# Patient Record
Sex: Female | Born: 1955
Health system: Southern US, Community
[De-identification: ages and names within clinical notes are randomized; demographics above are authoritative.]

## PROBLEM LIST (undated history)

## (undated) DIAGNOSIS — C50519 Malignant neoplasm of lower-outer quadrant of unspecified female breast: Secondary | ICD-10-CM

## (undated) DIAGNOSIS — Z87442 Personal history of urinary calculi: Secondary | ICD-10-CM

## (undated) DIAGNOSIS — C50919 Malignant neoplasm of unspecified site of unspecified female breast: Secondary | ICD-10-CM

## (undated) DIAGNOSIS — E119 Type 2 diabetes mellitus without complications: Secondary | ICD-10-CM

## (undated) DIAGNOSIS — R42 Dizziness and giddiness: Secondary | ICD-10-CM

## (undated) DIAGNOSIS — Z9882 Breast implant status: Secondary | ICD-10-CM

## (undated) DIAGNOSIS — C7800 Secondary malignant neoplasm of unspecified lung: Secondary | ICD-10-CM

## (undated) DIAGNOSIS — E7800 Pure hypercholesterolemia, unspecified: Secondary | ICD-10-CM

## (undated) DIAGNOSIS — R112 Nausea with vomiting, unspecified: Secondary | ICD-10-CM

## (undated) DIAGNOSIS — I10 Essential (primary) hypertension: Secondary | ICD-10-CM

## (undated) DIAGNOSIS — Z9889 Other specified postprocedural states: Secondary | ICD-10-CM

## (undated) DIAGNOSIS — M1990 Unspecified osteoarthritis, unspecified site: Secondary | ICD-10-CM

## (undated) HISTORY — DX: Essential (primary) hypertension: I10

## (undated) HISTORY — PX: WISDOM TOOTH EXTRACTION: SHX21

## (undated) HISTORY — PX: BREAST BIOPSY: SHX20

## (undated) HISTORY — DX: Malignant neoplasm of lower-outer quadrant of unspecified female breast: C50.519

## (undated) HISTORY — DX: Type 2 diabetes mellitus without complications: E11.9

## (undated) SURGERY — ENDOBRONCHIAL ULTRASOUND (EBUS)
Anesthesia: General

---
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
## 1997-11-21 ENCOUNTER — Ambulatory Visit (HOSPITAL_COMMUNITY): Admission: RE | Admit: 1997-11-21 | Discharge: 1997-11-21 | Payer: Self-pay | Admitting: Obstetrics and Gynecology

## 1998-10-03 ENCOUNTER — Other Ambulatory Visit: Admission: RE | Admit: 1998-10-03 | Discharge: 1998-10-03 | Payer: Self-pay | Admitting: Obstetrics and Gynecology

## 1999-11-05 ENCOUNTER — Other Ambulatory Visit: Admission: RE | Admit: 1999-11-05 | Discharge: 1999-11-05 | Payer: Self-pay | Admitting: Obstetrics and Gynecology

## 2000-01-13 ENCOUNTER — Encounter: Payer: Self-pay | Admitting: Emergency Medicine

## 2000-01-13 ENCOUNTER — Emergency Department (HOSPITAL_COMMUNITY): Admission: EM | Admit: 2000-01-13 | Discharge: 2000-01-13 | Payer: Self-pay | Admitting: Emergency Medicine

## 2000-11-18 ENCOUNTER — Other Ambulatory Visit: Admission: RE | Admit: 2000-11-18 | Discharge: 2000-11-18 | Payer: Self-pay | Admitting: Obstetrics and Gynecology

## 2002-01-18 ENCOUNTER — Other Ambulatory Visit: Admission: RE | Admit: 2002-01-18 | Discharge: 2002-01-18 | Payer: Self-pay | Admitting: Obstetrics and Gynecology

## 2003-02-26 ENCOUNTER — Other Ambulatory Visit: Admission: RE | Admit: 2003-02-26 | Discharge: 2003-02-26 | Payer: Self-pay | Admitting: Obstetrics and Gynecology

## 2004-09-14 ENCOUNTER — Other Ambulatory Visit: Admission: RE | Admit: 2004-09-14 | Discharge: 2004-09-14 | Payer: Self-pay | Admitting: Obstetrics and Gynecology

## 2005-11-17 ENCOUNTER — Ambulatory Visit: Payer: Self-pay | Admitting: Family Medicine

## 2007-09-05 ENCOUNTER — Ambulatory Visit: Payer: Self-pay | Admitting: Family Medicine

## 2008-01-05 ENCOUNTER — Ambulatory Visit: Payer: Self-pay | Admitting: Unknown Physician Specialty

## 2009-01-09 ENCOUNTER — Ambulatory Visit: Payer: Self-pay | Admitting: Family Medicine

## 2011-07-15 ENCOUNTER — Other Ambulatory Visit: Payer: Self-pay | Admitting: Obstetrics and Gynecology

## 2011-07-15 DIAGNOSIS — N63 Unspecified lump in unspecified breast: Secondary | ICD-10-CM

## 2011-07-15 DIAGNOSIS — Z803 Family history of malignant neoplasm of breast: Secondary | ICD-10-CM

## 2011-07-28 ENCOUNTER — Other Ambulatory Visit: Payer: Self-pay | Admitting: Obstetrics and Gynecology

## 2011-07-28 ENCOUNTER — Ambulatory Visit
Admission: RE | Admit: 2011-07-28 | Discharge: 2011-07-28 | Disposition: A | Discharge status: Home / Self Care | Payer: BC Managed Care – PPO | Source: Ambulatory Visit | Attending: Obstetrics and Gynecology | Admitting: Obstetrics and Gynecology

## 2011-07-28 DIAGNOSIS — N63 Unspecified lump in unspecified breast: Secondary | ICD-10-CM

## 2011-07-28 DIAGNOSIS — Z803 Family history of malignant neoplasm of breast: Secondary | ICD-10-CM

## 2011-07-29 ENCOUNTER — Other Ambulatory Visit: Payer: Self-pay | Admitting: Obstetrics and Gynecology

## 2011-07-29 ENCOUNTER — Ambulatory Visit
Admission: RE | Admit: 2011-07-29 | Discharge: 2011-07-29 | Disposition: A | Discharge status: Home / Self Care | Payer: BC Managed Care – PPO | Source: Ambulatory Visit | Attending: Obstetrics and Gynecology | Admitting: Obstetrics and Gynecology

## 2011-07-29 DIAGNOSIS — C50911 Malignant neoplasm of unspecified site of right female breast: Secondary | ICD-10-CM

## 2011-07-29 DIAGNOSIS — Z803 Family history of malignant neoplasm of breast: Secondary | ICD-10-CM

## 2011-07-29 DIAGNOSIS — N63 Unspecified lump in unspecified breast: Secondary | ICD-10-CM

## 2011-07-31 HISTORY — PX: BREAST BIOPSY: SHX20

## 2011-07-31 HISTORY — PX: PORTACATH PLACEMENT: SHX2246

## 2011-08-02 ENCOUNTER — Other Ambulatory Visit: Payer: Self-pay | Admitting: Obstetrics and Gynecology

## 2011-08-02 ENCOUNTER — Ambulatory Visit
Admission: RE | Admit: 2011-08-02 | Discharge: 2011-08-02 | Disposition: A | Discharge status: Home / Self Care | Payer: BC Managed Care – PPO | Source: Ambulatory Visit | Attending: Obstetrics and Gynecology | Admitting: Obstetrics and Gynecology

## 2011-08-02 DIAGNOSIS — R921 Mammographic calcification found on diagnostic imaging of breast: Secondary | ICD-10-CM

## 2011-08-02 DIAGNOSIS — C50911 Malignant neoplasm of unspecified site of right female breast: Secondary | ICD-10-CM

## 2011-08-02 DIAGNOSIS — R928 Other abnormal and inconclusive findings on diagnostic imaging of breast: Secondary | ICD-10-CM

## 2011-08-02 MED ORDER — GADOBENATE DIMEGLUMINE 529 MG/ML IV SOLN
16.0000 mL | Freq: Once | INTRAVENOUS | Status: AC | PRN
  Administered 2011-08-02: 16 mL via INTRAVENOUS

## 2011-08-06 ENCOUNTER — Ambulatory Visit (INDEPENDENT_AMBULATORY_CARE_PROVIDER_SITE_OTHER): Payer: BC Managed Care – PPO | Admitting: Surgery

## 2011-08-06 ENCOUNTER — Encounter (INDEPENDENT_AMBULATORY_CARE_PROVIDER_SITE_OTHER): Payer: Self-pay | Admitting: Surgery

## 2011-08-06 VITALS — BP 156/108 | HR 64 | Temp 97.9°F | Resp 18 | Ht 62.5 in | Wt 185.5 lb

## 2011-08-06 DIAGNOSIS — C50511 Malignant neoplasm of lower-outer quadrant of right female breast: Secondary | ICD-10-CM | POA: Insufficient documentation

## 2011-08-06 DIAGNOSIS — C50519 Malignant neoplasm of lower-outer quadrant of unspecified female breast: Secondary | ICD-10-CM

## 2011-08-06 DIAGNOSIS — C50919 Malignant neoplasm of unspecified site of unspecified female breast: Secondary | ICD-10-CM

## 2011-08-06 DIAGNOSIS — C50911 Malignant neoplasm of unspecified site of right female breast: Secondary | ICD-10-CM

## 2011-08-06 HISTORY — DX: Malignant neoplasm of lower-outer quadrant of unspecified female breast: C50.519

## 2011-08-06 NOTE — Progress Notes (Signed)
Patient ID: Kimberly [name], female   DOB: May 17, 1956, 55 y.o.   MRN: [id number]  Chief Complaint  Patient presents with  . Breast Cancer    new eval for right breast cancer    Referred by Dr Manson Passey and Dr Henderson Cloud  HPI Kimberly [name] is a 55 y.o. female.  She recently found a lump in the right arm pit area. She is quite worried about this. He then noted an area in the right breast near the lower midline slightly to the outer quadrant that was hard. She also felt there was some thickening of her breast. Subsequent to this she had a mammogram which shows an abnormality that looks malignant. The lower outer quadrant and an axillary mass. These have both been biopsied and are invasive ductal carcinoma, receptor positive, HER-2/neu negative. Other than that she's had no other prior breast problems. Her mother was treated for breast cancer has recently died apparently of some hematological complications of chemotherapy. She comes in today to evaluate wait and discuss possible treatment options. If she needs to have chemotherapy or radiation she wishes to do that near her home and [facility] [place] HPI  Past Medical History  Diagnosis Date  . Diabetes mellitus   . Hypertension   . Cancer of lower-outer quadrant of female breast 08/06/2011    Past Surgical History  Procedure Date  . Cesarean section [id number]  . Wisdom tooth extraction     History reviewed. No pertinent family history.  Social History History  Substance Use Topics  . Smoking status: Never Smoker   . Smokeless tobacco: Never Used  . Alcohol Use: No    Allergies  Allergen Reactions  . Sulfa Antibiotics Rash    Current Outpatient Prescriptions  Medication Sig Dispense Refill  . ALPRAZolam (XANAX) 0.25 MG tablet         Review of Systems Review of Systems  Constitutional: Negative for fever, chills and unexpected weight change.  HENT: Negative for hearing loss, congestion, sore throat, trouble swallowing and voice  change.   Eyes: Negative for visual disturbance.  Respiratory: Positive for cough. Negative for wheezing.   Cardiovascular: Negative for chest pain, palpitations and leg swelling.  Gastrointestinal: Negative for nausea, vomiting, abdominal pain, diarrhea, constipation, blood in stool, abdominal distention and anal bleeding.  Genitourinary: Negative for hematuria, vaginal bleeding and difficulty urinating.  Musculoskeletal: Negative for arthralgias.  Skin: Negative for rash and wound.  Neurological: Negative for seizures, syncope and headaches.  Hematological: Positive for adenopathy. Does not bruise/bleed easily.  Psychiatric/Behavioral: Negative for confusion.    Blood pressure 156/108, pulse 64, temperature 97.9 F (36.6 C), temperature source Temporal, resp. rate 18, height 5' 2.5" (1.588 m), weight 185 lb 8 oz (84.142 kg).  Physical Exam Physical Exam  Constitutional: She is oriented to person, place, and time. She appears well-developed and well-nourished. No distress.  HENT:  Head: Normocephalic and atraumatic.  Mouth/Throat: Oropharynx is clear and moist.  Eyes: EOM are normal. Pupils are equal, round, and reactive to light. Right eye exhibits no discharge. No scleral icterus.  Neck: Normal range of motion. Neck supple. No JVD present. No tracheal deviation present. No thyromegaly present.  Cardiovascular: Normal rate, regular rhythm, normal heart sounds and intact distal pulses.  Exam reveals no gallop and no friction rub.   No murmur heard. Pulmonary/Chest: Effort normal and breath sounds normal. No respiratory distress. She has no wheezes. She has no rales.  Abdominal: Soft. Bowel sounds are normal. She exhibits no distension and no  mass. There is no tenderness. There is no rebound and no guarding.  Musculoskeletal: She exhibits no edema and no tenderness.  Neurological: She is alert and oriented to person, place, and time.  Skin: Skin is dry. She is not diaphoretic. No  erythema. No pallor.  Psychiatric: She has a normal mood and affect. Her behavior is normal.  Breast: There is mild edema of the right breast but no erythema. There is a 4cm hard mass about the 7:30 position of the right. Otherwise the right is normal as is the left: Lymphatics: There is a hard mass in the right anterior axilla. There is no left axillary or bilateral supraclavicular adenopathy  Data Reviewed I reviewed the mammogram films and reports with the radiologist as well as the MRI films and reports. I have reviewed the pathology slides with the pathologist.  Assessment   Clinical Stage II Right breast cancer, receptor Positive Family history of breast cancer     Plan    I had a long talk with the patient and her family. I think that she has at least a stage II possibly stage III right breast cancer. It was receptor positive. I've explained to her the diagnosis and implications. We have talked about the possible it for genetic counseling since her mother had breast cancer. We also talked at length about lumpectomy mastectomy axillary dissections plastic surgical reconstruction  and preoperative chemotherapy.  I think it would be appropriate to obtain both preoperative chemotherapy and radiation oncology appointments. We will also try to arrange for surgical consultation for possible immediate reconstruction. I will also check to see about getting genetic counseling done.       Jose Corvin J 08/06/2011, 10:11 AM

## 2011-08-06 NOTE — Patient Instructions (Signed)
We will try to arrange for both a medical and the radiation therapy oncology visit as soon as we can. We will also arrange for her plastic surgeon evaluation. We will check to see if a genetic counselor is available locally or whether you will need to come here for one.  Please call my office if you have any questions or concerns about scheduling these tests and consultations

## 2011-08-12 ENCOUNTER — Ambulatory Visit: Payer: Self-pay | Admitting: Internal Medicine

## 2011-08-12 ENCOUNTER — Ambulatory Visit: Payer: Self-pay | Admitting: Oncology

## 2011-08-13 ENCOUNTER — Telehealth (INDEPENDENT_AMBULATORY_CARE_PROVIDER_SITE_OTHER): Payer: Self-pay | Admitting: Surgery

## 2011-08-13 ENCOUNTER — Other Ambulatory Visit (INDEPENDENT_AMBULATORY_CARE_PROVIDER_SITE_OTHER): Payer: Self-pay | Admitting: Surgery

## 2011-08-13 DIAGNOSIS — C50911 Malignant neoplasm of unspecified site of right female breast: Secondary | ICD-10-CM

## 2011-08-13 NOTE — Telephone Encounter (Signed)
Spoke with the radiation oncologist and plans are for neo-adjuvant chemo, so will hold on surgery unless patient decides differently

## 2011-08-17 ENCOUNTER — Ambulatory Visit: Payer: Self-pay | Admitting: Oncology

## 2011-08-20 ENCOUNTER — Ambulatory Visit: Payer: Self-pay | Admitting: Surgery

## 2011-08-23 ENCOUNTER — Ambulatory Visit: Payer: Self-pay | Admitting: Surgery

## 2011-08-31 ENCOUNTER — Ambulatory Visit: Payer: Self-pay | Admitting: Internal Medicine

## 2011-08-31 ENCOUNTER — Ambulatory Visit: Payer: Self-pay | Admitting: Oncology

## 2011-09-03 LAB — CBC CANCER CENTER
Basophil #: 0.1 x10 3/mm (ref 0.0–0.1)
Basophil %: 2.4 %
Eosinophil #: 0.2 x10 3/mm (ref 0.0–0.7)
Lymphocyte #: 1.1 x10 3/mm (ref 1.0–3.6)
MCHC: 33.1 g/dL (ref 32.0–36.0)
MCV: 85.9 fL (ref 80–100)
Monocyte #: 0.3 x10 3/mm (ref 0.0–0.7)
Neutrophil %: 32.8 %
Platelet: 313 x10 3/mm (ref 150–440)
RBC: 4.21 10*6/uL (ref 3.80–5.20)
RDW: 12.4 % (ref 11.5–14.5)
WBC: 2.6 x10 3/mm — ABNORMAL LOW (ref 3.6–11.0)

## 2011-09-10 LAB — CBC CANCER CENTER
Basophil #: 0.1 x10 3/mm (ref 0.0–0.1)
Eosinophil #: 0 x10 3/mm (ref 0.0–0.7)
Eosinophil %: 0.3 %
Lymphocyte #: 1.8 x10 3/mm (ref 1.0–3.6)
Lymphocyte %: 14 %
MCV: 86 fL (ref 80–100)
Monocyte #: 0.6 x10 3/mm (ref 0.0–0.7)
Monocyte %: 4.8 %
Neutrophil #: 10.2 x10 3/mm — ABNORMAL HIGH (ref 1.4–6.5)
Neutrophil %: 80.4 %
Platelet: 378 x10 3/mm (ref 150–440)
RBC: 4.19 10*6/uL (ref 3.80–5.20)
RDW: 13.1 % (ref 11.5–14.5)
WBC: 12.7 x10 3/mm — ABNORMAL HIGH (ref 3.6–11.0)

## 2011-09-10 LAB — BASIC METABOLIC PANEL
Anion Gap: 8 (ref 7–16)
BUN: 11 mg/dL (ref 7–18)
Calcium, Total: 8.6 mg/dL (ref 8.5–10.1)
Chloride: 98 mmol/L (ref 98–107)
Creatinine: 0.85 mg/dL (ref 0.60–1.30)
EGFR (African American): >60
EGFR (Non-African Amer.): >60
Glucose: 285 mg/dL — ABNORMAL HIGH (ref 65–99)
Osmolality: 278 (ref 275–301)

## 2011-09-17 LAB — CBC CANCER CENTER
Basophil #: 0.1 x10 3/mm (ref 0.0–0.1)
Basophil %: 4.7 %
Eosinophil #: 0 x10 3/mm (ref 0.0–0.7)
HCT: 35.8 % (ref 35.0–47.0)
Lymphocyte #: 0.7 x10 3/mm — ABNORMAL LOW (ref 1.0–3.6)
Lymphocyte %: 51.2 %
MCHC: 33.5 g/dL (ref 32.0–36.0)
MCV: 85.7 fL (ref 80–100)
Neutrophil #: 0.5 x10 3/mm — ABNORMAL LOW (ref 1.4–6.5)
Platelet: 369 x10 3/mm (ref 150–440)
RDW: 12.6 % (ref 11.5–14.5)
WBC: 1.5 x10 3/mm — CL (ref 3.6–11.0)

## 2011-09-24 LAB — BASIC METABOLIC PANEL
Anion Gap: 11 (ref 7–16)
Calcium, Total: 8.9 mg/dL (ref 8.5–10.1)
Co2: 26 mmol/L (ref 21–32)
EGFR (Non-African Amer.): >60
Glucose: 248 mg/dL — ABNORMAL HIGH (ref 65–99)
Osmolality: 281 (ref 275–301)
Sodium: 136 mmol/L (ref 136–145)

## 2011-09-24 LAB — CBC CANCER CENTER
Basophil #: 0 x10 3/mm (ref 0.0–0.1)
Basophil %: 0.2 %
Eosinophil #: 0 x10 3/mm (ref 0.0–0.7)
HGB: 11.8 g/dL — ABNORMAL LOW (ref 12.0–16.0)
Lymphocyte #: 1.6 x10 3/mm (ref 1.0–3.6)
MCH: 29 pg (ref 26.0–34.0)
MCHC: 33.7 g/dL (ref 32.0–36.0)
MCV: 86.1 fL (ref 80–100)
Monocyte #: 1 x10 3/mm — ABNORMAL HIGH (ref 0.0–0.7)
Neutrophil %: 83.7 %
Platelet: 321 x10 3/mm (ref 150–440)
RBC: 4.05 10*6/uL (ref 3.80–5.20)
RDW: 13.3 % (ref 11.5–14.5)
WBC: 16 x10 3/mm — ABNORMAL HIGH (ref 3.6–11.0)

## 2011-09-29 LAB — URINALYSIS, COMPLETE
Bilirubin,UR: NEGATIVE
Glucose,UR: 1000 mg/dL (ref 0–75)
Nitrite: POSITIVE
Protein: NEGATIVE

## 2011-09-30 LAB — CBC CANCER CENTER
Basophil #: 0 x10 3/mm (ref 0.0–0.1)
Eosinophil #: 0 x10 3/mm (ref 0.0–0.7)
Lymphocyte %: 4.2 %
Neutrophil %: 93.2 %
Platelet: 362 x10 3/mm (ref 150–440)
RBC: 4.02 10*6/uL (ref 3.80–5.20)
RDW: 14.4 % (ref 11.5–14.5)
WBC: 5.8 x10 3/mm (ref 3.6–11.0)

## 2011-09-30 LAB — BASIC METABOLIC PANEL
Anion Gap: 13 (ref 7–16)
BUN: 14 mg/dL (ref 7–18)
Calcium, Total: 8.3 mg/dL — ABNORMAL LOW (ref 8.5–10.1)
Chloride: 96 mmol/L — ABNORMAL LOW (ref 98–107)
Co2: 25 mmol/L (ref 21–32)
Creatinine: 0.95 mg/dL (ref 0.60–1.30)
EGFR (African American): >60
Potassium: 4.2 mmol/L (ref 3.5–5.1)
Sodium: 134 mmol/L — ABNORMAL LOW (ref 136–145)

## 2011-10-01 ENCOUNTER — Ambulatory Visit: Payer: Self-pay | Admitting: Internal Medicine

## 2011-10-01 ENCOUNTER — Ambulatory Visit: Payer: Self-pay | Admitting: Oncology

## 2011-10-01 LAB — COMPREHENSIVE METABOLIC PANEL
Albumin: 3.3 g/dL — ABNORMAL LOW (ref 3.4–5.0)
Alkaline Phosphatase: 144 U/L — ABNORMAL HIGH (ref 50–136)
Calcium, Total: 8.7 mg/dL (ref 8.5–10.1)
Co2: 26 mmol/L (ref 21–32)
EGFR (Non-African Amer.): >60
Glucose: 281 mg/dL — ABNORMAL HIGH (ref 65–99)
Osmolality: 282 (ref 275–301)
SGOT(AST): 11 U/L — ABNORMAL LOW (ref 15–37)
SGPT (ALT): 17 U/L
Sodium: 136 mmol/L (ref 136–145)
Total Protein: 6.5 g/dL (ref 6.4–8.2)

## 2011-10-01 LAB — CBC CANCER CENTER
Basophil #: 0 x10 3/mm (ref 0.0–0.1)
Basophil %: 0.6 %
Eosinophil #: 0 x10 3/mm (ref 0.0–0.7)
Eosinophil %: 0.2 %
HGB: 10 g/dL — ABNORMAL LOW (ref 12.0–16.0)
Lymphocyte #: 0.1 x10 3/mm — ABNORMAL LOW (ref 1.0–3.6)
Lymphocyte %: 3.1 %
MCH: 29.6 pg (ref 26.0–34.0)
MCHC: 34.7 g/dL (ref 32.0–36.0)
MCV: 85.2 fL (ref 80–100)
Monocyte #: 0.2 x10 3/mm (ref 0.0–0.7)
Monocyte %: 4.5 %
Platelet: 281 x10 3/mm (ref 150–440)
RBC: 3.39 10*6/uL — ABNORMAL LOW (ref 3.80–5.20)

## 2011-10-02 LAB — URINE CULTURE

## 2011-10-08 LAB — BASIC METABOLIC PANEL
Anion Gap: 12 (ref 7–16)
BUN: 9 mg/dL (ref 7–18)
Calcium, Total: 8.7 mg/dL (ref 8.5–10.1)
Chloride: 102 mmol/L (ref 98–107)
Co2: 24 mmol/L (ref 21–32)
Potassium: 3.8 mmol/L (ref 3.5–5.1)
Sodium: 138 mmol/L (ref 136–145)

## 2011-10-08 LAB — CBC CANCER CENTER
Basophil #: 0 x10 3/mm (ref 0.0–0.1)
Basophil %: 0.1 %
Eosinophil #: 0 x10 3/mm (ref 0.0–0.7)
Eosinophil %: 0.1 %
HCT: 32.5 % — ABNORMAL LOW (ref 35.0–47.0)
Lymphocyte %: 10.2 %
MCH: 29.3 pg (ref 26.0–34.0)
MCHC: 33.7 g/dL (ref 32.0–36.0)
MCV: 87 fL (ref 80–100)
Monocyte %: 5.3 %
Neutrophil #: 13.5 x10 3/mm — ABNORMAL HIGH (ref 1.4–6.5)
Neutrophil %: 84.3 %
Platelet: 392 x10 3/mm (ref 150–440)
RBC: 3.74 10*6/uL — ABNORMAL LOW (ref 3.80–5.20)
WBC: 16 x10 3/mm — ABNORMAL HIGH (ref 3.6–11.0)

## 2011-10-15 LAB — CBC CANCER CENTER
Basophil #: 0 x10 3/mm (ref 0.0–0.1)
Eosinophil #: 0 x10 3/mm (ref 0.0–0.7)
Eosinophil %: 0.9 %
HCT: 31.6 % — ABNORMAL LOW (ref 35.0–47.0)
Lymphocyte %: 26.4 %
MCHC: 33.5 g/dL (ref 32.0–36.0)
Monocyte %: 9 %
Neutrophil #: 1 x10 3/mm — ABNORMAL LOW (ref 1.4–6.5)
Platelet: 268 x10 3/mm (ref 150–440)
RBC: 3.63 10*6/uL — ABNORMAL LOW (ref 3.80–5.20)
RDW: 15.6 % — ABNORMAL HIGH (ref 11.5–14.5)
WBC: 1.5 x10 3/mm — CL (ref 3.6–11.0)

## 2011-10-18 LAB — URINALYSIS, COMPLETE
Bilirubin,UR: NEGATIVE
Glucose,UR: NEGATIVE mg/dL
Ketone: NEGATIVE
Leukocyte Esterase: NEGATIVE
Nitrite: NEGATIVE
Ph: 6
Specific Gravity: >=1.03

## 2011-10-20 LAB — URINE CULTURE

## 2011-10-22 LAB — CBC CANCER CENTER
Basophil %: 0.3 %
Eosinophil %: 0.1 %
HCT: 31.8 % — ABNORMAL LOW (ref 35.0–47.0)
Lymphocyte #: 0.9 x10 3/mm — ABNORMAL LOW (ref 1.0–3.6)
MCH: 29.5 pg (ref 26.0–34.0)
MCHC: 33.7 g/dL (ref 32.0–36.0)
MCV: 87.6 fL (ref 80–100)
Monocyte #: 1.6 x10 3/mm — ABNORMAL HIGH (ref 0.0–0.7)
Monocyte %: 7.5 %
Neutrophil #: 18.1 x10 3/mm — ABNORMAL HIGH (ref 1.4–6.5)
Platelet: 315 x10 3/mm (ref 150–440)
RDW: 16.9 % — ABNORMAL HIGH (ref 11.5–14.5)
WBC: 20.7 x10 3/mm — ABNORMAL HIGH (ref 3.6–11.0)

## 2011-10-29 ENCOUNTER — Ambulatory Visit: Payer: Self-pay | Admitting: Internal Medicine

## 2011-10-29 ENCOUNTER — Ambulatory Visit: Payer: Self-pay | Admitting: Oncology

## 2011-10-29 LAB — CBC CANCER CENTER
Basophil #: 0.1 x10 3/mm (ref 0.0–0.1)
Eosinophil #: 0 x10 3/mm (ref 0.0–0.7)
Eosinophil %: 0.4 %
Lymphocyte %: 14.5 %
Monocyte %: 8 %
Neutrophil #: 4 x10 3/mm (ref 1.4–6.5)
Neutrophil %: 75.1 %
Platelet: 578 x10 3/mm — ABNORMAL HIGH (ref 150–440)
RBC: 3.45 10*6/uL — ABNORMAL LOW (ref 3.80–5.20)
RDW: 17.4 % — ABNORMAL HIGH (ref 11.5–14.5)
WBC: 5.2 x10 3/mm (ref 3.6–11.0)

## 2011-11-05 LAB — CBC CANCER CENTER
Basophil #: 0.1 x10 3/mm (ref 0.0–0.1)
Basophil %: 2.1 %
Eosinophil #: 0 x10 3/mm (ref 0.0–0.7)
Eosinophil %: 0.7 %
HCT: 31.6 % — ABNORMAL LOW (ref 35.0–47.0)
Lymphocyte #: 0.7 x10 3/mm — ABNORMAL LOW (ref 1.0–3.6)
MCH: 30.3 pg (ref 26.0–34.0)
MCV: 89.4 fL (ref 80–100)
Monocyte #: 0.3 x10 3/mm (ref 0.0–0.7)
Platelet: 452 x10 3/mm — ABNORMAL HIGH (ref 150–440)
RDW: 18.4 % — ABNORMAL HIGH (ref 11.5–14.5)

## 2011-11-12 LAB — CBC CANCER CENTER
Basophil #: 0.1 x10 3/mm (ref 0.0–0.1)
Eosinophil #: 0.1 x10 3/mm (ref 0.0–0.7)
Eosinophil %: 3.6 %
HCT: 29.9 % — ABNORMAL LOW (ref 35.0–47.0)
Lymphocyte %: 26.6 %
MCHC: 33.8 g/dL (ref 32.0–36.0)
Neutrophil #: 1.7 x10 3/mm (ref 1.4–6.5)
Neutrophil %: 57.4 %
RBC: 3.3 10*6/uL — ABNORMAL LOW (ref 3.80–5.20)
RDW: 18.3 % — ABNORMAL HIGH (ref 11.5–14.5)

## 2011-11-19 LAB — CBC CANCER CENTER
Basophil %: 2.3 %
Eosinophil #: 0.1 x10 3/mm (ref 0.0–0.7)
Eosinophil %: 2.9 %
HCT: 28.7 % — ABNORMAL LOW (ref 35.0–47.0)
HGB: 9.7 g/dL — ABNORMAL LOW (ref 12.0–16.0)
Lymphocyte #: 0.6 x10 3/mm — ABNORMAL LOW (ref 1.0–3.6)
Lymphocyte %: 31.9 %
Monocyte %: 16 %
Neutrophil #: 1 x10 3/mm — ABNORMAL LOW (ref 1.4–6.5)
RBC: 3.15 10*6/uL — ABNORMAL LOW (ref 3.80–5.20)
RDW: 17.5 % — ABNORMAL HIGH (ref 11.5–14.5)
WBC: 2 x10 3/mm — CL (ref 3.6–11.0)

## 2011-11-29 ENCOUNTER — Ambulatory Visit: Payer: Self-pay | Admitting: Oncology

## 2011-11-29 ENCOUNTER — Ambulatory Visit: Payer: Self-pay | Admitting: Internal Medicine

## 2011-12-03 LAB — CBC CANCER CENTER
Basophil #: 0 x10 3/mm (ref 0.0–0.1)
Basophil %: 0.3 %
HCT: 33.2 % — ABNORMAL LOW (ref 35.0–47.0)
Lymphocyte %: 17.9 %
MCH: 30.1 pg (ref 26.0–34.0)
MCHC: 33.1 g/dL (ref 32.0–36.0)
Neutrophil #: 8.4 x10 3/mm — ABNORMAL HIGH (ref 1.4–6.5)
Neutrophil %: 75.1 %
Platelet: 589 x10 3/mm — ABNORMAL HIGH (ref 150–440)
RDW: 16.3 % — ABNORMAL HIGH (ref 11.5–14.5)
WBC: 11.1 x10 3/mm — ABNORMAL HIGH (ref 3.6–11.0)

## 2011-12-03 LAB — COMPREHENSIVE METABOLIC PANEL
Calcium, Total: 9.3 mg/dL (ref 8.5–10.1)
Chloride: 102 mmol/L (ref 98–107)
Co2: 25 mmol/L (ref 21–32)
Creatinine: 0.94 mg/dL (ref 0.60–1.30)
EGFR (African American): >60
Glucose: 196 mg/dL — ABNORMAL HIGH (ref 65–99)
Potassium: 4.2 mmol/L (ref 3.5–5.1)
SGPT (ALT): 26 U/L
Sodium: 137 mmol/L (ref 136–145)

## 2011-12-10 LAB — CBC CANCER CENTER
Basophil #: 0.1 x10 3/mm (ref 0.0–0.1)
Eosinophil %: 2.7 %
HCT: 31.7 % — ABNORMAL LOW (ref 35.0–47.0)
HGB: 10.8 g/dL — ABNORMAL LOW (ref 12.0–16.0)
Lymphocyte %: 22.2 %
MCHC: 34.1 g/dL (ref 32.0–36.0)
MCV: 91 fL (ref 80–100)
Monocyte #: 0.3 x10 3/mm (ref 0.2–0.9)
Monocyte %: 5 %
Neutrophil %: 68.2 %

## 2011-12-10 LAB — COMPREHENSIVE METABOLIC PANEL
Anion Gap: 7 (ref 7–16)
BUN: 10 mg/dL (ref 7–18)
Bilirubin,Total: 0.2 mg/dL (ref 0.2–1.0)
Creatinine: 0.7 mg/dL (ref 0.60–1.30)
EGFR (African American): >60
EGFR (Non-African Amer.): >60
Glucose: 160 mg/dL — ABNORMAL HIGH (ref 65–99)
Osmolality: 267 (ref 275–301)
Potassium: 4.3 mmol/L (ref 3.5–5.1)
Total Protein: 6.8 g/dL (ref 6.4–8.2)

## 2011-12-17 LAB — CBC CANCER CENTER
Basophil #: 0.1 x10 3/mm (ref 0.0–0.1)
Basophil %: 1.3 %
Eosinophil #: 0.1 x10 3/mm (ref 0.0–0.7)
Eosinophil %: 2.4 %
HCT: 32.3 % — ABNORMAL LOW (ref 35.0–47.0)
MCHC: 33.3 g/dL (ref 32.0–36.0)
MCV: 91 fL (ref 80–100)
Monocyte #: 0.3 x10 3/mm (ref 0.2–0.9)
Platelet: 418 x10 3/mm (ref 150–440)
RBC: 3.54 10*6/uL — ABNORMAL LOW (ref 3.80–5.20)

## 2011-12-17 LAB — COMPREHENSIVE METABOLIC PANEL
Alkaline Phosphatase: 85 U/L (ref 50–136)
BUN: 14 mg/dL (ref 7–18)
Bilirubin,Total: 0.2 mg/dL (ref 0.2–1.0)
Calcium, Total: 8.6 mg/dL (ref 8.5–10.1)
Chloride: 97 mmol/L — ABNORMAL LOW (ref 98–107)
Co2: 23 mmol/L (ref 21–32)
Creatinine: 0.79 mg/dL (ref 0.60–1.30)
EGFR (African American): >60
EGFR (Non-African Amer.): >60
Glucose: 168 mg/dL — ABNORMAL HIGH (ref 65–99)
Potassium: 4 mmol/L (ref 3.5–5.1)
SGOT(AST): 21 U/L (ref 15–37)
Sodium: 131 mmol/L — ABNORMAL LOW (ref 136–145)
Total Protein: 6.9 g/dL (ref 6.4–8.2)

## 2011-12-24 LAB — CBC CANCER CENTER
Basophil #: 0.1 x10 3/mm (ref 0.0–0.1)
Eosinophil #: 0.1 x10 3/mm (ref 0.0–0.7)
HCT: 32.5 % — ABNORMAL LOW (ref 35.0–47.0)
MCH: 31 pg (ref 26.0–34.0)
MCHC: 33.8 g/dL (ref 32.0–36.0)
Monocyte #: 0.2 x10 3/mm (ref 0.2–0.9)
Monocyte %: 5 %
Neutrophil #: 3.2 x10 3/mm (ref 1.4–6.5)
Neutrophil %: 68.2 %
Platelet: 420 x10 3/mm (ref 150–440)
RBC: 3.54 10*6/uL — ABNORMAL LOW (ref 3.80–5.20)
RDW: 16.3 % — ABNORMAL HIGH (ref 11.5–14.5)

## 2011-12-24 LAB — COMPREHENSIVE METABOLIC PANEL
Albumin: 3.6 g/dL (ref 3.4–5.0)
BUN: 13 mg/dL (ref 7–18)
Bilirubin,Total: 0.2 mg/dL (ref 0.2–1.0)
Calcium, Total: 9 mg/dL (ref 8.5–10.1)
Chloride: 100 mmol/L (ref 98–107)
Co2: 25 mmol/L (ref 21–32)
Creatinine: 0.85 mg/dL (ref 0.60–1.30)
Osmolality: 278 (ref 275–301)
Potassium: 4.1 mmol/L (ref 3.5–5.1)
Total Protein: 6.8 g/dL (ref 6.4–8.2)

## 2011-12-29 ENCOUNTER — Ambulatory Visit: Payer: Self-pay | Admitting: Internal Medicine

## 2011-12-29 ENCOUNTER — Ambulatory Visit: Payer: Self-pay | Admitting: Oncology

## 2011-12-31 LAB — CBC CANCER CENTER
Basophil %: 2.1 %
HCT: 33.9 % — ABNORMAL LOW (ref 35.0–47.0)
Lymphocyte #: 1.3 x10 3/mm (ref 1.0–3.6)
MCH: 30.6 pg (ref 26.0–34.0)
MCV: 91 fL (ref 80–100)
Monocyte #: 0.3 x10 3/mm (ref 0.2–0.9)
Monocyte %: 5.9 %
Neutrophil #: 2.7 x10 3/mm (ref 1.4–6.5)
RDW: 16.3 % — ABNORMAL HIGH (ref 11.5–14.5)
WBC: 4.4 x10 3/mm (ref 3.6–11.0)

## 2011-12-31 LAB — COMPREHENSIVE METABOLIC PANEL
Anion Gap: 11 (ref 7–16)
Co2: 25 mmol/L (ref 21–32)
Creatinine: 0.73 mg/dL (ref 0.60–1.30)
EGFR (African American): >60
EGFR (Non-African Amer.): >60
Glucose: 181 mg/dL — ABNORMAL HIGH (ref 65–99)
Osmolality: 274 (ref 275–301)
Potassium: 4.3 mmol/L (ref 3.5–5.1)
SGOT(AST): 15 U/L (ref 15–37)
Sodium: 135 mmol/L — ABNORMAL LOW (ref 136–145)
Total Protein: 6.8 g/dL (ref 6.4–8.2)

## 2012-01-07 LAB — COMPREHENSIVE METABOLIC PANEL
BUN: 10 mg/dL (ref 7–18)
Calcium, Total: 8.8 mg/dL (ref 8.5–10.1)
Chloride: 101 mmol/L (ref 98–107)
Creatinine: 0.7 mg/dL (ref 0.60–1.30)
EGFR (African American): >60
EGFR (Non-African Amer.): >60
Osmolality: 274 (ref 275–301)
SGOT(AST): 18 U/L (ref 15–37)
SGPT (ALT): 29 U/L
Sodium: 136 mmol/L (ref 136–145)
Total Protein: 6.4 g/dL (ref 6.4–8.2)

## 2012-01-07 LAB — CBC CANCER CENTER
Basophil %: 1.9 %
Eosinophil #: 0.1 x10 3/mm (ref 0.0–0.7)
Eosinophil %: 1.4 %
Lymphocyte #: 1.2 x10 3/mm (ref 1.0–3.6)
Lymphocyte %: 27.6 %
MCH: 30.7 pg (ref 26.0–34.0)
MCHC: 33.8 g/dL (ref 32.0–36.0)
MCV: 91 fL (ref 80–100)
Neutrophil #: 2.9 x10 3/mm (ref 1.4–6.5)
Neutrophil %: 63.5 %
RDW: 15.9 % — ABNORMAL HIGH (ref 11.5–14.5)

## 2012-01-09 LAB — CANCER ANTIGEN 27.29: CA 27.29: 21.5 U/mL (ref 0.0–38.6)

## 2012-01-14 LAB — COMPREHENSIVE METABOLIC PANEL
Albumin: 3.4 g/dL (ref 3.4–5.0)
Alkaline Phosphatase: 85 U/L (ref 50–136)
Anion Gap: 11 (ref 7–16)
BUN: 9 mg/dL (ref 7–18)
Calcium, Total: 8.5 mg/dL (ref 8.5–10.1)
Co2: 25 mmol/L (ref 21–32)
Creatinine: 0.79 mg/dL (ref 0.60–1.30)
EGFR (African American): >60
Glucose: 168 mg/dL — ABNORMAL HIGH (ref 65–99)
Osmolality: 275 (ref 275–301)
Potassium: 4.2 mmol/L (ref 3.5–5.1)
Total Protein: 6.6 g/dL (ref 6.4–8.2)

## 2012-01-14 LAB — CBC CANCER CENTER
Basophil #: 0.1 x10 3/mm (ref 0.0–0.1)
Eosinophil #: 0 x10 3/mm (ref 0.0–0.7)
Eosinophil %: 1.1 %
HCT: 32.3 % — ABNORMAL LOW (ref 35.0–47.0)
HGB: 10.9 g/dL — ABNORMAL LOW (ref 12.0–16.0)
MCHC: 33.8 g/dL (ref 32.0–36.0)
Monocyte #: 0.3 x10 3/mm (ref 0.2–0.9)
Neutrophil #: 2.7 x10 3/mm (ref 1.4–6.5)
Neutrophil %: 62.8 %
RDW: 16.4 % — ABNORMAL HIGH (ref 11.5–14.5)
WBC: 4.3 x10 3/mm (ref 3.6–11.0)

## 2012-01-20 LAB — URINALYSIS, COMPLETE
Bilirubin,UR: NEGATIVE
Ketone: NEGATIVE
Protein: NEGATIVE

## 2012-01-20 LAB — COMPREHENSIVE METABOLIC PANEL
Anion Gap: 8 (ref 7–16)
EGFR (Non-African Amer.): >60
Osmolality: 277 (ref 275–301)
Potassium: 4.7 mmol/L (ref 3.5–5.1)
SGOT(AST): 19 U/L (ref 15–37)
Sodium: 136 mmol/L (ref 136–145)

## 2012-01-20 LAB — CBC CANCER CENTER
Basophil #: 0.1 x10 3/mm (ref 0.0–0.1)
Basophil %: 1.6 %
Eosinophil #: 0 x10 3/mm (ref 0.0–0.7)
HCT: 36.1 % (ref 35.0–47.0)
HGB: 12.2 g/dL (ref 12.0–16.0)
Lymphocyte #: 1.1 x10 3/mm (ref 1.0–3.6)
Lymphocyte %: 33.8 %
MCHC: 33.8 g/dL (ref 32.0–36.0)
Monocyte #: 0.2 x10 3/mm (ref 0.2–0.9)
Monocyte %: 5.7 %
Neutrophil #: 2 x10 3/mm (ref 1.4–6.5)
RBC: 3.97 10*6/uL (ref 3.80–5.20)
RDW: 16.6 % — ABNORMAL HIGH (ref 11.5–14.5)
WBC: 3.4 x10 3/mm — ABNORMAL LOW (ref 3.6–11.0)

## 2012-01-22 LAB — URINE CULTURE

## 2012-01-27 ENCOUNTER — Ambulatory Visit (INDEPENDENT_AMBULATORY_CARE_PROVIDER_SITE_OTHER): Payer: BC Managed Care – PPO | Admitting: Surgery

## 2012-01-27 ENCOUNTER — Encounter (INDEPENDENT_AMBULATORY_CARE_PROVIDER_SITE_OTHER): Payer: Self-pay | Admitting: Surgery

## 2012-01-27 VITALS — BP 136/83 | HR 132 | Temp 97.6°F | Ht 62.0 in | Wt 195.2 lb

## 2012-01-27 DIAGNOSIS — C50519 Malignant neoplasm of lower-outer quadrant of unspecified female breast: Secondary | ICD-10-CM

## 2012-01-27 NOTE — Progress Notes (Signed)
Chief complaint: Followup after neoadjuvant chemotherapy  History of present illness: This patient presented in December with a stage II invasive ductal carcinoma of the right breast receptor positive. A port was placed and she is undergoing chemotherapy which she has now completed. She has a strong family history of breast cancer and because of the large size of her primary, and positive nodes, wishes to have a right aspect and with x-ray node dissection for her treatment for right breast cancer. In addition she very much desires a prophylactic left mastectomy again because of her family history and concerned about the development of a new cancer.  Past history family history review of systems are basically unchanged from her original visit. Of note is that she tolerated the chemotherapy well. She did have a urinary tract infection but that completely resolved.  Exam Vital signs:BP 136/83  Pulse 132  Temp(Src) 97.6 F (36.4 C) (Temporal)  Ht 5\' 2"  (1.575 m)  Wt 195 lb 3.2 oz (88.542 kg)  BMI 35.70 kg/m2  SpO2 97%  General: Patient alert awake oriented healthy-appearing GENERAL:  The patient is alert, oriented, and generally healthy-appearing, NAD. Mood and affect are normal.  HEENT:  The head is normocephalic, the eyes nonicteric, the pupils were round regular and equal. EOMs are normal. Pharynx normal. Dentition good.  NECK:  The neck is supple and there are no masses or thyromegaly.  LUNGS: Normal respirations and clear to auscultation.  HEART: Regular rhythm, with no murmurs rubs or gallops. Pulses are intact carotid dorsalis pedis and posterior tibial. No significant varicosities are noted.  BREASTS: No mass and no abnormality noted today  ABDOMEN: Soft, flat, and nontender. No masses or organomegaly is noted. No hernias are noted. Bowel sounds are normal.  EXTREMITIES:  Good range of motion, no edema.  Data Reviewed: Oncology notes  Imp: Good response to her  chemo  Plan: She wishes a right mastectomy so we discussed that including risks and complicatons and need for drains - and that Lymph nodes will need to be removed. She knows she will need to defer reconstruction. She also wants prophylactic mastectomy and this could have immediate reconstruction. I spoke with Dr Odis Luster and that had been the initial plan, defer Right reconstrauction but place tissue expander at this surgery for the left.  We will try to set this up for two or three weeks from now.

## 2012-01-29 ENCOUNTER — Ambulatory Visit: Payer: Self-pay | Admitting: Oncology

## 2012-01-29 ENCOUNTER — Ambulatory Visit: Payer: Self-pay | Admitting: Internal Medicine

## 2012-02-24 ENCOUNTER — Encounter (HOSPITAL_COMMUNITY): Payer: Self-pay | Admitting: Pharmacy Technician

## 2012-02-29 ENCOUNTER — Encounter (HOSPITAL_COMMUNITY)
Admission: RE | Admit: 2012-02-29 | Discharge: 2012-02-29 | Disposition: A | Discharge status: Home / Self Care | Payer: BC Managed Care – PPO | Source: Ambulatory Visit | Attending: Surgery | Admitting: Surgery

## 2012-02-29 ENCOUNTER — Encounter (HOSPITAL_COMMUNITY): Payer: Self-pay

## 2012-02-29 LAB — CBC
HCT: 35.2 % — ABNORMAL LOW (ref 36.0–46.0)
Hemoglobin: 11.7 g/dL — ABNORMAL LOW (ref 12.0–15.0)
MCHC: 33.2 g/dL (ref 30.0–36.0)
RBC: 4.06 MIL/uL (ref 3.87–5.11)

## 2012-02-29 LAB — BASIC METABOLIC PANEL
BUN: 10 mg/dL (ref 6–23)
Chloride: 100 mEq/L (ref 96–112)
GFR calc Af Amer: >90 mL/min (ref >90)
Potassium: 4.7 mEq/L (ref 3.5–5.1)
Sodium: 135 mEq/L (ref 135–145)

## 2012-02-29 LAB — SURGICAL PCR SCREEN: MRSA, PCR: NEGATIVE

## 2012-02-29 NOTE — Pre-Procedure Instructions (Signed)
19 Kimberly [name]  02/29/2012   Your procedure is scheduled on:  Tuesday July 9  Report to [facility] at 7:30 AM.  Call this number if you have problems the morning of surgery: [phone]   Remember:   Do not eat food:After Midnight.  May have clear liquids:until Midnight .  Clear liquids include soda, tea, black coffee, apple or grape juice, broth.  Take these medicines the morning of surgery with A SIP OF WATER: Xanax if needed   Do not wear jewelry, make-up or nail polish.  Do not wear lotions, powders, or perfumes. You may wear deodorant.  Do not shave 48 hours prior to surgery. Men may shave face and neck.  Do not bring valuables to the hospital.  Contacts, dentures or bridgework may not be worn into surgery.  Leave suitcase in the car. After surgery it may be brought to your room.  For patients admitted to the hospital, checkout time is 11:00 AM the day of discharge.   Patients discharged the day of surgery will not be allowed to drive home.  Name and phone number of your driver: NA  Special Instructions: CHG Shower Use Special Wash: 1/2 bottle night before surgery and 1/2 bottle morning of surgery.   Please read over the following fact sheets that you were given: Pain Booklet, Coughing and Deep Breathing and Surgical Site Infection Prevention

## 2012-03-01 ENCOUNTER — Ambulatory Visit: Payer: Self-pay | Admitting: Oncology

## 2012-03-01 ENCOUNTER — Telehealth (INDEPENDENT_AMBULATORY_CARE_PROVIDER_SITE_OTHER): Payer: Self-pay | Admitting: General Surgery

## 2012-03-01 NOTE — Telephone Encounter (Signed)
KATHY FROM SHORT STAY CALLED TO REQUEST ORDERS BE ENTERED INTO EPIC FOR BREAST SURGERY ON PT Kimberly [name]/MR [id number].  SURGERY DATE IS 03-07-12/ COORDINATE WITH DR. Odis Luster GY/ [phone]//SHORT STAY

## 2012-03-01 NOTE — Progress Notes (Signed)
Spoke with Kendell Bane at Dr Tenna Child office requesting orders for surgery.  She stated she will put a message in for him.

## 2012-03-06 ENCOUNTER — Telehealth (INDEPENDENT_AMBULATORY_CARE_PROVIDER_SITE_OTHER): Payer: Self-pay

## 2012-03-06 ENCOUNTER — Other Ambulatory Visit (INDEPENDENT_AMBULATORY_CARE_PROVIDER_SITE_OTHER): Payer: Self-pay | Admitting: Surgery

## 2012-03-06 MED ORDER — CHLORHEXIDINE GLUCONATE 4 % EX LIQD: 1.0000 "application " | Freq: Once | CUTANEOUS | Status: DC

## 2012-03-06 MED ORDER — CEFAZOLIN SODIUM-DEXTROSE 2-3 GM-% IV SOLR
2.0000 g | INTRAVENOUS | Status: DC
  Filled 2012-03-06: qty 50

## 2012-03-06 NOTE — Progress Notes (Signed)
Spoke with Sarah at [facility] about needing consent order entered in EPIC. She stated she would let him know.

## 2012-03-06 NOTE — Telephone Encounter (Signed)
Kimberly [name] needs an order put in for obtaining consent for tomorrow's surgery.  I paged Dr Jamey Ripa to let him know.

## 2012-03-07 ENCOUNTER — Encounter (HOSPITAL_COMMUNITY): Payer: Self-pay | Admitting: Certified Registered"

## 2012-03-07 ENCOUNTER — Encounter (HOSPITAL_COMMUNITY)
Admission: RE | Disposition: A | Discharge status: Home / Self Care | Payer: Self-pay | Source: Ambulatory Visit | Attending: Surgery

## 2012-03-07 ENCOUNTER — Encounter (HOSPITAL_COMMUNITY): Payer: Self-pay | Admitting: *Deleted

## 2012-03-07 ENCOUNTER — Ambulatory Visit (HOSPITAL_COMMUNITY)
Admission: RE | Admit: 2012-03-07 | Discharge: 2012-03-09 | Disposition: A | Discharge status: Home / Self Care | Payer: BC Managed Care – PPO | Source: Ambulatory Visit | Attending: Surgery | Admitting: Surgery

## 2012-03-07 ENCOUNTER — Ambulatory Visit (HOSPITAL_COMMUNITY): Payer: BC Managed Care – PPO | Admitting: Certified Registered"

## 2012-03-07 DIAGNOSIS — D0590 Unspecified type of carcinoma in situ of unspecified breast: Secondary | ICD-10-CM

## 2012-03-07 DIAGNOSIS — Z01812 Encounter for preprocedural laboratory examination: Secondary | ICD-10-CM | POA: Insufficient documentation

## 2012-03-07 DIAGNOSIS — C773 Secondary and unspecified malignant neoplasm of axilla and upper limb lymph nodes: Secondary | ICD-10-CM | POA: Insufficient documentation

## 2012-03-07 DIAGNOSIS — C50919 Malignant neoplasm of unspecified site of unspecified female breast: Secondary | ICD-10-CM | POA: Insufficient documentation

## 2012-03-07 DIAGNOSIS — Z4001 Encounter for prophylactic removal of breast: Secondary | ICD-10-CM

## 2012-03-07 DIAGNOSIS — E119 Type 2 diabetes mellitus without complications: Secondary | ICD-10-CM | POA: Insufficient documentation

## 2012-03-07 DIAGNOSIS — I10 Essential (primary) hypertension: Secondary | ICD-10-CM | POA: Insufficient documentation

## 2012-03-07 DIAGNOSIS — Z01810 Encounter for preprocedural cardiovascular examination: Secondary | ICD-10-CM | POA: Insufficient documentation

## 2012-03-07 DIAGNOSIS — Z79899 Other long term (current) drug therapy: Secondary | ICD-10-CM | POA: Insufficient documentation

## 2012-03-07 DIAGNOSIS — N6019 Diffuse cystic mastopathy of unspecified breast: Secondary | ICD-10-CM

## 2012-03-07 DIAGNOSIS — Z794 Long term (current) use of insulin: Secondary | ICD-10-CM | POA: Insufficient documentation

## 2012-03-07 HISTORY — PX: MODIFIED MASTECTOMY: SHX5268

## 2012-03-07 HISTORY — PX: MASTECTOMY: SHX3

## 2012-03-07 HISTORY — PX: BREAST RECONSTRUCTION: SHX9

## 2012-03-07 HISTORY — DX: Pure hypercholesterolemia, unspecified: E78.00

## 2012-03-07 HISTORY — DX: Type 2 diabetes mellitus without complications: E11.9

## 2012-03-07 LAB — GLUCOSE, CAPILLARY: Glucose-Capillary: 179 mg/dL — ABNORMAL HIGH (ref 70–99)

## 2012-03-07 SURGERY — MODIFIED MASTECTOMY
Anesthesia: General | Site: Breast | Laterality: Right | Wound class: Clean

## 2012-03-07 MED ORDER — LACTATED RINGERS IV SOLN
INTRAVENOUS | Status: DC
  Administered 2012-03-07: 09:00:00 via INTRAVENOUS

## 2012-03-07 MED ORDER — LIDOCAINE HCL (CARDIAC) 20 MG/ML IV SOLN
INTRAVENOUS | Status: DC | PRN
  Administered 2012-03-07: 50 mg via INTRAVENOUS

## 2012-03-07 MED ORDER — LIDOCAINE HCL 4 % MT SOLN
OROMUCOSAL | Status: DC | PRN
  Administered 2012-03-07: 4 mL via TOPICAL

## 2012-03-07 MED ORDER — ACETAMINOPHEN 10 MG/ML IV SOLN
INTRAVENOUS | Status: AC
  Filled 2012-03-07: qty 100

## 2012-03-07 MED ORDER — BUPIVACAINE HCL (PF) 0.25 % IJ SOLN
INTRAMUSCULAR | Status: AC
  Filled 2012-03-07: qty 30

## 2012-03-07 MED ORDER — METHOCARBAMOL 100 MG/ML IJ SOLN
500.0000 mg | Freq: Four times a day (QID) | INTRAVENOUS | Status: DC | PRN
  Filled 2012-03-07: qty 5

## 2012-03-07 MED ORDER — CEFAZOLIN SODIUM 1-5 GM-% IV SOLN
1.0000 g | Freq: Three times a day (TID) | INTRAVENOUS | Status: DC
  Administered 2012-03-07 – 2012-03-09 (×6): 1 g via INTRAVENOUS
  Filled 2012-03-07 (×8): qty 50

## 2012-03-07 MED ORDER — INSULIN ASPART 100 UNIT/ML ~~LOC~~ SOLN: 0.0000 [IU] | SUBCUTANEOUS | Status: DC

## 2012-03-07 MED ORDER — CEFAZOLIN SODIUM 1-5 GM-% IV SOLN
INTRAVENOUS | Status: DC | PRN
  Administered 2012-03-07: 2 g via INTRAVENOUS

## 2012-03-07 MED ORDER — ONDANSETRON HCL 4 MG/2ML IJ SOLN
INTRAMUSCULAR | Status: DC | PRN
  Administered 2012-03-07 (×2): 4 mg via INTRAVENOUS

## 2012-03-07 MED ORDER — GLYCOPYRROLATE 0.2 MG/ML IJ SOLN
INTRAMUSCULAR | Status: DC | PRN
  Administered 2012-03-07: .8 mg via INTRAVENOUS

## 2012-03-07 MED ORDER — HYDROMORPHONE HCL PF 1 MG/ML IJ SOLN
0.2500 mg | INTRAMUSCULAR | Status: DC | PRN
  Administered 2012-03-07: 0.25 mg via INTRAVENOUS
  Administered 2012-03-07: 0.5 mg via INTRAVENOUS
  Administered 2012-03-07: 0.25 mg via INTRAVENOUS

## 2012-03-07 MED ORDER — HYDROMORPHONE HCL PF 1 MG/ML IJ SOLN
INTRAMUSCULAR | Status: AC
  Filled 2012-03-07: qty 1

## 2012-03-07 MED ORDER — PROMETHAZINE HCL 25 MG/ML IJ SOLN
INTRAMUSCULAR | Status: AC
  Filled 2012-03-07: qty 1

## 2012-03-07 MED ORDER — SODIUM CHLORIDE 0.45 % IV SOLN
INTRAVENOUS | Status: DC
  Administered 2012-03-07 – 2012-03-08 (×2): via INTRAVENOUS
  Administered 2012-03-08: 100 mL/h via INTRAVENOUS
  Administered 2012-03-09: 04:00:00 via INTRAVENOUS

## 2012-03-07 MED ORDER — METHOCARBAMOL 500 MG PO TABS
500.0000 mg | ORAL_TABLET | Freq: Four times a day (QID) | ORAL | Status: DC
  Administered 2012-03-07 – 2012-03-09 (×6): 500 mg via ORAL
  Filled 2012-03-07 (×11): qty 1

## 2012-03-07 MED ORDER — PROPOFOL 10 MG/ML IV EMUL
INTRAVENOUS | Status: DC | PRN
  Administered 2012-03-07: 130 mg via INTRAVENOUS

## 2012-03-07 MED ORDER — ACETAMINOPHEN 10 MG/ML IV SOLN
INTRAVENOUS | Status: DC | PRN
  Administered 2012-03-07: 1000 mg via INTRAVENOUS

## 2012-03-07 MED ORDER — ROCURONIUM BROMIDE 100 MG/10ML IV SOLN
INTRAVENOUS | Status: DC | PRN
  Administered 2012-03-07 (×2): 50 mg via INTRAVENOUS

## 2012-03-07 MED ORDER — PROMETHAZINE HCL 25 MG/ML IJ SOLN
6.2500 mg | INTRAMUSCULAR | Status: DC | PRN
  Administered 2012-03-07 (×2): 6.25 mg via INTRAVENOUS
  Filled 2012-03-07: qty 1

## 2012-03-07 MED ORDER — 0.9 % SODIUM CHLORIDE (POUR BTL) OPTIME
TOPICAL | Status: DC | PRN
  Administered 2012-03-07 (×3): 1000 mL

## 2012-03-07 MED ORDER — SCOPOLAMINE 1 MG/3DAYS TD PT72
MEDICATED_PATCH | TRANSDERMAL | Status: DC | PRN
  Administered 2012-03-07: 1 via TRANSDERMAL

## 2012-03-07 MED ORDER — INSULIN ASPART 100 UNIT/ML ~~LOC~~ SOLN
0.0000 [IU] | Freq: Three times a day (TID) | SUBCUTANEOUS | Status: DC
  Administered 2012-03-07 – 2012-03-09 (×4): 3 [IU] via SUBCUTANEOUS

## 2012-03-07 MED ORDER — NEOSTIGMINE METHYLSULFATE 1 MG/ML IJ SOLN
INTRAMUSCULAR | Status: DC | PRN
  Administered 2012-03-07: 5 mg via INTRAVENOUS

## 2012-03-07 MED ORDER — INSULIN ASPART 100 UNIT/ML ~~LOC~~ SOLN: 0.0000 [IU] | Freq: Every day | SUBCUTANEOUS | Status: DC

## 2012-03-07 MED ORDER — DOCUSATE SODIUM 100 MG PO CAPS
100.0000 mg | ORAL_CAPSULE | Freq: Every day | ORAL | Status: DC
  Administered 2012-03-07 – 2012-03-09 (×3): 100 mg via ORAL
  Filled 2012-03-07 (×3): qty 1

## 2012-03-07 MED ORDER — LISINOPRIL 5 MG PO TABS
5.0000 mg | ORAL_TABLET | Freq: Every day | ORAL | Status: DC
  Administered 2012-03-07 – 2012-03-09 (×3): 5 mg via ORAL
  Filled 2012-03-07 (×3): qty 1

## 2012-03-07 MED ORDER — BACITRACIN ZINC 500 UNIT/GM EX OINT
TOPICAL_OINTMENT | CUTANEOUS | Status: AC
  Filled 2012-03-07: qty 15

## 2012-03-07 MED ORDER — HYDROMORPHONE HCL 2 MG PO TABS
2.0000 mg | ORAL_TABLET | ORAL | Status: DC | PRN
  Administered 2012-03-07 – 2012-03-09 (×6): 4 mg via ORAL
  Filled 2012-03-07 (×5): qty 2
  Filled 2012-03-07 (×2): qty 1

## 2012-03-07 MED ORDER — SUFENTANIL CITRATE 50 MCG/ML IV SOLN
INTRAVENOUS | Status: DC | PRN
  Administered 2012-03-07 (×3): 10 ug via INTRAVENOUS
  Administered 2012-03-07: 30 ug via INTRAVENOUS

## 2012-03-07 MED ORDER — SODIUM CHLORIDE 0.9 % IR SOLN
Status: DC | PRN
  Administered 2012-03-07: 10:00:00

## 2012-03-07 MED ORDER — PNEUMOCOCCAL VAC POLYVALENT 25 MCG/0.5ML IJ INJ
0.5000 mL | INJECTION | INTRAMUSCULAR | Status: AC
  Filled 2012-03-07: qty 0.5

## 2012-03-07 MED ORDER — PROMETHAZINE HCL 25 MG/ML IJ SOLN: 6.2500 mg | INTRAMUSCULAR | Status: DC | PRN

## 2012-03-07 MED ORDER — LACTATED RINGERS IV SOLN
INTRAVENOUS | Status: DC | PRN
  Administered 2012-03-07 (×3): via INTRAVENOUS

## 2012-03-07 MED ORDER — MIDAZOLAM HCL 5 MG/5ML IJ SOLN
INTRAMUSCULAR | Status: DC | PRN
  Administered 2012-03-07: 2 mg via INTRAVENOUS

## 2012-03-07 SURGICAL SUPPLY — 81 items
ADH SKN CLS APL DERMABOND .7 (GAUZE/BANDAGES/DRESSINGS) ×2
APPLIER CLIP 9.375 MED OPEN (MISCELLANEOUS) ×3
APPLIER CLIP 9.375 SM OPEN (CLIP)
APR CLP MED 9.3 20 MLT OPN (MISCELLANEOUS) ×2
APR CLP SM 9.3 20 MLT OPN (CLIP)
ATCH SMKEVC FLXB CAUT HNDSWH (FILTER) ×2 IMPLANT
BAG DECANTER FOR FLEXI CONT (MISCELLANEOUS) ×3 IMPLANT
BINDER BREAST XLRG (GAUZE/BANDAGES/DRESSINGS) ×1 IMPLANT
BIOPATCH RED 1 DISK 7.0 (GAUZE/BANDAGES/DRESSINGS) ×8 IMPLANT
BLADE SURG 10 STRL SS (BLADE) ×1 IMPLANT
BLADE SURG 15 STRL LF DISP TIS (BLADE) IMPLANT
BLADE SURG 15 STRL SS (BLADE)
CANISTER SUCTION 2500CC (MISCELLANEOUS) ×5 IMPLANT
CHLORAPREP W/TINT 26ML (MISCELLANEOUS) ×6 IMPLANT
CLIP APPLIE 9.375 MED OPEN (MISCELLANEOUS) IMPLANT
CLIP APPLIE 9.375 SM OPEN (CLIP) IMPLANT
CLOTH BEACON ORANGE TIMEOUT ST (SAFETY) ×6 IMPLANT
COVER SURGICAL LIGHT HANDLE (MISCELLANEOUS) ×6 IMPLANT
DERMABOND ADVANCED (GAUZE/BANDAGES/DRESSINGS) ×1
DERMABOND ADVANCED .7 DNX12 (GAUZE/BANDAGES/DRESSINGS) ×2 IMPLANT
DRAIN CHANNEL 19F RND (DRAIN) ×10 IMPLANT
DRAPE LAPAROSCOPIC ABDOMINAL (DRAPES) ×2 IMPLANT
DRAPE ORTHO SPLIT 77X108 STRL (DRAPES) ×6
DRAPE PROXIMA HALF (DRAPES) ×10 IMPLANT
DRAPE SURG 17X11 SM STRL (DRAPES) ×6 IMPLANT
DRAPE SURG 17X23 STRL (DRAPES) ×6 IMPLANT
DRAPE SURG ORHT 6 SPLT 77X108 (DRAPES) ×4 IMPLANT
DRAPE UTILITY 15X26 W/TAPE STR (DRAPE) ×6 IMPLANT
DRAPE WARM FLUID 44X44 (DRAPE) ×3 IMPLANT
DRESSING TELFA 8X3 (GAUZE/BANDAGES/DRESSINGS) IMPLANT
DRSG PAD ABDOMINAL 8X10 ST (GAUZE/BANDAGES/DRESSINGS) ×6 IMPLANT
DRSG TEGADERM 4X4.75 (GAUZE/BANDAGES/DRESSINGS) ×4 IMPLANT
ELECT BLADE 4.0 EZ CLEAN MEGAD (MISCELLANEOUS) ×3
ELECT BLADE 6.5 EXT (BLADE) ×3 IMPLANT
ELECT CAUTERY BLADE 6.4 (BLADE) ×6 IMPLANT
ELECT REM PT RETURN 9FT ADLT (ELECTROSURGICAL) ×3
ELECTRODE BLDE 4.0 EZ CLN MEGD (MISCELLANEOUS) ×2 IMPLANT
ELECTRODE REM PT RTRN 9FT ADLT (ELECTROSURGICAL) ×4 IMPLANT
EVACUATOR SILICONE 100CC (DRAIN) ×12 IMPLANT
EVACUATOR SMOKE ACCUVAC VALLEY (FILTER) ×1
EXPANDER TISSUE 800CC (Breast) ×1 IMPLANT
GAUZE XEROFORM 5X9 LF (GAUZE/BANDAGES/DRESSINGS) ×1 IMPLANT
GLOVE BIO SURGEON STRL SZ7 (GLOVE) ×1 IMPLANT
GLOVE BIO SURGEON STRL SZ7.5 (GLOVE) ×4 IMPLANT
GLOVE BIO SURGEON STRL SZ8 (GLOVE) ×4 IMPLANT
GLOVE BIOGEL PI IND STRL 7.5 (GLOVE) IMPLANT
GLOVE BIOGEL PI IND STRL 8 (GLOVE) ×2 IMPLANT
GLOVE BIOGEL PI INDICATOR 7.5 (GLOVE) ×2
GLOVE BIOGEL PI INDICATOR 8 (GLOVE) ×3
GLOVE EUDERMIC 7 POWDERFREE (GLOVE) ×4 IMPLANT
GLOVE SURG SS PI 7.0 STRL IVOR (GLOVE) ×1 IMPLANT
GOWN PREVENTION PLUS XLARGE (GOWN DISPOSABLE) ×7 IMPLANT
GOWN STRL NON-REIN LRG LVL3 (GOWN DISPOSABLE) ×11 IMPLANT
KIT BASIN OR (CUSTOM PROCEDURE TRAY) ×6 IMPLANT
KIT ROOM TURNOVER OR (KITS) ×5 IMPLANT
MARKER SKIN DUAL TIP RULER LAB (MISCELLANEOUS) ×1 IMPLANT
NS IRRIG 1000ML POUR BTL (IV SOLUTION) ×7 IMPLANT
PACK GENERAL/GYN (CUSTOM PROCEDURE TRAY) ×6 IMPLANT
PAD ARMBOARD 7.5X6 YLW CONV (MISCELLANEOUS) ×6 IMPLANT
PEN SKIN MARKING BROAD (MISCELLANEOUS) ×3 IMPLANT
PREFILTER EVAC NS 1 1/3-3/8IN (MISCELLANEOUS) ×3 IMPLANT
SET ASEPTIC TRANSFER (MISCELLANEOUS) ×1 IMPLANT
SPECIMEN JAR X LARGE (MISCELLANEOUS) ×3 IMPLANT
SPONGE GAUZE 4X4 12PLY (GAUZE/BANDAGES/DRESSINGS) ×4 IMPLANT
SPONGE INTESTINAL PEANUT (DISPOSABLE) ×3 IMPLANT
SPONGE LAP 18X18 X RAY DECT (DISPOSABLE) ×2 IMPLANT
SPONGE LAP 4X18 X RAY DECT (DISPOSABLE) ×3 IMPLANT
STAPLER VISISTAT 35W (STAPLE) ×4 IMPLANT
STRIP CLOSURE SKIN 1/2X4 (GAUZE/BANDAGES/DRESSINGS) ×3 IMPLANT
SUT ETHILON 2 0 FS 18 (SUTURE) ×6 IMPLANT
SUT MNCRL AB 3-0 PS2 18 (SUTURE) ×4 IMPLANT
SUT MNCRL AB 4-0 PS2 18 (SUTURE) ×3 IMPLANT
SUT PDS AB 3-0 SH 27 (SUTURE) IMPLANT
SUT PROLENE 3 0 PS 2 (SUTURE) ×6 IMPLANT
SUT VIC AB 3-0 SH 18 (SUTURE) ×6 IMPLANT
SYR BULB IRRIGATION 50ML (SYRINGE) ×6 IMPLANT
TOWEL OR 17X24 6PK STRL BLUE (TOWEL DISPOSABLE) ×6 IMPLANT
TOWEL OR 17X26 10 PK STRL BLUE (TOWEL DISPOSABLE) ×6 IMPLANT
TRAY FOLEY CATH 14FRSI W/METER (CATHETERS) ×1 IMPLANT
TUBE CONNECTING 12X1/4 (SUCTIONS) ×5 IMPLANT
YANKAUER SUCT BULB TIP NO VENT (SUCTIONS) ×1 IMPLANT

## 2012-03-07 NOTE — H&P (Signed)
   Chief complaint: Surgery for r breast cancer after neoadjuvant chems History of present illness: This patient presented in December with a stage II invasive ductal carcinoma of the right breast receptor positive. A port was placed and she is undergoing chemotherapy which she has now completed. She has a strong family history of breast cancer and because of the large size of her primary, and positive nodes, wishes to have a right mastectomy and axillary node dissection for her treatment for right breast cancer. In addition she very much desires a prophylactic left mastectomy again because of her family history and concern about the development of a new cancer.  Past history family history review of systems are basically unchanged from her original visit. Of note is that she tolerated the chemotherapy well. She did have a urinary tract infection but that completely resolved.  Exam  Vital signs:BP 154/92  Pulse 103  Temp 98.7 F (37.1 C) (Oral)  Resp 18  SpO2 96%   General: Patient alert awake oriented healthy-appearing  GENERAL:  The patient is alert, oriented, and generally healthy-appearing, NAD. Mood and affect are normal.  HEENT:  The head is normocephalic, the eyes nonicteric, the pupils were round regular and equal. EOMs are normal. Pharynx normal. Dentition good.  NECK:  The neck is supple and there are no masses or thyromegaly.  LUNGS:  Normal respirations and clear to auscultation.  HEART:  Regular rhythm, with no murmurs rubs or gallops. Pulses are intact carotid dorsalis pedis and posterior tibial. No significant varicosities are noted.  BREASTS:  No mass and no abnormality noted today  ABDOMEN:  Soft, flat, and nontender. No masses or organomegaly is noted. No hernias are noted. Bowel sounds are normal.  EXTREMITIES:  Good range of motion, no edema.  Data Reviewed: Oncology notes  Imp: Good response to her chemo  Plan:Right modified and left total mastectomy with left  tissue expander

## 2012-03-07 NOTE — Anesthesia Preprocedure Evaluation (Addendum)
Anesthesia Evaluation  Patient identified by MRN, date of birth, ID band Patient awake and Patient confused    Reviewed: Allergy & Precautions, H&P , NPO status , Patient's Chart, lab work & pertinent test results  Airway Mallampati: I TM Distance: >3 FB Neck ROM: Full    Dental  (+) Teeth Intact and Dental Advisory Given   Pulmonary  breath sounds clear to auscultation        Cardiovascular hypertension, Pt. on medications Rhythm:Regular Rate:Normal     Neuro/Psych    GI/Hepatic   Endo/Other  Well Controlled, Type 2, Oral Hypoglycemic Agents and Insulin Dependent  Renal/GU      Musculoskeletal   Abdominal   Peds  Hematology   Anesthesia Other Findings   Reproductive/Obstetrics                         Anesthesia Physical Anesthesia Plan  ASA: III  Anesthesia Plan: General   Post-op Pain Management:    Induction: Intravenous  Airway Management Planned: Oral ETT  Additional Equipment:   Intra-op Plan:   Post-operative Plan: Extubation in OR  Informed Consent: I have reviewed the patients History and Physical, chart, labs and discussed the procedure including the risks, benefits and alternatives for the proposed anesthesia with the patient or authorized representative who has indicated his/her understanding and acceptance.   Dental advisory given  Plan Discussed with: CRNA, Anesthesiologist and Surgeon  Anesthesia Plan Comments:         Anesthesia Quick Evaluation

## 2012-03-07 NOTE — Anesthesia Procedure Notes (Signed)
Procedure Name: Intubation Date/Time: 03/07/2012 10:13 AM Performed by: Glendora Score A Pre-anesthesia Checklist: Patient identified, Emergency Drugs available, Suction available and Patient being monitored Patient Re-evaluated:Patient Re-evaluated prior to inductionOxygen Delivery Method: Circle system utilized Preoxygenation: Pre-oxygenation with 100% oxygen Intubation Type: IV induction Ventilation: Mask ventilation without difficulty and Oral airway inserted - appropriate to patient size Laryngoscope Size: Hyacinth Meeker and 2 Grade View: Grade I Tube type: Oral Tube size: 7.5 mm Number of attempts: 1 Airway Equipment and Method: Stylet and LTA kit utilized Placement Confirmation: ETT inserted through vocal cords under direct vision,  positive ETCO2 and breath sounds checked- equal and bilateral Secured at: 22 cm Tube secured with: Tape Dental Injury: Teeth and Oropharynx as per pre-operative assessment

## 2012-03-07 NOTE — Op Note (Signed)
Kimberly [name] November 04, 1955 [id number] 01/28/2012  Preoperative diagnosis: Right breast cancer, status post neoadjuvant chemotherapy; desires prophylactic left mastectomy  Postoperative diagnosis: Same  Procedure: Right modified radical mastectomy, left total mastectomy  Surgeon: Currie Paris, MD, FACS  Assistant: Dr. Delane Ginger  Anesthesia: General   Clinical History and Indications: This patient presented several months ago with a fairly large right breast cancer with axillary metastases. She's undergone neoadjuvant chemotherapy. She is likely to have a right total mastectomy with axillary dissection and with plans for postoperative radiation therapy. She's decided also had a prophylactic left mastectomy. She is scheduled for a tissue expander reconstruction on the left but no extraction on the right.    Description of Procedure: The patient is seen in the preoperative area and the plans for surgery reviewed. I marked the right side is the side For the axillary dissection. The patient was taken to the operating room after satisfactory general endotracheal anesthesia had been obtained both breasts were prepped and draped as a sterile field. A timeout was done.  On the right side I outlined a long fairly wide elliptical incision. I raised skin flaps in the usual fashion with the clavicle superiorly, sternum medially, the rectus sheath inferiorly, and latissimus dorsi laterally. The breast was then removed from the underlying muscle using cautery and taking the fascia. I got to the edge of the pectoralis fashion and then opened down into the axillary space. I then completed an extra dissection by sitting x-ray contents from medial to lateral and superior to inferior. I divided the second intercostal brachial nerve. The long thoracic and thoracodorsal nerves were identified and preserved. I went up to the level of the axillary vein but not above that for the dissection. The final  lateral attachments of the breast is latissimus and axilla were divided.  Careful check was made for hemostasis and the wound was irrigated. 2 19 Blake drains were placed and secured with 2-0 nylon.The wound was irrigated again and appeared dry so as closed with staples.  A left total mastectomy was then done. A transverse incision was made taking only the skin of the nipple areolar complex. Skin flaps were raises on the right side. However the axilla was not entered. Again the breast was removed from medial to lateral but this time I left the fascia, muscle. I stayed away from the area of the Port-A-Cath. There was one node that came out with the breast as it was really in the axillary peel Spence but no formal entry of the axilla was done.  Irrigated made sure was dry the left moist laparotomy pack. Dr. Odis Luster came in to do the reconstruction on the left.  Estimated blood loss for this portion of the procedure was 200 cc. There were no complications. Currie Paris, MD, FACS 03/07/2012 12:53 PM

## 2012-03-07 NOTE — Op Note (Signed)
NAME:  Kimberly [name], Kimberly [name] NO.:  [id number]  MEDICAL RECORD NO.:  [id number]  LOCATION:  [facility]                         FACILITY:  [facility]  PHYSICIAN:  Etter Sjogren, M.D.     DATE OF BIRTH:  1956/07/13  DATE OF PROCEDURE:  03/07/2012 DATE OF DISCHARGE:                              OPERATIVE REPORT   PREOPERATIVE DIAGNOSIS:  Breast cancer.  POSTOPERATIVE DIAGNOSIS:  Breast cancer.  PROCEDURE PERFORMED:  Left breast reconstruction with tissue expander.  SURGEON:  Etter Sjogren, M.D.  ANESTHESIA:  General.  ESTIMATED BLOOD LOSS:  15 mL.  DRAINS:  Two 19-French.  CLINICAL NOTE:  This 56 year old woman has breast cancer, is having bilateral mastectomy today, and we reconstructed on the left side only. It is suspected that radiation therapy will be given on the right side where she has some positive lymph nodes and it was therefore recommended that the reconstruction will be withheld for the time being.  This was discussed with her radiation oncologist, Dr. Carmina Miller in [place], and he did recommend holding off on any type of reconstruction on the right.  The nature of this procedure and risks plus complications were discussed with her in detail.  Risks include, but not limited to, bleeding, infection, anesthesia complications, healing problems, scarring, loss of sensation, fluid accumulations, pneumothorax, pulmonary embolism, failure of device, capsular contracture, displacement of device, wrinkles, ripples, loss of skin, loss of tissue, asymmetry, disappointment, and she understood all of this and wished to proceed.  DESCRIPTION OF PROCEDURE:  The patient was taken to the operating room and General Surgery completed the right mastectomy and closed the wound and then the left mastectomy.  The skin flaps were inspected and they were found to be in excellent condition, excellent color, bright-red bleeding on the periphery consistent with viability.   Thorough irrigation of the wound with saline.  The dissection was then carried deep to the pectoralis muscle and raised submuscular flaps in all direction using pectoralis as well as serratus anterolaterally.  Great care was taken to avoid damage to underlying chest cavity.  Meticulous hemostasis with electrocautery.  The space was developed to the dimensions of the expander and was planned using 800 mL Mentor tissue expander.  This was measured to confirm, through irrigation with saline, irrigation with antibiotic solution, and meticulous hemostasis with electrocautery.  The expander was soaked in antibiotic solution for greater than 5 minutes.  This was a Mentor 800 mL moderate height expander and 220 mL of sterile saline placed using a closed-filling system.  The expander was then positioned after again checking for hemostasis, which was noted be excellent.  The muscle closure with 3-0 Vicryl interrupted figure-of-eight sutures.  The mastectomy space again irrigated with saline and again meticulous hemostasis with electrocautery.  Two 19-French drains were positioned and brought through separate stab wounds inferolaterally and secured with 3-0 Prolene sutures.  One of these was placed beneath the inferior mastectomy flap and the other beneath the superior mastectomy flap.  The skin closure with 3-0 Monocryl interrupted inverted deep dermal sutures and Dermabond.  The Biopatch and Tegaderm used to dress the drain.  Dry sterile dressings and the Chest/Vest placed and she was transferred to  recovery room in stable and having tolerated procedure well.     Etter Sjogren, M.D.     DB/MEDQ  D:  03/07/2012  T:  03/07/2012  Job:  [id number]  cc:   Etter Sjogren, M.D.

## 2012-03-07 NOTE — Transfer of Care (Signed)
Immediate Anesthesia Transfer of Care Note  Patient: Kimberly [name]  Procedure(s) Performed: Procedure(s) (LRB): MODIFIED MASTECTOMY (Right) BREAST RECONSTRUCTION (Left) TOTAL MASTECTOMY (Left)  Patient Location: PACU  Anesthesia Type: General  Level of Consciousness: awake, alert , oriented and patient cooperative  Airway & Oxygen Therapy: Patient Spontanous Breathing and Patient connected to face mask oxygen  Post-op Assessment: Report given to PACU RN  Post vital signs: Reviewed and stable  Complications: No apparent anesthesia complications

## 2012-03-07 NOTE — Preoperative (Signed)
Beta Blockers   Reason not to administer Beta Blockers:Not Applicable 

## 2012-03-07 NOTE — Anesthesia Postprocedure Evaluation (Signed)
  Anesthesia Post-op Note  Patient: Kimberly [name]  Procedure(s) Performed: Procedure(s) (LRB): MODIFIED MASTECTOMY (Right) BREAST RECONSTRUCTION (Left) TOTAL MASTECTOMY (Left)  Patient Location: PACU  Anesthesia Type: General  Level of Consciousness: awake, oriented, sedated and patient cooperative  Airway and Oxygen Therapy: Patient Spontanous Breathing and Patient connected to nasal cannula oxygen  Post-op Pain: moderate  Post-op Assessment: Post-op Vital signs reviewed, Patient's Cardiovascular Status Stable, Respiratory Function Stable, Patent Airway, No signs of Nausea or vomiting and Pain level controlled  Post-op Vital Signs: stable  Complications: No apparent anesthesia complications

## 2012-03-07 NOTE — Brief Op Note (Signed)
03/07/2012  2:19 PM  PATIENT:  Kimberly [name]  56 y.o. female  PRE-OPERATIVE DIAGNOSIS:  Right breast cancer  POST-OPERATIVE DIAGNOSIS:  Right breast cancer  PROCEDURE:  Procedure(s) (LRB): MODIFIED MASTECTOMY (Right) BREAST RECONSTRUCTION (Left) with tissue expander TOTAL MASTECTOMY (Left)  SURGEON:  Surgeon(s) and Role: Panel 1:    * Christian Leta Jungling, MD - Primary    * Ernestene Mention, MD  Panel 2:    * Etter Sjogren, MD - Primary  PHYSICIAN ASSISTANT:   ASSISTANTS: none   ANESTHESIA:   General  EBL:  Total I/O In: 2000 [I.V.:2000] Out: 700 [Urine:500; Blood:200]  BLOOD ADMINISTERED:none  DRAINS: (2) Jackson-Pratt drain(s) with closed bulb suction in the left mastectomy site   LOCAL MEDICATIONS USED:  NONE  SPECIMEN:  No Specimen  DISPOSITION OF SPECIMEN:  N/A  COUNTS:  YES  TOURNIQUET:  * No tourniquets in log *  DICTATION: .Other Dictation: Dictation Number [id number]  PLAN OF CARE: Admit for overnight observation  PATIENT DISPOSITION:  PACU - hemodynamically stable.   Delay start of Pharmacological VTE agent (>24hrs) due to surgical blood loss or risk of bleeding: yes

## 2012-03-08 ENCOUNTER — Encounter (HOSPITAL_COMMUNITY): Payer: Self-pay | Admitting: Surgery

## 2012-03-08 LAB — GLUCOSE, CAPILLARY
Glucose-Capillary: 169 mg/dL — ABNORMAL HIGH (ref 70–99)
Glucose-Capillary: 176 mg/dL — ABNORMAL HIGH (ref 70–99)
Glucose-Capillary: 192 mg/dL — ABNORMAL HIGH (ref 70–99)

## 2012-03-08 NOTE — Progress Notes (Signed)
1 Day Post-Op  Subjective: Feels OK, pain controlled, nausea better, not ambulated yet  Objective: Vital signs in last 24 hours: Temp:  [97.5 F (36.4 C)-99.1 F (37.3 C)] 99.1 F (37.3 C) (07/10 0536) Pulse Rate:  [78-105] 100  (07/10 0536) Resp:  [11-18] 18  (07/10 0536) BP: (93-186)/(62-94) 93/62 mmHg (07/10 0536) SpO2:  [95 %-100 %] 95 % (07/10 0536) FiO2 (%):  [3 %-6 %] 3 % (07/09 1515)   Intake/Output from previous day: 07/09 0701 - 07/10 0700 In: 3705 [I.V.:3605; IV Piggyback:100] Out: 800 [Urine:500; Drains:100; Blood:200] Intake/Output this shift:     General appearance: alert, cooperative and no distress  Incision: Incision OK, JP on right are thin, red  Lab Results:  No results found for this basename: WBC:2,HGB:2,HCT:2,PLT:2 in the last 72 hours BMET No results found for this basename: NA:2,K:2,CL:2,CO2:2,GLUCOSE:2,BUN:2,CREATININE:2,CALCIUM:2 in the last 72 hours PT/INR No results found for this basename: LABPROT:2,INR:2 in the last 72 hours ABG No results found for this basename: PHART:2,PCO2:2,PO2:2,HCO3:2 in the last 72 hours  MEDS, Scheduled    .  ceFAZolin (ANCEF) IV  1 g Intravenous Q8H  . docusate sodium  100 mg Oral Daily  . HYDROmorphone      . insulin aspart  0-15 Units Subcutaneous TID WC  . insulin aspart  0-5 Units Subcutaneous QHS  . lisinopril  5 mg Oral Daily  . methocarbamol  500 mg Oral QID  . pneumococcal 23 valent vaccine  0.5 mL Intramuscular Tomorrow-1000  . promethazine      . DISCONTD:  ceFAZolin (ANCEF) IV  2 g Intravenous 60 min Pre-Op  . DISCONTD: chlorhexidine  1 application Topical Once  . DISCONTD: chlorhexidine  1 application Topical Once  . DISCONTD: insulin aspart  0-24 Units Subcutaneous Q2H  . DISCONTD: insulin aspart  0-24 Units Subcutaneous Q4H    Studies/Results: No results found.  Assessment: s/p Procedure(s): MODIFIED MASTECTOMY BREAST RECONSTRUCTION TOTAL MASTECTOMY Stable, not ready to  discharge  Plan: Abulate more today, likely dc in am   LOS: 1 day     Currie Paris, MD, [facility], [place] [phone]   03/08/2012 7:51 AM

## 2012-03-08 NOTE — Progress Notes (Signed)
Subjective: Sore especially on left side as expected. Good pain relief with PO Dilaudid.   Objective: Vital signs in last 24 hours: Temp:  [97.5 F (36.4 C)-99.1 F (37.3 C)] 99.1 F (37.3 C) (07/10 0536) Pulse Rate:  [78-105] 100  (07/10 0536) Resp:  [11-18] 18  (07/10 0536) BP: (93-186)/(62-94) 93/62 mmHg (07/10 0536) SpO2:  [95 %-100 %] 95 % (07/10 0536) FiO2 (%):  [3 %-6 %] 3 % (07/09 1515)  Intake/Output from previous day: 07/09 0701 - 07/10 0700 In: 3705 [I.V.:3605; IV Piggyback:100] Out: 800 [Urine:500; Drains:100; Blood:200] Intake/Output this shift:    Operative sites: Mastectomy flaps viable. Good color throughout. Tissue expander in good position. Drains functioning. Drainage thin. No evidence of hematoma or bleeding either side.  No results found for this basename: WBC:2,HGB:2,HCT:2,PLATELETS:2,NA:2,K:2,CL:2,CO2:2,BUN:2,CREATININE:2,GLU:2 in the last 72 hours  Studies/Results: No results found.  Assessment/Plan: Advance diet. Encourage spirometry. Ambulate.  LOS: 1 day    Kimberly [name] M 03/08/2012 7:45 AM

## 2012-03-09 LAB — GLUCOSE, CAPILLARY: Glucose-Capillary: 171 mg/dL — ABNORMAL HIGH (ref 70–99)

## 2012-03-09 MED ORDER — HYDROMORPHONE HCL 2 MG PO TABS: 2.0000 mg | ORAL_TABLET | ORAL | Status: AC | PRN

## 2012-03-09 MED ORDER — CEPHALEXIN 500 MG PO CAPS: 500.0000 mg | ORAL_CAPSULE | Freq: Two times a day (BID) | ORAL | Status: DC

## 2012-03-09 MED ORDER — DSS 100 MG PO CAPS: 100.0000 mg | ORAL_CAPSULE | Freq: Every day | ORAL | Status: AC

## 2012-03-09 MED ORDER — METHOCARBAMOL 500 MG PO TABS: 500.0000 mg | ORAL_TABLET | Freq: Four times a day (QID) | ORAL | Status: AC

## 2012-03-09 MED ORDER — CEPHALEXIN 500 MG PO CAPS
500.0000 mg | ORAL_CAPSULE | Freq: Two times a day (BID) | ORAL | Status: DC
  Filled 2012-03-09 (×2): qty 1

## 2012-03-09 NOTE — Progress Notes (Signed)
2 Days Post-Op  Subjective: Ready to go home  Objective: Vital signs in last 24 hours: Temp:  [98.1 F (36.7 C)-100.5 F (38.1 C)] 98.6 F (37 C) (07/11 0542) Pulse Rate:  [101-117] 101  (07/11 0542) Resp:  [18] 18  (07/11 0542) BP: (108-139)/(54-69) 108/54 mmHg (07/11 0542) SpO2:  [91 %-93 %] 92 % (07/11 0542)   Intake/Output from previous day: 07/10 0701 - 07/11 0700 In: 4365.3 [P.O.:480; I.V.:3735.3; IV Piggyback:150] Out: 362 [Drains:362] Intake/Output this shift: Total I/O In: -  Out: 13 [Drains:13]   General appearance: alert, cooperative and no distress  Incision: JPs thin  Lab Results:  No results found for this basename: WBC:2,HGB:2,HCT:2,PLT:2 in the last 72 hours BMET No results found for this basename: NA:2,K:2,CL:2,CO2:2,GLUCOSE:2,BUN:2,CREATININE:2,CALCIUM:2 in the last 72 hours PT/INR No results found for this basename: LABPROT:2,INR:2 in the last 72 hours ABG No results found for this basename: PHART:2,PCO2:2,PO2:2,HCO3:2 in the last 72 hours  MEDS, Scheduled    . cephALEXin  500 mg Oral Q12H  . docusate sodium  100 mg Oral Daily  . insulin aspart  0-15 Units Subcutaneous TID WC  . insulin aspart  0-5 Units Subcutaneous QHS  . lisinopril  5 mg Oral Daily  . methocarbamol  500 mg Oral QID  . pneumococcal 23 valent vaccine  0.5 mL Intramuscular Tomorrow-1000  . DISCONTD:  ceFAZolin (ANCEF) IV  1 g Intravenous Q8H    Studies/Results: No results found.  Assessment: s/p Procedure(s): MODIFIED MASTECTOMY BREAST RECONSTRUCTION TOTAL MASTECTOMY Home today  Plan: Discharge   LOS: 2 days     Currie Paris, MD, [facility], [place] [phone]   03/09/2012 9:04 AM

## 2012-03-09 NOTE — Progress Notes (Signed)
Discharge patient.

## 2012-03-09 NOTE — Discharge Summary (Signed)
Physician Discharge Summary  Patient ID: DELANEY PERONA MRN: [id number] DOB/AGE: 03-21-1956 56 y.o.  Admit date: 03/07/2012 Discharge date: 03/09/2012  Admission Diagnoses:Breast Cancer  Discharge Diagnoses: Same Active Problems:  * No active hospital problems. *    Discharged Condition: good  Hospital Course: On the day of admission the patient was taken to surgery and had bilateral mastectomy and left reconstruction with tissue expander. The patient tolerated the procedures well. Postoperatively, the flap maintained excellent color and capillary refill. The patient was ambulatory and tolerating diet on the first postoperative day. Low grade fever responded to ambulation and incentive spirometry. Tolerating diet. CBGs under control.  Treatments: antibiotics: Ancef, anticoagulation: none and surgery: bilateral mastectomy, right sentinel lymph node, left breast reconstruction with tissue expander.  Discharge Exam: Blood pressure 108/54, pulse 101, temperature 98.6 F (37 C), temperature source Oral, resp. rate 18, SpO2 92.00%.  Operative sites: Mastectomy flaps viable. Good color throughout. Tissue expander left side in good position. Drains functioning. Drainage thin.  Disposition: Final discharge disposition not confirmed   Medication List  As of 03/09/2012  8:08 AM   ASK your doctor about these medications         ALPRAZolam 0.25 MG tablet   Commonly known as: XANAX   Take 0.25 mg by mouth 3 (three) times daily as needed. For anxiety.      insulin glargine 100 UNIT/ML injection   Commonly known as: LANTUS   Inject 28 Units into the skin at bedtime.      lisinopril 5 MG tablet   Commonly known as: PRINIVIL,ZESTRIL   Take 5 mg by mouth daily.      metFORMIN 500 MG tablet   Commonly known as: GLUCOPHAGE   Take 1,000 mg by mouth 2 (two) times daily with a meal.      saxagliptin HCl 2.5 MG Tabs tablet   Commonly known as: ONGLYZA   Take 5 mg by mouth daily.             SignedOdis Luster, Jackqulyn Mendel M 03/09/2012, 8:08 AM

## 2012-03-10 ENCOUNTER — Encounter (INDEPENDENT_AMBULATORY_CARE_PROVIDER_SITE_OTHER): Payer: Self-pay | Admitting: Surgery

## 2012-03-10 ENCOUNTER — Telehealth (INDEPENDENT_AMBULATORY_CARE_PROVIDER_SITE_OTHER): Payer: Self-pay | Admitting: Surgery

## 2012-03-10 NOTE — Telephone Encounter (Signed)
I called her to review the pathology report today (03/10/12).

## 2012-03-15 ENCOUNTER — Encounter (INDEPENDENT_AMBULATORY_CARE_PROVIDER_SITE_OTHER): Payer: Self-pay | Admitting: Surgery

## 2012-03-15 ENCOUNTER — Ambulatory Visit (INDEPENDENT_AMBULATORY_CARE_PROVIDER_SITE_OTHER): Payer: BC Managed Care – PPO | Admitting: Surgery

## 2012-03-15 VITALS — BP 148/96 | HR 72 | Temp 99.2°F | Resp 16 | Ht 62.0 in | Wt 195.0 lb

## 2012-03-15 DIAGNOSIS — Z09 Encounter for follow-up examination after completed treatment for conditions other than malignant neoplasm: Secondary | ICD-10-CM

## 2012-03-15 NOTE — Patient Instructions (Signed)
I can see you back next week to get her staples out if your plastic surgeon doesn't do it when he sees you next week.

## 2012-03-15 NOTE — Progress Notes (Signed)
Kimberly [name]    [id number] 03/15/2012    1955/12/28   CC: Post op Mastectomies  HPI: The patient returns for post op follow-up. She underwent a Right modified radical and left total mastectomy on 03/08/12. Over all she feels that she is doing well. She mainly feels weak. She notes that 2 of her drains have been removed by the plastic surgeon but 2 remain.  PE: VITAL SIGNS: BP 148/96  Pulse 72  Temp 99.2 F (37.3 C) (Temporal)  Resp 16  Ht 5\' 2"  (1.575 m)  Wt 195 lb (88.451 kg)  BMI 35.67 kg/m2  The incision is healing nicely and there is no evidence of infection or hematoma.  The drains are Still a little too much to remove. She is put at 25 cc from #4 in the last 12 hours and 30 cc from #2 in the last 12 hours.Marland Kitchen  DATA REVIEWED: Pathology report showed 1.8 cm residual cancer with 5 positive lymph nodes status post neoadjuvant chemotherapy. I gave the patient a copy of this and reviewed with her.  IMPRESSION: Patient doing well. Drains not yet ready to remove  PLAN: Her next visit will be in 2 weeks, sooner if when he gets more staples out. We did remove alternate staples today.Marland Kitchen

## 2012-03-16 ENCOUNTER — Encounter (INDEPENDENT_AMBULATORY_CARE_PROVIDER_SITE_OTHER): Payer: Self-pay

## 2012-03-21 ENCOUNTER — Encounter (INDEPENDENT_AMBULATORY_CARE_PROVIDER_SITE_OTHER): Payer: Self-pay | Admitting: Surgery

## 2012-03-21 ENCOUNTER — Ambulatory Visit (INDEPENDENT_AMBULATORY_CARE_PROVIDER_SITE_OTHER): Payer: BC Managed Care – PPO | Admitting: Surgery

## 2012-03-21 VITALS — BP 146/92 | HR 76 | Temp 98.1°F | Resp 16 | Ht 62.0 in | Wt 175.2 lb

## 2012-03-21 DIAGNOSIS — Z09 Encounter for follow-up examination after completed treatment for conditions other than malignant neoplasm: Secondary | ICD-10-CM

## 2012-03-21 MED ORDER — SODIUM CHLORIDE 0.9 % IR SOLN: 3.0000 mL | Freq: Once | Status: DC

## 2012-03-21 MED ORDER — SODIUM CHLORIDE 0.9 % IR SOLN: 3.0000 mL | Freq: Once | Status: AC

## 2012-03-21 NOTE — Progress Notes (Signed)
Chief complaint: Wound is opened up  History of present illness: This patient had her right mastectomy drain removed yesterday as apparently was not functioning properly. She also had her staples removed. Last night she developed drainage from the lateral part of her wound in the lateral part of the wound dehisced. She came to the office today for evaluation.  Exam: Vital signs:BP 146/92  Pulse 76  Temp 98.1 F (36.7 C) (Temporal)  Resp 16  Ht 5\' 2"  (1.575 m)  Wt 175 lb 4 oz (79.493 kg)  BMI 32.05 kg/m2  General: Patient alert and healthy appearing, no acute distress  Breasts: The left side is healing nicely with the implant. The lateral approximately 7 cm of the right mastectomy incision has opened up and there is a cavity present from her seroma and lymph node dissection. It is fairly clean and there is no evidence of infection.  Impression: Superficial wound dehisced and slightly from reaccumulation of fluid and then distraction of the incision.  Plan: This will need to heal by secondary intention. We will start her repacking this twice a day with sterile gauze and see her again on Friday. I believe this will close but will probably take her weeks to completely close and depending on how much lymphatic drainage she has.

## 2012-03-21 NOTE — Patient Instructions (Signed)
Repack the incision with sterile gauze twice a day. It is okay to shower but do not get soap in the incision.

## 2012-03-24 ENCOUNTER — Encounter (INDEPENDENT_AMBULATORY_CARE_PROVIDER_SITE_OTHER): Payer: Self-pay | Admitting: Surgery

## 2012-03-24 ENCOUNTER — Ambulatory Visit (INDEPENDENT_AMBULATORY_CARE_PROVIDER_SITE_OTHER): Payer: BC Managed Care – PPO | Admitting: Surgery

## 2012-03-24 VITALS — BP 130/92 | HR 68 | Temp 97.4°F | Resp 20 | Ht 62.0 in | Wt 179.0 lb

## 2012-03-24 DIAGNOSIS — E119 Type 2 diabetes mellitus without complications: Secondary | ICD-10-CM | POA: Insufficient documentation

## 2012-03-24 DIAGNOSIS — Z09 Encounter for follow-up examination after completed treatment for conditions other than malignant neoplasm: Secondary | ICD-10-CM

## 2012-03-24 NOTE — Progress Notes (Signed)
Chief complaint: Postop visit with dehiscence of right mastectomy incision  History of present illness: This patient is postop right modified radical mastectomy. The day after her drain and staples were removed the lateral portion of the wound opened up leaving a cavity. This was 3 days ago. It's been packed with sterile gauze intermittently by the patient's husband and she comes back for followup today.  Exam: Vital signs:BP 130/92  Pulse 68  Temp 97.4 F (36.3 C) (Temporal)  Resp 20  Ht 5\' 2"  (1.575 m)  Wt 179 lb (81.194 kg)  BMI 32.74 kg/m2 Breasts: The medial two thirds of the incision is healing okay. There is an opening laterally with some extra skin in the axilla. It is clean. The inferior flap appears stuck down. There does not appear to be a lot of lymphatic drainage.  Impression: Postop wound dehiscence of mastectomy incision  Plan: I discussed the situation with her plastic surgeon. He'll evaluate her on Monday. Tentatively, we are planning to take her back to the operating room to do a flap closure later in the week. I discussed that with the patient had her husband.

## 2012-03-24 NOTE — Patient Instructions (Signed)
We will schedule surgery, coordinated with her plastic surgeon, to close the open part of your right mastectomy incision

## 2012-03-28 ENCOUNTER — Encounter (HOSPITAL_BASED_OUTPATIENT_CLINIC_OR_DEPARTMENT_OTHER): Payer: Self-pay | Admitting: *Deleted

## 2012-03-28 ENCOUNTER — Encounter (INDEPENDENT_AMBULATORY_CARE_PROVIDER_SITE_OTHER): Payer: BC Managed Care – PPO | Admitting: Surgery

## 2012-03-28 NOTE — Progress Notes (Signed)
Will need istat am surg Had bilat mast 7/13

## 2012-03-30 ENCOUNTER — Ambulatory Visit (HOSPITAL_BASED_OUTPATIENT_CLINIC_OR_DEPARTMENT_OTHER)
Admission: RE | Admit: 2012-03-30 | Discharge: 2012-03-30 | Disposition: A | Discharge status: Home / Self Care | Payer: BC Managed Care – PPO | Source: Ambulatory Visit | Attending: Surgery | Admitting: Surgery

## 2012-03-30 ENCOUNTER — Ambulatory Visit: Payer: Self-pay | Admitting: Oncology

## 2012-03-30 ENCOUNTER — Ambulatory Visit (HOSPITAL_BASED_OUTPATIENT_CLINIC_OR_DEPARTMENT_OTHER): Payer: BC Managed Care – PPO | Admitting: Anesthesiology

## 2012-03-30 ENCOUNTER — Encounter (HOSPITAL_BASED_OUTPATIENT_CLINIC_OR_DEPARTMENT_OTHER)
Admission: RE | Disposition: A | Discharge status: Home / Self Care | Payer: Self-pay | Source: Ambulatory Visit | Attending: Surgery

## 2012-03-30 ENCOUNTER — Encounter (HOSPITAL_BASED_OUTPATIENT_CLINIC_OR_DEPARTMENT_OTHER): Payer: Self-pay | Admitting: Anesthesiology

## 2012-03-30 ENCOUNTER — Encounter (HOSPITAL_BASED_OUTPATIENT_CLINIC_OR_DEPARTMENT_OTHER): Payer: Self-pay | Admitting: *Deleted

## 2012-03-30 DIAGNOSIS — Z9010 Acquired absence of unspecified breast and nipple: Secondary | ICD-10-CM | POA: Insufficient documentation

## 2012-03-30 DIAGNOSIS — I10 Essential (primary) hypertension: Secondary | ICD-10-CM | POA: Insufficient documentation

## 2012-03-30 DIAGNOSIS — E119 Type 2 diabetes mellitus without complications: Secondary | ICD-10-CM | POA: Insufficient documentation

## 2012-03-30 DIAGNOSIS — T81329A Deep disruption or dehiscence of operation wound, unspecified, initial encounter: Secondary | ICD-10-CM | POA: Insufficient documentation

## 2012-03-30 DIAGNOSIS — Z853 Personal history of malignant neoplasm of breast: Secondary | ICD-10-CM | POA: Insufficient documentation

## 2012-03-30 DIAGNOSIS — T8132XA Disruption of internal operation (surgical) wound, not elsewhere classified, initial encounter: Secondary | ICD-10-CM | POA: Insufficient documentation

## 2012-03-30 DIAGNOSIS — Y836 Removal of other organ (partial) (total) as the cause of abnormal reaction of the patient, or of later complication, without mention of misadventure at the time of the procedure: Secondary | ICD-10-CM | POA: Insufficient documentation

## 2012-03-30 HISTORY — PX: SCAR REVISION: SHX5285

## 2012-03-30 HISTORY — DX: Other specified postprocedural states: Z98.890

## 2012-03-30 HISTORY — DX: Nausea with vomiting, unspecified: R11.2

## 2012-03-30 LAB — POCT I-STAT, CHEM 8
HCT: 33 % — ABNORMAL LOW (ref 36.0–46.0)
Hemoglobin: 11.2 g/dL — ABNORMAL LOW (ref 12.0–15.0)
Sodium: 137 mEq/L (ref 135–145)
TCO2: 21 mmol/L (ref 0–100)

## 2012-03-30 SURGERY — REVISION, SCAR
Anesthesia: General | Site: Breast | Laterality: Right | Wound class: Clean Contaminated

## 2012-03-30 MED ORDER — CEPHALEXIN 500 MG PO CAPS: 500.0000 mg | ORAL_CAPSULE | Freq: Four times a day (QID) | ORAL | Status: AC

## 2012-03-30 MED ORDER — SODIUM CHLORIDE 0.9 % IR SOLN
Status: DC | PRN
  Administered 2012-03-30: 15:00:00

## 2012-03-30 MED ORDER — HYDROMORPHONE HCL PF 1 MG/ML IJ SOLN: 0.2500 mg | INTRAMUSCULAR | Status: DC | PRN

## 2012-03-30 MED ORDER — ONDANSETRON HCL 4 MG/2ML IJ SOLN
4.0000 mg | Freq: Four times a day (QID) | INTRAMUSCULAR | Status: AC | PRN
  Administered 2012-03-30: 4 mg via INTRAVENOUS

## 2012-03-30 MED ORDER — ONDANSETRON HCL 4 MG/2ML IJ SOLN
INTRAMUSCULAR | Status: DC | PRN
  Administered 2012-03-30: 4 mg via INTRAVENOUS

## 2012-03-30 MED ORDER — HYDROCODONE-ACETAMINOPHEN 10-325 MG PO TABS: 1.0000 | ORAL_TABLET | ORAL | Status: DC | PRN

## 2012-03-30 MED ORDER — LACTATED RINGERS IV SOLN
INTRAVENOUS | Status: DC
  Administered 2012-03-30 (×2): via INTRAVENOUS

## 2012-03-30 MED ORDER — PROPOFOL 10 MG/ML IV EMUL
INTRAVENOUS | Status: DC | PRN
  Administered 2012-03-30: 150 mg via INTRAVENOUS

## 2012-03-30 MED ORDER — CEFAZOLIN SODIUM 1-5 GM-% IV SOLN
INTRAVENOUS | Status: DC | PRN
  Administered 2012-03-30: 2 g via INTRAVENOUS

## 2012-03-30 MED ORDER — MIDAZOLAM HCL 5 MG/5ML IJ SOLN
INTRAMUSCULAR | Status: DC | PRN
  Administered 2012-03-30: 1 mg via INTRAVENOUS

## 2012-03-30 MED ORDER — DEXAMETHASONE SODIUM PHOSPHATE 4 MG/ML IJ SOLN
INTRAMUSCULAR | Status: DC | PRN
  Administered 2012-03-30: 4 mg via INTRAVENOUS

## 2012-03-30 MED ORDER — SODIUM CHLORIDE 0.9 % IR SOLN
Status: DC | PRN
  Administered 2012-03-30: 3000 mL

## 2012-03-30 MED ORDER — EPHEDRINE SULFATE 50 MG/ML IJ SOLN
INTRAMUSCULAR | Status: DC | PRN
  Administered 2012-03-30 (×2): 10 mg via INTRAVENOUS

## 2012-03-30 MED ORDER — LIDOCAINE HCL (CARDIAC) 20 MG/ML IV SOLN
INTRAVENOUS | Status: DC | PRN
  Administered 2012-03-30: 50 mg via INTRAVENOUS

## 2012-03-30 MED ORDER — HYDROCODONE-ACETAMINOPHEN 10-325 MG PO TABS: 1.0000 | ORAL_TABLET | ORAL | Status: AC | PRN

## 2012-03-30 MED ORDER — FENTANYL CITRATE 0.05 MG/ML IJ SOLN
INTRAMUSCULAR | Status: DC | PRN
  Administered 2012-03-30 (×3): 25 ug via INTRAVENOUS
  Administered 2012-03-30: 50 ug via INTRAVENOUS
  Administered 2012-03-30 (×3): 25 ug via INTRAVENOUS
  Administered 2012-03-30: 50 ug via INTRAVENOUS

## 2012-03-30 MED ORDER — CEPHALEXIN 500 MG PO CAPS: 500.0000 mg | ORAL_CAPSULE | Freq: Four times a day (QID) | ORAL | Status: DC

## 2012-03-30 SURGICAL SUPPLY — 97 items
ADH SKN CLS APL DERMABOND .7 (GAUZE/BANDAGES/DRESSINGS)
APPLICATOR COTTON TIP 6IN STRL (MISCELLANEOUS) IMPLANT
BAG DECANTER FOR FLEXI CONT (MISCELLANEOUS) ×1 IMPLANT
BALL CTTN LRG ABS STRL LF (GAUZE/BANDAGES/DRESSINGS)
BANDAGE ELASTIC 4 VELCRO ST LF (GAUZE/BANDAGES/DRESSINGS) IMPLANT
BANDAGE GAUZE ELAST BULKY 4 IN (GAUZE/BANDAGES/DRESSINGS) IMPLANT
BINDER BREAST XLRG (GAUZE/BANDAGES/DRESSINGS) ×1 IMPLANT
BIOPATCH RED 1 DISK 7.0 (GAUZE/BANDAGES/DRESSINGS) ×1 IMPLANT
BLADE HEX COATED 2.75 (ELECTRODE) ×1 IMPLANT
BLADE SURG 10 STRL SS (BLADE) ×2 IMPLANT
BLADE SURG 15 STRL LF DISP TIS (BLADE) ×2 IMPLANT
BLADE SURG 15 STRL SS (BLADE) ×4
BLADE SURG ROTATE 9660 (MISCELLANEOUS) IMPLANT
CANISTER SUCTION 1200CC (MISCELLANEOUS) ×1 IMPLANT
CANISTER SUCTION 2500CC (MISCELLANEOUS) ×2 IMPLANT
CHLORAPREP W/TINT 26ML (MISCELLANEOUS) ×1 IMPLANT
CLOTH BEACON ORANGE TIMEOUT ST (SAFETY) ×2 IMPLANT
COTTONBALL LRG STERILE PKG (GAUZE/BANDAGES/DRESSINGS) IMPLANT
COVER MAYO STAND STRL (DRAPES) ×4 IMPLANT
COVER TABLE BACK 60X90 (DRAPES) ×4 IMPLANT
DECANTER SPIKE VIAL GLASS SM (MISCELLANEOUS) IMPLANT
DERMABOND ADVANCED (GAUZE/BANDAGES/DRESSINGS)
DERMABOND ADVANCED .7 DNX12 (GAUZE/BANDAGES/DRESSINGS) IMPLANT
DRAIN CHANNEL 19F RND (DRAIN) ×1 IMPLANT
DRAPE EXTREMITY T 121X128X90 (DRAPE) IMPLANT
DRAPE LAPAROSCOPIC ABDOMINAL (DRAPES) ×1 IMPLANT
DRAPE PED LAPAROTOMY (DRAPES) IMPLANT
DRAPE U-SHAPE 76X120 STRL (DRAPES) IMPLANT
DRAPE UTILITY XL STRL (DRAPES) ×2 IMPLANT
DRSG PAD ABDOMINAL 8X10 ST (GAUZE/BANDAGES/DRESSINGS) ×3 IMPLANT
DRSG TEGADERM 4X4.75 (GAUZE/BANDAGES/DRESSINGS) ×1 IMPLANT
ELECT NDL TIP 2.8 STRL (NEEDLE) IMPLANT
ELECT NEEDLE TIP 2.8 STRL (NEEDLE) IMPLANT
ELECT REM PT RETURN 9FT ADLT (ELECTROSURGICAL) ×4
ELECTRODE REM PT RTRN 9FT ADLT (ELECTROSURGICAL) ×2 IMPLANT
EVACUATOR SILICONE 100CC (DRAIN) ×1 IMPLANT
GAUZE SPONGE 4X4 12PLY STRL LF (GAUZE/BANDAGES/DRESSINGS) IMPLANT
GAUZE SPONGE 4X4 16PLY XRAY LF (GAUZE/BANDAGES/DRESSINGS) IMPLANT
GAUZE XEROFORM 1X8 LF (GAUZE/BANDAGES/DRESSINGS) ×2 IMPLANT
GAUZE XEROFORM 5X9 LF (GAUZE/BANDAGES/DRESSINGS) ×1 IMPLANT
GLOVE BIO SURGEON STRL SZ 6.5 (GLOVE) ×1 IMPLANT
GLOVE BIO SURGEON STRL SZ7.5 (GLOVE) ×3 IMPLANT
GLOVE BIOGEL M STRL SZ7.5 (GLOVE) ×1 IMPLANT
GLOVE BIOGEL PI IND STRL 8 (GLOVE) ×1 IMPLANT
GLOVE BIOGEL PI INDICATOR 8 (GLOVE) ×4
GLOVE EUDERMIC 7 POWDERFREE (GLOVE) ×2 IMPLANT
GOWN PREVENTION PLUS XLARGE (GOWN DISPOSABLE) ×6 IMPLANT
GOWN PREVENTION PLUS XXLARGE (GOWN DISPOSABLE) ×2 IMPLANT
GOWN STRL REIN 2XL LVL4 (GOWN DISPOSABLE) ×2 IMPLANT
HANDPIECE INTERPULSE COAX TIP (DISPOSABLE) ×2
NDL HYPO 25X1 1.5 SAFETY (NEEDLE) IMPLANT
NDL HYPO 30X.5 LL (NEEDLE) ×1 IMPLANT
NEEDLE 27GAX1X1/2 (NEEDLE) IMPLANT
NEEDLE HYPO 25X1 1.5 SAFETY (NEEDLE) IMPLANT
NEEDLE HYPO 30X.5 LL (NEEDLE) ×2 IMPLANT
NS IRRIG 1000ML POUR BTL (IV SOLUTION) ×4 IMPLANT
PACK BASIN DAY SURGERY FS (CUSTOM PROCEDURE TRAY) ×4 IMPLANT
PEN SKIN MARKING BROAD TIP (MISCELLANEOUS) ×2 IMPLANT
PENCIL BUTTON HOLSTER BLD 10FT (ELECTRODE) ×3 IMPLANT
PIN SAFETY STERILE (MISCELLANEOUS) ×1 IMPLANT
SET HNDPC FAN SPRY TIP SCT (DISPOSABLE) IMPLANT
SHEET MEDIUM DRAPE 40X70 STRL (DRAPES) IMPLANT
SLEEVE SCD COMPRESS KNEE MED (MISCELLANEOUS) IMPLANT
SPONGE GAUZE 4X4 12PLY (GAUZE/BANDAGES/DRESSINGS) ×2 IMPLANT
SPONGE LAP 18X18 X RAY DECT (DISPOSABLE) IMPLANT
SPONGE LAP 4X18 X RAY DECT (DISPOSABLE) ×2 IMPLANT
STAPLER VISISTAT 35W (STAPLE) IMPLANT
STRIP SUTURE WOUND CLOSURE 1/2 (SUTURE) IMPLANT
SUCTION FRAZIER TIP 10 FR DISP (SUCTIONS) IMPLANT
SUT ETHILON 3 0 PS 1 (SUTURE) IMPLANT
SUT ETHILON 4 0 P 3 18 (SUTURE) IMPLANT
SUT ETHILON 4 0 PS 2 18 (SUTURE) IMPLANT
SUT ETHILON 5 0 P 3 18 (SUTURE)
SUT MNCRL AB 3-0 PS2 18 (SUTURE) IMPLANT
SUT MNCRL AB 4-0 PS2 18 (SUTURE) IMPLANT
SUT MON AB 4-0 PC3 18 (SUTURE) IMPLANT
SUT NYLON ETHILON 5-0 P-3 1X18 (SUTURE) IMPLANT
SUT PROLENE 2 0 SH DA (SUTURE) ×3 IMPLANT
SUT PROLENE 3 0 PS 1 (SUTURE) ×2 IMPLANT
SUT PROLENE 3 0 PS 2 (SUTURE) IMPLANT
SUT PROLENE 5 0 P 3 (SUTURE) IMPLANT
SUT PROLENE 6 0 P 1 18 (SUTURE) IMPLANT
SUT SILK 4 0 PS 2 (SUTURE) IMPLANT
SUT STRIPS 1/4 X 4 INCH (SUTURE) IMPLANT
SUT VIC AB 4-0 P-3 18XBRD (SUTURE) IMPLANT
SUT VIC AB 4-0 P3 18 (SUTURE)
SUT VIC AB 5-0 P-3 18X BRD (SUTURE) IMPLANT
SUT VIC AB 5-0 P3 18 (SUTURE)
SUT VICRYL 3-0 CR8 SH (SUTURE) IMPLANT
SYR BULB 3OZ (MISCELLANEOUS) ×2 IMPLANT
SYR CONTROL 10ML LL (SYRINGE) ×2 IMPLANT
TOWEL OR 17X24 6PK STRL BLUE (TOWEL DISPOSABLE) ×6 IMPLANT
TOWEL OR NON WOVEN STRL DISP B (DISPOSABLE) ×2 IMPLANT
TUBE CONNECTING 20X1/4 (TUBING) ×2 IMPLANT
VAC PENCILS W/TUBING CLEAR (MISCELLANEOUS) ×1 IMPLANT
WATER STERILE IRR 1000ML POUR (IV SOLUTION) IMPLANT
YANKAUER SUCT BULB TIP NO VENT (SUCTIONS) ×1 IMPLANT

## 2012-03-30 NOTE — H&P (View-Only) (Signed)
Chief complaint: Postop visit with dehiscence of right mastectomy incision  History of present illness: This patient is postop right modified radical mastectomy. The day after her drain and staples were removed the lateral portion of the wound opened up leaving a cavity. This was 3 days ago. It's been packed with sterile gauze intermittently by the patient's husband and she comes back for followup today.  Exam: Vital signs:BP 130/92  Pulse 68  Temp 97.4 F (36.3 C) (Temporal)  Resp 20  Ht 5' 2" (1.575 m)  Wt 179 lb (81.194 kg)  BMI 32.74 kg/m2 Breasts: The medial two thirds of the incision is healing okay. There is an opening laterally with some extra skin in the axilla. It is clean. The inferior flap appears stuck down. There does not appear to be a lot of lymphatic drainage.  Impression: Postop wound dehiscence of mastectomy incision  Plan: I discussed the situation with her plastic surgeon. He'll evaluate her on Monday. Tentatively, we are planning to take her back to the operating room to do a flap closure later in the week. I discussed that with the patient had her husband. 

## 2012-03-30 NOTE — Anesthesia Postprocedure Evaluation (Signed)
Anesthesia Post Note  Patient: Kimberly [name]  Procedure(s) Performed: Procedure(s) (LRB): SCAR REVISION (Right) DEBRIDEMENT AND CLOSURE WOUND (Right)  Anesthesia type: General  Patient location: PACU  Post pain: Pain level controlled and Adequate analgesia  Post assessment: Post-op Vital signs reviewed, Patient's Cardiovascular Status Stable, Respiratory Function Stable, Patent Airway and Pain level controlled  Last Vitals:  Filed Vitals:   03/30/12 1600  BP: 130/66  Pulse: 114  Temp:   Resp: 21    Post vital signs: Reviewed and stable  Level of consciousness: awake, alert  and oriented  Complications: No apparent anesthesia complications

## 2012-03-30 NOTE — Anesthesia Preprocedure Evaluation (Signed)
Anesthesia Evaluation  Patient identified by MRN, date of birth, ID band Patient awake    Reviewed: Allergy & Precautions, H&P , NPO status , Patient's Chart, lab work & pertinent test results  History of Anesthesia Complications (+) PONV  Airway Mallampati: II  Neck ROM: full    Dental   Pulmonary          Cardiovascular hypertension,     Neuro/Psych    GI/Hepatic   Endo/Other  Type 2  Renal/GU      Musculoskeletal   Abdominal   Peds  Hematology   Anesthesia Other Findings   Reproductive/Obstetrics                           Anesthesia Physical Anesthesia Plan  ASA: II  Anesthesia Plan: General   Post-op Pain Management:    Induction: Intravenous  Airway Management Planned: Oral ETT  Additional Equipment:   Intra-op Plan:   Post-operative Plan:   Informed Consent: I have reviewed the patients History and Physical, chart, labs and discussed the procedure including the risks, benefits and alternatives for the proposed anesthesia with the patient or authorized representative who has indicated his/her understanding and acceptance.     Plan Discussed with: CRNA and Surgeon  Anesthesia Plan Comments:         Anesthesia Quick Evaluation

## 2012-03-30 NOTE — Brief Op Note (Signed)
03/30/2012  4:03 PM  PATIENT:  Jethro Bolus  56 y.o. female  PRE-OPERATIVE DIAGNOSIS:  WOUND DEHISCENCE RIGHT BREAST; Breast cancer  POST-OPERATIVE DIAGNOSIS:  WOUND DEHISCENCE RIGHT BREAST;Breast cancer  PROCEDURE:  Procedure(s) (LRB): Wound debridement right chest with superior advancement flap and inferior advancement flap for wound closure right chest  SURGEON:  Surgeon(s) and Role: Panel 1:    * Rossie Muskrat     Primary  PHYSICIAN ASSISTANT:   ASSISTANTS: Dr. Jamey Ripa   ANESTHESIA:   general  EBL:  Total I/O In: 1700 [I.V.:1700] Out: -   BLOOD ADMINISTERED:none  DRAINS: (1) Jackson-Pratt drain(s) with closed bulb suction in the right chest wound   LOCAL MEDICATIONS USED:  NONE  SPECIMEN:  Source of Specimen:  Superior and inferior mastectomy flaps  DISPOSITION OF SPECIMEN:  PATHOLOGY  COUNTS:  YES  TOURNIQUET:  * No tourniquets in log *  DICTATION: .Other Dictation: Dictation Number [id number]  PLAN OF CARE: Discharge to home after PACU  PATIENT DISPOSITION:  PACU - hemodynamically stable.   Delay start of Pharmacological VTE agent (>24hrs) due to surgical blood loss or risk of bleeding: yes

## 2012-03-30 NOTE — Interval H&P Note (Signed)
History and Physical Interval Note:  03/30/2012 1:45 PM  Kimberly [name]  has presented today for surgery, with the diagnosis of WOUND DEHISCENSE RIGHT BREAST  The various methods of treatment have been discussed with the patient and family. After consideration of risks, benefits and other options for treatment, the patient has consented to  Procedure(s) (LRB): SCAR REVISION (Right) DEBRIDEMENT AND CLOSURE WOUND (Right) as a surgical intervention .  The patient's history has been reviewed, patient examined, no change in status, stable for surgery.  I have reviewed the patient's chart and labs.  Questions were answered to the patient's satisfaction.  Told her that most likely this will be all done by Dr Odis Luster as an advancement flap, but I will be present in case needed   Kimberly [name] J

## 2012-03-30 NOTE — Transfer of Care (Signed)
Immediate Anesthesia Transfer of Care Note  Patient: Kimberly [name]  Procedure(s) Performed: Procedure(s) (LRB): SCAR REVISION (Right) DEBRIDEMENT AND CLOSURE WOUND (Right)  Patient Location: PACU  Anesthesia Type: General  Level of Consciousness: awake, alert  and oriented  Airway & Oxygen Therapy: Patient Spontanous Breathing and Patient connected to face mask oxygen  Post-op Assessment: Report given to PACU RN and Post -op Vital signs reviewed and stable  Post vital signs: Reviewed and stable  Complications: No apparent anesthesia complications

## 2012-03-30 NOTE — H&P (Signed)
I have re-examined and re-evaluated the patient and there are no changes. 

## 2012-03-30 NOTE — Anesthesia Procedure Notes (Signed)
Procedure Name: LMA Insertion Date/Time: 03/30/2012 2:26 PM Performed by: Lashaya Kienitz D Pre-anesthesia Checklist: Patient identified, Emergency Drugs available, Suction available and Patient being monitored Patient Re-evaluated:Patient Re-evaluated prior to inductionOxygen Delivery Method: Circle System Utilized Preoxygenation: Pre-oxygenation with 100% oxygen Intubation Type: IV induction Ventilation: Mask ventilation without difficulty LMA: LMA inserted LMA Size: 4.0 Number of attempts: 1 Airway Equipment and Method: bite block Placement Confirmation: positive ETCO2 Tube secured with: Tape Dental Injury: Teeth and Oropharynx as per pre-operative assessment

## 2012-03-31 ENCOUNTER — Encounter (HOSPITAL_BASED_OUTPATIENT_CLINIC_OR_DEPARTMENT_OTHER): Payer: Self-pay | Admitting: Surgery

## 2012-03-31 ENCOUNTER — Encounter (INDEPENDENT_AMBULATORY_CARE_PROVIDER_SITE_OTHER): Payer: BC Managed Care – PPO | Admitting: Surgery

## 2012-03-31 NOTE — Op Note (Deleted)
NAME:  Kimberly [name], Kimberly [name]            ACCOUNT NO.:  [id number]  MEDICAL RECORD NO.:  [id number]  LOCATION:  [facility]                         FACILITY:  [facility]  PHYSICIAN:  Shalondra Wunschel, M.D.     DATE OF BIRTH:  09/06/1955  DATE OF PROCEDURE:  03/30/2012 DATE OF DISCHARGE:                              OPERATIVE REPORT   PREOPERATIVE DIAGNOSES: 1. Breast cancer. 2. Right mastectomy, wound dehiscence.  PROCEDURE PERFORMED: 1. Wound debridement, right chest. 2. Superior chest advancement flap greater than 30 cm2. 3. Inferior chest advancement flap, greater than 30 cm2.  SURGEON:  Macarthur Lorusso, MD.  ASSISTANT:  Christian J. Streck, MD  ANESTHESIA:  General.  ESTIMATED BLOOD LOSS:  50 mL.  DRAINS:  One 19-French.  CLINICAL NOTE:  A 56-year-old woman, who has had bilateral mastectomy and had a right breast cancer.  She had reconstruction on the left side with a tissue expander, but not on the right side where it was expected that she would need to undergo radiation treatment.  She developed a wound dehiscence on the right side, and has had an open wound in the right chest.  She needs to move on towards radiation treatment and this wound needs to be closed in an attempt to gain a healed wound, and it was felt that her best option was to debride the wound and re-advance the superior mastectomy flap and the inferior mastectomy flap, and then close the wound and hope that we could achieve a healed wound in preparation for radiation treatment.  The nature of this procedure and the risks and possible complications were discussed with her including, not limited to, bleeding, infection, anesthesia complications, healing problems, scarring, loss of sensation, fluid accumulations, loss of tissue, loss of skin, damage to deeper structures, pulmonary embolism, and scarring issues and she understood all of these and wished to proceed.  She clearly understood that she is at increased risk for  wound dehiscence because she has not had a very good appetite and she is a diabetic.  DESCRIPTION:  The patient was taken to the operating room, placed supine.  After successful induction of general anesthesia, she was prepped with Betadine and draped with sterile drapes including impervious drapes.  The wound was debrided, 1st the base of the wound and this was a very significant deep wound that involved the axilla as well and this was debrided with a curette.  The wound edges were excised sharply removing the necrotic tissue, and then there was bright red bleeding noted from these flaps both superiorly and inferiorly.  She was then irrigated with saline using the pulse lavage system, a total of 3 L of saline using pulse lavage.  Antibiotic solution was placed and was allowed to remain in the wound for the closure portion.  A 19-French drain was positioned, brought through separate stab wound inferolaterally and secured with a 3-0 Prolene suture.  The wound bed was inspected and it was noted to have very good hemostasis.  The flaps were advanced undermining both superiorly and inferiorly in order to advance the flaps to close this wound.  This was a significant undermining to advance the flaps for the closure.  At the conclusion of   this dissection, this wound edges continued to have excellent color and bright blood red bleeding along the periphery consistent with viability. The wound closures with 2-0 and 3-0 Prolene simple interrupted with an occasional vertical mattress suture as needed for wound edge eversion. Xeroform gauze and dry sterile dressing applied, and a Biopatch with a Tegaderm applied for the drain and dry sterile dressings then applied to the wounds including Xeroform gauze, and the chest vest.  She was transferred to the recovery in stable having tolerated procedure well.  DISPOSITION:  She will be discharged home if her blood sugar is stable.     Kaitlyn Franko,  M.D.     DB/MEDQ  D:  03/30/2012  T:  03/31/2012  Job:  [id number] 

## 2012-03-31 NOTE — Op Note (Signed)
NAME:  Kimberly [name], Kimberly [name] NO.:  [id number]  MEDICAL RECORD NO.:  [id number]  LOCATION:  [facility]                         FACILITY:  [facility]  PHYSICIAN:  Etter Sjogren, M.D.     DATE OF BIRTH:  20-Feb-1956  DATE OF PROCEDURE:  03/30/2012 DATE OF DISCHARGE:                              OPERATIVE REPORT   PREOPERATIVE DIAGNOSES: 1. Breast cancer. 2. Right mastectomy, wound dehiscence.  PROCEDURE PERFORMED: 1. Wound debridement, right chest. 2. Superior chest advancement flap greater than 30 cm2. 3. Inferior chest advancement flap, greater than 30 cm2.  SURGEON:  Etter Sjogren, MD.  ASSISTANT:  Currie Paris, MD  ANESTHESIA:  General.  ESTIMATED BLOOD LOSS:  50 mL.  DRAINS:  One 19-French.  CLINICAL NOTE:  A 56 year old woman, who has had bilateral mastectomy and had a right breast cancer.  She had reconstruction on the left side with a tissue expander, but not on the right side where it was expected that she would need to undergo radiation treatment.  She developed a wound dehiscence on the right side, and has had an open wound in the right chest.  She needs to move on towards radiation treatment and this wound needs to be closed in an attempt to gain a healed wound, and it was felt that her best option was to debride the wound and re-advance the superior mastectomy flap and the inferior mastectomy flap, and then close the wound and hope that we could achieve a healed wound in preparation for radiation treatment.  The nature of this procedure and the risks and possible complications were discussed with her including, not limited to, bleeding, infection, anesthesia complications, healing problems, scarring, loss of sensation, fluid accumulations, loss of tissue, loss of skin, damage to deeper structures, pulmonary embolism, and scarring issues and she understood all of these and wished to proceed.  She clearly understood that she is at increased risk for  wound dehiscence because she has not had a very good appetite and she is a diabetic.  DESCRIPTION:  The patient was taken to the operating room, placed supine.  After successful induction of general anesthesia, she was prepped with Betadine and draped with sterile drapes including impervious drapes.  The wound was debrided, 1st the base of the wound and this was a very significant deep wound that involved the axilla as well and this was debrided with a curette.  The wound edges were excised sharply removing the necrotic tissue, and then there was bright red bleeding noted from these flaps both superiorly and inferiorly.  She was then irrigated with saline using the pulse lavage system, a total of 3 L of saline using pulse lavage.  Antibiotic solution was placed and was allowed to remain in the wound for the closure portion.  A 19-French drain was positioned, brought through separate stab wound inferolaterally and secured with a 3-0 Prolene suture.  The wound bed was inspected and it was noted to have very good hemostasis.  The flaps were advanced undermining both superiorly and inferiorly in order to advance the flaps to close this wound.  This was a significant undermining to advance the flaps for the closure.  At the conclusion of  this dissection, this wound edges continued to have excellent color and bright blood red bleeding along the periphery consistent with viability. The wound closures with 2-0 and 3-0 Prolene simple interrupted with an occasional vertical mattress suture as needed for wound edge eversion. Xeroform gauze and dry sterile dressing applied, and a Biopatch with a Tegaderm applied for the drain and dry sterile dressings then applied to the wounds including Xeroform gauze, and the chest vest.  She was transferred to the recovery in stable having tolerated procedure well.  DISPOSITION:  She will be discharged home if her blood sugar is stable.     Etter Sjogren,  M.D.     DB/MEDQ  D:  03/30/2012  T:  03/31/2012  Job:  [id number]

## 2012-04-05 ENCOUNTER — Encounter (INDEPENDENT_AMBULATORY_CARE_PROVIDER_SITE_OTHER): Payer: BC Managed Care – PPO | Admitting: Surgery

## 2012-04-05 ENCOUNTER — Encounter (HOSPITAL_BASED_OUTPATIENT_CLINIC_OR_DEPARTMENT_OTHER): Payer: Self-pay

## 2012-04-30 ENCOUNTER — Ambulatory Visit: Payer: Self-pay | Admitting: Oncology

## 2012-05-05 LAB — CBC CANCER CENTER
Basophil %: 1.2 %
Eosinophil %: 4.7 %
HGB: 10.7 g/dL — ABNORMAL LOW (ref 12.0–16.0)
Lymphocyte %: 32.2 %
MCH: 26.7 pg (ref 26.0–34.0)
Monocyte %: 7.6 %
Neutrophil %: 54.3 %
Platelet: 397 x10 3/mm (ref 150–440)
RBC: 4.01 10*6/uL (ref 3.80–5.20)
WBC: 5.7 x10 3/mm (ref 3.6–11.0)

## 2012-05-05 LAB — COMPREHENSIVE METABOLIC PANEL
Albumin: 3.4 g/dL (ref 3.4–5.0)
Anion Gap: 7 (ref 7–16)
BUN: 10 mg/dL (ref 7–18)
Glucose: 198 mg/dL — ABNORMAL HIGH (ref 65–99)
Potassium: 4.2 mmol/L (ref 3.5–5.1)
SGOT(AST): 23 U/L (ref 15–37)
Sodium: 138 mmol/L (ref 136–145)
Total Protein: 6.8 g/dL (ref 6.4–8.2)

## 2012-05-26 LAB — CBC CANCER CENTER
Eosinophil #: 0.1 x10 3/mm (ref 0.0–0.7)
Lymphocyte #: 1.4 x10 3/mm (ref 1.0–3.6)
MCV: 81 fL (ref 80–100)
Monocyte #: 0.3 x10 3/mm (ref 0.2–0.9)
Neutrophil %: 57.7 %
Platelet: 371 x10 3/mm (ref 150–440)
RBC: 4.31 10*6/uL (ref 3.80–5.20)
WBC: 4.5 x10 3/mm (ref 3.6–11.0)

## 2012-05-30 ENCOUNTER — Ambulatory Visit: Payer: Self-pay | Admitting: Oncology

## 2012-06-02 LAB — CBC CANCER CENTER
Basophil #: 0.1 x10 3/mm (ref 0.0–0.1)
Eosinophil #: 0.2 x10 3/mm (ref 0.0–0.7)
Lymphocyte #: 1.3 x10 3/mm (ref 1.0–3.6)
Lymphocyte %: 27.9 %
Monocyte #: 0.4 x10 3/mm (ref 0.2–0.9)
Monocyte %: 7.6 %
Neutrophil %: 59.8 %
Platelet: 369 x10 3/mm (ref 150–440)
RDW: 17.3 % — ABNORMAL HIGH (ref 11.5–14.5)
WBC: 4.8 x10 3/mm (ref 3.6–11.0)

## 2012-06-09 LAB — CBC CANCER CENTER
Basophil %: 1.2 %
Eosinophil #: 0.1 x10 3/mm (ref 0.0–0.7)
HCT: 36.9 % (ref 35.0–47.0)
HGB: 11.8 g/dL — ABNORMAL LOW (ref 12.0–16.0)
Lymphocyte %: 22.4 %
Monocyte %: 8.7 %
Neutrophil #: 2.7 x10 3/mm (ref 1.4–6.5)
RBC: 4.53 10*6/uL (ref 3.80–5.20)
RDW: 17.3 % — ABNORMAL HIGH (ref 11.5–14.5)
WBC: 4.1 x10 3/mm (ref 3.6–11.0)

## 2012-06-16 LAB — COMPREHENSIVE METABOLIC PANEL
Anion Gap: 5 — ABNORMAL LOW (ref 7–16)
BUN: 11 mg/dL (ref 7–18)
Bilirubin,Total: 0.3 mg/dL (ref 0.2–1.0)
Chloride: 101 mmol/L (ref 98–107)
Co2: 29 mmol/L (ref 21–32)
Creatinine: 0.78 mg/dL (ref 0.60–1.30)
EGFR (African American): >60
EGFR (Non-African Amer.): >60
Potassium: 4.1 mmol/L (ref 3.5–5.1)
Total Protein: 7.3 g/dL (ref 6.4–8.2)

## 2012-06-16 LAB — CBC CANCER CENTER
Basophil #: 0.1 x10 3/mm (ref 0.0–0.1)
Eosinophil #: 0.1 x10 3/mm (ref 0.0–0.7)
HCT: 36.1 % (ref 35.0–47.0)
HGB: 11.9 g/dL — ABNORMAL LOW (ref 12.0–16.0)
Lymphocyte #: 0.7 x10 3/mm — ABNORMAL LOW (ref 1.0–3.6)
Monocyte #: 0.3 x10 3/mm (ref 0.2–0.9)
Monocyte %: 8.6 %
Neutrophil #: 2.7 x10 3/mm (ref 1.4–6.5)
RDW: 17.5 % — ABNORMAL HIGH (ref 11.5–14.5)
WBC: 3.9 x10 3/mm (ref 3.6–11.0)

## 2012-06-23 LAB — CBC CANCER CENTER
Eosinophil #: 0.1 x10 3/mm (ref 0.0–0.7)
HCT: 35.3 % (ref 35.0–47.0)
MCHC: 32.4 g/dL (ref 32.0–36.0)
MCV: 82 fL (ref 80–100)
Neutrophil #: 2.5 x10 3/mm (ref 1.4–6.5)
RDW: 17.3 % — ABNORMAL HIGH (ref 11.5–14.5)

## 2012-06-30 ENCOUNTER — Ambulatory Visit: Payer: Self-pay | Admitting: Oncology

## 2012-06-30 LAB — CBC CANCER CENTER
Basophil %: 1.2 %
Eosinophil #: 0.1 x10 3/mm (ref 0.0–0.7)
Eosinophil %: 2.6 %
HGB: 11.7 g/dL — ABNORMAL LOW (ref 12.0–16.0)
Lymphocyte #: 0.8 x10 3/mm — ABNORMAL LOW (ref 1.0–3.6)
MCH: 26.8 pg (ref 26.0–34.0)
MCHC: 32.3 g/dL (ref 32.0–36.0)
MCV: 83 fL (ref 80–100)
Monocyte #: 0.4 x10 3/mm (ref 0.2–0.9)
Neutrophil %: 69.9 %
Platelet: 335 x10 3/mm (ref 150–440)

## 2012-07-07 LAB — CBC CANCER CENTER
Eosinophil %: 2.4 %
Lymphocyte %: 16.8 %
Monocyte %: 9.4 %
Neutrophil %: 70.3 %
Platelet: 307 x10 3/mm (ref 150–440)
RBC: 4.09 10*6/uL (ref 3.80–5.20)
WBC: 4 x10 3/mm (ref 3.6–11.0)

## 2012-07-07 LAB — COMPREHENSIVE METABOLIC PANEL
Alkaline Phosphatase: 87 U/L (ref 50–136)
Calcium, Total: 8.9 mg/dL (ref 8.5–10.1)
Chloride: 101 mmol/L (ref 98–107)
Co2: 28 mmol/L (ref 21–32)
Creatinine: 0.79 mg/dL (ref 0.60–1.30)
EGFR (African American): >60
EGFR (Non-African Amer.): >60
Osmolality: 280 (ref 275–301)
Potassium: 4 mmol/L (ref 3.5–5.1)
SGPT (ALT): 31 U/L (ref 12–78)
Sodium: 137 mmol/L (ref 136–145)

## 2012-07-30 ENCOUNTER — Ambulatory Visit: Payer: Self-pay | Admitting: Oncology

## 2012-08-04 LAB — BASIC METABOLIC PANEL
BUN: 15 mg/dL (ref 7–18)
Calcium, Total: 8.8 mg/dL (ref 8.5–10.1)
Chloride: 103 mmol/L (ref 98–107)
Osmolality: 284 (ref 275–301)
Potassium: 4 mmol/L (ref 3.5–5.1)

## 2012-08-04 LAB — CBC CANCER CENTER
Basophil #: 0 x10 3/mm (ref 0.0–0.1)
Eosinophil %: 2.3 %
HGB: 10.9 g/dL — ABNORMAL LOW (ref 12.0–16.0)
Lymphocyte #: 0.9 x10 3/mm — ABNORMAL LOW (ref 1.0–3.6)
MCV: 82 fL (ref 80–100)
Monocyte #: 0.3 x10 3/mm (ref 0.2–0.9)
Monocyte %: 6.1 %
Neutrophil %: 73 %
Platelet: 336 x10 3/mm (ref 150–440)
RDW: 16.2 % — ABNORMAL HIGH (ref 11.5–14.5)
WBC: 5.3 x10 3/mm (ref 3.6–11.0)

## 2012-08-14 LAB — URINALYSIS, COMPLETE
Blood: NEGATIVE
Glucose,UR: NEGATIVE mg/dL (ref 0–75)
Leukocyte Esterase: NEGATIVE
Specific Gravity: >=1.03 (ref 1.003–1.030)

## 2012-08-14 LAB — COMPREHENSIVE METABOLIC PANEL
Albumin: 3.6 g/dL (ref 3.4–5.0)
Anion Gap: 10 (ref 7–16)
BUN: 14 mg/dL (ref 7–18)
Calcium, Total: 9.6 mg/dL (ref 8.5–10.1)
Chloride: 101 mmol/L (ref 98–107)
Co2: 27 mmol/L (ref 21–32)
Creatinine: 0.84 mg/dL (ref 0.60–1.30)
EGFR (African American): >60
Glucose: 169 mg/dL — ABNORMAL HIGH (ref 65–99)
Osmolality: 280 (ref 275–301)
Potassium: 4.7 mmol/L (ref 3.5–5.1)
SGOT(AST): 22 U/L (ref 15–37)
Sodium: 138 mmol/L (ref 136–145)

## 2012-08-14 LAB — CBC CANCER CENTER
Basophil #: 0.1 x10 3/mm (ref 0.0–0.1)
Basophil %: 0.8 %
Eosinophil #: 0 x10 3/mm (ref 0.0–0.7)
HCT: 31.7 % — ABNORMAL LOW (ref 35.0–47.0)
HGB: 10.6 g/dL — ABNORMAL LOW (ref 12.0–16.0)
Lymphocyte #: 1.1 x10 3/mm (ref 1.0–3.6)
Lymphocyte %: 15.4 %
MCHC: 33.3 g/dL (ref 32.0–36.0)
Monocyte #: 0.4 x10 3/mm (ref 0.2–0.9)
Neutrophil %: 77.7 %
Platelet: 547 x10 3/mm — ABNORMAL HIGH (ref 150–440)
RBC: 3.91 10*6/uL (ref 3.80–5.20)
RDW: 15.7 % — ABNORMAL HIGH (ref 11.5–14.5)

## 2012-08-14 LAB — TSH: Thyroid Stimulating Horm: 2.33 u[IU]/mL

## 2012-08-17 ENCOUNTER — Ambulatory Visit: Payer: Self-pay | Admitting: Oncology

## 2012-08-17 LAB — CANCER ANTIGEN 27.29: CA 27.29: 22 U/mL (ref 0.0–38.6)

## 2012-08-30 ENCOUNTER — Ambulatory Visit: Payer: Self-pay | Admitting: Oncology

## 2012-09-01 LAB — COMPREHENSIVE METABOLIC PANEL
Albumin: 3 g/dL — ABNORMAL LOW (ref 3.4–5.0)
Alkaline Phosphatase: 107 U/L (ref 50–136)
Anion Gap: 10 (ref 7–16)
BUN: 12 mg/dL (ref 7–18)
Bilirubin,Total: 0.3 mg/dL (ref 0.2–1.0)
Calcium, Total: 9.2 mg/dL (ref 8.5–10.1)
Chloride: 99 mmol/L (ref 98–107)
Co2: 28 mmol/L (ref 21–32)
Creatinine: 0.81 mg/dL (ref 0.60–1.30)
EGFR (African American): >60
EGFR (Non-African Amer.): >60
Glucose: 225 mg/dL — ABNORMAL HIGH (ref 65–99)
Osmolality: 281 (ref 275–301)
Potassium: 3.2 mmol/L — ABNORMAL LOW (ref 3.5–5.1)
SGOT(AST): 13 U/L — ABNORMAL LOW (ref 15–37)
SGPT (ALT): 20 U/L (ref 12–78)
Sodium: 137 mmol/L (ref 136–145)
Total Protein: 7.7 g/dL (ref 6.4–8.2)

## 2012-09-01 LAB — CBC CANCER CENTER
Basophil #: 0.1 x10 3/mm (ref 0.0–0.1)
Basophil %: 0.9 %
Eosinophil #: 0.1 x10 3/mm (ref 0.0–0.7)
Eosinophil %: 1.3 %
HCT: 30 % — ABNORMAL LOW (ref 35.0–47.0)
HGB: 10.1 g/dL — ABNORMAL LOW (ref 12.0–16.0)
Lymphocyte #: 1 x10 3/mm (ref 1.0–3.6)
MCH: 27 pg (ref 26.0–34.0)
MCHC: 33.7 g/dL (ref 32.0–36.0)
MCV: 80 fL (ref 80–100)
Monocyte #: 0.6 x10 3/mm (ref 0.2–0.9)
Monocyte %: 9.8 %
Neutrophil #: 4.1 x10 3/mm (ref 1.4–6.5)
Neutrophil %: 71.1 %
WBC: 5.8 x10 3/mm (ref 3.6–11.0)

## 2012-09-22 LAB — CBC CANCER CENTER
Basophil #: 0.1 x10 3/mm (ref 0.0–0.1)
Basophil %: 1.1 %
Eosinophil %: 3.3 %
HCT: 32.4 % — ABNORMAL LOW (ref 35.0–47.0)
Lymphocyte #: 1.2 x10 3/mm (ref 1.0–3.6)
Lymphocyte %: 26.1 %
MCH: 27 pg (ref 26.0–34.0)
MCV: 82 fL (ref 80–100)
Monocyte #: 0.4 x10 3/mm (ref 0.2–0.9)
Monocyte %: 7.6 %
Neutrophil #: 3 x10 3/mm (ref 1.4–6.5)
Neutrophil %: 61.9 %
RDW: 16.3 % — ABNORMAL HIGH (ref 11.5–14.5)
WBC: 4.8 x10 3/mm (ref 3.6–11.0)

## 2012-09-22 LAB — COMPREHENSIVE METABOLIC PANEL
Alkaline Phosphatase: 91 U/L (ref 50–136)
Chloride: 102 mmol/L (ref 98–107)
Co2: 24 mmol/L (ref 21–32)
EGFR (African American): >60
EGFR (Non-African Amer.): >60
Osmolality: 274 (ref 275–301)
Potassium: 4.4 mmol/L (ref 3.5–5.1)
SGOT(AST): 20 U/L (ref 15–37)
Total Protein: 6.9 g/dL (ref 6.4–8.2)

## 2012-09-22 LAB — IRON AND TIBC
Iron Bind.Cap.(Total): 341 ug/dL (ref 250–450)
Iron Saturation: 17 %

## 2012-09-22 LAB — FERRITIN: Ferritin (ARMC): 133 ng/mL (ref 8–388)

## 2012-09-30 ENCOUNTER — Ambulatory Visit: Payer: Self-pay | Admitting: Oncology

## 2012-10-13 LAB — CBC CANCER CENTER
Basophil #: 0.1 x10 3/mm (ref 0.0–0.1)
Eosinophil #: 0.1 x10 3/mm (ref 0.0–0.7)
Eosinophil %: 1.9 %
HGB: 11.8 g/dL — ABNORMAL LOW (ref 12.0–16.0)
Lymphocyte #: 1.4 x10 3/mm (ref 1.0–3.6)
Lymphocyte %: 25.1 %
MCHC: 33.2 g/dL (ref 32.0–36.0)
MCV: 82 fL (ref 80–100)
Monocyte #: 0.5 x10 3/mm (ref 0.2–0.9)
Monocyte %: 8.5 %
Neutrophil #: 3.5 x10 3/mm (ref 1.4–6.5)
Platelet: 405 x10 3/mm (ref 150–440)
RBC: 4.36 10*6/uL (ref 3.80–5.20)
RDW: 16.6 % — ABNORMAL HIGH (ref 11.5–14.5)
WBC: 5.5 x10 3/mm (ref 3.6–11.0)

## 2012-10-13 LAB — COMPREHENSIVE METABOLIC PANEL
Albumin: 3.9 g/dL (ref 3.4–5.0)
Calcium, Total: 10.1 mg/dL (ref 8.5–10.1)
Chloride: 97 mmol/L — ABNORMAL LOW (ref 98–107)
Co2: 27 mmol/L (ref 21–32)
EGFR (African American): 45 — ABNORMAL LOW
EGFR (Non-African Amer.): 39 — ABNORMAL LOW
Osmolality: 286 (ref 275–301)
Potassium: 4.1 mmol/L (ref 3.5–5.1)
Sodium: 138 mmol/L (ref 136–145)

## 2012-10-28 ENCOUNTER — Ambulatory Visit: Payer: Self-pay | Admitting: Oncology

## 2012-11-07 LAB — COMPREHENSIVE METABOLIC PANEL
Alkaline Phosphatase: 81 U/L (ref 50–136)
Anion Gap: 11 (ref 7–16)
BUN: 20 mg/dL — ABNORMAL HIGH (ref 7–18)
Bilirubin,Total: 0.3 mg/dL (ref 0.2–1.0)
Calcium, Total: 8.9 mg/dL (ref 8.5–10.1)
Chloride: 101 mmol/L (ref 98–107)
Co2: 25 mmol/L (ref 21–32)
Glucose: 169 mg/dL — ABNORMAL HIGH (ref 65–99)
Osmolality: 280 (ref 275–301)
SGOT(AST): 19 U/L (ref 15–37)
SGPT (ALT): 31 U/L (ref 12–78)
Sodium: 137 mmol/L (ref 136–145)
Total Protein: 6.7 g/dL (ref 6.4–8.2)

## 2012-11-07 LAB — CBC CANCER CENTER
Basophil #: 0.1 x10 3/mm (ref 0.0–0.1)
Basophil %: 1 %
Eosinophil %: 1.9 %
HCT: 31.5 % — ABNORMAL LOW (ref 35.0–47.0)
Lymphocyte #: 1.2 x10 3/mm (ref 1.0–3.6)
MCH: 27.7 pg (ref 26.0–34.0)
MCHC: 34 g/dL (ref 32.0–36.0)
MCV: 81 fL (ref 80–100)
Monocyte #: 0.3 x10 3/mm (ref 0.2–0.9)
Monocyte %: 6.6 %
Neutrophil %: 64.7 %
Platelet: 373 x10 3/mm (ref 150–440)
RBC: 3.87 10*6/uL (ref 3.80–5.20)
WBC: 4.8 x10 3/mm (ref 3.6–11.0)

## 2012-11-28 ENCOUNTER — Ambulatory Visit: Payer: Self-pay | Admitting: Oncology

## 2012-12-01 LAB — CBC CANCER CENTER
Basophil #: 0.1 x10 3/mm (ref 0.0–0.1)
HCT: 34.3 % — ABNORMAL LOW (ref 35.0–47.0)
MCHC: 32.6 g/dL (ref 32.0–36.0)
Monocyte #: 0.4 x10 3/mm (ref 0.2–0.9)
Monocyte %: 6.3 %
Neutrophil #: 4.3 x10 3/mm (ref 1.4–6.5)
Platelet: 403 x10 3/mm (ref 150–440)
RBC: 4.19 10*6/uL (ref 3.80–5.20)
RDW: 16.2 % — ABNORMAL HIGH (ref 11.5–14.5)
WBC: 6.2 x10 3/mm (ref 3.6–11.0)

## 2012-12-01 LAB — COMPREHENSIVE METABOLIC PANEL
Albumin: 3.6 g/dL (ref 3.4–5.0)
Alkaline Phosphatase: 91 U/L (ref 50–136)
BUN: 20 mg/dL — ABNORMAL HIGH (ref 7–18)
Bilirubin,Total: 0.3 mg/dL (ref 0.2–1.0)
Co2: 28 mmol/L (ref 21–32)
EGFR (African American): >60
EGFR (Non-African Amer.): >60
Glucose: 159 mg/dL — ABNORMAL HIGH (ref 65–99)
SGOT(AST): 21 U/L (ref 15–37)

## 2012-12-22 LAB — CBC CANCER CENTER
Basophil #: 0.1 x10 3/mm (ref 0.0–0.1)
Eosinophil %: 1.9 %
HGB: 11.1 g/dL — ABNORMAL LOW (ref 12.0–16.0)
Lymphocyte %: 27.2 %
MCHC: 33.5 g/dL (ref 32.0–36.0)
Monocyte %: 6.9 %
Neutrophil #: 3.2 x10 3/mm (ref 1.4–6.5)
Platelet: 360 x10 3/mm (ref 150–440)
RDW: 15.9 % — ABNORMAL HIGH (ref 11.5–14.5)
WBC: 5.1 x10 3/mm (ref 3.6–11.0)

## 2012-12-22 LAB — COMPREHENSIVE METABOLIC PANEL
Albumin: 3.4 g/dL (ref 3.4–5.0)
Alkaline Phosphatase: 82 U/L (ref 50–136)
Anion Gap: 8 (ref 7–16)
BUN: 13 mg/dL (ref 7–18)
Bilirubin,Total: 0.3 mg/dL (ref 0.2–1.0)
Chloride: 103 mmol/L (ref 98–107)
Co2: 28 mmol/L (ref 21–32)
Creatinine: 0.9 mg/dL (ref 0.60–1.30)
EGFR (African American): >60
Glucose: 161 mg/dL — ABNORMAL HIGH (ref 65–99)
Osmolality: 281 (ref 275–301)
SGOT(AST): 21 U/L (ref 15–37)

## 2012-12-28 ENCOUNTER — Ambulatory Visit: Payer: Self-pay | Admitting: Oncology

## 2013-01-12 LAB — COMPREHENSIVE METABOLIC PANEL
Albumin: 3.4 g/dL (ref 3.4–5.0)
Alkaline Phosphatase: 75 U/L (ref 50–136)
BUN: 14 mg/dL (ref 7–18)
Calcium, Total: 8.8 mg/dL (ref 8.5–10.1)
Chloride: 105 mmol/L (ref 98–107)
Co2: 26 mmol/L (ref 21–32)
Creatinine: 0.93 mg/dL (ref 0.60–1.30)
EGFR (African American): >60
EGFR (Non-African Amer.): >60
Osmolality: 285 (ref 275–301)
SGOT(AST): 21 U/L (ref 15–37)
SGPT (ALT): 36 U/L (ref 12–78)

## 2013-01-12 LAB — CBC CANCER CENTER
Eosinophil #: 0.1 x10 3/mm (ref 0.0–0.7)
HCT: 32.4 % — ABNORMAL LOW (ref 35.0–47.0)
HGB: 11 g/dL — ABNORMAL LOW (ref 12.0–16.0)
Lymphocyte #: 1.2 x10 3/mm (ref 1.0–3.6)
MCH: 28.3 pg (ref 26.0–34.0)
MCV: 83 fL (ref 80–100)
Monocyte #: 0.4 x10 3/mm (ref 0.2–0.9)
Monocyte %: 7.6 %
Neutrophil #: 3.1 x10 3/mm (ref 1.4–6.5)
Neutrophil %: 64.9 %
Platelet: 335 x10 3/mm (ref 150–440)
RDW: 15.1 % — ABNORMAL HIGH (ref 11.5–14.5)
WBC: 4.8 x10 3/mm (ref 3.6–11.0)

## 2013-01-25 ENCOUNTER — Encounter (HOSPITAL_COMMUNITY): Payer: Self-pay | Admitting: Pharmacy Technician

## 2013-01-28 ENCOUNTER — Ambulatory Visit: Payer: Self-pay | Admitting: Oncology

## 2013-01-29 ENCOUNTER — Encounter (HOSPITAL_COMMUNITY): Payer: Self-pay

## 2013-01-29 ENCOUNTER — Encounter (HOSPITAL_COMMUNITY)
Admission: RE | Admit: 2013-01-29 | Discharge: 2013-01-29 | Disposition: A | Discharge status: Home / Self Care | Payer: BC Managed Care – PPO | Source: Ambulatory Visit | Attending: Plastic Surgery | Admitting: Plastic Surgery

## 2013-01-29 ENCOUNTER — Other Ambulatory Visit: Payer: Self-pay | Admitting: Plastic Surgery

## 2013-01-29 ENCOUNTER — Ambulatory Visit (HOSPITAL_COMMUNITY)
Admission: RE | Admit: 2013-01-29 | Discharge: 2013-01-29 | Disposition: A | Discharge status: Home / Self Care | Payer: BC Managed Care – PPO | Source: Ambulatory Visit | Attending: Plastic Surgery | Admitting: Plastic Surgery

## 2013-01-29 DIAGNOSIS — E119 Type 2 diabetes mellitus without complications: Secondary | ICD-10-CM | POA: Insufficient documentation

## 2013-01-29 DIAGNOSIS — I10 Essential (primary) hypertension: Secondary | ICD-10-CM | POA: Insufficient documentation

## 2013-01-29 DIAGNOSIS — C50919 Malignant neoplasm of unspecified site of unspecified female breast: Secondary | ICD-10-CM | POA: Insufficient documentation

## 2013-01-29 DIAGNOSIS — Z79899 Other long term (current) drug therapy: Secondary | ICD-10-CM | POA: Insufficient documentation

## 2013-01-29 DIAGNOSIS — Z01818 Encounter for other preprocedural examination: Secondary | ICD-10-CM | POA: Insufficient documentation

## 2013-01-29 DIAGNOSIS — Z01812 Encounter for preprocedural laboratory examination: Secondary | ICD-10-CM | POA: Insufficient documentation

## 2013-01-29 LAB — CBC
HCT: 34.8 % — ABNORMAL LOW (ref 36.0–46.0)
MCH: 28 pg (ref 26.0–34.0)
MCV: 84.9 fL (ref 78.0–100.0)
Platelets: 392 10*3/uL (ref 150–400)
RBC: 4.1 MIL/uL (ref 3.87–5.11)
RDW: 14.7 % (ref 11.5–15.5)

## 2013-01-29 LAB — BASIC METABOLIC PANEL
BUN: 18 mg/dL (ref 6–23)
CO2: 27 mEq/L (ref 19–32)
Calcium: 9.7 mg/dL (ref 8.4–10.5)
Creatinine, Ser: 0.82 mg/dL (ref 0.50–1.10)

## 2013-01-29 LAB — SURGICAL PCR SCREEN: MRSA, PCR: NEGATIVE

## 2013-01-29 NOTE — Progress Notes (Signed)
Primary Physician - Dr. Dorothey Baseman Does not have a cardiologist ekg in epic no other cardiac testing

## 2013-01-29 NOTE — Pre-Procedure Instructions (Signed)
Ailee Pates Flynt  01/29/2013   Your procedure is scheduled on:  Tuesday, June 10th  Report to [facility] at 0530 AM, Come to main entrance "A" go to [employer] up to 3rd floor. Check in at short stay desk.  Call this number if you have problems the morning of surgery: [phone]   Remember:   Do not eat food or drink liquids after midnight.   Take these medicines the morning of surgery with A SIP OF WATER: None   Do not wear jewelry, make-up or nail polish.  Do not wear lotions, powders, or perfumes. You may wear deodorant.  Do not shave 48 hours prior to surgery. Men may shave face and neck.  Do not bring valuables to the hospital.  [facility] is not responsible  for any belongings or valuables.  Contacts, dentures or bridgework may not be worn into surgery.  Leave suitcase in the car. After surgery it may be brought to your room.  For patients admitted to the hospital, checkout time is 11:00 AM the day of discharge.   Patients discharged the day of surgery will not be allowed to drive home.   Special Instructions: Shower using CHG 2 nights before surgery and the night before surgery.  If you shower the day of surgery use CHG.  Use special wash - you have one bottle of CHG for all showers.  You should use approximately 1/3 of the bottle for each shower.   Please read over the following fact sheets that you were given: Pain Booklet, Coughing and Deep Breathing, MRSA Information and Surgical Site Infection Prevention

## 2013-02-02 LAB — COMPREHENSIVE METABOLIC PANEL
Alkaline Phosphatase: 73 U/L (ref 50–136)
Anion Gap: 10 (ref 7–16)
BUN: 19 mg/dL — ABNORMAL HIGH (ref 7–18)
Bilirubin,Total: 0.3 mg/dL (ref 0.2–1.0)
Calcium, Total: 8.7 mg/dL (ref 8.5–10.1)
Chloride: 102 mmol/L (ref 98–107)
Co2: 27 mmol/L (ref 21–32)
Glucose: 137 mg/dL — ABNORMAL HIGH (ref 65–99)
Potassium: 4.1 mmol/L (ref 3.5–5.1)
SGOT(AST): 22 U/L (ref 15–37)
SGPT (ALT): 33 U/L (ref 12–78)
Sodium: 139 mmol/L (ref 136–145)
Total Protein: 6.9 g/dL (ref 6.4–8.2)

## 2013-02-02 LAB — CBC CANCER CENTER
Basophil #: 0.1 x10 3/mm (ref 0.0–0.1)
Basophil %: 1.4 %
Eosinophil #: 0.1 x10 3/mm (ref 0.0–0.7)
HGB: 10.8 g/dL — ABNORMAL LOW (ref 12.0–16.0)
Lymphocyte #: 1.6 x10 3/mm (ref 1.0–3.6)
Lymphocyte %: 32.7 %
MCH: 27.8 pg (ref 26.0–34.0)
MCHC: 33.3 g/dL (ref 32.0–36.0)
Monocyte #: 0.4 x10 3/mm (ref 0.2–0.9)
Neutrophil #: 2.7 x10 3/mm (ref 1.4–6.5)
Platelet: 349 x10 3/mm (ref 150–440)
RBC: 3.88 10*6/uL (ref 3.80–5.20)
RDW: 14.9 % — ABNORMAL HIGH (ref 11.5–14.5)
WBC: 4.8 x10 3/mm (ref 3.6–11.0)

## 2013-02-05 MED ORDER — CEFAZOLIN SODIUM-DEXTROSE 2-3 GM-% IV SOLR
2.0000 g | INTRAVENOUS | Status: AC
  Administered 2013-02-06: 2 g via INTRAVENOUS
  Filled 2013-02-05 (×2): qty 50

## 2013-02-06 ENCOUNTER — Inpatient Hospital Stay (HOSPITAL_COMMUNITY)
Admission: RE | Admit: 2013-02-06 | Discharge: 2013-02-08 | DRG: 260 | Disposition: A | Discharge status: Home / Self Care | Payer: BC Managed Care – PPO | Source: Ambulatory Visit | Attending: Plastic Surgery | Admitting: Plastic Surgery

## 2013-02-06 ENCOUNTER — Encounter (HOSPITAL_COMMUNITY): Payer: Self-pay | Admitting: *Deleted

## 2013-02-06 ENCOUNTER — Inpatient Hospital Stay (HOSPITAL_COMMUNITY): Payer: BC Managed Care – PPO | Admitting: Anesthesiology

## 2013-02-06 ENCOUNTER — Encounter (HOSPITAL_COMMUNITY)
Admission: RE | Disposition: A | Discharge status: Home / Self Care | Payer: Self-pay | Source: Ambulatory Visit | Attending: Plastic Surgery

## 2013-02-06 ENCOUNTER — Encounter (HOSPITAL_COMMUNITY): Payer: Self-pay | Admitting: Anesthesiology

## 2013-02-06 DIAGNOSIS — C50919 Malignant neoplasm of unspecified site of unspecified female breast: Principal | ICD-10-CM | POA: Diagnosis present

## 2013-02-06 HISTORY — PX: BREAST RECONSTRUCTION: SHX9

## 2013-02-06 HISTORY — PX: LATISSIMUS FLAP TO BREAST: SHX5357

## 2013-02-06 LAB — GLUCOSE, CAPILLARY
Glucose-Capillary: 189 mg/dL — ABNORMAL HIGH (ref 70–99)
Glucose-Capillary: 196 mg/dL — ABNORMAL HIGH (ref 70–99)
Glucose-Capillary: 132 mg/dL — ABNORMAL HIGH (ref 70–99)

## 2013-02-06 SURGERY — RECONSTRUCTION, BREAST, USING LATISSIMUS DORSI MYOCUTANEOUS FLAP
Anesthesia: General | Site: Breast | Laterality: Right | Wound class: Clean

## 2013-02-06 MED ORDER — DIPHENHYDRAMINE HCL 50 MG/ML IJ SOLN: 12.5000 mg | Freq: Four times a day (QID) | INTRAMUSCULAR | Status: DC | PRN

## 2013-02-06 MED ORDER — GLYCOPYRROLATE 0.2 MG/ML IJ SOLN
INTRAMUSCULAR | Status: DC | PRN
  Administered 2013-02-06: 0.6 mg via INTRAVENOUS

## 2013-02-06 MED ORDER — PROPOFOL 10 MG/ML IV BOLUS
INTRAVENOUS | Status: DC | PRN
  Administered 2013-02-06: 200 mg via INTRAVENOUS

## 2013-02-06 MED ORDER — PHENYLEPHRINE HCL 10 MG/ML IJ SOLN
INTRAMUSCULAR | Status: DC | PRN
  Administered 2013-02-06 (×2): 80 ug via INTRAVENOUS

## 2013-02-06 MED ORDER — ONDANSETRON HCL 4 MG/2ML IJ SOLN
INTRAMUSCULAR | Status: DC | PRN
  Administered 2013-02-06 (×2): 4 mg via INTRAVENOUS

## 2013-02-06 MED ORDER — INSULIN ASPART 100 UNIT/ML ~~LOC~~ SOLN: 0.0000 [IU] | Freq: Every day | SUBCUTANEOUS | Status: DC

## 2013-02-06 MED ORDER — HYDROMORPHONE 0.3 MG/ML IV SOLN
INTRAVENOUS | Status: DC
  Administered 2013-02-06: 15:00:00 via INTRAVENOUS
  Administered 2013-02-06: 0.9 mg via INTRAVENOUS
  Administered 2013-02-07: 1.2 mg via INTRAVENOUS
  Administered 2013-02-07: 0.9 mg via INTRAVENOUS

## 2013-02-06 MED ORDER — METHOCARBAMOL 500 MG PO TABS: 500.0000 mg | ORAL_TABLET | Freq: Four times a day (QID) | ORAL | Status: DC | PRN

## 2013-02-06 MED ORDER — ROCURONIUM BROMIDE 100 MG/10ML IV SOLN
INTRAVENOUS | Status: DC | PRN
  Administered 2013-02-06: 20 mg via INTRAVENOUS
  Administered 2013-02-06: 10 mg via INTRAVENOUS
  Administered 2013-02-06: 50 mg via INTRAVENOUS
  Administered 2013-02-06: 20 mg via INTRAVENOUS
  Administered 2013-02-06: 30 mg via INTRAVENOUS

## 2013-02-06 MED ORDER — HYDROMORPHONE 0.3 MG/ML IV SOLN
INTRAVENOUS | Status: AC
  Filled 2013-02-06: qty 25

## 2013-02-06 MED ORDER — INSULIN ASPART 100 UNIT/ML ~~LOC~~ SOLN
0.0000 [IU] | Freq: Three times a day (TID) | SUBCUTANEOUS | Status: DC
  Administered 2013-02-07: 3 [IU] via SUBCUTANEOUS

## 2013-02-06 MED ORDER — NEOSTIGMINE METHYLSULFATE 1 MG/ML IJ SOLN
INTRAMUSCULAR | Status: DC | PRN
  Administered 2013-02-06: 3 mg via INTRAVENOUS

## 2013-02-06 MED ORDER — NALOXONE HCL 0.4 MG/ML IJ SOLN: 0.4000 mg | INTRAMUSCULAR | Status: DC | PRN

## 2013-02-06 MED ORDER — SCOPOLAMINE 1 MG/3DAYS TD PT72
MEDICATED_PATCH | TRANSDERMAL | Status: AC
  Filled 2013-02-06: qty 1

## 2013-02-06 MED ORDER — LACTATED RINGERS IV SOLN
INTRAVENOUS | Status: DC | PRN
  Administered 2013-02-06 (×3): via INTRAVENOUS

## 2013-02-06 MED ORDER — SODIUM CHLORIDE 0.9 % IR SOLN
Status: DC | PRN
  Administered 2013-02-06 (×2)

## 2013-02-06 MED ORDER — OXYCODONE HCL 5 MG/5ML PO SOLN: 5.0000 mg | Freq: Once | ORAL | Status: DC | PRN

## 2013-02-06 MED ORDER — METOCLOPRAMIDE HCL 5 MG/ML IJ SOLN
INTRAMUSCULAR | Status: DC | PRN
  Administered 2013-02-06: 10 mg via INTRAVENOUS

## 2013-02-06 MED ORDER — LETROZOLE 2.5 MG PO TABS
2.5000 mg | ORAL_TABLET | Freq: Every day | ORAL | Status: DC
  Administered 2013-02-07 – 2013-02-08 (×2): 2.5 mg via ORAL
  Filled 2013-02-06 (×3): qty 1

## 2013-02-06 MED ORDER — HEPARIN SODIUM (PORCINE) 5000 UNIT/ML IJ SOLN
5000.0000 [IU] | Freq: Once | INTRAMUSCULAR | Status: AC
  Administered 2013-02-06: 5000 [IU] via SUBCUTANEOUS
  Filled 2013-02-06: qty 1

## 2013-02-06 MED ORDER — ONDANSETRON HCL 4 MG/2ML IJ SOLN
INTRAMUSCULAR | Status: AC
  Filled 2013-02-06: qty 2

## 2013-02-06 MED ORDER — HYDROMORPHONE HCL PF 1 MG/ML IJ SOLN
0.2500 mg | INTRAMUSCULAR | Status: DC | PRN
  Administered 2013-02-06: 0.5 mg via INTRAVENOUS

## 2013-02-06 MED ORDER — OXYCODONE HCL 5 MG PO TABS: 5.0000 mg | ORAL_TABLET | Freq: Once | ORAL | Status: DC | PRN

## 2013-02-06 MED ORDER — CEFAZOLIN SODIUM 1-5 GM-% IV SOLN
1.0000 g | INTRAVENOUS | Status: AC
  Administered 2013-02-06: 1 g via INTRAVENOUS
  Filled 2013-02-06: qty 50

## 2013-02-06 MED ORDER — METOCLOPRAMIDE HCL 5 MG/ML IJ SOLN: 10.0000 mg | Freq: Once | INTRAMUSCULAR | Status: DC | PRN

## 2013-02-06 MED ORDER — ARTIFICIAL TEARS OP OINT
TOPICAL_OINTMENT | OPHTHALMIC | Status: DC | PRN
  Administered 2013-02-06: 1 via OPHTHALMIC

## 2013-02-06 MED ORDER — SODIUM CHLORIDE 0.45 % IV SOLN
INTRAVENOUS | Status: DC
  Administered 2013-02-06 – 2013-02-08 (×4): via INTRAVENOUS

## 2013-02-06 MED ORDER — DIPHENHYDRAMINE HCL 12.5 MG/5ML PO ELIX: 12.5000 mg | ORAL_SOLUTION | Freq: Four times a day (QID) | ORAL | Status: DC | PRN

## 2013-02-06 MED ORDER — LIDOCAINE HCL (CARDIAC) 20 MG/ML IV SOLN
INTRAVENOUS | Status: DC | PRN
  Administered 2013-02-06: 75 mg via INTRAVENOUS

## 2013-02-06 MED ORDER — LISINOPRIL 10 MG PO TABS
10.0000 mg | ORAL_TABLET | Freq: Every day | ORAL | Status: DC
  Administered 2013-02-07 – 2013-02-08 (×2): 10 mg via ORAL
  Filled 2013-02-06 (×3): qty 1

## 2013-02-06 MED ORDER — SODIUM CHLORIDE 0.9 % IJ SOLN: 9.0000 mL | INTRAMUSCULAR | Status: DC | PRN

## 2013-02-06 MED ORDER — LINAGLIPTIN 5 MG PO TABS
5.0000 mg | ORAL_TABLET | Freq: Every day | ORAL | Status: DC
  Administered 2013-02-07 – 2013-02-08 (×2): 5 mg via ORAL
  Filled 2013-02-06 (×3): qty 1

## 2013-02-06 MED ORDER — DOCUSATE SODIUM 100 MG PO CAPS
100.0000 mg | ORAL_CAPSULE | Freq: Every day | ORAL | Status: DC
  Administered 2013-02-07 – 2013-02-08 (×2): 100 mg via ORAL
  Filled 2013-02-06 (×3): qty 1

## 2013-02-06 MED ORDER — DEXAMETHASONE SODIUM PHOSPHATE 10 MG/ML IJ SOLN
INTRAMUSCULAR | Status: DC | PRN
  Administered 2013-02-06: 8 mg via INTRAVENOUS

## 2013-02-06 MED ORDER — PROMETHAZINE HCL 25 MG/ML IJ SOLN
6.2500 mg | INTRAMUSCULAR | Status: DC | PRN
  Administered 2013-02-06 – 2013-02-08 (×2): 6.25 mg via INTRAVENOUS
  Filled 2013-02-06 (×2): qty 1

## 2013-02-06 MED ORDER — CEFAZOLIN SODIUM 1-5 GM-% IV SOLN
1.0000 g | Freq: Three times a day (TID) | INTRAVENOUS | Status: DC
  Administered 2013-02-06 – 2013-02-08 (×5): 1 g via INTRAVENOUS
  Filled 2013-02-06 (×7): qty 50

## 2013-02-06 MED ORDER — ONDANSETRON HCL 4 MG/2ML IJ SOLN
4.0000 mg | Freq: Four times a day (QID) | INTRAMUSCULAR | Status: DC | PRN
  Administered 2013-02-06 – 2013-02-07 (×3): 4 mg via INTRAVENOUS
  Filled 2013-02-06 (×2): qty 2

## 2013-02-06 MED ORDER — HYDROMORPHONE HCL PF 1 MG/ML IJ SOLN
INTRAMUSCULAR | Status: AC
  Filled 2013-02-06: qty 1

## 2013-02-06 MED ORDER — CHLORHEXIDINE GLUCONATE 4 % EX LIQD: 1.0000 "application " | Freq: Once | CUTANEOUS | Status: DC

## 2013-02-06 MED ORDER — FENTANYL CITRATE 0.05 MG/ML IJ SOLN
INTRAMUSCULAR | Status: DC | PRN
  Administered 2013-02-06: 100 ug via INTRAVENOUS
  Administered 2013-02-06 (×2): 150 ug via INTRAVENOUS
  Administered 2013-02-06: 100 ug via INTRAVENOUS
  Administered 2013-02-06: 50 ug via INTRAVENOUS

## 2013-02-06 MED ORDER — MIDAZOLAM HCL 5 MG/5ML IJ SOLN
INTRAMUSCULAR | Status: DC | PRN
  Administered 2013-02-06: 2 mg via INTRAVENOUS

## 2013-02-06 MED ORDER — METFORMIN HCL 500 MG PO TABS
1000.0000 mg | ORAL_TABLET | Freq: Two times a day (BID) | ORAL | Status: DC
  Administered 2013-02-07 – 2013-02-08 (×3): 1000 mg via ORAL
  Filled 2013-02-06 (×5): qty 2

## 2013-02-06 MED ORDER — SODIUM CHLORIDE 0.9 % IR SOLN
Status: DC | PRN
  Administered 2013-02-06: 2000 mL

## 2013-02-06 SURGICAL SUPPLY — 84 items
ADH SKN CLS APL DERMABOND .7 (GAUZE/BANDAGES/DRESSINGS) ×2
APPLIER CLIP 9.375 MED OPEN (MISCELLANEOUS) ×6
APR CLP MED 9.3 20 MLT OPN (MISCELLANEOUS) ×4
ATCH SMKEVC FLXB CAUT HNDSWH (FILTER) ×4 IMPLANT
BAG DECANTER FOR FLEXI CONT (MISCELLANEOUS) ×3 IMPLANT
BANDAGE ELASTIC 6 VELCRO ST LF (GAUZE/BANDAGES/DRESSINGS) IMPLANT
BINDER BREAST XLRG (GAUZE/BANDAGES/DRESSINGS) ×1 IMPLANT
BIOPATCH RED 1 DISK 7.0 (GAUZE/BANDAGES/DRESSINGS) ×5 IMPLANT
BLADE SURG 15 STRL LF DISP TIS (BLADE) ×2 IMPLANT
BLADE SURG 15 STRL SS (BLADE) ×3
CANISTER SUCTION 2500CC (MISCELLANEOUS) ×3 IMPLANT
CHLORAPREP W/TINT 26ML (MISCELLANEOUS) ×5 IMPLANT
CLIP APPLIE 9.375 MED OPEN (MISCELLANEOUS) ×2 IMPLANT
CLOTH BEACON ORANGE TIMEOUT ST (SAFETY) ×3 IMPLANT
COVER SURGICAL LIGHT HANDLE (MISCELLANEOUS) ×3 IMPLANT
COVER TABLE BACK 60X90 (DRAPES) ×1 IMPLANT
DERMABOND ADVANCED (GAUZE/BANDAGES/DRESSINGS) ×1
DERMABOND ADVANCED .7 DNX12 (GAUZE/BANDAGES/DRESSINGS) IMPLANT
DRAIN CHANNEL 19F RND (DRAIN) ×8 IMPLANT
DRAPE INCISE 23X17 IOBAN STRL (DRAPES) ×1
DRAPE INCISE 23X17 STRL (DRAPES) ×2 IMPLANT
DRAPE INCISE IOBAN 23X17 STRL (DRAPES) ×2 IMPLANT
DRAPE LAPAROSCOPIC ABDOMINAL (DRAPES) ×3 IMPLANT
DRAPE ORTHO SPLIT 77X108 STRL (DRAPES) ×18
DRAPE PROXIMA HALF (DRAPES) ×6 IMPLANT
DRAPE SURG 17X23 STRL (DRAPES) ×13 IMPLANT
DRAPE SURG ORHT 6 SPLT 77X108 (DRAPES) ×8 IMPLANT
DRAPE WARM FLUID 44X44 (DRAPE) ×3 IMPLANT
DRSG PAD ABDOMINAL 8X10 ST (GAUZE/BANDAGES/DRESSINGS) ×5 IMPLANT
DRSG TEGADERM 2-3/8X2-3/4 SM (GAUZE/BANDAGES/DRESSINGS) ×1 IMPLANT
DRSG TEGADERM 4X4.75 (GAUZE/BANDAGES/DRESSINGS) ×1 IMPLANT
ELECT BLADE 6.5 EXT (BLADE) IMPLANT
ELECT CAUTERY BLADE 6.4 (BLADE) ×6 IMPLANT
ELECT REM PT RETURN 9FT ADLT (ELECTROSURGICAL) ×3
ELECTRODE REM PT RTRN 9FT ADLT (ELECTROSURGICAL) ×2 IMPLANT
EVACUATOR SILICONE 100CC (DRAIN) ×8 IMPLANT
EVACUATOR SMOKE ACCUVAC VALLEY (FILTER) ×2
GAUZE XEROFORM 5X9 LF (GAUZE/BANDAGES/DRESSINGS) IMPLANT
GLOVE BIO SURGEON STRL SZ7.5 (GLOVE) ×12 IMPLANT
GLOVE BIOGEL PI IND STRL 7.5 (GLOVE) IMPLANT
GLOVE BIOGEL PI IND STRL 8 (GLOVE) ×4 IMPLANT
GLOVE BIOGEL PI INDICATOR 7.5 (GLOVE) ×9
GLOVE BIOGEL PI INDICATOR 8 (GLOVE) ×3
GLOVE ECLIPSE 7.0 STRL STRAW (GLOVE) ×3 IMPLANT
GLOVE ECLIPSE 7.5 STRL STRAW (GLOVE) ×3 IMPLANT
GLOVE SURG SS PI 7.0 STRL IVOR (GLOVE) ×1 IMPLANT
GOWN STRL NON-REIN LRG LVL3 (GOWN DISPOSABLE) ×9 IMPLANT
GOWN STRL REIN XL XLG (GOWN DISPOSABLE) ×7 IMPLANT
IMPL SILICONE 600CC (Breast) IMPLANT
IMPLANT BREAST SM RND GEL 350C (Breast) ×1 IMPLANT
IMPLANT SILICONE 600CC (Breast) ×3 IMPLANT
KIT BASIN OR (CUSTOM PROCEDURE TRAY) ×3 IMPLANT
KIT ROOM TURNOVER OR (KITS) ×3 IMPLANT
MARKER SKIN DUAL TIP RULER LAB (MISCELLANEOUS) ×3 IMPLANT
NS IRRIG 1000ML POUR BTL (IV SOLUTION) ×6 IMPLANT
PACK GENERAL/GYN (CUSTOM PROCEDURE TRAY) ×3 IMPLANT
PAD ARMBOARD 7.5X6 YLW CONV (MISCELLANEOUS) ×6 IMPLANT
PENCIL BUTTON HOLSTER BLD 10FT (ELECTRODE) ×3 IMPLANT
PREFILTER EVAC NS 1 1/3-3/8IN (MISCELLANEOUS) ×3 IMPLANT
SLEEVE SURGEON STRL (DRAPES) ×2 IMPLANT
SPONGE GAUZE 4X4 12PLY (GAUZE/BANDAGES/DRESSINGS) ×3 IMPLANT
SPONGE LAP 18X18 X RAY DECT (DISPOSABLE) ×3 IMPLANT
STAPLER VISISTAT 35W (STAPLE) ×3 IMPLANT
STRIP CLOSURE SKIN 1/2X4 (GAUZE/BANDAGES/DRESSINGS) ×3 IMPLANT
SUT MNCRL AB 3-0 PS2 18 (SUTURE) ×22 IMPLANT
SUT MON AB 2-0 CT1 36 (SUTURE) ×4 IMPLANT
SUT PDS AB 0 CT 36 (SUTURE) ×6 IMPLANT
SUT PROLENE 2 0 CT 1 (SUTURE) ×1 IMPLANT
SUT PROLENE 3 0 PS 1 (SUTURE) ×10 IMPLANT
SUT PROLENE 3 0 PS 2 (SUTURE) ×6 IMPLANT
SUT VIC AB 3-0 FS2 27 (SUTURE) IMPLANT
SUT VIC AB 3-0 SH 18 (SUTURE) ×5 IMPLANT
SUT VIC AB 3-0 SH 27 (SUTURE) ×3
SUT VIC AB 3-0 SH 27X BRD (SUTURE) IMPLANT
SUT VIC AB 3-0 SH 8-18 (SUTURE) ×7 IMPLANT
SUT VICRYL AB 3 0 TIES (SUTURE) ×1 IMPLANT
SYR 50ML SLIP (SYRINGE) IMPLANT
SYR BULB IRRIGATION 50ML (SYRINGE) ×6 IMPLANT
TAPE CLOTH SURG 4X10 WHT LF (GAUZE/BANDAGES/DRESSINGS) ×1 IMPLANT
TOWEL OR 17X24 6PK STRL BLUE (TOWEL DISPOSABLE) ×3 IMPLANT
TOWEL OR 17X26 10 PK STRL BLUE (TOWEL DISPOSABLE) ×3 IMPLANT
TRAY FOLEY CATH 14FRSI W/METER (CATHETERS) ×3 IMPLANT
TUBE CONNECTING 12X1/4 (SUCTIONS) ×6 IMPLANT
WATER STERILE IRR 1000ML POUR (IV SOLUTION) IMPLANT

## 2013-02-06 NOTE — Anesthesia Preprocedure Evaluation (Signed)
Anesthesia Evaluation  Patient identified by MRN, date of birth, ID band Patient awake    Reviewed: Allergy & Precautions, H&P , NPO status , Patient's Chart, lab work & pertinent test results, reviewed documented beta blocker date and time   History of Anesthesia Complications (+) PONV  Airway Mallampati: I TM Distance: >3 FB Neck ROM: full    Dental   Pulmonary neg pulmonary ROS,  breath sounds clear to auscultation        Cardiovascular hypertension, On Medications Rhythm:regular     Neuro/Psych negative neurological ROS  negative psych ROS   GI/Hepatic negative GI ROS, Neg liver ROS,   Endo/Other  diabetes, Oral Hypoglycemic Agents  Renal/GU negative Renal ROS  negative genitourinary   Musculoskeletal   Abdominal   Peds  Hematology negative hematology ROS (+)   Anesthesia Other Findings See surgeon's H&P   Reproductive/Obstetrics negative OB ROS                           Anesthesia Physical Anesthesia Plan  ASA: III  Anesthesia Plan: General   Post-op Pain Management:    Induction: Intravenous  Airway Management Planned: Oral ETT  Additional Equipment:   Intra-op Plan:   Post-operative Plan: Extubation in OR  Informed Consent: I have reviewed the patients History and Physical, chart, labs and discussed the procedure including the risks, benefits and alternatives for the proposed anesthesia with the patient or authorized representative who has indicated his/her understanding and acceptance.   Dental Advisory Given  Plan Discussed with: CRNA and Surgeon  Anesthesia Plan Comments:         Anesthesia Quick Evaluation

## 2013-02-06 NOTE — Anesthesia Postprocedure Evaluation (Signed)
  Anesthesia Post-op Note  Patient: Kimberly [name]  Procedure(s) Performed: Procedure(s): LATISSIMUS FLAP TO RIGHT BREAST WITH IMPLANT (Right) LEFT BREAST EXPANDER REMOVAL AND DELAYED BREAST RECONSTRUCTION WITH IMPLANT (Left)  Patient Location: PACU  Anesthesia Type:General  Level of Consciousness: awake  Airway and Oxygen Therapy: Patient Spontanous Breathing and Patient connected to nasal cannula oxygen  Post-op Pain: mild  Post-op Assessment: Post-op Vital signs reviewed, Patient's Cardiovascular Status Stable, Respiratory Function Stable, Patent Airway and No signs of Nausea or vomiting  Post-op Vital Signs: Reviewed and stable  Complications: No apparent anesthesia complications

## 2013-02-06 NOTE — Transfer of Care (Signed)
Immediate Anesthesia Transfer of Care Note  Patient: Kimberly [name]  Procedure(s) Performed: Procedure(s): LATISSIMUS FLAP TO RIGHT BREAST WITH IMPLANT (Right) LEFT BREAST EXPANDER REMOVAL AND DELAYED BREAST RECONSTRUCTION WITH IMPLANT (Left)  Patient Location: PACU  Anesthesia Type:General  Level of Consciousness: awake, alert  and oriented  Airway & Oxygen Therapy: Patient Spontanous Breathing and Patient connected to nasal cannula oxygen  Post-op Assessment: Report given to PACU RN and Post -op Vital signs reviewed and stable  Post vital signs: Reviewed and stable  Complications: No apparent anesthesia complications

## 2013-02-06 NOTE — Brief Op Note (Signed)
02/06/2013  2:28 PM  PATIENT:  Kimberly [name]  57 y.o. female  PRE-OPERATIVE DIAGNOSIS:  BREAST CANCER  POST-OPERATIVE DIAGNOSIS:  BREAST CANCER  PROCEDURE:  Procedure(s): LATISSIMUS FLAP TO RIGHT BREAST WITH IMPLANT (Right) LEFT BREAST EXPANDER REMOVAL AND DELAYED BREAST RECONSTRUCTION WITH IMPLANT (Left)  SURGEON:  Surgeon(s) and Role:    * Etter Sjogren, MD - Primary  PHYSICIAN ASSISTANT:   ASSISTANTS: Jasmine December, RNFA   ANESTHESIA:   general  EBL:  Total I/O In: 2000 [I.V.:2000] Out: 440 [Urine:440]  BLOOD ADMINISTERED:none  DRAINS: (3) Jackson-Pratt drain(s) with closed bulb suction in the right back donor site (2) and right chest (1)   LOCAL MEDICATIONS USED:  NONE  SPECIMEN:  Source of Specimen:  right mastectomy scar  DISPOSITION OF SPECIMEN:  PATHOLOGY  COUNTS:  YES  TOURNIQUET:  * No tourniquets in log *  DICTATION: .Other Dictation: Dictation Number [id number]  PLAN OF CARE: Admit to inpatient   PATIENT DISPOSITION:  PACU - hemodynamically stable.   Delay start of Pharmacological VTE agent (>24hrs) due to surgical blood loss or risk of bleeding: no

## 2013-02-06 NOTE — Anesthesia Procedure Notes (Signed)
Procedure Name: Intubation Date/Time: 02/06/2013 8:32 AM Performed by: Gwenyth Allegra Pre-anesthesia Checklist: Timeout performed, Patient identified, Emergency Drugs available, Suction available and Patient being monitored Patient Re-evaluated:Patient Re-evaluated prior to inductionOxygen Delivery Method: Circle system utilized Preoxygenation: Pre-oxygenation with 100% oxygen Intubation Type: IV induction Ventilation: Mask ventilation without difficulty and Oral airway inserted - appropriate to patient size Laryngoscope Size: Mac and 3 Tube size: 7.0 mm Number of attempts: 1 Airway Equipment and Method: Stylet Placement Confirmation: ETT inserted through vocal cords under direct vision,  breath sounds checked- equal and bilateral and positive ETCO2 Secured at: 21 cm Tube secured with: Tape Dental Injury: Teeth and Oropharynx as per pre-operative assessment

## 2013-02-06 NOTE — Preoperative (Signed)
Beta Blockers   Reason not to administer Beta Blockers:Not Applicable 

## 2013-02-06 NOTE — OR Nursing (Signed)
Patient was repositioned a third time to supine bilateral arms on arm boards pillow under bilateral knees foam under bilateral heels and safety strap across bilateral thighs.

## 2013-02-06 NOTE — H&P (Signed)
I have re-evaluated and re-examined the patient and there are no changes. See office notes in paper chart dated 01/29/2013.

## 2013-02-07 LAB — GLUCOSE, CAPILLARY
Glucose-Capillary: 117 mg/dL — ABNORMAL HIGH (ref 70–99)
Glucose-Capillary: 151 mg/dL — ABNORMAL HIGH (ref 70–99)

## 2013-02-07 LAB — HEMOGLOBIN A1C
Hgb A1c MFr Bld: 6.7 % — ABNORMAL HIGH (ref <5.7)
Mean Plasma Glucose: 146 mg/dL — ABNORMAL HIGH (ref <117)

## 2013-02-07 MED ORDER — HEPARIN SODIUM (PORCINE) 5000 UNIT/ML IJ SOLN
5000.0000 [IU] | Freq: Three times a day (TID) | INTRAMUSCULAR | Status: DC
  Administered 2013-02-07 – 2013-02-08 (×4): 5000 [IU] via SUBCUTANEOUS
  Filled 2013-02-07 (×7): qty 1

## 2013-02-07 MED ORDER — METOCLOPRAMIDE HCL 10 MG PO TABS
10.0000 mg | ORAL_TABLET | Freq: Three times a day (TID) | ORAL | Status: DC
  Administered 2013-02-07 – 2013-02-08 (×5): 10 mg via ORAL
  Filled 2013-02-07 (×7): qty 1

## 2013-02-07 MED ORDER — HYDROMORPHONE HCL 2 MG PO TABS
2.0000 mg | ORAL_TABLET | ORAL | Status: DC | PRN
  Administered 2013-02-07 (×2): 4 mg via ORAL
  Administered 2013-02-07: 2 mg via ORAL
  Administered 2013-02-08: 4 mg via ORAL
  Administered 2013-02-08: 2 mg via ORAL
  Filled 2013-02-07 (×2): qty 1
  Filled 2013-02-07 (×3): qty 2

## 2013-02-07 NOTE — Progress Notes (Signed)
Inpatient Diabetes Program Recommendations  AACE/ADA: New Consensus Statement on Inpatient Glycemic Control (2013)  Target Ranges:  Prepandial:   less than 140 mg/dL      Peak postprandial:   less than 180 mg/dL (1-2 hours)      Critically ill patients:  140 - 180 mg/dL   Pt on oral meds while in the hospital not recommended.  Inpatient Diabetes Program Recommendations Insulin - Basal: Please add 20 units lantus at HS or daily.. Pt takes 38-40 units at home.  This is basal insulin onlly. Correction (SSI): Pllease decrease correction to moderate tidwc and add the HS scale. Oral Agents: Pt is 1 day post-op. If pt cannot tolerate full diet, would not use the oral agents, Metformin nor tradjenta.  Typically using oral agents while in the hospital  are not used due to variable to no po intake.  Thank you, Lenor Coffin, RN, CNS, Diabetes Coordinator [phone])

## 2013-02-07 NOTE — Op Note (Signed)
NAMEMarland Kitchen  Kimberly [name] NO.:  [id number]  MEDICAL RECORD NO.:  [id number]  LOCATION:  6N01C                        FACILITY:  [facility]  PHYSICIAN:  Etter Sjogren, M.D.     DATE OF BIRTH:  [birth year]  DATE OF PROCEDURE:  02/06/2013 DATE OF DISCHARGE:                              OPERATIVE REPORT   PREOPERATIVE DIAGNOSIS:  Breast cancer.  POSTOPERATIVE DIAGNOSIS:  Breast cancer.  PROCEDURE PERFORMED: 1. Right latissimus myocutaneous flap. 2. Delayed breast reconstruction, right side with silicone gel     implants 3. Removal of tissue expander and delayed breast reconstruction with     silicone gel implant, left side.  SURGEON:  Etter Sjogren, M.D.  ASSISTANT:  Humphrey Rolls RNFA.  CLINICAL NOTE:  This 57 year old woman has had breast cancer.  She has had bilateral mastectomy.  She had a very advanced cancer on the right side, and subsequently underwent a radiation.  At the time of bilateral mastectomy, a tissue expander was placed on the left.  She was in agreement with proceeding with different types of breast reconstruction on the 2 sides.  She understood that the radiation on the right side, would make it very difficult to use the tissue expander and that she would have a latissimus flap reconstruction after the radiation.  She completed radiation 6 months ago on the right side, and she now presents for her breast reconstruction on the left side, removal of her tissue expander and placement of the implant.  She selected 600 mL implant for the left side and preferred silicone gel.  On the right side, it was planned to use a 300 mL implant with the idea that the latissimus flap will contribute approximately 300 g of tissue.  The nature of the procedure, risks plus complications were discussed with her in detail. These risks included, but not limited to, bleeding, infection, healing problems, scarring, loss of sensation, fluid accumulations, anesthesia related  complications, pneumothorax, pulmonary embolism, deep vein thrombosis, failure of device, capsular contracture, displacement device, wrinkles, ripples, and added risk of healing problems due to her previous radiation on the right side, chronic pain, and overall disappointment and asymmetry as well as loss of range of motion and strength in the right arm and shoulder.  She understood all this and wished to proceed.  DESCRIPTION OF PROCEDURE:  The patient was marked in the holding area, the latissimus flap as well as the breast reconstruction in the right side and left side.  She was in the operating room placed supine.  After successful induction of general anesthesia, she was prepped with ChloraPrep and after waiting full 3 minutes for drying, she was draped with sterile drapes.  The old mastectomy scar on the right side was excised and sent as a specimen.  The superior inferior mastectomy flaps were elevated and the defect was recreated.  Irrigation with saline as well as antibiotic solution and meticulous hemostasis with electrocautery.  An antibiotic soaked lap was then placed in this space, and it was stapled, closed, and covered with a sterile large Steri- Drape. The patient was then turned into a left lateral decubitus position and was positioned using the bean bag, as well as axillary roll.  She was again prepped with ChloraPrep and draped with sterile drapes after waiting full 3 minutes for drying.  The skin paddle was then incised and the dissection carried down through the subcutaneous tissue beveling superior and inferior in order to leave a broader attachment of subcutaneous tissue at the level of the muscle, then was present at the level of skin.  The dissection was then continued on top of the muscle in all directions and the medial inferior lateral and superior borders of the muscle were identified.  The muscle was then freed and was reflected slowly and carefully  cephalad.  A large perforating vessels were triple ligated with Ligaclips and divided or occasionally ligated with 3-0 Vicryl suture ligatures.  The dissection continued cephalad until it was felt that the flap would then rotate nicely to the right chest.  The subcutaneous tunnel was created to the chest.  The lap was then removed through this tunnel that had been placed in the right chest and the flap was then passed through the tunnel and to the right chest. The flap had excellent color and bright red bleeding as periphery consistent viability.  Thorough irrigation with saline and meticulous dissection with electrocautery.  Two 19-French drains were positioned, brought through separate stab wounds infero-anteriorly and secured with 3-0 Prolene sutures.  The closure with 0 PDS interrupted inverted deep sutures, 2-0 Monocryl interrupted inverted deep dermal sutures, and running 3-0 Monocryl subcuticular suture, and a few interrupted 3-0 Prolene simple sutures at the central aspect of the wound for additional support.  Dermabond, dry sterile dressing applied.  The drains were dressed with Bio patches and medium Tegaderm.  She was then placed supine.  The large Steri-Drape was then removed from the right chest once the patient was then positioned.  Great care was taken to avoid any damage to the wound.  She was prepped with ChloraPrep for the left side of the chest and breast, and on the right side prepped with Betadine.  Again, the draping with clear Steri-drapes followed by sterile towels and sterile drapes.  The left side was approached first using lateral aspect of the old mastectomy scar, and the dissection carried into the subcutaneous tissue, and then beveling inferiorly for short distance in order to leave the incision through the muscle inferior to the skin incision.  The muscle was then opened in the underlying expander, was then deflated and gently removed.  The lateral aspect  of the submuscular space was a little bit further lateral than desired.  Rest of space looked very good and healthy.  Portion of the capsule was excised laterally and the lateral capsulorrhaphy was performed using a 2-0 Prolene in a simple running suture and 3-0 Vicryl interrupted horizontal mattress sutures in order to bury the Prolenes.  After thoroughly cleaning gloves, the implant was soaked in antibiotic solution and then was positioned in the chest after placing antibiotic solution in the chest allowing to dwell there.  The closure with 3-0 Vicryl simple interrupted sutures for the muscle and capsule with great care taken to avoid damage to the underlying implant, which was kept under direct vision all times.  The skin was closed with 3-0 Monocryl interrupted inverted deep dermal sutures and a running 3-0 Monocryl subcuticular suture.  Attention was then directed back to the right chest where the staples that had temporary closed the wound were removed and the underlying space had been inspected including the flap.  The flap was noted to be viable with excellent  color and bright red bleeding at its periphery consistent viability.  The space was irrigated with some saline. Hemostasis electrocautery.  The muscle was inset inferiorly and laterally using 3-0 Vicryl sutures, and then after thoroughly cleaning gloves, the 300 mL silicone gel implant was then positioned and the remainder of the muscle insetting with 3-0 Vicryl interrupted sutures. Again, great care taken to avoid damage to the underlying implant, which was kept under direct vision at all times.  The skin paddle was then inset with 3-0 Monocryl inverted deep dermal sutures, and occasional 3-0 Prolene simple interrupted suture at the medial and lateral aspects due to the radiated skin in order to provide some additional support there. Dermabond applied to all the wounds and again dressed with Biopatch and sterile Tegaderm  and she was transferred to the recovery room in stable having tolerated the procedure well.  DISPOSITION:  She will be admitted to the hospital.     Etter Sjogren, M.D.     DB/MEDQ  D:  02/06/2013  T:  02/07/2013  Job:  [id number]

## 2013-02-07 NOTE — Progress Notes (Signed)
Dilaudid PCA pumped discontinued. History reviewed; Total drug: 1.39, total demands 7; total deliveries 7. PCA turned off at 0810. Encouraged patient to call out for oral pain meds if needed. Will continue to monitor. Zenovia Jordan, RN

## 2013-02-07 NOTE — Progress Notes (Signed)
Subjective: Good pain control. Some nausea.  Objective: Vital signs in last 24 hours: Temp:  [97.6 F (36.4 C)-99.4 F (37.4 C)] 98.9 F (37.2 C) (06/11 0450) Pulse Rate:  [85-96] 94 (06/11 0450) Resp:  [13-20] 17 (06/11 0450) BP: (107-142)/(62-81) 110/62 mmHg (06/11 0450) SpO2:  [94 %-98 %] 97 % (06/11 0450) Weight:  [191 lb 9.3 oz (86.9 kg)] 191 lb 9.3 oz (86.9 kg) (06/11 0500)  Intake/Output from previous day: 06/10 0701 - 06/11 0700 In: 2900 [I.V.:2900] Out: 4290 [Urine:4040; Drains:220; Blood:30] Intake/Output this shift:    Operative sites: Mastectomy flaps viable. Flap viable. Good capillary refill (1-2 sec) throughout). Drains functioning. Drainage thin.  No results found for this basename: WBC, HGB, HCT, PLATELETS, NA, K, CL, CO2, BUN, CREATININE, GLU,  in the last 72 hours  Studies/Results: No results found.  Assessment/Plan: D/C foley. PO pain med. DC PCA. Ambulate. Encourage spirometry. Continue IV fluids for now. Add Reglan for anti-nausea regimen.   LOS: 1 day    Etter Sjogren M 02/07/2013 7:37 AM

## 2013-02-08 LAB — GLUCOSE, CAPILLARY

## 2013-02-08 MED ORDER — METOCLOPRAMIDE HCL 10 MG PO TABS: 10.0000 mg | ORAL_TABLET | Freq: Three times a day (TID) | ORAL | Status: DC

## 2013-02-08 MED ORDER — HYDROMORPHONE HCL 2 MG PO TABS: 2.0000 mg | ORAL_TABLET | ORAL | Status: DC | PRN

## 2013-02-08 MED ORDER — CEPHALEXIN 500 MG PO CAPS
500.0000 mg | ORAL_CAPSULE | Freq: Two times a day (BID) | ORAL | Status: DC
  Administered 2013-02-08: 500 mg via ORAL
  Filled 2013-02-08: qty 1

## 2013-02-08 MED ORDER — ENOXAPARIN SODIUM 40 MG/0.4ML ~~LOC~~ SOLN: 40.0000 mg | SUBCUTANEOUS | Status: DC

## 2013-02-08 MED ORDER — PROMETHAZINE HCL 25 MG PO TABS: 25.0000 mg | ORAL_TABLET | Freq: Three times a day (TID) | ORAL | Status: DC | PRN

## 2013-02-08 MED ORDER — DSS 100 MG PO CAPS: 100.0000 mg | ORAL_CAPSULE | Freq: Every day | ORAL | Status: DC

## 2013-02-08 MED ORDER — ENOXAPARIN (LOVENOX) PATIENT EDUCATION KIT
PACK | Freq: Once | Status: DC
  Filled 2013-02-08: qty 1

## 2013-02-08 MED ORDER — ENOXAPARIN SODIUM 40 MG/0.4ML ~~LOC~~ SOLN
40.0000 mg | SUBCUTANEOUS | Status: DC
  Administered 2013-02-08: 40 mg via SUBCUTANEOUS
  Filled 2013-02-08: qty 0.4

## 2013-02-08 MED ORDER — METHOCARBAMOL 500 MG PO TABS: 500.0000 mg | ORAL_TABLET | Freq: Four times a day (QID) | ORAL | Status: DC | PRN

## 2013-02-08 MED ORDER — CEPHALEXIN 500 MG PO CAPS: 500.0000 mg | ORAL_CAPSULE | Freq: Two times a day (BID) | ORAL | Status: DC

## 2013-02-08 NOTE — Discharge Summary (Signed)
Physician Discharge Summary  Patient ID: Kimberly [name] MRN: [id number] DOB/AGE: November 22, 1955 57 y.o.  Admit date: 02/06/2013 Discharge date: 02/08/2013  Admission Diagnoses:Breast cancer  Discharge Diagnoses: Same Active Problems:   * No active hospital problems. *   Discharged Condition: good  Hospital Course: On the day of admission the patient was taken to surgery and had bilateral breast reconstruction with left side placement of implant and right side latissimus flap with implnt. The patient tolerated the procedures well. Postoperatively, the flap maintained excellent color and capillary refill. The patient was ambulatory and tolerating diet on the first postoperative day. Nausea resolved. Tolerating diet. DVT prophylaxis was given pre-op and post-op. She would like to go home.  Treatments: antibiotics: Ancef, anticoagulation: heparin and surgery: right latissimus with implant breast reconstruction and left side reconstruction with implant  Discharge Exam: Blood pressure 100/58, pulse 103, temperature 99.4 F (37.4 C), temperature source Oral, resp. rate 20, height 5\' 2"  (1.575 m), weight 191 lb 9.3 oz (86.9 kg), SpO2 92.00%.  Operative sites: Mastectomy flaps viable. Flap viable with capillary refill 1-2 seconds throughout the skin paddle. Drains functioning. Drainage thin. There is no evidence of bleeding or infection at any operative site.  Disposition: 01-Home or Self Care     Medication List    TAKE these medications       CALCIUM 600 + D PO  Take 1 tablet by mouth 2 (two) times daily.     cephALEXin 500 MG capsule  Commonly known as:  KEFLEX  Take 1 capsule (500 mg total) by mouth every 12 (twelve) hours.     DSS 100 MG Caps  Take 100 mg by mouth daily.     enoxaparin 40 MG/0.4ML injection  Commonly known as:  LOVENOX  Inject 0.4 mLs (40 mg total) into the skin daily.     Ferrous Fumarate 324 MG Tabs  Take 1 tablet by mouth daily.     HYDROmorphone 2 MG  tablet  Commonly known as:  DILAUDID  Take 1-2 tablets (2-4 mg total) by mouth every 4 (four) hours as needed for pain.     insulin glargine 100 UNIT/ML injection  Commonly known as:  LANTUS  Inject 38-40 Units into the skin at bedtime.     letrozole 2.5 MG tablet  Commonly known as:  FEMARA  Take 2.5 mg by mouth daily.     lisinopril 10 MG tablet  Commonly known as:  PRINIVIL,ZESTRIL  Take 10 mg by mouth daily.     metFORMIN 1000 MG tablet  Commonly known as:  GLUCOPHAGE  Take 1,000 mg by mouth 2 (two) times daily with a meal.     methocarbamol 500 MG tablet  Commonly known as:  ROBAXIN  Take 1 tablet (500 mg total) by mouth every 6 (six) hours as needed.     metoCLOPramide 10 MG tablet  Commonly known as:  REGLAN  Take 1 tablet (10 mg total) by mouth 3 (three) times daily before meals.     ONGLYZA 5 MG Tabs tablet  Generic drug:  saxagliptin HCl  Take 5 mg by mouth daily.     promethazine 25 MG tablet  Commonly known as:  PHENERGAN  Take 1 tablet (25 mg total) by mouth every 8 (eight) hours as needed.         SignedOdis Luster, Mauricio Dahlen M 02/08/2013, 11:49 AM

## 2013-02-08 NOTE — Discharge Planning (Signed)
Copy of home instructions and rx to pt who verbalizes understanding.  Lovenox teaching done with pt and husband who administered today's dose without difficulty.  Able to demonstrate JP care.  D'cd per w/c to private car home acomp. By husband

## 2013-02-13 ENCOUNTER — Encounter (HOSPITAL_COMMUNITY): Payer: Self-pay | Admitting: Plastic Surgery

## 2013-02-23 LAB — CBC CANCER CENTER
Basophil #: 0.1 x10 3/mm (ref 0.0–0.1)
Eosinophil %: 5.4 %
HGB: 11.1 g/dL — ABNORMAL LOW (ref 12.0–16.0)
Lymphocyte #: 1.7 x10 3/mm (ref 1.0–3.6)
MCH: 28.7 pg (ref 26.0–34.0)
MCHC: 34.5 g/dL (ref 32.0–36.0)
MCV: 83 fL (ref 80–100)
Monocyte %: 7 %
Neutrophil #: 4.2 x10 3/mm (ref 1.4–6.5)
WBC: 6.9 x10 3/mm (ref 3.6–11.0)

## 2013-02-27 ENCOUNTER — Ambulatory Visit: Payer: Self-pay | Admitting: Oncology

## 2013-03-16 LAB — COMPREHENSIVE METABOLIC PANEL
Albumin: 3.7 g/dL (ref 3.4–5.0)
Alkaline Phosphatase: 79 U/L (ref 50–136)
Anion Gap: 10 (ref 7–16)
Bilirubin,Total: 0.3 mg/dL (ref 0.2–1.0)
Co2: 28 mmol/L (ref 21–32)
Creatinine: 1.11 mg/dL (ref 0.60–1.30)
EGFR (Non-African Amer.): 55 — ABNORMAL LOW
Glucose: 198 mg/dL — ABNORMAL HIGH (ref 65–99)
Osmolality: 284 (ref 275–301)
Potassium: 4.2 mmol/L (ref 3.5–5.1)
Total Protein: 7.3 g/dL (ref 6.4–8.2)

## 2013-03-16 LAB — CBC CANCER CENTER
Basophil #: 0 x10 3/mm (ref 0.0–0.1)
Eosinophil #: 0.2 x10 3/mm (ref 0.0–0.7)
Eosinophil %: 4.7 %
HCT: 32.6 % — ABNORMAL LOW (ref 35.0–47.0)
Lymphocyte #: 1.3 x10 3/mm (ref 1.0–3.6)
MCH: 28.6 pg (ref 26.0–34.0)
MCHC: 33.9 g/dL (ref 32.0–36.0)
MCV: 84 fL (ref 80–100)
Monocyte #: 0.3 x10 3/mm (ref 0.2–0.9)
Monocyte %: 7.2 %
Neutrophil #: 2.7 x10 3/mm (ref 1.4–6.5)
Neutrophil %: 58.5 %
RBC: 3.87 10*6/uL (ref 3.80–5.20)
RDW: 15 % — ABNORMAL HIGH (ref 11.5–14.5)
WBC: 4.6 x10 3/mm (ref 3.6–11.0)

## 2013-03-30 ENCOUNTER — Ambulatory Visit: Payer: Self-pay | Admitting: Oncology

## 2013-04-06 LAB — CBC CANCER CENTER
Basophil %: 1.4 %
Eosinophil %: 2 %
HCT: 31.9 % — ABNORMAL LOW (ref 35.0–47.0)
Lymphocyte #: 1.4 x10 3/mm (ref 1.0–3.6)
Lymphocyte %: 29.7 %
MCH: 28.7 pg (ref 26.0–34.0)
Monocyte #: 0.4 x10 3/mm (ref 0.2–0.9)
Monocyte %: 8.2 %
Neutrophil #: 2.8 x10 3/mm (ref 1.4–6.5)
Neutrophil %: 58.7 %
Platelet: 342 x10 3/mm (ref 150–440)
RBC: 3.79 10*6/uL — ABNORMAL LOW (ref 3.80–5.20)

## 2013-04-27 LAB — CBC CANCER CENTER
Basophil #: 0.1 x10 3/mm (ref 0.0–0.1)
Basophil %: 1.1 %
HCT: 34.3 % — ABNORMAL LOW (ref 35.0–47.0)
HGB: 11.5 g/dL — ABNORMAL LOW (ref 12.0–16.0)
Lymphocyte %: 26.8 %
MCH: 27.9 pg (ref 26.0–34.0)
MCV: 84 fL (ref 80–100)
Monocyte #: 0.5 x10 3/mm (ref 0.2–0.9)
Neutrophil #: 3.5 x10 3/mm (ref 1.4–6.5)
Neutrophil %: 61.3 %
RBC: 4.1 10*6/uL (ref 3.80–5.20)
RDW: 15.2 % — ABNORMAL HIGH (ref 11.5–14.5)
WBC: 5.6 x10 3/mm (ref 3.6–11.0)

## 2013-04-27 LAB — COMPREHENSIVE METABOLIC PANEL
Albumin: 3.8 g/dL (ref 3.4–5.0)
BUN: 19 mg/dL — ABNORMAL HIGH (ref 7–18)
Bilirubin,Total: 0.3 mg/dL (ref 0.2–1.0)
Chloride: 103 mmol/L (ref 98–107)
Creatinine: 1.08 mg/dL (ref 0.60–1.30)
EGFR (African American): >60
EGFR (Non-African Amer.): 57 — ABNORMAL LOW
Glucose: 148 mg/dL — ABNORMAL HIGH (ref 65–99)
SGOT(AST): 23 U/L (ref 15–37)
Sodium: 139 mmol/L (ref 136–145)
Total Protein: 7.4 g/dL (ref 6.4–8.2)

## 2013-04-30 ENCOUNTER — Ambulatory Visit: Payer: Self-pay | Admitting: Oncology

## 2013-06-08 ENCOUNTER — Ambulatory Visit: Payer: Self-pay | Admitting: Oncology

## 2013-06-30 ENCOUNTER — Ambulatory Visit: Payer: Self-pay | Admitting: Oncology

## 2013-07-20 LAB — COMPREHENSIVE METABOLIC PANEL
Albumin: 3.7 g/dL (ref 3.4–5.0)
Alkaline Phosphatase: 68 U/L (ref 50–136)
Anion Gap: 9 (ref 7–16)
BUN: 14 mg/dL (ref 7–18)
Bilirubin,Total: 0.4 mg/dL (ref 0.2–1.0)
Calcium, Total: 9.8 mg/dL (ref 8.5–10.1)
Chloride: 102 mmol/L (ref 98–107)
Co2: 28 mmol/L (ref 21–32)
Creatinine: 0.91 mg/dL (ref 0.60–1.30)
EGFR (Non-African Amer.): >60
Osmolality: 278 (ref 275–301)
SGPT (ALT): 34 U/L (ref 12–78)
Total Protein: 7 g/dL (ref 6.4–8.2)

## 2013-07-20 LAB — CBC CANCER CENTER
Basophil #: 0.1 "x10 3/mm "
Basophil %: 1.3 %
Eosinophil #: 0.1 "x10 3/mm "
Eosinophil %: 2.4 %
HCT: 33.1 % — ABNORMAL LOW
HGB: 11.1 g/dL — ABNORMAL LOW
Lymphocyte %: 30.7 %
Lymphs Abs: 1.5 "x10 3/mm "
MCH: 28.8 pg
MCHC: 33.4 g/dL
MCV: 86 fL
Monocyte #: 0.3 "x10 3/mm "
Monocyte %: 7.2 %
Neutrophil #: 2.8 "x10 3/mm "
Neutrophil %: 58.4 %
Platelet: 342 "x10 3/mm "
RBC: 3.85 "x10 6/mm "
RDW: 14.8 % — ABNORMAL HIGH
WBC: 4.8 "x10 3/mm "

## 2013-07-21 LAB — CANCER ANTIGEN 27.29: CA 27.29: 22.3 U/mL (ref 0.0–38.6)

## 2013-07-30 ENCOUNTER — Ambulatory Visit: Payer: Self-pay | Admitting: Oncology

## 2013-08-31 ENCOUNTER — Ambulatory Visit: Payer: Self-pay | Admitting: Oncology

## 2013-09-30 ENCOUNTER — Ambulatory Visit: Payer: Self-pay | Admitting: Oncology

## 2013-10-28 ENCOUNTER — Ambulatory Visit: Payer: Self-pay | Admitting: Oncology

## 2013-11-23 LAB — CBC CANCER CENTER
Basophil #: 0.1 x10 3/mm (ref 0.0–0.1)
Basophil %: 0.9 %
EOS PCT: 1.9 %
Eosinophil #: 0.1 x10 3/mm (ref 0.0–0.7)
HCT: 34.1 % — AB (ref 35.0–47.0)
HGB: 11.5 g/dL — ABNORMAL LOW (ref 12.0–16.0)
Lymphocyte #: 1.5 x10 3/mm (ref 1.0–3.6)
Lymphocyte %: 21.3 %
MCH: 28.6 pg (ref 26.0–34.0)
MCHC: 33.6 g/dL (ref 32.0–36.0)
MCV: 85 fL (ref 80–100)
MONO ABS: 0.5 x10 3/mm (ref 0.2–0.9)
Monocyte %: 7.5 %
NEUTROS ABS: 4.8 x10 3/mm (ref 1.4–6.5)
NEUTROS PCT: 68.4 %
Platelet: 351 x10 3/mm (ref 150–440)
RBC: 4.01 10*6/uL (ref 3.80–5.20)
RDW: 14.4 % (ref 11.5–14.5)
WBC: 6.9 x10 3/mm (ref 3.6–11.0)

## 2013-11-23 LAB — COMPREHENSIVE METABOLIC PANEL
Albumin: 3.9 g/dL (ref 3.4–5.0)
Alkaline Phosphatase: 93 U/L
Anion Gap: 9 (ref 7–16)
BUN: 24 mg/dL — ABNORMAL HIGH (ref 7–18)
Bilirubin,Total: 0.3 mg/dL (ref 0.2–1.0)
Calcium, Total: 9.5 mg/dL (ref 8.5–10.1)
Chloride: 100 mmol/L (ref 98–107)
Co2: 28 mmol/L (ref 21–32)
Creatinine: 1.41 mg/dL — ABNORMAL HIGH (ref 0.60–1.30)
EGFR (African American): 47 — ABNORMAL LOW
EGFR (Non-African Amer.): 41 — ABNORMAL LOW
GLUCOSE: 199 mg/dL — AB (ref 65–99)
OSMOLALITY: 283 (ref 275–301)
POTASSIUM: 4.6 mmol/L (ref 3.5–5.1)
SGOT(AST): 30 U/L (ref 15–37)
SGPT (ALT): 36 U/L (ref 12–78)
Sodium: 137 mmol/L (ref 136–145)
Total Protein: 7.2 g/dL (ref 6.4–8.2)

## 2013-11-24 LAB — CANCER ANTIGEN 27.29: CA 27.29: 22.5 U/mL (ref 0.0–38.6)

## 2013-11-28 ENCOUNTER — Ambulatory Visit: Payer: Self-pay | Admitting: Oncology

## 2013-12-10 ENCOUNTER — Ambulatory Visit: Payer: Self-pay | Admitting: Anesthesiology

## 2013-12-14 ENCOUNTER — Ambulatory Visit: Payer: Self-pay | Admitting: Surgery

## 2014-06-07 ENCOUNTER — Ambulatory Visit: Payer: Self-pay | Admitting: Oncology

## 2014-06-07 LAB — CBC CANCER CENTER
Basophil #: 0.1 x10 3/mm (ref 0.0–0.1)
Basophil %: 1.1 %
Eosinophil #: 0.2 x10 3/mm (ref 0.0–0.7)
Eosinophil %: 3.2 %
HCT: 36.6 % (ref 35.0–47.0)
HGB: 12.4 g/dL (ref 12.0–16.0)
Lymphocyte #: 1.7 x10 3/mm (ref 1.0–3.6)
Lymphocyte %: 29.3 %
MCH: 29.2 pg (ref 26.0–34.0)
MCHC: 34 g/dL (ref 32.0–36.0)
MCV: 86 fL (ref 80–100)
MONO ABS: 0.4 x10 3/mm (ref 0.2–0.9)
Monocyte %: 6.8 %
Neutrophil #: 3.4 x10 3/mm (ref 1.4–6.5)
Neutrophil %: 59.6 %
PLATELETS: 352 x10 3/mm (ref 150–440)
RBC: 4.25 10*6/uL (ref 3.80–5.20)
RDW: 14.9 % — AB (ref 11.5–14.5)
WBC: 5.6 x10 3/mm (ref 3.6–11.0)

## 2014-06-07 LAB — COMPREHENSIVE METABOLIC PANEL
ALK PHOS: 74 U/L
ALT: 30 U/L
ANION GAP: 8 (ref 7–16)
Albumin: 3.8 g/dL (ref 3.4–5.0)
BILIRUBIN TOTAL: 0.5 mg/dL (ref 0.2–1.0)
BUN: 12 mg/dL (ref 7–18)
CALCIUM: 8.7 mg/dL (ref 8.5–10.1)
CREATININE: 1.04 mg/dL (ref 0.60–1.30)
Chloride: 102 mmol/L (ref 98–107)
Co2: 28 mmol/L (ref 21–32)
EGFR (Non-African Amer.): 58 — ABNORMAL LOW
GLUCOSE: 199 mg/dL — AB (ref 65–99)
Osmolality: 281 (ref 275–301)
Potassium: 4.5 mmol/L (ref 3.5–5.1)
SGOT(AST): 27 U/L (ref 15–37)
SODIUM: 138 mmol/L (ref 136–145)
TOTAL PROTEIN: 7.1 g/dL (ref 6.4–8.2)

## 2014-06-08 LAB — CANCER ANTIGEN 27.29: CA 27.29: 17.4 U/mL (ref 0.0–38.6)

## 2014-06-30 ENCOUNTER — Ambulatory Visit: Payer: Self-pay | Admitting: Oncology

## 2014-07-30 ENCOUNTER — Ambulatory Visit: Payer: Self-pay | Admitting: Oncology

## 2014-11-28 ENCOUNTER — Ambulatory Visit
Admit: 2014-11-28 | Disposition: A | Discharge status: Home / Self Care | Payer: Self-pay | Attending: Oncology | Admitting: Oncology

## 2014-11-28 LAB — CBC CANCER CENTER
Basophil #: 0.1 x10 3/mm (ref 0.0–0.1)
Basophil %: 0.9 %
EOS PCT: 2 %
Eosinophil #: 0.1 x10 3/mm (ref 0.0–0.7)
HCT: 38.8 % (ref 35.0–47.0)
HGB: 12.9 g/dL (ref 12.0–16.0)
LYMPHS ABS: 1.9 x10 3/mm (ref 1.0–3.6)
Lymphocyte %: 28.9 %
MCH: 27.9 pg (ref 26.0–34.0)
MCHC: 33.3 g/dL (ref 32.0–36.0)
MCV: 84 fL (ref 80–100)
MONO ABS: 0.3 x10 3/mm (ref 0.2–0.9)
MONOS PCT: 5.3 %
NEUTROS ABS: 4.1 x10 3/mm (ref 1.4–6.5)
Neutrophil %: 62.9 %
PLATELETS: 369 x10 3/mm (ref 150–440)
RBC: 4.64 10*6/uL (ref 3.80–5.20)
RDW: 14.6 % — AB (ref 11.5–14.5)
WBC: 6.5 x10 3/mm (ref 3.6–11.0)

## 2014-11-28 LAB — COMPREHENSIVE METABOLIC PANEL
ALBUMIN: 4.5 g/dL
AST: 26 U/L
Alkaline Phosphatase: 78 U/L
Anion Gap: 8 (ref 7–16)
BUN: 16 mg/dL
Bilirubin,Total: 0.5 mg/dL
CALCIUM: 9.9 mg/dL
CHLORIDE: 99 mmol/L — AB
Co2: 28 mmol/L
Creatinine: 0.96 mg/dL
GLUCOSE: 215 mg/dL — AB
POTASSIUM: 4.7 mmol/L
SGPT (ALT): 28 U/L
Sodium: 135 mmol/L
Total Protein: 7.6 g/dL

## 2014-11-29 ENCOUNTER — Ambulatory Visit
Admit: 2014-11-29 | Disposition: A | Discharge status: Home / Self Care | Payer: Self-pay | Attending: Oncology | Admitting: Oncology

## 2014-11-29 LAB — CANCER ANTIGEN 27.29: CA 27.29: 23.4 U/mL (ref 0.0–38.6)

## 2014-12-21 NOTE — Op Note (Signed)
PATIENT NAME:  Kimberly [name], Kimberly [name] MR#:  [id number] DATE OF BIRTH:  Jun 21, 1956  DATE OF PROCEDURE:  12/14/2013  PREOPERATIVE DIAGNOSIS: Port-A-Cath.   POSTOPERATIVE DIAGNOSIS: Port-A-Cath.   SURGEON: Consuela Mimes, MD  ANESTHESIA: Local MAC.   OPERATION PERFORMED: Port-A-Cath removal.   PROCEDURE IN DETAIL: The patient was placed supine on the operating room table and prepped and draped in the usual sterile fashion. An incision was made through the insertion incision and carried down to the hub and port device, which was dissected free of its surrounding fibrin sheath. Prior to removing the catheter portion, a figure-of-eight stitch of 2-0 Vicryl was placed around the fibrin sheath and tied down as the catheter was removed. This was also used to diminish the dead space with a deep closing suture, and then the skin was reapproximated with a running subcuticular 5-0 Monocryl and suture strip. The patient tolerated the procedure well. There were no complications.   ____________________________ Consuela Mimes, MD wfm:lb D: 12/14/2013 09:37:21 ET T: 12/14/2013 10:44:34 ET JOB#: [id number]  cc: Consuela Mimes, MD, <Dictator> Consuela Mimes MD ELECTRONICALLY SIGNED 12/14/2013 17:50

## 2015-05-23 ENCOUNTER — Other Ambulatory Visit: Payer: Self-pay | Admitting: *Deleted

## 2015-05-23 DIAGNOSIS — C50519 Malignant neoplasm of lower-outer quadrant of unspecified female breast: Secondary | ICD-10-CM

## 2015-05-29 ENCOUNTER — Inpatient Hospital Stay: Payer: BLUE CROSS/BLUE SHIELD | Admitting: Oncology

## 2015-05-29 ENCOUNTER — Other Ambulatory Visit: Payer: Self-pay

## 2015-05-29 ENCOUNTER — Inpatient Hospital Stay: Payer: BLUE CROSS/BLUE SHIELD

## 2015-05-29 ENCOUNTER — Ambulatory Visit: Payer: Self-pay | Admitting: Oncology

## 2015-06-24 ENCOUNTER — Inpatient Hospital Stay: Payer: BLUE CROSS/BLUE SHIELD | Attending: Oncology | Admitting: Oncology

## 2015-06-24 ENCOUNTER — Inpatient Hospital Stay: Payer: BLUE CROSS/BLUE SHIELD

## 2015-06-24 VITALS — BP 156/86 | HR 88 | Temp 97.3°F | Wt 191.6 lb

## 2015-06-24 DIAGNOSIS — Z794 Long term (current) use of insulin: Secondary | ICD-10-CM | POA: Insufficient documentation

## 2015-06-24 DIAGNOSIS — Z923 Personal history of irradiation: Secondary | ICD-10-CM | POA: Insufficient documentation

## 2015-06-24 DIAGNOSIS — I10 Essential (primary) hypertension: Secondary | ICD-10-CM | POA: Diagnosis not present

## 2015-06-24 DIAGNOSIS — E119 Type 2 diabetes mellitus without complications: Secondary | ICD-10-CM | POA: Insufficient documentation

## 2015-06-24 DIAGNOSIS — Z79899 Other long term (current) drug therapy: Secondary | ICD-10-CM | POA: Insufficient documentation

## 2015-06-24 DIAGNOSIS — Z803 Family history of malignant neoplasm of breast: Secondary | ICD-10-CM | POA: Insufficient documentation

## 2015-06-24 DIAGNOSIS — Z9221 Personal history of antineoplastic chemotherapy: Secondary | ICD-10-CM | POA: Insufficient documentation

## 2015-06-24 DIAGNOSIS — Z79811 Long term (current) use of aromatase inhibitors: Secondary | ICD-10-CM | POA: Insufficient documentation

## 2015-06-24 DIAGNOSIS — Z170 Estrogen receptor positive status [ER+]: Secondary | ICD-10-CM | POA: Insufficient documentation

## 2015-06-24 DIAGNOSIS — E7800 Pure hypercholesterolemia, unspecified: Secondary | ICD-10-CM | POA: Diagnosis not present

## 2015-06-24 DIAGNOSIS — C50519 Malignant neoplasm of lower-outer quadrant of unspecified female breast: Secondary | ICD-10-CM

## 2015-06-24 DIAGNOSIS — Z9013 Acquired absence of bilateral breasts and nipples: Secondary | ICD-10-CM | POA: Diagnosis not present

## 2015-06-24 DIAGNOSIS — C50511 Malignant neoplasm of lower-outer quadrant of right female breast: Secondary | ICD-10-CM | POA: Insufficient documentation

## 2015-06-24 DIAGNOSIS — Z806 Family history of leukemia: Secondary | ICD-10-CM | POA: Diagnosis not present

## 2015-06-24 LAB — CBC WITH DIFFERENTIAL/PLATELET
BASOS PCT: 1 %
Basophils Absolute: 0.1 10*3/uL (ref 0–0.1)
EOS ABS: 0.1 10*3/uL (ref 0–0.7)
EOS PCT: 2 %
HCT: 37.4 % (ref 35.0–47.0)
HEMOGLOBIN: 12.5 g/dL (ref 12.0–16.0)
Lymphocytes Relative: 31 %
Lymphs Abs: 1.8 10*3/uL (ref 1.0–3.6)
MCH: 28.1 pg (ref 26.0–34.0)
MCHC: 33.4 g/dL (ref 32.0–36.0)
MCV: 84.1 fL (ref 80.0–100.0)
Monocytes Absolute: 0.4 10*3/uL (ref 0.2–0.9)
Monocytes Relative: 6 %
NEUTROS PCT: 60 %
Neutro Abs: 3.4 10*3/uL (ref 1.4–6.5)
PLATELETS: 353 10*3/uL (ref 150–440)
RBC: 4.45 MIL/uL (ref 3.80–5.20)
RDW: 14.8 % — ABNORMAL HIGH (ref 11.5–14.5)
WBC: 5.7 10*3/uL (ref 3.6–11.0)

## 2015-06-24 LAB — COMPREHENSIVE METABOLIC PANEL
ALBUMIN: 4.5 g/dL (ref 3.5–5.0)
ALK PHOS: 70 U/L (ref 38–126)
ALT: 25 U/L (ref 14–54)
ANION GAP: 8 (ref 5–15)
AST: 30 U/L (ref 15–41)
BUN: 17 mg/dL (ref 6–20)
CALCIUM: 9.3 mg/dL (ref 8.9–10.3)
CHLORIDE: 100 mmol/L — AB (ref 101–111)
CO2: 25 mmol/L (ref 22–32)
Creatinine, Ser: 0.98 mg/dL (ref 0.44–1.00)
GFR calc non Af Amer: >60 mL/min (ref >60)
GLUCOSE: 242 mg/dL — AB (ref 65–99)
Potassium: 5 mmol/L (ref 3.5–5.1)
Sodium: 133 mmol/L — ABNORMAL LOW (ref 135–145)
Total Bilirubin: 0.7 mg/dL (ref 0.3–1.2)
Total Protein: 7.7 g/dL (ref 6.5–8.1)

## 2015-06-24 MED ORDER — LETROZOLE 2.5 MG PO TABS: 2.5000 mg | ORAL_TABLET | Freq: Every day | ORAL | Status: DC

## 2015-06-24 MED ORDER — FERROCITE 324 MG PO TABS: 1.0000 | ORAL_TABLET | Freq: Every day | ORAL | Status: DC

## 2015-06-24 NOTE — Progress Notes (Signed)
Patient requesting refill for Iron and Letrozole.  Also asking for [facility] sticker.

## 2015-06-25 LAB — CANCER ANTIGEN 27.29: CA 27.29: 30.5 U/mL (ref 0.0–38.6)

## 2015-06-29 ENCOUNTER — Encounter: Payer: Self-pay | Admitting: Oncology

## 2015-06-29 NOTE — Progress Notes (Signed)
[place] @ [facility] [phone]  [phone]     Raina Sole Keator OB: [birth year]  MR#: [id number]  ZJI#:[id number]  Patient Care Team: Shepard General, MD as PCP - General (Unknown Physician Specialty) Neldon Mc, MD (General Surgery) Everlene Farrier, MD (Obstetrics and Gynecology) Noreene Filbert, MD Forest Gleason, MD (Unknown Physician Specialty)  CHIEF COMPLAINT:  Chief Complaint  Patient presents with  . OTHER  Subjective: Chief Complaint/Diagnosis:   76. 59 year old female with ca breast, staging T2 N2 M0 tumor from biopsy.  Estrogen receptor positive, Progesterone receptor positive.  Current receptor negative by FISH 2. Neoadjuvant chemotherapy started in December of 28 with Cytoxan Adriamycin 3. Started on Taxol weekly chemotherapy. 4. Patient finished 12  cycles of Taxol chemotherapy in May of 2013.     5. Status post right modified radical mastectomy June of 2013, ypT1c  yp N2  MO. started also on Lerazole 6. Status post left side prophylactic mastectomy.   7. Radiation therapy to the right breast (September of 2013).  Lymph node was positive for HER-2 receptor gene amplification of 2.22.  Will proceed with Herceptin treatment starting in September of 2013.   8.Patient has finished Herceptin (maintenance therapy) in August of 2014 8. Start patient on letrozole from November, 2013. 9. Patient started on Herceptin in September 2013.   10Patient finished 12 months of Herceptin therapy on August, 2014   No history exists.    Oncology Flowsheet 02/06/2013 02/06/2013 02/07/2013 02/07/2013 02/07/2013 02/08/2013 02/08/2013  dexamethasone (DECADRON) IJ -   - - - - -  enoxaparin (LOVENOX)  - - - - - 40 mg    letrozole (FEMARA) PO - - 2.5 mg     2.5 mg    metoCLOPramide (REGLAN) IJ -   - - - - -  metoCLOPramide (REGLAN) PO - - 10 mg 10 mg 10 mg 10 mg 10 mg  ondansetron (ZOFRAN) IJ - - - - - - -  ondansetron (ZOFRAN) IV 4 mg 4 mg 4 mg     - -    promethazine (PHENERGAN) IV 6.25 mg   - - - 6.25 mg    scopolamine 1 MG/3DAYS (TRANSDERM-SCOP) TD - - - - - - -    INTERVAL HISTORY: 59 year old lady with a history of carcinoma breast status post lumpectomy bilateral mastectomy with reconstructive surgery.  Patient had chemotherapy followed by anti-hormonal treatment Continue letrozole calcium and vitamin D.  No bony pain.  Because patient had bilateral mastectomy and reconstructive surgery no further mammogram is been recommended. REVIEW OF SYSTEMS:   GENERAL:  Feels good.  Active.  No fevers, sweats or weight loss. PERFORMANCE STATUS (ECOG):0 HEENT:  No visual changes, runny nose, sore throat, mouth sores or tenderness. Lungs: No shortness of breath or cough.  No hemoptysis. Cardiac:  No chest pain, palpitations, orthopnea, or PND. GI:  No nausea, vomiting, diarrhea, constipation, melena or hematochezia. GU:  No urgency, frequency, dysuria, or hematuria. Musculoskeletal:  No back pain.  No joint pain.  No muscle tenderness. Extremities:  No pain or swelling. Skin:  No rashes or skin changes. Neuro:  No headache, numbness or weakness, balance or coordination issues. Endocrine:  No diabetes, thyroid issues, hot flashes or night sweats. Psych:  No mood changes, depression or anxiety. Pain:  No focal pain. Review of systems:  All other systems reviewed and found to be negative. As per HPI. Otherwise, a complete review of systems is negatve.  PAST MEDICAL HISTORY: Past Medical History  Diagnosis  Date  . Hypertension   . Cancer of lower-outer quadrant of female breast ([place]) 08/06/2011  . High cholesterol   . PONV (postoperative nausea and vomiting)   . Type II diabetes mellitus ([facility])     fasting 140-150  . Diabetes mellitus ([place]) 03/24/2012    PAST SURGICAL HISTORY: Past Surgical History  Procedure Laterality Date  . Cesarean section  [id number]  . Wisdom tooth extraction    . Portacath placement  07/2011  . Mastectomy  03/07/12     modified right; total left  . Breast biopsy  07/2011    right  . Modified mastectomy  03/07/2012    Procedure: MODIFIED MASTECTOMY;  Surgeon: Haywood Lasso, MD;  Location: [place];  Service: General;  Laterality: Right;  . Breast reconstruction  03/07/2012    Procedure: BREAST RECONSTRUCTION;  Surgeon: Crissie Reese, MD;  Location: [place];  Service: Plastics;  Laterality: Left;  BREAST RECONSTRUCTION WITH PLACEMENT OF TISSUE EXPANDER TO LEFT BREAST  . Scar revision  03/30/2012    Procedure: SCAR REVISION;  Surgeon: Haywood Lasso, MD;  Location: [place];  Service: General;  Laterality: Right;  CLOSURE OF MASTECTOMY INCISION  . Breast reconstruction  02/06/2013  . Latissimus flap to breast Right 02/06/2013    Procedure: LATISSIMUS FLAP TO RIGHT BREAST WITH IMPLANT;  Surgeon: Crissie Reese, MD;  Location: [place];  Service: Plastics;  Laterality: Right;    FAMILY HISTORY Family History  Problem Relation Age of Onset  . Cancer Mother     breast   Preventive Screening:  Has patient had any of the following test? Colonscopy  Mammography (1)   Last Colonoscopy: 2009(1)   Last Mammography: November 2012 - bilateral mastectomy 2014(1)   Smoking History: Smoking History Never Smoked.(1)  PFSH: Comments: Mother had breast cancer with positive lymph node treated with lumpectomy radiation and NSABP protocol with chemotherapy.    She developed acute myelogenous leukemia and myelodysplastic syndrome  Social History: negative alcohol, negative tobacco  Comments: Negative estrogen supplements.  Additional Past Medical and Surgical History: Diabetes controlled with oral medication.  Hypertension controlled with oral medication.   ADVANCED DIRECTIVES:  Patient does have advance healthcare directive, Patient   does not desire to make any changes HEALTH MAINTENANCE: Social History  Substance Use Topics  . Smoking status: Never Smoker   . Smokeless tobacco: Never Used  .  Alcohol Use: No      Allergies  Allergen Reactions  . Ciprofloxacin Nausea And Vomiting  . Sulfa Antibiotics Rash    Current Outpatient Prescriptions  Medication Sig Dispense Refill  . Calcium Carbonate-Vitamin D (CALCIUM 600 + D PO) Take 1 tablet by mouth 2 (two) times daily.    . insulin glargine (LANTUS) 100 UNIT/ML injection Inject 38-40 Units into the skin at bedtime.    Marland Kitchen letrozole (FEMARA) 2.5 MG tablet Take 1 tablet (2.5 mg total) by mouth daily. 30 tablet 6  . lisinopril (PRINIVIL,ZESTRIL) 10 MG tablet Take 10 mg by mouth daily.    Marland Kitchen atorvastatin (LIPITOR) 20 MG tablet Take 20 mg by mouth daily.  4  . docusate sodium 100 MG CAPS Take 100 mg by mouth daily. (Patient not taking: Reported on 06/24/2015) 10 capsule 0  . FERROCITE 324 MG TABS Take 1 tablet by mouth daily. 30 tablet 6  . KOMBIGLYZE XR 2.12-998 MG TB24 Take 1 tablet by mouth 2 (two) times daily.  99   No current facility-administered medications for this visit.  OBJECTIVE:  Filed Vitals:   06/24/15 1209  BP: 156/86  Pulse: 88  Temp: 97.3 F (36.3 C)     Body mass index is 35.03 kg/(m^2).    ECOG FS:0 - Asymptomatic  PHYSICAL EXAM: General  status: Performance status is good.  Patient has not lost significant weight. Since last evaluation there is no significant change in the general status HEENT: No evidence of stomatitis. Sclera and conjunctivae :: No jaundice.   pale looking. Lungs: Air  entry equal on both sides.  No rhonchi.  No rales.  Cardiac: Heart sounds are normal.  No pericardial rub.  No murmur. Lymphatic system: Cervical, axillary, inguinal, lymph nodes not palpable GI: Abdomen is soft.liver and spleen not palpable.  No ascites.  Bowel sounds are normal.  No other palpable masses.  No tenderness . Lower extremity: No edema Neurological system: Higher functions, cranial nerves intact no evidence of peripheral neuropathy. Skin: No rash.  No ecchymosis.. No petechial hemorrhages  Combination  of both breast status post prophylactic mastectomy with reconstructive surgery no palpable masses LAB RESULTS:  CBC Latest Ref Rng 06/24/2015 11/28/2014  WBC 3.6 - 11.0 K/uL 5.7 6.5  Hemoglobin 12.0 - 16.0 g/dL 12.5 12.9  Hematocrit 35.0 - 47.0 % 37.4 38.8  Platelets 150 - 440 K/uL 353 369    Appointment on 06/24/2015  Component Date Value Ref Range Status  . WBC 06/24/2015 5.7  3.6 - 11.0 K/uL Final  . RBC 06/24/2015 4.45  3.80 - 5.20 MIL/uL Final  . Hemoglobin 06/24/2015 12.5  12.0 - 16.0 g/dL Final  . HCT 06/24/2015 37.4  35.0 - 47.0 % Final  . MCV 06/24/2015 84.1  80.0 - 100.0 fL Final  . MCH 06/24/2015 28.1  26.0 - 34.0 pg Final  . MCHC 06/24/2015 33.4  32.0 - 36.0 g/dL Final  . RDW 06/24/2015 14.8* 11.5 - 14.5 % Final  . Platelets 06/24/2015 353  150 - 440 K/uL Final  . Neutrophils Relative % 06/24/2015 60   Final  . Neutro Abs 06/24/2015 3.4  1.4 - 6.5 K/uL Final  . Lymphocytes Relative 06/24/2015 31   Final  . Lymphs Abs 06/24/2015 1.8  1.0 - 3.6 K/uL Final  . Monocytes Relative 06/24/2015 6   Final  . Monocytes Absolute 06/24/2015 0.4  0.2 - 0.9 K/uL Final  . Eosinophils Relative 06/24/2015 2   Final  . Eosinophils Absolute 06/24/2015 0.1  0 - 0.7 K/uL Final  . Basophils Relative 06/24/2015 1   Final  . Basophils Absolute 06/24/2015 0.1  0 - 0.1 K/uL Final  . Sodium 06/24/2015 133* 135 - 145 mmol/L Final  . Potassium 06/24/2015 5.0  3.5 - 5.1 mmol/L Final  . Chloride 06/24/2015 100* 101 - 111 mmol/L Final  . CO2 06/24/2015 25  22 - 32 mmol/L Final  . Glucose, Bld 06/24/2015 242* 65 - 99 mg/dL Final  . BUN 06/24/2015 17  6 - 20 mg/dL Final  . Creatinine, Ser 06/24/2015 0.98  0.44 - 1.00 mg/dL Final  . Calcium 06/24/2015 9.3  8.9 - 10.3 mg/dL Final  . Total Protein 06/24/2015 7.7  6.5 - 8.1 g/dL Final  . Albumin 06/24/2015 4.5  3.5 - 5.0 g/dL Final  . AST 06/24/2015 30  15 - 41 U/L Final  . ALT 06/24/2015 25  14 - 54 U/L Final  . Alkaline Phosphatase 06/24/2015 70  38  - 126 U/L Final  . Total Bilirubin 06/24/2015 0.7  0.3 - 1.2 mg/dL Final  .  GFR calc non Af Amer 06/24/2015 >60  >60 mL/min Final  . GFR calc Af Amer 06/24/2015 >60  >60 mL/min Final   Comment: (NOTE) The eGFR has been calculated using the CKD EPI equation. This calculation has not been validated in all clinical situations. eGFR's persistently <60 mL/min signify possible Chronic Kidney Disease.   . Anion gap 06/24/2015 8  5 - 15 Final  . CA 27.29 06/24/2015 30.5  0.0 - 38.6 U/mL Final   Comment: (NOTE) [employer]/ACS methodology Performed At: [facility] [place], [place] [id number] Lindon Romp MD WU:[id number]        ASSESSMENT: Carcinoma breast status post chemotherapy status post bilateral mastectomy with reconstructive surgery On clinical examination and review of lab data there is no evidence of recurrent disease Continue letrozole calcium and vitamin D.  We will try to get information about her bone density study which has been done at primary care physician's office [place]  guidelines for follow-up in breast cancer patient History and physical 1-4 times per year as clinically indicated for first 5 years Periodic screening for changes in the family history and referable to genetic counseling is necessary. Mammographic every 12 months Educated monitor and refer patient for lymphedema management if needed Routine imaging of reconstructed breasts is not indicated In absence of clinical signs and symptoms suggestive of recurrent disease there is no indication for lab or imaging studies for metastatic screening Recommend on tamoxifen needs annual GYN evaluation if uterus is present.  Patient on aromatase inhibitor needs bone density study for evaluation regarding osteoporosis Evidence suggests that active lifestyle healthy diet limited alcohol intake and achieving i and maintaining ideal body weight may lead to optimal breast cancer outcome  Patient  expressed understanding and was in agreement with this plan. She also understands that She can call clinic at any time with any questions, concerns, or complaints.    Cancer of lower-outer quadrant of female breast [facility])   Staging form: Breast, AJCC 7th Edition     Pathologic: ypT1c,ypN2a, MX - Signed by Haywood Lasso, MD on 03/10/2012   Forest Gleason, MD   06/29/2015 11:12 AM

## 2015-07-02 ENCOUNTER — Encounter: Payer: Self-pay | Admitting: Radiation Oncology

## 2015-07-02 ENCOUNTER — Ambulatory Visit
Admission: RE | Admit: 2015-07-02 | Discharge: 2015-07-02 | Disposition: A | Discharge status: Home / Self Care | Payer: BLUE CROSS/BLUE SHIELD | Source: Ambulatory Visit | Attending: Radiation Oncology | Admitting: Radiation Oncology

## 2015-07-02 VITALS — BP 146/94 | HR 87 | Resp 18 | Wt 192.0 lb

## 2015-07-02 DIAGNOSIS — C50911 Malignant neoplasm of unspecified site of right female breast: Secondary | ICD-10-CM

## 2015-07-02 NOTE — Progress Notes (Signed)
Radiation Oncology Follow up Note  Name: Kimberly [name]   Date:   07/02/2015 MRN:  [id number] DOB: [birth year]    This 59 y.o. female presents to the clinic today for follow-up for breast cancer stage TII N2 M0 ER/PR positive HER-2/neu negative status post new adjuvant chemotherapy right modified radical mastectomy left-sided prophylactic mastectomy and radiation therapy to right breast.  REFERRING PROVIDER: Shepard General, MD  HPI: Now out 66 years 59 year old female now out 3 years having completed radiation therapy to her right chest after a right modified radical mastectomy for a T2 N2 ER/PR positive HER-2/neu negative invasive mammary carcinoma treated with neoadjuvant chemotherapy. Her lymph node was positive for HER-2/neu gene amplification and she was treated with Herceptin. She seen today in routine follow-up and is doing well. She specifically denies chest wall pain cough or bone pain. Currently on letrozole tolerating that well without side effect..  COMPLICATIONS OF TREATMENT: none  FOLLOW UP COMPLIANCE: keeps appointments   PHYSICAL EXAM:  BP 146/94 mmHg  Pulse 87  Resp 18  Wt 192 lb 0.3 oz (87.1 kg) She is status post right modified radical mastectomy with reconstruction also prophylactic mastectomy of the left breast. No evidence of chest wall mass or nodularity no axillary or supraclavicular adenopathy is appreciated. No evidence of lymphedema of her right upper extremity is noted. Well-developed well-nourished patient in NAD. HEENT reveals PERLA, EOMI, discs not visualized.  Oral cavity is clear. No oral mucosal lesions are identified. Neck is clear without evidence of cervical or supraclavicular adenopathy. Lungs are clear to A&P. Cardiac examination is essentially unremarkable with regular rate and rhythm without murmur rub or thrill. Abdomen is benign with no organomegaly or masses noted. Motor sensory and DTR levels are equal and symmetric in the upper and lower  extremities. Cranial nerves II through XII are grossly intact. Proprioception is intact. No peripheral adenopathy or edema is identified. No motor or sensory levels are noted. Crude visual fields are within normal range.  RADIOLOGY RESULTS: No current films for review  PLAN: At the present time she continues to do well with no evidence of disease. I have asked to see her back in 1 year for follow-up. She knows to call sooner with any concerns. She continues close follow-up care with medical oncology.  I would like to take this opportunity for allowing me to participate in the care of your patient.Armstead Peaks., MD

## 2015-12-23 ENCOUNTER — Inpatient Hospital Stay: Payer: BLUE CROSS/BLUE SHIELD | Attending: Oncology

## 2015-12-23 ENCOUNTER — Inpatient Hospital Stay (HOSPITAL_BASED_OUTPATIENT_CLINIC_OR_DEPARTMENT_OTHER): Payer: BLUE CROSS/BLUE SHIELD | Admitting: Oncology

## 2015-12-23 ENCOUNTER — Encounter: Payer: Self-pay | Admitting: Oncology

## 2015-12-23 VITALS — BP 151/88 | HR 81 | Temp 97.0°F | Wt 184.7 lb

## 2015-12-23 DIAGNOSIS — Z170 Estrogen receptor positive status [ER+]: Secondary | ICD-10-CM

## 2015-12-23 DIAGNOSIS — C50511 Malignant neoplasm of lower-outer quadrant of right female breast: Secondary | ICD-10-CM

## 2015-12-23 DIAGNOSIS — Z923 Personal history of irradiation: Secondary | ICD-10-CM

## 2015-12-23 DIAGNOSIS — I10 Essential (primary) hypertension: Secondary | ICD-10-CM | POA: Diagnosis not present

## 2015-12-23 DIAGNOSIS — E785 Hyperlipidemia, unspecified: Secondary | ICD-10-CM

## 2015-12-23 DIAGNOSIS — Z9013 Acquired absence of bilateral breasts and nipples: Secondary | ICD-10-CM | POA: Insufficient documentation

## 2015-12-23 DIAGNOSIS — Z803 Family history of malignant neoplasm of breast: Secondary | ICD-10-CM | POA: Diagnosis not present

## 2015-12-23 DIAGNOSIS — Z79811 Long term (current) use of aromatase inhibitors: Secondary | ICD-10-CM

## 2015-12-23 DIAGNOSIS — Z794 Long term (current) use of insulin: Secondary | ICD-10-CM

## 2015-12-23 DIAGNOSIS — Z79899 Other long term (current) drug therapy: Secondary | ICD-10-CM | POA: Insufficient documentation

## 2015-12-23 DIAGNOSIS — E119 Type 2 diabetes mellitus without complications: Secondary | ICD-10-CM | POA: Insufficient documentation

## 2015-12-23 DIAGNOSIS — Z9221 Personal history of antineoplastic chemotherapy: Secondary | ICD-10-CM | POA: Diagnosis not present

## 2015-12-23 DIAGNOSIS — C50519 Malignant neoplasm of lower-outer quadrant of unspecified female breast: Secondary | ICD-10-CM

## 2015-12-23 LAB — CBC WITH DIFFERENTIAL/PLATELET
BASOS ABS: 0 10*3/uL (ref 0–0.1)
BASOS PCT: 1 %
EOS ABS: 0.3 10*3/uL (ref 0–0.7)
Eosinophils Relative: 6 %
HEMATOCRIT: 35.6 % (ref 35.0–47.0)
HEMOGLOBIN: 12.1 g/dL (ref 12.0–16.0)
Lymphocytes Relative: 33 %
Lymphs Abs: 2 10*3/uL (ref 1.0–3.6)
MCH: 28.4 pg (ref 26.0–34.0)
MCHC: 34.2 g/dL (ref 32.0–36.0)
MCV: 83.2 fL (ref 80.0–100.0)
Monocytes Absolute: 0.4 10*3/uL (ref 0.2–0.9)
Monocytes Relative: 6 %
NEUTROS ABS: 3.4 10*3/uL (ref 1.4–6.5)
Neutrophils Relative %: 54 %
Platelets: 333 10*3/uL (ref 150–440)
RBC: 4.27 MIL/uL (ref 3.80–5.20)
RDW: 15.1 % — AB (ref 11.5–14.5)
WBC: 6.1 10*3/uL (ref 3.6–11.0)

## 2015-12-23 LAB — COMPREHENSIVE METABOLIC PANEL
ALBUMIN: 4.4 g/dL (ref 3.5–5.0)
ALK PHOS: 52 U/L (ref 38–126)
ALT: 20 U/L (ref 14–54)
AST: 25 U/L (ref 15–41)
Anion gap: 4 — ABNORMAL LOW (ref 5–15)
BUN: 20 mg/dL (ref 6–20)
CHLORIDE: 106 mmol/L (ref 101–111)
CO2: 27 mmol/L (ref 22–32)
CREATININE: 0.86 mg/dL (ref 0.44–1.00)
Calcium: 9.4 mg/dL (ref 8.9–10.3)
GFR calc non Af Amer: >60 mL/min (ref >60)
GLUCOSE: 98 mg/dL (ref 65–99)
Potassium: 5 mmol/L (ref 3.5–5.1)
SODIUM: 137 mmol/L (ref 135–145)
Total Bilirubin: 0.6 mg/dL (ref 0.3–1.2)
Total Protein: 7.4 g/dL (ref 6.5–8.1)

## 2015-12-23 NOTE — Progress Notes (Signed)
[place] @ [facility] [phone]  [phone]     Kinzly Pierrelouis Cozza OB: 05-09-1956  MR#: [id number]  WPY#:[id number]  Patient Care Team: Shepard General, MD as PCP - General (Unknown Physician Specialty) Neldon Mc, MD (General Surgery) Everlene Farrier, MD (Obstetrics and Gynecology) Noreene Filbert, MD Forest Gleason, MD (Unknown Physician Specialty)  CHIEF COMPLAINT:  Chief Complaint  Patient presents with  . Breast Cancer  Subjective: Chief Complaint/Diagnosis:   55. 60 year old female with ca breast, staging T2 N2 M0 tumor from biopsy.  Estrogen receptor positive, Progesterone receptor positive.  Current receptor negative by FISH 2. Neoadjuvant chemotherapy started in December of 28 with Cytoxan Adriamycin 3. Started on Taxol weekly chemotherapy. 4. Patient finished 12  cycles of Taxol chemotherapy in May of 2013.     5. Status post right modified radical mastectomy June of 2013, ypT1c  yp N2  MO. started also on Lerazole 6. Status post left side prophylactic mastectomy.   7. Radiation therapy to the right breast (September of 2013).  Lymph node was positive for HER-2 receptor gene amplification of 2.22.  Will proceed with Herceptin treatment starting in September of 2013.   8.Patient has finished Herceptin (maintenance therapy) in August of 2014 8. Start patient on letrozole from November, 2013. 9. Patient started on Herceptin in September 2013.   10Patient finished 12 months of Herceptin therapy on August, 2014   No history exists.      INTERVAL HISTORY: 60 year old lady with a history of carcinoma breast status post lumpectomy bilateral mastectomy with reconstructive surgery.  Patient had chemotherapy followed by anti-hormonal treatment Continue letrozole calcium and vitamin D.  No bony pain.  Because patient had bilateral mastectomy and reconstructive surgery no further mammogram is been recommended.  She is here for further follow-up no bone  pain.  Getting regular bone density study and primary care office Here for further follow-up and treatment consideration REVIEW OF SYSTEMS:   GENERAL:  Feels good.  Active.  No fevers, sweats or weight loss. PERFORMANCE STATUS (ECOG):0 HEENT:  No visual changes, runny nose, sore throat, mouth sores or tenderness. Lungs: No shortness of breath or cough.  No hemoptysis. Cardiac:  No chest pain, palpitations, orthopnea, or PND. GI:  No nausea, vomiting, diarrhea, constipation, melena or hematochezia. GU:  No urgency, frequency, dysuria, or hematuria. Musculoskeletal:  No back pain.  No joint pain.  No muscle tenderness. Extremities:  No pain or swelling. Skin:  No rashes or skin changes. Neuro:  No headache, numbness or weakness, balance or coordination issues. Endocrine:  No diabetes, thyroid issues, hot flashes or night sweats. Psych:  No mood changes, depression or anxiety. Pain:  No focal pain. Review of systems:  All other systems reviewed and found to be negative. As per HPI. Otherwise, a complete review of systems is negatve.  PAST MEDICAL HISTORY: Past Medical History  Diagnosis Date  . Hypertension   . Cancer of lower-outer quadrant of female breast ([place]) 08/06/2011  . High cholesterol   . PONV (postoperative nausea and vomiting)   . Type II diabetes mellitus (HCC)     fasting 140-150  . Diabetes mellitus ([place]) 03/24/2012    PAST SURGICAL HISTORY: Past Surgical History  Procedure Laterality Date  . Cesarean section  [id number]  . Wisdom tooth extraction    . Portacath placement  07/2011  . Mastectomy  03/07/12    modified right; total left  . Breast biopsy  07/2011    right  . Modified mastectomy  03/07/2012    Procedure: MODIFIED MASTECTOMY;  Surgeon: Haywood Lasso, MD;  Location: [place];  Service: General;  Laterality: Right;  . Breast reconstruction  03/07/2012    Procedure: BREAST RECONSTRUCTION;  Surgeon: Crissie Reese, MD;  Location: [place];  Service: Plastics;   Laterality: Left;  BREAST RECONSTRUCTION WITH PLACEMENT OF TISSUE EXPANDER TO LEFT BREAST  . Scar revision  03/30/2012    Procedure: SCAR REVISION;  Surgeon: Haywood Lasso, MD;  Location: [place];  Service: General;  Laterality: Right;  CLOSURE OF MASTECTOMY INCISION  . Breast reconstruction  02/06/2013  . Latissimus flap to breast Right 02/06/2013    Procedure: LATISSIMUS FLAP TO RIGHT BREAST WITH IMPLANT;  Surgeon: Crissie Reese, MD;  Location: [place];  Service: Plastics;  Laterality: Right;    FAMILY HISTORY Family History  Problem Relation Age of Onset  . Cancer Mother     breast   Preventive Screening:  Has patient had any of the following test? Colonscopy  Mammography (1)   Last Colonoscopy: 2009(1)   Last Mammography: November 2012 - bilateral mastectomy 2014(1)   Smoking History: Smoking History Never Smoked.(1)  PFSH: Comments: Mother had breast cancer with positive lymph node treated with lumpectomy radiation and NSABP protocol with chemotherapy.    She developed acute myelogenous leukemia and myelodysplastic syndrome  Social History: negative alcohol, negative tobacco  Comments: Negative estrogen supplements.  Additional Past Medical and Surgical History: Diabetes controlled with oral medication.  Hypertension controlled with oral medication.   ADVANCED DIRECTIVES:  Patient does have advance healthcare directive, Patient   does not desire to make any changes HEALTH MAINTENANCE: Social History  Substance Use Topics  . Smoking status: Never Smoker   . Smokeless tobacco: Never Used  . Alcohol Use: No      Allergies  Allergen Reactions  . Ciprofloxacin Nausea And Vomiting  . Sulfa Antibiotics Rash    Current Outpatient Prescriptions  Medication Sig Dispense Refill  . atorvastatin (LIPITOR) 20 MG tablet Take 20 mg by mouth daily.  4  . Calcium Carbonate-Vitamin D (CALCIUM 600 + D PO) Take 1 tablet by mouth 2 (two) times daily.    . insulin  glargine (LANTUS) 100 UNIT/ML injection Inject 38-40 Units into the skin at bedtime.    Marland Kitchen KOMBIGLYZE XR 2.12-998 MG TB24 Take 1 tablet by mouth 2 (two) times daily.  99  . letrozole (FEMARA) 2.5 MG tablet Take 1 tablet (2.5 mg total) by mouth daily. 30 tablet 6  . lisinopril (PRINIVIL,ZESTRIL) 10 MG tablet Take 10 mg by mouth daily.     No current facility-administered medications for this visit.    OBJECTIVE:  Filed Vitals:   12/23/15 1003  BP: 151/88  Pulse: 81  Temp: 97 F (36.1 C)     Body mass index is 33.78 kg/(m^2).    ECOG FS:0 - Asymptomatic  PHYSICAL EXAM: General  status: Performance status is good.  Patient has not lost significant weight. Since last evaluation there is no significant change in the general status HEENT: No evidence of stomatitis. Sclera and conjunctivae :: No jaundice.   pale looking. Lungs: Air  entry equal on both sides.  No rhonchi.  No rales.  Cardiac: Heart sounds are normal.  No pericardial rub.  No murmur. Lymphatic system: Cervical, axillary, inguinal, lymph nodes not palpable GI: Abdomen is soft.liver and spleen not palpable.  No ascites.  Bowel sounds are normal.  No other palpable masses.  No tenderness . Lower  extremity: No edema Neurological system: Higher functions, cranial nerves intact no evidence of peripheral neuropathy. Skin: No rash.  No ecchymosis.. No petechial hemorrhages  Evaluation of both breasts: Status post mastectomy and reconstructive surgery No evidence of any palpable lymph nodes or masses LAB RESULTS:  CBC Latest Ref Rng 12/23/2015 06/24/2015  WBC 3.6 - 11.0 K/uL 6.1 5.7  Hemoglobin 12.0 - 16.0 g/dL 12.1 12.5  Hematocrit 35.0 - 47.0 % 35.6 37.4  Platelets 150 - 440 K/uL 333 353    Appointment on 12/23/2015  Component Date Value Ref Range Status  . WBC 12/23/2015 6.1  3.6 - 11.0 K/uL Final  . RBC 12/23/2015 4.27  3.80 - 5.20 MIL/uL Final  . Hemoglobin 12/23/2015 12.1  12.0 - 16.0 g/dL Final  . HCT 12/23/2015 35.6   35.0 - 47.0 % Final  . MCV 12/23/2015 83.2  80.0 - 100.0 fL Final  . MCH 12/23/2015 28.4  26.0 - 34.0 pg Final  . MCHC 12/23/2015 34.2  32.0 - 36.0 g/dL Final  . RDW 12/23/2015 15.1* 11.5 - 14.5 % Final  . Platelets 12/23/2015 333  150 - 440 K/uL Final  . Neutrophils Relative % 12/23/2015 54   Final  . Neutro Abs 12/23/2015 3.4  1.4 - 6.5 K/uL Final  . Lymphocytes Relative 12/23/2015 33   Final  . Lymphs Abs 12/23/2015 2.0  1.0 - 3.6 K/uL Final  . Monocytes Relative 12/23/2015 6   Final  . Monocytes Absolute 12/23/2015 0.4  0.2 - 0.9 K/uL Final  . Eosinophils Relative 12/23/2015 6   Final  . Eosinophils Absolute 12/23/2015 0.3  0 - 0.7 K/uL Final  . Basophils Relative 12/23/2015 1   Final  . Basophils Absolute 12/23/2015 0.0  0 - 0.1 K/uL Final  . Sodium 12/23/2015 137  135 - 145 mmol/L Final  . Potassium 12/23/2015 5.0  3.5 - 5.1 mmol/L Final  . Chloride 12/23/2015 106  101 - 111 mmol/L Final  . CO2 12/23/2015 27  22 - 32 mmol/L Final  . Glucose, Bld 12/23/2015 98  65 - 99 mg/dL Final  . BUN 12/23/2015 20  6 - 20 mg/dL Final  . Creatinine, Ser 12/23/2015 0.86  0.44 - 1.00 mg/dL Final  . Calcium 12/23/2015 9.4  8.9 - 10.3 mg/dL Final  . Total Protein 12/23/2015 7.4  6.5 - 8.1 g/dL Final  . Albumin 12/23/2015 4.4  3.5 - 5.0 g/dL Final  . AST 12/23/2015 25  15 - 41 U/L Final  . ALT 12/23/2015 20  14 - 54 U/L Final  . Alkaline Phosphatase 12/23/2015 52  38 - 126 U/L Final  . Total Bilirubin 12/23/2015 0.6  0.3 - 1.2 mg/dL Final  . GFR calc non Af Amer 12/23/2015 >60  >60 mL/min Final  . GFR calc Af Amer 12/23/2015 >60  >60 mL/min Final   Comment: (NOTE) The eGFR has been calculated using the CKD EPI equation. This calculation has not been validated in all clinical situations. eGFR's persistently <60 mL/min signify possible Chronic Kidney Disease.   . Anion gap 12/23/2015 4* 5 - 15 Final      Ref Range 6d ago  39moago  136yrgo     CA 27.29 0.0 - 38.6 U/mL 24.9 30.5CM  23.4R, CM         ASSESSMENT: Carcinoma breast status post chemotherapy status post bilateral mastectomy with reconstructive surgery On clinical examination and review of lab data there is no evidence of recurrent disease Continue letrozole calcium  and vitamin D.  We will try to get information about her bone density study which has been done at primary care physician's office.   On clinical examination there is no evidence of recurrent disease Lab data and tumor markers have been reviewed  Because of my planned retirement patient will be followed by my associate [place]  guidelines for follow-up in breast cancer patient History and physical 1-4 times per year as clinically indicated for first 5 years Periodic screening for changes in the family history and referable to genetic counseling is necessary.  Educated monitor and refer patient for lymphedema management if needed Routine imaging of reconstructed breasts is not indicated In absence of clinical signs and symptoms suggestive of recurrent disease there is no indication for lab or imaging studies for metastatic screening Recommend on tamoxifen needs annual GYN evaluation if uterus is present.  Patient on aromatase inhibitor needs bone density study for evaluation regarding osteoporosis Evidence suggests that active lifestyle healthy diet limited alcohol intake and achieving i and maintaining ideal body weight may lead to optimal breast cancer outcome  Patient expressed understanding and was in agreement with this plan. She also understands that She can call clinic at any time with any questions, concerns, or complaints.    Cancer of lower-outer quadrant of female breast [facility])   Staging form: Breast, AJCC 7th Edition     Pathologic: ypT1c,ypN2a, MX - Signed by Haywood Lasso, MD on 03/10/2012   Forest Gleason, MD   12/23/2015 10:29 AM

## 2015-12-24 LAB — CANCER ANTIGEN 27.29: CA 27.29: 24.9 U/mL (ref 0.0–38.6)

## 2015-12-29 ENCOUNTER — Encounter: Payer: Self-pay | Admitting: Oncology

## 2016-01-31 ENCOUNTER — Other Ambulatory Visit: Payer: Self-pay | Admitting: Oncology

## 2016-06-15 ENCOUNTER — Other Ambulatory Visit: Payer: Self-pay

## 2016-06-15 DIAGNOSIS — C50519 Malignant neoplasm of lower-outer quadrant of unspecified female breast: Secondary | ICD-10-CM

## 2016-06-22 ENCOUNTER — Ambulatory Visit: Payer: BLUE CROSS/BLUE SHIELD | Admitting: Internal Medicine

## 2016-06-22 ENCOUNTER — Inpatient Hospital Stay: Payer: BLUE CROSS/BLUE SHIELD

## 2016-07-01 ENCOUNTER — Ambulatory Visit
Admission: RE | Admit: 2016-07-01 | Discharge: 2016-07-01 | Disposition: A | Discharge status: Home / Self Care | Payer: BLUE CROSS/BLUE SHIELD | Source: Ambulatory Visit | Attending: Radiation Oncology | Admitting: Radiation Oncology

## 2016-07-01 ENCOUNTER — Encounter (INDEPENDENT_AMBULATORY_CARE_PROVIDER_SITE_OTHER): Payer: Self-pay

## 2016-07-01 ENCOUNTER — Encounter: Payer: Self-pay | Admitting: Radiation Oncology

## 2016-07-01 VITALS — BP 138/88 | HR 84 | Temp 97.5°F | Resp 20 | Wt 182.7 lb

## 2016-07-01 DIAGNOSIS — Z923 Personal history of irradiation: Secondary | ICD-10-CM | POA: Diagnosis not present

## 2016-07-01 DIAGNOSIS — Z170 Estrogen receptor positive status [ER+]: Secondary | ICD-10-CM | POA: Insufficient documentation

## 2016-07-01 DIAGNOSIS — Z9013 Acquired absence of bilateral breasts and nipples: Secondary | ICD-10-CM | POA: Diagnosis not present

## 2016-07-01 DIAGNOSIS — C50911 Malignant neoplasm of unspecified site of right female breast: Secondary | ICD-10-CM | POA: Diagnosis present

## 2016-07-01 DIAGNOSIS — C50912 Malignant neoplasm of unspecified site of left female breast: Secondary | ICD-10-CM

## 2016-07-01 DIAGNOSIS — Z79811 Long term (current) use of aromatase inhibitors: Secondary | ICD-10-CM | POA: Insufficient documentation

## 2016-07-01 NOTE — Progress Notes (Signed)
Radiation Oncology Follow up Note  Name: Kimberly [name]   Date:   07/01/2016 MRN:  [id number] DOB: 22-Jul-1956    This 60 y.o. female presents to the clinic today for close to 5 year follow-up for radiation therapy to right breast status post modified radical mastectomy for a T2 N2 ER/PR positive HER-2/neu negative invasive mammary carcinoma and adjuvant radiation therapy.  REFERRING PROVIDER: Shepard General, MD  HPI: Patient is a 60 year old female now close to 5 years out having completed radiation therapy to her right breast after reconstruction for a T2 N2 M0 ER/PR positive HER-2/neu negative invasive mammary carcinoma status post new adjuvant chemotherapy than right modified radical mastectomy to the right any prophylactic mastectomy to the left. She had adjuvant radiation therapy to the right reconstructed breast. She is currently on letrozole following that well. She specifically denies breast tenderness cough or bone pain..  COMPLICATIONS OF TREATMENT: none  FOLLOW UP COMPLIANCE: keeps appointments   PHYSICAL EXAM:  BP 138/88   Pulse 84   Temp 97.5 F (36.4 C)   Resp 20   Wt 182 lb 10.4 oz (82.9 kg)   BMI 33.41 kg/m  Lungs are clear to A&P cardiac examination essentially unremarkable with regular rate and rhythm. No dominant mass or nodularity is noted in either breast in 2 positions examined. Incision is well-healed. No axillary or supraclavicular adenopathy is appreciated. Cosmetic result is excellent. Patient's had bilateral reconstruction with excellent cosmetic result. Well-developed well-nourished patient in NAD. HEENT reveals PERLA, EOMI, discs not visualized.  Oral cavity is clear. No oral mucosal lesions are identified. Neck is clear without evidence of cervical or supraclavicular adenopathy. Lungs are clear to A&P. Cardiac examination is essentially unremarkable with regular rate and rhythm without murmur rub or thrill. Abdomen is benign with no organomegaly or  masses noted. Motor sensory and DTR levels are equal and symmetric in the upper and lower extremities. Cranial nerves II through XII are grossly intact. Proprioception is intact. No peripheral adenopathy or edema is identified. No motor or sensory levels are noted. Crude visual fields are within normal range.  RADIOLOGY RESULTS: Based on breast reconstruction to she does not have follow-up mammograms  PLAN: Present time now close to 5 years out I'm going to discontinue follow-up care. I am please were overall progress. She continues to do well with no evidence of disease. Patient knows to call at anytime with any concerns.  I would like to take this opportunity to thank you for allowing me to participate in the care of your patient.Armstead Peaks., MD

## 2016-07-04 ENCOUNTER — Other Ambulatory Visit: Payer: Self-pay | Admitting: Physician Assistant

## 2016-07-04 DIAGNOSIS — C50519 Malignant neoplasm of lower-outer quadrant of unspecified female breast: Secondary | ICD-10-CM

## 2016-07-06 ENCOUNTER — Inpatient Hospital Stay: Payer: BLUE CROSS/BLUE SHIELD | Attending: Internal Medicine

## 2016-07-06 ENCOUNTER — Inpatient Hospital Stay (HOSPITAL_BASED_OUTPATIENT_CLINIC_OR_DEPARTMENT_OTHER): Payer: BLUE CROSS/BLUE SHIELD | Admitting: Internal Medicine

## 2016-07-06 VITALS — BP 165/98 | HR 80 | Temp 97.6°F | Resp 18 | Wt 184.7 lb

## 2016-07-06 DIAGNOSIS — I10 Essential (primary) hypertension: Secondary | ICD-10-CM | POA: Diagnosis not present

## 2016-07-06 DIAGNOSIS — E7800 Pure hypercholesterolemia, unspecified: Secondary | ICD-10-CM

## 2016-07-06 DIAGNOSIS — Z794 Long term (current) use of insulin: Secondary | ICD-10-CM | POA: Insufficient documentation

## 2016-07-06 DIAGNOSIS — E119 Type 2 diabetes mellitus without complications: Secondary | ICD-10-CM

## 2016-07-06 DIAGNOSIS — Z803 Family history of malignant neoplasm of breast: Secondary | ICD-10-CM | POA: Insufficient documentation

## 2016-07-06 DIAGNOSIS — C50511 Malignant neoplasm of lower-outer quadrant of right female breast: Secondary | ICD-10-CM | POA: Diagnosis present

## 2016-07-06 DIAGNOSIS — Z79811 Long term (current) use of aromatase inhibitors: Secondary | ICD-10-CM | POA: Diagnosis not present

## 2016-07-06 DIAGNOSIS — Z9013 Acquired absence of bilateral breasts and nipples: Secondary | ICD-10-CM | POA: Diagnosis not present

## 2016-07-06 DIAGNOSIS — Z923 Personal history of irradiation: Secondary | ICD-10-CM | POA: Insufficient documentation

## 2016-07-06 DIAGNOSIS — C50519 Malignant neoplasm of lower-outer quadrant of unspecified female breast: Secondary | ICD-10-CM

## 2016-07-06 DIAGNOSIS — Z79899 Other long term (current) drug therapy: Secondary | ICD-10-CM | POA: Insufficient documentation

## 2016-07-06 DIAGNOSIS — Z9221 Personal history of antineoplastic chemotherapy: Secondary | ICD-10-CM | POA: Insufficient documentation

## 2016-07-06 DIAGNOSIS — Z170 Estrogen receptor positive status [ER+]: Secondary | ICD-10-CM | POA: Diagnosis not present

## 2016-07-06 LAB — CBC WITH DIFFERENTIAL/PLATELET
BASOS ABS: 0.1 10*3/uL (ref 0–0.1)
BASOS PCT: 1 %
EOS PCT: 5 %
Eosinophils Absolute: 0.3 10*3/uL (ref 0–0.7)
HEMATOCRIT: 36.6 % (ref 35.0–47.0)
Hemoglobin: 12.1 g/dL (ref 12.0–16.0)
Lymphocytes Relative: 33 %
Lymphs Abs: 2.1 10*3/uL (ref 1.0–3.6)
MCH: 27.4 pg (ref 26.0–34.0)
MCHC: 33 g/dL (ref 32.0–36.0)
MCV: 83 fL (ref 80.0–100.0)
MONO ABS: 0.3 10*3/uL (ref 0.2–0.9)
Monocytes Relative: 5 %
NEUTROS ABS: 3.5 10*3/uL (ref 1.4–6.5)
Neutrophils Relative %: 56 %
PLATELETS: 353 10*3/uL (ref 150–440)
RBC: 4.41 MIL/uL (ref 3.80–5.20)
RDW: 14.7 % — AB (ref 11.5–14.5)
WBC: 6.3 10*3/uL (ref 3.6–11.0)

## 2016-07-06 LAB — COMPREHENSIVE METABOLIC PANEL
ALBUMIN: 4 g/dL (ref 3.5–5.0)
ALK PHOS: 68 U/L (ref 38–126)
ALT: 19 U/L (ref 14–54)
AST: 19 U/L (ref 15–41)
Anion gap: 8 (ref 5–15)
BILIRUBIN TOTAL: 0.4 mg/dL (ref 0.3–1.2)
BUN: 13 mg/dL (ref 6–20)
CALCIUM: 9 mg/dL (ref 8.9–10.3)
CO2: 24 mmol/L (ref 22–32)
CREATININE: 0.8 mg/dL (ref 0.44–1.00)
Chloride: 103 mmol/L (ref 101–111)
GFR calc Af Amer: >60 mL/min (ref >60)
GLUCOSE: 212 mg/dL — AB (ref 65–99)
Potassium: 4.6 mmol/L (ref 3.5–5.1)
Sodium: 135 mmol/L (ref 135–145)
TOTAL PROTEIN: 7.2 g/dL (ref 6.5–8.1)

## 2016-07-06 MED ORDER — LETROZOLE 2.5 MG PO TABS: ORAL_TABLET | ORAL | 4 refills | Status: DC

## 2016-07-06 NOTE — Progress Notes (Signed)
[place] OFFICE PROGRESS NOTE  Patient Care Team: Shepard General, MD as PCP - General (Unknown Physician Specialty) Neldon Mc, MD (General Surgery) Everlene Farrier, MD (Obstetrics and Gynecology) Noreene Filbert, MD Forest Gleason, MD (Unknown Physician Specialty)  Carcinoma of lower-outer quadrant of right breast in female, estrogen receptor positive [facility])   Staging form: Breast, AJCC 7th Edition   - Pathologic: ypT1c,ypN2a, MX - Signed by Haywood Lasso, MD on 03/10/2012   Oncology History   # DEC 2012- RIGHT BREAST CA T2 N2 M0 tumor from biopsy.  Estrogen receptor positive, Progesterone receptor positive.  Current receptor negative by FISH 2. Neoadjuvant chemotherapy started in December of 28 with Cytoxan Adriamycin 3. Started on Taxol weekly chemotherapy. 4. Patient finished 12  cycles of Taxol chemotherapy in May of 2013.     5. Status post right modified radical mastectomy [Dr.Bowers; [place]] June of 2013, ypT1c  yp N2  MO. started also on Lerazole    7. Radiation therapy to the right breast (September of 2013).  Lymph node was positive for HER-2 receptor gene amplification of 2.22.  Will proceed with Herceptin treatment starting in September of 2013.   8.Patient has finished Herceptin (maintenance therapy) in August of 2014 8. Start patient on letrozole from November, 2013. 9. Patient started on Herceptin in September 2013.   10Patient finished 12 months of Herceptin therapy on August, 2014  # 6. Status post left side prophylactic mastectomy.     Carcinoma of lower-outer quadrant of right breast in female, estrogen receptor positive ([place])   08/06/2011 Initial Diagnosis    Cancer of lower-outer quadrant of female breast [facility])       This is my first interaction with the patient as patient's primary oncologist has been Dr.Choksi. I reviewed the patient's prior charts/pertinent labs/imaging in detail; findings are summarized above.     INTERVAL  HISTORY:  Kimberly [name] 60 y.o.  female pleasant patient above history of Breast cancer is here for follow-up. She denies any new lumps or bumps. Appetite is good. No bone pain. No shortness of breath or cough.  REVIEW OF SYSTEMS:  A complete 10 point review of system is done which is negative except mentioned above/history of present illness.   PAST MEDICAL HISTORY :  Past Medical History:  Diagnosis Date  . Cancer of lower-outer quadrant of female breast ([place]) 08/06/2011  . Diabetes mellitus ([place]) 03/24/2012  . High cholesterol   . Hypertension   . PONV (postoperative nausea and vomiting)   . Type II diabetes mellitus (HCC)    fasting 140-150    PAST SURGICAL HISTORY :   Past Surgical History:  Procedure Laterality Date  . BREAST BIOPSY  07/2011   right  . BREAST RECONSTRUCTION  03/07/2012   Procedure: BREAST RECONSTRUCTION;  Surgeon: Crissie Reese, MD;  Location: [place];  Service: Plastics;  Laterality: Left;  BREAST RECONSTRUCTION WITH PLACEMENT OF TISSUE EXPANDER TO LEFT BREAST  . BREAST RECONSTRUCTION  02/06/2013  . CESAREAN SECTION  [id number]  . LATISSIMUS FLAP TO BREAST Right 02/06/2013   Procedure: LATISSIMUS FLAP TO RIGHT BREAST WITH IMPLANT;  Surgeon: Crissie Reese, MD;  Location: [place];  Service: Plastics;  Laterality: Right;  . MASTECTOMY  03/07/12   modified right; total left  . MODIFIED MASTECTOMY  03/07/2012   Procedure: MODIFIED MASTECTOMY;  Surgeon: Haywood Lasso, MD;  Location: [place];  Service: General;  Laterality: Right;  . PORTACATH PLACEMENT  07/2011  . SCAR REVISION  03/30/2012   Procedure: SCAR REVISION;  Surgeon: Haywood Lasso, MD;  Location: [place];  Service: General;  Laterality: Right;  CLOSURE OF MASTECTOMY INCISION  . WISDOM TOOTH EXTRACTION      FAMILY HISTORY :   Family History  Problem Relation Age of Onset  . Cancer Mother     breast    SOCIAL HISTORY:   Social History  Substance Use Topics  . Smoking status:  Never Smoker  . Smokeless tobacco: Never Used  . Alcohol use No    ALLERGIES:  is allergic to ciprofloxacin and sulfa antibiotics.  MEDICATIONS:  Current Outpatient Prescriptions  Medication Sig Dispense Refill  . atorvastatin (LIPITOR) 20 MG tablet Take 20 mg by mouth daily.  4  . Calcium Carbonate-Vitamin D (CALCIUM 600 + D PO) Take 1 tablet by mouth 2 (two) times daily.    . insulin glargine (LANTUS) 100 UNIT/ML injection Inject 38-40 Units into the skin at bedtime.    Marland Kitchen KOMBIGLYZE XR 2.12-998 MG TB24 Take 1 tablet by mouth 2 (two) times daily.  99  . letrozole (FEMARA) 2.5 MG tablet TAKE 1 TABLET (2.5 MG TOTAL) BY MOUTH DAILY. 90 tablet 4  . lisinopril (PRINIVIL,ZESTRIL) 10 MG tablet Take 10 mg by mouth daily.     No current facility-administered medications for this visit.     PHYSICAL EXAMINATION: ECOG PERFORMANCE STATUS: 0 - Asymptomatic  BP (!) 165/98 (BP Location: Right Arm, Patient Position: Sitting)   Pulse 80   Temp 97.6 F (36.4 C) (Tympanic)   Resp 18   Wt 184 lb 11.9 oz (83.8 kg)   BMI 33.79 kg/m   Filed Weights   07/06/16 1108  Weight: 184 lb 11.9 oz (83.8 kg)    GENERAL: Well-nourished well-developed; Alert, no distress and comfortable.   Alone.  EYES: no pallor or icterus OROPHARYNX: no thrush or ulceration; good dentition  NECK: supple, no masses felt LYMPH:  no palpable lymphadenopathy in the cervical, axillary or inguinal regions LUNGS: clear to auscultation and  No wheeze or crackles HEART/CVS: regular rate & rhythm and no murmurs; No lower extremity edema ABDOMEN:abdomen soft, non-tender and normal bowel sounds Musculoskeletal:no cyanosis of digits and no clubbing  PSYCH: alert & oriented x 3 with fluent speech NEURO: no focal motor/sensory deficits SKIN:  no rashes or significant lesions  LABORATORY DATA:  I have reviewed the data as listed    Component Value Date/Time   NA 135 07/06/2016 1021   NA 135 11/28/2014 1401   K 4.6 07/06/2016  1021   K 4.7 11/28/2014 1401   CL 103 07/06/2016 1021   CL 99 (L) 11/28/2014 1401   CO2 24 07/06/2016 1021   CO2 28 11/28/2014 1401   GLUCOSE 212 (H) 07/06/2016 1021   GLUCOSE 215 (H) 11/28/2014 1401   BUN 13 07/06/2016 1021   BUN 16 11/28/2014 1401   CREATININE 0.80 07/06/2016 1021   CREATININE 0.96 11/28/2014 1401   CALCIUM 9.0 07/06/2016 1021   CALCIUM 9.9 11/28/2014 1401   PROT 7.2 07/06/2016 1021   PROT 7.6 11/28/2014 1401   ALBUMIN 4.0 07/06/2016 1021   ALBUMIN 4.5 11/28/2014 1401   AST 19 07/06/2016 1021   AST 26 11/28/2014 1401   ALT 19 07/06/2016 1021   ALT 28 11/28/2014 1401   ALKPHOS 68 07/06/2016 1021   ALKPHOS 78 11/28/2014 1401   BILITOT 0.4 07/06/2016 1021   BILITOT 0.5 11/28/2014 1401   GFRNONAA >60 07/06/2016 1021   GFRNONAA >  60 11/28/2014 1401   GFRAA >60 07/06/2016 1021   GFRAA >60 11/28/2014 1401    No results found for: SPEP, UPEP  Lab Results  Component Value Date   WBC 6.3 07/06/2016   NEUTROABS 3.5 07/06/2016   HGB 12.1 07/06/2016   HCT 36.6 07/06/2016   MCV 83.0 07/06/2016   PLT 353 07/06/2016      Chemistry      Component Value Date/Time   NA 135 07/06/2016 1021   NA 135 11/28/2014 1401   K 4.6 07/06/2016 1021   K 4.7 11/28/2014 1401   CL 103 07/06/2016 1021   CL 99 (L) 11/28/2014 1401   CO2 24 07/06/2016 1021   CO2 28 11/28/2014 1401   BUN 13 07/06/2016 1021   BUN 16 11/28/2014 1401   CREATININE 0.80 07/06/2016 1021   CREATININE 0.96 11/28/2014 1401      Component Value Date/Time   CALCIUM 9.0 07/06/2016 1021   CALCIUM 9.9 11/28/2014 1401   ALKPHOS 68 07/06/2016 1021   ALKPHOS 78 11/28/2014 1401   AST 19 07/06/2016 1021   AST 26 11/28/2014 1401   ALT 19 07/06/2016 1021   ALT 28 11/28/2014 1401   BILITOT 0.4 07/06/2016 1021   BILITOT 0.5 11/28/2014 1401       RADIOGRAPHIC STUDIES: I have personally reviewed the radiological images as listed and agreed with the findings in the report. No results found.    ASSESSMENT & PLAN:  Carcinoma of lower-outer quadrant of right breast in female, estrogen receptor positive ([place]) # STAGE II BREAST RIGHT CA- on adjuvant letrozole. Clinically no evidence of recurrence. Tolerating letrozole very well. Given the high risk breast cancer I would recommend- extended adjuvant therapy for 10 years.   # BMD- 2016- normal as per patient.; Awaiting a repeat bone density in January 2018 through her gynecologist.  # plan follow up 6 months/labs.    Orders Placed This Encounter  Procedures  . CBC with Differential    Standing Status:   Future    Standing Expiration Date:   07/06/2017  . Comprehensive metabolic panel    Standing Status:   Future    Standing Expiration Date:   07/06/2017  . Cancer antigen 27.29    Standing Status:   Future    Standing Expiration Date:   07/06/2017   All questions were answered. The patient knows to call the clinic with any problems, questions or concerns.      [name], MD 07/06/2016 5:47 PM

## 2016-07-06 NOTE — Assessment & Plan Note (Addendum)
#   STAGE II BREAST RIGHT CA- on adjuvant letrozole. Clinically no evidence of recurrence. Tolerating letrozole very well. Given the high risk breast cancer I would recommend- extended adjuvant therapy for 10 years.   # BMD- 2016- normal as per patient.; Awaiting a repeat bone density in January 2018 through her gynecologist.  # plan follow up 6 months/labs.

## 2016-07-06 NOTE — Progress Notes (Signed)
90 days Refill for letrozole approved by Dr. Rogue Bussing. rx sent to patient' pharmacy

## 2016-07-06 NOTE — Progress Notes (Signed)
Patient is here for follow up, she needs a refill on her Letrazole. She would like a 90 day supply

## 2016-07-07 LAB — CANCER ANTIGEN 27.29: CA 27.29: 30 U/mL (ref 0.0–38.6)

## 2016-07-09 ENCOUNTER — Other Ambulatory Visit: Payer: Self-pay | Admitting: Physician Assistant

## 2016-07-09 DIAGNOSIS — C50519 Malignant neoplasm of lower-outer quadrant of unspecified female breast: Secondary | ICD-10-CM

## 2017-01-04 ENCOUNTER — Inpatient Hospital Stay (HOSPITAL_BASED_OUTPATIENT_CLINIC_OR_DEPARTMENT_OTHER): Payer: BLUE CROSS/BLUE SHIELD | Admitting: Internal Medicine

## 2017-01-04 ENCOUNTER — Inpatient Hospital Stay: Payer: BLUE CROSS/BLUE SHIELD | Attending: Internal Medicine

## 2017-01-04 VITALS — BP 149/86 | HR 83 | Temp 97.5°F | Resp 16 | Wt 185.6 lb

## 2017-01-04 DIAGNOSIS — E119 Type 2 diabetes mellitus without complications: Secondary | ICD-10-CM | POA: Insufficient documentation

## 2017-01-04 DIAGNOSIS — Z9011 Acquired absence of right breast and nipple: Secondary | ICD-10-CM | POA: Insufficient documentation

## 2017-01-04 DIAGNOSIS — E7800 Pure hypercholesterolemia, unspecified: Secondary | ICD-10-CM | POA: Insufficient documentation

## 2017-01-04 DIAGNOSIS — I10 Essential (primary) hypertension: Secondary | ICD-10-CM | POA: Diagnosis not present

## 2017-01-04 DIAGNOSIS — Z170 Estrogen receptor positive status [ER+]: Secondary | ICD-10-CM | POA: Insufficient documentation

## 2017-01-04 DIAGNOSIS — Z803 Family history of malignant neoplasm of breast: Secondary | ICD-10-CM | POA: Diagnosis not present

## 2017-01-04 DIAGNOSIS — Z79811 Long term (current) use of aromatase inhibitors: Secondary | ICD-10-CM | POA: Diagnosis not present

## 2017-01-04 DIAGNOSIS — Z923 Personal history of irradiation: Secondary | ICD-10-CM | POA: Diagnosis not present

## 2017-01-04 DIAGNOSIS — Z79899 Other long term (current) drug therapy: Secondary | ICD-10-CM

## 2017-01-04 DIAGNOSIS — C50511 Malignant neoplasm of lower-outer quadrant of right female breast: Secondary | ICD-10-CM

## 2017-01-04 DIAGNOSIS — Z9221 Personal history of antineoplastic chemotherapy: Secondary | ICD-10-CM | POA: Insufficient documentation

## 2017-01-04 LAB — CBC WITH DIFFERENTIAL/PLATELET
BASOS PCT: 1 %
Basophils Absolute: 0.1 10*3/uL (ref 0–0.1)
EOS ABS: 0.1 10*3/uL (ref 0–0.7)
EOS PCT: 3 %
HCT: 36.4 % (ref 35.0–47.0)
HEMOGLOBIN: 12.1 g/dL (ref 12.0–16.0)
Lymphocytes Relative: 43 %
Lymphs Abs: 2 10*3/uL (ref 1.0–3.6)
MCH: 28.2 pg (ref 26.0–34.0)
MCHC: 33.3 g/dL (ref 32.0–36.0)
MCV: 84.7 fL (ref 80.0–100.0)
MONO ABS: 0.4 10*3/uL (ref 0.2–0.9)
MONOS PCT: 8 %
NEUTROS ABS: 2.1 10*3/uL (ref 1.4–6.5)
NEUTROS PCT: 45 %
PLATELETS: 326 10*3/uL (ref 150–440)
RBC: 4.29 MIL/uL (ref 3.80–5.20)
RDW: 14.7 % — AB (ref 11.5–14.5)
WBC: 4.7 10*3/uL (ref 3.6–11.0)

## 2017-01-04 LAB — COMPREHENSIVE METABOLIC PANEL
ALBUMIN: 3.8 g/dL (ref 3.5–5.0)
ALT: 23 U/L (ref 14–54)
AST: 26 U/L (ref 15–41)
Alkaline Phosphatase: 70 U/L (ref 38–126)
Anion gap: 8 (ref 5–15)
BILIRUBIN TOTAL: 0.4 mg/dL (ref 0.3–1.2)
BUN: 14 mg/dL (ref 6–20)
CHLORIDE: 100 mmol/L — AB (ref 101–111)
CO2: 24 mmol/L (ref 22–32)
CREATININE: 0.85 mg/dL (ref 0.44–1.00)
Calcium: 9.5 mg/dL (ref 8.9–10.3)
GFR calc Af Amer: >60 mL/min (ref >60)
GFR calc non Af Amer: >60 mL/min (ref >60)
GLUCOSE: 207 mg/dL — AB (ref 65–99)
Potassium: 4.6 mmol/L (ref 3.5–5.1)
Sodium: 132 mmol/L — ABNORMAL LOW (ref 135–145)
Total Protein: 7.2 g/dL (ref 6.5–8.1)

## 2017-01-04 NOTE — Progress Notes (Signed)
Patient here today for follow up.   

## 2017-01-04 NOTE — Assessment & Plan Note (Addendum)
#   STAGE II BREAST RIGHT CA- on adjuvant letrozole. Clinically no evidence of recurrence. Tolerating letrozole very well. Given the high risk breast cancer I would recommend- extended adjuvant therapy for 10 years. Patient tolerating well.  # BMD- Jan 2018- [osteopenia- T= score -2.1]. Continue calcium and vitamin D; discussed regarding weightbearing exercise. Also discussed regarding Fosamax/Boniva and Prolia if continues getting worse.   # plan follow up 6 months/labs.   CC: Dr.Rabinowitz

## 2017-01-04 NOTE — Progress Notes (Signed)
[place] OFFICE PROGRESS NOTE  Patient Care Team: Shepard General, MD as PCP - General (Unknown Physician Specialty) Neldon Mc, MD (General Surgery) Everlene Farrier, MD (Obstetrics and Gynecology) Noreene Filbert, MD Forest Gleason, MD (Unknown Physician Specialty)  Cancer Staging Carcinoma of lower-outer quadrant of right breast in female, estrogen receptor positive [facility]) Staging form: Breast, AJCC 7th Edition - Pathologic: ypT1c,ypN2a, MX - Signed by Haywood Lasso, MD on 03/10/2012    Oncology History   # DEC 2012- RIGHT BREAST CA T2 N2 M0 tumor from biopsy.  Estrogen receptor positive, Progesterone receptor positive.  Current receptor negative by FISH 2. Neoadjuvant chemotherapy started in December of 28 with Cytoxan Adriamycin 3. Started on Taxol weekly chemotherapy. 4. Patient finished 12  cycles of Taxol chemotherapy in May of 2013.     5. Status post right modified radical mastectomy [Dr.Bowers; [place]] June of 2013, ypT1c  yp N2  MO. started also on Lerazole    7. Radiation therapy to the right breast (September of 2013).  Lymph node was positive for HER-2 receptor gene amplification of 2.22.  Will proceed with Herceptin treatment starting in September of 2013.   8.Patient has finished Herceptin (maintenance therapy) in August of 2014 8. Start patient on letrozole from November, 2013. 9. Patient started on Herceptin in September 2013.   10Patient finished 12 months of Herceptin therapy on August, 2014  # 6. Status post left side prophylactic mastectomy.     Carcinoma of lower-outer quadrant of right breast in female, estrogen receptor positive ([place])    INTERVAL HISTORY:  Kimberly [name] 61 y.o.  female pleasant patient above history of Breast cancer is here for follow-up. She denies any new lumps or bumps. Appetite is good. No bone pain. No shortness of breath or cough. No headaches. No vision changes.  REVIEW OF SYSTEMS:  A complete  10 point review of system is done which is negative except mentioned above/history of present illness.   PAST MEDICAL HISTORY :  Past Medical History:  Diagnosis Date  . Cancer of lower-outer quadrant of female breast ([place]) 08/06/2011  . Diabetes mellitus ([place]) 03/24/2012  . High cholesterol   . Hypertension   . PONV (postoperative nausea and vomiting)   . Type II diabetes mellitus ([facility])    fasting 140-150    PAST SURGICAL HISTORY :   Past Surgical History:  Procedure Laterality Date  . BREAST BIOPSY  07/2011   right  . BREAST RECONSTRUCTION  03/07/2012   Procedure: BREAST RECONSTRUCTION;  Surgeon: Crissie Reese, MD;  Location: [place];  Service: Plastics;  Laterality: Left;  BREAST RECONSTRUCTION WITH PLACEMENT OF TISSUE EXPANDER TO LEFT BREAST  . BREAST RECONSTRUCTION  02/06/2013  . CESAREAN SECTION  [id number]  . LATISSIMUS FLAP TO BREAST Right 02/06/2013   Procedure: LATISSIMUS FLAP TO RIGHT BREAST WITH IMPLANT;  Surgeon: Crissie Reese, MD;  Location: [place];  Service: Plastics;  Laterality: Right;  . MASTECTOMY  03/07/12   modified right; total left  . MODIFIED MASTECTOMY  03/07/2012   Procedure: MODIFIED MASTECTOMY;  Surgeon: Haywood Lasso, MD;  Location: [place];  Service: General;  Laterality: Right;  . PORTACATH PLACEMENT  07/2011  . SCAR REVISION  03/30/2012   Procedure: SCAR REVISION;  Surgeon: Haywood Lasso, MD;  Location: [facility];  Service: General;  Laterality: Right;  CLOSURE OF MASTECTOMY INCISION  . WISDOM TOOTH EXTRACTION      FAMILY HISTORY :   Family History  Problem Relation Age of Onset  . Cancer Mother     breast    SOCIAL HISTORY:   Social History  Substance Use Topics  . Smoking status: Never Smoker  . Smokeless tobacco: Never Used  . Alcohol use No    ALLERGIES:  is allergic to ciprofloxacin and sulfa antibiotics.  MEDICATIONS:  Current Outpatient Prescriptions  Medication Sig Dispense Refill  . Calcium Carbonate-Vitamin D  (CALCIUM 600 + D PO) Take 1 tablet by mouth 2 (two) times daily.    . insulin glargine (LANTUS) 100 UNIT/ML injection Inject 38-40 Units into the skin at bedtime.    Marland Kitchen KOMBIGLYZE XR 2.12-998 MG TB24 Take 1 tablet by mouth 2 (two) times daily.  99  . letrozole (FEMARA) 2.5 MG tablet TAKE 1 TABLET (2.5 MG TOTAL) BY MOUTH DAILY. 90 tablet 4  . lisinopril (PRINIVIL,ZESTRIL) 10 MG tablet Take 10 mg by mouth daily.     No current facility-administered medications for this visit.     PHYSICAL EXAMINATION: ECOG PERFORMANCE STATUS: 0 - Asymptomatic  BP (!) 149/86 (BP Location: Right Arm, Patient Position: Sitting)   Pulse 83   Temp 97.5 F (36.4 C) (Tympanic)   Resp 16   Wt 185 lb 10 oz (84.2 kg)   BMI 33.95 kg/m   Filed Weights   01/04/17 0838  Weight: 185 lb 10 oz (84.2 kg)    GENERAL: Well-nourished well-developed; Alert, no distress and comfortable.   Accompanied by her husband. EYES: no pallor or icterus OROPHARYNX: no thrush or ulceration; good dentition  NECK: supple, no masses felt LYMPH:  no palpable lymphadenopathy in the cervical, axillary or inguinal regions LUNGS: clear to auscultation and  No wheeze or crackles HEART/CVS: regular rate & rhythm and no murmurs; No lower extremity edema ABDOMEN:abdomen soft, non-tender and normal bowel sounds Musculoskeletal:no cyanosis of digits and no clubbing  PSYCH: alert & oriented x 3 with fluent speech NEURO: no focal motor/sensory deficits SKIN:  no rashes or significant lesions Bilateral mastectomy incisions noted. Well-healing. No lumps or bumps noted.  LABORATORY DATA:  I have reviewed the data as listed    Component Value Date/Time   NA 132 (L) 01/04/2017 0822   NA 135 11/28/2014 1401   K 4.6 01/04/2017 0822   K 4.7 11/28/2014 1401   CL 100 (L) 01/04/2017 0822   CL 99 (L) 11/28/2014 1401   CO2 24 01/04/2017 0822   CO2 28 11/28/2014 1401   GLUCOSE 207 (H) 01/04/2017 0822   GLUCOSE 215 (H) 11/28/2014 1401   BUN 14  01/04/2017 0822   BUN 16 11/28/2014 1401   CREATININE 0.85 01/04/2017 0822   CREATININE 0.96 11/28/2014 1401   CALCIUM 9.5 01/04/2017 0822   CALCIUM 9.9 11/28/2014 1401   PROT 7.2 01/04/2017 0822   PROT 7.6 11/28/2014 1401   ALBUMIN 3.8 01/04/2017 0822   ALBUMIN 4.5 11/28/2014 1401   AST 26 01/04/2017 0822   AST 26 11/28/2014 1401   ALT 23 01/04/2017 0822   ALT 28 11/28/2014 1401   ALKPHOS 70 01/04/2017 0822   ALKPHOS 78 11/28/2014 1401   BILITOT 0.4 01/04/2017 0822   BILITOT 0.5 11/28/2014 1401   GFRNONAA >60 01/04/2017 0822   GFRNONAA >60 11/28/2014 1401   GFRAA >60 01/04/2017 0822   GFRAA >60 11/28/2014 1401    No results found for: SPEP, UPEP  Lab Results  Component Value Date   WBC 4.7 01/04/2017   NEUTROABS 2.1 01/04/2017   HGB 12.1 01/04/2017  HCT 36.4 01/04/2017   MCV 84.7 01/04/2017   PLT 326 01/04/2017      Chemistry      Component Value Date/Time   NA 132 (L) 01/04/2017 0822   NA 135 11/28/2014 1401   K 4.6 01/04/2017 0822   K 4.7 11/28/2014 1401   CL 100 (L) 01/04/2017 0822   CL 99 (L) 11/28/2014 1401   CO2 24 01/04/2017 0822   CO2 28 11/28/2014 1401   BUN 14 01/04/2017 0822   BUN 16 11/28/2014 1401   CREATININE 0.85 01/04/2017 0822   CREATININE 0.96 11/28/2014 1401      Component Value Date/Time   CALCIUM 9.5 01/04/2017 0822   CALCIUM 9.9 11/28/2014 1401   ALKPHOS 70 01/04/2017 0822   ALKPHOS 78 11/28/2014 1401   AST 26 01/04/2017 0822   AST 26 11/28/2014 1401   ALT 23 01/04/2017 0822   ALT 28 11/28/2014 1401   BILITOT 0.4 01/04/2017 0822   BILITOT 0.5 11/28/2014 1401       RADIOGRAPHIC STUDIES: I have personally reviewed the radiological images as listed and agreed with the findings in the report. No results found.   ASSESSMENT & PLAN:  Carcinoma of lower-outer quadrant of right breast in female, estrogen receptor positive ([place]) # STAGE II BREAST RIGHT CA- on adjuvant letrozole. Clinically no evidence of recurrence. Tolerating  letrozole very well. Given the high risk breast cancer I would recommend- extended adjuvant therapy for 10 years. Patient tolerating well.  # BMD- Jan 2018- [osteopenia- T= score -2.1]. Continue calcium and vitamin D; discussed regarding weightbearing exercise. Also discussed regarding Fosamax/Boniva and Prolia if continues getting worse.   # plan follow up 6 months/labs.   CC: Dr.Rabinowitz   Orders Placed This Encounter  Procedures  . Cancer antigen 27.29    Standing Status:   Future    Standing Expiration Date:   01/04/2018  . CBC with Differential/Platelet    Standing Status:   Future    Standing Expiration Date:   01/04/2018  . Comprehensive metabolic panel    Standing Status:   Future    Standing Expiration Date:   01/04/2018   All questions were answered. The patient knows to call the clinic with any problems, questions or concerns.      [name], MD 01/05/2017 5:14 PM

## 2017-01-05 ENCOUNTER — Other Ambulatory Visit: Payer: Self-pay | Admitting: *Deleted

## 2017-01-05 DIAGNOSIS — Z170 Estrogen receptor positive status [ER+]: Principal | ICD-10-CM

## 2017-01-05 DIAGNOSIS — C50511 Malignant neoplasm of lower-outer quadrant of right female breast: Secondary | ICD-10-CM

## 2017-01-05 LAB — CANCER ANTIGEN 27.29: CA 27.29: 46.2 U/mL — AB (ref 0.0–38.6)

## 2017-01-06 ENCOUNTER — Encounter: Payer: Self-pay | Admitting: Obstetrics and Gynecology

## 2017-01-19 ENCOUNTER — Inpatient Hospital Stay: Payer: BLUE CROSS/BLUE SHIELD

## 2017-01-19 ENCOUNTER — Other Ambulatory Visit: Payer: BLUE CROSS/BLUE SHIELD

## 2017-01-19 DIAGNOSIS — Z170 Estrogen receptor positive status [ER+]: Principal | ICD-10-CM

## 2017-01-19 DIAGNOSIS — C50511 Malignant neoplasm of lower-outer quadrant of right female breast: Secondary | ICD-10-CM

## 2017-01-20 ENCOUNTER — Other Ambulatory Visit: Payer: Self-pay | Admitting: Internal Medicine

## 2017-01-20 DIAGNOSIS — Z170 Estrogen receptor positive status [ER+]: Principal | ICD-10-CM

## 2017-01-20 DIAGNOSIS — C50511 Malignant neoplasm of lower-outer quadrant of right female breast: Secondary | ICD-10-CM

## 2017-01-20 LAB — CANCER ANTIGEN 27.29: CA 27.29: 52.1 U/mL — ABNORMAL HIGH (ref 0.0–38.6)

## 2017-01-20 NOTE — Progress Notes (Signed)
Robin, please schedule pt CT chest abdom pelvis

## 2017-01-20 NOTE — Progress Notes (Signed)
Needs to be scheduled as soon as possible

## 2017-01-21 ENCOUNTER — Telehealth: Payer: Self-pay | Admitting: *Deleted

## 2017-01-21 ENCOUNTER — Telehealth: Payer: Self-pay | Admitting: Internal Medicine

## 2017-01-21 NOTE — Telephone Encounter (Signed)
Left message for pt to call back.  I cannot prescribe any anxiety meds- unless I get to talk to pt. Dr.B

## 2017-01-21 NOTE — Telephone Encounter (Signed)
Asking for a return call, she has questions. I see her CA 27-29 is elevated and she is scheduled for CT

## 2017-01-21 NOTE — Telephone Encounter (Signed)
Patient is having anxiety about her elevated tumor marker and is requesting some medication to help with the anxiety

## 2017-01-25 ENCOUNTER — Other Ambulatory Visit: Payer: Self-pay | Admitting: Internal Medicine

## 2017-01-25 ENCOUNTER — Telehealth: Payer: Self-pay | Admitting: Internal Medicine

## 2017-01-25 ENCOUNTER — Ambulatory Visit
Admission: RE | Admit: 2017-01-25 | Discharge: 2017-01-25 | Disposition: A | Discharge status: Home / Self Care | Payer: BLUE CROSS/BLUE SHIELD | Source: Ambulatory Visit | Attending: Internal Medicine | Admitting: Internal Medicine

## 2017-01-25 DIAGNOSIS — K5730 Diverticulosis of large intestine without perforation or abscess without bleeding: Secondary | ICD-10-CM | POA: Insufficient documentation

## 2017-01-25 DIAGNOSIS — R590 Localized enlarged lymph nodes: Secondary | ICD-10-CM

## 2017-01-25 DIAGNOSIS — C50511 Malignant neoplasm of lower-outer quadrant of right female breast: Secondary | ICD-10-CM

## 2017-01-25 DIAGNOSIS — K760 Fatty (change of) liver, not elsewhere classified: Secondary | ICD-10-CM | POA: Insufficient documentation

## 2017-01-25 DIAGNOSIS — N281 Cyst of kidney, acquired: Secondary | ICD-10-CM | POA: Insufficient documentation

## 2017-01-25 DIAGNOSIS — Z170 Estrogen receptor positive status [ER+]: Principal | ICD-10-CM

## 2017-01-25 MED ORDER — IOPAMIDOL (ISOVUE-300) INJECTION 61%
100.0000 mL | Freq: Once | INTRAVENOUS | Status: AC | PRN
  Administered 2017-01-25: 100 mL via INTRAVENOUS

## 2017-01-25 NOTE — Telephone Encounter (Signed)
Spoke to pt re: results of the CT scan- mediastinal LN. Needs PET ASAP. Also will be discussed at tumor conference on 5/31 re: Bx planning.   Please schedule PET asap; also will need follow up covering MD to discuss the Biopsy results.   Sheena/Anne- please call pt to re-assure; and review with her re: the plan of care.

## 2017-01-25 NOTE — Telephone Encounter (Signed)
Colette, please schedule pet scan and f/u with covering provider

## 2017-01-26 ENCOUNTER — Telehealth: Payer: Self-pay | Admitting: Internal Medicine

## 2017-01-26 NOTE — Telephone Encounter (Signed)
PET authorization code- [id number]-  ; until June 28th 2018

## 2017-01-27 ENCOUNTER — Telehealth: Payer: Self-pay | Admitting: Internal Medicine

## 2017-01-27 NOTE — Telephone Encounter (Signed)
Kimberly [name]/shawn- needs to be presented at the tumor conference on June 7th. This patient has a history of breast cancer/increasing tumor markers noted to have abnormal mediastinal lymph node/ needs biopsy planning. She is having her PET scan done on June 4th. Please make sure Biopsy is appropriately triaged/ and timely follow up with covering provider in my absence. Thanks.

## 2017-01-28 DIAGNOSIS — R42 Dizziness and giddiness: Secondary | ICD-10-CM

## 2017-01-28 HISTORY — DX: Dizziness and giddiness: R42

## 2017-01-28 NOTE — Addendum Note (Signed)
Addended by: Telford Nab on: 01/28/2017 10:06 AM   Modules accepted: Orders

## 2017-01-28 NOTE — Telephone Encounter (Signed)
Order entered to add pt to case conference on 6/7. Pt is scheduled to see Dr. Janese Banks on 6/7 at 1:45pm for follow up.

## 2017-01-31 ENCOUNTER — Ambulatory Visit
Admission: RE | Admit: 2017-01-31 | Discharge: 2017-01-31 | Disposition: A | Discharge status: Home / Self Care | Payer: BLUE CROSS/BLUE SHIELD | Source: Ambulatory Visit | Attending: Internal Medicine | Admitting: Internal Medicine

## 2017-01-31 DIAGNOSIS — K760 Fatty (change of) liver, not elsewhere classified: Secondary | ICD-10-CM | POA: Insufficient documentation

## 2017-01-31 DIAGNOSIS — R590 Localized enlarged lymph nodes: Secondary | ICD-10-CM | POA: Diagnosis present

## 2017-01-31 DIAGNOSIS — Z853 Personal history of malignant neoplasm of breast: Secondary | ICD-10-CM | POA: Insufficient documentation

## 2017-01-31 DIAGNOSIS — C7951 Secondary malignant neoplasm of bone: Secondary | ICD-10-CM | POA: Diagnosis not present

## 2017-01-31 LAB — GLUCOSE, CAPILLARY: GLUCOSE-CAPILLARY: 85 mg/dL (ref 65–99)

## 2017-01-31 MED ORDER — FLUDEOXYGLUCOSE F - 18 (FDG) INJECTION
13.2000 | Freq: Once | INTRAVENOUS | Status: AC | PRN
  Administered 2017-01-31: 13.2 via INTRAVENOUS

## 2017-02-03 ENCOUNTER — Encounter: Payer: Self-pay | Admitting: Oncology

## 2017-02-03 ENCOUNTER — Inpatient Hospital Stay: Payer: BLUE CROSS/BLUE SHIELD | Attending: Oncology | Admitting: Oncology

## 2017-02-03 VITALS — BP 145/85 | HR 102 | Temp 98.3°F | Resp 18 | Wt 182.4 lb

## 2017-02-03 DIAGNOSIS — Z79811 Long term (current) use of aromatase inhibitors: Secondary | ICD-10-CM | POA: Diagnosis not present

## 2017-02-03 DIAGNOSIS — Z9013 Acquired absence of bilateral breasts and nipples: Secondary | ICD-10-CM | POA: Diagnosis not present

## 2017-02-03 DIAGNOSIS — E7800 Pure hypercholesterolemia, unspecified: Secondary | ICD-10-CM | POA: Diagnosis not present

## 2017-02-03 DIAGNOSIS — R590 Localized enlarged lymph nodes: Secondary | ICD-10-CM | POA: Insufficient documentation

## 2017-02-03 DIAGNOSIS — Z79899 Other long term (current) drug therapy: Secondary | ICD-10-CM | POA: Insufficient documentation

## 2017-02-03 DIAGNOSIS — N281 Cyst of kidney, acquired: Secondary | ICD-10-CM | POA: Insufficient documentation

## 2017-02-03 DIAGNOSIS — K5730 Diverticulosis of large intestine without perforation or abscess without bleeding: Secondary | ICD-10-CM | POA: Diagnosis not present

## 2017-02-03 DIAGNOSIS — I10 Essential (primary) hypertension: Secondary | ICD-10-CM | POA: Insufficient documentation

## 2017-02-03 DIAGNOSIS — E119 Type 2 diabetes mellitus without complications: Secondary | ICD-10-CM | POA: Diagnosis not present

## 2017-02-03 DIAGNOSIS — K760 Fatty (change of) liver, not elsewhere classified: Secondary | ICD-10-CM | POA: Diagnosis not present

## 2017-02-03 DIAGNOSIS — C50511 Malignant neoplasm of lower-outer quadrant of right female breast: Secondary | ICD-10-CM | POA: Diagnosis not present

## 2017-02-03 DIAGNOSIS — Z794 Long term (current) use of insulin: Secondary | ICD-10-CM

## 2017-02-03 DIAGNOSIS — Z170 Estrogen receptor positive status [ER+]: Secondary | ICD-10-CM | POA: Diagnosis not present

## 2017-02-03 DIAGNOSIS — I700 Atherosclerosis of aorta: Secondary | ICD-10-CM | POA: Diagnosis not present

## 2017-02-03 DIAGNOSIS — Z923 Personal history of irradiation: Secondary | ICD-10-CM | POA: Diagnosis not present

## 2017-02-03 NOTE — Progress Notes (Signed)
Patient here today for PET results.  Patient of Dr. Jacinto Reap.

## 2017-02-03 NOTE — Telephone Encounter (Signed)
Pt discussed and it was determined she will need EBUS. Pt will follow up with med-onc after biopsy to review results. Referral entered for Kasa to see patient and schedule for EBUS.

## 2017-02-03 NOTE — Progress Notes (Signed)
Hematology/Oncology Consult note [facility]  Telephone:([phone] [phone]  Patient Care Team: Karen Kitchens, MD as PCP - General (Family Medicine) Neldon Mc, MD (General Surgery) Everlene Farrier, MD (Obstetrics and Gynecology) Noreene Filbert, MD Forest Gleason, MD (Unknown Physician Specialty)   Name of the patient: Kimberly [name]  [id number]  1955-10-30   Date of visit: 02/03/17  Diagnosis- h/o right breast cancer on letrozole  Chief complaint/ Reason for visit- discuss pet ct results  Heme/Onc history:  Oncology History   # DEC 2012- RIGHT BREAST CA T2 N2 M0 tumor from biopsy.  Estrogen receptor positive, Progesterone receptor positive.  Current receptor negative by FISH 2. Neoadjuvant chemotherapy started in December of 28 with Cytoxan Adriamycin 3. Started on Taxol weekly chemotherapy. 4. Patient finished 12  cycles of Taxol chemotherapy in May of 2013.     5. Status post right modified radical mastectomy [Dr.Bowers; [place]] June of 2013, ypT1c  yp N2  MO. started also on Lerazole    7. Radiation therapy to the right breast (September of 2013).  Lymph node was positive for HER-2 receptor gene amplification of 2.22.  Will proceed with Herceptin treatment starting in September of 2013.   8.Patient has finished Herceptin (maintenance therapy) in August of 2014 8. Start patient on letrozole from November, 2013. 9. Patient started on Herceptin in September 2013.   10Patient finished 12 months of Herceptin therapy on August, 2014  # 6. Status post left side prophylactic mastectomy.     Carcinoma of lower-outer quadrant of right breast in female, estrogen receptor positive ([place])    Patient currently on letrozole but noted to have rising tumor markers. She therefore underwent CT chest which showed mediastinal adenopathy upto 2 cm that was new. This was  Followed by PET/CT scan which has not been officially reported yet. A  PET CT scan was reviewed at the tumor Board today and she was found to have hypermetabolic mediastinal adenopathy but no evidence of hypermetabolism elsewhere  Interval history- Patient is anxious and tearful today. Otherwise reports doing well.  ECOG PS- 0 Pain scale- 0 Opioid associated constipation- no  Review of systems- Review of Systems  Constitutional: Negative for chills, fever, malaise/fatigue and weight loss.  HENT: Negative for congestion, ear discharge and nosebleeds.   Eyes: Negative for blurred vision.  Respiratory: Negative for cough, hemoptysis, sputum production, shortness of breath and wheezing.   Cardiovascular: Negative for chest pain, palpitations, orthopnea and claudication.  Gastrointestinal: Negative for abdominal pain, blood in stool, constipation, diarrhea, heartburn, melena, nausea and vomiting.  Genitourinary: Negative for dysuria, flank pain, frequency, hematuria and urgency.  Musculoskeletal: Negative for back pain, joint pain and myalgias.  Skin: Negative for rash.  Neurological: Negative for dizziness, tingling, focal weakness, seizures, weakness and headaches.  Endo/Heme/Allergies: Does not bruise/bleed easily.  Psychiatric/Behavioral: Negative for depression and suicidal ideas. The patient does not have insomnia.      Current treatment- letrozole  Allergies  Allergen Reactions  . Ciprofloxacin Nausea And Vomiting  . Sulfa Antibiotics Rash     Past Medical History:  Diagnosis Date  . Cancer of lower-outer quadrant of female breast ([place]) 08/06/2011  . Diabetes mellitus ([place]) 03/24/2012  . High cholesterol   . Hypertension   . PONV (postoperative nausea and vomiting)   . Type II diabetes mellitus ([facility])    fasting 140-150     Past Surgical History:  Procedure Laterality Date  . BREAST BIOPSY  07/2011  right  . BREAST RECONSTRUCTION  03/07/2012   Procedure: BREAST RECONSTRUCTION;  Surgeon: Crissie Reese, MD;  Location: [place];  Service:  Plastics;  Laterality: Left;  BREAST RECONSTRUCTION WITH PLACEMENT OF TISSUE EXPANDER TO LEFT BREAST  . BREAST RECONSTRUCTION  02/06/2013  . CESAREAN SECTION  [id number]  . LATISSIMUS FLAP TO BREAST Right 02/06/2013   Procedure: LATISSIMUS FLAP TO RIGHT BREAST WITH IMPLANT;  Surgeon: Crissie Reese, MD;  Location: [place];  Service: Plastics;  Laterality: Right;  . MASTECTOMY  03/07/12   modified right; total left  . MODIFIED MASTECTOMY  03/07/2012   Procedure: MODIFIED MASTECTOMY;  Surgeon: Haywood Lasso, MD;  Location: [place];  Service: General;  Laterality: Right;  . PORTACATH PLACEMENT  07/2011  . SCAR REVISION  03/30/2012   Procedure: SCAR REVISION;  Surgeon: Haywood Lasso, MD;  Location: [place];  Service: General;  Laterality: Right;  CLOSURE OF MASTECTOMY INCISION  . WISDOM TOOTH EXTRACTION      Social History   Social History  . Marital status: Married    Spouse name: N/A  . Number of children: N/A  . Years of education: N/A   Occupational History  . Not on file.   Social History Main Topics  . Smoking status: Never Smoker  . Smokeless tobacco: Never Used  . Alcohol use No  . Drug use: No  . Sexual activity: Yes   Other Topics Concern  . Not on file   Social History Narrative  . No narrative on file    Family History  Problem Relation Age of Onset  . Cancer Mother        breast     Current Outpatient Prescriptions:  .  Calcium Carbonate-Vitamin D (CALCIUM 600 + D PO), Take 1 tablet by mouth 2 (two) times daily., Disp: , Rfl:  .  insulin glargine (LANTUS) 100 UNIT/ML injection, Inject 38-40 Units into the skin at bedtime., Disp: , Rfl:  .  KOMBIGLYZE XR 2.12-998 MG TB24, Take 1 tablet by mouth 2 (two) times daily., Disp: , Rfl: 99 .  letrozole (FEMARA) 2.5 MG tablet, TAKE 1 TABLET (2.5 MG TOTAL) BY MOUTH DAILY., Disp: 90 tablet, Rfl: 4 .  lisinopril (PRINIVIL,ZESTRIL) 10 MG tablet, Take 10 mg by mouth daily., Disp: , Rfl:   Physical exam:    Vitals:   02/03/17 1345  BP: (!) 145/85  Pulse: (!) 102  Resp: 18  Temp: 98.3 F (36.8 C)  TempSrc: Tympanic  Weight: 182 lb 7 oz (82.8 kg)   Physical Exam  Constitutional: She is oriented to person, place, and time and well-developed, well-nourished, and in no distress.  HENT:  Head: Normocephalic and atraumatic.  Eyes: EOM are normal. Pupils are equal, round, and reactive to light.  Neck: Normal range of motion.  Cardiovascular: Normal rate, regular rhythm and normal heart sounds.   Pulmonary/Chest: Effort normal and breath sounds normal.  Abdominal: Soft. Bowel sounds are normal.  Neurological: She is alert and oriented to person, place, and time.  Skin: Skin is warm and dry.   patient is status post bilateral mastectomy without reconstruction. No evidence of chest wall recurrence  CMP Latest Ref Rng & Units 01/04/2017  Glucose 65 - 99 mg/dL 207(H)  BUN 6 - 20 mg/dL 14  Creatinine 0.44 - 1.00 mg/dL 0.85  Sodium 135 - 145 mmol/L 132(L)  Potassium 3.5 - 5.1 mmol/L 4.6  Chloride 101 - 111 mmol/L 100(L)  CO2 22 - 32 mmol/L 24  Calcium 8.9 - 10.3 mg/dL 9.5  Total Protein 6.5 - 8.1 g/dL 7.2  Total Bilirubin 0.3 - 1.2 mg/dL 0.4  Alkaline Phos 38 - 126 U/L 70  AST 15 - 41 U/L 26  ALT 14 - 54 U/L 23   CBC Latest Ref Rng & Units 01/04/2017  WBC 3.6 - 11.0 K/uL 4.7  Hemoglobin 12.0 - 16.0 g/dL 12.1  Hematocrit 35.0 - 47.0 % 36.4  Platelets 150 - 440 K/uL 326    No images are attached to the encounter.  Ct Chest W Contrast  Result Date: 01/25/2017 CLINICAL DATA:  Right lower outer quadrant breast carcinoma. Previous bilateral mastectomies, chemotherapy, and radiation therapy. Elevated tumor markers. EXAM: CT CHEST, ABDOMEN, AND PELVIS WITH CONTRAST TECHNIQUE: Multidetector CT imaging of the chest, abdomen and pelvis was performed following the standard protocol during bolus administration of intravenous contrast. CONTRAST:  133m ISOVUE-300 IOPAMIDOL (ISOVUE-300) INJECTION 61%  COMPARISON:  PET-CT on 08/17/2011 FINDINGS: CT CHEST FINDINGS Cardiovascular: No acute findings. Mediastinum/Lymph Nodes: Previous bilateral mastectomies and right axillary lymph node dissection. No axillary lymphadenopathy identified. New mediastinal lymphadenopathy is seen right paratracheal region measuring 2.0 cm on image 19/2. No other sites of lymphadenopathy identified. Lungs/Pleura: No pulmonary infiltrate or mass identified. Right upper lobe radiation changes seen. No effusion present. Musculoskeletal:  No suspicious bone lesions identified. CT ABDOMEN AND PELVIS FINDINGS Hepatobiliary: No masses identified. Moderate diffuse hepatic steatosis. Gallbladder is unremarkable. Pancreas:  No mass or inflammatory changes. Spleen: Within normal limits in size. Tiny sub-cm low attenuation lesion is too small to characterize, but statistically most likely represents a tiny benign lesion such as a cyst or hemangioma. Adrenals/Urinary tract: A complex cyst with a thin internal septation possible contrast enhancement is seen in the posterior interpolar region of the left kidney. This measures 4.4 x 4.3 cm on image 70/2, compared to 3.9 cm on previous noncontrast exam. Tiny sub-cm low attenuation lesions in the right kidney are too small to characterize, but statistically most likely represent tiny cysts. No evidence of hydronephrosis. Unremarkable unopacified urinary bladder. Stomach/Bowel: No evidence of obstruction, inflammatory process, or abnormal fluid collections. Normal appendix visualized. Mild diverticulosis involving descending and sigmoid colon. No evidence of diverticulitis. Vascular/Lymphatic: No pathologically enlarged lymph nodes identified. No abdominal aortic aneurysm. Aortic atherosclerosis. Reproductive:  No mass or other significant abnormality identified. Other:  None. Musculoskeletal:  No suspicious bone lesions identified. IMPRESSION: New mild right paratracheal mediastinal lymphadenopathy,  consistent with metastatic disease. No other sites of metastatic disease identified. Mildly complex right renal cyst. Recommend continued attention on follow-up CT. Diffuse hepatic steatosis. Colonic diverticulosis. No radiographic evidence of diverticulitis. Electronically Signed   By: JEarle GellM.D.   On: 01/25/2017 14:07   Ct Abdomen Pelvis W Contrast  Result Date: 01/25/2017 CLINICAL DATA:  Right lower outer quadrant breast carcinoma. Previous bilateral mastectomies, chemotherapy, and radiation therapy. Elevated tumor markers. EXAM: CT CHEST, ABDOMEN, AND PELVIS WITH CONTRAST TECHNIQUE: Multidetector CT imaging of the chest, abdomen and pelvis was performed following the standard protocol during bolus administration of intravenous contrast. CONTRAST:  1067mISOVUE-300 IOPAMIDOL (ISOVUE-300) INJECTION 61% COMPARISON:  PET-CT on 08/17/2011 FINDINGS: CT CHEST FINDINGS Cardiovascular: No acute findings. Mediastinum/Lymph Nodes: Previous bilateral mastectomies and right axillary lymph node dissection. No axillary lymphadenopathy identified. New mediastinal lymphadenopathy is seen right paratracheal region measuring 2.0 cm on image 19/2. No other sites of lymphadenopathy identified. Lungs/Pleura: No pulmonary infiltrate or mass identified. Right upper lobe radiation changes seen. No effusion present. Musculoskeletal:  No suspicious  bone lesions identified. CT ABDOMEN AND PELVIS FINDINGS Hepatobiliary: No masses identified. Moderate diffuse hepatic steatosis. Gallbladder is unremarkable. Pancreas:  No mass or inflammatory changes. Spleen: Within normal limits in size. Tiny sub-cm low attenuation lesion is too small to characterize, but statistically most likely represents a tiny benign lesion such as a cyst or hemangioma. Adrenals/Urinary tract: A complex cyst with a thin internal septation possible contrast enhancement is seen in the posterior interpolar region of the left kidney. This measures 4.4 x 4.3 cm on  image 70/2, compared to 3.9 cm on previous noncontrast exam. Tiny sub-cm low attenuation lesions in the right kidney are too small to characterize, but statistically most likely represent tiny cysts. No evidence of hydronephrosis. Unremarkable unopacified urinary bladder. Stomach/Bowel: No evidence of obstruction, inflammatory process, or abnormal fluid collections. Normal appendix visualized. Mild diverticulosis involving descending and sigmoid colon. No evidence of diverticulitis. Vascular/Lymphatic: No pathologically enlarged lymph nodes identified. No abdominal aortic aneurysm. Aortic atherosclerosis. Reproductive:  No mass or other significant abnormality identified. Other:  None. Musculoskeletal:  No suspicious bone lesions identified. IMPRESSION: New mild right paratracheal mediastinal lymphadenopathy, consistent with metastatic disease. No other sites of metastatic disease identified. Mildly complex right renal cyst. Recommend continued attention on follow-up CT. Diffuse hepatic steatosis. Colonic diverticulosis. No radiographic evidence of diverticulitis. Electronically Signed   By: Earle Gell M.D.   On: 01/25/2017 14:07     Assessment and plan- Patient is a 61 y.o. female with h/o T2N2M0 er pr positive breast cancer. She received neoadjuvant chemotherapy followed by surgery and radiation. Found to have her 2 positive disease on surgery in her LN and received adjuvant herceptin for 1 year. She has been on letrozole since 2012  I discussed the findings of the PET/CT scan which were reviewed at the tumor Board today. She does have hypermetabolic mediastinal lymph nodes but there was no evidence of hypermetabolic noted elsewhere. We will get the offical report soon.   I explained to her that this could mean metastatic breast cancer versus lung cancer although she has never been a smoker in the past versus possible lymphoma. She will need EBUS guided biopsy of her LN and I will refer her to Dr. Mortimer Fries  for the same. We are likely dealing with breast cancer and treatment will depend on ER PR and her 2 status. She will see Dr. Rogue Bussing in about 2 weeks back when he returns from his vacation. If her biopsy is back prior to that I will discuss biopsy results with her and potential treatment options but defer treatment decision to Dr. Rogue Bussing who is her primary oncologist   Visit Diagnosis 1. Carcinoma of lower-outer quadrant of right breast in female, estrogen receptor positive ([place])   2. Mediastinal adenopathy      Dr. Randa Evens, MD, MPH [facility] at [facility] Pager- [phone] 02/03/2017 2:42 PM

## 2017-02-04 ENCOUNTER — Telehealth: Payer: Self-pay | Admitting: *Deleted

## 2017-02-04 NOTE — Telephone Encounter (Signed)
Noted. Patient aware. She is scheduled for pulmonary referral

## 2017-02-04 NOTE — Telephone Encounter (Signed)
Radiology called to let us know that the PET scan done Monday is finally read   IMPRESSION: 1. Hypermetabolic mediastinal and RIGHT hilar lymph nodes consistent breast cancer recurrence. 2. Single hypermetabolic skeletal metastasis within the sternum 3. Hepatic steatosis.

## 2017-02-04 NOTE — Telephone Encounter (Signed)
Pt scheduled for consultation with Dr. Mortimer Fries on 6/12 at 11:45am. Pt is aware and instructed to arrive at 11:30am.

## 2017-02-08 ENCOUNTER — Ambulatory Visit (INDEPENDENT_AMBULATORY_CARE_PROVIDER_SITE_OTHER): Payer: BLUE CROSS/BLUE SHIELD | Admitting: Internal Medicine

## 2017-02-08 ENCOUNTER — Encounter: Payer: Self-pay | Admitting: Internal Medicine

## 2017-02-08 VITALS — BP 148/88 | HR 96 | Wt 185.0 lb

## 2017-02-08 DIAGNOSIS — R590 Localized enlarged lymph nodes: Secondary | ICD-10-CM | POA: Diagnosis not present

## 2017-02-08 NOTE — Progress Notes (Signed)
Name: Kimberly [name] MRN: [id number] DOB: [birth year]     CONSULTATION DATE: (Not on file)  REFERRING MD : Melanie Crazier, Dr. Janese Banks  CHIEF COMPLAINT: Abnormal CT chest and PET scan  HISTORY OF PRESENT ILLNESS:  61 year old white female seen today for abnormal CT scans and PET scan findings Patient has a history of right wrist cancer diagnosed in 2012 Patient underwent bilateral mastectomy and [place] Underwent chemotherapy and radiation therapy completed in 2014 Over the past several years patient has had surveillance CT scans and tumor markers In the last several months her tumor markers were increasing and a follow-up PET scan and CT scan was ordered by her oncologist  After further review of CT scans and PET scans and reviewing the scans with the patient patient has significant mediastinal adenopathy in the right peritracheal and subcarinal areas  I explained the risks and benefits of endobronchial ultrasound which she will need for further diagnostic testing  Patient denies any shortness of breath chest pain palpitations No signs of infection at this time No signs of congestive heart failure at this time Patient has a family history of breast cancer -mother  Patient is nonsmoker No secondhand smoke exposure No alcohol abuse Works as Glass blower/designer   Other review of systems includes no weight loss no fevers no chills  has occasional night sweats  PAST MEDICAL HISTORY :   has a past medical history of Cancer of lower-outer quadrant of female breast ([place]) (08/06/2011); Diabetes mellitus ([place]) (03/24/2012); High cholesterol; Hypertension; PONV (postoperative nausea and vomiting); and Type II diabetes mellitus ([place]).  has a past surgical history that includes Cesarean section (1443,1540); Wisdom tooth extraction; Portacath placement (07/2011); Mastectomy (03/07/12); Breast biopsy (07/2011); Modified mastectomy (03/07/2012); Breast reconstruction (03/07/2012); Scar revision  (03/30/2012); Breast reconstruction (02/06/2013); and Latissimus flap to breast (Right, 02/06/2013). Prior to Admission medications   Medication Sig Start Date End Date Taking? Authorizing Provider  Calcium Carbonate-Vitamin D (CALCIUM 600 + D PO) Take 1 tablet by mouth 2 (two) times daily.    [provider]  insulin glargine (LANTUS) 100 UNIT/ML injection Inject 38-40 Units into the skin at bedtime.    [provider]  KOMBIGLYZE XR 2.12-998 MG TB24 Take 1 tablet by mouth 2 (two) times daily. 06/12/15   [provider]  letrozole (FEMARA) 2.5 MG tablet TAKE 1 TABLET (2.5 MG TOTAL) BY MOUTH DAILY. 07/06/16   [name], MD  lisinopril (PRINIVIL,ZESTRIL) 10 MG tablet Take 10 mg by mouth daily.    [provider]   Allergies  Allergen Reactions  . Ciprofloxacin Nausea And Vomiting  . Sulfa Antibiotics Rash    FAMILY HISTORY:  family history includes Cancer in her mother. SOCIAL HISTORY:  reports that she has never smoked. She has never used smokeless tobacco. She reports that she does not drink alcohol or use drugs.  REVIEW OF SYSTEMS:   Constitutional: Negative for fever, chills, weight loss, malaise/fatigue and diaphoresis.  HENT: Negative for hearing loss, ear pain, nosebleeds, congestion, sore throat, neck pain, tinnitus and ear discharge.   Eyes: Negative for blurred vision, double vision, photophobia, pain, discharge and redness.  Respiratory: Negative for cough, hemoptysis, sputum production, shortness of breath, wheezing and stridor.   Cardiovascular: Negative for chest pain, palpitations, orthopnea, claudication, leg swelling and PND.  Gastrointestinal: Negative for heartburn, nausea, vomiting, abdominal pain, diarrhea, constipation, blood in stool and melena.  Genitourinary: Negative for dysuria, urgency, frequency, hematuria and flank pain.  Musculoskeletal: Negative for myalgias,  back pain, joint pain and falls.  Skin: Negative for  itching and rash.  Neurological: Negative for dizziness, tingling, tremors, sensory change, speech change, focal weakness, seizures, loss of consciousness, weakness and headaches.  Endo/Heme/Allergies: Negative for environmental allergies and polydipsia. Does not bruise/bleed easily.  ALL OTHER ROS ARE NEGATIVE   BP (!) 148/88 (BP Location: Left Arm, Cuff Size: Normal)   Pulse 96   Wt 185 lb (83.9 kg)   SpO2 97%   BMI 33.84 kg/m    Physical Examination:   GENERAL:NAD, no fevers, chills, no weakness no fatigue HEAD: Normocephalic, atraumatic.  EYES: Pupils equal, round, reactive to light. Extraocular muscles intact. No scleral icterus.  MOUTH: Moist mucosal membrane.   EAR, NOSE, THROAT: Clear without exudates. No external lesions.  NECK: Supple. No thyromegaly. No nodules. No JVD.  PULMONARY:CTA B/L no wheezes, no crackles, no rhonchi CARDIOVASCULAR: S1 and S2. Regular rate and rhythm. No murmurs, rubs, or gallops. No edema.  GASTROINTESTINAL: Soft, nontender, nondistended. No masses. Positive bowel sounds.  MUSCULOSKELETAL: No swelling, clubbing, or edema. Range of motion full in all extremities.  NEUROLOGIC: Cranial nerves II through XII are intact. No gross focal neurological deficits.  SKIN: No ulceration, lesions, rashes, or cyanosis. Skin warm and dry. Turgor intact.  PSYCHIATRIC: Mood, affect within normal limits. The patient is awake, alert and oriented x 3. Insight, judgment intact.      CT chest and PET scans reviewed indendently I have Independently reviewed images   on 02/08/2017 CT chest 01/25/2017 Right paratracheal adenopathy  PET scan on January 31, 2017 Increased SUV uptake of the subcarinal and right paratracheal mediastinal adenopathy   ASSESSMENT / PLAN: 61 year old pleasant white female with a history of breast cancer with increasing tumor markers in the setting of abnormal PET scan and CT scan findings related to right paratracheal adenopathy and subcarinal  adenopathy  After further review of findings this is most likely metastatic breast cancer however will need to rule out primary lung malignancy and rule out sarcoid   The Risks and Benefits of the Bronchoscopy with EBUS were explained to patient/family and I have discussed the risk for acute bleeding, increased chance of infection, increased chance of respiratory failure and cardiac arrest and death. I have also explained to avoid all types of NSAIDs to decrease chance of bleeding, and to avoid food and drinks the midnight prior to procedure.  The procedure consists of a video camera with a light source to be placed and inserted  into the lungs to  look for abnormal tissue and to obtain tissue samples by using needle and biopsy tools.  The patient/family understand the risks and benefits and have agreed to proceed with procedure.  Patient/Family are satisfied with Plan of action and management. All questions answered  Corrin Parker, M.D.  [facility]  Medical Director [place] Director [facility] Cardio-Pulmonary Department

## 2017-02-08 NOTE — Patient Instructions (Signed)
Need to set up for EBUS for adenopathy

## 2017-02-09 ENCOUNTER — Telehealth: Payer: Self-pay | Admitting: Internal Medicine

## 2017-02-09 ENCOUNTER — Telehealth: Payer: Self-pay | Admitting: *Deleted

## 2017-02-09 NOTE — Telephone Encounter (Signed)
Spoke with patient regarding upcoming bronchoscopy. Pt to have EBUS on 6/28. Pt will be out of town the week of July 4th and requests that biopsy results be called to her. Informed pt that that would not be a problem and we will give her a call as soon as results are available then coordinate future appts depending on biopsy results. Pt will be back in town July 10th for any future appointments that need to be scheduled.

## 2017-02-09 NOTE — Telephone Encounter (Signed)
Hayley- Can you please inform DR. B of this since he will be back in town and she is his patient?  Thanks, Astrid Divine

## 2017-02-09 NOTE — Telephone Encounter (Signed)
I have spoke with pt and informed her of the procedure and PAT appt dates and times. Nothing further needed.

## 2017-02-09 NOTE — Telephone Encounter (Signed)
Pt is calling about procedure date.She would like it on the 20th.  Please call and advise.

## 2017-02-10 NOTE — Telephone Encounter (Signed)
No problem. I forwarded the message to him and also informed Nira Conn so Dr. B can be updated as well.

## 2017-02-17 ENCOUNTER — Encounter
Admission: RE | Admit: 2017-02-17 | Discharge: 2017-02-17 | Disposition: A | Discharge status: Home / Self Care | Payer: BLUE CROSS/BLUE SHIELD | Source: Ambulatory Visit | Attending: Internal Medicine | Admitting: Internal Medicine

## 2017-02-17 HISTORY — DX: Personal history of urinary calculi: Z87.442

## 2017-02-17 HISTORY — DX: Unspecified osteoarthritis, unspecified site: M19.90

## 2017-02-17 NOTE — Patient Instructions (Addendum)
  Your procedure is scheduled on: 02-24-17 THURSDAY Report to Same Day Surgery 2nd floor medical mall [facility] Entrance-take elevator on left to 2nd floor.  Check in with surgery information desk.) To find out your arrival time please call [phone] between 1PM - 3PM on 02-23-17 [facility]  Remember: Instructions that are not followed completely may result in serious medical risk, up to and including death, or upon the discretion of your surgeon and anesthesiologist your surgery may need to be rescheduled.    _x___ 1. Do not eat food or drink liquids after midnight. No gum chewing or hard candies.     __x__ 2. No Alcohol for 24 hours before or after surgery.   __x__3. No Smoking for 24 prior to surgery.   ____  4. Bring all medications with you on the day of surgery if instructed.    __x__ 5. Notify your doctor if there is any change in your medical condition     (cold, fever, infections).     Do not wear jewelry, make-up, hairpins, clips or nail polish.  Do not wear lotions, powders, or perfumes. You may wear deodorant.  Do not shave 48 hours prior to surgery. Men may shave face and neck.  Do not bring valuables to the hospital.    [facility] is not responsible for any belongings or valuables.               Contacts, dentures or bridgework may not be worn into surgery.  Leave your suitcase in the car. After surgery it may be brought to your room.  For patients admitted to the hospital, discharge time is determined by your treatment team.   Patients discharged the day of surgery will not be allowed to drive home.  You will need someone to drive you home and stay with you the night of your procedure.    Please read over the following fact sheets that you were given:    _x___ [place] WITH A SMALL SIP OF WATER. These include:  1. LISINOPRIL  2. ATORVASTATIN  3.  4.  5.  6.  ____Fleets enema or Magnesium Citrate as  directed.   ____ Use CHG Soap or sage wipes as directed on instruction sheet   ____ Use inhalers on the day of surgery and bring to hospital day of surgery  _X___ Stop Metformin and Janumet 2 days prior to surgery-LAST [place] ON Monday, June 25TH   ____ Take 1/2 of usual insulin dose the night before surgery and none on the morning  surgery.   ____ Follow recommendations from Cardiologist, Pulmonologist or PCP regarding stopping Aspirin, Coumadin, Pllavix ,Eliquis, Effient, or Pradaxa, and Pletal.  X____Stop Anti-inflammatories such as Advil, Aleve, Ibuprofen, Motrin, Naproxen, Naprosyn, Goodies powders or aspirin products NOW-OK to take Tylenol    ____ Stop supplements until after surgery.     ____ Bring C-Pap to the hospital.

## 2017-02-21 ENCOUNTER — Encounter
Admission: RE | Admit: 2017-02-21 | Discharge: 2017-02-21 | Disposition: A | Discharge status: Home / Self Care | Payer: BLUE CROSS/BLUE SHIELD | Source: Ambulatory Visit | Attending: Internal Medicine | Admitting: Internal Medicine

## 2017-02-21 DIAGNOSIS — Z79899 Other long term (current) drug therapy: Secondary | ICD-10-CM | POA: Diagnosis not present

## 2017-02-21 DIAGNOSIS — I10 Essential (primary) hypertension: Secondary | ICD-10-CM | POA: Diagnosis not present

## 2017-02-21 DIAGNOSIS — E7800 Pure hypercholesterolemia, unspecified: Secondary | ICD-10-CM | POA: Diagnosis not present

## 2017-02-21 DIAGNOSIS — Z803 Family history of malignant neoplasm of breast: Secondary | ICD-10-CM | POA: Diagnosis not present

## 2017-02-21 DIAGNOSIS — Z794 Long term (current) use of insulin: Secondary | ICD-10-CM | POA: Diagnosis not present

## 2017-02-21 DIAGNOSIS — Z853 Personal history of malignant neoplasm of breast: Secondary | ICD-10-CM | POA: Diagnosis not present

## 2017-02-21 DIAGNOSIS — Z8589 Personal history of malignant neoplasm of other organs and systems: Secondary | ICD-10-CM | POA: Diagnosis not present

## 2017-02-21 DIAGNOSIS — C771 Secondary and unspecified malignant neoplasm of intrathoracic lymph nodes: Secondary | ICD-10-CM | POA: Diagnosis not present

## 2017-02-21 DIAGNOSIS — M1990 Unspecified osteoarthritis, unspecified site: Secondary | ICD-10-CM | POA: Diagnosis not present

## 2017-02-21 DIAGNOSIS — E119 Type 2 diabetes mellitus without complications: Secondary | ICD-10-CM | POA: Diagnosis not present

## 2017-02-21 DIAGNOSIS — R599 Enlarged lymph nodes, unspecified: Secondary | ICD-10-CM | POA: Diagnosis present

## 2017-02-21 HISTORY — DX: Dizziness and giddiness: R42

## 2017-02-21 HISTORY — DX: Breast implant status: Z98.82

## 2017-02-23 ENCOUNTER — Encounter: Payer: Self-pay | Admitting: *Deleted

## 2017-02-24 ENCOUNTER — Encounter
Admission: RE | Disposition: A | Discharge status: Home / Self Care | Payer: Self-pay | Source: Ambulatory Visit | Attending: Internal Medicine

## 2017-02-24 ENCOUNTER — Ambulatory Visit: Payer: BLUE CROSS/BLUE SHIELD | Admitting: Anesthesiology

## 2017-02-24 ENCOUNTER — Ambulatory Visit
Admission: RE | Admit: 2017-02-24 | Discharge: 2017-02-24 | Disposition: A | Discharge status: Home / Self Care | Payer: BLUE CROSS/BLUE SHIELD | Source: Ambulatory Visit | Attending: Internal Medicine | Admitting: Internal Medicine

## 2017-02-24 ENCOUNTER — Encounter: Payer: Self-pay | Admitting: *Deleted

## 2017-02-24 DIAGNOSIS — E119 Type 2 diabetes mellitus without complications: Secondary | ICD-10-CM | POA: Insufficient documentation

## 2017-02-24 DIAGNOSIS — E7800 Pure hypercholesterolemia, unspecified: Secondary | ICD-10-CM | POA: Insufficient documentation

## 2017-02-24 DIAGNOSIS — Z803 Family history of malignant neoplasm of breast: Secondary | ICD-10-CM | POA: Insufficient documentation

## 2017-02-24 DIAGNOSIS — R591 Generalized enlarged lymph nodes: Secondary | ICD-10-CM | POA: Diagnosis not present

## 2017-02-24 DIAGNOSIS — I10 Essential (primary) hypertension: Secondary | ICD-10-CM | POA: Insufficient documentation

## 2017-02-24 DIAGNOSIS — Z794 Long term (current) use of insulin: Secondary | ICD-10-CM | POA: Insufficient documentation

## 2017-02-24 DIAGNOSIS — C771 Secondary and unspecified malignant neoplasm of intrathoracic lymph nodes: Secondary | ICD-10-CM | POA: Insufficient documentation

## 2017-02-24 DIAGNOSIS — Z853 Personal history of malignant neoplasm of breast: Secondary | ICD-10-CM | POA: Insufficient documentation

## 2017-02-24 DIAGNOSIS — Z8589 Personal history of malignant neoplasm of other organs and systems: Secondary | ICD-10-CM | POA: Insufficient documentation

## 2017-02-24 DIAGNOSIS — Z79899 Other long term (current) drug therapy: Secondary | ICD-10-CM | POA: Insufficient documentation

## 2017-02-24 DIAGNOSIS — R599 Enlarged lymph nodes, unspecified: Secondary | ICD-10-CM

## 2017-02-24 DIAGNOSIS — M1990 Unspecified osteoarthritis, unspecified site: Secondary | ICD-10-CM | POA: Insufficient documentation

## 2017-02-24 HISTORY — PX: ENDOBRONCHIAL ULTRASOUND: SHX5096

## 2017-02-24 LAB — GLUCOSE, CAPILLARY
GLUCOSE-CAPILLARY: 192 mg/dL — AB (ref 65–99)
Glucose-Capillary: 221 mg/dL — ABNORMAL HIGH (ref 65–99)

## 2017-02-24 SURGERY — ENDOBRONCHIAL ULTRASOUND (EBUS)
Anesthesia: General

## 2017-02-24 MED ORDER — SUGAMMADEX SODIUM 200 MG/2ML IV SOLN
INTRAVENOUS | Status: DC | PRN
  Administered 2017-02-24: 170 mg via INTRAVENOUS

## 2017-02-24 MED ORDER — LIDOCAINE HCL (CARDIAC) 20 MG/ML IV SOLN
INTRAVENOUS | Status: DC | PRN
  Administered 2017-02-24: 100 mg via INTRAVENOUS

## 2017-02-24 MED ORDER — PHENYLEPHRINE HCL 10 MG/ML IJ SOLN
INTRAMUSCULAR | Status: DC | PRN
  Administered 2017-02-24: 200 ug via INTRAVENOUS

## 2017-02-24 MED ORDER — SCOPOLAMINE 1 MG/3DAYS TD PT72
1.0000 | MEDICATED_PATCH | TRANSDERMAL | Status: DC
  Administered 2017-02-24: 1.5 mg via TRANSDERMAL

## 2017-02-24 MED ORDER — FENTANYL CITRATE (PF) 100 MCG/2ML IJ SOLN
INTRAMUSCULAR | Status: AC
  Filled 2017-02-24: qty 2

## 2017-02-24 MED ORDER — OXYCODONE HCL 5 MG/5ML PO SOLN: 5.0000 mg | Freq: Once | ORAL | Status: DC | PRN

## 2017-02-24 MED ORDER — PROPOFOL 10 MG/ML IV BOLUS
INTRAVENOUS | Status: AC
  Filled 2017-02-24: qty 20

## 2017-02-24 MED ORDER — FAMOTIDINE 20 MG PO TABS
ORAL_TABLET | ORAL | Status: AC
  Filled 2017-02-24: qty 1

## 2017-02-24 MED ORDER — FENTANYL CITRATE (PF) 100 MCG/2ML IJ SOLN: 25.0000 ug | INTRAMUSCULAR | Status: DC | PRN

## 2017-02-24 MED ORDER — ROCURONIUM BROMIDE 50 MG/5ML IV SOLN
INTRAVENOUS | Status: AC
  Filled 2017-02-24: qty 1

## 2017-02-24 MED ORDER — PHENYLEPHRINE HCL 10 MG/ML IJ SOLN
INTRAMUSCULAR | Status: AC
  Filled 2017-02-24: qty 1

## 2017-02-24 MED ORDER — MIDAZOLAM HCL 2 MG/2ML IJ SOLN
INTRAMUSCULAR | Status: AC
  Filled 2017-02-24: qty 2

## 2017-02-24 MED ORDER — FAMOTIDINE 20 MG PO TABS
20.0000 mg | ORAL_TABLET | Freq: Once | ORAL | Status: AC
  Administered 2017-02-24: 20 mg via ORAL

## 2017-02-24 MED ORDER — FENTANYL CITRATE (PF) 100 MCG/2ML IJ SOLN
INTRAMUSCULAR | Status: DC | PRN
  Administered 2017-02-24: 50 ug via INTRAVENOUS

## 2017-02-24 MED ORDER — ONDANSETRON HCL 4 MG/2ML IJ SOLN
INTRAMUSCULAR | Status: AC
  Filled 2017-02-24: qty 2

## 2017-02-24 MED ORDER — DEXAMETHASONE SODIUM PHOSPHATE 10 MG/ML IJ SOLN
INTRAMUSCULAR | Status: DC | PRN
  Administered 2017-02-24: 10 mg via INTRAVENOUS

## 2017-02-24 MED ORDER — SUGAMMADEX SODIUM 200 MG/2ML IV SOLN
INTRAVENOUS | Status: AC
  Filled 2017-02-24: qty 2

## 2017-02-24 MED ORDER — MIDAZOLAM HCL 5 MG/5ML IJ SOLN
INTRAMUSCULAR | Status: DC | PRN
  Administered 2017-02-24: 2 mg via INTRAVENOUS

## 2017-02-24 MED ORDER — ONDANSETRON HCL 4 MG/2ML IJ SOLN
INTRAMUSCULAR | Status: DC | PRN
  Administered 2017-02-24: 4 mg via INTRAVENOUS

## 2017-02-24 MED ORDER — PROPOFOL 10 MG/ML IV BOLUS
INTRAVENOUS | Status: DC | PRN
  Administered 2017-02-24: 160 mg via INTRAVENOUS

## 2017-02-24 MED ORDER — DEXAMETHASONE SODIUM PHOSPHATE 10 MG/ML IJ SOLN
INTRAMUSCULAR | Status: AC
  Filled 2017-02-24: qty 1

## 2017-02-24 MED ORDER — OXYCODONE HCL 5 MG PO TABS: 5.0000 mg | ORAL_TABLET | Freq: Once | ORAL | Status: DC | PRN

## 2017-02-24 MED ORDER — ROCURONIUM BROMIDE 100 MG/10ML IV SOLN
INTRAVENOUS | Status: DC | PRN
  Administered 2017-02-24: 30 mg via INTRAVENOUS
  Administered 2017-02-24: 20 mg via INTRAVENOUS

## 2017-02-24 MED ORDER — SUCCINYLCHOLINE CHLORIDE 20 MG/ML IJ SOLN
INTRAMUSCULAR | Status: AC
  Filled 2017-02-24: qty 1

## 2017-02-24 MED ORDER — SCOPOLAMINE 1 MG/3DAYS TD PT72
MEDICATED_PATCH | TRANSDERMAL | Status: AC
  Filled 2017-02-24: qty 1

## 2017-02-24 MED ORDER — LIDOCAINE HCL (PF) 2 % IJ SOLN
INTRAMUSCULAR | Status: AC
  Filled 2017-02-24: qty 2

## 2017-02-24 MED ORDER — SODIUM CHLORIDE 0.9 % IV SOLN
INTRAVENOUS | Status: DC
  Administered 2017-02-24: 13:00:00 via INTRAVENOUS

## 2017-02-24 NOTE — Anesthesia Post-op Follow-up Note (Cosign Needed)
Anesthesia QCDR form completed.        

## 2017-02-24 NOTE — H&P (View-Only) (Signed)
Name: Kimberly [name] MRN: [id number] DOB: 29-Jan-1956     CONSULTATION DATE: (Not on file)  REFERRING MD : Melanie Crazier, Dr. Janese Banks  CHIEF COMPLAINT: Abnormal CT chest and PET scan  HISTORY OF PRESENT ILLNESS:  61 year old white female seen today for abnormal CT scans and PET scan findings Patient has a history of right wrist cancer diagnosed in 2012 Patient underwent bilateral mastectomy and [place] Underwent chemotherapy and radiation therapy completed in 2014 Over the past several years patient has had surveillance CT scans and tumor markers In the last several months her tumor markers were increasing and a follow-up PET scan and CT scan was ordered by her oncologist  After further review of CT scans and PET scans and reviewing the scans with the patient patient has significant mediastinal adenopathy in the right peritracheal and subcarinal areas  I explained the risks and benefits of endobronchial ultrasound which she will need for further diagnostic testing  Patient denies any shortness of breath chest pain palpitations No signs of infection at this time No signs of congestive heart failure at this time Patient has a family history of breast cancer -mother  Patient is nonsmoker No secondhand smoke exposure No alcohol abuse Works as Glass blower/designer   Other review of systems includes no weight loss no fevers no chills  has occasional night sweats  PAST MEDICAL HISTORY :   has a past medical history of Cancer of lower-outer quadrant of female breast ([place]) (08/06/2011); Diabetes mellitus ([place]) (03/24/2012); High cholesterol; Hypertension; PONV (postoperative nausea and vomiting); and Type II diabetes mellitus ([place]).  has a past surgical history that includes Cesarean section (2353,6144); Wisdom tooth extraction; Portacath placement (07/2011); Mastectomy (03/07/12); Breast biopsy (07/2011); Modified mastectomy (03/07/2012); Breast reconstruction (03/07/2012); Scar revision  (03/30/2012); Breast reconstruction (02/06/2013); and Latissimus flap to breast (Right, 02/06/2013). Prior to Admission medications   Medication Sig Start Date End Date Taking? Authorizing Provider  Calcium Carbonate-Vitamin D (CALCIUM 600 + D PO) Take 1 tablet by mouth 2 (two) times daily.    [provider]  insulin glargine (LANTUS) 100 UNIT/ML injection Inject 38-40 Units into the skin at bedtime.    [provider]  KOMBIGLYZE XR 2.12-998 MG TB24 Take 1 tablet by mouth 2 (two) times daily. 06/12/15   [provider]  letrozole (FEMARA) 2.5 MG tablet TAKE 1 TABLET (2.5 MG TOTAL) BY MOUTH DAILY. 07/06/16   [name], MD  lisinopril (PRINIVIL,ZESTRIL) 10 MG tablet Take 10 mg by mouth daily.    [provider]   Allergies  Allergen Reactions  . Ciprofloxacin Nausea And Vomiting  . Sulfa Antibiotics Rash    FAMILY HISTORY:  family history includes Cancer in her mother. SOCIAL HISTORY:  reports that she has never smoked. She has never used smokeless tobacco. She reports that she does not drink alcohol or use drugs.  REVIEW OF SYSTEMS:   Constitutional: Negative for fever, chills, weight loss, malaise/fatigue and diaphoresis.  HENT: Negative for hearing loss, ear pain, nosebleeds, congestion, sore throat, neck pain, tinnitus and ear discharge.   Eyes: Negative for blurred vision, double vision, photophobia, pain, discharge and redness.  Respiratory: Negative for cough, hemoptysis, sputum production, shortness of breath, wheezing and stridor.   Cardiovascular: Negative for chest pain, palpitations, orthopnea, claudication, leg swelling and PND.  Gastrointestinal: Negative for heartburn, nausea, vomiting, abdominal pain, diarrhea, constipation, blood in stool and melena.  Genitourinary: Negative for dysuria, urgency, frequency, hematuria and flank pain.  Musculoskeletal: Negative for myalgias,  back pain, joint pain and falls.  Skin: Negative for  itching and rash.  Neurological: Negative for dizziness, tingling, tremors, sensory change, speech change, focal weakness, seizures, loss of consciousness, weakness and headaches.  Endo/Heme/Allergies: Negative for environmental allergies and polydipsia. Does not bruise/bleed easily.  ALL OTHER ROS ARE NEGATIVE   BP (!) 148/88 (BP Location: Left Arm, Cuff Size: Normal)   Pulse 96   Wt 185 lb (83.9 kg)   SpO2 97%   BMI 33.84 kg/m    Physical Examination:   GENERAL:NAD, no fevers, chills, no weakness no fatigue HEAD: Normocephalic, atraumatic.  EYES: Pupils equal, round, reactive to light. Extraocular muscles intact. No scleral icterus.  MOUTH: Moist mucosal membrane.   EAR, NOSE, THROAT: Clear without exudates. No external lesions.  NECK: Supple. No thyromegaly. No nodules. No JVD.  PULMONARY:CTA B/L no wheezes, no crackles, no rhonchi CARDIOVASCULAR: S1 and S2. Regular rate and rhythm. No murmurs, rubs, or gallops. No edema.  GASTROINTESTINAL: Soft, nontender, nondistended. No masses. Positive bowel sounds.  MUSCULOSKELETAL: No swelling, clubbing, or edema. Range of motion full in all extremities.  NEUROLOGIC: Cranial nerves II through XII are intact. No gross focal neurological deficits.  SKIN: No ulceration, lesions, rashes, or cyanosis. Skin warm and dry. Turgor intact.  PSYCHIATRIC: Mood, affect within normal limits. The patient is awake, alert and oriented x 3. Insight, judgment intact.      CT chest and PET scans reviewed indendently I have Independently reviewed images   on 02/08/2017 CT chest 01/25/2017 Right paratracheal adenopathy  PET scan on January 31, 2017 Increased SUV uptake of the subcarinal and right paratracheal mediastinal adenopathy   ASSESSMENT / PLAN: 61 year old [place] white female with a history of breast cancer with increasing tumor markers in the setting of abnormal PET scan and CT scan findings related to right paratracheal adenopathy and subcarinal  adenopathy  After further review of findings this is most likely metastatic breast cancer however will need to rule out primary lung malignancy and rule out sarcoid   The Risks and Benefits of the Bronchoscopy with EBUS were explained to patient/family and I have discussed the risk for acute bleeding, increased chance of infection, increased chance of respiratory failure and cardiac arrest and death. I have also explained to avoid all types of NSAIDs to decrease chance of bleeding, and to avoid food and drinks the midnight prior to procedure.  The procedure consists of a video camera with a light source to be placed and inserted  into the lungs to  look for abnormal tissue and to obtain tissue samples by using needle and biopsy tools.  The patient/family understand the risks and benefits and have agreed to proceed with procedure.  Patient/Family are satisfied with Plan of action and management. All questions answered  Corrin Parker, M.D.  [facility]  Medical Director [place] Director [facility] Cardio-Pulmonary Department

## 2017-02-24 NOTE — Op Note (Signed)
PROCEDURE: ENDOBRONCHIAL ULTRASOUND   PROCEDURE DATE: 02/24/2017  TIME:  NAME:  Kimberly [name]  DOB:12-26-1955  MRN: [id number] LOC:  ARPO/None    HOSP DAY: @LENGTHOFSTAYDAYS @ CODE STATUS:   Code Status History    This patient does not have a recorded code status. Please follow your organizational policy for patients in this situation.          Indications/Preliminary Diagnosis:   Consent: (Place X beside choice/s below)  The benefits, risks and possible complications of the procedure were        explained to:  _x__ patient  ___ patient's family  ___ other:___________  who verbalized understanding and gave:  ___ verbal  ___ written  _x__ verbal and written  ___ telephone  ___ other:________ consent.      Unable to obtain consent; procedure performed on emergent basis.     Other:       PRESEDATION ASSESSMENT: History and Physical has been performed. Patient meds and allergies have been reviewed. Presedation airway examination has been performed and documented. Baseline vital signs, sedation score, oxygenation status, and cardiac rhythm were reviewed. Patient was deemed to be in satisfactory condition to undergo the procedure.    PREMEDICATIONS: SEE ANESTHESIOLOGY RECORDS   Airway Prep (Place X beside choice below)   1% Transtracheal Lidocaine Anesthetization 7 cc   Patient prepped per Bronchoscopy Lab Policy       Insertion Route (Place X beside choice below)   Nasal   Oral  x Endotracheal Tube   Tracheostomy   INTRAPROCEDURE MEDICATIONS: SEE ANESTHESIOLOGY RECORDS   PROCEDURE DETAILS: Timeout performed and correct patient, name, & ID confirmed. Following prep per Pulmonary policy, appropriate sedation was administered.  Airway exam proceeded with findings, technical procedures, and specimen collection as noted below. At the end of exam the scope was withdrawn without incident. Impression and Plan as noted below.   I was able to vicualize bulky adenopathy in the  lower paratracheal area approx 3x4 cm I was able to visualize the needle in real time to obtain tissue samples    TECHNICAL PROCEDURES: (Place X beside choice below)   Procedures  Description    None     Electrocautery     Cryotherapy     Balloon Dilatation     Bronchography     Stent Placement     Therapeutic Aspiration     Laser/Argon Plasma    Brachytherapy Catheter Placement    Foreign Body Removal     SPECIMENS (Sites): (Place X beside choice below)  Specimens Description   No Specimens Obtained     [place]    Lavage    Biopsies   x TRANSBRONCHIAL  Fine Needle Aspirates 6 samples   Brushings    Sputum    FINDINGS: large bulky mediastinal lower paratracheal adenopathy ESTIMATED BLOOD LOSS: none COMPLICATIONS/RESOLUTION: none       IMPRESSION:POST-PROCEDURE DX:  Abnormal findings   RECOMMENDATION/PLAN:  Follow up pathology reports    Corrin Parker, M.D.  [facility]  Medical Director [place] Director [place] Department

## 2017-02-24 NOTE — Anesthesia Postprocedure Evaluation (Signed)
Anesthesia Post Note  Patient: Nevayah Faust Ewan  Procedure(s) Performed: Procedure(s) (LRB): ENDOBRONCHIAL ULTRASOUND (N/A)  Patient location during evaluation: PACU Anesthesia Type: General Level of consciousness: awake and alert Pain management: pain level controlled Vital Signs Assessment: post-procedure vital signs reviewed and stable Respiratory status: spontaneous breathing, nonlabored ventilation, respiratory function stable and patient connected to nasal cannula oxygen Cardiovascular status: blood pressure returned to baseline and stable Postop Assessment: no signs of nausea or vomiting Anesthetic complications: no     Last Vitals:  Vitals:   02/24/17 1456 02/24/17 1524  BP: (!) 142/96 (!) 150/86  Pulse: 86 83  Resp: 13 16  Temp:      Last Pain:  Vitals:   02/24/17 1456  TempSrc:   PainSc: 0-No pain                 Precious Haws Piscitello

## 2017-02-24 NOTE — Discharge Instructions (Addendum)
Flexible Bronchoscopy, Care After This sheet gives you information about how to care for yourself after your procedure. Your health care provider may also give you more specific instructions. If you have problems or questions, contact your health care provider. What can I expect after the procedure? After the procedure, it is common to have the following symptoms for 24-48 hours:  A cough that is worse than it was before the procedure.  A low-grade fever.  A sore throat or hoarse voice.  Small streaks of blood in the mucus from your lungs (sputum), if tissue samples were removed (biopsy).  Follow these instructions at home: Eating and drinking  Do not eat or drink anything (including water) for 2 hours after your procedure, or until your numbing medicine (local anesthetic) has worn off. Having a numb throat increases your risk of burning yourself or choking.  After your numbness is gone and your cough and gag reflexes have returned, you may start eating only soft foods and slowly drinking liquids.  The day after the procedure, return to your normal diet. Driving  Do not drive for 24 hours if you were given a medicine to help you relax (sedative).  Do not drive or use heavy machinery while taking prescription pain medicine. General instructions  Take over-the-counter and prescription medicines only as told by your health care provider.  Return to your normal activities as told by your health care provider. Ask your health care provider what activities are safe for you.  Do not use any products that contain nicotine or tobacco, such as cigarettes and e-cigarettes. If you need help quitting, ask your health care provider.  Keep all follow-up visits as told by your health care provider. This is important, especially if you had a biopsy taken. Get help right away if:  You have shortness of breath that gets worse.  You become light-headed or feel like you might faint.  You have  chest pain.  You cough up more than a small amount of blood.  The amount of blood you cough up increases. Summary  Common symptoms in the 24-48 hours following a flexible bronchoscopy include cough, low-grade fever, sore throat or hoarse voice, and blood-streaked mucus from the lungs (if you had a biopsy).  Do not eat or drink anything (including water) for 2 hours after your procedure, or until your local anesthetic has worn off. You can return to your normal diet the day after the procedure.  Get help right away if you develop worsening shortness of breath, have chest pain, become light-headed, or cough up more than a small amount of blood. This information is not intended to replace advice given to you by your health care provider. Make sure you discuss any questions you have with your health care provider. Document Released: 03/05/2005 Document Revised: 09/03/2016 Document Reviewed: 09/03/2016 Elsevier Interactive Patient Education  2017 [place]   1) The drugs that you were given will stay in your system until tomorrow so for the next 24 hours you should not:  A) Drive an automobile B) Make any legal decisions C) Drink any alcoholic beverage   2) You may resume regular meals tomorrow.  Today it is better to start with liquids and gradually work up to solid foods.  You may eat anything you prefer, but it is better to start with liquids, then soup and crackers, and gradually work up to solid foods.   3) Please notify your doctor immediately if  you have any unusual bleeding, trouble breathing, redness and pain at the surgery site, drainage, fever, or pain not relieved by medication.    4) Additional Instructions:  Please contact your physician with any problems or Same Day Surgery at [phone], Monday through Friday 6 am to 4 pm, or [place] at [facility] number at [phone].

## 2017-02-24 NOTE — Anesthesia Preprocedure Evaluation (Signed)
Anesthesia Evaluation  Patient identified by MRN, date of birth, ID band Patient awake    Reviewed: Allergy & Precautions, H&P , NPO status , Patient's Chart, lab work & pertinent test results  History of Anesthesia Complications (+) PONV and history of anesthetic complications  Airway Mallampati: III  TM Distance: >3 FB Neck ROM: full    Dental  (+) Poor Dentition, Chipped   Pulmonary neg shortness of breath,           Cardiovascular Exercise Tolerance: Good hypertension, (-) angina(-) Past MI      Neuro/Psych negative neurological ROS  negative psych ROS   GI/Hepatic negative GI ROS, Neg liver ROS, neg GERD  ,  Endo/Other  diabetes, Type 2  Renal/GU      Musculoskeletal  (+) Arthritis ,   Abdominal   Peds  Hematology negative hematology ROS (+)   Anesthesia Other Findings Past Medical History: No date: Arthritis 08/06/2011: Cancer of lower-outer quadrant of female breas*     Comment: RIGHT  03/24/2012: Diabetes mellitus ([place]) No date: High cholesterol No date: History of kidney stones No date: Hx of bilateral breast implants No date: Hypertension No date: PONV (postoperative nausea and vomiting) No date: Type II diabetes mellitus ([place])     Comment: fasting 140-150 No date: Vertigo     Comment: LAST WEEK 01/2017: Vertigo  Past Surgical History: 07/2011: BREAST BIOPSY     Comment: right 03/07/2012: BREAST RECONSTRUCTION     Comment: Procedure: BREAST RECONSTRUCTION;  Surgeon:               Crissie Reese, MD;  Location: [place];  Service:               Plastics;  Laterality: Left;  BREAST               RECONSTRUCTION WITH PLACEMENT OF TISSUE               EXPANDER TO LEFT BREAST 02/06/2013: BREAST RECONSTRUCTION 6734,[id number]: CESAREAN SECTION 02/06/2013: LATISSIMUS FLAP TO BREAST Right     Comment: Procedure: LATISSIMUS FLAP TO RIGHT BREAST               WITH IMPLANT;  Surgeon: Crissie Reese, MD;       Location: [place];  Service: Plastics;                Laterality: Right; 03/07/12: MASTECTOMY     Comment: modified right; total left 03/07/2012: MODIFIED MASTECTOMY     Comment: Procedure: MODIFIED MASTECTOMY;  Surgeon:               Haywood Lasso, MD;  Location: [place];                Service: General;  Laterality: Right; 07/2011: PORTACATH PLACEMENT 03/30/2012: SCAR REVISION     Comment: Procedure: SCAR REVISION;  Surgeon: Haywood Lasso, MD;  Location: [place];  Service: General;  Laterality: Right;               CLOSURE OF MASTECTOMY INCISION No date: WISDOM TOOTH EXTRACTION     Reproductive/Obstetrics negative OB ROS                             Anesthesia Physical Anesthesia Plan  ASA: III  Anesthesia Plan: General ETT   Post-op Pain Management:    Induction: Intravenous  PONV Risk Score and Plan: 4 or greater and Ondansetron, Dexamethasone, Propofol, Midazolam and Scopolamine patch - Pre-op  Airway Management Planned: Oral ETT  Additional Equipment:   Intra-op Plan:   Post-operative Plan: Extubation in OR  Informed Consent: I have reviewed the patients History and Physical, chart, labs and discussed the procedure including the risks, benefits and alternatives for the proposed anesthesia with the patient or authorized representative who has indicated his/her understanding and acceptance.   Dental Advisory Given  Plan Discussed with: Anesthesiologist, CRNA and Surgeon  Anesthesia Plan Comments: (Patient consented for risks of anesthesia including but not limited to:  - adverse reactions to medications - damage to teeth, lips or other oral mucosa - sore throat or hoarseness - Damage to heart, brain, lungs or loss of life  Patient voiced understanding.)        Anesthesia Quick Evaluation

## 2017-02-24 NOTE — Anesthesia Procedure Notes (Signed)
Procedure Name: Intubation Date/Time: 02/24/2017 1:46 PM Performed by: Silvana Newness Pre-anesthesia Checklist: Patient identified, Emergency Drugs available, Suction available, Patient being monitored and Timeout performed Patient Re-evaluated:Patient Re-evaluated prior to inductionOxygen Delivery Method: Circle system utilized Preoxygenation: Pre-oxygenation with 100% oxygen Intubation Type: IV induction Ventilation: Mask ventilation without difficulty Laryngoscope Size: Mac and 3 Grade View: Grade I Tube type: Oral Tube size: 8.0 mm Number of attempts: 1 Airway Equipment and Method: Stylet Placement Confirmation: ETT inserted through vocal cords under direct vision,  positive ETCO2 and breath sounds checked- equal and bilateral Secured at: 21 cm Tube secured with: Tape Dental Injury: Teeth and Oropharynx as per pre-operative assessment

## 2017-02-24 NOTE — Interval H&P Note (Signed)
History and Physical Interval Note:  02/24/2017 1:13 PM  Kimberly [name]  has presented today for surgery, with the diagnosis of mediastinal adenopathy  The various methods of treatment have been discussed with the patient and family. After consideration of risks, benefits and other options for treatment, the patient has consented to  Procedure(s): ENDOBRONCHIAL ULTRASOUND (N/A) as a surgical intervention .  The patient's history has been reviewed, patient examined, no change in status, stable for surgery.  I have reviewed the patient's chart and labs.  Questions were answered to the patient's satisfaction.     [name]

## 2017-02-24 NOTE — Transfer of Care (Signed)
Immediate Anesthesia Transfer of Care Note  Patient: Kimberly [name]  Procedure(s) Performed: Procedure(s): ENDOBRONCHIAL ULTRASOUND (N/A)  Patient Location: PACU  Anesthesia Type:General  Level of Consciousness: awake, oriented, drowsy and patient cooperative  Airway & Oxygen Therapy: Patient Spontanous Breathing and Patient connected to face mask oxygen  Post-op Assessment: Report given to RN, Post -op Vital signs reviewed and stable and Patient moving all extremities X 4  Post vital signs: Reviewed and stable  Last Vitals:  Vitals:   02/24/17 1256  BP: 130/84  Pulse: 96  Resp: 18  Temp: 36.8 C    Last Pain:  Vitals:   02/24/17 1256  TempSrc: Oral         Complications: No apparent anesthesia complications

## 2017-02-25 ENCOUNTER — Encounter: Payer: Self-pay | Admitting: Internal Medicine

## 2017-03-01 ENCOUNTER — Telehealth: Payer: Self-pay | Admitting: *Deleted

## 2017-03-01 ENCOUNTER — Ambulatory Visit: Payer: BLUE CROSS/BLUE SHIELD | Admitting: Internal Medicine

## 2017-03-01 ENCOUNTER — Other Ambulatory Visit: Payer: Self-pay | Admitting: Pathology

## 2017-03-01 NOTE — Telephone Encounter (Signed)
-----   Message from Secundino Ginger sent at 03/01/2017  4:05 PM EDT ----- Regarding: bx results Contact: [phone] Pt said Dr B was supposed to call her today with BX results. She wondered if he could still call her. Number above.

## 2017-03-01 NOTE — Telephone Encounter (Signed)
Ph call returned to patient. RN and md spoke with patient. She is currently out of town and will not be available for f/u until Tuesday July 10 at 9:30am to further discuss treatment plan.

## 2017-03-03 ENCOUNTER — Other Ambulatory Visit: Payer: Self-pay

## 2017-03-08 ENCOUNTER — Telehealth: Payer: Self-pay | Admitting: *Deleted

## 2017-03-08 ENCOUNTER — Inpatient Hospital Stay: Payer: BLUE CROSS/BLUE SHIELD | Attending: Internal Medicine | Admitting: Internal Medicine

## 2017-03-08 ENCOUNTER — Inpatient Hospital Stay: Payer: BLUE CROSS/BLUE SHIELD

## 2017-03-08 VITALS — BP 131/84 | HR 98 | Temp 97.8°F | Wt 175.5 lb

## 2017-03-08 DIAGNOSIS — R509 Fever, unspecified: Secondary | ICD-10-CM | POA: Diagnosis not present

## 2017-03-08 DIAGNOSIS — I10 Essential (primary) hypertension: Secondary | ICD-10-CM | POA: Diagnosis not present

## 2017-03-08 DIAGNOSIS — Z87442 Personal history of urinary calculi: Secondary | ICD-10-CM

## 2017-03-08 DIAGNOSIS — Z7984 Long term (current) use of oral hypoglycemic drugs: Secondary | ICD-10-CM

## 2017-03-08 DIAGNOSIS — C50511 Malignant neoplasm of lower-outer quadrant of right female breast: Secondary | ICD-10-CM

## 2017-03-08 DIAGNOSIS — R978 Other abnormal tumor markers: Secondary | ICD-10-CM | POA: Diagnosis not present

## 2017-03-08 DIAGNOSIS — Z79818 Long term (current) use of other agents affecting estrogen receptors and estrogen levels: Secondary | ICD-10-CM

## 2017-03-08 DIAGNOSIS — R05 Cough: Secondary | ICD-10-CM | POA: Diagnosis not present

## 2017-03-08 DIAGNOSIS — Z9221 Personal history of antineoplastic chemotherapy: Secondary | ICD-10-CM | POA: Diagnosis not present

## 2017-03-08 DIAGNOSIS — R197 Diarrhea, unspecified: Secondary | ICD-10-CM | POA: Diagnosis not present

## 2017-03-08 DIAGNOSIS — E119 Type 2 diabetes mellitus without complications: Secondary | ICD-10-CM

## 2017-03-08 DIAGNOSIS — R42 Dizziness and giddiness: Secondary | ICD-10-CM | POA: Insufficient documentation

## 2017-03-08 DIAGNOSIS — Z803 Family history of malignant neoplasm of breast: Secondary | ICD-10-CM | POA: Diagnosis not present

## 2017-03-08 DIAGNOSIS — E7800 Pure hypercholesterolemia, unspecified: Secondary | ICD-10-CM

## 2017-03-08 DIAGNOSIS — Z923 Personal history of irradiation: Secondary | ICD-10-CM

## 2017-03-08 DIAGNOSIS — Z170 Estrogen receptor positive status [ER+]: Secondary | ICD-10-CM | POA: Diagnosis not present

## 2017-03-08 DIAGNOSIS — Z79899 Other long term (current) drug therapy: Secondary | ICD-10-CM

## 2017-03-08 LAB — BASIC METABOLIC PANEL
ANION GAP: 11 (ref 5–15)
BUN: 15 mg/dL (ref 6–20)
CHLORIDE: 94 mmol/L — AB (ref 101–111)
CO2: 25 mmol/L (ref 22–32)
Calcium: 10.1 mg/dL (ref 8.9–10.3)
Creatinine, Ser: 1 mg/dL (ref 0.44–1.00)
GFR calc Af Amer: >60 mL/min (ref >60)
GFR calc non Af Amer: 59 mL/min — ABNORMAL LOW (ref >60)
GLUCOSE: 372 mg/dL — AB (ref 65–99)
POTASSIUM: 4.8 mmol/L (ref 3.5–5.1)
SODIUM: 130 mmol/L — AB (ref 135–145)

## 2017-03-08 LAB — CBC WITH DIFFERENTIAL/PLATELET
BASOS ABS: 0.1 10*3/uL (ref 0–0.1)
Basophils Relative: 1 %
Eosinophils Absolute: 0.1 10*3/uL (ref 0–0.7)
Eosinophils Relative: 1 %
HCT: 36.3 % (ref 35.0–47.0)
HEMOGLOBIN: 12.3 g/dL (ref 12.0–16.0)
LYMPHS ABS: 1.5 10*3/uL (ref 1.0–3.6)
LYMPHS PCT: 17 %
MCH: 28.9 pg (ref 26.0–34.0)
MCHC: 33.8 g/dL (ref 32.0–36.0)
MCV: 85.3 fL (ref 80.0–100.0)
Monocytes Absolute: 0.6 10*3/uL (ref 0.2–0.9)
Monocytes Relative: 7 %
NEUTROS PCT: 74 %
Neutro Abs: 6.8 10*3/uL — ABNORMAL HIGH (ref 1.4–6.5)
Platelets: 390 10*3/uL (ref 150–440)
RBC: 4.26 MIL/uL (ref 3.80–5.20)
RDW: 14.3 % (ref 11.5–14.5)
WBC: 9.1 10*3/uL (ref 3.6–11.0)

## 2017-03-08 MED ORDER — ABEMACICLIB 150 MG PO TABS: 150.0000 mg | ORAL_TABLET | Freq: Two times a day (BID) | ORAL | 6 refills | Status: DC

## 2017-03-08 NOTE — Progress Notes (Signed)
[place] OFFICE PROGRESS NOTE  Patient Care Team: Karen Kitchens, MD as PCP - General (Family Medicine) Neldon Mc, MD (General Surgery) Everlene Farrier, MD (Obstetrics and Gynecology) Noreene Filbert, MD Forest Gleason, MD (Unknown Physician Specialty)  Cancer Staging Carcinoma of lower-outer quadrant of right breast in female, estrogen receptor positive [facility]) Staging form: Breast, AJCC 7th Edition - Pathologic: ypT1c,ypN2a, MX - Signed by Haywood Lasso, MD on 03/10/2012    Oncology History   # DEC 2012- RIGHT BREAST CA T2 N2 M0 tumor from biopsy.  Estrogen receptor positive, Progesterone receptor positive.  Current receptor negative by FISH 2. Neoadjuvant chemotherapy started in December of 28 with Cytoxan Adriamycin 3. Started on Taxol weekly chemotherapy. 4. Patient finished 12  cycles of Taxol chemotherapy in May of 2013.     5. Status post right modified radical mastectomy [Dr.Bowers; [place]] June of 2013, ypT1c  yp N2  MO. started also on Lerazole    7. Radiation therapy to the right breast (September of 2013).  Lymph node was positive for HER-2 receptor gene amplification of 2.22.  Will proceed with Herceptin treatment starting in September of 2013.   8.Patient has finished Herceptin (maintenance therapy) in August of 2014 8. Start patient on letrozole from November, 2013. 9. Patient started on Herceptin in September 2013.   10Patient finished 12 months of Herceptin therapy on August, 2014  # 6. Status post left side prophylactic mastectomy.  #Late MAY 2018-RECURRENCE BREAST CA- ER POS; PR-NEG; Her-Pending  [elevated Tumor marker- CT/PET- uptake in Right Mediastinal LN; Sternum [June 2018 EBUS- Dr.Kasa]      Carcinoma of lower-outer quadrant of right breast in female, estrogen receptor positive ([place])    INTERVAL HISTORY:  Kimberly [name] 61 y.o.  female pleasant patient above history of Breast cancer is here for follow-up. In the  interim patient was found to have elevated tumor marker; had a CT scan followed with PET scan that showed- positive mediastinal adenopathy. Patient went on to have a biopsy of the mediastinal lymph node. She is here to review the pathology/treatment plan.  Patient interim had episode of cough fever- fevers and cough are improved. However noted to have episodes of vertigo. Denies any headaches. No falls.   She denies any new lumps or bumps. Appetite is good. No bone pain.   REVIEW OF SYSTEMS:  A complete 10 point review of system is done which is negative except mentioned above/history of present illness.   PAST MEDICAL HISTORY :  Past Medical History:  Diagnosis Date  . Arthritis   . Cancer of lower-outer quadrant of female breast ([place]) 08/06/2011   RIGHT   . Diabetes mellitus ([place]) 03/24/2012  . High cholesterol   . History of kidney stones   . Hx of bilateral breast implants   . Hypertension   . PONV (postoperative nausea and vomiting)   . Type II diabetes mellitus ([facility])    fasting 140-150  . Vertigo    LAST WEEK  . Vertigo 01/2017    PAST SURGICAL HISTORY :   Past Surgical History:  Procedure Laterality Date  . BREAST BIOPSY  07/2011   right  . BREAST RECONSTRUCTION  03/07/2012   Procedure: BREAST RECONSTRUCTION;  Surgeon: Crissie Reese, MD;  Location: [facility];  Service: Plastics;  Laterality: Left;  BREAST RECONSTRUCTION WITH PLACEMENT OF TISSUE EXPANDER TO LEFT BREAST  . BREAST RECONSTRUCTION  02/06/2013  . CESAREAN SECTION  [id number]  . ENDOBRONCHIAL ULTRASOUND N/A 02/24/2017  Procedure: ENDOBRONCHIAL ULTRASOUND;  Surgeon: [name], MD;  Location: [facility];  Service: Cardiopulmonary;  Laterality: N/A;  . LATISSIMUS FLAP TO BREAST Right 02/06/2013   Procedure: LATISSIMUS FLAP TO RIGHT BREAST WITH IMPLANT;  Surgeon: Crissie Reese, MD;  Location: [facility] [facility];  Service: Plastics;  Laterality: Right;  . MASTECTOMY  03/07/12   modified right; total left  . MODIFIED MASTECTOMY  03/07/2012    Procedure: MODIFIED MASTECTOMY;  Surgeon: Haywood Lasso, MD;  Location: [facility];  Service: General;  Laterality: Right;  . PORTACATH PLACEMENT  07/2011  . SCAR REVISION  03/30/2012   Procedure: SCAR REVISION;  Surgeon: Haywood Lasso, MD;  Location: [place] [facility];  Service: General;  Laterality: Right;  CLOSURE OF MASTECTOMY INCISION  . WISDOM TOOTH EXTRACTION      FAMILY HISTORY :   Family History  Problem Relation Age of Onset  . Cancer Mother        breast    SOCIAL HISTORY:   Social History  Substance Use Topics  . Smoking status: Never Smoker  . Smokeless tobacco: Never Used  . Alcohol use No    ALLERGIES:  is allergic to sulfa antibiotics.  MEDICATIONS:  Current Outpatient Prescriptions  Medication Sig Dispense Refill  . atorvastatin (LIPITOR) 20 MG tablet Take 20 mg by mouth every morning.    . Calcium Carbonate-Vitamin D (CALCIUM 600 + D PO) Take 1 tablet by mouth 2 (two) times daily.    Marland Kitchen glipiZIDE (GLUCOTROL) 10 MG tablet Take 10 mg by mouth daily before breakfast.    . KOMBIGLYZE XR 2.12-998 MG TB24 Take 1 tablet by mouth 2 (two) times daily.  99  . letrozole (FEMARA) 2.5 MG tablet TAKE 1 TABLET (2.5 MG TOTAL) BY MOUTH DAILY. (Patient taking differently: Take 2.5 mg by mouth every evening. ) 90 tablet 4  . lisinopril (PRINIVIL,ZESTRIL) 10 MG tablet Take 10 mg by mouth every morning.     . Abemaciclib 150 MG TABS Take 150 mg by mouth every 12 (twelve) hours. DO NOT START UNTIL EVALUATED OKAY by MD. 60 tablet 6   No current facility-administered medications for this visit.     PHYSICAL EXAMINATION: ECOG PERFORMANCE STATUS: 0 - Asymptomatic  BP 131/84 (BP Location: Right Arm, Patient Position: Sitting)   Pulse 98   Temp 97.8 F (36.6 C) (Tympanic)   Wt 175 lb 7.8 oz (79.6 kg)   BMI 32.10 kg/m   Filed Weights   03/08/17 0945  Weight: 175 lb 7.8 oz (79.6 kg)    GENERAL: Well-nourished well-developed; Alert, no distress and comfortable.    Accompanied by her husband. EYES: no pallor or icterus OROPHARYNX: no thrush or ulceration; good dentition  NECK: supple, no masses felt LYMPH:  no palpable lymphadenopathy in the cervical, axillary or inguinal regions LUNGS: clear to auscultation and  No wheeze or crackles HEART/CVS: regular rate & rhythm and no murmurs; No lower extremity edema ABDOMEN:abdomen soft, non-tender and normal bowel sounds Musculoskeletal:no cyanosis of digits and no clubbing  PSYCH: alert & oriented x 3 with fluent speech NEURO: no focal motor/sensory deficits SKIN:  no rashes or significant lesions Bilateral mastectomy incisions noted. Well-healing. No lumps or bumps noted.  LABORATORY DATA:  I have reviewed the data as listed    Component Value Date/Time   NA 130 (L) 03/08/2017 1049   NA 135 11/28/2014 1401   K 4.8 03/08/2017 1049   K 4.7 11/28/2014 1401   CL 94 (L) 03/08/2017 1049  CL 99 (L) 11/28/2014 1401   CO2 25 03/08/2017 1049   CO2 28 11/28/2014 1401   GLUCOSE 372 (H) 03/08/2017 1049   GLUCOSE 215 (H) 11/28/2014 1401   BUN 15 03/08/2017 1049   BUN 16 11/28/2014 1401   CREATININE 1.00 03/08/2017 1049   CREATININE 0.96 11/28/2014 1401   CALCIUM 10.1 03/08/2017 1049   CALCIUM 9.9 11/28/2014 1401   PROT 7.2 01/04/2017 0822   PROT 7.6 11/28/2014 1401   ALBUMIN 3.8 01/04/2017 0822   ALBUMIN 4.5 11/28/2014 1401   AST 26 01/04/2017 0822   AST 26 11/28/2014 1401   ALT 23 01/04/2017 0822   ALT 28 11/28/2014 1401   ALKPHOS 70 01/04/2017 0822   ALKPHOS 78 11/28/2014 1401   BILITOT 0.4 01/04/2017 0822   BILITOT 0.5 11/28/2014 1401   GFRNONAA 59 (L) 03/08/2017 1049   GFRNONAA >60 11/28/2014 1401   GFRAA >60 03/08/2017 1049   GFRAA >60 11/28/2014 1401    No results found for: SPEP, UPEP  Lab Results  Component Value Date   WBC 9.1 03/08/2017   NEUTROABS 6.8 (H) 03/08/2017   HGB 12.3 03/08/2017   HCT 36.3 03/08/2017   MCV 85.3 03/08/2017   PLT 390 03/08/2017      Chemistry       Component Value Date/Time   NA 130 (L) 03/08/2017 1049   NA 135 11/28/2014 1401   K 4.8 03/08/2017 1049   K 4.7 11/28/2014 1401   CL 94 (L) 03/08/2017 1049   CL 99 (L) 11/28/2014 1401   CO2 25 03/08/2017 1049   CO2 28 11/28/2014 1401   BUN 15 03/08/2017 1049   BUN 16 11/28/2014 1401   CREATININE 1.00 03/08/2017 1049   CREATININE 0.96 11/28/2014 1401      Component Value Date/Time   CALCIUM 10.1 03/08/2017 1049   CALCIUM 9.9 11/28/2014 1401   ALKPHOS 70 01/04/2017 0822   ALKPHOS 78 11/28/2014 1401   AST 26 01/04/2017 0822   AST 26 11/28/2014 1401   ALT 23 01/04/2017 0822   ALT 28 11/28/2014 1401   BILITOT 0.4 01/04/2017 0822   BILITOT 0.5 11/28/2014 1401       RADIOGRAPHIC STUDIES: I have personally reviewed the radiological images as listed and agreed with the findings in the report. No results found.   ASSESSMENT & PLAN:  Carcinoma of lower-outer quadrant of right breast in female, estrogen receptor positive ([place]) Recurrent/metastatic ER positive/PR negative HER-2/neu pending breast cancer [biopsy-proven-mediastinal lymph node]. STAGE IV.   # Long discussion with the patient and family regarding the serious concerns of diagnosis of metastatic breast cancer. However patient has oligo-metastatic disease to the mediastinal lymph nodes/small sternal lesion. She understands that her treatments are palliative; not curative; and usually indefinite.   # With regards to systemic therapy- would recommend Faslodex + Abemaciclib. Discussed the potential side effects of Fosamax including but not limited to hot flashes/injection site irritation. Also discussed potential mechanism of action of CDK inhibitor- including but not limited to diarrhea. New prescription was written. Patient was started only after seeing by me at next visit.   # Given the solitary bone lesion- I would recommend given his labs every 3 months. Will discuss at next visit.  # vertigo- ? Viral infection versus  others. Given the history of breast cancer- recommend MRI of the brain ASAP  # fever/ cough- ? Viral- if not improved. Recommend letting us know- if call in anti-biotics.  # Follow-up in approximately 1 week; Faslodex  is ASAP. Labs today.    Orders Placed This Encounter  Procedures  . MR BRAIN W WO CONTRAST    Standing Status:   Future    Standing Expiration Date:   05/08/2018    Order Specific Question:   Reason for Exam (SYMPTOM  OR DIAGNOSIS REQUIRED)    Answer:   vertigo; metastatic breast cancer    Order Specific Question:   Preferred imaging location?    Answer:   [facility] (table limit-350lbs)    Order Specific Question:   Does the patient have a pacemaker or implanted devices?    Answer:   No    Order Specific Question:   What is the patient's sedation requirement?    Answer:   No Sedation  . CBC with Differential/Platelet    Standing Status:   Future    Number of Occurrences:   1    Standing Expiration Date:   03/08/2018  . Basic metabolic panel    Standing Status:   Future    Number of Occurrences:   1    Standing Expiration Date:   03/08/2018  . Cancer antigen 27.29    Standing Status:   Future    Number of Occurrences:   1    Standing Expiration Date:   03/08/2018   All questions were answered. The patient knows to call the clinic with any problems, questions or concerns.      [name], MD 03/08/2017 4:01 PM

## 2017-03-08 NOTE — Progress Notes (Signed)
Patient here today for follow up.  Patient c/o nausea & vomiting, dizziness started 6 days ago

## 2017-03-08 NOTE — Telephone Encounter (Signed)
Contacted pt - Patient informed of her blood sugar levels were elevated today. Pt stated that she "not surprised. I held my diabetes medications for a few days because I wasn't feeling very well. I'm going to let my primary care doctor know."

## 2017-03-08 NOTE — Telephone Encounter (Signed)
-----   Message from [name], MD sent at 03/08/2017  1:25 PM EDT ----- Please inform patient that her blood sugars are elevated at 372- recommend follow up with PCP for better control of blood sugars.

## 2017-03-08 NOTE — Assessment & Plan Note (Addendum)
Recurrent/metastatic ER positive/PR negative HER-2/neu pending breast cancer [biopsy-proven-mediastinal lymph node]. STAGE IV.   # Long discussion with the patient and family regarding the serious concerns of diagnosis of metastatic breast cancer. However patient has oligo-metastatic disease to the mediastinal lymph nodes/small sternal lesion. She understands that her treatments are palliative; not curative; and usually indefinite.   # With regards to systemic therapy- would recommend Faslodex + Abemaciclib. Discussed the potential side effects of Fosamax including but not limited to hot flashes/injection site irritation. Also discussed potential mechanism of action of CDK inhibitor- including but not limited to diarrhea. New prescription was written. Patient was started only after seeing by me at next visit.   # Given the solitary bone lesion- I would recommend given his labs every 3 months. Will discuss at next visit.  # vertigo- ? Viral infection versus others. Given the history of breast cancer- recommend MRI of the brain ASAP  # fever/ cough- ? Viral- if not improved. Recommend letting us know- if call in anti-biotics.  # Follow-up in approximately 1 week; Faslodex is ASAP. Labs today.

## 2017-03-08 NOTE — Progress Notes (Signed)
START ON PATHWAY REGIMEN - Breast     A cycle is every 28 days:     Fulvestrant      Fulvestrant   **Always confirm dose/schedule in your pharmacy ordering system**    Patient Characteristics: Metastatic Hormonal, Postmenopausal, Second Line, Aromatase Inhibitor Alone in First Line Therapeutic Status: Distant Metastases BRCA Mutation Status: Awaiting Test Results ER Status: Positive (+) HER2 Status: Quantity Not Sufficient Would you be surprised if this patient died  in the next year? I would be surprised if this patient died in the next year PR Status: Negative (-) Menopausal Status: Postmenopausal Line of therapy: Second Line Previous Therapy: Aromatase Inhibitor Alone in First Line  Intent of Therapy: Non-Curative / Palliative Intent, Discussed with Patient

## 2017-03-09 LAB — CANCER ANTIGEN 27.29: CA 27.29: 47.3 U/mL — ABNORMAL HIGH (ref 0.0–38.6)

## 2017-03-10 ENCOUNTER — Inpatient Hospital Stay: Payer: BLUE CROSS/BLUE SHIELD

## 2017-03-10 ENCOUNTER — Telehealth: Payer: Self-pay | Admitting: *Deleted

## 2017-03-10 ENCOUNTER — Ambulatory Visit
Admission: RE | Admit: 2017-03-10 | Discharge: 2017-03-10 | Disposition: A | Discharge status: Home / Self Care | Payer: BLUE CROSS/BLUE SHIELD | Source: Ambulatory Visit | Attending: Internal Medicine | Admitting: Internal Medicine

## 2017-03-10 VITALS — BP 130/80 | HR 97 | Temp 97.2°F | Resp 16

## 2017-03-10 DIAGNOSIS — C50511 Malignant neoplasm of lower-outer quadrant of right female breast: Secondary | ICD-10-CM

## 2017-03-10 DIAGNOSIS — R42 Dizziness and giddiness: Secondary | ICD-10-CM | POA: Insufficient documentation

## 2017-03-10 DIAGNOSIS — Z170 Estrogen receptor positive status [ER+]: Secondary | ICD-10-CM | POA: Diagnosis present

## 2017-03-10 MED ORDER — GADOBENATE DIMEGLUMINE 529 MG/ML IV SOLN
15.0000 mL | Freq: Once | INTRAVENOUS | Status: AC | PRN
  Administered 2017-03-10: 15 mL via INTRAVENOUS

## 2017-03-10 MED ORDER — FULVESTRANT 250 MG/5ML IM SOLN
500.0000 mg | Freq: Once | INTRAMUSCULAR | Status: AC
  Administered 2017-03-10: 500 mg via INTRAMUSCULAR

## 2017-03-10 NOTE — Telephone Encounter (Signed)
Pt returned phone call to RN. Discussed plan of care with patient. She gave verbal understanding

## 2017-03-10 NOTE — Telephone Encounter (Signed)
New rx for abemacilib sent to [facility] via fax at 1205 pm. Left msg for patient to contact rn. Will need to discuss with pt: regarding oral chemo/biologics class for pt drug teaching/consents.  msg sent to sch. Team to sch. Oral teaching class.

## 2017-03-10 NOTE — Patient Instructions (Signed)

## 2017-03-11 ENCOUNTER — Telehealth: Payer: Self-pay | Admitting: *Deleted

## 2017-03-11 NOTE — Telephone Encounter (Signed)
-----   Message from [name], MD sent at 03/10/2017  3:26 PM EDT ----- Please inform pt that MRI brain is negative for cancer. Thx

## 2017-03-11 NOTE — Telephone Encounter (Signed)
Patient informed - for mri results. Pt inquired about an update on the abemaciclib. I explained to her that [facility] may take a few days for insurance approval. I will update the patient as soon as more information is available.

## 2017-03-14 ENCOUNTER — Telehealth: Payer: Self-pay | Admitting: *Deleted

## 2017-03-14 NOTE — Telephone Encounter (Signed)
Needs status of Her 2 and HR to proceed with PA. Please return call

## 2017-03-14 NOTE — Telephone Encounter (Signed)
Returned phone call to [place] is presently initiating a prior [place].

## 2017-03-14 NOTE — Telephone Encounter (Signed)
Call returned to [facility]. Pt is ER POS; PR-NEG; Her 2 neg.

## 2017-03-14 NOTE — Telephone Encounter (Signed)
Betti Cruz, RN  Sabino Gasser, RN      Previous Messages    ----- Message -----  From: Kimberly [name]  Sent: 03/14/2017  8:26 AM  To: Betti Cruz, RN  Subject: PA                        [facility] [phone]) called Saturday about Prior Auth for [name] DOB 2055/09/25- thx

## 2017-03-15 ENCOUNTER — Inpatient Hospital Stay (HOSPITAL_BASED_OUTPATIENT_CLINIC_OR_DEPARTMENT_OTHER): Payer: BLUE CROSS/BLUE SHIELD | Admitting: Internal Medicine

## 2017-03-15 VITALS — BP 157/94 | HR 87 | Temp 97.8°F | Resp 20 | Ht 62.0 in | Wt 179.0 lb

## 2017-03-15 DIAGNOSIS — R509 Fever, unspecified: Secondary | ICD-10-CM | POA: Diagnosis not present

## 2017-03-15 DIAGNOSIS — I10 Essential (primary) hypertension: Secondary | ICD-10-CM

## 2017-03-15 DIAGNOSIS — R05 Cough: Secondary | ICD-10-CM | POA: Diagnosis not present

## 2017-03-15 DIAGNOSIS — E7800 Pure hypercholesterolemia, unspecified: Secondary | ICD-10-CM | POA: Diagnosis not present

## 2017-03-15 DIAGNOSIS — Z170 Estrogen receptor positive status [ER+]: Secondary | ICD-10-CM | POA: Diagnosis not present

## 2017-03-15 DIAGNOSIS — R42 Dizziness and giddiness: Secondary | ICD-10-CM | POA: Diagnosis not present

## 2017-03-15 DIAGNOSIS — Z79899 Other long term (current) drug therapy: Secondary | ICD-10-CM

## 2017-03-15 DIAGNOSIS — E119 Type 2 diabetes mellitus without complications: Secondary | ICD-10-CM | POA: Diagnosis not present

## 2017-03-15 DIAGNOSIS — R978 Other abnormal tumor markers: Secondary | ICD-10-CM | POA: Diagnosis not present

## 2017-03-15 DIAGNOSIS — Z79818 Long term (current) use of other agents affecting estrogen receptors and estrogen levels: Secondary | ICD-10-CM

## 2017-03-15 DIAGNOSIS — R197 Diarrhea, unspecified: Secondary | ICD-10-CM | POA: Diagnosis not present

## 2017-03-15 DIAGNOSIS — C50511 Malignant neoplasm of lower-outer quadrant of right female breast: Secondary | ICD-10-CM | POA: Diagnosis not present

## 2017-03-15 DIAGNOSIS — Z923 Personal history of irradiation: Secondary | ICD-10-CM

## 2017-03-15 DIAGNOSIS — Z9221 Personal history of antineoplastic chemotherapy: Secondary | ICD-10-CM | POA: Diagnosis not present

## 2017-03-15 DIAGNOSIS — Z7984 Long term (current) use of oral hypoglycemic drugs: Secondary | ICD-10-CM

## 2017-03-15 DIAGNOSIS — Z87442 Personal history of urinary calculi: Secondary | ICD-10-CM

## 2017-03-15 DIAGNOSIS — Z803 Family history of malignant neoplasm of breast: Secondary | ICD-10-CM

## 2017-03-15 NOTE — Progress Notes (Signed)
Patient has 3+ loose stools per day. Would like to discuss implementing lomotil. She is taking immodium ad as directed.

## 2017-03-15 NOTE — Assessment & Plan Note (Addendum)
Recurrent/metastatic ER positive/PR negative HER-2/neu-? breast cancer [biopsy-proven-mediastinal lymph node]. STAGE IV.   # Patient currently on Faslodex; awaiting approval of Abema.   # I had a long discussion the patient given the concerns of diarrhea from Abema; regarding the use of ibrance. Discussed that the 2 drugs are fairly efficacious; however diarrhea less the concern with ibrance. Discussed the possibility of bone marrow suppression neutropenia that ibrance.    # After lengthy discussion patient wants to wait on ibrance for now. Recommend starting abema at 1 pill a day [given significant diarrhea.]  # Discussed that radiation would be a possibility if patient is symptomatic for local control. Surgery is not an option. Role of radiotherapy could be reevaluated with restaging scans. At progression- would recommend HER-2/neu testing.   # Diarrhea- 3-5 loose stools/ "many years" [? Sec metformin- [place]]. Recommend following up with PCP regarding change of medication. She does.  # Given the solitary bone lesion- I would recommend given his labs every 3 months. Will discuss at next visit.  # vertigo- brain MRI negative for any metastases.   #  Faslodex July 26th; follow-up with me #August 14  14th; faslodex/labs.

## 2017-03-15 NOTE — Progress Notes (Signed)
[place] OFFICE PROGRESS NOTE  Patient Care Team: Karen Kitchens, MD as PCP - General (Family Medicine) Neldon Mc, MD (General Surgery) Everlene Farrier, MD (Obstetrics and Gynecology) Noreene Filbert, MD Forest Gleason, MD (Unknown Physician Specialty)  Cancer Staging Carcinoma of lower-outer quadrant of right breast in female, estrogen receptor positive [facility]) Staging form: Breast, AJCC 7th Edition - Pathologic: ypT1c,ypN2a, MX - Signed by Haywood Lasso, MD on 03/10/2012    Oncology History   # DEC 2012- RIGHT BREAST CA T2 N2 M0 tumor from biopsy.  Estrogen receptor positive, Progesterone receptor positive.  Current receptor negative by FISH 2. Neoadjuvant chemotherapy started in December of 28 with Cytoxan Adriamycin 3. Started on Taxol weekly chemotherapy. 4. Patient finished 12  cycles of Taxol chemotherapy in May of 2013.     5. Status post right modified radical mastectomy [Dr.Bowers; [place]] June of 2013, ypT1c  yp N2  MO. started also on Lerazole    7. Radiation therapy to the right breast (September of 2013).  Lymph node was positive for HER-2 receptor gene amplification of 2.22.  Will proceed with Herceptin treatment starting in September of 2013.   8.Patient has finished Herceptin (maintenance therapy) in August of 2014 8. Start patient on letrozole from November, 2013. 9. Patient started on Herceptin in September 2013.   10Patient finished 12 months of Herceptin therapy on August, 2014  # 6. Status post left side prophylactic mastectomy.  #Late MAY 2018-RECURRENCE BREAST CA- ER POS; PR-NEG; Her-Pending  [elevated Tumor marker- CT/PET- uptake in Right Mediastinal LN; Sternum [June 2018 EBUS- Dr.Kasa]      Carcinoma of lower-outer quadrant of right breast in female, estrogen receptor positive ([place])    INTERVAL HISTORY:  Brittanni Cariker Keeran 61 y.o.  female pleasant patient above history of Recently diagnosed stage IV breast cancer  ER/PR positive ? HER-2/neu- is here for follow-up.  In the interim patient was started on Faslodex last week.  Patient states her diarrhea has gotten worse in the last few weeks. Patient has up to 2-5 loose stools a day for many years. She attributes this to her antidiabetic medication- metformin-sita combination.   She denies any new lumps or bumps. Appetite is good. No bone pain. Episodes of vertigo has improved. Patient still has been not able to access abema.   REVIEW OF SYSTEMS:  A complete 10 point review of system is done which is negative except mentioned above/history of present illness.   PAST MEDICAL HISTORY :  Past Medical History:  Diagnosis Date  . Arthritis   . Cancer of lower-outer quadrant of female breast ([place]) 08/06/2011   RIGHT   . Diabetes mellitus ([place]) 03/24/2012  . High cholesterol   . History of kidney stones   . Hx of bilateral breast implants   . Hypertension   . PONV (postoperative nausea and vomiting)   . Type II diabetes mellitus ([facility])    fasting 140-150  . Vertigo    LAST WEEK  . Vertigo 01/2017    PAST SURGICAL HISTORY :   Past Surgical History:  Procedure Laterality Date  . BREAST BIOPSY  07/2011   right  . BREAST RECONSTRUCTION  03/07/2012   Procedure: BREAST RECONSTRUCTION;  Surgeon: Crissie Reese, MD;  Location: [place];  Service: Plastics;  Laterality: Left;  BREAST RECONSTRUCTION WITH PLACEMENT OF TISSUE EXPANDER TO LEFT BREAST  . BREAST RECONSTRUCTION  02/06/2013  . CESAREAN SECTION  [id number]  . ENDOBRONCHIAL ULTRASOUND N/A 02/24/2017   Procedure:  ENDOBRONCHIAL ULTRASOUND;  Surgeon: [name], MD;  Location: [facility];  Service: Cardiopulmonary;  Laterality: N/A;  . LATISSIMUS FLAP TO BREAST Right 02/06/2013   Procedure: LATISSIMUS FLAP TO RIGHT BREAST WITH IMPLANT;  Surgeon: Crissie Reese, MD;  Location: [place];  Service: Plastics;  Laterality: Right;  . MASTECTOMY  03/07/12   modified right; total left  . MODIFIED MASTECTOMY  03/07/2012    Procedure: MODIFIED MASTECTOMY;  Surgeon: Haywood Lasso, MD;  Location: [place];  Service: General;  Laterality: Right;  . PORTACATH PLACEMENT  07/2011  . SCAR REVISION  03/30/2012   Procedure: SCAR REVISION;  Surgeon: Haywood Lasso, MD;  Location: [place];  Service: General;  Laterality: Right;  CLOSURE OF MASTECTOMY INCISION  . WISDOM TOOTH EXTRACTION      FAMILY HISTORY :   Family History  Problem Relation Age of Onset  . Cancer Mother        breast    SOCIAL HISTORY:   Social History  Substance Use Topics  . Smoking status: Never Smoker  . Smokeless tobacco: Never Used  . Alcohol use No    ALLERGIES:  is allergic to sulfa antibiotics.  MEDICATIONS:  Current Outpatient Prescriptions  Medication Sig Dispense Refill  . atorvastatin (LIPITOR) 20 MG tablet Take 20 mg by mouth every morning.    . Calcium Carbonate-Vitamin D (CALCIUM 600 + D PO) Take 1 tablet by mouth 2 (two) times daily.    Marland Kitchen glipiZIDE (GLUCOTROL) 10 MG tablet Take 10 mg by mouth daily before breakfast.    . KOMBIGLYZE XR 2.12-998 MG TB24 Take 1 tablet by mouth 2 (two) times daily.  99  . letrozole (FEMARA) 2.5 MG tablet TAKE 1 TABLET (2.5 MG TOTAL) BY MOUTH DAILY. (Patient taking differently: Take 2.5 mg by mouth every evening. ) 90 tablet 4  . lisinopril (PRINIVIL,ZESTRIL) 10 MG tablet Take 10 mg by mouth every morning.     . loperamide (IMODIUM A-D) 2 MG tablet Take 2 mg by mouth 4 (four) times daily as needed for diarrhea or loose stools.    . Abemaciclib 150 MG TABS Take 150 mg by mouth every 12 (twelve) hours. DO NOT START UNTIL EVALUATED OKAY by MD. (Patient not taking: Reported on 03/15/2017) 60 tablet 6   No current facility-administered medications for this visit.     PHYSICAL EXAMINATION: ECOG PERFORMANCE STATUS: 0 - Asymptomatic  BP (!) 157/94 (BP Location: Left Arm, Patient Position: Sitting)   Pulse 87   Temp 97.8 F (36.6 C) (Tympanic)   Resp 20   Ht '5\' 2"'  (1.575 m)    Wt 179 lb 0.2 oz (81.2 kg)   BMI 32.74 kg/m   Filed Weights   03/15/17 0829  Weight: 179 lb 0.2 oz (81.2 kg)    GENERAL: Well-nourished well-developed; Alert, no distress and comfortable.   Accompanied by her husband. EYES: no pallor or icterus OROPHARYNX: no thrush or ulceration; good dentition  NECK: supple, no masses felt LYMPH:  no palpable lymphadenopathy in the cervical, axillary or inguinal regions LUNGS: clear to auscultation and  No wheeze or crackles HEART/CVS: regular rate & rhythm and no murmurs; No lower extremity edema ABDOMEN:abdomen soft, non-tender and normal bowel sounds Musculoskeletal:no cyanosis of digits and no clubbing  PSYCH: alert & oriented x 3 with fluent speech NEURO: no focal motor/sensory deficits SKIN:  no rashes or significant lesions  LABORATORY DATA:  I have reviewed the data as listed    Component Value Date/Time  NA 130 (L) 03/08/2017 1049   NA 135 11/28/2014 1401   K 4.8 03/08/2017 1049   K 4.7 11/28/2014 1401   CL 94 (L) 03/08/2017 1049   CL 99 (L) 11/28/2014 1401   CO2 25 03/08/2017 1049   CO2 28 11/28/2014 1401   GLUCOSE 372 (H) 03/08/2017 1049   GLUCOSE 215 (H) 11/28/2014 1401   BUN 15 03/08/2017 1049   BUN 16 11/28/2014 1401   CREATININE 1.00 03/08/2017 1049   CREATININE 0.96 11/28/2014 1401   CALCIUM 10.1 03/08/2017 1049   CALCIUM 9.9 11/28/2014 1401   PROT 7.2 01/04/2017 0822   PROT 7.6 11/28/2014 1401   ALBUMIN 3.8 01/04/2017 0822   ALBUMIN 4.5 11/28/2014 1401   AST 26 01/04/2017 0822   AST 26 11/28/2014 1401   ALT 23 01/04/2017 0822   ALT 28 11/28/2014 1401   ALKPHOS 70 01/04/2017 0822   ALKPHOS 78 11/28/2014 1401   BILITOT 0.4 01/04/2017 0822   BILITOT 0.5 11/28/2014 1401   GFRNONAA 59 (L) 03/08/2017 1049   GFRNONAA >60 11/28/2014 1401   GFRAA >60 03/08/2017 1049   GFRAA >60 11/28/2014 1401    No results found for: SPEP, UPEP  Lab Results  Component Value Date   WBC 9.1 03/08/2017   NEUTROABS 6.8 (H)  03/08/2017   HGB 12.3 03/08/2017   HCT 36.3 03/08/2017   MCV 85.3 03/08/2017   PLT 390 03/08/2017      Chemistry      Component Value Date/Time   NA 130 (L) 03/08/2017 1049   NA 135 11/28/2014 1401   K 4.8 03/08/2017 1049   K 4.7 11/28/2014 1401   CL 94 (L) 03/08/2017 1049   CL 99 (L) 11/28/2014 1401   CO2 25 03/08/2017 1049   CO2 28 11/28/2014 1401   BUN 15 03/08/2017 1049   BUN 16 11/28/2014 1401   CREATININE 1.00 03/08/2017 1049   CREATININE 0.96 11/28/2014 1401      Component Value Date/Time   CALCIUM 10.1 03/08/2017 1049   CALCIUM 9.9 11/28/2014 1401   ALKPHOS 70 01/04/2017 0822   ALKPHOS 78 11/28/2014 1401   AST 26 01/04/2017 0822   AST 26 11/28/2014 1401   ALT 23 01/04/2017 0822   ALT 28 11/28/2014 1401   BILITOT 0.4 01/04/2017 0822   BILITOT 0.5 11/28/2014 1401       RADIOGRAPHIC STUDIES: I have personally reviewed the radiological images as listed and agreed with the findings in the report. No results found.   ASSESSMENT & PLAN:  Carcinoma of lower-outer quadrant of right breast in female, estrogen receptor positive ([place]) Recurrent/metastatic ER positive/PR negative HER-2/neu-? breast cancer [biopsy-proven-mediastinal lymph node]. STAGE IV.   # Patient currently on Faslodex; awaiting approval of Abema.   # I had a long discussion the patient given the concerns of diarrhea from Abema; regarding the use of ibrance. Discussed that the 2 drugs are fairly efficacious; however diarrhea less the concern with ibrance. Discussed the possibility of bone marrow suppression neutropenia that ibrance.    # After lengthy discussion patient wants to wait on ibrance for now. Recommend starting abema at 1 pill a day [given significant diarrhea.]  # Discussed that radiation would be a possibility if patient is symptomatic for local control. Surgery is not an option. Role of radiotherapy could be reevaluated with restaging scans. At progression- would recommend HER-2/neu  testing.   # Diarrhea- 3-5 loose stools/ "many years" [? Sec metformin- [place]]. Recommend following up with PCP regarding  change of medication. She does.  # Given the solitary bone lesion- I would recommend given his labs every 3 months. Will discuss at next visit.  # vertigo- brain MRI negative for any metastases.   #  Faslodex July 26th; follow-up with me #August 14  14th; faslodex/labs.    Orders Placed This Encounter  Procedures  . CBC with Differential/Platelet    Standing Status:   Future    Standing Expiration Date:   03/15/2018  . Basic metabolic panel    Standing Status:   Future    Standing Expiration Date:   03/15/2018   All questions were answered. The patient knows to call the clinic with any problems, questions or concerns.      [name], MD 03/15/2017 6:21 PM

## 2017-03-16 ENCOUNTER — Telehealth: Payer: Self-pay | Admitting: *Deleted

## 2017-03-16 LAB — CYTOLOGY - NON PAP

## 2017-03-16 NOTE — Telephone Encounter (Signed)
RN faxed verzenio prior review cert. To [employer] to 1 [phone]

## 2017-03-16 NOTE — Telephone Encounter (Signed)
1 [phone] - [employer] received documents for prior auth -for lynparza  ref # [id number].  Needs documentation from pathology reports to reflect her 2 status.  These results were refaxed back to [facility] for prior auth purposes.

## 2017-03-17 NOTE — Telephone Encounter (Signed)
Prior Authorization for Kimberly [name] has been approved from 03/15/17 to 03/14/18.  Ref #[id number].  Copy faxed to [employer].

## 2017-03-22 ENCOUNTER — Inpatient Hospital Stay: Payer: BLUE CROSS/BLUE SHIELD

## 2017-03-23 ENCOUNTER — Telehealth: Payer: Self-pay | Admitting: *Deleted

## 2017-03-23 NOTE — Telephone Encounter (Signed)
Pt received Abemaciclib in mail. Pt would like to know when she should start med.

## 2017-03-24 ENCOUNTER — Inpatient Hospital Stay: Payer: BLUE CROSS/BLUE SHIELD

## 2017-03-24 ENCOUNTER — Ambulatory Visit: Payer: BLUE CROSS/BLUE SHIELD

## 2017-03-24 DIAGNOSIS — Z170 Estrogen receptor positive status [ER+]: Principal | ICD-10-CM

## 2017-03-24 DIAGNOSIS — C50511 Malignant neoplasm of lower-outer quadrant of right female breast: Secondary | ICD-10-CM

## 2017-03-24 MED ORDER — FULVESTRANT 250 MG/5ML IM SOLN
500.0000 mg | Freq: Once | INTRAMUSCULAR | Status: AC
  Administered 2017-03-24: 500 mg via INTRAMUSCULAR

## 2017-03-24 NOTE — Telephone Encounter (Signed)
Patient notified of Dr. Agnes Lawrence instructions and verbalized understanding.

## 2017-03-24 NOTE — Patient Instructions (Signed)

## 2017-03-24 NOTE — Telephone Encounter (Signed)
Patient needs to start verenzio - once daily starting today. Please make sure patient has imodium available. Keep sch. Apt in August, and then md will discuss at that apt the new dosing schedule.

## 2017-04-12 ENCOUNTER — Inpatient Hospital Stay: Payer: BLUE CROSS/BLUE SHIELD | Attending: Internal Medicine | Admitting: Internal Medicine

## 2017-04-12 ENCOUNTER — Telehealth: Payer: Self-pay | Admitting: Internal Medicine

## 2017-04-12 ENCOUNTER — Inpatient Hospital Stay: Payer: BLUE CROSS/BLUE SHIELD

## 2017-04-12 ENCOUNTER — Telehealth: Payer: Self-pay | Admitting: *Deleted

## 2017-04-12 VITALS — BP 136/84 | HR 89 | Temp 96.4°F | Resp 16 | Wt 182.2 lb

## 2017-04-12 DIAGNOSIS — Z170 Estrogen receptor positive status [ER+]: Secondary | ICD-10-CM | POA: Insufficient documentation

## 2017-04-12 DIAGNOSIS — E1165 Type 2 diabetes mellitus with hyperglycemia: Secondary | ICD-10-CM | POA: Diagnosis not present

## 2017-04-12 DIAGNOSIS — R300 Dysuria: Secondary | ICD-10-CM | POA: Diagnosis not present

## 2017-04-12 DIAGNOSIS — Z803 Family history of malignant neoplasm of breast: Secondary | ICD-10-CM | POA: Diagnosis not present

## 2017-04-12 DIAGNOSIS — Z87442 Personal history of urinary calculi: Secondary | ICD-10-CM | POA: Insufficient documentation

## 2017-04-12 DIAGNOSIS — C50511 Malignant neoplasm of lower-outer quadrant of right female breast: Secondary | ICD-10-CM

## 2017-04-12 DIAGNOSIS — Z7984 Long term (current) use of oral hypoglycemic drugs: Secondary | ICD-10-CM | POA: Diagnosis not present

## 2017-04-12 DIAGNOSIS — Z79811 Long term (current) use of aromatase inhibitors: Secondary | ICD-10-CM | POA: Diagnosis not present

## 2017-04-12 DIAGNOSIS — E7800 Pure hypercholesterolemia, unspecified: Secondary | ICD-10-CM | POA: Diagnosis not present

## 2017-04-12 DIAGNOSIS — R42 Dizziness and giddiness: Secondary | ICD-10-CM | POA: Insufficient documentation

## 2017-04-12 DIAGNOSIS — I10 Essential (primary) hypertension: Secondary | ICD-10-CM | POA: Insufficient documentation

## 2017-04-12 DIAGNOSIS — R05 Cough: Secondary | ICD-10-CM | POA: Insufficient documentation

## 2017-04-12 DIAGNOSIS — Z9011 Acquired absence of right breast and nipple: Secondary | ICD-10-CM

## 2017-04-12 DIAGNOSIS — Z79899 Other long term (current) drug therapy: Secondary | ICD-10-CM | POA: Insufficient documentation

## 2017-04-12 DIAGNOSIS — R197 Diarrhea, unspecified: Secondary | ICD-10-CM | POA: Diagnosis not present

## 2017-04-12 DIAGNOSIS — M899 Disorder of bone, unspecified: Secondary | ICD-10-CM | POA: Insufficient documentation

## 2017-04-12 LAB — BASIC METABOLIC PANEL
ANION GAP: 9 (ref 5–15)
BUN: 20 mg/dL (ref 6–20)
CALCIUM: 9.5 mg/dL (ref 8.9–10.3)
CO2: 25 mmol/L (ref 22–32)
CREATININE: 1.14 mg/dL — AB (ref 0.44–1.00)
Chloride: 102 mmol/L (ref 101–111)
GFR, EST AFRICAN AMERICAN: 58 mL/min — AB (ref >60)
GFR, EST NON AFRICAN AMERICAN: 50 mL/min — AB (ref >60)
Glucose, Bld: 237 mg/dL — ABNORMAL HIGH (ref 65–99)
Potassium: 4.5 mmol/L (ref 3.5–5.1)
Sodium: 136 mmol/L (ref 135–145)

## 2017-04-12 LAB — CBC WITH DIFFERENTIAL/PLATELET
BASOS ABS: 0.1 10*3/uL (ref 0–0.1)
BASOS PCT: 1 %
EOS PCT: 5 %
Eosinophils Absolute: 0.2 10*3/uL (ref 0–0.7)
HCT: 34.8 % — ABNORMAL LOW (ref 35.0–47.0)
Hemoglobin: 11.6 g/dL — ABNORMAL LOW (ref 12.0–16.0)
Lymphocytes Relative: 38 %
Lymphs Abs: 1.6 10*3/uL (ref 1.0–3.6)
MCH: 29 pg (ref 26.0–34.0)
MCHC: 33.2 g/dL (ref 32.0–36.0)
MCV: 87.5 fL (ref 80.0–100.0)
MONO ABS: 0.2 10*3/uL (ref 0.2–0.9)
Monocytes Relative: 6 %
Neutro Abs: 2.1 10*3/uL (ref 1.4–6.5)
Neutrophils Relative %: 50 %
PLATELETS: 297 10*3/uL (ref 150–440)
RBC: 3.98 MIL/uL (ref 3.80–5.20)
RDW: 15.3 % — AB (ref 11.5–14.5)
WBC: 4.3 10*3/uL (ref 3.6–11.0)

## 2017-04-12 LAB — URINALYSIS, COMPLETE (UACMP) WITH MICROSCOPIC
Bilirubin Urine: NEGATIVE
GLUCOSE, UA: 250 mg/dL — AB
HGB URINE DIPSTICK: NEGATIVE
KETONES UR: NEGATIVE mg/dL
NITRITE: NEGATIVE
Specific Gravity, Urine: >1.03 — ABNORMAL HIGH (ref 1.005–1.030)
pH: 5.5 (ref 5.0–8.0)

## 2017-04-12 MED ORDER — CIPROFLOXACIN HCL 500 MG PO TABS: 500.0000 mg | ORAL_TABLET | Freq: Two times a day (BID) | ORAL | 0 refills | Status: DC

## 2017-04-12 MED ORDER — FULVESTRANT 250 MG/5ML IM SOLN
500.0000 mg | Freq: Once | INTRAMUSCULAR | Status: AC
  Administered 2017-04-12: 500 mg via INTRAMUSCULAR

## 2017-04-12 NOTE — Telephone Encounter (Signed)
-----   Message from Secundino Ginger sent at 04/12/2017 11:56 AM EDT ----- Regarding: medication Kimberly [name] was just here and Dr B talked to her about starting a 2nd chemo pill. She doesn't know when to start it?

## 2017-04-12 NOTE — Progress Notes (Signed)
[place] OFFICE PROGRESS NOTE  Patient Care Team: Karen Kitchens, MD as PCP - General (Family Medicine) Neldon Mc, MD (General Surgery) Everlene Farrier, MD (Obstetrics and Gynecology) Noreene Filbert, MD Forest Gleason, MD (Unknown Physician Specialty)  Cancer Staging Carcinoma of lower-outer quadrant of right breast in female, estrogen receptor positive [facility]) Staging form: Breast, AJCC 7th Edition - Pathologic: ypT1c,ypN2a, MX - Signed by Haywood Lasso, MD on 03/10/2012    Oncology History   # DEC 2012- RIGHT BREAST CA T2 N2 M0 tumor from biopsy.  Estrogen receptor positive, Progesterone receptor positive.  Current receptor negative by FISH 2. Neoadjuvant chemotherapy started in December of 28 with Cytoxan Adriamycin 3. Started on Taxol weekly chemotherapy. 4. Patient finished 12  cycles of Taxol chemotherapy in May of 2013.     5. Status post right modified radical mastectomy [Dr.Bowers; [place]] June of 2013, ypT1c  yp N2  MO. started also on Lerazole    7. Radiation therapy to the right breast (September of 2013).  Lymph node was positive for HER-2 receptor gene amplification of 2.22.  Will proceed with Herceptin treatment starting in September of 2013.   8.Patient has finished Herceptin (maintenance therapy) in August of 2014 8. Start patient on letrozole from November, 2013. 9. Patient started on Herceptin in September 2013.   10Patient finished 12 months of Herceptin therapy on August, 2014  # 6. Status post left side prophylactic mastectomy.  #Late MAY 2018-RECURRENCE BREAST CA- ER POS; PR-NEG; Her-Pending  [elevated Tumor marker- CT/PET- uptake in Right Mediastinal LN; Sternum [June 2018 EBUS- Dr.Kasa]   # March 10 2017- faslodex + Abema     Carcinoma of lower-outer quadrant of right breast in female, estrogen receptor positive ([place])    INTERVAL HISTORY:  Kimberly [name] 61 y.o.  female pleasant patient above history of Recently  diagnosed stage IV breast cancer ER/PR positive ? HER-2/neu- is here for follow-up. Patient is currently on Faslodex plus Abemaciclib.  Patient is currently on Abma- 150 mg a day [given history of chronic diarrhea-question related to diabetes medication]. Patient states she is having 1 loose stool a day; taking Imodium. Denies any nausea vomiting. Denies any fevers or chills.   She denies any new lumps or bumps. Appetite is good. No bone pain. No headaches. Patient complains of discomfort while urination. Denies any back pain. Patient complains of mild cough otherwise no difficulty breathing. Had complained of mild soreness in mouth currently resolved.  REVIEW OF SYSTEMS:  A complete 10 point review of system is done which is negative except mentioned above/history of present illness.   PAST MEDICAL HISTORY :  Past Medical History:  Diagnosis Date  . Arthritis   . Cancer of lower-outer quadrant of female breast ([place]) 08/06/2011   RIGHT   . Diabetes mellitus ([place]) 03/24/2012  . High cholesterol   . History of kidney stones   . Hx of bilateral breast implants   . Hypertension   . PONV (postoperative nausea and vomiting)   . Type II diabetes mellitus ([facility])    fasting 140-150  . Vertigo    LAST WEEK  . Vertigo 01/2017    PAST SURGICAL HISTORY :   Past Surgical History:  Procedure Laterality Date  . BREAST BIOPSY  07/2011   right  . BREAST RECONSTRUCTION  03/07/2012   Procedure: BREAST RECONSTRUCTION;  Surgeon: Crissie Reese, MD;  Location: [place];  Service: Plastics;  Laterality: Left;  BREAST RECONSTRUCTION WITH PLACEMENT OF TISSUE  EXPANDER TO LEFT BREAST  . BREAST RECONSTRUCTION  02/06/2013  . CESAREAN SECTION  [id number]  . ENDOBRONCHIAL ULTRASOUND N/A 02/24/2017   Procedure: ENDOBRONCHIAL ULTRASOUND;  Surgeon: [name], MD;  Location: [facility];  Service: Cardiopulmonary;  Laterality: N/A;  . LATISSIMUS FLAP TO BREAST Right 02/06/2013   Procedure: LATISSIMUS FLAP TO RIGHT BREAST WITH  IMPLANT;  Surgeon: Crissie Reese, MD;  Location: [facility];  Service: Plastics;  Laterality: Right;  . MASTECTOMY  03/07/12   modified right; total left  . MODIFIED MASTECTOMY  03/07/2012   Procedure: MODIFIED MASTECTOMY;  Surgeon: Haywood Lasso, MD;  Location: [place];  Service: General;  Laterality: Right;  . PORTACATH PLACEMENT  07/2011  . SCAR REVISION  03/30/2012   Procedure: SCAR REVISION;  Surgeon: Haywood Lasso, MD;  Location: [place];  Service: General;  Laterality: Right;  CLOSURE OF MASTECTOMY INCISION  . WISDOM TOOTH EXTRACTION      FAMILY HISTORY :   Family History  Problem Relation Age of Onset  . Cancer Mother        breast    SOCIAL HISTORY:   Social History  Substance Use Topics  . Smoking status: Never Smoker  . Smokeless tobacco: Never Used  . Alcohol use No    ALLERGIES:  is allergic to sulfa antibiotics.  MEDICATIONS:  Current Outpatient Prescriptions  Medication Sig Dispense Refill  . Abemaciclib 150 MG TABS Take 150 mg by mouth every 12 (twelve) hours. DO NOT START UNTIL EVALUATED OKAY by MD. (Patient taking differently: Take 150 mg by mouth every 12 (twelve) hours. Taking one tablet daily) 60 tablet 6  . atorvastatin (LIPITOR) 20 MG tablet Take 20 mg by mouth every morning.    . Calcium Carbonate-Vitamin D (CALCIUM 600 + D PO) Take 1 tablet by mouth 2 (two) times daily.    Marland Kitchen glipiZIDE (GLUCOTROL) 10 MG tablet Take 10 mg by mouth daily before breakfast.    . KOMBIGLYZE XR 2.12-998 MG TB24 Take 1 tablet by mouth 2 (two) times daily.  99  . letrozole (FEMARA) 2.5 MG tablet TAKE 1 TABLET (2.5 MG TOTAL) BY MOUTH DAILY. (Patient taking differently: Take 2.5 mg by mouth every evening. ) 90 tablet 4  . lisinopril (PRINIVIL,ZESTRIL) 10 MG tablet Take 10 mg by mouth every morning.     . loperamide (IMODIUM A-D) 2 MG tablet Take 2 mg by mouth 4 (four) times daily as needed for diarrhea or loose stools.    . ciprofloxacin (CIPRO) 500 MG tablet Take 1  tablet (500 mg total) by mouth 2 (two) times daily. 10 tablet 0   No current facility-administered medications for this visit.     PHYSICAL EXAMINATION: ECOG PERFORMANCE STATUS: 0 - Asymptomatic  BP 136/84 (BP Location: Left Arm, Patient Position: Sitting)   Pulse 89   Temp (!) 96.4 F (35.8 C) (Tympanic)   Resp 16   Wt 182 lb 3.4 oz (82.6 kg)   BMI 33.33 kg/m   Filed Weights   04/12/17 0836  Weight: 182 lb 3.4 oz (82.6 kg)    GENERAL: Well-nourished well-developed; Alert, no distress and comfortable.   Accompanied by her husband. EYES: no pallor or icterus OROPHARYNX: no thrush or ulceration; good dentition  NECK: supple, no masses felt LYMPH:  no palpable lymphadenopathy in the cervical, axillary or inguinal regions LUNGS: clear to auscultation and  No wheeze or crackles HEART/CVS: regular rate & rhythm and no murmurs; No lower extremity edema ABDOMEN:abdomen soft, non-tender  and normal bowel sounds Musculoskeletal:no cyanosis of digits and no clubbing  PSYCH: alert & oriented x 3 with fluent speech NEURO: no focal motor/sensory deficits SKIN:  no rashes or significant lesions  LABORATORY DATA:  I have reviewed the data as listed    Component Value Date/Time   NA 136 04/12/2017 0809   NA 135 11/28/2014 1401   K 4.5 04/12/2017 0809   K 4.7 11/28/2014 1401   CL 102 04/12/2017 0809   CL 99 (L) 11/28/2014 1401   CO2 25 04/12/2017 0809   CO2 28 11/28/2014 1401   GLUCOSE 237 (H) 04/12/2017 0809   GLUCOSE 215 (H) 11/28/2014 1401   BUN 20 04/12/2017 0809   BUN 16 11/28/2014 1401   CREATININE 1.14 (H) 04/12/2017 0809   CREATININE 0.96 11/28/2014 1401   CALCIUM 9.5 04/12/2017 0809   CALCIUM 9.9 11/28/2014 1401   PROT 7.2 01/04/2017 0822   PROT 7.6 11/28/2014 1401   ALBUMIN 3.8 01/04/2017 0822   ALBUMIN 4.5 11/28/2014 1401   AST 26 01/04/2017 0822   AST 26 11/28/2014 1401   ALT 23 01/04/2017 0822   ALT 28 11/28/2014 1401   ALKPHOS 70 01/04/2017 0822   ALKPHOS 78  11/28/2014 1401   BILITOT 0.4 01/04/2017 0822   BILITOT 0.5 11/28/2014 1401   GFRNONAA 50 (L) 04/12/2017 0809   GFRNONAA >60 11/28/2014 1401   GFRAA 58 (L) 04/12/2017 0809   GFRAA >60 11/28/2014 1401    No results found for: SPEP, UPEP  Lab Results  Component Value Date   WBC 4.3 04/12/2017   NEUTROABS 2.1 04/12/2017   HGB 11.6 (L) 04/12/2017   HCT 34.8 (L) 04/12/2017   MCV 87.5 04/12/2017   PLT 297 04/12/2017      Chemistry      Component Value Date/Time   NA 136 04/12/2017 0809   NA 135 11/28/2014 1401   K 4.5 04/12/2017 0809   K 4.7 11/28/2014 1401   CL 102 04/12/2017 0809   CL 99 (L) 11/28/2014 1401   CO2 25 04/12/2017 0809   CO2 28 11/28/2014 1401   BUN 20 04/12/2017 0809   BUN 16 11/28/2014 1401   CREATININE 1.14 (H) 04/12/2017 0809   CREATININE 0.96 11/28/2014 1401      Component Value Date/Time   CALCIUM 9.5 04/12/2017 0809   CALCIUM 9.9 11/28/2014 1401   ALKPHOS 70 01/04/2017 0822   ALKPHOS 78 11/28/2014 1401   AST 26 01/04/2017 0822   AST 26 11/28/2014 1401   ALT 23 01/04/2017 0822   ALT 28 11/28/2014 1401   BILITOT 0.4 01/04/2017 0822   BILITOT 0.5 11/28/2014 1401       RADIOGRAPHIC STUDIES: I have personally reviewed the radiological images as listed and agreed with the findings in the report. No results found.   ASSESSMENT & PLAN:  Carcinoma of lower-outer quadrant of right breast in female, estrogen receptor positive ([place]) STAGE IV.-Recurrent/metastatic ER positive/PR negative HER-2/neu-? breast cancer [biopsy-proven-mediastinal lymph node; in suff for her 2 tetsing].    # Patient currently on Faslodex; tolerating Abema 150 mg a day [C discussion regarding diarrhea]. Recommend taking abema twice a day starting today.   # Diarrhea- 1 loose stool a day. Currently on Imodium once a day. Not any worse.  # dysuria-check urinalysis.  # Blood sugars elevated around 250s. Even at home. Recommend talking to her PCP regarding switching  antidiabetic medication [especially the context of possible diarrhea].   # cough- question to URI versus allergies.  No concerns for lower respiratory infection. Recommend over-the-counter cough medication.   # soreness in mouth- improved.   # Given the solitary bone lesion- Monitor for now.  # faslodex today/UA.; follow up 2 weeks/labs-ca-27-29; and again in 4 weeks/faslodex/labs. I left a message for patient's primary care doctor Dr. Corinda Gubler to discuss the above.   Addendum: Urinalysis suggestive of infection; recommend Cipro 500 twice a day for 7 days. Await cultures.   Orders Placed This Encounter  Procedures  . Urine culture    Standing Status:   Future    Number of Occurrences:   1    Standing Expiration Date:   04/12/2018  . Urinalysis, Complete w Microscopic    Standing Status:   Future    Number of Occurrences:   1    Standing Expiration Date:   04/12/2018  . CBC with Differential    Standing Status:   Future    Standing Expiration Date:   04/12/2018  . Comprehensive metabolic panel    Standing Status:   Future    Standing Expiration Date:   04/12/2018  . Cancer antigen 27.29    Standing Status:   Future    Standing Expiration Date:   04/12/2018  . CBC with Differential    Standing Status:   Future    Standing Expiration Date:   04/12/2018  . Comprehensive metabolic panel    Standing Status:   Future    Standing Expiration Date:   04/12/2018  . Cancer antigen 27.29    Standing Status:   Future    Standing Expiration Date:   04/12/2018   All questions were answered. The patient knows to call the clinic with any problems, questions or concerns.      [name], MD 04/12/2017 8:58 PM

## 2017-04-12 NOTE — Telephone Encounter (Signed)
Spoke with patient - patient made aware of results of u/a and that rx for cipro sent to pharmacy.

## 2017-04-12 NOTE — Assessment & Plan Note (Addendum)
STAGE IV.-Recurrent/metastatic ER positive/PR negative HER-2/neu-? breast cancer [biopsy-proven-mediastinal lymph node; in suff for her 2 tetsing].    # Patient currently on Faslodex; tolerating Abema 150 mg a day [C discussion regarding diarrhea]. Recommend taking abema twice a day starting today.   # Diarrhea- 1 loose stool a day. Currently on Imodium once a day. Not any worse.  # dysuria-check urinalysis.  # Blood sugars elevated around 250s. Even at home. Recommend talking to her PCP regarding switching antidiabetic medication [especially the context of possible diarrhea].   # cough- question to URI versus allergies. No concerns for lower respiratory infection. Recommend over-the-counter cough medication.   # soreness in mouth- improved.   # Given the solitary bone lesion- Monitor for now.  # faslodex today/UA.; follow up 2 weeks/labs-ca-27-29; and again in 4 weeks/faslodex/labs. I left a message for patient's primary care doctor Dr. Corinda Gubler to discuss the above.   Addendum: Urinalysis suggestive of infection; recommend Cipro 500 twice a day for 7 days. Await cultures.

## 2017-04-12 NOTE — Telephone Encounter (Signed)
Patient has only been taking the abemaciclib 150 once daily. Per Dr. Rogue Bussing - patient may start on twice daily dosing today.  I spoke with patient, she normally takes her tablet at bedtime at 8pm. She will take her Abemaciclib twice daily starting at 8am tom.  Teach back process performed with patient.

## 2017-04-12 NOTE — Progress Notes (Signed)
Patient states she has had sores in mouth, but she states that she feels like this is getting better. Patient also states that she has developed urinary frequency, and feels like she may be getting a UTI.

## 2017-04-12 NOTE — Telephone Encounter (Signed)
Please inform patient that she likely has UTI. Recommend starting ciprofloxacin 500 mg 1 pill twice a day for 5 days. I have called in the prescription.  We will await for cultures; will inform patient that if anything changes.

## 2017-04-14 LAB — URINE CULTURE: Culture: >=100000 — AB

## 2017-04-25 ENCOUNTER — Inpatient Hospital Stay: Payer: BLUE CROSS/BLUE SHIELD

## 2017-04-25 ENCOUNTER — Telehealth: Payer: Self-pay | Admitting: *Deleted

## 2017-04-25 ENCOUNTER — Other Ambulatory Visit: Payer: Self-pay | Admitting: Internal Medicine

## 2017-04-25 DIAGNOSIS — C50511 Malignant neoplasm of lower-outer quadrant of right female breast: Secondary | ICD-10-CM

## 2017-04-25 DIAGNOSIS — Z170 Estrogen receptor positive status [ER+]: Principal | ICD-10-CM

## 2017-04-25 DIAGNOSIS — R350 Frequency of micturition: Secondary | ICD-10-CM

## 2017-04-25 LAB — URINALYSIS, COMPLETE (UACMP) WITH MICROSCOPIC
BACTERIA UA: NONE SEEN
BILIRUBIN URINE: NEGATIVE
Glucose, UA: NEGATIVE mg/dL
HGB URINE DIPSTICK: NEGATIVE
Ketones, ur: NEGATIVE mg/dL
NITRITE: NEGATIVE
PROTEIN: NEGATIVE mg/dL
Specific Gravity, Urine: 1.021 (ref 1.005–1.030)
pH: 5 (ref 5.0–8.0)

## 2017-04-25 NOTE — Telephone Encounter (Signed)
Patient will come in at 2:30 today

## 2017-04-25 NOTE — Telephone Encounter (Signed)
Patient states she was taking antibiotics for UTI and she thinks she needs a refill of them because she is again having urge to void and only going a little bit. Pleas advise

## 2017-04-25 NOTE — Telephone Encounter (Signed)
Per Dr. Jacinto Reap, please have the patient come in for a lab appointment only today for a urinalysis and urine culture. I have entered the orders.

## 2017-04-26 ENCOUNTER — Telehealth: Payer: Self-pay | Admitting: *Deleted

## 2017-04-26 ENCOUNTER — Other Ambulatory Visit: Payer: Self-pay | Admitting: *Deleted

## 2017-04-26 ENCOUNTER — Ambulatory Visit: Payer: BLUE CROSS/BLUE SHIELD | Admitting: Internal Medicine

## 2017-04-26 ENCOUNTER — Other Ambulatory Visit: Payer: BLUE CROSS/BLUE SHIELD

## 2017-04-26 MED ORDER — PHENAZOPYRIDINE HCL 100 MG PO TABS: 100.0000 mg | ORAL_TABLET | Freq: Three times a day (TID) | ORAL | 0 refills | Status: DC | PRN

## 2017-04-26 NOTE — Telephone Encounter (Signed)
Spoke with patient-pt still reports urgency/frequency with urination along with bladder spasms. She would like md to proceed with calling in [facility] [place]. rx sent to pharmacy as directed by md. Pt educated that the tablet would turn her urine red/orange in color. She states that she is aware of this.

## 2017-04-26 NOTE — Telephone Encounter (Signed)
-----   Message from [name], MD sent at 04/26/2017 11:40 AM EDT ----- Please inform patient that urinalysis- is negative for infection. If she is still having increased urgency/frequency- recommend Pyridium 100 mg every 8 hours x 7 days [give prescription if pt is interested]; please inform patient that this might turn her urine red/orange.

## 2017-04-27 ENCOUNTER — Telehealth: Payer: Self-pay | Admitting: *Deleted

## 2017-04-27 LAB — URINE CULTURE

## 2017-04-27 NOTE — Telephone Encounter (Signed)
Insurance will not cover medicine called in for her UTI, requests that something else be ordered. Please advise

## 2017-04-27 NOTE — Telephone Encounter (Signed)
Per VO Dr Rogue Bussing, AZO and Cranberry juice which is over the counter. Patient advised and repeated back to me

## 2017-05-10 ENCOUNTER — Inpatient Hospital Stay: Payer: BLUE CROSS/BLUE SHIELD

## 2017-05-10 ENCOUNTER — Ambulatory Visit: Payer: BLUE CROSS/BLUE SHIELD

## 2017-05-10 ENCOUNTER — Inpatient Hospital Stay: Payer: BLUE CROSS/BLUE SHIELD | Attending: Internal Medicine | Admitting: Internal Medicine

## 2017-05-10 VITALS — BP 148/88 | HR 90 | Temp 98.6°F | Resp 16 | Wt 182.1 lb

## 2017-05-10 DIAGNOSIS — Z170 Estrogen receptor positive status [ER+]: Principal | ICD-10-CM

## 2017-05-10 DIAGNOSIS — Z87442 Personal history of urinary calculi: Secondary | ICD-10-CM | POA: Insufficient documentation

## 2017-05-10 DIAGNOSIS — K921 Melena: Secondary | ICD-10-CM | POA: Diagnosis not present

## 2017-05-10 DIAGNOSIS — Z9013 Acquired absence of bilateral breasts and nipples: Secondary | ICD-10-CM | POA: Insufficient documentation

## 2017-05-10 DIAGNOSIS — R300 Dysuria: Secondary | ICD-10-CM | POA: Diagnosis not present

## 2017-05-10 DIAGNOSIS — I10 Essential (primary) hypertension: Secondary | ICD-10-CM | POA: Diagnosis not present

## 2017-05-10 DIAGNOSIS — Z79818 Long term (current) use of other agents affecting estrogen receptors and estrogen levels: Secondary | ICD-10-CM | POA: Diagnosis not present

## 2017-05-10 DIAGNOSIS — E7800 Pure hypercholesterolemia, unspecified: Secondary | ICD-10-CM

## 2017-05-10 DIAGNOSIS — R109 Unspecified abdominal pain: Secondary | ICD-10-CM | POA: Diagnosis not present

## 2017-05-10 DIAGNOSIS — Z9221 Personal history of antineoplastic chemotherapy: Secondary | ICD-10-CM | POA: Insufficient documentation

## 2017-05-10 DIAGNOSIS — E119 Type 2 diabetes mellitus without complications: Secondary | ICD-10-CM | POA: Diagnosis not present

## 2017-05-10 DIAGNOSIS — R42 Dizziness and giddiness: Secondary | ICD-10-CM

## 2017-05-10 DIAGNOSIS — K1379 Other lesions of oral mucosa: Secondary | ICD-10-CM | POA: Insufficient documentation

## 2017-05-10 DIAGNOSIS — C50511 Malignant neoplasm of lower-outer quadrant of right female breast: Secondary | ICD-10-CM

## 2017-05-10 DIAGNOSIS — N289 Disorder of kidney and ureter, unspecified: Secondary | ICD-10-CM | POA: Insufficient documentation

## 2017-05-10 DIAGNOSIS — C771 Secondary and unspecified malignant neoplasm of intrathoracic lymph nodes: Secondary | ICD-10-CM | POA: Diagnosis not present

## 2017-05-10 DIAGNOSIS — R112 Nausea with vomiting, unspecified: Secondary | ICD-10-CM | POA: Diagnosis not present

## 2017-05-10 DIAGNOSIS — Z79899 Other long term (current) drug therapy: Secondary | ICD-10-CM | POA: Insufficient documentation

## 2017-05-10 DIAGNOSIS — R05 Cough: Secondary | ICD-10-CM | POA: Diagnosis not present

## 2017-05-10 DIAGNOSIS — C7951 Secondary malignant neoplasm of bone: Secondary | ICD-10-CM

## 2017-05-10 DIAGNOSIS — M129 Arthropathy, unspecified: Secondary | ICD-10-CM

## 2017-05-10 DIAGNOSIS — R197 Diarrhea, unspecified: Secondary | ICD-10-CM | POA: Diagnosis not present

## 2017-05-10 DIAGNOSIS — Z7984 Long term (current) use of oral hypoglycemic drugs: Secondary | ICD-10-CM | POA: Diagnosis not present

## 2017-05-10 DIAGNOSIS — Z923 Personal history of irradiation: Secondary | ICD-10-CM | POA: Insufficient documentation

## 2017-05-10 DIAGNOSIS — Z803 Family history of malignant neoplasm of breast: Secondary | ICD-10-CM | POA: Insufficient documentation

## 2017-05-10 LAB — COMPREHENSIVE METABOLIC PANEL
ALBUMIN: 3.9 g/dL (ref 3.5–5.0)
ALT: 26 U/L (ref 14–54)
ANION GAP: 8 (ref 5–15)
AST: 26 U/L (ref 15–41)
Alkaline Phosphatase: 53 U/L (ref 38–126)
BUN: 19 mg/dL (ref 6–20)
CHLORIDE: 103 mmol/L (ref 101–111)
CO2: 24 mmol/L (ref 22–32)
CREATININE: 1.12 mg/dL — AB (ref 0.44–1.00)
Calcium: 9.5 mg/dL (ref 8.9–10.3)
GFR calc non Af Amer: 52 mL/min — ABNORMAL LOW (ref >60)
GFR, EST AFRICAN AMERICAN: 60 mL/min — AB (ref >60)
Glucose, Bld: 167 mg/dL — ABNORMAL HIGH (ref 65–99)
POTASSIUM: 4.8 mmol/L (ref 3.5–5.1)
SODIUM: 135 mmol/L (ref 135–145)
Total Bilirubin: 0.4 mg/dL (ref 0.3–1.2)
Total Protein: 7.3 g/dL (ref 6.5–8.1)

## 2017-05-10 LAB — CBC WITH DIFFERENTIAL/PLATELET
BASOS ABS: 0.1 10*3/uL (ref 0–0.1)
BASOS PCT: 3 %
EOS PCT: 3 %
Eosinophils Absolute: 0.1 10*3/uL (ref 0–0.7)
HCT: 33.7 % — ABNORMAL LOW (ref 35.0–47.0)
Hemoglobin: 11.4 g/dL — ABNORMAL LOW (ref 12.0–16.0)
Lymphocytes Relative: 55 %
Lymphs Abs: 1.9 10*3/uL (ref 1.0–3.6)
MCH: 30.1 pg (ref 26.0–34.0)
MCHC: 33.9 g/dL (ref 32.0–36.0)
MCV: 88.8 fL (ref 80.0–100.0)
MONO ABS: 0.2 10*3/uL (ref 0.2–0.9)
Monocytes Relative: 5 %
Neutro Abs: 1.2 10*3/uL — ABNORMAL LOW (ref 1.4–6.5)
Neutrophils Relative %: 34 %
PLATELETS: 290 10*3/uL (ref 150–440)
RBC: 3.8 MIL/uL (ref 3.80–5.20)
RDW: 17.4 % — AB (ref 11.5–14.5)
WBC: 3.6 10*3/uL (ref 3.6–11.0)

## 2017-05-10 MED ORDER — FULVESTRANT 250 MG/5ML IM SOLN
500.0000 mg | Freq: Once | INTRAMUSCULAR | Status: AC
  Administered 2017-05-10: 500 mg via INTRAMUSCULAR

## 2017-05-10 NOTE — Progress Notes (Signed)
Patient is here today for a follow up. Patient reports no new concerns today.  

## 2017-05-10 NOTE — Progress Notes (Signed)
[place] OFFICE PROGRESS NOTE  Patient Care Team: Karen Kitchens, MD as PCP - General (Family Medicine) Neldon Mc, MD (General Surgery) Everlene Farrier, MD (Obstetrics and Gynecology) Noreene Filbert, MD Forest Gleason, MD (Unknown Physician Specialty)  Cancer Staging Carcinoma of lower-outer quadrant of right breast in female, estrogen receptor positive [facility]) Staging form: Breast, AJCC 7th Edition - Pathologic: ypT1c,ypN2a, MX - Signed by Haywood Lasso, MD on 03/10/2012    Oncology History   # DEC 2012- RIGHT BREAST CA T2 N2 M0 tumor from biopsy.  Estrogen receptor positive, Progesterone receptor positive.  Current receptor negative by FISH 2. Neoadjuvant chemotherapy started in December of 28 with Cytoxan Adriamycin 3. Started on Taxol weekly chemotherapy. 4. Patient finished 12  cycles of Taxol chemotherapy in May of 2013.     5. Status post right modified radical mastectomy [Dr.Bowers; [place]] June of 2013, ypT1c  yp N2  MO. started also on Lerazole    7. Radiation therapy to the right breast (September of 2013).  Lymph node was positive for HER-2 receptor gene amplification of 2.22.  Will proceed with Herceptin treatment starting in September of 2013.   8.Patient has finished Herceptin (maintenance therapy) in August of 2014 8. Start patient on letrozole from November, 2013. 9. Patient started on Herceptin in September 2013.   10Patient finished 12 months of Herceptin therapy on August, 2014  # 6. Status post left side prophylactic mastectomy.  #Late MAY 2018-RECURRENCE BREAST CA- ER POS; PR-NEG; Her-Pending  [elevated Tumor marker- CT/PET- uptake in Right Mediastinal LN; Sternum [June 2018 [facility]- Dr.Kasa]   # March 10 2017- faslodex + Abema     Carcinoma of lower-outer quadrant of right breast in female, estrogen receptor positive ([place])    INTERVAL HISTORY:  Jonica Bickhart Feasel 61 y.o.  female pleasant patient above history of Recently  diagnosed stage IV breast cancer ER/PR positive ? HER-2/neu- is here for follow-up. Patient is currently on Faslodex plus Abemaciclib.  Patient had episode of dysuria- resolved.   She has one to 2 loose stools a day. Not needing to take Imodium on a regular basis. Otherwise no chest pain or shortness of breath or cough. Had complained of mild soreness in mouth currently resolved. Denies any bone pain.  REVIEW OF SYSTEMS:  A complete 10 point review of system is done which is negative except mentioned above/history of present illness.   PAST MEDICAL HISTORY :  Past Medical History:  Diagnosis Date  . Arthritis   . Cancer of lower-outer quadrant of female breast ([place]) 08/06/2011   RIGHT   . Diabetes mellitus ([place]) 03/24/2012  . High cholesterol   . History of kidney stones   . Hx of bilateral breast implants   . Hypertension   . PONV (postoperative nausea and vomiting)   . Type II diabetes mellitus ([facility])    fasting 140-150  . Vertigo    LAST WEEK  . Vertigo 01/2017    PAST SURGICAL HISTORY :   Past Surgical History:  Procedure Laterality Date  . BREAST BIOPSY  07/2011   right  . BREAST RECONSTRUCTION  03/07/2012   Procedure: BREAST RECONSTRUCTION;  Surgeon: Crissie Reese, MD;  Location: [facility];  Service: Plastics;  Laterality: Left;  BREAST RECONSTRUCTION WITH PLACEMENT OF TISSUE EXPANDER TO LEFT BREAST  . BREAST RECONSTRUCTION  02/06/2013  . CESAREAN SECTION  [id number]  . ENDOBRONCHIAL ULTRASOUND N/A 02/24/2017   Procedure: ENDOBRONCHIAL ULTRASOUND;  Surgeon: [name], MD;  Location: [facility]  ORS;  Service: Cardiopulmonary;  Laterality: N/A;  . LATISSIMUS FLAP TO BREAST Right 02/06/2013   Procedure: LATISSIMUS FLAP TO RIGHT BREAST WITH IMPLANT;  Surgeon: Crissie Reese, MD;  Location: [facility];  Service: Plastics;  Laterality: Right;  . MASTECTOMY  03/07/12   modified right; total left  . MODIFIED MASTECTOMY  03/07/2012   Procedure: MODIFIED MASTECTOMY;  Surgeon: Haywood Lasso, MD;   Location: [place];  Service: General;  Laterality: Right;  . PORTACATH PLACEMENT  07/2011  . SCAR REVISION  03/30/2012   Procedure: SCAR REVISION;  Surgeon: Haywood Lasso, MD;  Location: [facility];  Service: General;  Laterality: Right;  CLOSURE OF MASTECTOMY INCISION  . WISDOM TOOTH EXTRACTION      FAMILY HISTORY :   Family History  Problem Relation Age of Onset  . Cancer Mother        breast    SOCIAL HISTORY:   Social History  Substance Use Topics  . Smoking status: Never Smoker  . Smokeless tobacco: Never Used  . Alcohol use No    ALLERGIES:  is allergic to sulfa antibiotics.  MEDICATIONS:  Current Outpatient Prescriptions  Medication Sig Dispense Refill  . Abemaciclib 150 MG TABS Take 150 mg by mouth every 12 (twelve) hours. DO NOT START UNTIL EVALUATED OKAY by MD. (Patient taking differently: Take 150 mg by mouth every 12 (twelve) hours. ) 60 tablet 6  . atorvastatin (LIPITOR) 20 MG tablet Take 20 mg by mouth every morning.    . Calcium Carbonate-Vitamin D (CALCIUM 600 + D PO) Take 1 tablet by mouth 2 (two) times daily.    . fulvestrant (FASLODEX) 250 MG/5ML injection Inject into the muscle every 30 (thirty) days. One injection each buttock over 1-2 minutes. Warm prior to use.    Marland Kitchen glipiZIDE (GLUCOTROL) 10 MG tablet Take 10 mg by mouth daily before breakfast.    . lisinopril (PRINIVIL,ZESTRIL) 10 MG tablet Take 10 mg by mouth every morning.     . loperamide (IMODIUM A-D) 2 MG tablet Take 2 mg by mouth 4 (four) times daily as needed for diarrhea or loose stools.    . Semaglutide (OZEMPIC) 0.25 or 0.5 MG/DOSE SOPN Inject 0.25 mg into the skin once a week.    . ondansetron (ZOFRAN) 8 MG tablet Take 1 tablet (8 mg total) by mouth every 8 (eight) hours as needed for nausea or vomiting. 20 tablet 3  . pantoprazole (PROTONIX) 40 MG tablet Take 1 tablet (40 mg total) by mouth daily. 30 mins prior to meals; On empty stomach 30 tablet 0  . prochlorperazine  (COMPAZINE) 10 MG tablet Take 1 tablet (10 mg total) by mouth every 6 (six) hours as needed for nausea or vomiting. 30 tablet 0   No current facility-administered medications for this visit.     PHYSICAL EXAMINATION: ECOG PERFORMANCE STATUS: 0 - Asymptomatic  BP (!) 148/88 (BP Location: Left Arm, Patient Position: Sitting)   Pulse 90   Temp 98.6 F (37 C)   Resp 16   Wt 182 lb 1.6 oz (82.6 kg)   BMI 33.31 kg/m   Filed Weights   05/10/17 0843  Weight: 182 lb 1.6 oz (82.6 kg)    GENERAL: Well-nourished well-developed; Alert, no distress and comfortable.   Accompanied by her husband. EYES: no pallor or icterus OROPHARYNX: no thrush or ulceration; good dentition  NECK: supple, no masses felt LYMPH:  no palpable lymphadenopathy in the cervical, axillary or inguinal regions LUNGS: clear to  auscultation and  No wheeze or crackles HEART/CVS: regular rate & rhythm and no murmurs; No lower extremity edema ABDOMEN:abdomen soft, non-tender and normal bowel sounds Musculoskeletal:no cyanosis of digits and no clubbing  PSYCH: alert & oriented x 3 with fluent speech NEURO: no focal motor/sensory deficits SKIN:  no rashes or significant lesions  LABORATORY DATA:  I have reviewed the data as listed    Component Value Date/Time   NA 134 (L) 05/24/2017 0936   NA 135 11/28/2014 1401   K 4.3 05/24/2017 0936   K 4.7 11/28/2014 1401   CL 99 (L) 05/24/2017 0936   CL 99 (L) 11/28/2014 1401   CO2 23 05/24/2017 0936   CO2 28 11/28/2014 1401   GLUCOSE 184 (H) 05/24/2017 0936   GLUCOSE 215 (H) 11/28/2014 1401   BUN 22 (H) 05/24/2017 0936   BUN 16 11/28/2014 1401   CREATININE 1.25 (H) 05/24/2017 0936   CREATININE 0.96 11/28/2014 1401   CALCIUM 10.6 (H) 05/24/2017 0936   CALCIUM 9.9 11/28/2014 1401   PROT 7.6 05/24/2017 0936   PROT 7.6 11/28/2014 1401   ALBUMIN 4.2 05/24/2017 0936   ALBUMIN 4.5 11/28/2014 1401   AST 38 05/24/2017 0936   AST 26 11/28/2014 1401   ALT 24 05/24/2017 0936    ALT 28 11/28/2014 1401   ALKPHOS 46 05/24/2017 0936   ALKPHOS 78 11/28/2014 1401   BILITOT 0.4 05/24/2017 0936   BILITOT 0.5 11/28/2014 1401   GFRNONAA 45 (L) 05/24/2017 0936   GFRNONAA >60 11/28/2014 1401   GFRAA 52 (L) 05/24/2017 0936   GFRAA >60 11/28/2014 1401    No results found for: SPEP, UPEP  Lab Results  Component Value Date   WBC 4.5 05/24/2017   NEUTROABS 2.4 05/24/2017   HGB 11.3 (L) 05/24/2017   HCT 32.4 (L) 05/24/2017   MCV 89.1 05/24/2017   PLT 285 05/24/2017      Chemistry      Component Value Date/Time   NA 134 (L) 05/24/2017 0936   NA 135 11/28/2014 1401   K 4.3 05/24/2017 0936   K 4.7 11/28/2014 1401   CL 99 (L) 05/24/2017 0936   CL 99 (L) 11/28/2014 1401   CO2 23 05/24/2017 0936   CO2 28 11/28/2014 1401   BUN 22 (H) 05/24/2017 0936   BUN 16 11/28/2014 1401   CREATININE 1.25 (H) 05/24/2017 0936   CREATININE 0.96 11/28/2014 1401      Component Value Date/Time   CALCIUM 10.6 (H) 05/24/2017 0936   CALCIUM 9.9 11/28/2014 1401   ALKPHOS 46 05/24/2017 0936   ALKPHOS 78 11/28/2014 1401   AST 38 05/24/2017 0936   AST 26 11/28/2014 1401   ALT 24 05/24/2017 0936   ALT 28 11/28/2014 1401   BILITOT 0.4 05/24/2017 0936   BILITOT 0.5 11/28/2014 1401       RADIOGRAPHIC STUDIES: I have personally reviewed the radiological images as listed and agreed with the findings in the report. No results found.   ASSESSMENT & PLAN:  Carcinoma of lower-outer quadrant of right breast in female, estrogen receptor positive ([place]) STAGE IV.-Recurrent/metastatic ER positive/PR negative HER-2/neu-? breast cancer [biopsy-proven-mediastinal lymph node; in suff for her 2 tetsing].    # Patient currently on Faslodex; tolerating Abema 150 mg BID; continue the same.  # Diarrhea- 1 loose stool a day. Currently on Imodium once a day. Not any worse.  # Dysuria status post fluid intake; UA negative. Currently resolved.  # cough- question to URI versus allergies. No  concerns  for lower respiratory infection. Recommend over-the-counter cough medication.   # soreness in mouth- improved. Continue baking soda and salt water rinses.  # Given the solitary bone lesion- Monitor for now.   # Follow-up October 9 Faslodex labs; CT chest. His prior.   No orders of the defined types were placed in this encounter.  All questions were answered. The patient knows to call the clinic with any problems, questions or concerns.      [name], MD 05/27/2017 9:54 PM

## 2017-05-10 NOTE — Assessment & Plan Note (Addendum)
STAGE IV.-Recurrent/metastatic ER positive/PR negative HER-2/neu-? breast cancer [biopsy-proven-mediastinal lymph node; in suff for her 2 tetsing].    # Patient currently on Faslodex; tolerating Abema 150 mg BID; continue the same.  # Diarrhea- 1 loose stool a day. Currently on Imodium once a day. Not any worse.  # Dysuria status post fluid intake; UA negative. Currently resolved.  # cough- question to URI versus allergies. No concerns for lower respiratory infection. Recommend over-the-counter cough medication.   # soreness in mouth- improved. Continue baking soda and salt water rinses.  # Given the solitary bone lesion- Monitor for now.   # Follow-up October 9 Faslodex labs; CT chest. His prior.

## 2017-05-11 ENCOUNTER — Other Ambulatory Visit: Payer: Self-pay | Admitting: Internal Medicine

## 2017-05-11 DIAGNOSIS — Z170 Estrogen receptor positive status [ER+]: Principal | ICD-10-CM

## 2017-05-11 DIAGNOSIS — C50511 Malignant neoplasm of lower-outer quadrant of right female breast: Secondary | ICD-10-CM

## 2017-05-11 LAB — CANCER ANTIGEN 27.29: CA 27.29: 56.9 U/mL — ABNORMAL HIGH (ref 0.0–38.6)

## 2017-05-12 ENCOUNTER — Telehealth: Payer: Self-pay | Admitting: *Deleted

## 2017-05-12 NOTE — Telephone Encounter (Signed)
Patient has been notified of these lab results and verbalized understanding. CT scan has been ordered and message has been sent to scheduling team.

## 2017-05-12 NOTE — Progress Notes (Signed)
msg has already been sent to sch. Team to sch ct scan per md order

## 2017-05-12 NOTE — Telephone Encounter (Signed)
    Ref Range & Units 2d ago 36mo ago   CA 27.29 0.0 - 38.6 U/mL 56.9   47.3CM         Patient called to request results.  [phone]

## 2017-05-24 ENCOUNTER — Other Ambulatory Visit: Payer: Self-pay

## 2017-05-24 ENCOUNTER — Inpatient Hospital Stay: Payer: BLUE CROSS/BLUE SHIELD

## 2017-05-24 ENCOUNTER — Inpatient Hospital Stay (HOSPITAL_BASED_OUTPATIENT_CLINIC_OR_DEPARTMENT_OTHER): Payer: BLUE CROSS/BLUE SHIELD | Admitting: Internal Medicine

## 2017-05-24 ENCOUNTER — Telehealth: Payer: Self-pay | Admitting: *Deleted

## 2017-05-24 VITALS — BP 166/102 | HR 99 | Temp 97.8°F | Resp 20 | Ht 62.0 in | Wt 180.0 lb

## 2017-05-24 DIAGNOSIS — R300 Dysuria: Secondary | ICD-10-CM

## 2017-05-24 DIAGNOSIS — T451X5A Adverse effect of antineoplastic and immunosuppressive drugs, initial encounter: Principal | ICD-10-CM

## 2017-05-24 DIAGNOSIS — Z170 Estrogen receptor positive status [ER+]: Secondary | ICD-10-CM | POA: Diagnosis not present

## 2017-05-24 DIAGNOSIS — N289 Disorder of kidney and ureter, unspecified: Secondary | ICD-10-CM

## 2017-05-24 DIAGNOSIS — K921 Melena: Secondary | ICD-10-CM | POA: Diagnosis not present

## 2017-05-24 DIAGNOSIS — R05 Cough: Secondary | ICD-10-CM

## 2017-05-24 DIAGNOSIS — Z87442 Personal history of urinary calculi: Secondary | ICD-10-CM

## 2017-05-24 DIAGNOSIS — C50511 Malignant neoplasm of lower-outer quadrant of right female breast: Secondary | ICD-10-CM

## 2017-05-24 DIAGNOSIS — Z9221 Personal history of antineoplastic chemotherapy: Secondary | ICD-10-CM

## 2017-05-24 DIAGNOSIS — K1379 Other lesions of oral mucosa: Secondary | ICD-10-CM

## 2017-05-24 DIAGNOSIS — R197 Diarrhea, unspecified: Secondary | ICD-10-CM

## 2017-05-24 DIAGNOSIS — E119 Type 2 diabetes mellitus without complications: Secondary | ICD-10-CM

## 2017-05-24 DIAGNOSIS — R42 Dizziness and giddiness: Secondary | ICD-10-CM

## 2017-05-24 DIAGNOSIS — M129 Arthropathy, unspecified: Secondary | ICD-10-CM

## 2017-05-24 DIAGNOSIS — E7800 Pure hypercholesterolemia, unspecified: Secondary | ICD-10-CM

## 2017-05-24 DIAGNOSIS — Z79818 Long term (current) use of other agents affecting estrogen receptors and estrogen levels: Secondary | ICD-10-CM | POA: Diagnosis not present

## 2017-05-24 DIAGNOSIS — C771 Secondary and unspecified malignant neoplasm of intrathoracic lymph nodes: Secondary | ICD-10-CM | POA: Diagnosis not present

## 2017-05-24 DIAGNOSIS — R109 Unspecified abdominal pain: Secondary | ICD-10-CM

## 2017-05-24 DIAGNOSIS — Z923 Personal history of irradiation: Secondary | ICD-10-CM

## 2017-05-24 DIAGNOSIS — R112 Nausea with vomiting, unspecified: Secondary | ICD-10-CM

## 2017-05-24 DIAGNOSIS — I10 Essential (primary) hypertension: Secondary | ICD-10-CM

## 2017-05-24 DIAGNOSIS — Z9013 Acquired absence of bilateral breasts and nipples: Secondary | ICD-10-CM

## 2017-05-24 DIAGNOSIS — Z79899 Other long term (current) drug therapy: Secondary | ICD-10-CM

## 2017-05-24 DIAGNOSIS — R110 Nausea: Secondary | ICD-10-CM

## 2017-05-24 DIAGNOSIS — Z803 Family history of malignant neoplasm of breast: Secondary | ICD-10-CM

## 2017-05-24 DIAGNOSIS — C7951 Secondary malignant neoplasm of bone: Secondary | ICD-10-CM | POA: Diagnosis not present

## 2017-05-24 DIAGNOSIS — Z7984 Long term (current) use of oral hypoglycemic drugs: Secondary | ICD-10-CM

## 2017-05-24 DIAGNOSIS — K625 Hemorrhage of anus and rectum: Secondary | ICD-10-CM

## 2017-05-24 LAB — CBC WITH DIFFERENTIAL/PLATELET
BASOS ABS: 0.1 10*3/uL (ref 0–0.1)
BASOS PCT: 2 %
Eosinophils Absolute: 0.1 10*3/uL (ref 0–0.7)
Eosinophils Relative: 1 %
HEMATOCRIT: 32.4 % — AB (ref 35.0–47.0)
Hemoglobin: 11.3 g/dL — ABNORMAL LOW (ref 12.0–16.0)
Lymphocytes Relative: 40 %
Lymphs Abs: 1.8 10*3/uL (ref 1.0–3.6)
MCH: 31 pg (ref 26.0–34.0)
MCHC: 34.8 g/dL (ref 32.0–36.0)
MCV: 89.1 fL (ref 80.0–100.0)
MONO ABS: 0.2 10*3/uL (ref 0.2–0.9)
Monocytes Relative: 4 %
NEUTROS ABS: 2.4 10*3/uL (ref 1.4–6.5)
NEUTROS PCT: 53 %
Platelets: 285 10*3/uL (ref 150–440)
RBC: 3.64 MIL/uL — AB (ref 3.80–5.20)
RDW: 18.6 % — AB (ref 11.5–14.5)
WBC: 4.5 10*3/uL (ref 3.6–11.0)

## 2017-05-24 LAB — COMPREHENSIVE METABOLIC PANEL
ALBUMIN: 4.2 g/dL (ref 3.5–5.0)
ALT: 24 U/L (ref 14–54)
ANION GAP: 12 (ref 5–15)
AST: 38 U/L (ref 15–41)
Alkaline Phosphatase: 46 U/L (ref 38–126)
BILIRUBIN TOTAL: 0.4 mg/dL (ref 0.3–1.2)
BUN: 22 mg/dL — ABNORMAL HIGH (ref 6–20)
CALCIUM: 10.6 mg/dL — AB (ref 8.9–10.3)
CO2: 23 mmol/L (ref 22–32)
CREATININE: 1.25 mg/dL — AB (ref 0.44–1.00)
Chloride: 99 mmol/L — ABNORMAL LOW (ref 101–111)
GFR calc non Af Amer: 45 mL/min — ABNORMAL LOW (ref >60)
GFR, EST AFRICAN AMERICAN: 52 mL/min — AB (ref >60)
Glucose, Bld: 184 mg/dL — ABNORMAL HIGH (ref 65–99)
Potassium: 4.3 mmol/L (ref 3.5–5.1)
Sodium: 134 mmol/L — ABNORMAL LOW (ref 135–145)
TOTAL PROTEIN: 7.6 g/dL (ref 6.5–8.1)

## 2017-05-24 LAB — SAMPLE TO BLOOD BANK

## 2017-05-24 MED ORDER — ONDANSETRON HCL 8 MG PO TABS: 8.0000 mg | ORAL_TABLET | Freq: Three times a day (TID) | ORAL | 3 refills | Status: DC | PRN

## 2017-05-24 MED ORDER — PROCHLORPERAZINE MALEATE 10 MG PO TABS: 10.0000 mg | ORAL_TABLET | Freq: Four times a day (QID) | ORAL | 0 refills | Status: DC | PRN

## 2017-05-24 MED ORDER — PANTOPRAZOLE SODIUM 40 MG PO TBEC: 40.0000 mg | DELAYED_RELEASE_TABLET | Freq: Every day | ORAL | 0 refills | Status: DC

## 2017-05-24 NOTE — Telephone Encounter (Signed)
Call returned- per md pt needs to come to [facility] In [place] now for further evaluation. Lab orders: cbc, metc, hold tube. Pt agreeable. pt Will come within 30 mins.

## 2017-05-24 NOTE — Progress Notes (Signed)
[place] OFFICE PROGRESS NOTE  Patient Care Team: Karen Kitchens, MD as PCP - General (Family Medicine) Neldon Mc, MD (General Surgery) Everlene Farrier, MD (Obstetrics and Gynecology) Noreene Filbert, MD Forest Gleason, MD (Unknown Physician Specialty)  Cancer Staging Carcinoma of lower-outer quadrant of right breast in female, estrogen receptor positive [facility]) Staging form: Breast, AJCC 7th Edition - Pathologic: ypT1c,ypN2a, MX - Signed by Haywood Lasso, MD on 03/10/2012    Oncology History   # DEC 2012- RIGHT BREAST CA T2 N2 M0 tumor from biopsy.  Estrogen receptor positive, Progesterone receptor positive.  Current receptor negative by FISH 2. Neoadjuvant chemotherapy started in December of 28 with Cytoxan Adriamycin 3. Started on Taxol weekly chemotherapy. 4. Patient finished 12  cycles of Taxol chemotherapy in May of 2013.     5. Status post right modified radical mastectomy [Dr.Bowers; [place]] June of 2013, ypT1c  yp N2  MO. started also on Lerazole    7. Radiation therapy to the right breast (September of 2013).  Lymph node was positive for HER-2 receptor gene amplification of 2.22.  Will proceed with Herceptin treatment starting in September of 2013.   8.Patient has finished Herceptin (maintenance therapy) in August of 2014 8. Start patient on letrozole from November, 2013. 9. Patient started on Herceptin in September 2013.   10Patient finished 12 months of Herceptin therapy on August, 2014  # 6. Status post left side prophylactic mastectomy.  #Late MAY 2018-RECURRENCE BREAST CA- ER POS; PR-NEG; Her-Pending  [elevated Tumor marker- CT/PET- uptake in Right Mediastinal LN; Sternum [June 2018 EBUS- Dr.Kasa]   # March 10 2017- faslodex + Abema     Carcinoma of lower-outer quadrant of right breast in female, estrogen receptor positive ([name])    INTERVAL HISTORY:  Kimberly [name] 61 y.o.  female pleasant patient above history of Recently  diagnosed stage IV breast cancer ER/PR positive ? HER-2/neu- is here for follow-up. Patient is currently on Faslodex plus Abemaciclib.  Patient noted to have an episode of nausea with abdominal pain/vomiting that started last night. She woke up this morning and noted to have loose stools; associated with blood from the rectum while wiping. Up to 2 episodes this morning.  Patient feels tired. She has a crampy abdominal pain- however manageable. No fever no chills. She denies any new lumps or bumps. No bone pain. No headaches. Patient complains of discomfort while urination. Denies any back pain.    REVIEW OF SYSTEMS:  A complete 10 point review of system is done which is negative except mentioned above/history of present illness.   PAST MEDICAL HISTORY :  Past Medical History:  Diagnosis Date  . Arthritis   . Cancer of lower-outer quadrant of female breast ([name]) 08/06/2011   RIGHT   . Diabetes mellitus ([place]) 03/24/2012  . High cholesterol   . History of kidney stones   . Hx of bilateral breast implants   . Hypertension   . PONV (postoperative nausea and vomiting)   . Type II diabetes mellitus ([facility])    fasting 140-150  . Vertigo    LAST WEEK  . Vertigo 01/2017    PAST SURGICAL HISTORY :   Past Surgical History:  Procedure Laterality Date  . BREAST BIOPSY  07/2011   right  . BREAST RECONSTRUCTION  03/07/2012   Procedure: BREAST RECONSTRUCTION;  Surgeon: Crissie Reese, MD;  Location: [place];  Service: Plastics;  Laterality: Left;  BREAST RECONSTRUCTION WITH PLACEMENT OF TISSUE EXPANDER TO LEFT BREAST  .  BREAST RECONSTRUCTION  02/06/2013  . CESAREAN SECTION  [id number]  . ENDOBRONCHIAL ULTRASOUND N/A 02/24/2017   Procedure: ENDOBRONCHIAL ULTRASOUND;  Surgeon: [name], MD;  Location: [facility];  Service: Cardiopulmonary;  Laterality: N/A;  . LATISSIMUS FLAP TO BREAST Right 02/06/2013   Procedure: LATISSIMUS FLAP TO RIGHT BREAST WITH IMPLANT;  Surgeon: Crissie Reese, MD;  Location: [place];   Service: Plastics;  Laterality: Right;  . MASTECTOMY  03/07/12   modified right; total left  . MODIFIED MASTECTOMY  03/07/2012   Procedure: MODIFIED MASTECTOMY;  Surgeon: Haywood Lasso, MD;  Location: [place];  Service: General;  Laterality: Right;  . PORTACATH PLACEMENT  07/2011  . SCAR REVISION  03/30/2012   Procedure: SCAR REVISION;  Surgeon: Haywood Lasso, MD;  Location: [place];  Service: General;  Laterality: Right;  CLOSURE OF MASTECTOMY INCISION  . WISDOM TOOTH EXTRACTION      FAMILY HISTORY :   Family History  Problem Relation Age of Onset  . Cancer Mother        breast    SOCIAL HISTORY:   Social History  Substance Use Topics  . Smoking status: Never Smoker  . Smokeless tobacco: Never Used  . Alcohol use No    ALLERGIES:  is allergic to sulfa antibiotics.  MEDICATIONS:  Current Outpatient Prescriptions  Medication Sig Dispense Refill  . Abemaciclib 150 MG TABS Take 150 mg by mouth every 12 (twelve) hours. DO NOT START UNTIL EVALUATED OKAY by MD. (Patient taking differently: Take 150 mg by mouth every 12 (twelve) hours. ) 60 tablet 6  . atorvastatin (LIPITOR) 20 MG tablet Take 20 mg by mouth every morning.    . Calcium Carbonate-Vitamin D (CALCIUM 600 + D PO) Take 1 tablet by mouth 2 (two) times daily.    . fulvestrant (FASLODEX) 250 MG/5ML injection Inject into the muscle every 30 (thirty) days. One injection each buttock over 1-2 minutes. Warm prior to use.    Marland Kitchen glipiZIDE (GLUCOTROL) 10 MG tablet Take 10 mg by mouth daily before breakfast.    . lisinopril (PRINIVIL,ZESTRIL) 10 MG tablet Take 10 mg by mouth every morning.     . loperamide (IMODIUM A-D) 2 MG tablet Take 2 mg by mouth 4 (four) times daily as needed for diarrhea or loose stools.    . Semaglutide (OZEMPIC) 0.25 or 0.5 MG/DOSE SOPN Inject 0.25 mg into the skin once a week.    . ondansetron (ZOFRAN) 8 MG tablet Take 1 tablet (8 mg total) by mouth every 8 (eight) hours as needed for  nausea or vomiting. 20 tablet 3  . pantoprazole (PROTONIX) 40 MG tablet Take 1 tablet (40 mg total) by mouth daily. 30 mins prior to meals; On empty stomach 30 tablet 0  . prochlorperazine (COMPAZINE) 10 MG tablet Take 1 tablet (10 mg total) by mouth every 6 (six) hours as needed for nausea or vomiting. 30 tablet 0   No current facility-administered medications for this visit.     PHYSICAL EXAMINATION: ECOG PERFORMANCE STATUS: 0 - Asymptomatic  BP (!) 166/102   Pulse 99   Temp 97.8 F (36.6 C) (Tympanic)   Resp 20   Ht _0  (1.575 m)   Wt 180 lb (81.6 kg)   BMI 32.92 kg/m   Filed Weights   05/24/17 1009  Weight: 180 lb (81.6 kg)    GENERAL: Well-nourished well-developed; Alert, no distress and comfortable.   Accompanied by her husband. EYES: no pallor or icterus OROPHARYNX: no  thrush or ulceration; good dentition  NECK: supple, no masses felt LYMPH:  no palpable lymphadenopathy in the cervical, axillary or inguinal regions LUNGS: clear to auscultation and  No wheeze or crackles HEART/CVS: regular rate & rhythm and no murmurs; No lower extremity edema ABDOMEN:abdomen soft, Mildly tender and normal bowel sounds;  no rigidity or guarding. Rectal exam- hemorrhoids noted perianal; no blood on the finger; no masses felt.  Musculoskeletal:no cyanosis of digits and no clubbing  PSYCH: alert & oriented x 3 with fluent speech NEURO: no focal motor/sensory deficits SKIN:  no rashes or significant lesions;  LABORATORY DATA:  I have reviewed the data as listed    Component Value Date/Time   NA 134 (L) 05/24/2017 0936   NA 135 11/28/2014 1401   K 4.3 05/24/2017 0936   K 4.7 11/28/2014 1401   CL 99 (L) 05/24/2017 0936   CL 99 (L) 11/28/2014 1401   CO2 23 05/24/2017 0936   CO2 28 11/28/2014 1401   GLUCOSE 184 (H) 05/24/2017 0936   GLUCOSE 215 (H) 11/28/2014 1401   BUN 22 (H) 05/24/2017 0936   BUN 16 11/28/2014 1401   CREATININE 1.25 (H) 05/24/2017 0936   CREATININE 0.96  11/28/2014 1401   CALCIUM 10.6 (H) 05/24/2017 0936   CALCIUM 9.9 11/28/2014 1401   PROT 7.6 05/24/2017 0936   PROT 7.6 11/28/2014 1401   ALBUMIN 4.2 05/24/2017 0936   ALBUMIN 4.5 11/28/2014 1401   AST 38 05/24/2017 0936   AST 26 11/28/2014 1401   ALT 24 05/24/2017 0936   ALT 28 11/28/2014 1401   ALKPHOS 46 05/24/2017 0936   ALKPHOS 78 11/28/2014 1401   BILITOT 0.4 05/24/2017 0936   BILITOT 0.5 11/28/2014 1401   GFRNONAA 45 (L) 05/24/2017 0936   GFRNONAA >60 11/28/2014 1401   GFRAA 52 (L) 05/24/2017 0936   GFRAA >60 11/28/2014 1401    No results found for: SPEP, UPEP  Lab Results  Component Value Date   WBC 4.5 05/24/2017   NEUTROABS 2.4 05/24/2017   HGB 11.3 (L) 05/24/2017   HCT 32.4 (L) 05/24/2017   MCV 89.1 05/24/2017   PLT 285 05/24/2017      Chemistry      Component Value Date/Time   NA 134 (L) 05/24/2017 0936   NA 135 11/28/2014 1401   K 4.3 05/24/2017 0936   K 4.7 11/28/2014 1401   CL 99 (L) 05/24/2017 0936   CL 99 (L) 11/28/2014 1401   CO2 23 05/24/2017 0936   CO2 28 11/28/2014 1401   BUN 22 (H) 05/24/2017 0936   BUN 16 11/28/2014 1401   CREATININE 1.25 (H) 05/24/2017 0936   CREATININE 0.96 11/28/2014 1401      Component Value Date/Time   CALCIUM 10.6 (H) 05/24/2017 0936   CALCIUM 9.9 11/28/2014 1401   ALKPHOS 46 05/24/2017 0936   ALKPHOS 78 11/28/2014 1401   AST 38 05/24/2017 0936   AST 26 11/28/2014 1401   ALT 24 05/24/2017 0936   ALT 28 11/28/2014 1401   BILITOT 0.4 05/24/2017 0936   BILITOT 0.5 11/28/2014 1401       RADIOGRAPHIC STUDIES: I have personally reviewed the radiological images as listed and agreed with the findings in the report. No results found.   ASSESSMENT & PLAN:  Carcinoma of lower-outer quadrant of right breast in female, estrogen receptor positive ([place]) STAGE IV.-Recurrent/metastatic ER positive/PR negative HER-2/neu-? breast cancer [biopsy-proven-mediastinal lymph node; in suff for her 2 tetsing].    # Patient  currently  on Faslodex; on Abema 150 mg BID. Recent tumor marker elevated; awaiting CT of the chest in the next couple weeks.   # Nausea vomiting; 1 episode of diarrhea and blood in stools [hemorridal]/abdominal cramps- ? Related to Abema Recommend holding Abema for now. Patient will call us and let us know if the abdominal pain and discomfort not improved in next few days- Then recommend CT scan ASAP.   # Renal insufficiency- creatinine slightly elevated at 1.2; suspect prerenal recommend increased fluid intake.    # follow up call in on 9/28 to update- ? Restarting abema. Keep follow up appts as planned.    No orders of the defined types were placed in this encounter.  All questions were answered. The patient knows to call the clinic with any problems, questions or concerns.      [name], MD 05/24/2017 1:26 PM

## 2017-05-24 NOTE — Telephone Encounter (Signed)
-----   Message from Secundino Ginger sent at 05/24/2017  8:36 AM EDT ----- Regarding: pt sick Contact: [phone] Pt said she had Diarrhea and throwing up last nite. Today she has major heartburn from neck to bottom of her stomach. She also has slimy blood coming from her rectum. She wants to know if she needs to be seen today by Dr B?

## 2017-05-24 NOTE — Assessment & Plan Note (Addendum)
STAGE IV.-Recurrent/metastatic ER positive/PR negative HER-2/neu-? breast cancer [biopsy-proven-mediastinal lymph node; in suff for her 2 tetsing].    # Patient currently on Faslodex; on Abema 150 mg BID. Recent tumor marker elevated; awaiting CT of the chest in the next couple weeks.   # Nausea vomiting; 1 episode of diarrhea and blood in stools [hemorridal]/abdominal cramps- ? Related to Abema Recommend holding Abema for now. Patient will call us and let us know if the abdominal pain and discomfort not improved in next few days- Then recommend CT scan ASAP.   # Renal insufficiency- creatinine slightly elevated at 1.2; suspect prerenal recommend increased fluid intake.    # follow up call in on 9/28 to update- ? Restarting abema. Keep follow up appts as planned.

## 2017-05-24 NOTE — Progress Notes (Signed)
Pt reports 1 loose stool last evening. Reports that she noticed bright red bloody mucus when wiping herself this morning.  Patient only took 1 imodium last evening. She reports that her previous episodes of diarrhea has improved afer d/c the [facility]. Pt denies any mouth sores, fevers or chills. Pt held her Abemaciclib this morning.  Pt vomited several times yesterday. Does not have any antiemetics at home.

## 2017-05-27 ENCOUNTER — Telehealth: Payer: Self-pay | Admitting: *Deleted

## 2017-05-27 NOTE — Telephone Encounter (Signed)
Spoke with patient. she reports 1 episode of nausea upon awaking this morning and no longer reports any bloody from her rectum. She denies any diarrhea. When would Dr. B like her to start back on the Abemaciclib.  Rn Spoke with Dr. Rogue Bussing please have patient restart Abemaciclib 150 mg -1 tablet daily - have patient restart tonight.  Contacted patient - 1137 am - teach back process performed with new Abemaciclib medication directions

## 2017-06-03 ENCOUNTER — Ambulatory Visit
Admission: RE | Admit: 2017-06-03 | Discharge: 2017-06-03 | Disposition: A | Discharge status: Home / Self Care | Payer: BLUE CROSS/BLUE SHIELD | Source: Ambulatory Visit | Attending: Internal Medicine | Admitting: Internal Medicine

## 2017-06-03 DIAGNOSIS — C50511 Malignant neoplasm of lower-outer quadrant of right female breast: Secondary | ICD-10-CM | POA: Diagnosis present

## 2017-06-03 DIAGNOSIS — J439 Emphysema, unspecified: Secondary | ICD-10-CM | POA: Diagnosis not present

## 2017-06-03 DIAGNOSIS — I2510 Atherosclerotic heart disease of native coronary artery without angina pectoris: Secondary | ICD-10-CM | POA: Insufficient documentation

## 2017-06-03 DIAGNOSIS — Z923 Personal history of irradiation: Secondary | ICD-10-CM | POA: Diagnosis not present

## 2017-06-03 DIAGNOSIS — K760 Fatty (change of) liver, not elsewhere classified: Secondary | ICD-10-CM | POA: Insufficient documentation

## 2017-06-03 DIAGNOSIS — Z170 Estrogen receptor positive status [ER+]: Secondary | ICD-10-CM | POA: Insufficient documentation

## 2017-06-03 MED ORDER — IOPAMIDOL (ISOVUE-300) INJECTION 61%
75.0000 mL | Freq: Once | INTRAVENOUS | Status: AC | PRN
  Administered 2017-06-03: 75 mL via INTRAVENOUS

## 2017-06-07 ENCOUNTER — Inpatient Hospital Stay (HOSPITAL_BASED_OUTPATIENT_CLINIC_OR_DEPARTMENT_OTHER): Payer: BLUE CROSS/BLUE SHIELD | Admitting: Internal Medicine

## 2017-06-07 ENCOUNTER — Other Ambulatory Visit: Payer: Self-pay

## 2017-06-07 ENCOUNTER — Inpatient Hospital Stay: Payer: BLUE CROSS/BLUE SHIELD

## 2017-06-07 ENCOUNTER — Inpatient Hospital Stay: Payer: BLUE CROSS/BLUE SHIELD | Attending: Internal Medicine

## 2017-06-07 VITALS — BP 156/74 | HR 86 | Temp 97.6°F | Resp 20 | Ht 62.0 in | Wt 183.0 lb

## 2017-06-07 DIAGNOSIS — Z9221 Personal history of antineoplastic chemotherapy: Secondary | ICD-10-CM

## 2017-06-07 DIAGNOSIS — C50511 Malignant neoplasm of lower-outer quadrant of right female breast: Secondary | ICD-10-CM

## 2017-06-07 DIAGNOSIS — N2889 Other specified disorders of kidney and ureter: Secondary | ICD-10-CM

## 2017-06-07 DIAGNOSIS — Z79899 Other long term (current) drug therapy: Secondary | ICD-10-CM | POA: Diagnosis not present

## 2017-06-07 DIAGNOSIS — Z87442 Personal history of urinary calculi: Secondary | ICD-10-CM | POA: Diagnosis not present

## 2017-06-07 DIAGNOSIS — K760 Fatty (change of) liver, not elsewhere classified: Secondary | ICD-10-CM

## 2017-06-07 DIAGNOSIS — Z803 Family history of malignant neoplasm of breast: Secondary | ICD-10-CM | POA: Diagnosis not present

## 2017-06-07 DIAGNOSIS — R42 Dizziness and giddiness: Secondary | ICD-10-CM

## 2017-06-07 DIAGNOSIS — E1165 Type 2 diabetes mellitus with hyperglycemia: Secondary | ICD-10-CM | POA: Insufficient documentation

## 2017-06-07 DIAGNOSIS — Z79818 Long term (current) use of other agents affecting estrogen receptors and estrogen levels: Secondary | ICD-10-CM | POA: Insufficient documentation

## 2017-06-07 DIAGNOSIS — Z923 Personal history of irradiation: Secondary | ICD-10-CM | POA: Insufficient documentation

## 2017-06-07 DIAGNOSIS — E7800 Pure hypercholesterolemia, unspecified: Secondary | ICD-10-CM | POA: Insufficient documentation

## 2017-06-07 DIAGNOSIS — Z170 Estrogen receptor positive status [ER+]: Principal | ICD-10-CM

## 2017-06-07 DIAGNOSIS — Z7984 Long term (current) use of oral hypoglycemic drugs: Secondary | ICD-10-CM

## 2017-06-07 DIAGNOSIS — Z9013 Acquired absence of bilateral breasts and nipples: Secondary | ICD-10-CM | POA: Insufficient documentation

## 2017-06-07 DIAGNOSIS — Z79811 Long term (current) use of aromatase inhibitors: Secondary | ICD-10-CM | POA: Diagnosis not present

## 2017-06-07 LAB — COMPREHENSIVE METABOLIC PANEL
ALT: 28 U/L (ref 14–54)
ANION GAP: 9 (ref 5–15)
AST: 35 U/L (ref 15–41)
Albumin: 3.8 g/dL (ref 3.5–5.0)
Alkaline Phosphatase: 59 U/L (ref 38–126)
BILIRUBIN TOTAL: 0.3 mg/dL (ref 0.3–1.2)
BUN: 16 mg/dL (ref 6–20)
CALCIUM: 9.1 mg/dL (ref 8.9–10.3)
CO2: 25 mmol/L (ref 22–32)
Chloride: 99 mmol/L — ABNORMAL LOW (ref 101–111)
Creatinine, Ser: 1.05 mg/dL — ABNORMAL HIGH (ref 0.44–1.00)
GFR calc Af Amer: >60 mL/min (ref >60)
GFR, EST NON AFRICAN AMERICAN: 56 mL/min — AB (ref >60)
Glucose, Bld: 247 mg/dL — ABNORMAL HIGH (ref 65–99)
POTASSIUM: 4.5 mmol/L (ref 3.5–5.1)
Sodium: 133 mmol/L — ABNORMAL LOW (ref 135–145)
TOTAL PROTEIN: 7.2 g/dL (ref 6.5–8.1)

## 2017-06-07 LAB — CBC WITH DIFFERENTIAL/PLATELET
Basophils Absolute: 0.1 10*3/uL (ref 0–0.1)
Basophils Relative: 1 %
Eosinophils Absolute: 0.1 10*3/uL (ref 0–0.7)
Eosinophils Relative: 3 %
HEMATOCRIT: 30.9 % — AB (ref 35.0–47.0)
Hemoglobin: 10.7 g/dL — ABNORMAL LOW (ref 12.0–16.0)
LYMPHS ABS: 1.4 10*3/uL (ref 1.0–3.6)
LYMPHS PCT: 37 %
MCH: 31.6 pg (ref 26.0–34.0)
MCHC: 34.5 g/dL (ref 32.0–36.0)
MCV: 91.8 fL (ref 80.0–100.0)
MONO ABS: 0.2 10*3/uL (ref 0.2–0.9)
MONOS PCT: 4 %
NEUTROS ABS: 2.1 10*3/uL (ref 1.4–6.5)
Neutrophils Relative %: 55 %
Platelets: 367 10*3/uL (ref 150–440)
RBC: 3.37 MIL/uL — ABNORMAL LOW (ref 3.80–5.20)
RDW: 19.1 % — AB (ref 11.5–14.5)
WBC: 3.9 10*3/uL (ref 3.6–11.0)

## 2017-06-07 MED ORDER — FULVESTRANT 250 MG/5ML IM SOLN
500.0000 mg | Freq: Once | INTRAMUSCULAR | Status: AC
  Administered 2017-06-07: 500 mg via INTRAMUSCULAR
  Filled 2017-06-07: qty 10

## 2017-06-07 NOTE — Assessment & Plan Note (Addendum)
STAGE IV.-Recurrent/metastatic ER positive/PR negative HER-2/neu-? breast cancer [biopsy-proven-mediastinal lymph node; in suff for her 2 tetsing].    # Patient currently on Faslodex; on Abema. CT scan shows PR of mediastinal LN; however tumor marker slightly rising . If  the tumor marker continues to rise would recommend PET scan. Will discuss with Dr.Crystal re: RT. discussed with patient she agrees.   # Nausea vomiting diarrhea-resolved. Question viral infection. Recommend going back to Abema 150 BID.   # Fatty liver- sec to DM/obseity. Monitor for now.   # Poorly controlled Blood glucose- fasting- 247; awaiting follow up with PCP.   # Renal insufficiency- creatinine slightly elevated at 1.2; improved today.    # follow up in 4 weeks/labs/faslodex   # I reviewed the blood work- with the patient in detail; also reviewed the imaging independently [as summarized above]; and with the patient in detail.   Cc; Dr.rabinowitz.

## 2017-06-07 NOTE — Progress Notes (Signed)
Patient here for breast cancer follow-up. She is currently taking the verernzio once daily.

## 2017-06-07 NOTE — Patient Instructions (Signed)

## 2017-06-07 NOTE — Progress Notes (Signed)
[place] OFFICE PROGRESS NOTE  Patient Care Team: Karen Kitchens, MD as PCP - General (Family Medicine) Neldon Mc, MD (General Surgery) Everlene Farrier, MD (Obstetrics and Gynecology) Noreene Filbert, MD Forest Gleason, MD (Unknown Physician Specialty)  Cancer Staging Carcinoma of lower-outer quadrant of right breast in female, estrogen receptor positive [facility]) Staging form: Breast, AJCC 7th Edition - Pathologic: ypT1c,ypN2a, MX - Signed by Haywood Lasso, MD on 03/10/2012    Oncology History   # DEC 2012- RIGHT BREAST CA T2 N2 M0 tumor from biopsy.  Estrogen receptor positive, Progesterone receptor positive.  Current receptor negative by FISH 2. Neoadjuvant chemotherapy started in December of 28 with Cytoxan Adriamycin 3. Started on Taxol weekly chemotherapy. 4. Patient finished 12  cycles of Taxol chemotherapy in May of 2013.     5. Status post right modified radical mastectomy [Dr.Bowers; [place]] June of 2013, ypT1c  yp N2  MO. started also on Lerazole    7. Radiation therapy to the right breast (September of 2013).  Lymph node was positive for HER-2 receptor gene amplification of 2.22.  Will proceed with Herceptin treatment starting in September of 2013.   8.Patient has finished Herceptin (maintenance therapy) in August of 2014 8. Start patient on letrozole from November, 2013. 9. Patient started on Herceptin in September 2013.   10Patient finished 12 months of Herceptin therapy on August, 2014  # 6. Status post left side prophylactic mastectomy.  #Late MAY 2018-RECURRENCE BREAST CA- ER POS; PR-NEG; Her-Pending  [elevated Tumor marker- CT/PET- uptake in Right Mediastinal LN; Sternum [June 2018 EBUS- Dr.Kasa]   # March 10 2017- faslodex + Abema; OCT 5th CT-PR of mediastinal LN     Carcinoma of lower-outer quadrant of right breast in female, estrogen receptor positive ([place])    INTERVAL HISTORY:  Kimberly [name] 61 y.o.  female pleasant  patient above history of Recently diagnosed stage IV breast cancer ER/PR positive ? HER-2/neu- is here for follow-up. Patient is currently on Faslodex plus Abemaciclib [since June 2018]- is here to review the results of her restaging CAT scan.   Patient's appetite is good. She is currently taking Verzinio 1 pill a day because of a recent episode of nausea vomiting diarrhea. No more episodes of rectal bleeding.  No nausea no vomiting. Diarrhea resolved. Denies any headaches. No fevers or chills.  REVIEW OF SYSTEMS:  A complete 10 point review of system is done which is negative except mentioned above/history of present illness.   PAST MEDICAL HISTORY :  Past Medical History:  Diagnosis Date  . Arthritis   . Cancer of lower-outer quadrant of female breast ([place]) 08/06/2011   RIGHT   . Diabetes mellitus ([place]) 03/24/2012  . High cholesterol   . History of kidney stones   . Hx of bilateral breast implants   . Hypertension   . PONV (postoperative nausea and vomiting)   . Type II diabetes mellitus ([facility])    fasting 140-150  . Vertigo    LAST WEEK  . Vertigo 01/2017    PAST SURGICAL HISTORY :   Past Surgical History:  Procedure Laterality Date  . BREAST BIOPSY  07/2011   right  . BREAST RECONSTRUCTION  03/07/2012   Procedure: BREAST RECONSTRUCTION;  Surgeon: Crissie Reese, MD;  Location: [place];  Service: Plastics;  Laterality: Left;  BREAST RECONSTRUCTION WITH PLACEMENT OF TISSUE EXPANDER TO LEFT BREAST  . BREAST RECONSTRUCTION  02/06/2013  . CESAREAN SECTION  [id number]  . ENDOBRONCHIAL ULTRASOUND N/A  02/24/2017   Procedure: ENDOBRONCHIAL ULTRASOUND;  Surgeon: [name], MD;  Location: [facility];  Service: Cardiopulmonary;  Laterality: N/A;  . LATISSIMUS FLAP TO BREAST Right 02/06/2013   Procedure: LATISSIMUS FLAP TO RIGHT BREAST WITH IMPLANT;  Surgeon: Crissie Reese, MD;  Location: [place];  Service: Plastics;  Laterality: Right;  . MASTECTOMY  03/07/12   modified right; total left  . MODIFIED  MASTECTOMY  03/07/2012   Procedure: MODIFIED MASTECTOMY;  Surgeon: Haywood Lasso, MD;  Location: [place];  Service: General;  Laterality: Right;  . PORTACATH PLACEMENT  07/2011  . SCAR REVISION  03/30/2012   Procedure: SCAR REVISION;  Surgeon: Haywood Lasso, MD;  Location: [place];  Service: General;  Laterality: Right;  CLOSURE OF MASTECTOMY INCISION  . WISDOM TOOTH EXTRACTION      FAMILY HISTORY :   Family History  Problem Relation Age of Onset  . Cancer Mother        breast    SOCIAL HISTORY:   Social History  Substance Use Topics  . Smoking status: Never Smoker  . Smokeless tobacco: Never Used  . Alcohol use No    ALLERGIES:  is allergic to sulfa antibiotics.  MEDICATIONS:  Current Outpatient Prescriptions  Medication Sig Dispense Refill  . Abemaciclib 150 MG TABS Take 150 mg by mouth every 12 (twelve) hours. DO NOT START UNTIL EVALUATED OKAY by MD. (Patient taking differently: Take 150 mg by mouth every 12 (twelve) hours. ) 60 tablet 6  . atorvastatin (LIPITOR) 20 MG tablet Take 20 mg by mouth every morning.    . Calcium Carbonate-Vitamin D (CALCIUM 600 + D PO) Take 1 tablet by mouth 2 (two) times daily.    . fulvestrant (FASLODEX) 250 MG/5ML injection Inject into the muscle every 30 (thirty) days. One injection each buttock over 1-2 minutes. Warm prior to use.    Marland Kitchen glipiZIDE (GLUCOTROL) 10 MG tablet Take 10 mg by mouth daily before breakfast.    . lisinopril (PRINIVIL,ZESTRIL) 10 MG tablet Take 10 mg by mouth every morning.     . pantoprazole (PROTONIX) 40 MG tablet Take 1 tablet (40 mg total) by mouth daily. 30 mins prior to meals; On empty stomach 30 tablet 0  . prochlorperazine (COMPAZINE) 10 MG tablet Take 1 tablet (10 mg total) by mouth every 6 (six) hours as needed for nausea or vomiting. 30 tablet 0  . Semaglutide (OZEMPIC) 0.25 or 0.5 MG/DOSE SOPN Inject 0.25 mg into the skin once a week.    . loperamide (IMODIUM A-D) 2 MG tablet Take 2 mg by  mouth 4 (four) times daily as needed for diarrhea or loose stools.    . ondansetron (ZOFRAN) 8 MG tablet Take 1 tablet (8 mg total) by mouth every 8 (eight) hours as needed for nausea or vomiting. (Patient not taking: Reported on 06/07/2017) 20 tablet 3   No current facility-administered medications for this visit.     PHYSICAL EXAMINATION: ECOG PERFORMANCE STATUS: 0 - Asymptomatic  BP (!) 156/74 (BP Location: Left Arm, Patient Position: Sitting)   Pulse 86   Temp 97.6 F (36.4 C) (Tympanic)   Resp 20   Ht '5\' 2"'  (1.575 m)   Wt 182 lb 15.7 oz (83 kg)   BMI 33.47 kg/m   Filed Weights   06/07/17 0835  Weight: 182 lb 15.7 oz (83 kg)    GENERAL: Well-nourished well-developed; Alert, no distress and comfortable.   Accompanied by her husband. EYES: no pallor or icterus  OROPHARYNX: no thrush or ulceration; good dentition  NECK: supple, no masses felt LYMPH:  no palpable lymphadenopathy in the cervical, axillary or inguinal regions LUNGS: clear to auscultation and  No wheeze or crackles HEART/CVS: regular rate & rhythm and no murmurs; No lower extremity edema ABDOMEN:abdomen soft, Nontender and nondistended. Bowel sounds present.  Musculoskeletal:no cyanosis of digits and no clubbing  PSYCH: alert & oriented x 3 with fluent speech NEURO: no focal motor/sensory deficits SKIN:  no rashes or significant lesions;  LABORATORY DATA:  I have reviewed the data as listed    Component Value Date/Time   NA 133 (L) 06/07/2017 0819   NA 135 11/28/2014 1401   K 4.5 06/07/2017 0819   K 4.7 11/28/2014 1401   CL 99 (L) 06/07/2017 0819   CL 99 (L) 11/28/2014 1401   CO2 25 06/07/2017 0819   CO2 28 11/28/2014 1401   GLUCOSE 247 (H) 06/07/2017 0819   GLUCOSE 215 (H) 11/28/2014 1401   BUN 16 06/07/2017 0819   BUN 16 11/28/2014 1401   CREATININE 1.05 (H) 06/07/2017 0819   CREATININE 0.96 11/28/2014 1401   CALCIUM 9.1 06/07/2017 0819   CALCIUM 9.9 11/28/2014 1401   PROT 7.2 06/07/2017 0819    PROT 7.6 11/28/2014 1401   ALBUMIN 3.8 06/07/2017 0819   ALBUMIN 4.5 11/28/2014 1401   AST 35 06/07/2017 0819   AST 26 11/28/2014 1401   ALT 28 06/07/2017 0819   ALT 28 11/28/2014 1401   ALKPHOS 59 06/07/2017 0819   ALKPHOS 78 11/28/2014 1401   BILITOT 0.3 06/07/2017 0819   BILITOT 0.5 11/28/2014 1401   GFRNONAA 56 (L) 06/07/2017 0819   GFRNONAA >60 11/28/2014 1401   GFRAA >60 06/07/2017 0819   GFRAA >60 11/28/2014 1401    No results found for: SPEP, UPEP  Lab Results  Component Value Date   WBC 3.9 06/07/2017   NEUTROABS 2.1 06/07/2017   HGB 10.7 (L) 06/07/2017   HCT 30.9 (L) 06/07/2017   MCV 91.8 06/07/2017   PLT 367 06/07/2017      Chemistry      Component Value Date/Time   NA 133 (L) 06/07/2017 0819   NA 135 11/28/2014 1401   K 4.5 06/07/2017 0819   K 4.7 11/28/2014 1401   CL 99 (L) 06/07/2017 0819   CL 99 (L) 11/28/2014 1401   CO2 25 06/07/2017 0819   CO2 28 11/28/2014 1401   BUN 16 06/07/2017 0819   BUN 16 11/28/2014 1401   CREATININE 1.05 (H) 06/07/2017 0819   CREATININE 0.96 11/28/2014 1401      Component Value Date/Time   CALCIUM 9.1 06/07/2017 0819   CALCIUM 9.9 11/28/2014 1401   ALKPHOS 59 06/07/2017 0819   ALKPHOS 78 11/28/2014 1401   AST 35 06/07/2017 0819   AST 26 11/28/2014 1401   ALT 28 06/07/2017 0819   ALT 28 11/28/2014 1401   BILITOT 0.3 06/07/2017 0819   BILITOT 0.5 11/28/2014 1401     Results for Kimberly [name], Kimberly [name] (MRN [id number]) as of 06/07/2017 08:35  Ref. Range 07/06/2016 10:21 01/04/2017 08:22 01/19/2017 13:18 03/08/2017 10:49 05/10/2017 08:07  CA 27.29 Latest Ref Range: 0.0 - 38.6 U/mL 30.0 46.2 (H) 52.1 (H)    CA 27.29 Latest Ref Range: 0.0 - 38.6 U/mL    47.3 (H) 56.9 (H)    RADIOGRAPHIC STUDIES: I have personally reviewed the radiological images as listed and agreed with the findings in the report. No results found.   ASSESSMENT & PLAN:  Carcinoma of lower-outer quadrant of right breast in female, estrogen receptor positive  ([place]) STAGE IV.-Recurrent/metastatic ER positive/PR negative HER-2/neu-? breast cancer [biopsy-proven-mediastinal lymph node; in suff for her 2 tetsing].    # Patient currently on Faslodex; on Abema. CT scan shows PR of mediastinal LN; however tumor marker slightly rising . If  the tumor marker continues to rise would recommend PET scan. Will discuss with Dr.Crystal re: RT. discussed with patient she agrees.   # Nausea vomiting diarrhea-resolved. Question viral infection. Recommend going back to Abema 150 BID.   # Fatty liver- sec to DM/obseity. Monitor for now.   # Poorly controlled Blood glucose- fasting- 247; awaiting follow up with PCP.   # Renal insufficiency- creatinine slightly elevated at 1.2; improved today.    # follow up in 4 weeks/labs/faslodex   # I reviewed the blood work- with the patient in detail; also reviewed the imaging independently [as summarized above]; and with the patient in detail.   Cc; Dr.rabinowitz.    Orders Placed This Encounter  Procedures  . CBC with Differential/Platelet    Standing Status:   Future    Standing Expiration Date:   06/07/2018  . Comprehensive metabolic panel    Standing Status:   Future    Standing Expiration Date:   06/07/2018  . Cancer antigen 27.29    Standing Status:   Future    Standing Expiration Date:   06/07/2018   All questions were answered. The patient knows to call the clinic with any problems, questions or concerns.      [name], MD 06/07/2017 11:55 AM

## 2017-06-08 LAB — CANCER ANTIGEN 27.29: CA 27.29: 56.4 U/mL — ABNORMAL HIGH (ref 0.0–38.6)

## 2017-06-16 ENCOUNTER — Telehealth: Payer: Self-pay | Admitting: *Deleted

## 2017-06-16 DIAGNOSIS — T451X5A Adverse effect of antineoplastic and immunosuppressive drugs, initial encounter: Principal | ICD-10-CM

## 2017-06-16 DIAGNOSIS — D6481 Anemia due to antineoplastic chemotherapy: Secondary | ICD-10-CM

## 2017-06-16 NOTE — Telephone Encounter (Signed)
Per Dr B, check CBC, CMP and find out how her sugars have been doing. Phone call to patient who states she has not been checking her sugars, she takes an injection once a week. I asked she check them once, preferably twice a day and keep a record of the readings, She agreed to this. She wants to go to [facility] lab in the morning first thing. Scheduled her fro then

## 2017-06-16 NOTE — Telephone Encounter (Signed)
Reports that she has no energy and is shortness of breath when walking from room to room. Asking if it is possible that she is developing MDS like her mother did. She has doubled her dose of Verzenio recently. Please advise

## 2017-06-17 ENCOUNTER — Inpatient Hospital Stay: Payer: BLUE CROSS/BLUE SHIELD

## 2017-06-17 ENCOUNTER — Telehealth: Payer: Self-pay | Admitting: *Deleted

## 2017-06-17 DIAGNOSIS — C50511 Malignant neoplasm of lower-outer quadrant of right female breast: Secondary | ICD-10-CM | POA: Diagnosis not present

## 2017-06-17 DIAGNOSIS — T451X5A Adverse effect of antineoplastic and immunosuppressive drugs, initial encounter: Principal | ICD-10-CM

## 2017-06-17 DIAGNOSIS — D6481 Anemia due to antineoplastic chemotherapy: Secondary | ICD-10-CM

## 2017-06-17 LAB — CBC WITH DIFFERENTIAL/PLATELET
BASOS ABS: 0.4 10*3/uL — AB (ref 0–0.1)
BASOS PCT: 8 %
EOS PCT: 9 %
Eosinophils Absolute: 0.4 10*3/uL (ref 0–0.7)
HCT: 36.2 % (ref 35.0–47.0)
Hemoglobin: 12.4 g/dL (ref 12.0–16.0)
LYMPHS PCT: 46 %
Lymphs Abs: 2.2 10*3/uL (ref 1.0–3.6)
MCH: 31.9 pg (ref 26.0–34.0)
MCHC: 34.2 g/dL (ref 32.0–36.0)
MCV: 93.3 fL (ref 80.0–100.0)
MONO ABS: 0.2 10*3/uL (ref 0.2–0.9)
Monocytes Relative: 5 %
Neutro Abs: 1.5 10*3/uL (ref 1.4–6.5)
Neutrophils Relative %: 32 %
PLATELETS: 375 10*3/uL (ref 150–440)
RBC: 3.88 MIL/uL (ref 3.80–5.20)
RDW: 17.9 % — AB (ref 11.5–14.5)
WBC: 4.6 10*3/uL (ref 3.6–11.0)

## 2017-06-17 LAB — COMPREHENSIVE METABOLIC PANEL
ALT: 26 U/L (ref 14–54)
AST: 36 U/L (ref 15–41)
Albumin: 4.2 g/dL (ref 3.5–5.0)
Alkaline Phosphatase: 58 U/L (ref 38–126)
Anion gap: 11 (ref 5–15)
BUN: 22 mg/dL — AB (ref 6–20)
CHLORIDE: 98 mmol/L — AB (ref 101–111)
CO2: 23 mmol/L (ref 22–32)
Calcium: 9.7 mg/dL (ref 8.9–10.3)
Creatinine, Ser: 1.32 mg/dL — ABNORMAL HIGH (ref 0.44–1.00)
GFR calc Af Amer: 49 mL/min — ABNORMAL LOW (ref >60)
GFR, EST NON AFRICAN AMERICAN: 42 mL/min — AB (ref >60)
Glucose, Bld: 207 mg/dL — ABNORMAL HIGH (ref 65–99)
POTASSIUM: 4.5 mmol/L (ref 3.5–5.1)
Sodium: 132 mmol/L — ABNORMAL LOW (ref 135–145)
Total Bilirubin: 0.8 mg/dL (ref 0.3–1.2)
Total Protein: 8 g/dL (ref 6.5–8.1)

## 2017-06-17 NOTE — Telephone Encounter (Signed)
Contacted patient - reveiwed labs with patient and also with Sonia Baller, NP. Labs demonstrate much improvement. Creatinine level slightly elevated at 1.32.  Reassured patient that labs are stable and do not indicate MDS. Pt reports chronic fatigue with ADLS. Discussed nursing interventions to conserve energy. Encouraged the use of a chair while performing routine tasks. Pt also encouraged to increase po fluid intake. Discussed that last ct scan of chest was stable-but did demonstrate emphysema changes. She is not currently on an inhaler. An apt offered in symptom mgmt clinic today to evaluate patient's shortness of breath, but pt declined. She states that she will keep her apt in November with Dr. Rogue Bussing. I asked the patient to call our office back should her symptoms of shortness of breath becomes worse. She gave verbal understanding of the plan of care.

## 2017-06-17 NOTE — Telephone Encounter (Signed)
reviewed labs- see new phone note

## 2017-06-20 ENCOUNTER — Other Ambulatory Visit: Payer: Self-pay | Admitting: Internal Medicine

## 2017-07-05 ENCOUNTER — Telehealth: Payer: Self-pay | Admitting: Internal Medicine

## 2017-07-05 ENCOUNTER — Inpatient Hospital Stay: Payer: BLUE CROSS/BLUE SHIELD | Attending: Internal Medicine | Admitting: Internal Medicine

## 2017-07-05 ENCOUNTER — Inpatient Hospital Stay: Payer: BLUE CROSS/BLUE SHIELD

## 2017-07-05 ENCOUNTER — Ambulatory Visit: Payer: BLUE CROSS/BLUE SHIELD | Admitting: Internal Medicine

## 2017-07-05 ENCOUNTER — Other Ambulatory Visit: Payer: BLUE CROSS/BLUE SHIELD

## 2017-07-05 VITALS — BP 111/74 | HR 102 | Temp 96.5°F | Resp 18 | Wt 180.3 lb

## 2017-07-05 DIAGNOSIS — R42 Dizziness and giddiness: Secondary | ICD-10-CM | POA: Insufficient documentation

## 2017-07-05 DIAGNOSIS — R5383 Other fatigue: Secondary | ICD-10-CM | POA: Insufficient documentation

## 2017-07-05 DIAGNOSIS — E119 Type 2 diabetes mellitus without complications: Secondary | ICD-10-CM

## 2017-07-05 DIAGNOSIS — Z79899 Other long term (current) drug therapy: Secondary | ICD-10-CM | POA: Insufficient documentation

## 2017-07-05 DIAGNOSIS — R0602 Shortness of breath: Secondary | ICD-10-CM | POA: Diagnosis not present

## 2017-07-05 DIAGNOSIS — Z9221 Personal history of antineoplastic chemotherapy: Secondary | ICD-10-CM | POA: Diagnosis not present

## 2017-07-05 DIAGNOSIS — C50511 Malignant neoplasm of lower-outer quadrant of right female breast: Secondary | ICD-10-CM | POA: Insufficient documentation

## 2017-07-05 DIAGNOSIS — M129 Arthropathy, unspecified: Secondary | ICD-10-CM | POA: Diagnosis not present

## 2017-07-05 DIAGNOSIS — Z923 Personal history of irradiation: Secondary | ICD-10-CM | POA: Insufficient documentation

## 2017-07-05 DIAGNOSIS — N183 Chronic kidney disease, stage 3 (moderate): Secondary | ICD-10-CM

## 2017-07-05 DIAGNOSIS — I129 Hypertensive chronic kidney disease with stage 1 through stage 4 chronic kidney disease, or unspecified chronic kidney disease: Secondary | ICD-10-CM | POA: Diagnosis not present

## 2017-07-05 DIAGNOSIS — Z170 Estrogen receptor positive status [ER+]: Secondary | ICD-10-CM | POA: Insufficient documentation

## 2017-07-05 DIAGNOSIS — Z87442 Personal history of urinary calculi: Secondary | ICD-10-CM | POA: Diagnosis not present

## 2017-07-05 DIAGNOSIS — E7800 Pure hypercholesterolemia, unspecified: Secondary | ICD-10-CM | POA: Diagnosis not present

## 2017-07-05 DIAGNOSIS — Z7984 Long term (current) use of oral hypoglycemic drugs: Secondary | ICD-10-CM | POA: Insufficient documentation

## 2017-07-05 DIAGNOSIS — Z9013 Acquired absence of bilateral breasts and nipples: Secondary | ICD-10-CM | POA: Insufficient documentation

## 2017-07-05 DIAGNOSIS — R110 Nausea: Secondary | ICD-10-CM

## 2017-07-05 DIAGNOSIS — T451X5A Adverse effect of antineoplastic and immunosuppressive drugs, initial encounter: Secondary | ICD-10-CM

## 2017-07-05 LAB — COMPREHENSIVE METABOLIC PANEL
ALBUMIN: 3.7 g/dL (ref 3.5–5.0)
ALK PHOS: 46 U/L (ref 38–126)
ALT: 25 U/L (ref 14–54)
AST: 39 U/L (ref 15–41)
Anion gap: 9 (ref 5–15)
BUN: 22 mg/dL — AB (ref 6–20)
CALCIUM: 9.5 mg/dL (ref 8.9–10.3)
CO2: 23 mmol/L (ref 22–32)
CREATININE: 1.27 mg/dL — AB (ref 0.44–1.00)
Chloride: 98 mmol/L — ABNORMAL LOW (ref 101–111)
GFR calc Af Amer: 51 mL/min — ABNORMAL LOW (ref >60)
GFR calc non Af Amer: 44 mL/min — ABNORMAL LOW (ref >60)
GLUCOSE: 197 mg/dL — AB (ref 65–99)
Potassium: 4.3 mmol/L (ref 3.5–5.1)
SODIUM: 130 mmol/L — AB (ref 135–145)
Total Bilirubin: 0.5 mg/dL (ref 0.3–1.2)
Total Protein: 6.9 g/dL (ref 6.5–8.1)

## 2017-07-05 LAB — CBC WITH DIFFERENTIAL/PLATELET
BASOS PCT: 3 %
Basophils Absolute: 0.1 10*3/uL (ref 0–0.1)
EOS ABS: 0.1 10*3/uL (ref 0–0.7)
EOS PCT: 3 %
HEMATOCRIT: 31.9 % — AB (ref 35.0–47.0)
Hemoglobin: 10.9 g/dL — ABNORMAL LOW (ref 12.0–16.0)
Lymphocytes Relative: 45 %
Lymphs Abs: 1.5 10*3/uL (ref 1.0–3.6)
MCH: 32.7 pg (ref 26.0–34.0)
MCHC: 34.1 g/dL (ref 32.0–36.0)
MCV: 95.8 fL (ref 80.0–100.0)
MONO ABS: 0.2 10*3/uL (ref 0.2–0.9)
MONOS PCT: 5 %
Neutro Abs: 1.5 10*3/uL (ref 1.4–6.5)
Neutrophils Relative %: 44 %
Platelets: 300 10*3/uL (ref 150–440)
RBC: 3.33 MIL/uL — ABNORMAL LOW (ref 3.80–5.20)
RDW: 15.7 % — AB (ref 11.5–14.5)
WBC: 3.5 10*3/uL — ABNORMAL LOW (ref 3.6–11.0)

## 2017-07-05 LAB — TSH: TSH: 4.74 u[IU]/mL — ABNORMAL HIGH (ref 0.350–4.500)

## 2017-07-05 MED ORDER — FULVESTRANT 250 MG/5ML IM SOLN
500.0000 mg | Freq: Once | INTRAMUSCULAR | Status: AC
  Administered 2017-07-05: 500 mg via INTRAMUSCULAR

## 2017-07-05 MED ORDER — ONDANSETRON HCL 8 MG PO TABS: 8.0000 mg | ORAL_TABLET | Freq: Three times a day (TID) | ORAL | 3 refills | Status: DC | PRN

## 2017-07-05 NOTE — Progress Notes (Signed)
[place] OFFICE PROGRESS NOTE  Patient Care Team: Karen Kitchens, MD as PCP - General (Family Medicine) Neldon Mc, MD (General Surgery) Everlene Farrier, MD (Obstetrics and Gynecology) Noreene Filbert, MD Forest Gleason, MD (Unknown Physician Specialty)  Cancer Staging Carcinoma of lower-outer quadrant of right breast in female, estrogen receptor positive [facility]) Staging form: Breast, AJCC 7th Edition - Pathologic: ypT1c,ypN2a, MX - Signed by Haywood Lasso, MD on 03/10/2012    Oncology History   # DEC 2012- RIGHT BREAST CA T2 N2 M0 tumor from biopsy.  Estrogen receptor positive, Progesterone receptor positive.  Current receptor negative by FISH 2. Neoadjuvant chemotherapy started in December of 28 with Cytoxan Adriamycin 3. Started on Taxol weekly chemotherapy. 4. Patient finished 12  cycles of Taxol chemotherapy in May of 2013.     5. Status post right modified radical mastectomy [Dr.Bowers; [place]] June of 2013, ypT1c  yp N2  MO. started also on Lerazole    7. Radiation therapy to the right breast (September of 2013).  Lymph node was positive for HER-2 receptor gene amplification of 2.22.  Will proceed with Herceptin treatment starting in September of 2013.   8.Patient has finished Herceptin (maintenance therapy) in August of 2014 8. Start patient on letrozole from November, 2013. 9. Patient started on Herceptin in September 2013.   10Patient finished 12 months of Herceptin therapy on August, 2014  # 6. Status post left side prophylactic mastectomy.  #Late MAY 2018-RECURRENCE BREAST CA- ER POS; PR-NEG; Her-Pending  [elevated Tumor marker- CT/PET- uptake in Right Mediastinal LN; Sternum [June 2018 EBUS- Dr.Kasa]   # March 10 2017- faslodex + Abema; OCT 5th CT-PR of mediastinal LN     Carcinoma of lower-outer quadrant of right breast in female, estrogen receptor positive ([place])    INTERVAL HISTORY:  Kimberly [name] 61 y.o.  female pleasant  patient above history of Recently diagnosed stage IV breast cancer ER/PR positive ? HER-2/neu- is here for follow-up. Patient is currently on Faslodex plus Abemaciclib [since June 2018]- is here for follow-up. Patient is currently on abema 150 twice a day.  Patient complains of extreme fatigue which is her limiting her daily activities. She cannot walk in the closest or for prolonged period; her husband feels that she is short of breath walking from one end of the room today. She is not short of breath at rest. No cough.  She states her blood sugars are better controlled. She  admits to intermittent nausea. No significant vomiting. No more rectal bleeding. No significant diarrhea.  Denies any headaches. No fevers or chills.  REVIEW OF SYSTEMS:  A complete 10 point review of system is done which is negative except mentioned above/history of present illness.   PAST MEDICAL HISTORY :  Past Medical History:  Diagnosis Date  . Arthritis   . Cancer of lower-outer quadrant of female breast ([place]) 08/06/2011   RIGHT   . Diabetes mellitus ([place]) 03/24/2012  . High cholesterol   . History of kidney stones   . Hx of bilateral breast implants   . Hypertension   . PONV (postoperative nausea and vomiting)   . Type II diabetes mellitus ([facility])    fasting 140-150  . Vertigo    LAST WEEK  . Vertigo 01/2017    PAST SURGICAL HISTORY :   Past Surgical History:  Procedure Laterality Date  . BREAST BIOPSY  07/2011   right  . BREAST RECONSTRUCTION  02/06/2013  . CESAREAN SECTION  [id number]  .  MASTECTOMY  03/07/12   modified right; total left  . MODIFIED MASTECTOMY  03/07/2012   Procedure: MODIFIED MASTECTOMY;  Surgeon: Haywood Lasso, MD;  Location: [place];  Service: General;  Laterality: Right;  . PORTACATH PLACEMENT  07/2011  . WISDOM TOOTH EXTRACTION      FAMILY HISTORY :   Family History  Problem Relation Age of Onset  . Cancer Mother        breast    SOCIAL HISTORY:   Social History    Tobacco Use  . Smoking status: Never Smoker  . Smokeless tobacco: Never Used  Substance Use Topics  . Alcohol use: No  . Drug use: No    ALLERGIES:  is allergic to sulfa antibiotics.  MEDICATIONS:  Current Outpatient Medications  Medication Sig Dispense Refill  . Abemaciclib 150 MG TABS Take 150 mg by mouth every 12 (twelve) hours. DO NOT START UNTIL EVALUATED OKAY by MD. (Patient taking differently: Take 150 mg by mouth every 12 (twelve) hours. ) 60 tablet 6  . atorvastatin (LIPITOR) 20 MG tablet Take 20 mg by mouth every morning.    . Calcium Carbonate-Vitamin D (CALCIUM 600 + D PO) Take 1 tablet by mouth 2 (two) times daily.    . fulvestrant (FASLODEX) 250 MG/5ML injection Inject into the muscle every 30 (thirty) days. One injection each buttock over 1-2 minutes. Warm prior to use.    Marland Kitchen glipiZIDE (GLUCOTROL) 10 MG tablet Take 10 mg by mouth daily before breakfast.    . lisinopril (PRINIVIL,ZESTRIL) 10 MG tablet Take 10 mg by mouth every morning.     . ondansetron (ZOFRAN) 8 MG tablet Take 1 tablet (8 mg total) every 8 (eight) hours as needed by mouth for nausea or vomiting. 20 tablet 3  . Semaglutide (OZEMPIC) 0.25 or 0.5 MG/DOSE SOPN Inject 0.25 mg into the skin once a week.    . loperamide (IMODIUM A-D) 2 MG tablet Take 2 mg by mouth 4 (four) times daily as needed for diarrhea or loose stools.    . pantoprazole (PROTONIX) 40 MG tablet TAKE 1 TABLET (40 MG TOTAL) BY MOUTH DAILY. [place] ON EMPTY STOMACH (Patient not taking: Reported on 07/05/2017) 30 tablet 0  . prochlorperazine (COMPAZINE) 10 MG tablet Take 1 tablet (10 mg total) by mouth every 6 (six) hours as needed for nausea or vomiting. (Patient not taking: Reported on 07/05/2017) 30 tablet 0   No current facility-administered medications for this visit.     PHYSICAL EXAMINATION: ECOG PERFORMANCE STATUS: 0 - Asymptomatic  BP 111/74 (BP Location: Right Arm, Patient Position: Sitting)   Pulse (!) 102   Temp  (!) 96.5 F (35.8 C) (Tympanic)   Resp 18   Wt 180 lb 5.4 oz (81.8 kg)   BMI 32.98 kg/m   Filed Weights   07/05/17 0846  Weight: 180 lb 5.4 oz (81.8 kg)    GENERAL: Well-nourished well-developed; Alert, no distress and comfortable.   Accompanied by her husband. EYES: no pallor or icterus OROPHARYNX: no thrush or ulceration; good dentition  NECK: supple, no masses felt LYMPH:  no palpable lymphadenopathy in the cervical, axillary or inguinal regions LUNGS: clear to auscultation and  No wheeze or crackles HEART/CVS: regular rate & rhythm and no murmurs; No lower extremity edema ABDOMEN:abdomen soft, Nontender and nondistended. Bowel sounds present.  Musculoskeletal:no cyanosis of digits and no clubbing  PSYCH: alert & oriented x 3 with fluent speech NEURO: no focal motor/sensory deficits SKIN:  no  rashes or significant lesions;  LABORATORY DATA:  I have reviewed the data as listed    Component Value Date/Time   NA 130 (L) 07/05/2017 0820   NA 135 11/28/2014 1401   K 4.3 07/05/2017 0820   K 4.7 11/28/2014 1401   CL 98 (L) 07/05/2017 0820   CL 99 (L) 11/28/2014 1401   CO2 23 07/05/2017 0820   CO2 28 11/28/2014 1401   GLUCOSE 197 (H) 07/05/2017 0820   GLUCOSE 215 (H) 11/28/2014 1401   BUN 22 (H) 07/05/2017 0820   BUN 16 11/28/2014 1401   CREATININE 1.27 (H) 07/05/2017 0820   CREATININE 0.96 11/28/2014 1401   CALCIUM 9.5 07/05/2017 0820   CALCIUM 9.9 11/28/2014 1401   PROT 6.9 07/05/2017 0820   PROT 7.6 11/28/2014 1401   ALBUMIN 3.7 07/05/2017 0820   ALBUMIN 4.5 11/28/2014 1401   AST 39 07/05/2017 0820   AST 26 11/28/2014 1401   ALT 25 07/05/2017 0820   ALT 28 11/28/2014 1401   ALKPHOS 46 07/05/2017 0820   ALKPHOS 78 11/28/2014 1401   BILITOT 0.5 07/05/2017 0820   BILITOT 0.5 11/28/2014 1401   GFRNONAA 44 (L) 07/05/2017 0820   GFRNONAA >60 11/28/2014 1401   GFRAA 51 (L) 07/05/2017 0820   GFRAA >60 11/28/2014 1401    No results found for: SPEP, UPEP  Lab  Results  Component Value Date   WBC 3.5 (L) 07/05/2017   NEUTROABS 1.5 07/05/2017   HGB 10.9 (L) 07/05/2017   HCT 31.9 (L) 07/05/2017   MCV 95.8 07/05/2017   PLT 300 07/05/2017      Chemistry      Component Value Date/Time   NA 130 (L) 07/05/2017 0820   NA 135 11/28/2014 1401   K 4.3 07/05/2017 0820   K 4.7 11/28/2014 1401   CL 98 (L) 07/05/2017 0820   CL 99 (L) 11/28/2014 1401   CO2 23 07/05/2017 0820   CO2 28 11/28/2014 1401   BUN 22 (H) 07/05/2017 0820   BUN 16 11/28/2014 1401   CREATININE 1.27 (H) 07/05/2017 0820   CREATININE 0.96 11/28/2014 1401      Component Value Date/Time   CALCIUM 9.5 07/05/2017 0820   CALCIUM 9.9 11/28/2014 1401   ALKPHOS 46 07/05/2017 0820   ALKPHOS 78 11/28/2014 1401   AST 39 07/05/2017 0820   AST 26 11/28/2014 1401   ALT 25 07/05/2017 0820   ALT 28 11/28/2014 1401   BILITOT 0.5 07/05/2017 0820   BILITOT 0.5 11/28/2014 1401     Results for FOREST, PRUDEN (MRN [id number]) as of 06/07/2017 08:35  Ref. Range 07/06/2016 10:21 01/04/2017 08:22 01/19/2017 13:18 03/08/2017 10:49 05/10/2017 08:07  CA 27.29 Latest Ref Range: 0.0 - 38.6 U/mL 30.0 46.2 (H) 52.1 (H)    CA 27.29 Latest Ref Range: 0.0 - 38.6 U/mL    47.3 (H) 56.9 (H)    RADIOGRAPHIC STUDIES: I have personally reviewed the radiological images as listed and agreed with the findings in the report. No results found.   ASSESSMENT & PLAN:  Carcinoma of lower-outer quadrant of right breast in female, estrogen receptor positive ([place]) STAGE IV.-Recurrent/metastatic ER positive/PR negative HER-2/neu-? breast cancer [biopsy-proven-mediastinal lymph node; in suff for her 2 tetsing].OCT 5th   CT scan shows PR of mediastinal LN; however tumor marker slightly rising   # Patient currently on Faslodex; on Abema- tolerating poorly sec to extreme fatigue [see discussion below].   # Continue Faslodex today. HOLD abema- for the next 2 weeks.  We'll reevaluate- discussed the multiple options available  including dose reduction; switching to alternate CDK inhibitor- like ibrance/ Kisqalis. And also chemotherapy.  .   # Extreme Fatigue- ? Check TSH. HOLD Abema for 2 weeks.   #  Poorly controlled Blood glucose- improved  # CKD-III; creatinine 1.2. Stable.   # follow up in 2 weeks/ no labs- check on fatigue.    Orders Placed This Encounter  Procedures  . TSH    Standing Status:   Future    Number of Occurrences:   1    Standing Expiration Date:   07/05/2018   All questions were answered. The patient knows to call the clinic with any problems, questions or concerns.      [name], MD 07/05/2017 3:51 PM

## 2017-07-05 NOTE — Telephone Encounter (Signed)
Order has been entered

## 2017-07-05 NOTE — Progress Notes (Signed)
Here for follow up

## 2017-07-05 NOTE — Telephone Encounter (Signed)
Please  order thyroid profile for next visit in 2 weeks- Thx

## 2017-07-05 NOTE — Assessment & Plan Note (Addendum)
STAGE IV.-Recurrent/metastatic ER positive/PR negative HER-2/neu-? breast cancer [biopsy-proven-mediastinal lymph node; in suff for her 2 tetsing].OCT 5th   CT scan shows PR of mediastinal LN; however tumor marker slightly rising   # Patient currently on Faslodex; on Abema- tolerating poorly sec to extreme fatigue [see discussion below].   # Continue Faslodex today. HOLD abema- for the next 2 weeks. We'll reevaluate- discussed the multiple options available including dose reduction; switching to alternate CDK inhibitor- like ibrance/ Kisqalis. And also chemotherapy.  .   # Extreme Fatigue- ? Check TSH. HOLD Abema for 2 weeks.   #  Poorly controlled Blood glucose- improved  # CKD-III; creatinine 1.2. Stable.   # follow up in 2 weeks/ no labs- check on fatigue.  ADDENDUM: The patient's TSH is slightly abnormal at 4.7; we will check a thyroid profile at next visit- if still abnormal would recommend Synthroid.

## 2017-07-05 NOTE — Patient Instructions (Signed)

## 2017-07-05 NOTE — Addendum Note (Signed)
Addended by: Sandria Bales B on: 07/05/2017 04:05 PM   Modules accepted: Orders

## 2017-07-06 LAB — CANCER ANTIGEN 27.29: CA 27.29: 56.1 U/mL — ABNORMAL HIGH (ref 0.0–38.6)

## 2017-07-19 ENCOUNTER — Ambulatory Visit: Payer: BLUE CROSS/BLUE SHIELD | Admitting: Internal Medicine

## 2017-07-26 ENCOUNTER — Inpatient Hospital Stay (HOSPITAL_BASED_OUTPATIENT_CLINIC_OR_DEPARTMENT_OTHER): Payer: BLUE CROSS/BLUE SHIELD | Admitting: Internal Medicine

## 2017-07-26 DIAGNOSIS — E7800 Pure hypercholesterolemia, unspecified: Secondary | ICD-10-CM | POA: Diagnosis not present

## 2017-07-26 DIAGNOSIS — I129 Hypertensive chronic kidney disease with stage 1 through stage 4 chronic kidney disease, or unspecified chronic kidney disease: Secondary | ICD-10-CM

## 2017-07-26 DIAGNOSIS — Z170 Estrogen receptor positive status [ER+]: Secondary | ICD-10-CM | POA: Diagnosis not present

## 2017-07-26 DIAGNOSIS — N183 Chronic kidney disease, stage 3 (moderate): Secondary | ICD-10-CM | POA: Diagnosis not present

## 2017-07-26 DIAGNOSIS — M129 Arthropathy, unspecified: Secondary | ICD-10-CM | POA: Diagnosis not present

## 2017-07-26 DIAGNOSIS — Z923 Personal history of irradiation: Secondary | ICD-10-CM | POA: Diagnosis not present

## 2017-07-26 DIAGNOSIS — R42 Dizziness and giddiness: Secondary | ICD-10-CM

## 2017-07-26 DIAGNOSIS — E119 Type 2 diabetes mellitus without complications: Secondary | ICD-10-CM | POA: Diagnosis not present

## 2017-07-26 DIAGNOSIS — Z79899 Other long term (current) drug therapy: Secondary | ICD-10-CM

## 2017-07-26 DIAGNOSIS — R5383 Other fatigue: Secondary | ICD-10-CM | POA: Diagnosis not present

## 2017-07-26 DIAGNOSIS — Z7984 Long term (current) use of oral hypoglycemic drugs: Secondary | ICD-10-CM

## 2017-07-26 DIAGNOSIS — Z9221 Personal history of antineoplastic chemotherapy: Secondary | ICD-10-CM | POA: Diagnosis not present

## 2017-07-26 DIAGNOSIS — C50511 Malignant neoplasm of lower-outer quadrant of right female breast: Secondary | ICD-10-CM

## 2017-07-26 DIAGNOSIS — R0602 Shortness of breath: Secondary | ICD-10-CM

## 2017-07-26 DIAGNOSIS — Z87442 Personal history of urinary calculi: Secondary | ICD-10-CM

## 2017-07-26 DIAGNOSIS — Z9013 Acquired absence of bilateral breasts and nipples: Secondary | ICD-10-CM

## 2017-07-26 MED ORDER — ABEMACICLIB 100 MG PO TABS: 100.0000 mg | ORAL_TABLET | Freq: Two times a day (BID) | ORAL | 4 refills | Status: DC

## 2017-07-26 NOTE — Assessment & Plan Note (Addendum)
STAGE IV.-Recurrent/metastatic ER positive/PR negative HER-2/neu-? breast cancer [biopsy-proven-mediastinal lymph node; in suff for her 2 tetsing].OCT 5th   CT scan shows PR of mediastinal LN; however tumor marker slightly rising   # Patient currently on Faslodex; on Abema- tolerating poorly sec to extreme fatigue [see discussion below].   # start abema 150 mg/day; and then go to 100 mgBID [new script given]  # Extreme Fatigue-thyroid functions slightly abnormal TSH 4.7; awaiting the repeat thyroid profile at next visit/1 week.  # Poorly controlled Blood glucose- improved  # CKD-III; creatinine 1.2. Stable.  # 1 week- labs- [cbc/cmp/ca-27-29] shots/ follow up with me in 5 weeks/labs/shots

## 2017-07-26 NOTE — Progress Notes (Signed)
[facility] OFFICE PROGRESS NOTE  Patient Care Team: Karen Kitchens, MD as PCP - General (Family Medicine) Neldon Mc, MD (General Surgery) Everlene Farrier, MD (Obstetrics and Gynecology) Noreene Filbert, MD Forest Gleason, MD (Unknown Physician Specialty)  Cancer Staging Carcinoma of lower-outer quadrant of right breast in female, estrogen receptor positive [facility]) Staging form: Breast, AJCC 7th Edition - Pathologic: ypT1c,ypN2a, MX - Signed by Haywood Lasso, MD on 03/10/2012    Oncology History   # DEC 2012- RIGHT BREAST CA T2 N2 M0 tumor from biopsy.  Estrogen receptor positive, Progesterone receptor positive.  Current receptor negative by FISH 2. Neoadjuvant chemotherapy started in December of 28 with Cytoxan Adriamycin 3. Started on Taxol weekly chemotherapy. 4. Patient finished 12  cycles of Taxol chemotherapy in May of 2013.     5. Status post right modified radical mastectomy [Dr.Bowers; [place]] June of 2013, ypT1c  yp N2  MO. started also on Lerazole    7. Radiation therapy to the right breast (September of 2013).  Lymph node was positive for HER-2 receptor gene amplification of 2.22.  Will proceed with Herceptin treatment starting in September of 2013.   8.Patient has finished Herceptin (maintenance therapy) in August of 2014 8. Start patient on letrozole from November, 2013. 9. Patient started on Herceptin in September 2013.   10Patient finished 12 months of Herceptin therapy on August, 2014  # 6. Status post left side prophylactic mastectomy.  #Late MAY 2018-RECURRENCE BREAST CA- ER POS; PR-NEG; Her-Pending  [elevated Tumor marker- CT/PET- uptake in Right Mediastinal LN; Sternum [June 2018 EBUS- Dr.Kasa]   # March 10 2017- faslodex + Abema; OCT 5th CT-PR of mediastinal LN     Carcinoma of lower-outer quadrant of right breast in female, estrogen receptor positive ([place])    INTERVAL HISTORY:  Kimberly [name] 61 y.o.  female [place]  patient above history of Recently diagnosed stage IV breast cancer ER/PR positive ? HER-2/neu- is here for follow-up. Patient is currently on Faslodex plus Abemaciclib [since June 2018]- is here for follow-up. Patient's abema is currently on hold for the last 2 weeks because of extreme fatigue.  Patient knows to have improvement of the fatigue since holding her chemotherapy pill. She is not short of breath. She is not coughing.  She states her blood sugars are better controlled. Denies any nausea vomiting.. No more rectal bleeding. No significant diarrhea.  Denies any headaches. No fevers or chills.  REVIEW OF SYSTEMS:  A complete 10 point review of system is done which is negative except mentioned above/history of present illness.   PAST MEDICAL HISTORY :  Past Medical History:  Diagnosis Date  . Arthritis   . Cancer of lower-outer quadrant of female breast ([place]) 08/06/2011   RIGHT   . Diabetes mellitus ([place]) 03/24/2012  . High cholesterol   . History of kidney stones   . Hx of bilateral breast implants   . Hypertension   . PONV (postoperative nausea and vomiting)   . Type II diabetes mellitus ([facility])    fasting 140-150  . Vertigo    LAST WEEK  . Vertigo 01/2017    PAST SURGICAL HISTORY :   Past Surgical History:  Procedure Laterality Date  . BREAST BIOPSY  07/2011   right  . BREAST RECONSTRUCTION  03/07/2012   Procedure: BREAST RECONSTRUCTION;  Surgeon: Crissie Reese, MD;  Location: [facility];  Service: Plastics;  Laterality: Left;  BREAST RECONSTRUCTION WITH PLACEMENT OF TISSUE EXPANDER TO LEFT BREAST  .  BREAST RECONSTRUCTION  02/06/2013  . CESAREAN SECTION  [id number]  . ENDOBRONCHIAL ULTRASOUND N/A 02/24/2017   Procedure: ENDOBRONCHIAL ULTRASOUND;  Surgeon: [name], MD;  Location: [facility];  Service: Cardiopulmonary;  Laterality: N/A;  . LATISSIMUS FLAP TO BREAST Right 02/06/2013   Procedure: LATISSIMUS FLAP TO RIGHT BREAST WITH IMPLANT;  Surgeon: Crissie Reese, MD;  Location: [facility];  Service: Plastics;  Laterality: Right;  . MASTECTOMY  03/07/12   modified right; total left  . MODIFIED MASTECTOMY  03/07/2012   Procedure: MODIFIED MASTECTOMY;  Surgeon: Haywood Lasso, MD;  Location: [facility];  Service: General;  Laterality: Right;  . PORTACATH PLACEMENT  07/2011  . SCAR REVISION  03/30/2012   Procedure: SCAR REVISION;  Surgeon: Haywood Lasso, MD;  Location: [facility];  Service: General;  Laterality: Right;  CLOSURE OF MASTECTOMY INCISION  . WISDOM TOOTH EXTRACTION      FAMILY HISTORY :   Family History  Problem Relation Age of Onset  . Cancer Mother        breast    SOCIAL HISTORY:   Social History   Tobacco Use  . Smoking status: Never Smoker  . Smokeless tobacco: Never Used  Substance Use Topics  . Alcohol use: No  . Drug use: No    ALLERGIES:  is allergic to sulfa antibiotics.  MEDICATIONS:  Current Outpatient Medications  Medication Sig Dispense Refill  . abemaciclib (VERZENIO) 100 MG tablet Take 1 tablet (100 mg total) by mouth 2 (two) times daily. 60 tablet 4  . atorvastatin (LIPITOR) 20 MG tablet Take 20 mg by mouth every morning.    . Calcium Carbonate-Vitamin D (CALCIUM 600 + D PO) Take 1 tablet by mouth 2 (two) times daily.    . fulvestrant (FASLODEX) 250 MG/5ML injection Inject into the muscle every 30 (thirty) days. One injection each buttock over 1-2 minutes. Warm prior to use.    Marland Kitchen glipiZIDE (GLUCOTROL) 10 MG tablet Take 10 mg by mouth daily before breakfast.    . lisinopril (PRINIVIL,ZESTRIL) 10 MG tablet Take 10 mg by mouth every morning.     . loperamide (IMODIUM A-D) 2 MG tablet Take 2 mg by mouth 4 (four) times daily as needed for diarrhea or loose stools.    . ondansetron (ZOFRAN) 8 MG tablet Take 1 tablet (8 mg total) every 8 (eight) hours as needed by mouth for nausea or vomiting. 20 tablet 3  . pantoprazole (PROTONIX) 40 MG tablet TAKE 1 TABLET (40 MG TOTAL) BY MOUTH DAILY. [place] ON EMPTY  STOMACH (Patient not taking: Reported on 07/05/2017) 30 tablet 0  . prochlorperazine (COMPAZINE) 10 MG tablet Take 1 tablet (10 mg total) by mouth every 6 (six) hours as needed for nausea or vomiting. (Patient not taking: Reported on 07/05/2017) 30 tablet 0  . Semaglutide (OZEMPIC) 0.25 or 0.5 MG/DOSE SOPN Inject 0.25 mg into the skin once a week.     No current facility-administered medications for this visit.     PHYSICAL EXAMINATION: ECOG PERFORMANCE STATUS: 0 - Asymptomatic  There were no vitals taken for this visit.  There were no vitals filed for this visit.  GENERAL: Well-nourished well-developed; Alert, no distress and comfortable.   Accompanied by her husband. EYES: no pallor or icterus OROPHARYNX: no thrush or ulceration; good dentition  NECK: supple, no masses felt LYMPH:  no palpable lymphadenopathy in the cervical, axillary or inguinal regions LUNGS: clear to auscultation and  No wheeze or crackles  HEART/CVS: regular rate & rhythm and no murmurs; No lower extremity edema ABDOMEN:abdomen soft, Nontender and nondistended. Bowel sounds present.  Musculoskeletal:no cyanosis of digits and no clubbing  PSYCH: alert & oriented x 3 with fluent speech NEURO: no focal motor/sensory deficits SKIN:  no rashes or significant lesions;  LABORATORY DATA:  I have reviewed the data as listed    Component Value Date/Time   NA 130 (L) 07/05/2017 0820   NA 135 11/28/2014 1401   K 4.3 07/05/2017 0820   K 4.7 11/28/2014 1401   CL 98 (L) 07/05/2017 0820   CL 99 (L) 11/28/2014 1401   CO2 23 07/05/2017 0820   CO2 28 11/28/2014 1401   GLUCOSE 197 (H) 07/05/2017 0820   GLUCOSE 215 (H) 11/28/2014 1401   BUN 22 (H) 07/05/2017 0820   BUN 16 11/28/2014 1401   CREATININE 1.27 (H) 07/05/2017 0820   CREATININE 0.96 11/28/2014 1401   CALCIUM 9.5 07/05/2017 0820   CALCIUM 9.9 11/28/2014 1401   PROT 6.9 07/05/2017 0820   PROT 7.6 11/28/2014 1401   ALBUMIN 3.7 07/05/2017 0820   ALBUMIN 4.5  11/28/2014 1401   AST 39 07/05/2017 0820   AST 26 11/28/2014 1401   ALT 25 07/05/2017 0820   ALT 28 11/28/2014 1401   ALKPHOS 46 07/05/2017 0820   ALKPHOS 78 11/28/2014 1401   BILITOT 0.5 07/05/2017 0820   BILITOT 0.5 11/28/2014 1401   GFRNONAA 44 (L) 07/05/2017 0820   GFRNONAA >60 11/28/2014 1401   GFRAA 51 (L) 07/05/2017 0820   GFRAA >60 11/28/2014 1401    No results found for: SPEP, UPEP  Lab Results  Component Value Date   WBC 3.5 (L) 07/05/2017   NEUTROABS 1.5 07/05/2017   HGB 10.9 (L) 07/05/2017   HCT 31.9 (L) 07/05/2017   MCV 95.8 07/05/2017   PLT 300 07/05/2017      Chemistry      Component Value Date/Time   NA 130 (L) 07/05/2017 0820   NA 135 11/28/2014 1401   K 4.3 07/05/2017 0820   K 4.7 11/28/2014 1401   CL 98 (L) 07/05/2017 0820   CL 99 (L) 11/28/2014 1401   CO2 23 07/05/2017 0820   CO2 28 11/28/2014 1401   BUN 22 (H) 07/05/2017 0820   BUN 16 11/28/2014 1401   CREATININE 1.27 (H) 07/05/2017 0820   CREATININE 0.96 11/28/2014 1401      Component Value Date/Time   CALCIUM 9.5 07/05/2017 0820   CALCIUM 9.9 11/28/2014 1401   ALKPHOS 46 07/05/2017 0820   ALKPHOS 78 11/28/2014 1401   AST 39 07/05/2017 0820   AST 26 11/28/2014 1401   ALT 25 07/05/2017 0820   ALT 28 11/28/2014 1401   BILITOT 0.5 07/05/2017 0820   BILITOT 0.5 11/28/2014 1401     Results for AMARISE, LILLO (MRN [id number]) as of 06/07/2017 08:35  Ref. Range 07/06/2016 10:21 01/04/2017 08:22 01/19/2017 13:18 03/08/2017 10:49 05/10/2017 08:07  CA 27.29 Latest Ref Range: 0.0 - 38.6 U/mL 30.0 46.2 (H) 52.1 (H)    CA 27.29 Latest Ref Range: 0.0 - 38.6 U/mL    47.3 (H) 56.9 (H)    RADIOGRAPHIC STUDIES: I have personally reviewed the radiological images as listed and agreed with the findings in the report. No results found.   ASSESSMENT & PLAN:  Carcinoma of lower-outer quadrant of right breast in female, estrogen receptor positive ([place]) STAGE IV.-Recurrent/metastatic ER positive/PR  negative HER-2/neu-? breast cancer [biopsy-proven-mediastinal lymph node; in suff for her 2  tetsing].OCT 5th   CT scan shows PR of mediastinal LN; however tumor marker slightly rising   # Patient currently on Faslodex; on Abema- tolerating poorly sec to extreme fatigue [see discussion below].   # start abema 150 mg/day; and then go to 100 mgBID [new script given]  # Extreme Fatigue-thyroid functions slightly abnormal TSH 4.7; awaiting the repeat thyroid profile at next visit/1 week.  # Poorly controlled Blood glucose- improved  # CKD-III; creatinine 1.2. Stable.  # 1 week- labs- [cbc/cmp/ca-27-29] shots/ follow up with me in 5 weeks/labs/shots    Orders Placed This Encounter  Procedures  . CBC with Differential/Platelet    Standing Status:   Future    Standing Expiration Date:   07/26/2018  . Comprehensive metabolic panel    Standing Status:   Future    Standing Expiration Date:   07/26/2018  . Cancer antigen 27.29    Standing Status:   Future    Standing Expiration Date:   07/26/2018  . CBC with Differential/Platelet    Standing Status:   Future    Standing Expiration Date:   07/26/2018  . Comprehensive metabolic panel    Standing Status:   Future    Standing Expiration Date:   07/26/2018  . Cancer antigen 27.29    Standing Status:   Future    Standing Expiration Date:   07/26/2018   All questions were answered. The patient knows to call the clinic with any problems, questions or concerns.      [name], MD 07/26/2017 7:30 PM

## 2017-07-26 NOTE — Progress Notes (Signed)
Patient is here today for a follow up. Patient states no new concerns today.  

## 2017-07-27 ENCOUNTER — Telehealth: Payer: Self-pay | Admitting: Pharmacist

## 2017-07-27 DIAGNOSIS — C50511 Malignant neoplasm of lower-outer quadrant of right female breast: Secondary | ICD-10-CM

## 2017-07-27 DIAGNOSIS — Z170 Estrogen receptor positive status [ER+]: Secondary | ICD-10-CM

## 2017-07-27 MED ORDER — ABEMACICLIB 100 MG PO TABS: 100.0000 mg | ORAL_TABLET | Freq: Two times a day (BID) | ORAL | 4 refills | Status: DC

## 2017-07-27 NOTE — Telephone Encounter (Signed)
Oral Oncology Pharmacist Encounter  Received refill prescription for Verzenio (abemaciclib) for the treatment of metastatic breast cancer, planned duration until disease progression or unacceptable drug toxicity. Medication started 02/2017. She is being dose reduced secondary to intolerance.   CBC/LFTs from 07/05/17 assessed, no relevant lab abnormalities. Prescription dose and frequency assessed.   Current medication list in Epic reviewed, no DDIs with Verzenio identified.  Prescription has been e-scribed to the [facility] and we will see if the insurance company will let it be filled there.  Oral Oncology Clinic will continue to follow patient.   Darl Pikes, PharmD, BCPS Hematology/Oncology Clinical Pharmacist [facility] [phone]  07/27/2017 2:26 PM

## 2017-07-28 ENCOUNTER — Encounter: Payer: Self-pay | Admitting: Pharmacist

## 2017-07-28 MED ORDER — ABEMACICLIB 100 MG PO TABS: 100.0000 mg | ORAL_TABLET | Freq: Two times a day (BID) | ORAL | 4 refills | Status: DC

## 2017-07-28 MED FILL — VERZENIO 100 MG TAB: 100 | 28 days supply | Qty: 56 | Fill #0

## 2017-07-28 NOTE — Telephone Encounter (Signed)
Oral Chemotherapy Pharmacist Encounter  I spoke with patient for overview of refill dose reduced prescription for Verzenio (abemciclib)for the treatment of metastatic breast cancer in planned duration of until disease progression or unacceptable drug toxicity. Medication started 02/2017.  Pt was previously filling at an outside pharmacy but the medication is now being filled at [facility]  Pt is doing well. On the 150mg  bid dosing she was fatigue and had occasional diarrhea. Both improved while she was on a treatment break. She has loperamide on hand to handle any diarrhea that occurs.   Reviewed administration, dosing, side effects, monitoring, drug-food interactions, safe handling, storage, and disposal. Patient will take 1 tablet (100 mg total) by mouth 2 (two) times daily.  She is currently taking 150mg  daily until she gets her new prescription in the mail on 11/30 or 12/3.  Side effects include but not limited to: diarrhea, fatigue, N/V, abdominal pain.    Reviewed with patient importance of keeping a medication schedule and plan for any missed doses.  Ms. Lampton voiced understanding and appreciation. All questions answered. Place a medication handout and business cards for myself and Tonye Pearson, patient advocate in the mail.  Patient knows to call the office with questions or concerns. Oral Oncology Clinic will continue to follow.  Thank you,  Darl Pikes, PharmD, BCPS Hematology/Oncology Clinical Pharmacist [facility] [phone]  07/28/2017 11:07 AM

## 2017-07-28 NOTE — Telephone Encounter (Signed)
Erroneous Encounter

## 2017-07-28 NOTE — Telephone Encounter (Signed)
Oral Oncology Patient Advocate Encounter  Called patient to let her know that we were sending her prescription to [facility] her co-pay is 10.00. She has a co-pay card thru the manufacture.  Sent e-mail to pharmacy to fill her Verzenio and gave them her card number over the phone.   ID # [id number] Group # [id number] BIN # [id number] PCN # [id number]    [place] Patient Advocate [phone] 07/28/2017 11:10 AM

## 2017-07-28 NOTE — Addendum Note (Signed)
Addended by: Darl Pikes on: 07/28/2017 10:48 AM   Modules accepted: Orders

## 2017-07-29 NOTE — Telephone Encounter (Signed)
Oral Oncology Patient Advocate Encounter  Patients Verzenio was mailed out 07/28/2017 from [facility].   [place] Patient Advocate [phone] 07/29/2017 2:58 PM

## 2017-08-02 ENCOUNTER — Inpatient Hospital Stay: Payer: BLUE CROSS/BLUE SHIELD | Attending: Internal Medicine

## 2017-08-02 DIAGNOSIS — C50511 Malignant neoplasm of lower-outer quadrant of right female breast: Secondary | ICD-10-CM | POA: Diagnosis present

## 2017-08-02 DIAGNOSIS — Z79818 Long term (current) use of other agents affecting estrogen receptors and estrogen levels: Secondary | ICD-10-CM | POA: Diagnosis not present

## 2017-08-02 DIAGNOSIS — Z170 Estrogen receptor positive status [ER+]: Secondary | ICD-10-CM | POA: Diagnosis not present

## 2017-08-02 LAB — CBC WITH DIFFERENTIAL/PLATELET
Basophils Absolute: 0.1 10*3/uL (ref 0–0.1)
Basophils Relative: 2 %
EOS ABS: 0.3 10*3/uL (ref 0–0.7)
EOS PCT: 5 %
HCT: 32.5 % — ABNORMAL LOW (ref 35.0–47.0)
Hemoglobin: 11.3 g/dL — ABNORMAL LOW (ref 12.0–16.0)
LYMPHS ABS: 2 10*3/uL (ref 1.0–3.6)
LYMPHS PCT: 36 %
MCH: 32.8 pg (ref 26.0–34.0)
MCHC: 34.8 g/dL (ref 32.0–36.0)
MCV: 94.4 fL (ref 80.0–100.0)
Monocytes Absolute: 0.3 10*3/uL (ref 0.2–0.9)
Monocytes Relative: 6 %
Neutro Abs: 2.9 10*3/uL (ref 1.4–6.5)
Neutrophils Relative %: 51 %
PLATELETS: 317 10*3/uL (ref 150–440)
RBC: 3.44 MIL/uL — AB (ref 3.80–5.20)
RDW: 13.9 % (ref 11.5–14.5)
WBC: 5.5 10*3/uL (ref 3.6–11.0)

## 2017-08-02 LAB — COMPREHENSIVE METABOLIC PANEL
ALBUMIN: 3.9 g/dL (ref 3.5–5.0)
ALT: 26 U/L (ref 14–54)
AST: 36 U/L (ref 15–41)
Alkaline Phosphatase: 53 U/L (ref 38–126)
Anion gap: 11 (ref 5–15)
BILIRUBIN TOTAL: 0.7 mg/dL (ref 0.3–1.2)
BUN: 19 mg/dL (ref 6–20)
CHLORIDE: 98 mmol/L — AB (ref 101–111)
CO2: 21 mmol/L — ABNORMAL LOW (ref 22–32)
CREATININE: 1.18 mg/dL — AB (ref 0.44–1.00)
Calcium: 8.7 mg/dL — ABNORMAL LOW (ref 8.9–10.3)
GFR calc Af Amer: 56 mL/min — ABNORMAL LOW (ref >60)
GFR, EST NON AFRICAN AMERICAN: 48 mL/min — AB (ref >60)
GLUCOSE: 192 mg/dL — AB (ref 65–99)
POTASSIUM: 3.9 mmol/L (ref 3.5–5.1)
Sodium: 130 mmol/L — ABNORMAL LOW (ref 135–145)
TOTAL PROTEIN: 6.9 g/dL (ref 6.5–8.1)

## 2017-08-02 MED ORDER — FULVESTRANT 250 MG/5ML IM SOLN
500.0000 mg | Freq: Once | INTRAMUSCULAR | Status: AC
  Administered 2017-08-02: 500 mg via INTRAMUSCULAR

## 2017-08-02 MED ORDER — FULVESTRANT 250 MG/5ML IM SOLN
INTRAMUSCULAR | Status: AC
  Filled 2017-08-02: qty 5

## 2017-08-02 NOTE — Patient Instructions (Signed)

## 2017-08-03 ENCOUNTER — Telehealth: Payer: Self-pay | Admitting: *Deleted

## 2017-08-03 LAB — THYROID PANEL WITH TSH
Free Thyroxine Index: 2.1 (ref 1.2–4.9)
T3 Uptake Ratio: 25 % (ref 24–39)
T4 TOTAL: 8.2 ug/dL (ref 4.5–12.0)
TSH: 5.19 u[IU]/mL — AB (ref 0.450–4.500)

## 2017-08-03 LAB — CANCER ANTIGEN 27.29: CAN 27.29: 53 U/mL — AB (ref 0.0–38.6)

## 2017-08-03 NOTE — Telephone Encounter (Signed)
-----   Message from [name], MD sent at 08/03/2017  8:36 AM EST ----- Please inform patient tumor marker is stable/slightly better compared to last month.  Follow-up as planned

## 2017-08-03 NOTE — Telephone Encounter (Signed)
Call attempt to patient- left vm.

## 2017-08-04 ENCOUNTER — Other Ambulatory Visit: Payer: Self-pay | Admitting: *Deleted

## 2017-08-04 ENCOUNTER — Telehealth: Payer: Self-pay | Admitting: *Deleted

## 2017-08-04 MED ORDER — LEVOTHYROXINE SODIUM 25 MCG PO TABS: 25.0000 ug | ORAL_TABLET | Freq: Every day | ORAL | 3 refills | Status: DC

## 2017-08-04 NOTE — Telephone Encounter (Signed)
Pt aware-new rx sent-pt education performed on synthroid dosing-side effects and to take med in am on empty stomach 1 hr before food. Teach back process performed with patient.

## 2017-08-04 NOTE — Progress Notes (Signed)
Spoke with patient- new rx sent to pharmacy

## 2017-08-04 NOTE — Telephone Encounter (Signed)
-----   Message from [name], MD sent at 08/04/2017  4:20 PM EST ----- Please inform patient that TSH continues to be slightly abnormal; recommend Synthroid 25 mcg on empty stomach in the morning [please give 30 pills with 3 refills]. Thx

## 2017-08-19 MED FILL — VERZENIO 100 MG TAB: 100 | 28 days supply | Qty: 56 | Fill #1

## 2017-09-06 ENCOUNTER — Inpatient Hospital Stay: Payer: BLUE CROSS/BLUE SHIELD

## 2017-09-06 ENCOUNTER — Inpatient Hospital Stay: Payer: BLUE CROSS/BLUE SHIELD | Attending: Internal Medicine | Admitting: Internal Medicine

## 2017-09-06 VITALS — BP 127/85 | HR 94 | Temp 97.4°F | Resp 16 | Wt 183.0 lb

## 2017-09-06 DIAGNOSIS — Z5111 Encounter for antineoplastic chemotherapy: Secondary | ICD-10-CM | POA: Insufficient documentation

## 2017-09-06 DIAGNOSIS — R5383 Other fatigue: Secondary | ICD-10-CM | POA: Insufficient documentation

## 2017-09-06 DIAGNOSIS — Z170 Estrogen receptor positive status [ER+]: Secondary | ICD-10-CM | POA: Insufficient documentation

## 2017-09-06 DIAGNOSIS — C50511 Malignant neoplasm of lower-outer quadrant of right female breast: Secondary | ICD-10-CM

## 2017-09-06 DIAGNOSIS — E119 Type 2 diabetes mellitus without complications: Secondary | ICD-10-CM | POA: Diagnosis not present

## 2017-09-06 DIAGNOSIS — N183 Chronic kidney disease, stage 3 (moderate): Secondary | ICD-10-CM | POA: Diagnosis not present

## 2017-09-06 LAB — COMPREHENSIVE METABOLIC PANEL
ALT: 23 U/L (ref 14–54)
AST: 36 U/L (ref 15–41)
Albumin: 4 g/dL (ref 3.5–5.0)
Alkaline Phosphatase: 51 U/L (ref 38–126)
Anion gap: 11 (ref 5–15)
BILIRUBIN TOTAL: 0.5 mg/dL (ref 0.3–1.2)
BUN: 17 mg/dL (ref 6–20)
CHLORIDE: 96 mmol/L — AB (ref 101–111)
CO2: 24 mmol/L (ref 22–32)
CREATININE: 1.18 mg/dL — AB (ref 0.44–1.00)
Calcium: 9.8 mg/dL (ref 8.9–10.3)
GFR, EST AFRICAN AMERICAN: 56 mL/min — AB (ref >60)
GFR, EST NON AFRICAN AMERICAN: 48 mL/min — AB (ref >60)
Glucose, Bld: 222 mg/dL — ABNORMAL HIGH (ref 65–99)
POTASSIUM: 4.5 mmol/L (ref 3.5–5.1)
Sodium: 131 mmol/L — ABNORMAL LOW (ref 135–145)
TOTAL PROTEIN: 7.7 g/dL (ref 6.5–8.1)

## 2017-09-06 LAB — CBC WITH DIFFERENTIAL/PLATELET
BASOS ABS: 0.1 10*3/uL (ref 0–0.1)
Basophils Relative: 2 %
EOS ABS: 0.1 10*3/uL (ref 0–0.7)
EOS PCT: 2 %
HCT: 35.5 % (ref 35.0–47.0)
HEMOGLOBIN: 12.2 g/dL (ref 12.0–16.0)
LYMPHS ABS: 1.8 10*3/uL (ref 1.0–3.6)
LYMPHS PCT: 51 %
MCH: 32.9 pg (ref 26.0–34.0)
MCHC: 34.4 g/dL (ref 32.0–36.0)
MCV: 95.8 fL (ref 80.0–100.0)
Monocytes Absolute: 0.2 10*3/uL (ref 0.2–0.9)
Monocytes Relative: 6 %
NEUTROS PCT: 39 %
Neutro Abs: 1.4 10*3/uL (ref 1.4–6.5)
PLATELETS: 334 10*3/uL (ref 150–440)
RBC: 3.7 MIL/uL — AB (ref 3.80–5.20)
RDW: 14.2 % (ref 11.5–14.5)
WBC: 3.5 10*3/uL — AB (ref 3.6–11.0)

## 2017-09-06 MED ORDER — FULVESTRANT 250 MG/5ML IM SOLN
500.0000 mg | Freq: Once | INTRAMUSCULAR | Status: AC
  Administered 2017-09-06: 500 mg via INTRAMUSCULAR

## 2017-09-06 NOTE — Assessment & Plan Note (Addendum)
STAGE IV.-Recurrent/metastatic ER positive/PR negative HER-2/neu-? breast cancer [biopsy-proven-mediastinal lymph node; in suff for her 2 tetsing].OCT 5th  CT scan shows PR of mediastinal LN; however tumor marker stable ~50s.   # Patient currently on Faslodex; on Abema- 100 BID tolerating  Better; Continue 100 mg BID abema; clinically no evidence of progression.  Will order CT scan today.  # Extreme Fatigue-thyroid functions slightly abnormal TSH 4.7; on synthroid 25 mcg/day.  Overall improved.  Suspect from abema more than hypothyroidism.  Will check TSH again at next visit  # Poorly controlled Blood glucose- improved; FBS- 222.   # CKD-III; creatinine 1.2. Stable.  #  [cbc/cmp/ca-27-29; Thyroid profile] shots/ follow up with me in 4 weeks; CT scans prior.

## 2017-09-06 NOTE — Progress Notes (Signed)
[place] OFFICE PROGRESS NOTE  Patient Care Team: Karen Kitchens, MD as PCP - General (Family Medicine) Neldon Mc, MD (General Surgery) Everlene Farrier, MD (Obstetrics and Gynecology) Noreene Filbert, MD Forest Gleason, MD (Unknown Physician Specialty)  Cancer Staging Carcinoma of lower-outer quadrant of right breast in female, estrogen receptor positive [facility]) Staging form: Breast, AJCC 7th Edition - Pathologic: ypT1c,ypN2a, MX - Signed by Haywood Lasso, MD on 03/10/2012    Oncology History   # DEC 2012- RIGHT BREAST CA T2 N2 M0 tumor from biopsy.  Estrogen receptor positive, Progesterone receptor positive.  Current receptor negative by FISH 2. Neoadjuvant chemotherapy started in December of 28 with Cytoxan Adriamycin 3. Started on Taxol weekly chemotherapy. 4. Patient finished 12  cycles of Taxol chemotherapy in May of 2013.     5. Status post right modified radical mastectomy [Dr.Bowers; [place]] June of 2013, ypT1c  yp N2  MO. started also on Lerazole    7. Radiation therapy to the right breast (September of 2013).  Lymph node was positive for HER-2 receptor gene amplification of 2.22.  Will proceed with Herceptin treatment starting in September of 2013.   8.Patient has finished Herceptin (maintenance therapy) in August of 2014 8. Start patient on letrozole from November, 2013. 9. Patient started on Herceptin in September 2013.   10Patient finished 12 months of Herceptin therapy on August, 2014  # 6. Status post left side prophylactic mastectomy.  #Late MAY 2018-RECURRENCE BREAST CA- ER POS; PR-NEG; Her-Pending  [elevated Tumor marker- CT/PET- uptake in Right Mediastinal LN; Sternum [June 2018 EBUS- Dr.Kasa]   # March 10 2017- faslodex + Abema; OCT 5th CT-PR of mediastinal LN     Carcinoma of lower-outer quadrant of right breast in female, estrogen receptor positive ([place])    INTERVAL HISTORY:  Kimberly [name] 62 y.o.  female pleasant  patient above history of stage IV breast cancer ER/PR positive ? HER-2/neu- is here for follow-up. Patient is currently on Faslodex plus Abemaciclib [since June 2018]- is here for follow-up.  Patient knows to have improvement of the fatigue decreasing the dose of her chemotherapy pill.  She is not short of breath. She is not coughing.  She is also taking Synthroid 25 mcg.  She states her blood sugars are better controlled. Denies any nausea vomiting.. No more rectal bleeding. No significant diarrhea.  Denies any headaches. No fevers or chills.  REVIEW OF SYSTEMS:  A complete 10 point review of system is done which is negative except mentioned above/history of present illness.   PAST MEDICAL HISTORY :  Past Medical History:  Diagnosis Date  . Arthritis   . Cancer of lower-outer quadrant of female breast ([place]) 08/06/2011   RIGHT   . Diabetes mellitus ([place]) 03/24/2012  . High cholesterol   . History of kidney stones   . Hx of bilateral breast implants   . Hypertension   . PONV (postoperative nausea and vomiting)   . Type II diabetes mellitus ([facility])    fasting 140-150  . Vertigo    LAST WEEK  . Vertigo 01/2017    PAST SURGICAL HISTORY :   Past Surgical History:  Procedure Laterality Date  . BREAST BIOPSY  07/2011   right  . BREAST RECONSTRUCTION  03/07/2012   Procedure: BREAST RECONSTRUCTION;  Surgeon: Crissie Reese, MD;  Location: [place];  Service: Plastics;  Laterality: Left;  BREAST RECONSTRUCTION WITH PLACEMENT OF TISSUE EXPANDER TO LEFT BREAST  . BREAST RECONSTRUCTION  02/06/2013  .  CESAREAN SECTION  [id number]  . ENDOBRONCHIAL ULTRASOUND N/A 02/24/2017   Procedure: ENDOBRONCHIAL ULTRASOUND;  Surgeon: [name], MD;  Location: [facility];  Service: Cardiopulmonary;  Laterality: N/A;  . LATISSIMUS FLAP TO BREAST Right 02/06/2013   Procedure: LATISSIMUS FLAP TO RIGHT BREAST WITH IMPLANT;  Surgeon: Crissie Reese, MD;  Location: [place];  Service: Plastics;  Laterality: Right;  . MASTECTOMY   03/07/12   modified right; total left  . MODIFIED MASTECTOMY  03/07/2012   Procedure: MODIFIED MASTECTOMY;  Surgeon: Haywood Lasso, MD;  Location: [place];  Service: General;  Laterality: Right;  . PORTACATH PLACEMENT  07/2011  . SCAR REVISION  03/30/2012   Procedure: SCAR REVISION;  Surgeon: Haywood Lasso, MD;  Location: [place];  Service: General;  Laterality: Right;  CLOSURE OF MASTECTOMY INCISION  . WISDOM TOOTH EXTRACTION      FAMILY HISTORY :   Family History  Problem Relation Age of Onset  . Cancer Mother        breast    SOCIAL HISTORY:   Social History   Tobacco Use  . Smoking status: Never Smoker  . Smokeless tobacco: Never Used  Substance Use Topics  . Alcohol use: No  . Drug use: No    ALLERGIES:  is allergic to sulfa antibiotics.  MEDICATIONS:  Current Outpatient Medications  Medication Sig Dispense Refill  . abemaciclib (VERZENIO) 100 MG tablet Take 1 tablet (100 mg total) by mouth 2 (two) times daily. 56 tablet 4  . atorvastatin (LIPITOR) 20 MG tablet Take 20 mg by mouth every morning.    . Calcium Carbonate-Vitamin D (CALCIUM 600 + D PO) Take 1 tablet by mouth 2 (two) times daily.    . fulvestrant (FASLODEX) 250 MG/5ML injection Inject into the muscle every 30 (thirty) days. One injection each buttock over 1-2 minutes. Warm prior to use.    Marland Kitchen glipiZIDE (GLUCOTROL) 10 MG tablet Take 10 mg by mouth daily before breakfast.    . levothyroxine (SYNTHROID) 25 MCG tablet Take 1 tablet (25 mcg total) by mouth daily before breakfast. 30 tablet 3  . lisinopril (PRINIVIL,ZESTRIL) 10 MG tablet Take 10 mg by mouth every morning.     . loperamide (IMODIUM A-D) 2 MG tablet Take 2 mg by mouth 4 (four) times daily as needed for diarrhea or loose stools.    . ondansetron (ZOFRAN) 8 MG tablet Take 1 tablet (8 mg total) every 8 (eight) hours as needed by mouth for nausea or vomiting. 20 tablet 3  . pantoprazole (PROTONIX) 40 MG tablet TAKE 1 TABLET (40 MG  TOTAL) BY MOUTH DAILY. 30 MINS PRIOR TO MEALS ON EMPTY STOMACH 30 tablet 0  . prochlorperazine (COMPAZINE) 10 MG tablet Take 1 tablet (10 mg total) by mouth every 6 (six) hours as needed for nausea or vomiting. 30 tablet 0  . Semaglutide (OZEMPIC) 0.25 or 0.5 MG/DOSE SOPN Inject 0.25 mg into the skin once a week.     No current facility-administered medications for this visit.     PHYSICAL EXAMINATION: ECOG PERFORMANCE STATUS: 0 - Asymptomatic  BP 127/85 (BP Location: Left Arm, Patient Position: Sitting)   Pulse 94   Temp (!) 97.4 F (36.3 C) (Tympanic)   Resp 16   Wt 183 lb (83 kg)   BMI 33.47 kg/m   Filed Weights   09/06/17 0928 09/06/17 0933  Weight: 183 lb 6.8 oz (83.2 kg) 183 lb (83 kg)    GENERAL: Well-nourished well-developed; Alert, no distress  and comfortable.  She is alone. EYES: no pallor or icterus OROPHARYNX: no thrush or ulceration; good dentition  NECK: supple, no masses felt LYMPH:  no palpable lymphadenopathy in the cervical, axillary or inguinal regions LUNGS: clear to auscultation and  No wheeze or crackles HEART/CVS: regular rate & rhythm and no murmurs; No lower extremity edema ABDOMEN:abdomen soft, Nontender and nondistended. Bowel sounds present.  Musculoskeletal:no cyanosis of digits and no clubbing  PSYCH: alert & oriented x 3 with fluent speech NEURO: no focal motor/sensory deficits SKIN:  no rashes or significant lesions;  LABORATORY DATA:  I have reviewed the data as listed    Component Value Date/Time   NA 131 (L) 09/06/2017 0902   NA 135 11/28/2014 1401   K 4.5 09/06/2017 0902   K 4.7 11/28/2014 1401   CL 96 (L) 09/06/2017 0902   CL 99 (L) 11/28/2014 1401   CO2 24 09/06/2017 0902   CO2 28 11/28/2014 1401   GLUCOSE 222 (H) 09/06/2017 0902   GLUCOSE 215 (H) 11/28/2014 1401   BUN 17 09/06/2017 0902   BUN 16 11/28/2014 1401   CREATININE 1.18 (H) 09/06/2017 0902   CREATININE 0.96 11/28/2014 1401   CALCIUM 9.8 09/06/2017 0902   CALCIUM  9.9 11/28/2014 1401   PROT 7.7 09/06/2017 0902   PROT 7.6 11/28/2014 1401   ALBUMIN 4.0 09/06/2017 0902   ALBUMIN 4.5 11/28/2014 1401   AST 36 09/06/2017 0902   AST 26 11/28/2014 1401   ALT 23 09/06/2017 0902   ALT 28 11/28/2014 1401   ALKPHOS 51 09/06/2017 0902   ALKPHOS 78 11/28/2014 1401   BILITOT 0.5 09/06/2017 0902   BILITOT 0.5 11/28/2014 1401   GFRNONAA 48 (L) 09/06/2017 0902   GFRNONAA >60 11/28/2014 1401   GFRAA 56 (L) 09/06/2017 0902   GFRAA >60 11/28/2014 1401    No results found for: SPEP, UPEP  Lab Results  Component Value Date   WBC 3.5 (L) 09/06/2017   NEUTROABS 1.4 09/06/2017   HGB 12.2 09/06/2017   HCT 35.5 09/06/2017   MCV 95.8 09/06/2017   PLT 334 09/06/2017      Chemistry      Component Value Date/Time   NA 131 (L) 09/06/2017 0902   NA 135 11/28/2014 1401   K 4.5 09/06/2017 0902   K 4.7 11/28/2014 1401   CL 96 (L) 09/06/2017 0902   CL 99 (L) 11/28/2014 1401   CO2 24 09/06/2017 0902   CO2 28 11/28/2014 1401   BUN 17 09/06/2017 0902   BUN 16 11/28/2014 1401   CREATININE 1.18 (H) 09/06/2017 0902   CREATININE 0.96 11/28/2014 1401      Component Value Date/Time   CALCIUM 9.8 09/06/2017 0902   CALCIUM 9.9 11/28/2014 1401   ALKPHOS 51 09/06/2017 0902   ALKPHOS 78 11/28/2014 1401   AST 36 09/06/2017 0902   AST 26 11/28/2014 1401   ALT 23 09/06/2017 0902   ALT 28 11/28/2014 1401   BILITOT 0.5 09/06/2017 0902   BILITOT 0.5 11/28/2014 1401     Results for JANNELLE, NOTARO (MRN [id number]) as of 06/07/2017 08:35  Ref. Range 07/06/2016 10:21 01/04/2017 08:22 01/19/2017 13:18 03/08/2017 10:49 05/10/2017 08:07  CA 27.29 Latest Ref Range: 0.0 - 38.6 U/mL 30.0 46.2 (H) 52.1 (H)    CA 27.29 Latest Ref Range: 0.0 - 38.6 U/mL    47.3 (H) 56.9 (H)    RADIOGRAPHIC STUDIES: I have personally reviewed the radiological images as listed and agreed with the findings  in the report. No results found.   ASSESSMENT & PLAN:  Carcinoma of lower-outer quadrant of  right breast in female, estrogen receptor positive ([place]) STAGE IV.-Recurrent/metastatic ER positive/PR negative HER-2/neu-? breast cancer [biopsy-proven-mediastinal lymph node; in suff for her 2 tetsing].OCT 5th  CT scan shows PR of mediastinal LN; however tumor marker stable ~50s.   # Patient currently on Faslodex; on Abema- 100 BID tolerating  Better; Continue 100 mg BID abema; clinically no evidence of progression.  Will order CT scan today.  # Extreme Fatigue-thyroid functions slightly abnormal TSH 4.7; on synthroid 25 mcg/day.  Overall improved.  Suspect from abema more than hypothyroidism.  Will check TSH again at next visit  # Poorly controlled Blood glucose- improved; FBS- 222.   # CKD-III; creatinine 1.2. Stable.  #  [cbc/cmp/ca-27-29; Thyroid profile] shots/ follow up with me in 4 weeks; CT scans prior.   Orders Placed This Encounter  Procedures  . CT CHEST W CONTRAST    Standing Status:   Future    Standing Expiration Date:   09/06/2018    Scheduling Instructions:     1-2 days prior to MD visit.    Order Specific Question:   If indicated for the ordered procedure, I authorize the administration of contrast media per Radiology protocol    Answer:   Yes    Order Specific Question:   Preferred imaging location?    Answer:   [facility]    Order Specific Question:   Radiology Contrast Protocol - do NOT remove file path    Answer:   [email or web address]  . CT Abdomen Pelvis W Contrast    Standing Status:   Future    Standing Expiration Date:   09/06/2018    Scheduling Instructions:     1-2 days prior to MD visit.    Order Specific Question:   If indicated for the ordered procedure, I authorize the administration of contrast media per Radiology protocol    Answer:   Yes    Order Specific Question:   Preferred imaging location?    Answer:   [facility]    Order Specific Question:   Radiology Contrast Protocol - do NOT remove file path     Answer:   [email or web address]   All questions were answered. The patient knows to call the clinic with any problems, questions or concerns.      [name], MD 09/06/2017 11:02 AM

## 2017-09-07 LAB — CANCER ANTIGEN 27.29: CA 27.29: 55 U/mL — ABNORMAL HIGH (ref 0.0–38.6)

## 2017-09-16 MED FILL — VERZENIO 100 MG TAB: 100 | 28 days supply | Qty: 56 | Fill #2

## 2017-09-27 ENCOUNTER — Other Ambulatory Visit: Payer: Self-pay | Admitting: *Deleted

## 2017-09-27 DIAGNOSIS — C50511 Malignant neoplasm of lower-outer quadrant of right female breast: Secondary | ICD-10-CM

## 2017-09-27 DIAGNOSIS — Z170 Estrogen receptor positive status [ER+]: Principal | ICD-10-CM

## 2017-09-30 ENCOUNTER — Ambulatory Visit
Admission: RE | Admit: 2017-09-30 | Discharge: 2017-09-30 | Disposition: A | Discharge status: Home / Self Care | Payer: BLUE CROSS/BLUE SHIELD | Source: Ambulatory Visit | Attending: Internal Medicine | Admitting: Internal Medicine

## 2017-09-30 ENCOUNTER — Telehealth: Payer: Self-pay | Admitting: Internal Medicine

## 2017-09-30 DIAGNOSIS — Z170 Estrogen receptor positive status [ER+]: Secondary | ICD-10-CM | POA: Insufficient documentation

## 2017-09-30 DIAGNOSIS — K8020 Calculus of gallbladder without cholecystitis without obstruction: Secondary | ICD-10-CM | POA: Diagnosis not present

## 2017-09-30 DIAGNOSIS — R590 Localized enlarged lymph nodes: Secondary | ICD-10-CM | POA: Diagnosis not present

## 2017-09-30 DIAGNOSIS — M899 Disorder of bone, unspecified: Secondary | ICD-10-CM | POA: Diagnosis not present

## 2017-09-30 DIAGNOSIS — I700 Atherosclerosis of aorta: Secondary | ICD-10-CM | POA: Insufficient documentation

## 2017-09-30 DIAGNOSIS — K760 Fatty (change of) liver, not elsewhere classified: Secondary | ICD-10-CM | POA: Diagnosis not present

## 2017-09-30 DIAGNOSIS — K5730 Diverticulosis of large intestine without perforation or abscess without bleeding: Secondary | ICD-10-CM | POA: Insufficient documentation

## 2017-09-30 DIAGNOSIS — C50511 Malignant neoplasm of lower-outer quadrant of right female breast: Secondary | ICD-10-CM | POA: Diagnosis not present

## 2017-09-30 MED ORDER — IOPAMIDOL (ISOVUE-300) INJECTION 61%
80.0000 mL | Freq: Once | INTRAVENOUS | Status: AC | PRN
  Administered 2017-09-30: 80 mL via INTRAVENOUS

## 2017-09-30 NOTE — Telephone Encounter (Signed)
CT scan- results reviewed with pt; follow up as planned. Dr.B

## 2017-10-04 ENCOUNTER — Inpatient Hospital Stay: Payer: BLUE CROSS/BLUE SHIELD | Attending: Internal Medicine | Admitting: Internal Medicine

## 2017-10-04 ENCOUNTER — Inpatient Hospital Stay: Payer: BLUE CROSS/BLUE SHIELD

## 2017-10-04 DIAGNOSIS — C771 Secondary and unspecified malignant neoplasm of intrathoracic lymph nodes: Secondary | ICD-10-CM | POA: Diagnosis not present

## 2017-10-04 DIAGNOSIS — Z5111 Encounter for antineoplastic chemotherapy: Secondary | ICD-10-CM | POA: Diagnosis present

## 2017-10-04 DIAGNOSIS — R5383 Other fatigue: Secondary | ICD-10-CM | POA: Diagnosis not present

## 2017-10-04 DIAGNOSIS — C50511 Malignant neoplasm of lower-outer quadrant of right female breast: Secondary | ICD-10-CM | POA: Diagnosis not present

## 2017-10-04 DIAGNOSIS — N183 Chronic kidney disease, stage 3 (moderate): Secondary | ICD-10-CM | POA: Diagnosis not present

## 2017-10-04 DIAGNOSIS — Z170 Estrogen receptor positive status [ER+]: Principal | ICD-10-CM

## 2017-10-04 LAB — COMPREHENSIVE METABOLIC PANEL
ALT: 30 U/L (ref 14–54)
AST: 41 U/L (ref 15–41)
Albumin: 3.9 g/dL (ref 3.5–5.0)
Alkaline Phosphatase: 53 U/L (ref 38–126)
Anion gap: 10 (ref 5–15)
BUN: 15 mg/dL (ref 6–20)
CHLORIDE: 100 mmol/L — AB (ref 101–111)
CO2: 22 mmol/L (ref 22–32)
CREATININE: 1.06 mg/dL — AB (ref 0.44–1.00)
Calcium: 9.5 mg/dL (ref 8.9–10.3)
GFR calc Af Amer: >60 mL/min (ref >60)
GFR, EST NON AFRICAN AMERICAN: 55 mL/min — AB (ref >60)
Glucose, Bld: 151 mg/dL — ABNORMAL HIGH (ref 65–99)
POTASSIUM: 4.5 mmol/L (ref 3.5–5.1)
Sodium: 132 mmol/L — ABNORMAL LOW (ref 135–145)
Total Bilirubin: 0.5 mg/dL (ref 0.3–1.2)
Total Protein: 7.7 g/dL (ref 6.5–8.1)

## 2017-10-04 LAB — CBC WITH DIFFERENTIAL/PLATELET
Basophils Absolute: 0.1 10*3/uL (ref 0–0.1)
Basophils Relative: 3 %
EOS ABS: 0.1 10*3/uL (ref 0–0.7)
Eosinophils Relative: 2 %
HCT: 34.1 % — ABNORMAL LOW (ref 35.0–47.0)
HEMOGLOBIN: 11.8 g/dL — AB (ref 12.0–16.0)
LYMPHS ABS: 1.8 10*3/uL (ref 1.0–3.6)
LYMPHS PCT: 49 %
MCH: 32.5 pg (ref 26.0–34.0)
MCHC: 34.5 g/dL (ref 32.0–36.0)
MCV: 94.3 fL (ref 80.0–100.0)
MONOS PCT: 6 %
Monocytes Absolute: 0.2 10*3/uL (ref 0.2–0.9)
Neutro Abs: 1.4 10*3/uL (ref 1.4–6.5)
Neutrophils Relative %: 40 %
Platelets: 316 10*3/uL (ref 150–440)
RBC: 3.62 MIL/uL — ABNORMAL LOW (ref 3.80–5.20)
RDW: 14.5 % (ref 11.5–14.5)
WBC: 3.6 10*3/uL (ref 3.6–11.0)

## 2017-10-04 MED ORDER — FULVESTRANT 250 MG/5ML IM SOLN
500.0000 mg | Freq: Once | INTRAMUSCULAR | Status: AC
  Administered 2017-10-04: 500 mg via INTRAMUSCULAR

## 2017-10-04 NOTE — Assessment & Plan Note (Addendum)
STAGE IV.-Recurrent/metastatic ER positive/PR negative HER-2/neu-? breast cancer [biopsy-proven-mediastinal lymph node; in suff for her 2 tetsing].feb 2019- CT scan shows PR of mediastinal LN; however tumor marker stable ~50s.   # Patient currently on Faslodex; on Abema- 100 BID tolerating  Better; Continue 100 mg BID abema; clinically no evidence of progression.    # Extreme Fatigue-thyroid functions slightly abnormal TSH 4.7; on synthroid 25 mcg/day.  Overall improved.  Suspect from abema more than hypothyroidism.  Will check TSH again at next visit  # Poorly controlled Blood glucose- improved; FBS- 155.   # CKD-III; creatinine 1.2. Stable.  #  [cbc/cmp/ca-27-29; Thyroid profile] shots/ follow up with me in 4 weeks.

## 2017-10-04 NOTE — Progress Notes (Signed)
[place] OFFICE PROGRESS NOTE  Patient Care Team: Karen Kitchens, MD as PCP - General (Family Medicine) Neldon Mc, MD (General Surgery) Everlene Farrier, MD (Obstetrics and Gynecology) Noreene Filbert, MD Forest Gleason, MD (Unknown Physician Specialty)  Cancer Staging Carcinoma of lower-outer quadrant of right breast in female, estrogen receptor positive [facility]) Staging form: Breast, AJCC 7th Edition - Pathologic: ypT1c,ypN2a, MX - Signed by Haywood Lasso, MD on 03/10/2012    Oncology History   # DEC 2012- RIGHT BREAST CA T2 N2 M0 tumor from biopsy.  Estrogen receptor positive, Progesterone receptor positive.  Current receptor negative by FISH 2. Neoadjuvant chemotherapy started in December of 28 with Cytoxan Adriamycin 3. Started on Taxol weekly chemotherapy. 4. Patient finished 12  cycles of Taxol chemotherapy in May of 2013.     5. Status post right modified radical mastectomy [Dr.Bowers; [place]] June of 2013, ypT1c  yp N2  MO. started also on Lerazole    7. Radiation therapy to the right breast (September of 2013).  Lymph node was positive for HER-2 receptor gene amplification of 2.22.  Will proceed with Herceptin treatment starting in September of 2013.   8.Patient has finished Herceptin (maintenance therapy) in August of 2014 8. Start patient on letrozole from November, 2013. 9. Patient started on Herceptin in September 2013.   10Patient finished 12 months of Herceptin therapy on August, 2014  # 6. Status post left side prophylactic mastectomy.  #Late MAY 2018-RECURRENCE BREAST CA- ER POS; PR-NEG; Her-Pending  [elevated Tumor marker- CT/PET- uptake in Right Mediastinal LN; Sternum [June 2018 EBUS- Dr.Kasa]   # March 10 2017- faslodex + Abema; OCT 5th CT-PR of mediastinal LN     Carcinoma of lower-outer quadrant of right breast in female, estrogen receptor positive ([place])    INTERVAL HISTORY:  Kimberly [name] 62 y.o.  female pleasant  patient above history of stage IV breast cancer ER/PR positive ? HER-2/neu- is here for follow-up. Patient is currently on Faslodex plus Abemaciclib [since June 2018]- is here for follow-up/review the results of the restaging CAT scan.  Patient denies any nausea vomiting or diarrhea.  Appetite is good.  No fevers or chills.  No cough.  She continues with Synthroid.  Denies any headaches or vision changes.  Patient continues to complain of fatigue; mild but overall improved.  REVIEW OF SYSTEMS:  A complete 10 point review of system is done which is negative except mentioned above/history of present illness.   PAST MEDICAL HISTORY :  Past Medical History:  Diagnosis Date  . Arthritis   . Cancer of lower-outer quadrant of female breast ([place]) 08/06/2011   RIGHT   . Diabetes mellitus ([place]) 03/24/2012  . High cholesterol   . History of kidney stones   . Hx of bilateral breast implants   . Hypertension   . PONV (postoperative nausea and vomiting)   . Type II diabetes mellitus ([facility])    fasting 140-150  . Vertigo    LAST WEEK  . Vertigo 01/2017    PAST SURGICAL HISTORY :   Past Surgical History:  Procedure Laterality Date  . BREAST BIOPSY  07/2011   right  . BREAST RECONSTRUCTION  03/07/2012   Procedure: BREAST RECONSTRUCTION;  Surgeon: Crissie Reese, MD;  Location: [place];  Service: Plastics;  Laterality: Left;  BREAST RECONSTRUCTION WITH PLACEMENT OF TISSUE EXPANDER TO LEFT BREAST  . BREAST RECONSTRUCTION  02/06/2013  . CESAREAN SECTION  [id number]  . ENDOBRONCHIAL ULTRASOUND N/A 02/24/2017  Procedure: ENDOBRONCHIAL ULTRASOUND;  Surgeon: [name], MD;  Location: [facility];  Service: Cardiopulmonary;  Laterality: N/A;  . LATISSIMUS FLAP TO BREAST Right 02/06/2013   Procedure: LATISSIMUS FLAP TO RIGHT BREAST WITH IMPLANT;  Surgeon: Crissie Reese, MD;  Location: [place];  Service: Plastics;  Laterality: Right;  . MASTECTOMY  03/07/12   modified right; total left  . MODIFIED MASTECTOMY  03/07/2012    Procedure: MODIFIED MASTECTOMY;  Surgeon: Haywood Lasso, MD;  Location: [place];  Service: General;  Laterality: Right;  . PORTACATH PLACEMENT  07/2011  . SCAR REVISION  03/30/2012   Procedure: SCAR REVISION;  Surgeon: Haywood Lasso, MD;  Location: [place];  Service: General;  Laterality: Right;  CLOSURE OF MASTECTOMY INCISION  . WISDOM TOOTH EXTRACTION      FAMILY HISTORY :   Family History  Problem Relation Age of Onset  . Cancer Mother        breast    SOCIAL HISTORY:   Social History   Tobacco Use  . Smoking status: Never Smoker  . Smokeless tobacco: Never Used  Substance Use Topics  . Alcohol use: No  . Drug use: No    ALLERGIES:  is allergic to sulfa antibiotics.  MEDICATIONS:  Current Outpatient Medications  Medication Sig Dispense Refill  . abemaciclib (VERZENIO) 100 MG tablet Take 1 tablet (100 mg total) by mouth 2 (two) times daily. 56 tablet 4  . atorvastatin (LIPITOR) 20 MG tablet Take 20 mg by mouth every morning.    . Calcium Carbonate-Vitamin D (CALCIUM 600 + D PO) Take 1 tablet by mouth 2 (two) times daily.    . fulvestrant (FASLODEX) 250 MG/5ML injection Inject into the muscle every 30 (thirty) days. One injection each buttock over 1-2 minutes. Warm prior to use.    Marland Kitchen glipiZIDE (GLUCOTROL) 10 MG tablet Take 10 mg by mouth daily before breakfast.    . levothyroxine (SYNTHROID) 25 MCG tablet Take 1 tablet (25 mcg total) by mouth daily before breakfast. 30 tablet 3  . lisinopril (PRINIVIL,ZESTRIL) 10 MG tablet Take 10 mg by mouth every morning.     . loperamide (IMODIUM A-D) 2 MG tablet Take 2 mg by mouth 4 (four) times daily as needed for diarrhea or loose stools.    . ondansetron (ZOFRAN) 8 MG tablet Take 1 tablet (8 mg total) every 8 (eight) hours as needed by mouth for nausea or vomiting. 20 tablet 3  . pantoprazole (PROTONIX) 40 MG tablet TAKE 1 TABLET (40 MG TOTAL) BY MOUTH DAILY. 30 MINS PRIOR TO MEALS ON EMPTY STOMACH 30 tablet 0   . prochlorperazine (COMPAZINE) 10 MG tablet Take 1 tablet (10 mg total) by mouth every 6 (six) hours as needed for nausea or vomiting. 30 tablet 0  . Semaglutide (OZEMPIC) 0.25 or 0.5 MG/DOSE SOPN Inject 0.25 mg into the skin once a week.     No current facility-administered medications for this visit.     PHYSICAL EXAMINATION: ECOG PERFORMANCE STATUS: 0 - Asymptomatic  BP 126/84 (BP Location: Left Arm, Patient Position: Sitting)   Pulse (!) 16   Temp (!) 97.4 F (36.3 C) (Tympanic)   Resp 16   Wt 182 lb (82.6 kg)   BMI 33.29 kg/m   Filed Weights   10/04/17 0850  Weight: 182 lb (82.6 kg)    GENERAL: Well-nourished well-developed; Alert, no distress and comfortable.  She is alone. EYES: no pallor or icterus OROPHARYNX: no thrush or ulceration; good dentition  NECK: supple, no masses felt LYMPH:  no palpable lymphadenopathy in the cervical, axillary or inguinal regions LUNGS: clear to auscultation and  No wheeze or crackles HEART/CVS: regular rate & rhythm and no murmurs; No lower extremity edema ABDOMEN:abdomen soft, Nontender and nondistended. Bowel sounds present.  Musculoskeletal:no cyanosis of digits and no clubbing  PSYCH: alert & oriented x 3 with fluent speech NEURO: no focal motor/sensory deficits SKIN:  no rashes or significant lesions;  LABORATORY DATA:  I have reviewed the data as listed    Component Value Date/Time   NA 132 (L) 10/04/2017 0806   NA 135 11/28/2014 1401   K 4.5 10/04/2017 0806   K 4.7 11/28/2014 1401   CL 100 (L) 10/04/2017 0806   CL 99 (L) 11/28/2014 1401   CO2 22 10/04/2017 0806   CO2 28 11/28/2014 1401   GLUCOSE 151 (H) 10/04/2017 0806   GLUCOSE 215 (H) 11/28/2014 1401   BUN 15 10/04/2017 0806   BUN 16 11/28/2014 1401   CREATININE 1.06 (H) 10/04/2017 0806   CREATININE 0.96 11/28/2014 1401   CALCIUM 9.5 10/04/2017 0806   CALCIUM 9.9 11/28/2014 1401   PROT 7.7 10/04/2017 0806   PROT 7.6 11/28/2014 1401   ALBUMIN 3.9 10/04/2017 0806    ALBUMIN 4.5 11/28/2014 1401   AST 41 10/04/2017 0806   AST 26 11/28/2014 1401   ALT 30 10/04/2017 0806   ALT 28 11/28/2014 1401   ALKPHOS 53 10/04/2017 0806   ALKPHOS 78 11/28/2014 1401   BILITOT 0.5 10/04/2017 0806   BILITOT 0.5 11/28/2014 1401   GFRNONAA 55 (L) 10/04/2017 0806   GFRNONAA >60 11/28/2014 1401   GFRAA >60 10/04/2017 0806   GFRAA >60 11/28/2014 1401    No results found for: SPEP, UPEP  Lab Results  Component Value Date   WBC 3.6 10/04/2017   NEUTROABS 1.4 10/04/2017   HGB 11.8 (L) 10/04/2017   HCT 34.1 (L) 10/04/2017   MCV 94.3 10/04/2017   PLT 316 10/04/2017      Chemistry      Component Value Date/Time   NA 132 (L) 10/04/2017 0806   NA 135 11/28/2014 1401   K 4.5 10/04/2017 0806   K 4.7 11/28/2014 1401   CL 100 (L) 10/04/2017 0806   CL 99 (L) 11/28/2014 1401   CO2 22 10/04/2017 0806   CO2 28 11/28/2014 1401   BUN 15 10/04/2017 0806   BUN 16 11/28/2014 1401   CREATININE 1.06 (H) 10/04/2017 0806   CREATININE 0.96 11/28/2014 1401      Component Value Date/Time   CALCIUM 9.5 10/04/2017 0806   CALCIUM 9.9 11/28/2014 1401   ALKPHOS 53 10/04/2017 0806   ALKPHOS 78 11/28/2014 1401   AST 41 10/04/2017 0806   AST 26 11/28/2014 1401   ALT 30 10/04/2017 0806   ALT 28 11/28/2014 1401   BILITOT 0.5 10/04/2017 0806   BILITOT 0.5 11/28/2014 1401     Results for TIAH, HECKEL (MRN [id number]) as of 10/04/2017 08:53  Ref. Range 01/04/2017 08:22 01/19/2017 13:18 03/08/2017 10:49 05/10/2017 08:07 06/07/2017 08:19 07/05/2017 08:20 08/02/2017 08:16 09/06/2017 09:02  CA 27.29 Latest Ref Range: 0.0 - 38.6 U/mL 46.2 (H) 52.1 (H)        CA 27.29 Latest Ref Range: 0.0 - 38.6 U/mL   47.3 (H) 56.9 (H) 56.4 (H) 56.1 (H) 53.0 (H) 55.0 (H)    RADIOGRAPHIC STUDIES: I have personally reviewed the radiological images as listed and agreed with the findings in the report.  No results found.   ASSESSMENT & PLAN:  Carcinoma of lower-outer quadrant of right breast in female,  estrogen receptor positive ([place]) STAGE IV.-Recurrent/metastatic ER positive/PR negative HER-2/neu-? breast cancer [biopsy-proven-mediastinal lymph node; in suff for her 2 tetsing].feb 2019- CT scan shows PR of mediastinal LN; however tumor marker stable ~50s.   # Patient currently on Faslodex; on Abema- 100 BID tolerating  Better; Continue 100 mg BID abema; clinically no evidence of progression.    # Extreme Fatigue-thyroid functions slightly abnormal TSH 4.7; on synthroid 25 mcg/day.  Overall improved.  Suspect from abema more than hypothyroidism.  Will check TSH again at next visit  # Poorly controlled Blood glucose- improved; FBS- 155.   # CKD-III; creatinine 1.2. Stable.  #  [cbc/cmp/ca-27-29; Thyroid profile] shots/ follow up with me in 4 weeks.   Orders Placed This Encounter  Procedures  . CBC with Differential/Platelet    Standing Status:   Future    Standing Expiration Date:   10/04/2018  . Comprehensive metabolic panel    Standing Status:   Future    Standing Expiration Date:   10/04/2018  . Cancer antigen 27.29    Standing Status:   Future    Standing Expiration Date:   10/04/2018  . Thyroid Panel With TSH    Standing Status:   Future    Standing Expiration Date:   04/03/2018   All questions were answered. The patient knows to call the clinic with any problems, questions or concerns.      [name], MD 10/04/2017 10:07 AM

## 2017-10-05 LAB — CANCER ANTIGEN 27.29: CA 27.29: 42.3 U/mL — ABNORMAL HIGH (ref 0.0–38.6)

## 2017-10-14 MED FILL — VERZENIO 100 MG TAB: 100 | 28 days supply | Qty: 56 | Fill #3

## 2017-10-20 ENCOUNTER — Telehealth: Payer: Self-pay | Admitting: Pharmacist

## 2017-10-20 NOTE — Telephone Encounter (Signed)
Oral Chemotherapy Pharmacist Encounter  Follow-Up Form  Called patient today to follow up regarding patient's oral chemotherapy medication: Verzenio (abemaciclib)  Original Start date of oral chemotherapy: 02/2017  Pt reports 0 tablets/doses of Verzenio missed so far this month.   Pt reports the following side effects: none reported  Recent labs reviewed: CA 27.29 and CBC from 10/04/17  New medications?: Synthroid, no DDI with Verzenio  Other Issues: none reported  Patient knows to call the office with questions or concerns. Oral Oncology Clinic will continue to follow.  Darl Pikes, PharmD, BCPS Hematology/Oncology Clinical Pharmacist [facility] [phone]  10/20/2017 3:59 PM

## 2017-11-01 ENCOUNTER — Other Ambulatory Visit: Payer: Self-pay

## 2017-11-01 ENCOUNTER — Inpatient Hospital Stay: Payer: BLUE CROSS/BLUE SHIELD | Attending: Internal Medicine | Admitting: Internal Medicine

## 2017-11-01 ENCOUNTER — Inpatient Hospital Stay: Payer: BLUE CROSS/BLUE SHIELD

## 2017-11-01 VITALS — BP 130/80 | HR 100 | Temp 96.4°F | Resp 18 | Wt 184.5 lb

## 2017-11-01 DIAGNOSIS — Z170 Estrogen receptor positive status [ER+]: Principal | ICD-10-CM

## 2017-11-01 DIAGNOSIS — Z5111 Encounter for antineoplastic chemotherapy: Secondary | ICD-10-CM | POA: Diagnosis present

## 2017-11-01 DIAGNOSIS — C50511 Malignant neoplasm of lower-outer quadrant of right female breast: Secondary | ICD-10-CM

## 2017-11-01 DIAGNOSIS — E1122 Type 2 diabetes mellitus with diabetic chronic kidney disease: Secondary | ICD-10-CM

## 2017-11-01 DIAGNOSIS — N183 Chronic kidney disease, stage 3 (moderate): Secondary | ICD-10-CM | POA: Diagnosis not present

## 2017-11-01 DIAGNOSIS — R5383 Other fatigue: Secondary | ICD-10-CM

## 2017-11-01 LAB — CBC WITH DIFFERENTIAL/PLATELET
BASOS PCT: 2 %
Basophils Absolute: 0.1 10*3/uL (ref 0–0.1)
Eosinophils Absolute: 0.2 10*3/uL (ref 0–0.7)
Eosinophils Relative: 5 %
HEMATOCRIT: 34.2 % — AB (ref 35.0–47.0)
Hemoglobin: 11.9 g/dL — ABNORMAL LOW (ref 12.0–16.0)
LYMPHS ABS: 1.8 10*3/uL (ref 1.0–3.6)
Lymphocytes Relative: 41 %
MCH: 32.9 pg (ref 26.0–34.0)
MCHC: 34.8 g/dL (ref 32.0–36.0)
MCV: 94.6 fL (ref 80.0–100.0)
MONO ABS: 0.2 10*3/uL (ref 0.2–0.9)
MONOS PCT: 5 %
NEUTROS ABS: 2.1 10*3/uL (ref 1.4–6.5)
Neutrophils Relative %: 47 %
Platelets: 312 10*3/uL (ref 150–440)
RBC: 3.61 MIL/uL — ABNORMAL LOW (ref 3.80–5.20)
RDW: 15.2 % — AB (ref 11.5–14.5)
WBC: 4.3 10*3/uL (ref 3.6–11.0)

## 2017-11-01 LAB — COMPREHENSIVE METABOLIC PANEL
ALT: 30 U/L (ref 14–54)
AST: 48 U/L — AB (ref 15–41)
Albumin: 3.9 g/dL (ref 3.5–5.0)
Alkaline Phosphatase: 46 U/L (ref 38–126)
Anion gap: 12 (ref 5–15)
BUN: 17 mg/dL (ref 6–20)
CHLORIDE: 98 mmol/L — AB (ref 101–111)
CO2: 24 mmol/L (ref 22–32)
CREATININE: 1.13 mg/dL — AB (ref 0.44–1.00)
Calcium: 9.5 mg/dL (ref 8.9–10.3)
GFR calc Af Amer: 59 mL/min — ABNORMAL LOW (ref >60)
GFR calc non Af Amer: 51 mL/min — ABNORMAL LOW (ref >60)
Glucose, Bld: 181 mg/dL — ABNORMAL HIGH (ref 65–99)
POTASSIUM: 4 mmol/L (ref 3.5–5.1)
SODIUM: 134 mmol/L — AB (ref 135–145)
Total Bilirubin: 0.5 mg/dL (ref 0.3–1.2)
Total Protein: 7.8 g/dL (ref 6.5–8.1)

## 2017-11-01 MED ORDER — FULVESTRANT 250 MG/5ML IM SOLN
500.0000 mg | Freq: Once | INTRAMUSCULAR | Status: AC
  Administered 2017-11-01: 500 mg via INTRAMUSCULAR

## 2017-11-01 NOTE — Progress Notes (Signed)
Here for follow up. Feeling good she stated w episodes of feeling tired.

## 2017-11-01 NOTE — Assessment & Plan Note (Addendum)
STAGE IV.-Recurrent/metastatic ER positive/PR negative HER-2/neu-? breast cancer [biopsy-proven-mediastinal lymph node; in suff for her 2 tetsing].feb 2019- CT scan shows PR of mediastinal LN; however tumor marker stable ~50s.   # Patient currently on Faslodex; on Abema- 100 BID tolerating  Better; Continue 100 mg BID abema; clinically no evidence of progression.    # Extreme Fatigue-thyroid functions slightly abnormal TSH 4.7; on synthroid 25 mcg/day.  Overall improved.  Awaiting on thyroid profile today.   # Poorly controlled Blood glucose- improved; FBS- 155.   # CKD-III; creatinine 1.2. Stable.  #  [cbc/cmp/ca-27-29; Thyroid profile] shots/ follow up with me in 4 weeks; recheck Iron levels; will leave messg.

## 2017-11-01 NOTE — Patient Instructions (Signed)

## 2017-11-01 NOTE — Progress Notes (Signed)
[place] OFFICE PROGRESS NOTE  Patient Care Team: Karen Kitchens, MD as PCP - General (Family Medicine) Neldon Mc, MD (General Surgery) Everlene Farrier, MD (Obstetrics and Gynecology) Noreene Filbert, MD Forest Gleason, MD (Unknown Physician Specialty)  Cancer Staging Carcinoma of lower-outer quadrant of right breast in female, estrogen receptor positive [facility]) Staging form: Breast, AJCC 7th Edition - Pathologic: ypT1c,ypN2a, MX - Signed by Haywood Lasso, MD on 03/10/2012    Oncology History   # DEC 2012- RIGHT BREAST CA T2 N2 M0 tumor from biopsy.  Estrogen receptor positive, Progesterone receptor positive.  Current receptor negative by FISH 2. Neoadjuvant chemotherapy started in December of 28 with Cytoxan Adriamycin 3. Started on Taxol weekly chemotherapy. 4. Patient finished 12  cycles of Taxol chemotherapy in May of 2013.     5. Status post right modified radical mastectomy [Dr.Bowers; [place]] June of 2013, ypT1c  yp N2  MO. started also on Lerazole    7. Radiation therapy to the right breast (September of 2013).  Lymph node was positive for HER-2 receptor gene amplification of 2.22.  Will proceed with Herceptin treatment starting in September of 2013.   8.Patient has finished Herceptin (maintenance therapy) in August of 2014 8. Start patient on letrozole from November, 2013. 9. Patient started on Herceptin in September 2013.   10Patient finished 12 months of Herceptin therapy on August, 2014  # 6. Status post left side prophylactic mastectomy.  #Late MAY 2018-RECURRENCE BREAST CA- ER POS; PR-NEG; Her-Pending  [elevated Tumor marker- CT/PET- uptake in Right Mediastinal LN; Sternum [June 2018 EBUS- Dr.Kasa]   # March 10 2017- faslodex + Abema; OCT 5th CT-PR of mediastinal LN     Carcinoma of lower-outer quadrant of right breast in female, estrogen receptor positive ([place])    INTERVAL HISTORY:  Kimberly [name] 62 y.o.  female pleasant  patient above history of stage IV breast cancer ER/PR positive ? HER-2/neu- is here for follow-up. Patient is currently on Faslodex plus Abemaciclib [since June 2018]- is here for follow-up.  Denies any shortness of breath or cough.  No diarrhea.  No fevers or chills.  She continues to be on Synthroid. Denies any headaches or vision changes.    REVIEW OF SYSTEMS:  A complete 10 point review of system is done which is negative except mentioned above/history of present illness.   PAST MEDICAL HISTORY :  Past Medical History:  Diagnosis Date  . Arthritis   . Cancer of lower-outer quadrant of female breast ([place]) 08/06/2011   RIGHT   . Diabetes mellitus ([place]) 03/24/2012  . High cholesterol   . History of kidney stones   . Hx of bilateral breast implants   . Hypertension   . PONV (postoperative nausea and vomiting)   . Type II diabetes mellitus ([facility])    fasting 140-150  . Vertigo    LAST WEEK  . Vertigo 01/2017    PAST SURGICAL HISTORY :   Past Surgical History:  Procedure Laterality Date  . BREAST BIOPSY  07/2011   right  . BREAST RECONSTRUCTION  03/07/2012   Procedure: BREAST RECONSTRUCTION;  Surgeon: Crissie Reese, MD;  Location: [place];  Service: Plastics;  Laterality: Left;  BREAST RECONSTRUCTION WITH PLACEMENT OF TISSUE EXPANDER TO LEFT BREAST  . BREAST RECONSTRUCTION  02/06/2013  . CESAREAN SECTION  [id number]  . ENDOBRONCHIAL ULTRASOUND N/A 02/24/2017   Procedure: ENDOBRONCHIAL ULTRASOUND;  Surgeon: [name], MD;  Location: [facility];  Service: Cardiopulmonary;  Laterality: N/A;  .  LATISSIMUS FLAP TO BREAST Right 02/06/2013   Procedure: LATISSIMUS FLAP TO RIGHT BREAST WITH IMPLANT;  Surgeon: Crissie Reese, MD;  Location: [place];  Service: Plastics;  Laterality: Right;  . MASTECTOMY  03/07/12   modified right; total left  . MODIFIED MASTECTOMY  03/07/2012   Procedure: MODIFIED MASTECTOMY;  Surgeon: Haywood Lasso, MD;  Location: [place];  Service: General;  Laterality: Right;  .  PORTACATH PLACEMENT  07/2011  . SCAR REVISION  03/30/2012   Procedure: SCAR REVISION;  Surgeon: Haywood Lasso, MD;  Location: [place];  Service: General;  Laterality: Right;  CLOSURE OF MASTECTOMY INCISION  . WISDOM TOOTH EXTRACTION      FAMILY HISTORY :   Family History  Problem Relation Age of Onset  . Cancer Mother        breast    SOCIAL HISTORY:   Social History   Tobacco Use  . Smoking status: Never Smoker  . Smokeless tobacco: Never Used  Substance Use Topics  . Alcohol use: No  . Drug use: No    ALLERGIES:  is allergic to sulfa antibiotics.  MEDICATIONS:  Current Outpatient Medications  Medication Sig Dispense Refill  . abemaciclib (VERZENIO) 100 MG tablet Take 1 tablet (100 mg total) by mouth 2 (two) times daily. 56 tablet 4  . atorvastatin (LIPITOR) 20 MG tablet Take 20 mg by mouth every morning.    . Calcium Carbonate-Vitamin D (CALCIUM 600 + D PO) Take 1 tablet by mouth 2 (two) times daily.    . fulvestrant (FASLODEX) 250 MG/5ML injection Inject into the muscle every 30 (thirty) days. One injection each buttock over 1-2 minutes. Warm prior to use.    Marland Kitchen glipiZIDE (GLUCOTROL) 10 MG tablet Take 10 mg by mouth daily before breakfast.    . levothyroxine (SYNTHROID) 25 MCG tablet Take 1 tablet (25 mcg total) by mouth daily before breakfast. 30 tablet 3  . lisinopril (PRINIVIL,ZESTRIL) 10 MG tablet Take 10 mg by mouth every morning.     . Semaglutide (OZEMPIC) 0.25 or 0.5 MG/DOSE SOPN Inject 0.25 mg into the skin once a week.    . loperamide (IMODIUM A-D) 2 MG tablet Take 2 mg by mouth 4 (four) times daily as needed for diarrhea or loose stools.    . ondansetron (ZOFRAN) 8 MG tablet Take 1 tablet (8 mg total) every 8 (eight) hours as needed by mouth for nausea or vomiting. (Patient not taking: Reported on 11/01/2017) 20 tablet 3  . pantoprazole (PROTONIX) 40 MG tablet TAKE 1 TABLET (40 MG TOTAL) BY MOUTH DAILY. [place] ON EMPTY STOMACH  (Patient not taking: Reported on 11/01/2017) 30 tablet 0  . prochlorperazine (COMPAZINE) 10 MG tablet Take 1 tablet (10 mg total) by mouth every 6 (six) hours as needed for nausea or vomiting. (Patient not taking: Reported on 11/01/2017) 30 tablet 0   No current facility-administered medications for this visit.     PHYSICAL EXAMINATION: ECOG PERFORMANCE STATUS: 0 - Asymptomatic  BP 130/80 (BP Location: Left Arm, Patient Position: Sitting)   Pulse 100 Comment: manually   Temp (!) 96.4 F (35.8 C) (Tympanic)   Resp 18   Wt 184 lb 8.4 oz (83.7 kg)   BMI 33.75 kg/m   Filed Weights   11/01/17 1128  Weight: 184 lb 8.4 oz (83.7 kg)    GENERAL: Well-nourished well-developed; Alert, no distress and comfortable.  She is accompanied by her husband. EYES: no pallor or icterus OROPHARYNX: no  thrush or ulceration; good dentition  NECK: supple, no masses felt LYMPH:  no palpable lymphadenopathy in the cervical, axillary or inguinal regions LUNGS: clear to auscultation and  No wheeze or crackles HEART/CVS: regular rate & rhythm and no murmurs; No lower extremity edema ABDOMEN:abdomen soft, Nontender and nondistended. Bowel sounds present.  Musculoskeletal:no cyanosis of digits and no clubbing  PSYCH: alert & oriented x 3 with fluent speech NEURO: no focal motor/sensory deficits SKIN:  no rashes or significant lesions;  LABORATORY DATA:  I have reviewed the data as listed    Component Value Date/Time   NA 134 (L) 11/01/2017 1027   NA 135 11/28/2014 1401   K 4.0 11/01/2017 1027   K 4.7 11/28/2014 1401   CL 98 (L) 11/01/2017 1027   CL 99 (L) 11/28/2014 1401   CO2 24 11/01/2017 1027   CO2 28 11/28/2014 1401   GLUCOSE 181 (H) 11/01/2017 1027   GLUCOSE 215 (H) 11/28/2014 1401   BUN 17 11/01/2017 1027   BUN 16 11/28/2014 1401   CREATININE 1.13 (H) 11/01/2017 1027   CREATININE 0.96 11/28/2014 1401   CALCIUM 9.5 11/01/2017 1027   CALCIUM 9.9 11/28/2014 1401   PROT 7.8 11/01/2017 1027    PROT 7.6 11/28/2014 1401   ALBUMIN 3.9 11/01/2017 1027   ALBUMIN 4.5 11/28/2014 1401   AST 48 (H) 11/01/2017 1027   AST 26 11/28/2014 1401   ALT 30 11/01/2017 1027   ALT 28 11/28/2014 1401   ALKPHOS 46 11/01/2017 1027   ALKPHOS 78 11/28/2014 1401   BILITOT 0.5 11/01/2017 1027   BILITOT 0.5 11/28/2014 1401   GFRNONAA 51 (L) 11/01/2017 1027   GFRNONAA >60 11/28/2014 1401   GFRAA 59 (L) 11/01/2017 1027   GFRAA >60 11/28/2014 1401    No results found for: SPEP, UPEP  Lab Results  Component Value Date   WBC 4.3 11/01/2017   NEUTROABS 2.1 11/01/2017   HGB 11.9 (L) 11/01/2017   HCT 34.2 (L) 11/01/2017   MCV 94.6 11/01/2017   PLT 312 11/01/2017      Chemistry      Component Value Date/Time   NA 134 (L) 11/01/2017 1027   NA 135 11/28/2014 1401   K 4.0 11/01/2017 1027   K 4.7 11/28/2014 1401   CL 98 (L) 11/01/2017 1027   CL 99 (L) 11/28/2014 1401   CO2 24 11/01/2017 1027   CO2 28 11/28/2014 1401   BUN 17 11/01/2017 1027   BUN 16 11/28/2014 1401   CREATININE 1.13 (H) 11/01/2017 1027   CREATININE 0.96 11/28/2014 1401      Component Value Date/Time   CALCIUM 9.5 11/01/2017 1027   CALCIUM 9.9 11/28/2014 1401   ALKPHOS 46 11/01/2017 1027   ALKPHOS 78 11/28/2014 1401   AST 48 (H) 11/01/2017 1027   AST 26 11/28/2014 1401   ALT 30 11/01/2017 1027   ALT 28 11/28/2014 1401   BILITOT 0.5 11/01/2017 1027   BILITOT 0.5 11/28/2014 1401     Results for KATALIN, COLLEDGE (MRN [id number]) as of 10/04/2017 08:53  Ref. Range 01/04/2017 08:22 01/19/2017 13:18 03/08/2017 10:49 05/10/2017 08:07 06/07/2017 08:19 07/05/2017 08:20 08/02/2017 08:16 09/06/2017 09:02  CA 27.29 Latest Ref Range: 0.0 - 38.6 U/mL 46.2 (H) 52.1 (H)        CA 27.29 Latest Ref Range: 0.0 - 38.6 U/mL   47.3 (H) 56.9 (H) 56.4 (H) 56.1 (H) 53.0 (H) 55.0 (H)    RADIOGRAPHIC STUDIES: I have personally reviewed the radiological images as  listed and agreed with the findings in the report. No results found.   ASSESSMENT & PLAN:   Carcinoma of lower-outer quadrant of right breast in female, estrogen receptor positive ([place]) STAGE IV.-Recurrent/metastatic ER positive/PR negative HER-2/neu-? breast cancer [biopsy-proven-mediastinal lymph node; in suff for her 2 tetsing].feb 2019- CT scan shows PR of mediastinal LN; however tumor marker stable ~50s.   # Patient currently on Faslodex; on Abema- 100 BID tolerating  Better; Continue 100 mg BID abema; clinically no evidence of progression.    # Extreme Fatigue-thyroid functions slightly abnormal TSH 4.7; on synthroid 25 mcg/day.  Overall improved.  Awaiting on thyroid profile today.   # Poorly controlled Blood glucose- improved; FBS- 155.   # CKD-III; creatinine 1.2. Stable.  #  [cbc/cmp/ca-27-29; Thyroid profile] shots/ follow up with me in 4 weeks; recheck Iron levels; will leave messg.    Orders Placed This Encounter  Procedures  . CBC with Differential/Platelet    Standing Status:   Future    Standing Expiration Date:   11/02/2018  . Comprehensive metabolic panel    Standing Status:   Future    Standing Expiration Date:   11/02/2018  . Cancer antigen 27.29    Standing Status:   Future    Standing Expiration Date:   11/02/2018   All questions were answered. The patient knows to call the clinic with any problems, questions or concerns.      [name], MD 11/01/2017 1:18 PM

## 2017-11-02 LAB — THYROID PANEL WITH TSH
Free Thyroxine Index: 1.9 (ref 1.2–4.9)
T3 Uptake Ratio: 22 % — ABNORMAL LOW (ref 24–39)
T4, Total: 8.6 ug/dL (ref 4.5–12.0)
TSH: 3.82 u[IU]/mL (ref 0.450–4.500)

## 2017-11-02 LAB — CANCER ANTIGEN 27.29: CAN 27.29: 42.2 U/mL — AB (ref 0.0–38.6)

## 2017-11-04 ENCOUNTER — Other Ambulatory Visit: Payer: Self-pay | Admitting: *Deleted

## 2017-11-04 MED ORDER — LEVOTHYROXINE SODIUM 25 MCG PO TABS: 25.0000 ug | ORAL_TABLET | Freq: Every day | ORAL | 3 refills | Status: DC

## 2017-11-10 MED FILL — VERZENIO 100 MG TAB: 100 | 28 days supply | Qty: 56 | Fill #4

## 2017-11-29 ENCOUNTER — Inpatient Hospital Stay: Payer: BLUE CROSS/BLUE SHIELD

## 2017-11-29 ENCOUNTER — Other Ambulatory Visit: Payer: Self-pay

## 2017-11-29 ENCOUNTER — Inpatient Hospital Stay: Payer: BLUE CROSS/BLUE SHIELD | Attending: Internal Medicine | Admitting: Internal Medicine

## 2017-11-29 VITALS — BP 135/82 | HR 99 | Temp 96.5°F | Resp 18 | Wt 183.8 lb

## 2017-11-29 DIAGNOSIS — R5383 Other fatigue: Secondary | ICD-10-CM

## 2017-11-29 DIAGNOSIS — R194 Change in bowel habit: Secondary | ICD-10-CM

## 2017-11-29 DIAGNOSIS — C50511 Malignant neoplasm of lower-outer quadrant of right female breast: Secondary | ICD-10-CM

## 2017-11-29 DIAGNOSIS — Z170 Estrogen receptor positive status [ER+]: Secondary | ICD-10-CM | POA: Diagnosis not present

## 2017-11-29 DIAGNOSIS — N183 Chronic kidney disease, stage 3 (moderate): Secondary | ICD-10-CM | POA: Diagnosis not present

## 2017-11-29 DIAGNOSIS — Z5111 Encounter for antineoplastic chemotherapy: Secondary | ICD-10-CM | POA: Diagnosis present

## 2017-11-29 LAB — COMPREHENSIVE METABOLIC PANEL
ALBUMIN: 4 g/dL (ref 3.5–5.0)
ALK PHOS: 53 U/L (ref 38–126)
ALT: 27 U/L (ref 14–54)
ANION GAP: 6 (ref 5–15)
AST: 38 U/L (ref 15–41)
BILIRUBIN TOTAL: 0.6 mg/dL (ref 0.3–1.2)
BUN: 19 mg/dL (ref 6–20)
CO2: 22 mmol/L (ref 22–32)
CREATININE: 1.09 mg/dL — AB (ref 0.44–1.00)
Calcium: 9.7 mg/dL (ref 8.9–10.3)
Chloride: 103 mmol/L (ref 101–111)
GFR calc Af Amer: >60 mL/min (ref >60)
GFR calc non Af Amer: 53 mL/min — ABNORMAL LOW (ref >60)
GLUCOSE: 174 mg/dL — AB (ref 65–99)
Potassium: 4.6 mmol/L (ref 3.5–5.1)
Sodium: 131 mmol/L — ABNORMAL LOW (ref 135–145)
TOTAL PROTEIN: 7.5 g/dL (ref 6.5–8.1)

## 2017-11-29 LAB — CBC WITH DIFFERENTIAL/PLATELET
BASOS ABS: 0.1 10*3/uL (ref 0–0.1)
BASOS PCT: 3 %
EOS PCT: 3 %
Eosinophils Absolute: 0.1 10*3/uL (ref 0–0.7)
HCT: 34 % — ABNORMAL LOW (ref 35.0–47.0)
Hemoglobin: 11.8 g/dL — ABNORMAL LOW (ref 12.0–16.0)
Lymphocytes Relative: 46 %
Lymphs Abs: 1.6 10*3/uL (ref 1.0–3.6)
MCH: 33.7 pg (ref 26.0–34.0)
MCHC: 34.8 g/dL (ref 32.0–36.0)
MCV: 96.8 fL (ref 80.0–100.0)
MONO ABS: 0.2 10*3/uL (ref 0.2–0.9)
MONOS PCT: 6 %
Neutro Abs: 1.4 10*3/uL (ref 1.4–6.5)
Neutrophils Relative %: 42 %
PLATELETS: 331 10*3/uL (ref 150–440)
RBC: 3.52 MIL/uL — ABNORMAL LOW (ref 3.80–5.20)
RDW: 14.4 % (ref 11.5–14.5)
WBC: 3.4 10*3/uL — ABNORMAL LOW (ref 3.6–11.0)

## 2017-11-29 MED ORDER — FULVESTRANT 250 MG/5ML IM SOLN
500.0000 mg | Freq: Once | INTRAMUSCULAR | Status: AC
  Administered 2017-11-29: 500 mg via INTRAMUSCULAR

## 2017-11-29 NOTE — Progress Notes (Signed)
Here for follow up per orders pt stated she is " feeling good "

## 2017-11-29 NOTE — Progress Notes (Signed)
[place] OFFICE PROGRESS NOTE  Patient Care Team: Karen Kitchens, MD as PCP - General (Family Medicine) Neldon Mc, MD (General Surgery) Everlene Farrier, MD (Obstetrics and Gynecology) Noreene Filbert, MD Forest Gleason, MD (Unknown Physician Specialty)  Cancer Staging Carcinoma of lower-outer quadrant of right breast in female, estrogen receptor positive [facility]) Staging form: Breast, AJCC 7th Edition - Pathologic: ypT1c,ypN2a, MX - Signed by Haywood Lasso, MD on 03/10/2012    Oncology History   # DEC 2012- RIGHT BREAST CA T2 N2 M0 tumor from biopsy.  Estrogen receptor positive, Progesterone receptor positive.  Current receptor negative by FISH 2. Neoadjuvant chemotherapy started in December of 28 with Cytoxan Adriamycin 3. Started on Taxol weekly chemotherapy. 4. Patient finished 12  cycles of Taxol chemotherapy in May of 2013.     5. Status post right modified radical mastectomy [Dr.Bowers; [place]] June of 2013, ypT1c  yp N2  MO. started also on Lerazole    7. Radiation therapy to the right breast (September of 2013).  Lymph node was positive for HER-2 receptor gene amplification of 2.22.  Will proceed with Herceptin treatment starting in September of 2013.   8.Patient has finished Herceptin (maintenance therapy) in August of 2014 8. Start patient on letrozole from November, 2013. 9. Patient started on Herceptin in September 2013.   10Patient finished 12 months of Herceptin therapy on August, 2014  # 6. Status post left side prophylactic mastectomy.  #Late MAY 2018-RECURRENCE BREAST CA- ER POS; PR-NEG; Her-Pending  [elevated Tumor marker- CT/PET- uptake in Right Mediastinal LN; Sternum [June 2018 EBUS- Dr.Kasa]   # March 10 2017- faslodex + Abema; OCT 5th CT-PR of mediastinal LN     Carcinoma of lower-outer quadrant of right breast in female, estrogen receptor positive ([place])    INTERVAL HISTORY:  Kimberly [name] 62 y.o.  female pleasant  patient above history of stage IV breast cancer ER/PR positive ? HER-2/neu- is here for follow-up. Patient is currently on Faslodex plus Abemaciclib [since June 2018]- is here for follow-up.  Patient states that she was recently evaluated by her PCP-blood sugars have been doing well.  Mild loose stools otherwise no diarrhea.   Denies any shortness of breath or cough. No fevers or chills.  She continues to be on Synthroid. Denies any headaches or vision changes.    REVIEW OF SYSTEMS:  A complete 10 point review of system is done which is negative except mentioned above/history of present illness.   PAST MEDICAL HISTORY :  Past Medical History:  Diagnosis Date  . Arthritis   . Cancer of lower-outer quadrant of female breast ([place]) 08/06/2011   RIGHT   . Diabetes mellitus ([place]) 03/24/2012  . High cholesterol   . History of kidney stones   . Hx of bilateral breast implants   . Hypertension   . PONV (postoperative nausea and vomiting)   . Type II diabetes mellitus ([facility])    fasting 140-150  . Vertigo    LAST WEEK  . Vertigo 01/2017    PAST SURGICAL HISTORY :   Past Surgical History:  Procedure Laterality Date  . BREAST BIOPSY  07/2011   right  . BREAST RECONSTRUCTION  03/07/2012   Procedure: BREAST RECONSTRUCTION;  Surgeon: Crissie Reese, MD;  Location: [place];  Service: Plastics;  Laterality: Left;  BREAST RECONSTRUCTION WITH PLACEMENT OF TISSUE EXPANDER TO LEFT BREAST  . BREAST RECONSTRUCTION  02/06/2013  . CESAREAN SECTION  [id number]  . ENDOBRONCHIAL ULTRASOUND N/A 02/24/2017  Procedure: ENDOBRONCHIAL ULTRASOUND;  Surgeon: [name], MD;  Location: [facility];  Service: Cardiopulmonary;  Laterality: N/A;  . LATISSIMUS FLAP TO BREAST Right 02/06/2013   Procedure: LATISSIMUS FLAP TO RIGHT BREAST WITH IMPLANT;  Surgeon: Crissie Reese, MD;  Location: [place];  Service: Plastics;  Laterality: Right;  . MASTECTOMY  03/07/12   modified right; total left  . MODIFIED MASTECTOMY  03/07/2012    Procedure: MODIFIED MASTECTOMY;  Surgeon: Haywood Lasso, MD;  Location: [place];  Service: General;  Laterality: Right;  . PORTACATH PLACEMENT  07/2011  . SCAR REVISION  03/30/2012   Procedure: SCAR REVISION;  Surgeon: Haywood Lasso, MD;  Location: [place];  Service: General;  Laterality: Right;  CLOSURE OF MASTECTOMY INCISION  . WISDOM TOOTH EXTRACTION      FAMILY HISTORY :   Family History  Problem Relation Age of Onset  . Cancer Mother        breast    SOCIAL HISTORY:   Social History   Tobacco Use  . Smoking status: Never Smoker  . Smokeless tobacco: Never Used  Substance Use Topics  . Alcohol use: No  . Drug use: No    ALLERGIES:  is allergic to sulfa antibiotics.  MEDICATIONS:  Current Outpatient Medications  Medication Sig Dispense Refill  . abemaciclib (VERZENIO) 100 MG tablet Take 1 tablet (100 mg total) by mouth 2 (two) times daily. 56 tablet 4  . atorvastatin (LIPITOR) 20 MG tablet Take 20 mg by mouth every morning.    . Calcium Carbonate-Vitamin D (CALCIUM 600 + D PO) Take 1 tablet by mouth 2 (two) times daily.    . fulvestrant (FASLODEX) 250 MG/5ML injection Inject into the muscle every 30 (thirty) days. One injection each buttock over 1-2 minutes. Warm prior to use.    Marland Kitchen glipiZIDE (GLUCOTROL) 10 MG tablet Take 10 mg by mouth daily before breakfast.    . levothyroxine (SYNTHROID) 25 MCG tablet Take 1 tablet (25 mcg total) by mouth daily before breakfast. 30 tablet 3  . lisinopril (PRINIVIL,ZESTRIL) 10 MG tablet Take 10 mg by mouth every morning.     . Semaglutide (OZEMPIC) 0.25 or 0.5 MG/DOSE SOPN Inject 0.25 mg into the skin once a week.    . loperamide (IMODIUM A-D) 2 MG tablet Take 2 mg by mouth 4 (four) times daily as needed for diarrhea or loose stools.    . ondansetron (ZOFRAN) 8 MG tablet Take 1 tablet (8 mg total) every 8 (eight) hours as needed by mouth for nausea or vomiting. (Patient not taking: Reported on 11/01/2017) 20 tablet 3   . pantoprazole (PROTONIX) 40 MG tablet TAKE 1 TABLET (40 MG TOTAL) BY MOUTH DAILY. [place] ON EMPTY STOMACH (Patient not taking: Reported on 11/01/2017) 30 tablet 0  . prochlorperazine (COMPAZINE) 10 MG tablet Take 1 tablet (10 mg total) by mouth every 6 (six) hours as needed for nausea or vomiting. (Patient not taking: Reported on 11/01/2017) 30 tablet 0   No current facility-administered medications for this visit.     PHYSICAL EXAMINATION: ECOG PERFORMANCE STATUS: 0 - Asymptomatic  BP 135/82 (BP Location: Left Arm, Patient Position: Sitting)   Pulse 99   Temp (!) 96.5 F (35.8 C) (Tympanic)   Resp 18   Wt 183 lb 12.1 oz (83.3 kg)   BMI 33.61 kg/m   Filed Weights   11/29/17 0825  Weight: 183 lb 12.1 oz (83.3 kg)    GENERAL: Well-nourished well-developed; Alert,  no distress and comfortable.  She is accompanied by her husband. EYES: no pallor or icterus OROPHARYNX: no thrush or ulceration; good dentition  NECK: supple, no masses felt LYMPH:  no palpable lymphadenopathy in the cervical, axillary or inguinal regions LUNGS: clear to auscultation and  No wheeze or crackles HEART/CVS: regular rate & rhythm and no murmurs; No lower extremity edema ABDOMEN:abdomen soft, Nontender and nondistended. Bowel sounds present.  Musculoskeletal:no cyanosis of digits and no clubbing  PSYCH: alert & oriented x 3 with fluent speech NEURO: no focal motor/sensory deficits SKIN:  no rashes or significant lesions;  LABORATORY DATA:  I have reviewed the data as listed    Component Value Date/Time   NA 131 (L) 11/29/2017 0810   NA 135 11/28/2014 1401   K 4.6 11/29/2017 0810   K 4.7 11/28/2014 1401   CL 103 11/29/2017 0810   CL 99 (L) 11/28/2014 1401   CO2 22 11/29/2017 0810   CO2 28 11/28/2014 1401   GLUCOSE 174 (H) 11/29/2017 0810   GLUCOSE 215 (H) 11/28/2014 1401   BUN 19 11/29/2017 0810   BUN 16 11/28/2014 1401   CREATININE 1.09 (H) 11/29/2017 0810   CREATININE 0.96  11/28/2014 1401   CALCIUM 9.7 11/29/2017 0810   CALCIUM 9.9 11/28/2014 1401   PROT 7.5 11/29/2017 0810   PROT 7.6 11/28/2014 1401   ALBUMIN 4.0 11/29/2017 0810   ALBUMIN 4.5 11/28/2014 1401   AST 38 11/29/2017 0810   AST 26 11/28/2014 1401   ALT 27 11/29/2017 0810   ALT 28 11/28/2014 1401   ALKPHOS 53 11/29/2017 0810   ALKPHOS 78 11/28/2014 1401   BILITOT 0.6 11/29/2017 0810   BILITOT 0.5 11/28/2014 1401   GFRNONAA 53 (L) 11/29/2017 0810   GFRNONAA >60 11/28/2014 1401   GFRAA >60 11/29/2017 0810   GFRAA >60 11/28/2014 1401    No results found for: SPEP, UPEP  Lab Results  Component Value Date   WBC 3.4 (L) 11/29/2017   NEUTROABS 1.4 11/29/2017   HGB 11.8 (L) 11/29/2017   HCT 34.0 (L) 11/29/2017   MCV 96.8 11/29/2017   PLT 331 11/29/2017      Chemistry      Component Value Date/Time   NA 131 (L) 11/29/2017 0810   NA 135 11/28/2014 1401   K 4.6 11/29/2017 0810   K 4.7 11/28/2014 1401   CL 103 11/29/2017 0810   CL 99 (L) 11/28/2014 1401   CO2 22 11/29/2017 0810   CO2 28 11/28/2014 1401   BUN 19 11/29/2017 0810   BUN 16 11/28/2014 1401   CREATININE 1.09 (H) 11/29/2017 0810   CREATININE 0.96 11/28/2014 1401      Component Value Date/Time   CALCIUM 9.7 11/29/2017 0810   CALCIUM 9.9 11/28/2014 1401   ALKPHOS 53 11/29/2017 0810   ALKPHOS 78 11/28/2014 1401   AST 38 11/29/2017 0810   AST 26 11/28/2014 1401   ALT 27 11/29/2017 0810   ALT 28 11/28/2014 1401   BILITOT 0.6 11/29/2017 0810   BILITOT 0.5 11/28/2014 1401     Results for Kimberly [name], Kimberly [name] (MRN [id number]) as of 10/04/2017 08:53  Ref. Range 01/04/2017 08:22 01/19/2017 13:18 03/08/2017 10:49 05/10/2017 08:07 06/07/2017 08:19 07/05/2017 08:20 08/02/2017 08:16 09/06/2017 09:02  CA 27.29 Latest Ref Range: 0.0 - 38.6 U/mL 46.2 (H) 52.1 (H)        CA 27.29 Latest Ref Range: 0.0 - 38.6 U/mL   47.3 (H) 56.9 (H) 56.4 (H) 56.1 (H) 53.0 (H) 55.0 (  H)    RADIOGRAPHIC STUDIES: I have personally reviewed the radiological  images as listed and agreed with the findings in the report. No results found.   ASSESSMENT & PLAN:  Carcinoma of lower-outer quadrant of right breast in female, estrogen receptor positive ([place]) STAGE IV.-Recurrent/metastatic ER positive/PR negative HER-2/neu-? breast cancer [biopsy-proven-mediastinal lymph node; in suff for her 2 testing].feb 2019- CT scan shows PR of mediastinal LN; however tumor marker stable ~40-50s.  # Patient currently on Faslodex; on Abema- 100 BID tolerating  Better; Continue 100 mg BID abema; clinically no evidence of progression.  Will repeat scans in June [pt pref]  # Extreme Fatigue-thyroid functions slightly abnormal TSH 4.7; on synthroid 25 mcg/day.  Overall improved.   # Poorly controlled Blood glucose- improved; FBS- 155/ A1c- 7.0.   # CKD-III; creatinine 1.2. Stable.  # follow up in 5 weeks/labs- ca-27-29; faslodex.    No orders of the defined types were placed in this encounter.  All questions were answered. The patient knows to call the clinic with any problems, questions or concerns.      [name], MD 11/29/2017 10:27 AM

## 2017-11-29 NOTE — Assessment & Plan Note (Addendum)
STAGE IV.-Recurrent/metastatic ER positive/PR negative HER-2/neu-? breast cancer [biopsy-proven-mediastinal lymph node; in suff for her 2 testing].feb 2019- CT scan shows PR of mediastinal LN; however tumor marker stable ~40-50s.  # Patient currently on Faslodex; on Abema- 100 BID tolerating  Better; Continue 100 mg BID abema; clinically no evidence of progression.  Will repeat scans in June [pt pref]  # Extreme Fatigue-thyroid functions slightly abnormal TSH 4.7; on synthroid 25 mcg/day.  Overall improved.   # Poorly controlled Blood glucose- improved; FBS- 155/ A1c- 7.0.   # CKD-III; creatinine 1.2. Stable.  # follow up in 5 weeks [pt pref]/labs- ca-27-29; faslodex.

## 2017-11-30 LAB — CANCER ANTIGEN 27.29: CA 27.29: 46.1 U/mL — ABNORMAL HIGH (ref 0.0–38.6)

## 2017-12-01 ENCOUNTER — Other Ambulatory Visit: Payer: Self-pay | Admitting: Internal Medicine

## 2017-12-01 DIAGNOSIS — Z170 Estrogen receptor positive status [ER+]: Principal | ICD-10-CM

## 2017-12-01 DIAGNOSIS — C50511 Malignant neoplasm of lower-outer quadrant of right female breast: Secondary | ICD-10-CM

## 2017-12-05 MED FILL — VERZENIO 100 MG TAB: 100 | 28 days supply | Qty: 56 | Fill #0

## 2017-12-30 ENCOUNTER — Other Ambulatory Visit: Payer: Self-pay | Admitting: Internal Medicine

## 2017-12-30 DIAGNOSIS — C50511 Malignant neoplasm of lower-outer quadrant of right female breast: Secondary | ICD-10-CM

## 2017-12-30 DIAGNOSIS — Z170 Estrogen receptor positive status [ER+]: Principal | ICD-10-CM

## 2018-01-03 ENCOUNTER — Inpatient Hospital Stay: Payer: BLUE CROSS/BLUE SHIELD | Attending: Internal Medicine | Admitting: Internal Medicine

## 2018-01-03 ENCOUNTER — Encounter: Payer: Self-pay | Admitting: Internal Medicine

## 2018-01-03 ENCOUNTER — Inpatient Hospital Stay: Payer: BLUE CROSS/BLUE SHIELD

## 2018-01-03 DIAGNOSIS — C50511 Malignant neoplasm of lower-outer quadrant of right female breast: Secondary | ICD-10-CM | POA: Diagnosis not present

## 2018-01-03 DIAGNOSIS — Z170 Estrogen receptor positive status [ER+]: Secondary | ICD-10-CM

## 2018-01-03 DIAGNOSIS — Z5111 Encounter for antineoplastic chemotherapy: Secondary | ICD-10-CM | POA: Diagnosis not present

## 2018-01-03 DIAGNOSIS — N183 Chronic kidney disease, stage 3 (moderate): Secondary | ICD-10-CM | POA: Diagnosis not present

## 2018-01-03 LAB — COMPREHENSIVE METABOLIC PANEL
ALK PHOS: 42 U/L (ref 38–126)
ALT: 27 U/L (ref 14–54)
AST: 40 U/L (ref 15–41)
Albumin: 4 g/dL (ref 3.5–5.0)
Anion gap: 12 (ref 5–15)
BILIRUBIN TOTAL: 0.5 mg/dL (ref 0.3–1.2)
BUN: 17 mg/dL (ref 6–20)
CALCIUM: 10.2 mg/dL (ref 8.9–10.3)
CO2: 24 mmol/L (ref 22–32)
CREATININE: 1.25 mg/dL — AB (ref 0.44–1.00)
Chloride: 98 mmol/L — ABNORMAL LOW (ref 101–111)
GFR calc Af Amer: 52 mL/min — ABNORMAL LOW (ref >60)
GFR, EST NON AFRICAN AMERICAN: 45 mL/min — AB (ref >60)
Glucose, Bld: 148 mg/dL — ABNORMAL HIGH (ref 65–99)
Potassium: 4.3 mmol/L (ref 3.5–5.1)
Sodium: 134 mmol/L — ABNORMAL LOW (ref 135–145)
TOTAL PROTEIN: 7.5 g/dL (ref 6.5–8.1)

## 2018-01-03 LAB — CBC WITH DIFFERENTIAL/PLATELET
Basophils Absolute: 0.1 10*3/uL (ref 0–0.1)
Basophils Relative: 3 %
EOS PCT: 2 %
Eosinophils Absolute: 0.1 10*3/uL (ref 0–0.7)
HCT: 34.1 % — ABNORMAL LOW (ref 35.0–47.0)
Hemoglobin: 11.9 g/dL — ABNORMAL LOW (ref 12.0–16.0)
LYMPHS ABS: 1.4 10*3/uL (ref 1.0–3.6)
Lymphocytes Relative: 40 %
MCH: 34.1 pg — AB (ref 26.0–34.0)
MCHC: 34.9 g/dL (ref 32.0–36.0)
MCV: 97.6 fL (ref 80.0–100.0)
MONO ABS: 0.2 10*3/uL (ref 0.2–0.9)
MONOS PCT: 6 %
Neutro Abs: 1.7 10*3/uL (ref 1.4–6.5)
Neutrophils Relative %: 49 %
PLATELETS: 323 10*3/uL (ref 150–440)
RBC: 3.5 MIL/uL — ABNORMAL LOW (ref 3.80–5.20)
RDW: 13.9 % (ref 11.5–14.5)
WBC: 3.6 10*3/uL (ref 3.6–11.0)

## 2018-01-03 MED ORDER — FULVESTRANT 250 MG/5ML IM SOLN
500.0000 mg | Freq: Once | INTRAMUSCULAR | Status: AC
  Administered 2018-01-03: 500 mg via INTRAMUSCULAR

## 2018-01-03 NOTE — Assessment & Plan Note (Addendum)
#  Stage IV recurrent/metastatic ER positive PR negative question HER-2/neu-currently on Faslodex plus Abema.  Last imaging February 2019-CT PR; tumor marker stable between 40-50.  #No clinical evidence of progression; continue current therapy. Will repeat scans in June [pt pref]; order at next visit  # Hypothyroidism- on synthroid stable.   # Poorly controlled Blood glucose- improved; FBS- 148 today. .   # CKD-III; creatinine 1.2. Stable.  # follow up in 4 weeks [ ca-27-29; faslodex/MD. Will order CT scan at that visit.

## 2018-01-03 NOTE — Progress Notes (Signed)
[facility] OFFICE PROGRESS NOTE  Patient Care Team: Karen Kitchens, MD as PCP - General (Family Medicine) Neldon Mc, MD (General Surgery) Everlene Farrier, MD (Obstetrics and Gynecology) Noreene Filbert, MD Forest Gleason, MD (Unknown Physician Specialty)  Cancer Staging Carcinoma of lower-outer quadrant of right breast in female, estrogen receptor positive [facility]) Staging form: Breast, AJCC 7th Edition - Pathologic: ypT1c,ypN2a, MX - Signed by Haywood Lasso, MD on 03/10/2012    Oncology History   # DEC 2012- RIGHT BREAST CA T2 N2 M0 tumor from biopsy.  Estrogen receptor positive, Progesterone receptor positive.  Current receptor negative by FISH 2. Neoadjuvant chemotherapy started in December of 28 with Cytoxan Adriamycin 3. Started on Taxol weekly chemotherapy. 4. Patient finished 12  cycles of Taxol chemotherapy in May of 2013.     5. Status post right modified radical mastectomy [Dr.Bowers; [place]] June of 2013, ypT1c  yp N2  MO. started also on Lerazole    7. Radiation therapy to the right breast (September of 2013).  Lymph node was positive for HER-2 receptor gene amplification of 2.22.  Will proceed with Herceptin treatment starting in September of 2013.   8.Patient has finished Herceptin (maintenance therapy) in August of 2014 8. Start patient on letrozole from November, 2013. 9. Patient started on Herceptin in September 2013.   10Patient finished 12 months of Herceptin therapy on August, 2014  # 6. Status post left side prophylactic mastectomy.  #Late MAY 2018-RECURRENCE BREAST CA- ER positive/PR negative; ?? HER-2/neu- [biopsy- proven-mediastinal lymph node; in suff for her 2 testing].  [elevated Tumor marker- CT/PET- uptake in Right Mediastinal LN; Sternum [June 2018 [facility]- Dr.Kasa]   # March 10 2017- faslodex + Abema; OCT 5th CT-PR of mediastinal LN     Carcinoma of lower-outer quadrant of right breast in female, estrogen receptor positive  ([place])      INTERVAL HISTORY:  Kimberly [name] 62 y.o.  female pleasant patient above history of blood PR positive HER-2/neu positive breast cancer- [?  On repeat biopsy]-currently on Faslodex plus Verzinio-is here for follow-up.  Patient states her blood sugars are better.  She denies any swelling in the legs.  Denies any nausea vomiting.  Denies any shortness of breath or cough.  No fevers or chills.  Review of Systems  Constitutional: Negative for chills, diaphoresis, fever, malaise/fatigue and weight loss.  HENT: Negative for nosebleeds and sore throat.   Eyes: Negative for double vision.  Respiratory: Negative for cough, hemoptysis, sputum production, shortness of breath and wheezing.   Cardiovascular: Negative for chest pain, palpitations, orthopnea and leg swelling.  Gastrointestinal: Negative for abdominal pain, blood in stool, constipation, diarrhea, heartburn, melena, nausea and vomiting.  Genitourinary: Negative for dysuria, frequency and urgency.  Musculoskeletal: Negative for back pain and joint pain.  Skin: Negative.  Negative for itching and rash.  Neurological: Negative for dizziness, tingling, focal weakness, weakness and headaches.  Endo/Heme/Allergies: Does not bruise/bleed easily.  Psychiatric/Behavioral: Negative for depression. The patient is not nervous/anxious and does not have insomnia.       PAST MEDICAL HISTORY :  Past Medical History:  Diagnosis Date  . Arthritis   . Cancer of lower-outer quadrant of female breast ([place]) 08/06/2011   RIGHT   . Diabetes mellitus ([place]) 03/24/2012  . High cholesterol   . History of kidney stones   . Hx of bilateral breast implants   . Hypertension   . PONV (postoperative nausea and vomiting)   . Type II diabetes mellitus (  [facility])    fasting 140-150  . Vertigo    LAST WEEK  . Vertigo 01/2017    PAST SURGICAL HISTORY :   Past Surgical History:  Procedure Laterality Date  . BREAST BIOPSY  07/2011   right  . BREAST  RECONSTRUCTION  03/07/2012   Procedure: BREAST RECONSTRUCTION;  Surgeon: Crissie Reese, MD;  Location: [place];  Service: Plastics;  Laterality: Left;  BREAST RECONSTRUCTION WITH PLACEMENT OF TISSUE EXPANDER TO LEFT BREAST  . BREAST RECONSTRUCTION  02/06/2013  . CESAREAN SECTION  [id number]  . ENDOBRONCHIAL ULTRASOUND N/A 02/24/2017   Procedure: ENDOBRONCHIAL ULTRASOUND;  Surgeon: [name], MD;  Location: [facility];  Service: Cardiopulmonary;  Laterality: N/A;  . LATISSIMUS FLAP TO BREAST Right 02/06/2013   Procedure: LATISSIMUS FLAP TO RIGHT BREAST WITH IMPLANT;  Surgeon: Crissie Reese, MD;  Location: [facility];  Service: Plastics;  Laterality: Right;  . MASTECTOMY  03/07/12   modified right; total left  . MODIFIED MASTECTOMY  03/07/2012   Procedure: MODIFIED MASTECTOMY;  Surgeon: Haywood Lasso, MD;  Location: [place];  Service: General;  Laterality: Right;  . PORTACATH PLACEMENT  07/2011  . SCAR REVISION  03/30/2012   Procedure: SCAR REVISION;  Surgeon: Haywood Lasso, MD;  Location: [place];  Service: General;  Laterality: Right;  CLOSURE OF MASTECTOMY INCISION  . WISDOM TOOTH EXTRACTION      FAMILY HISTORY :   Family History  Problem Relation Age of Onset  . Cancer Mother        breast    SOCIAL HISTORY:   Social History   Tobacco Use  . Smoking status: Never Smoker  . Smokeless tobacco: Never Used  Substance Use Topics  . Alcohol use: No  . Drug use: No    ALLERGIES:  is allergic to sulfa antibiotics.  MEDICATIONS:  Current Outpatient Medications  Medication Sig Dispense Refill  . atorvastatin (LIPITOR) 20 MG tablet Take 20 mg by mouth every morning.    . Calcium Carbonate-Vitamin D (CALCIUM 600 + D PO) Take 1 tablet by mouth 2 (two) times daily.    . fulvestrant (FASLODEX) 250 MG/5ML injection Inject into the muscle every 30 (thirty) days. One injection each buttock over 1-2 minutes. Warm prior to use.    Marland Kitchen glipiZIDE (GLUCOTROL) 10 MG tablet Take 10 mg by  mouth daily before breakfast.    . levothyroxine (SYNTHROID) 25 MCG tablet Take 1 tablet (25 mcg total) by mouth daily before breakfast. 30 tablet 3  . lisinopril (PRINIVIL,ZESTRIL) 10 MG tablet Take 10 mg by mouth every morning.     . loperamide (IMODIUM A-D) 2 MG tablet Take 2 mg by mouth 4 (four) times daily as needed for diarrhea or loose stools.    . ondansetron (ZOFRAN) 8 MG tablet Take 1 tablet (8 mg total) every 8 (eight) hours as needed by mouth for nausea or vomiting. 20 tablet 3  . prochlorperazine (COMPAZINE) 10 MG tablet Take 1 tablet (10 mg total) by mouth every 6 (six) hours as needed for nausea or vomiting. 30 tablet 0  . Semaglutide (OZEMPIC) 0.25 or 0.5 MG/DOSE SOPN Inject 0.25 mg into the skin once a week.    Marland Kitchen VERZENIO 100 MG tablet TAKE 1 TABLET (100 MG TOTAL) BY MOUTH 2 (TWO) TIMES DAILY. 56 tablet 4  . pantoprazole (PROTONIX) 40 MG tablet TAKE 1 TABLET (40 MG TOTAL) BY MOUTH DAILY. [place] ON EMPTY STOMACH (Patient not taking: Reported on 11/01/2017) 30  tablet 0   No current facility-administered medications for this visit.     PHYSICAL EXAMINATION: ECOG PERFORMANCE STATUS: 0 - Asymptomatic  BP 118/79 (BP Location: Left Arm, Patient Position: Sitting)   Pulse 94   Temp 98.1 F (36.7 C) (Tympanic)   Resp (!) 94   Wt 183 lb 3.2 oz (83.1 kg)   BMI 33.51 kg/m   Filed Weights   01/03/18 0847  Weight: 183 lb 3.2 oz (83.1 kg)    GENERAL: Well-nourished well-developed; Alert, no distress and comfortable.  Alone.   EYES: no pallor or icterus OROPHARYNX: no thrush or ulceration; NECK: supple; no lymph nodes felt. LYMPH:  no palpable lymphadenopathy in the axillary or inguinal regions LUNGS: Decreased breath sounds auscultation bilaterally. No wheeze or crackles HEART/CVS: regular rate & rhythm and no murmurs; No lower extremity edema ABDOMEN:abdomen soft, non-tender and normal bowel sounds. No hepatomegaly or splenomegaly.  Musculoskeletal:no cyanosis  of digits and no clubbing  PSYCH: alert & oriented x 3 with fluent speech NEURO: no focal motor/sensory deficits SKIN:  no rashes or significant lesions    LABORATORY DATA:  I have reviewed the data as listed    Component Value Date/Time   NA 134 (L) 01/03/2018 0826   NA 135 11/28/2014 1401   K 4.3 01/03/2018 0826   K 4.7 11/28/2014 1401   CL 98 (L) 01/03/2018 0826   CL 99 (L) 11/28/2014 1401   CO2 24 01/03/2018 0826   CO2 28 11/28/2014 1401   GLUCOSE 148 (H) 01/03/2018 0826   GLUCOSE 215 (H) 11/28/2014 1401   BUN 17 01/03/2018 0826   BUN 16 11/28/2014 1401   CREATININE 1.25 (H) 01/03/2018 0826   CREATININE 0.96 11/28/2014 1401   CALCIUM 10.2 01/03/2018 0826   CALCIUM 9.9 11/28/2014 1401   PROT 7.5 01/03/2018 0826   PROT 7.6 11/28/2014 1401   ALBUMIN 4.0 01/03/2018 0826   ALBUMIN 4.5 11/28/2014 1401   AST 40 01/03/2018 0826   AST 26 11/28/2014 1401   ALT 27 01/03/2018 0826   ALT 28 11/28/2014 1401   ALKPHOS 42 01/03/2018 0826   ALKPHOS 78 11/28/2014 1401   BILITOT 0.5 01/03/2018 0826   BILITOT 0.5 11/28/2014 1401   GFRNONAA 45 (L) 01/03/2018 0826   GFRNONAA >60 11/28/2014 1401   GFRAA 52 (L) 01/03/2018 0826   GFRAA >60 11/28/2014 1401    No results found for: SPEP, UPEP  Lab Results  Component Value Date   WBC 3.6 01/03/2018   NEUTROABS 1.7 01/03/2018   HGB 11.9 (L) 01/03/2018   HCT 34.1 (L) 01/03/2018   MCV 97.6 01/03/2018   PLT 323 01/03/2018      Chemistry      Component Value Date/Time   NA 134 (L) 01/03/2018 0826   NA 135 11/28/2014 1401   K 4.3 01/03/2018 0826   K 4.7 11/28/2014 1401   CL 98 (L) 01/03/2018 0826   CL 99 (L) 11/28/2014 1401   CO2 24 01/03/2018 0826   CO2 28 11/28/2014 1401   BUN 17 01/03/2018 0826   BUN 16 11/28/2014 1401   CREATININE 1.25 (H) 01/03/2018 0826   CREATININE 0.96 11/28/2014 1401      Component Value Date/Time   CALCIUM 10.2 01/03/2018 0826   CALCIUM 9.9 11/28/2014 1401   ALKPHOS 42 01/03/2018 0826    ALKPHOS 78 11/28/2014 1401   AST 40 01/03/2018 0826   AST 26 11/28/2014 1401   ALT 27 01/03/2018 0826   ALT 28 11/28/2014 1401  BILITOT 0.5 01/03/2018 0826   BILITOT 0.5 11/28/2014 1401     Results for Kimberly [name], Kimberly [name] (MRN [id number]) as of 01/03/2018 09:27  Ref. Range 08/02/2017 08:16 09/06/2017 09:02 10/04/2017 08:06 11/01/2017 10:27 11/29/2017 08:10  CA 27.29 Latest Ref Range: 0.0 - 38.6 U/mL 53.0 (H) 55.0 (H) 42.3 (H) 42.2 (H) 46.1 (H)    RADIOGRAPHIC STUDIES: I have personally reviewed the radiological images as listed and agreed with the findings in the report. No results found.   ASSESSMENT & PLAN:  Carcinoma of lower-outer quadrant of right breast in female, estrogen receptor positive ([place]) #Stage IV recurrent/metastatic ER positive PR negative question HER-2/neu-currently on Faslodex plus Abema.  Last imaging February 2019-CT PR; tumor marker stable between 40-50.  #No clinical evidence of progression; continue current therapy. Will repeat scans in June [pt pref]; order at next visit  # Hypothyroidism- on synthroid stable.   # Poorly controlled Blood glucose- improved; FBS- 148 today. .   # CKD-III; creatinine 1.2. Stable.  # follow up in 4 weeks [ ca-27-29; faslodex/MD. Will order CT scan at that visit.    Orders Placed This Encounter  Procedures  . CBC with Differential/Platelet    Standing Status:   Future    Standing Expiration Date:   01/04/2019  . Comprehensive metabolic panel    Standing Status:   Future    Standing Expiration Date:   01/04/2019  . Cancer antigen 27.29    Standing Status:   Future    Standing Expiration Date:   01/04/2019   All questions were answered. The patient knows to call the clinic with any problems, questions or concerns.      [name], MD 01/03/2018 1:19 PM

## 2018-01-03 NOTE — Patient Instructions (Signed)

## 2018-01-04 ENCOUNTER — Telehealth: Payer: Self-pay | Admitting: *Deleted

## 2018-01-04 LAB — CANCER ANTIGEN 27.29: CAN 27.29: 37.1 U/mL (ref 0.0–38.6)

## 2018-01-04 MED FILL — VERZENIO 100 MG TAB: 100 | 28 days supply | Qty: 56 | Fill #1

## 2018-01-04 NOTE — Telephone Encounter (Signed)
-----   Message from [name], MD sent at 01/04/2018  1:07 PM EDT ----- Please inform patient that tumor marker is 37/normal/improved; follow-up as planned

## 2018-01-04 NOTE — Telephone Encounter (Signed)
Spoke with patient - results provided. Pt reassured tumor markers are now wnl. Will continue to monitor. She was instructed that we will f/u with her as planned.

## 2018-01-10 ENCOUNTER — Telehealth: Payer: Self-pay | Admitting: Pharmacist

## 2018-01-10 NOTE — Telephone Encounter (Signed)
Oral Chemotherapy Pharmacist Encounter   Attempted to reach patient for follow up on oral medication: Verzenio (abemaciclib). No answer. Left VM for patient to call back.    Darl Pikes, PharmD, BCPS Hematology/Oncology Clinical Pharmacist [facility] [phone]  01/10/2018 3:47 PM

## 2018-01-10 NOTE — Telephone Encounter (Signed)
Oral Chemotherapy Pharmacist Encounter  Follow-Up Form  Called patient today to follow up regarding patient's oral chemotherapy medication: Verzenio (abemaciclib)  Original Start date of oral chemotherapy: 02/2017  Pt reports 1 doses of Verzenio missed in the last month.  Missed dose(s) attributed to: simply forgetting Reviewed plan for missed doses.   Pt reports the following side effects: None reported  Recent labs reviewed: Ca 27.29 and CBC from 01/03/18  New medications?: None reported  Other Issues: None reported  Patient knows to call the office with questions or concerns. [facility]cology Clinic will continue to follow.  Darl Pikes, PharmD, BCPS Hematology/Oncology Clinical Pharmacist [facility] [phone]  01/10/2018 4:06 PM

## 2018-01-29 ENCOUNTER — Other Ambulatory Visit: Payer: Self-pay | Admitting: Internal Medicine

## 2018-01-30 ENCOUNTER — Other Ambulatory Visit: Payer: Self-pay | Admitting: *Deleted

## 2018-01-30 DIAGNOSIS — R7989 Other specified abnormal findings of blood chemistry: Secondary | ICD-10-CM

## 2018-01-30 NOTE — Telephone Encounter (Signed)
Will drawn tsh tomorrow in clinic. Will wait to obtain tsh results before refilling per md to determine dosage.

## 2018-01-30 NOTE — Telephone Encounter (Signed)
Dx:  Carcinoma of lower-outer quadrant of ...   Ref Range & Units 38mo ago  TSH 0.450 - 4.500 uIU/mL 3.820   T4, Total 4.5 - 12.0 ug/dL 8.6   T3 Uptake Ratio 24 - 39 % 22Low    Free Thyroxine Index 1.2 - 4.9 1.9

## 2018-01-30 NOTE — Telephone Encounter (Signed)
Dr. Jacinto Reap - tsh last check 3 months ago.

## 2018-01-31 ENCOUNTER — Inpatient Hospital Stay: Payer: BLUE CROSS/BLUE SHIELD | Attending: Internal Medicine | Admitting: Internal Medicine

## 2018-01-31 ENCOUNTER — Inpatient Hospital Stay: Payer: BLUE CROSS/BLUE SHIELD

## 2018-01-31 ENCOUNTER — Ambulatory Visit: Payer: BLUE CROSS/BLUE SHIELD

## 2018-01-31 ENCOUNTER — Other Ambulatory Visit: Payer: Self-pay

## 2018-01-31 ENCOUNTER — Encounter: Payer: Self-pay | Admitting: Internal Medicine

## 2018-01-31 VITALS — BP 129/83 | HR 105 | Temp 98.6°F | Resp 20 | Ht 62.0 in | Wt 183.0 lb

## 2018-01-31 DIAGNOSIS — Z79818 Long term (current) use of other agents affecting estrogen receptors and estrogen levels: Secondary | ICD-10-CM | POA: Insufficient documentation

## 2018-01-31 DIAGNOSIS — C50511 Malignant neoplasm of lower-outer quadrant of right female breast: Secondary | ICD-10-CM

## 2018-01-31 DIAGNOSIS — R197 Diarrhea, unspecified: Secondary | ICD-10-CM

## 2018-01-31 DIAGNOSIS — N182 Chronic kidney disease, stage 2 (mild): Secondary | ICD-10-CM | POA: Diagnosis not present

## 2018-01-31 DIAGNOSIS — Z170 Estrogen receptor positive status [ER+]: Secondary | ICD-10-CM | POA: Diagnosis not present

## 2018-01-31 DIAGNOSIS — Z7984 Long term (current) use of oral hypoglycemic drugs: Secondary | ICD-10-CM | POA: Diagnosis not present

## 2018-01-31 DIAGNOSIS — E039 Hypothyroidism, unspecified: Secondary | ICD-10-CM | POA: Insufficient documentation

## 2018-01-31 DIAGNOSIS — E1165 Type 2 diabetes mellitus with hyperglycemia: Secondary | ICD-10-CM | POA: Diagnosis not present

## 2018-01-31 DIAGNOSIS — E1122 Type 2 diabetes mellitus with diabetic chronic kidney disease: Secondary | ICD-10-CM | POA: Insufficient documentation

## 2018-01-31 DIAGNOSIS — R5383 Other fatigue: Secondary | ICD-10-CM | POA: Insufficient documentation

## 2018-01-31 DIAGNOSIS — I129 Hypertensive chronic kidney disease with stage 1 through stage 4 chronic kidney disease, or unspecified chronic kidney disease: Secondary | ICD-10-CM | POA: Insufficient documentation

## 2018-01-31 DIAGNOSIS — Z5111 Encounter for antineoplastic chemotherapy: Secondary | ICD-10-CM | POA: Diagnosis present

## 2018-01-31 DIAGNOSIS — R7989 Other specified abnormal findings of blood chemistry: Secondary | ICD-10-CM

## 2018-01-31 LAB — CBC WITH DIFFERENTIAL/PLATELET
BASOS ABS: 0.1 10*3/uL (ref 0–0.1)
BASOS PCT: 3 %
Eosinophils Absolute: 0.1 10*3/uL (ref 0–0.7)
Eosinophils Relative: 3 %
HEMATOCRIT: 34.4 % — AB (ref 35.0–47.0)
HEMOGLOBIN: 12 g/dL (ref 12.0–16.0)
LYMPHS PCT: 47 %
Lymphs Abs: 1.7 10*3/uL (ref 1.0–3.6)
MCH: 33.8 pg (ref 26.0–34.0)
MCHC: 34.8 g/dL (ref 32.0–36.0)
MCV: 97.1 fL (ref 80.0–100.0)
MONO ABS: 0.2 10*3/uL (ref 0.2–0.9)
Monocytes Relative: 5 %
NEUTROS ABS: 1.5 10*3/uL (ref 1.4–6.5)
NEUTROS PCT: 42 %
Platelets: 284 10*3/uL (ref 150–440)
RBC: 3.54 MIL/uL — AB (ref 3.80–5.20)
RDW: 13.5 % (ref 11.5–14.5)
WBC: 3.7 10*3/uL (ref 3.6–11.0)

## 2018-01-31 LAB — COMPREHENSIVE METABOLIC PANEL
ALBUMIN: 4.1 g/dL (ref 3.5–5.0)
ALT: 26 U/L (ref 14–54)
ANION GAP: 12 (ref 5–15)
AST: 40 U/L (ref 15–41)
Alkaline Phosphatase: 52 U/L (ref 38–126)
BUN: 17 mg/dL (ref 6–20)
CO2: 21 mmol/L — ABNORMAL LOW (ref 22–32)
Calcium: 9.2 mg/dL (ref 8.9–10.3)
Chloride: 101 mmol/L (ref 101–111)
Creatinine, Ser: 1.16 mg/dL — ABNORMAL HIGH (ref 0.44–1.00)
GFR calc non Af Amer: 49 mL/min — ABNORMAL LOW (ref >60)
GFR, EST AFRICAN AMERICAN: 57 mL/min — AB (ref >60)
GLUCOSE: 183 mg/dL — AB (ref 65–99)
POTASSIUM: 4.4 mmol/L (ref 3.5–5.1)
Sodium: 134 mmol/L — ABNORMAL LOW (ref 135–145)
Total Bilirubin: 0.5 mg/dL (ref 0.3–1.2)
Total Protein: 7.5 g/dL (ref 6.5–8.1)

## 2018-01-31 LAB — TSH: TSH: 3.99 u[IU]/mL (ref 0.350–4.500)

## 2018-01-31 MED ORDER — FULVESTRANT 250 MG/5ML IM SOLN
500.0000 mg | Freq: Once | INTRAMUSCULAR | Status: AC
  Administered 2018-01-31: 500 mg via INTRAMUSCULAR

## 2018-01-31 NOTE — Progress Notes (Signed)
[facility] OFFICE PROGRESS NOTE  Patient Care Team: Karen Kitchens, MD as PCP - General (Family Medicine) Neldon Mc, MD (General Surgery) Everlene Farrier, MD (Obstetrics and Gynecology) Noreene Filbert, MD Forest Gleason, MD (Unknown Physician Specialty)  Cancer Staging Carcinoma of lower-outer quadrant of right breast in female, estrogen receptor positive [facility]) Staging form: Breast, AJCC 7th Edition - Pathologic: ypT1c,ypN2a, MX - Signed by Haywood Lasso, MD on 03/10/2012    Oncology History   # DEC 2012- RIGHT BREAST CA T2 N2 M0 tumor from biopsy.  Estrogen receptor positive, Progesterone receptor positive.  Current receptor negative by FISH 2. Neoadjuvant chemotherapy started in December of 28 with Cytoxan Adriamycin 3. Started on Taxol weekly chemotherapy. 4. Patient finished 12  cycles of Taxol chemotherapy in May of 2013.     5. Status post right modified radical mastectomy [Dr.Bowers; [place]] June of 2013, ypT1c  yp N2  MO. started also on Lerazole    7. Radiation therapy to the right breast (September of 2013).  Lymph node was positive for HER-2 receptor gene amplification of 2.22.  Will proceed with Herceptin treatment starting in September of 2013.   8.Patient has finished Herceptin (maintenance therapy) in August of 2014 8. Start patient on letrozole from November, 2013. 9. Patient started on Herceptin in September 2013.   10Patient finished 12 months of Herceptin therapy on August, 2014  # 6. Status post left side prophylactic mastectomy.  #Late MAY 2018-RECURRENCE BREAST CA- ER positive/PR negative; ?? HER-2/neu- [biopsy- proven-mediastinal lymph node; in suff for her 2 testing].  [elevated Tumor marker- CT/PET- uptake in Right Mediastinal LN; Sternum [June 2018 EBUS- Dr.Kasa]   # March 10 2017- faslodex + Abema; OCT 5th CT-PR of mediastinal LN --------------------------------------------    DIAGNOSIS: [ BREAST CANCER- ER/PR/HER2 NEU  POS  STAGE:  4   ;GOALS: PALLIATIVE  CURRENT/MOST RECENT THERAPY - ABEMA + FASLODEX      Carcinoma of lower-outer quadrant of right breast in female, estrogen receptor positive ([place])      INTERVAL HISTORY:  Kimberly [name] 62 y.o.  female pleasant patient above history of metastatic breast cancer is here for follow-up.  Patient complains of mild fatigue.  Otherwise denies any nausea vomiting.  Complains of 1-2 loose stools a day.  Review of Systems  Constitutional: Positive for malaise/fatigue. Negative for chills, diaphoresis, fever and weight loss.  HENT: Negative for nosebleeds and sore throat.   Eyes: Negative for double vision.  Respiratory: Negative for cough, hemoptysis, sputum production, shortness of breath and wheezing.   Cardiovascular: Negative for chest pain, palpitations, orthopnea and leg swelling.  Gastrointestinal: Positive for diarrhea. Negative for abdominal pain, blood in stool, constipation, heartburn, melena, nausea and vomiting.  Genitourinary: Negative for dysuria, frequency and urgency.  Musculoskeletal: Negative for back pain and joint pain.  Skin: Negative.  Negative for itching and rash.  Neurological: Negative for dizziness, tingling, focal weakness, weakness and headaches.  Endo/Heme/Allergies: Does not bruise/bleed easily.  Psychiatric/Behavioral: Negative for depression. The patient is not nervous/anxious and does not have insomnia.       PAST MEDICAL HISTORY :  Past Medical History:  Diagnosis Date  . Arthritis   . Cancer of lower-outer quadrant of female breast ([place]) 08/06/2011   RIGHT   . Diabetes mellitus ([place]) 03/24/2012  . High cholesterol   . History of kidney stones   . Hx of bilateral breast implants   . Hypertension   . PONV (postoperative nausea and vomiting)   .  Type II diabetes mellitus ([facility])    fasting 140-150  . Vertigo    LAST WEEK  . Vertigo 01/2017    PAST SURGICAL HISTORY :   Past Surgical History:  Procedure  Laterality Date  . BREAST BIOPSY  07/2011   right  . BREAST RECONSTRUCTION  03/07/2012   Procedure: BREAST RECONSTRUCTION;  Surgeon: Crissie Reese, MD;  Location: [facility];  Service: Plastics;  Laterality: Left;  BREAST RECONSTRUCTION WITH PLACEMENT OF TISSUE EXPANDER TO LEFT BREAST  . BREAST RECONSTRUCTION  02/06/2013  . CESAREAN SECTION  [id number]  . ENDOBRONCHIAL ULTRASOUND N/A 02/24/2017   Procedure: ENDOBRONCHIAL ULTRASOUND;  Surgeon: [name], MD;  Location: [facility];  Service: Cardiopulmonary;  Laterality: N/A;  . LATISSIMUS FLAP TO BREAST Right 02/06/2013   Procedure: LATISSIMUS FLAP TO RIGHT BREAST WITH IMPLANT;  Surgeon: Crissie Reese, MD;  Location: [facility];  Service: Plastics;  Laterality: Right;  . MASTECTOMY  03/07/12   modified right; total left  . MODIFIED MASTECTOMY  03/07/2012   Procedure: MODIFIED MASTECTOMY;  Surgeon: Haywood Lasso, MD;  Location: [facility];  Service: General;  Laterality: Right;  . PORTACATH PLACEMENT  07/2011  . SCAR REVISION  03/30/2012   Procedure: SCAR REVISION;  Surgeon: Haywood Lasso, MD;  Location: [facility];  Service: General;  Laterality: Right;  CLOSURE OF MASTECTOMY INCISION  . WISDOM TOOTH EXTRACTION      FAMILY HISTORY :   Family History  Problem Relation Age of Onset  . Cancer Mother        breast    SOCIAL HISTORY:   Social History   Tobacco Use  . Smoking status: Never Smoker  . Smokeless tobacco: Never Used  Substance Use Topics  . Alcohol use: No  . Drug use: No    ALLERGIES:  is allergic to sulfa antibiotics.  MEDICATIONS:  Current Outpatient Medications  Medication Sig Dispense Refill  . atorvastatin (LIPITOR) 20 MG tablet Take 20 mg by mouth every morning.    . Calcium Carbonate-Vitamin D (CALCIUM 600 + D PO) Take 1 tablet by mouth 2 (two) times daily.    . fulvestrant (FASLODEX) 250 MG/5ML injection Inject into the muscle every 30 (thirty) days. One injection each buttock over 1-2 minutes. Warm prior to  use.    Marland Kitchen glipiZIDE (GLUCOTROL) 10 MG tablet Take 10 mg by mouth daily before breakfast.    . lisinopril (PRINIVIL,ZESTRIL) 10 MG tablet Take 10 mg by mouth every morning.     . ondansetron (ZOFRAN) 8 MG tablet Take 1 tablet (8 mg total) every 8 (eight) hours as needed by mouth for nausea or vomiting. 20 tablet 3  . pantoprazole (PROTONIX) 40 MG tablet TAKE 1 TABLET (40 MG TOTAL) BY MOUTH DAILY. 30 MINS PRIOR TO MEALS ON EMPTY STOMACH 30 tablet 0  . prochlorperazine (COMPAZINE) 10 MG tablet Take 1 tablet (10 mg total) by mouth every 6 (six) hours as needed for nausea or vomiting. 30 tablet 0  . Semaglutide (OZEMPIC) 0.25 or 0.5 MG/DOSE SOPN Inject 0.25 mg into the skin once a week.    Marland Kitchen VERZENIO 100 MG tablet TAKE 1 TABLET (100 MG TOTAL) BY MOUTH 2 (TWO) TIMES DAILY. 56 tablet 4  . levothyroxine (SYNTHROID, LEVOTHROID) 25 MCG tablet TAKE 1 TABLET (25 MCG TOTAL) BY MOUTH DAILY BEFORE BREAKFAST. 30 tablet 2  . loperamide (IMODIUM A-D) 2 MG tablet Take 2 mg by mouth 4 (four) times daily as needed for diarrhea or loose stools.  No current facility-administered medications for this visit.     PHYSICAL EXAMINATION: ECOG PERFORMANCE STATUS: 0 - Asymptomatic  BP 129/83   Pulse (!) 105   Temp 98.6 F (37 C) (Tympanic)   Resp 20   Ht 5' 2" (1.575 m)   Wt 183 lb (83 kg)   BMI 33.47 kg/m   Filed Weights   01/31/18 0849  Weight: 183 lb (83 kg)    GENERAL: Well-nourished well-developed; Alert, no distress and comfortable.  Accompanied by husband. EYES: no pallor or icterus OROPHARYNX: no thrush or ulceration; NECK: supple; no lymph nodes felt. LYMPH:  no palpable lymphadenopathy in the axillary or inguinal regions LUNGS: Decreased breath sounds auscultation bilaterally. No wheeze or crackles HEART/CVS: regular rate & rhythm and no murmurs; No lower extremity edema ABDOMEN:abdomen soft, non-tender and normal bowel sounds. No hepatomegaly or splenomegaly.  Musculoskeletal:no cyanosis of  digits and no clubbing  PSYCH: alert & oriented x 3 with fluent speech NEURO: no focal motor/sensory deficits SKIN:  no rashes or significant lesions    LABORATORY DATA:  I have reviewed the data as listed    Component Value Date/Time   NA 134 (L) 01/31/2018 0824   NA 135 11/28/2014 1401   K 4.4 01/31/2018 0824   K 4.7 11/28/2014 1401   CL 101 01/31/2018 0824   CL 99 (L) 11/28/2014 1401   CO2 21 (L) 01/31/2018 0824   CO2 28 11/28/2014 1401   GLUCOSE 183 (H) 01/31/2018 0824   GLUCOSE 215 (H) 11/28/2014 1401   BUN 17 01/31/2018 0824   BUN 16 11/28/2014 1401   CREATININE 1.16 (H) 01/31/2018 0824   CREATININE 0.96 11/28/2014 1401   CALCIUM 9.2 01/31/2018 0824   CALCIUM 9.9 11/28/2014 1401   PROT 7.5 01/31/2018 0824   PROT 7.6 11/28/2014 1401   ALBUMIN 4.1 01/31/2018 0824   ALBUMIN 4.5 11/28/2014 1401   AST 40 01/31/2018 0824   AST 26 11/28/2014 1401   ALT 26 01/31/2018 0824   ALT 28 11/28/2014 1401   ALKPHOS 52 01/31/2018 0824   ALKPHOS 78 11/28/2014 1401   BILITOT 0.5 01/31/2018 0824   BILITOT 0.5 11/28/2014 1401   GFRNONAA 49 (L) 01/31/2018 0824   GFRNONAA >60 11/28/2014 1401   GFRAA 57 (L) 01/31/2018 0824   GFRAA >60 11/28/2014 1401    No results found for: SPEP, UPEP  Lab Results  Component Value Date   WBC 3.7 01/31/2018   NEUTROABS 1.5 01/31/2018   HGB 12.0 01/31/2018   HCT 34.4 (L) 01/31/2018   MCV 97.1 01/31/2018   PLT 284 01/31/2018      Chemistry      Component Value Date/Time   NA 134 (L) 01/31/2018 0824   NA 135 11/28/2014 1401   K 4.4 01/31/2018 0824   K 4.7 11/28/2014 1401   CL 101 01/31/2018 0824   CL 99 (L) 11/28/2014 1401   CO2 21 (L) 01/31/2018 0824   CO2 28 11/28/2014 1401   BUN 17 01/31/2018 0824   BUN 16 11/28/2014 1401   CREATININE 1.16 (H) 01/31/2018 0824   CREATININE 0.96 11/28/2014 1401      Component Value Date/Time   CALCIUM 9.2 01/31/2018 0824   CALCIUM 9.9 11/28/2014 1401   ALKPHOS 52 01/31/2018 0824   ALKPHOS 78  11/28/2014 1401   AST 40 01/31/2018 0824   AST 26 11/28/2014 1401   ALT 26 01/31/2018 0824   ALT 28 11/28/2014 1401   BILITOT 0.5 01/31/2018 0824   BILITOT  0.5 11/28/2014 1401       RADIOGRAPHIC STUDIES: I have personally reviewed the radiological images as listed and agreed with the findings in the report. No results found.   ASSESSMENT & PLAN:  Carcinoma of lower-outer quadrant of right breast in female, estrogen receptor positive ([place]) #Recurrent breast cancer-ER positive PR negative ? HER-2/neu-currently on Faslodex plus abema.  Stable  #Continue Faslodex plus abema; tolerating well no evidence of progression.  Plan CT scan end of June  # Hypothyroidism- on synthroid; stable awaiting repeat TSH today.  # Poorly controlled Blood glucose- improved; FBS- 183.   # CKD-II; creatinine 1.2. Stable.  # follow up on July 11th;[place];  with labs ca-27-29; faslodex/MD; CT scan in week of 24th can leave results of phone.    Orders Placed This Encounter  Procedures  . CT CHEST W CONTRAST    Standing Status:   Future    Standing Expiration Date:   02/01/2019    Order Specific Question:   If indicated for the ordered procedure, I authorize the administration of contrast media per Radiology protocol    Answer:   Yes    Order Specific Question:   Preferred imaging location?    Answer:   [facility]    Order Specific Question:   Radiology Contrast Protocol - do NOT remove file path    Answer:   _0 [email or web address]  . CT Abdomen Pelvis W Contrast    Standing Status:   Future    Standing Expiration Date:   01/31/2019    Order Specific Question:   If indicated for the ordered procedure, I authorize the administration of contrast media per Radiology protocol    Answer:   Yes    Order Specific Question:   Preferred imaging location?    Answer:   [facility]    Order Specific Question:   Is Oral Contrast requested for this exam?    Answer:   Yes, Per  Radiology protocol    Order Specific Question:   Radiology Contrast Protocol - do NOT remove file path    Answer:   _1 [email or web address]  . CBC with Differential    Standing Status:   Future    Standing Expiration Date:   02/01/2019  . Comprehensive metabolic panel    Standing Status:   Future    Standing Expiration Date:   02/01/2019  . Cancer antigen 27.29    Standing Status:   Future    Standing Expiration Date:   02/01/2019   All questions were answered. The patient knows to call the clinic with any problems, questions or concerns.      [name], MD 02/01/2018 10:49 PM

## 2018-01-31 NOTE — Telephone Encounter (Signed)
Per md - keep patient on the same synthroid dosing. Patient made aware.

## 2018-01-31 NOTE — Telephone Encounter (Signed)
   Ref Range & Units 08:24 52mo ago  TSH 0.350 - 4.500 uIU/mL 3.990  3.820 R

## 2018-01-31 NOTE — Assessment & Plan Note (Addendum)
#  Recurrent breast cancer-ER positive PR negative ? HER-2/neu-currently on Faslodex plus abema.  Stable  #Continue Faslodex plus abema; tolerating well no evidence of progression.  Plan CT scan end of June  # Hypothyroidism- on synthroid; stable awaiting repeat TSH today.  # Poorly controlled Blood glucose- improved; FBS- 183.   # CKD-II; creatinine 1.2. Stable.  # follow up on July 11th;[place];  with labs ca-27-29; faslodex/MD; CT scan in week of 24th can leave results of phone.

## 2018-02-01 ENCOUNTER — Telehealth: Payer: Self-pay | Admitting: *Deleted

## 2018-02-01 LAB — CANCER ANTIGEN 27.29: CA 27.29: 29.8 U/mL (ref 0.0–38.6)

## 2018-02-01 NOTE — Telephone Encounter (Signed)
Contacted patient ca27.29 - results 29.8. Patient gave verbal understanding of the results and thanked me for calling.

## 2018-02-04 ENCOUNTER — Other Ambulatory Visit: Payer: Self-pay

## 2018-02-04 ENCOUNTER — Emergency Department: Payer: BLUE CROSS/BLUE SHIELD

## 2018-02-04 ENCOUNTER — Observation Stay
Admission: EM | Admit: 2018-02-04 | Discharge: 2018-02-05 | Disposition: A | Discharge status: Home / Self Care | Payer: BLUE CROSS/BLUE SHIELD | Attending: Internal Medicine | Admitting: Internal Medicine

## 2018-02-04 DIAGNOSIS — Z87442 Personal history of urinary calculi: Secondary | ICD-10-CM | POA: Insufficient documentation

## 2018-02-04 DIAGNOSIS — Z7984 Long term (current) use of oral hypoglycemic drugs: Secondary | ICD-10-CM | POA: Insufficient documentation

## 2018-02-04 DIAGNOSIS — Z79899 Other long term (current) drug therapy: Secondary | ICD-10-CM | POA: Diagnosis not present

## 2018-02-04 DIAGNOSIS — K8020 Calculus of gallbladder without cholecystitis without obstruction: Secondary | ICD-10-CM | POA: Insufficient documentation

## 2018-02-04 DIAGNOSIS — Z9221 Personal history of antineoplastic chemotherapy: Secondary | ICD-10-CM | POA: Diagnosis not present

## 2018-02-04 DIAGNOSIS — E039 Hypothyroidism, unspecified: Secondary | ICD-10-CM | POA: Diagnosis not present

## 2018-02-04 DIAGNOSIS — N182 Chronic kidney disease, stage 2 (mild): Secondary | ICD-10-CM | POA: Insufficient documentation

## 2018-02-04 DIAGNOSIS — E785 Hyperlipidemia, unspecified: Secondary | ICD-10-CM | POA: Diagnosis not present

## 2018-02-04 DIAGNOSIS — Z853 Personal history of malignant neoplasm of breast: Secondary | ICD-10-CM | POA: Diagnosis not present

## 2018-02-04 DIAGNOSIS — R079 Chest pain, unspecified: Secondary | ICD-10-CM | POA: Diagnosis present

## 2018-02-04 DIAGNOSIS — I129 Hypertensive chronic kidney disease with stage 1 through stage 4 chronic kidney disease, or unspecified chronic kidney disease: Secondary | ICD-10-CM | POA: Diagnosis not present

## 2018-02-04 DIAGNOSIS — J209 Acute bronchitis, unspecified: Secondary | ICD-10-CM

## 2018-02-04 DIAGNOSIS — Z79890 Hormone replacement therapy (postmenopausal): Secondary | ICD-10-CM | POA: Insufficient documentation

## 2018-02-04 DIAGNOSIS — Z923 Personal history of irradiation: Secondary | ICD-10-CM | POA: Diagnosis not present

## 2018-02-04 DIAGNOSIS — I25110 Atherosclerotic heart disease of native coronary artery with unstable angina pectoris: Secondary | ICD-10-CM | POA: Diagnosis not present

## 2018-02-04 DIAGNOSIS — I200 Unstable angina: Secondary | ICD-10-CM | POA: Diagnosis present

## 2018-02-04 DIAGNOSIS — Z9013 Acquired absence of bilateral breasts and nipples: Secondary | ICD-10-CM | POA: Insufficient documentation

## 2018-02-04 DIAGNOSIS — Z882 Allergy status to sulfonamides status: Secondary | ICD-10-CM | POA: Diagnosis not present

## 2018-02-04 DIAGNOSIS — Z803 Family history of malignant neoplasm of breast: Secondary | ICD-10-CM | POA: Diagnosis not present

## 2018-02-04 DIAGNOSIS — Z9882 Breast implant status: Secondary | ICD-10-CM | POA: Diagnosis not present

## 2018-02-04 DIAGNOSIS — E1122 Type 2 diabetes mellitus with diabetic chronic kidney disease: Secondary | ICD-10-CM | POA: Diagnosis not present

## 2018-02-04 DIAGNOSIS — J440 Chronic obstructive pulmonary disease with acute lower respiratory infection: Secondary | ICD-10-CM

## 2018-02-04 LAB — COMPREHENSIVE METABOLIC PANEL
ALK PHOS: 51 U/L (ref 38–126)
ALT: 27 U/L (ref 14–54)
AST: 57 U/L — ABNORMAL HIGH (ref 15–41)
Albumin: 4 g/dL (ref 3.5–5.0)
Anion gap: 13 (ref 5–15)
BILIRUBIN TOTAL: 0.7 mg/dL (ref 0.3–1.2)
BUN: 21 mg/dL — ABNORMAL HIGH (ref 6–20)
CALCIUM: 10.5 mg/dL — AB (ref 8.9–10.3)
CO2: 23 mmol/L (ref 22–32)
CREATININE: 1.05 mg/dL — AB (ref 0.44–1.00)
Chloride: 97 mmol/L — ABNORMAL LOW (ref 101–111)
GFR calc Af Amer: >60 mL/min (ref >60)
GFR calc non Af Amer: 55 mL/min — ABNORMAL LOW (ref >60)
Glucose, Bld: 215 mg/dL — ABNORMAL HIGH (ref 65–99)
Potassium: 4.5 mmol/L (ref 3.5–5.1)
SODIUM: 133 mmol/L — AB (ref 135–145)
TOTAL PROTEIN: 7.6 g/dL (ref 6.5–8.1)

## 2018-02-04 LAB — CBC
HEMATOCRIT: 35.1 % (ref 35.0–47.0)
Hemoglobin: 12.3 g/dL (ref 12.0–16.0)
MCH: 34.3 pg — AB (ref 26.0–34.0)
MCHC: 35 g/dL (ref 32.0–36.0)
MCV: 98 fL (ref 80.0–100.0)
Platelets: 306 10*3/uL (ref 150–440)
RBC: 3.58 MIL/uL — ABNORMAL LOW (ref 3.80–5.20)
RDW: 13.7 % (ref 11.5–14.5)
WBC: 4.5 10*3/uL (ref 3.6–11.0)

## 2018-02-04 LAB — FIBRIN DERIVATIVES D-DIMER (ARMC ONLY): Fibrin derivatives D-dimer (ARMC): 749.28 ng/mL (FEU) — ABNORMAL HIGH (ref 0.00–499.00)

## 2018-02-04 LAB — APTT

## 2018-02-04 LAB — TROPONIN I: Troponin I: <0.03 ng/mL (ref <0.03)

## 2018-02-04 LAB — PROTIME-INR
INR: 1.08
Prothrombin Time: 13.9 seconds (ref 11.4–15.2)

## 2018-02-04 LAB — LIPASE, BLOOD: Lipase: 37 U/L (ref 11–51)

## 2018-02-04 MED ORDER — GI COCKTAIL ~~LOC~~
30.0000 mL | ORAL | Status: AC
  Administered 2018-02-04: 30 mL via ORAL
  Filled 2018-02-04: qty 30

## 2018-02-04 MED ORDER — PROMETHAZINE HCL 25 MG/ML IJ SOLN
INTRAMUSCULAR | Status: AC
  Administered 2018-02-04: 25 mg via INTRAVENOUS
  Filled 2018-02-04: qty 1

## 2018-02-04 MED ORDER — ONDANSETRON HCL 4 MG/2ML IJ SOLN
4.0000 mg | Freq: Once | INTRAMUSCULAR | Status: AC
  Administered 2018-02-04: 4 mg via INTRAVENOUS
  Filled 2018-02-04: qty 2

## 2018-02-04 MED ORDER — PROMETHAZINE HCL 25 MG/ML IJ SOLN
25.0000 mg | Freq: Once | INTRAMUSCULAR | Status: AC
  Administered 2018-02-04: 25 mg via INTRAVENOUS

## 2018-02-04 MED ORDER — HEPARIN BOLUS VIA INFUSION
4000.0000 [IU] | Freq: Once | INTRAVENOUS | Status: AC
Start: 2018-02-04 — End: 2018-02-04
  Administered 2018-02-04: 4000 [IU] via INTRAVENOUS
  Filled 2018-02-04: qty 4000

## 2018-02-04 MED ORDER — IOPAMIDOL (ISOVUE-370) INJECTION 76%
75.0000 mL | Freq: Once | INTRAVENOUS | Status: AC | PRN
  Administered 2018-02-04: 100 mL via INTRAVENOUS

## 2018-02-04 MED ORDER — FAMOTIDINE 20 MG PO TABS
40.0000 mg | ORAL_TABLET | Freq: Once | ORAL | Status: AC
  Administered 2018-02-04: 40 mg via ORAL
  Filled 2018-02-04: qty 2

## 2018-02-04 MED ORDER — HEPARIN (PORCINE) IN NACL 100-0.45 UNIT/ML-% IJ SOLN
800.0000 [IU]/h | INTRAMUSCULAR | Status: DC
  Administered 2018-02-04: 800 [IU]/h via INTRAVENOUS
  Filled 2018-02-04: qty 250

## 2018-02-04 NOTE — ED Triage Notes (Signed)
Pt arrive [facility] for chest tightness. Started an hour ago, thought it was heartburn. Took 3 tums. ST with EMS, EMS reports anxious. EMS BP 170's. CBG 210. Hx DM type 2. Arrives shaky, but no hx anxiety.  EMS gave 324 ASA. 1 ntiro spray enroute.  Hx breast CA, bilat mastectomy 6 years ago.  Alert, oriented, no distress noted upon arrival.

## 2018-02-04 NOTE — Progress Notes (Signed)
ANTICOAGULATION CONSULT NOTE - Initial Consult  Pharmacy Consult for heparin Indication: chest pain/ACS  Allergies  Allergen Reactions  . Sulfa Antibiotics Rash    Patient Measurements: Height: 5\' 2"  (157.5 cm) Weight: 183 lb (83 kg) IBW/kg (Calculated) : 50.1 Heparin Dosing Weight: 69 kg  Vital Signs: Temp: 98.1 F (36.7 C) (06/08 1610) Temp Source: Oral (06/08 1610) BP: 139/76 (06/08 2154) Pulse Rate: 115 (06/08 2154)  Labs: Recent Labs    02/04/18 1619 02/04/18 2002  HGB 12.3  --   HCT 35.1  --   PLT 306  --   CREATININE 1.05*  --   TROPONINI <0.03 <0.03    Estimated Creatinine Clearance: 54.8 mL/min (A) (by C-G formula based on SCr of 1.05 mg/dL (H)).   Medical History: Past Medical History:  Diagnosis Date  . Arthritis   . Cancer of lower-outer quadrant of female breast ([place]) 08/06/2011   RIGHT   . Diabetes mellitus ([place]) 03/24/2012  . High cholesterol   . History of kidney stones   . Hx of bilateral breast implants   . Hypertension   . PONV (postoperative nausea and vomiting)   . Type II diabetes mellitus ([facility])    fasting 140-150  . Vertigo    LAST WEEK  . Vertigo 01/2017    Medications:  Scheduled:  . heparin  4,000 Units Intravenous Once    Assessment: Patient w/ a h/o breast CA here for chest tightness that feels like heartburn. Patient took tums w/o relief. Was given ASA 325 and nitro spray via EMS not sure if patient had relief with this. Considering this UA w/ a TIMI score of 3 patient may not warrant full-dose anticoagulation. Patient is not on any anticoagulation PTA [place] negative x 2 EKG sinus rhythm  Goal of Therapy:  Heparin level 0.3-0.7 units/ml Monitor platelets by anticoagulation protocol: Yes   Plan:  Will bolus w/ heparin 4000 units IV x 1 Will start rate @ 800 units/hr Will check an anti-Xa w/ am labs. Baseline labs drawn Will monitor daily CBC's and adjust per anti-Xa levels.  Tobie Lords, PharmD, BCPS Clinical  Pharmacist 02/04/2018

## 2018-02-04 NOTE — H&P (Signed)
[facility] [place] NAME: Kimberly [name]    MR#:  [id number]  DATE OF BIRTH:  05/08/1956  DATE OF ADMISSION:  02/04/2018  PRIMARY CARE PHYSICIAN: Karen Kitchens, MD   REQUESTING/REFERRING PHYSICIAN:   CHIEF COMPLAINT:   Chief Complaint  Patient presents with  . Chest Pain    HISTORY OF PRESENT ILLNESS: Kimberly [name]  is a 62 y.o. female with a known history of breast cancer status post bilateral mastectomy, considered to be in remission.  She also has diabetes type 2, hypertension and hyperlipidemia.  Non-smoker. Patient presented to emergency room with severe chest tightness, started around 3 PM, earlier today, while at rest, at home.  The chest tightness does not radiate anywhere patient denies any associated shortness of breath, fever,  diaphoresis, nausea or vomiting.  Patient states her symptoms are similar to prior heartburn episodes, that we usually go away with Tums.  However, Tums did not help today.  Aspirin and nitro spray did not improve her symptoms either. Blood test done emergency room are remarkable for sodium level at 133 and creatinine level of 1.05.  First 2 troponin levels are lower than 0.03. EKG, reviewed by myself, shows normal sinus rhythm with heart rate at 94; normal axis and intervals; no ST-T changes. She is admitted as observation for further evaluation and treatment.  PAST MEDICAL HISTORY:   Past Medical History:  Diagnosis Date  . Arthritis   . Cancer of lower-outer quadrant of female breast ([place]) 08/06/2011   RIGHT   . Diabetes mellitus ([place]) 03/24/2012  . High cholesterol   . History of kidney stones   . Hx of bilateral breast implants   . Hypertension   . PONV (postoperative nausea and vomiting)   . Type II diabetes mellitus ([facility])    fasting 140-150  . Vertigo    LAST WEEK  . Vertigo 01/2017    PAST SURGICAL HISTORY:  Past Surgical History:  Procedure Laterality Date  . BREAST  BIOPSY  07/2011   right  . BREAST RECONSTRUCTION  03/07/2012   Procedure: BREAST RECONSTRUCTION;  Surgeon: Crissie Reese, MD;  Location: [facility];  Service: Plastics;  Laterality: Left;  BREAST RECONSTRUCTION WITH PLACEMENT OF TISSUE EXPANDER TO LEFT BREAST  . BREAST RECONSTRUCTION  02/06/2013  . CESAREAN SECTION  [id number]  . ENDOBRONCHIAL ULTRASOUND N/A 02/24/2017   Procedure: ENDOBRONCHIAL ULTRASOUND;  Surgeon: [name], MD;  Location: [facility];  Service: Cardiopulmonary;  Laterality: N/A;  . LATISSIMUS FLAP TO BREAST Right 02/06/2013   Procedure: LATISSIMUS FLAP TO RIGHT BREAST WITH IMPLANT;  Surgeon: Crissie Reese, MD;  Location: [facility];  Service: Plastics;  Laterality: Right;  . MASTECTOMY  03/07/12   modified right; total left  . MODIFIED MASTECTOMY  03/07/2012   Procedure: MODIFIED MASTECTOMY;  Surgeon: Haywood Lasso, MD;  Location: [facility];  Service: General;  Laterality: Right;  . PORTACATH PLACEMENT  07/2011  . SCAR REVISION  03/30/2012   Procedure: SCAR REVISION;  Surgeon: Haywood Lasso, MD;  Location: [facility];  Service: General;  Laterality: Right;  CLOSURE OF MASTECTOMY INCISION  . WISDOM TOOTH EXTRACTION      SOCIAL HISTORY:  Social History   Tobacco Use  . Smoking status: Never Smoker  . Smokeless tobacco: Never Used  Substance Use Topics  . Alcohol use: No    FAMILY HISTORY:  Family History  Problem Relation Age of Onset  . Cancer Mother  breast    DRUG ALLERGIES:  Allergies  Allergen Reactions  . Sulfa Antibiotics Rash    REVIEW OF SYSTEMS:   CONSTITUTIONAL: No fever, fatigue or weakness.  EYES: No blurred or double vision.  EARS, NOSE, AND THROAT: No tinnitus or ear pain.  RESPIRATORY: No cough, shortness of breath, wheezing or hemoptysis.  CARDIOVASCULAR: Positive for chest tightness, no orthopnea, edema.  GASTROINTESTINAL: No nausea, vomiting, diarrhea or abdominal pain.  GENITOURINARY: No dysuria, hematuria.  ENDOCRINE: No  polyuria, nocturia,  HEMATOLOGY: No bleeding SKIN: No rash or lesion. MUSCULOSKELETAL: No joint pain.   NEUROLOGIC: No tingling, numbness, weakness.  PSYCHIATRY: No anxiety or depression.   MEDICATIONS AT HOME:  Prior to Admission medications   Medication Sig Start Date End Date Taking? Authorizing Provider  atorvastatin (LIPITOR) 20 MG tablet Take 20 mg by mouth every morning.    [provider]  Calcium Carbonate-Vitamin D (CALCIUM 600 + D PO) Take 1 tablet by mouth 2 (two) times daily.    [provider]  fulvestrant (FASLODEX) 250 MG/5ML injection Inject into the muscle every 30 (thirty) days. One injection each buttock over 1-2 minutes. Warm prior to use.    [provider]  glipiZIDE (GLUCOTROL) 10 MG tablet Take 10 mg by mouth daily before breakfast.    [provider]  levothyroxine (SYNTHROID, LEVOTHROID) 25 MCG tablet TAKE 1 TABLET (25 MCG TOTAL) BY MOUTH DAILY BEFORE BREAKFAST. 01/31/18   [name], MD  lisinopril (PRINIVIL,ZESTRIL) 10 MG tablet Take 10 mg by mouth every morning.     [provider]  loperamide (IMODIUM A-D) 2 MG tablet Take 2 mg by mouth 4 (four) times daily as needed for diarrhea or loose stools.    [provider]  ondansetron (ZOFRAN) 8 MG tablet Take 1 tablet (8 mg total) every 8 (eight) hours as needed by mouth for nausea or vomiting. 07/05/17   [name], MD  pantoprazole (PROTONIX) 40 MG tablet TAKE 1 TABLET (40 MG TOTAL) BY MOUTH DAILY. [place] ON EMPTY STOMACH 06/20/17   [name], MD  prochlorperazine (COMPAZINE) 10 MG tablet Take 1 tablet (10 mg total) by mouth every 6 (six) hours as needed for nausea or vomiting. 05/24/17   [name], MD  Semaglutide (OZEMPIC) 0.25 or 0.5 MG/DOSE SOPN Inject 0.25 mg into the skin once a week.    [provider]  VERZENIO 100 MG tablet TAKE 1 TABLET (100 MG TOTAL) BY MOUTH 2 (TWO) TIMES DAILY. 12/01/17    [name], MD      PHYSICAL EXAMINATION:   VITAL SIGNS: Blood pressure 139/76, pulse (!) 115, temperature 98.1 F (36.7 C), temperature source Oral, resp. rate (!) 22, height 5\' 2"  (1.575 m), weight 83 kg (183 lb), SpO2 99 %.  GENERAL:  62 y.o.-year-old patient lying in the bed with mild distress, secondary to chest tightness.  EYES: Pupils equal, round, reactive to light and accommodation. No scleral icterus. Extraocular muscles intact.  HEENT: Head atraumatic, normocephalic. Oropharynx and nasopharynx clear.  NECK:  Supple, no jugular venous distention. No thyroid enlargement, no tenderness.  LUNGS: Normal breath sounds bilaterally, no wheezing, rales,rhonchi or crepitation. No use of accessory muscles of respiration.  CARDIOVASCULAR: S1, S2 normal. No S3/S4.  ABDOMEN: Soft, nontender, nondistended. Bowel sounds present. No organomegaly or mass.  EXTREMITIES: No pedal edema, cyanosis, or clubbing.  NEUROLOGIC: Cranial nerves II through XII are intact. Muscle strength 5/5 in all extremities. Sensation intact.  Gait not checked.  PSYCHIATRIC: The patient is alert and oriented x 3.  SKIN: No obvious rash, lesion, or ulcer.   LABORATORY PANEL:   CBC Recent Labs  Lab 01/31/18 0824 02/04/18 1619  WBC 3.7 4.5  HGB 12.0 12.3  HCT 34.4* 35.1  PLT 284 306  MCV 97.1 98.0  MCH 33.8 34.3*  MCHC 34.8 35.0  RDW 13.5 13.7  LYMPHSABS 1.7  --   MONOABS 0.2  --   EOSABS 0.1  --   BASOSABS 0.1  --    ------------------------------------------------------------------------------------------------------------------  Chemistries  Recent Labs  Lab 01/31/18 0824 02/04/18 1619  NA 134* 133*  K 4.4 4.5  CL 101 97*  CO2 21* 23  GLUCOSE 183* 215*  BUN 17 21*  CREATININE 1.16* 1.05*  CALCIUM 9.2 10.5*  AST 40 57*  ALT 26 27  ALKPHOS 52 51  BILITOT 0.5 0.7    ------------------------------------------------------------------------------------------------------------------ estimated creatinine clearance is 54.8 mL/min (A) (by C-G formula based on SCr of 1.05 mg/dL (H)). ------------------------------------------------------------------------------------------------------------------ No results for input(s): TSH, T4TOTAL, T3FREE, THYROIDAB in the last 72 hours.  Invalid input(s): FREET3   Coagulation profile Recent Labs  Lab 02/04/18 2302  INR 1.08   ------------------------------------------------------------------------------------------------------------------- No results for input(s): DDIMER in the last 72 hours. -------------------------------------------------------------------------------------------------------------------  Cardiac Enzymes Recent Labs  Lab 02/04/18 1619 02/04/18 2002  TROPONINI <0.03 <0.03   ------------------------------------------------------------------------------------------------------------------ Invalid input(s): POCBNP  ---------------------------------------------------------------------------------------------------------------  Urinalysis    Component Value Date/Time   COLORURINE YELLOW (A) 04/25/2017 1421   APPEARANCEUR CLEAR (A) 04/25/2017 1421   APPEARANCEUR CLEAR 08/14/2012 1405   LABSPEC 1.021 04/25/2017 1421   LABSPEC >=1.030 08/14/2012 1405   PHURINE 5.0 04/25/2017 1421   GLUCOSEU NEGATIVE 04/25/2017 1421   GLUCOSEU NEGATIVE 08/14/2012 1405   HGBUR NEGATIVE 04/25/2017 1421   BILIRUBINUR NEGATIVE 04/25/2017 1421   BILIRUBINUR NEGATIVE 08/14/2012 1405   KETONESUR NEGATIVE 04/25/2017 1421   PROTEINUR NEGATIVE 04/25/2017 1421   NITRITE NEGATIVE 04/25/2017 1421   LEUKOCYTESUR TRACE (A) 04/25/2017 1421   LEUKOCYTESUR NEGATIVE 08/14/2012 1405     RADIOLOGY: Dg Chest 2 View  Result Date: 02/04/2018 CLINICAL DATA:  Acute onset chest pain and shortness of breath today. Breast  carcinoma. EXAM: CHEST - 2 VIEW COMPARISON:  01/29/2013 FINDINGS: The heart size and mediastinal contours are within normal limits. Both lungs are clear. Surgical clips are seen in both axillary regions. The visualized skeletal structures are unremarkable. IMPRESSION: No active cardiopulmonary disease. Electronically Signed   By: Earle Gell M.D.   On: 02/04/2018 17:12   Ct Angio Chest Pe W And/or Wo Contrast  Result Date: 02/04/2018 CLINICAL DATA:  Chest tightness beginning 1 hour ago with anxiety. Tachycardia. EXAM: CT ANGIOGRAPHY CHEST WITH CONTRAST TECHNIQUE: Multidetector CT imaging of the chest was performed using the standard protocol during bolus administration of intravenous contrast. Multiplanar CT image reconstructions and MIPs were obtained to evaluate the vascular anatomy. CONTRAST:  144mL ISOVUE-370 IOPAMIDOL (ISOVUE-370) INJECTION 76% COMPARISON:  09/30/2017, 01/25/2017 and PET-CT 01/31/2017 FINDINGS: Cardiovascular: Heart is normal in size. Thoracic aorta is normal in caliber. Pulmonary arterial system is well opacified without emboli. Calcified plaque over the left lateral circumflex coronary artery. Mediastinum/Nodes: 1 cm precarinal lymph node without significant change. No other significant mediastinal nodes and no evidence of hilar adenopathy. Remaining mediastinal structures are normal. Lungs/Pleura: Lungs are adequately inflated with subtle stable interstitial prominence over the right apex. No focal airspace consolidation or effusion. Mild linear atelectasis left base and lingula unchanged. Airways within normal. Upper  Abdomen: Mild cholelithiasis. 2 mm stone over the right intrarenal collecting system. Musculoskeletal: Unremarkable.  Bilateral breast implants unchanged. Review of the MIP images confirms the above findings. IMPRESSION: No acute cardiopulmonary disease and no evidence of pulmonary embolism. Stable 1 cm precarinal lymph node likely reactive. Stable minimal chronic  interstitial change over the right apex. Minimal atherosclerotic coronary artery disease. Mild cholelithiasis. Suggestion of a single punctate nonobstructing right renal stone. Electronically Signed   By: Marin Olp M.D.   On: 02/04/2018 19:07    EKG: Orders placed or performed during the hospital encounter of 02/04/18  . ED EKG within 10 minutes  . ED EKG within 10 minutes    IMPRESSION AND PLAN:  1.  Chest pain, will rule out acute coronary syndrome.  Continue to monitor patient on telemetry and follow troponin levels.  Will check 2D echo.  Cardiology is consulted for further evaluation and treatment. 2.  Stage IV recurrent, metastatic breast cancer, in remission.  Continue treatment with Faslodex and Abema, per oncology. 3.  Diabetes type 2.  Will start low-carb diet and monitor blood sugars before meals and at bedtime.  Will use insulin treatment during the hospital stay. 4.  Hypertension, stable, continue home medications. 5.  CKD 2, stable.  Creatinine is at baseline.  Continue to monitor kidney function closely and avoid nephrotoxic medications. 6.  Hyperlipidemia, on statin.  All the records are reviewed and case discussed with ED provider. Management plans discussed with the patient, family and they are in agreement.  CODE STATUS: FULL   TOTAL TIME TAKING CARE OF THIS PATIENT: 45 minutes.    Amelia Jo M.D on 02/04/2018 at 11:47 PM  Between 7am to 6pm - Pager - [phone]  After 6pm go to [email or web address] - password [email or web address] [facility]  [facility]  Office  [phone]  CC: Primary care physician; Karen Kitchens, MD

## 2018-02-04 NOTE — ED Notes (Signed)
Patient wants to take Zofran now and let it have time to work before the tries to take the PO meds.

## 2018-02-04 NOTE — ED Notes (Signed)
Pt taken to xray, family at bedside.

## 2018-02-04 NOTE — ED Provider Notes (Addendum)
[facility] Emergency Department Provider Note  ____________________________________________  Time seen: Approximately 4:36 PM  I have reviewed the triage vital signs and the nursing notes.   HISTORY  Chief Complaint Chest Pain    HPI Kimberly [name] is a 62 y.o. female she reports central chest tightness that started at about 3:00 PM today.  It is nonradiating, not associated with shortness of breath diaphoresis or vomiting.  No aggravating or alleviating factors, not pleuritic not exertional.  Feels like heartburn that she has had in the past, but unlike previous episodes, this has not resolved with Tums that she tried today.  No recent illness, otherwise in her usual state of health.  EMS gave her 324 of aspirin and a nitro spray.  She does report that the tightness is "severe".  No arm paresthesia or weakness.  She does have a history of breast cancer status post bilateral mastectomy, considered to be in remission.  She is diabetic has hypertension and hyperlipidemia.  Non-smoker.  Denies any history of DVT or PE.  No recent travel, hospitalization or surgery.      Past Medical History:  Diagnosis Date  . Arthritis   . Cancer of lower-outer quadrant of female breast ([place]) 08/06/2011   RIGHT   . Diabetes mellitus ([place]) 03/24/2012  . High cholesterol   . History of kidney stones   . Hx of bilateral breast implants   . Hypertension   . PONV (postoperative nausea and vomiting)   . Type II diabetes mellitus ([facility])    fasting 140-150  . Vertigo    LAST WEEK  . Vertigo 01/2017     Patient Active Problem List   Diagnosis Date Noted  . Vertigo 03/08/2017  . Adenopathy   . Mediastinal adenopathy 01/25/2017  . Diabetes mellitus ([place]) 03/24/2012  . Carcinoma of lower-outer quadrant of right breast in female, estrogen receptor positive ([place]) 08/06/2011     Past Surgical History:  Procedure Laterality Date  . BREAST BIOPSY  07/2011   right  .  BREAST RECONSTRUCTION  03/07/2012   Procedure: BREAST RECONSTRUCTION;  Surgeon: Crissie Reese, MD;  Location: [place];  Service: Plastics;  Laterality: Left;  BREAST RECONSTRUCTION WITH PLACEMENT OF TISSUE EXPANDER TO LEFT BREAST  . BREAST RECONSTRUCTION  02/06/2013  . CESAREAN SECTION  [id number]  . ENDOBRONCHIAL ULTRASOUND N/A 02/24/2017   Procedure: ENDOBRONCHIAL ULTRASOUND;  Surgeon: [name], MD;  Location: [facility];  Service: Cardiopulmonary;  Laterality: N/A;  . LATISSIMUS FLAP TO BREAST Right 02/06/2013   Procedure: LATISSIMUS FLAP TO RIGHT BREAST WITH IMPLANT;  Surgeon: Crissie Reese, MD;  Location: [place];  Service: Plastics;  Laterality: Right;  . MASTECTOMY  03/07/12   modified right; total left  . MODIFIED MASTECTOMY  03/07/2012   Procedure: MODIFIED MASTECTOMY;  Surgeon: Haywood Lasso, MD;  Location: [place];  Service: General;  Laterality: Right;  . PORTACATH PLACEMENT  07/2011  . SCAR REVISION  03/30/2012   Procedure: SCAR REVISION;  Surgeon: Haywood Lasso, MD;  Location: [place];  Service: General;  Laterality: Right;  CLOSURE OF MASTECTOMY INCISION  . WISDOM TOOTH EXTRACTION       Prior to Admission medications   Medication Sig Start Date End Date Taking? Authorizing Provider  atorvastatin (LIPITOR) 20 MG tablet Take 20 mg by mouth every morning.    [provider]  Calcium Carbonate-Vitamin D (CALCIUM 600 + D PO) Take 1 tablet by mouth 2 (two) times daily.  [provider]  fulvestrant (FASLODEX) 250 MG/5ML injection Inject into the muscle every 30 (thirty) days. One injection each buttock over 1-2 minutes. Warm prior to use.    [provider]  glipiZIDE (GLUCOTROL) 10 MG tablet Take 10 mg by mouth daily before breakfast.    [provider]  levothyroxine (SYNTHROID, LEVOTHROID) 25 MCG tablet TAKE 1 TABLET (25 MCG TOTAL) BY MOUTH DAILY BEFORE BREAKFAST. 01/31/18   [name], MD  lisinopril (PRINIVIL,ZESTRIL)  10 MG tablet Take 10 mg by mouth every morning.     [provider]  loperamide (IMODIUM A-D) 2 MG tablet Take 2 mg by mouth 4 (four) times daily as needed for diarrhea or loose stools.    [provider]  ondansetron (ZOFRAN) 8 MG tablet Take 1 tablet (8 mg total) every 8 (eight) hours as needed by mouth for nausea or vomiting. 07/05/17   [name], MD  pantoprazole (PROTONIX) 40 MG tablet TAKE 1 TABLET (40 MG TOTAL) BY MOUTH DAILY. [place] ON EMPTY STOMACH 06/20/17   [name], MD  prochlorperazine (COMPAZINE) 10 MG tablet Take 1 tablet (10 mg total) by mouth every 6 (six) hours as needed for nausea or vomiting. 05/24/17   [name], MD  Semaglutide (OZEMPIC) 0.25 or 0.5 MG/DOSE SOPN Inject 0.25 mg into the skin once a week.    [provider]  VERZENIO 100 MG tablet TAKE 1 TABLET (100 MG TOTAL) BY MOUTH 2 (TWO) TIMES DAILY. 12/01/17   [name], MD     Allergies Sulfa antibiotics   Family History  Problem Relation Age of Onset  . Cancer Mother        breast    Social History Social History   Tobacco Use  . Smoking status: Never Smoker  . Smokeless tobacco: Never Used  Substance Use Topics  . Alcohol use: No  . Drug use: No    Review of Systems  Constitutional:   No fever or chills.  ENT:   No sore throat. No rhinorrhea. Cardiovascular:   Positive chest tightness as above without syncope. Respiratory:   No dyspnea or cough. Gastrointestinal:   Negative for abdominal pain, vomiting and diarrhea.  Musculoskeletal:   Negative for focal pain or swelling All other systems reviewed and are negative except as documented above in ROS and HPI.  ____________________________________________   PHYSICAL EXAM:  VITAL SIGNS: ED Triage Vitals  Enc Vitals Group     BP 02/04/18 1610 (!) 147/91     Pulse Rate 02/04/18 1610 97     Resp 02/04/18 1610 (!) 22     Temp 02/04/18 1610 98.1 F (36.7 C)      Temp Source 02/04/18 1610 Oral     SpO2 02/04/18 1610 99 %     Weight 02/04/18 1617 183 lb (83 kg)     Height 02/04/18 1617 5\' 2"  (1.575 m)     Head Circumference --      Peak Flow --      Pain Score 02/04/18 1616 0     Pain Loc --      Pain Edu? --      Excl. in [place]? --     Vital signs reviewed, nursing assessments reviewed.   Constitutional:   Alert and oriented. Non-toxic appearance. Eyes:   Conjunctivae are normal. EOMI. PERRL. ENT      Head:   Normocephalic and atraumatic.      Nose:  No congestion/rhinnorhea.       Mouth/Throat:   MMM, no pharyngeal erythema. No peritonsillar mass.       Neck:   No meningismus. Full ROM. Hematological/Lymphatic/Immunilogical:   No cervical lymphadenopathy. Cardiovascular:   RRR. Symmetric bilateral radial and DP pulses.  No murmurs.  Respiratory:   Normal respiratory effort without tachypnea/retractions. Breath sounds are clear and equal bilaterally. No wheezes/rales/rhonchi. Gastrointestinal:   Soft with epigastric and left upper quadrant tenderness, somewhat reproducing her symptoms. Non distended. There is no CVA tenderness.  No rebound, rigidity, or guarding.  Musculoskeletal:   Normal range of motion in all extremities. No joint effusions.  No lower extremity tenderness.  No edema.  Chest wall tender to the touch over the sternum reproducing her symptoms. Neurologic:   Normal speech and language.  Motor grossly intact. No acute focal neurologic deficits are appreciated.  Skin:    Skin is warm, dry and intact. No rash noted.  No petechiae, purpura, or bullae.  ____________________________________________    LABS (pertinent positives/negatives) (all labs ordered are listed, but only abnormal results are displayed) Labs Reviewed  CBC - Abnormal; Notable for the following components:      Result Value   RBC 3.58 (*)    MCH 34.3 (*)    All other components within normal limits  FIBRIN DERIVATIVES D-DIMER ([facility] ONLY) - Abnormal;  Notable for the following components:   Fibrin derivatives D-dimer ([facility]) 749.28 (*)    All other components within normal limits  COMPREHENSIVE METABOLIC PANEL - Abnormal; Notable for the following components:   Sodium 133 (*)    Chloride 97 (*)    Glucose, Bld 215 (*)    BUN 21 (*)    Creatinine, Ser 1.05 (*)    Calcium 10.5 (*)    AST 57 (*)    GFR calc non Af Amer 55 (*)    All other components within normal limits  TROPONIN I  LIPASE, BLOOD  TROPONIN I   ____________________________________________   EKG  Interpreted by me Sinus rhythm rate of 94, normal axis intervals QRS ST segments and T waves.  ____________________________________________    RADIOLOGY  Dg Chest 2 View  Result Date: 02/04/2018 CLINICAL DATA:  Acute onset chest pain and shortness of breath today. Breast carcinoma. EXAM: CHEST - 2 VIEW COMPARISON:  01/29/2013 FINDINGS: The heart size and mediastinal contours are within normal limits. Both lungs are clear. Surgical clips are seen in both axillary regions. The visualized skeletal structures are unremarkable. IMPRESSION: No active cardiopulmonary disease. Electronically Signed   By: Earle Gell M.D.   On: 02/04/2018 17:12   Ct Angio Chest Pe W And/or Wo Contrast  Result Date: 02/04/2018 CLINICAL DATA:  Chest tightness beginning 1 hour ago with anxiety. Tachycardia. EXAM: CT ANGIOGRAPHY CHEST WITH CONTRAST TECHNIQUE: Multidetector CT imaging of the chest was performed using the standard protocol during bolus administration of intravenous contrast. Multiplanar CT image reconstructions and MIPs were obtained to evaluate the vascular anatomy. CONTRAST:  119mL ISOVUE-370 IOPAMIDOL (ISOVUE-370) INJECTION 76% COMPARISON:  09/30/2017, 01/25/2017 and PET-CT 01/31/2017 FINDINGS: Cardiovascular: Heart is normal in size. Thoracic aorta is normal in caliber. Pulmonary arterial system is well opacified without emboli. Calcified plaque over the left lateral circumflex coronary  artery. Mediastinum/Nodes: 1 cm precarinal lymph node without significant change. No other significant mediastinal nodes and no evidence of hilar adenopathy. Remaining mediastinal structures are normal. Lungs/Pleura: Lungs are adequately inflated with subtle stable interstitial prominence over the right apex. No focal airspace  consolidation or effusion. Mild linear atelectasis left base and lingula unchanged. Airways within normal. Upper Abdomen: Mild cholelithiasis. 2 mm stone over the right intrarenal collecting system. Musculoskeletal: Unremarkable.  Bilateral breast implants unchanged. Review of the MIP images confirms the above findings. IMPRESSION: No acute cardiopulmonary disease and no evidence of pulmonary embolism. Stable 1 cm precarinal lymph node likely reactive. Stable minimal chronic interstitial change over the right apex. Minimal atherosclerotic coronary artery disease. Mild cholelithiasis. Suggestion of a single punctate nonobstructing right renal stone. Electronically Signed   By: Marin Olp M.D.   On: 02/04/2018 19:07    ____________________________________________   PROCEDURES .Critical Care Performed by: Carrie Mew, MD Authorized by: Carrie Mew, MD   Critical care provider statement:    Critical care time (minutes):  35   Critical care time was exclusive of:  Separately billable procedures and treating other patients   Critical care was necessary to treat or prevent imminent or life-threatening deterioration of the following conditions:  Cardiac failure   Critical care was time spent personally by me on the following activities:  Development of treatment plan with patient or surrogate, discussions with consultants, evaluation of patient's response to treatment, examination of patient, obtaining history from patient or surrogate, ordering and performing treatments and interventions, ordering and review of laboratory studies, ordering and review of radiographic  studies, pulse oximetry, re-evaluation of patient's condition and review of old charts    ____________________________________________  DIFFERENTIAL DIAGNOSIS   Non-STEMI, pulmonary embolism, GERD, bronchospasm, pneumonia, pneumothorax  CLINICAL IMPRESSION / ASSESSMENT AND PLAN / ED COURSE  Pertinent labs & imaging results that were available during my care of the patient were reviewed by me and considered in my medical decision making (see chart for details).    Patient well-appearing, nontoxic, unremarkable vital signs except for slight tachycardia and slight tachypnea.  With her cancer history, I have a low suspicion currently of PE but I will get a d-dimer for risk stratification.  Will obtain 2 sets of troponins given her risk factors.  If negative I think she is suitable for discharge home and continued antacid therapy as this is most likely GERD and acid reflux.Considering the patient's symptoms, medical history, and physical examination today, I have low suspicion for cholecystitis or biliary pathology, pancreatitis, perforation or bowel obstruction, hernia, intra-abdominal abscess, AAA or dissection, volvulus or intussusception, mesenteric ischemia, or appendicitis.  Low suspicion of dissection.  Not unstable angina.  I will obtain a chest x-ray to evaluate for an occult pneumothorax or pneumonia which is not evident on exam.  Clinical Course as of Feb 04 2234  Sat Feb 04, 2018  1747 D-dimer elevated at 750.  I will order a CT scan of the chest with IV contrast to evaluate for PE.   [PS]  2004 CT angiogram negative for pulmonary embolism or any other significant acute process.  Awaiting second troponin.  Chest pain is still persistent.  Patient is feeling more nauseated again.  I will give IV Zofran, GI cocktail.  If her symptoms fail to improve she will need to be hospitalized for further cardiac work-up.   [PS]    Clinical Course User Index [PS] Carrie Mew, MD      ----------------------------------------- 10:35 PM on 02/04/2018 -----------------------------------------  Persistent chest tightness, no change despite medications.  Troponins negative, CT angiogram negative for PE, overall the work-up is negative, but with her risk factors for CAD, I will treat her for unstable angina and admit for further cardiac work-up.  I have ordered heparin per pharmacy protocol.  ____________________________________________   FINAL CLINICAL IMPRESSION(S) / ED DIAGNOSES    Final diagnoses:  Chest pain, unspecified type  Unstable angina [facility])     ED Discharge Orders    None      Portions of this note were generated with dragon dictation software. Dictation errors may occur despite best attempts at proofreading.    Carrie Mew, MD 02/04/18 2536    Carrie Mew, MD 02/04/18 2249

## 2018-02-05 DIAGNOSIS — R079 Chest pain, unspecified: Secondary | ICD-10-CM | POA: Diagnosis not present

## 2018-02-05 LAB — TROPONIN I
Troponin I: <0.03 ng/mL (ref <0.03)
Troponin I: <0.03 ng/mL (ref <0.03)
Troponin I: <0.03 ng/mL (ref <0.03)

## 2018-02-05 LAB — GLUCOSE, CAPILLARY: Glucose-Capillary: 202 mg/dL — ABNORMAL HIGH (ref 65–99)

## 2018-02-05 MED ORDER — ABEMACICLIB 100 MG PO TABS
100.0000 mg | ORAL_TABLET | Freq: Every day | ORAL | Status: DC
  Administered 2018-02-05: 100 mg via ORAL
  Filled 2018-02-05: qty 1

## 2018-02-05 MED ORDER — LEVOTHYROXINE SODIUM 25 MCG PO TABS
25.0000 ug | ORAL_TABLET | Freq: Every day | ORAL | Status: DC
  Administered 2018-02-05: 25 ug via ORAL
  Filled 2018-02-05: qty 1

## 2018-02-05 MED ORDER — ATORVASTATIN CALCIUM 20 MG PO TABS: 20.0000 mg | ORAL_TABLET | ORAL | Status: DC

## 2018-02-05 MED ORDER — ONDANSETRON HCL 4 MG/2ML IJ SOLN: 4.0000 mg | Freq: Four times a day (QID) | INTRAMUSCULAR | Status: DC | PRN

## 2018-02-05 MED ORDER — PANTOPRAZOLE SODIUM 40 MG PO TBEC: 40.0000 mg | DELAYED_RELEASE_TABLET | Freq: Every day | ORAL | 0 refills | Status: DC

## 2018-02-05 MED ORDER — ACETAMINOPHEN 325 MG PO TABS: 650.0000 mg | ORAL_TABLET | ORAL | Status: DC | PRN

## 2018-02-05 MED ORDER — METOPROLOL TARTRATE 25 MG PO TABS
25.0000 mg | ORAL_TABLET | Freq: Two times a day (BID) | ORAL | Status: DC
  Administered 2018-02-05: 25 mg via ORAL
  Filled 2018-02-05 (×2): qty 1

## 2018-02-05 MED ORDER — PANTOPRAZOLE SODIUM 40 MG PO TBEC
40.0000 mg | DELAYED_RELEASE_TABLET | Freq: Every day | ORAL | Status: DC
  Administered 2018-02-05: 40 mg via ORAL
  Filled 2018-02-05: qty 1

## 2018-02-05 MED ORDER — PROCHLORPERAZINE MALEATE 10 MG PO TABS
10.0000 mg | ORAL_TABLET | Freq: Four times a day (QID) | ORAL | Status: DC | PRN
  Filled 2018-02-05: qty 1

## 2018-02-05 MED ORDER — LISINOPRIL 10 MG PO TABS
10.0000 mg | ORAL_TABLET | ORAL | Status: DC
  Filled 2018-02-05: qty 1

## 2018-02-05 MED ORDER — ASPIRIN 81 MG PO CHEW: 81.0000 mg | CHEWABLE_TABLET | Freq: Every day | ORAL | 0 refills | Status: DC

## 2018-02-05 MED ORDER — ASPIRIN EC 325 MG PO TBEC
325.0000 mg | DELAYED_RELEASE_TABLET | Freq: Every day | ORAL | Status: DC
  Administered 2018-02-05: 325 mg via ORAL
  Filled 2018-02-05: qty 1

## 2018-02-05 NOTE — Progress Notes (Addendum)
Pt BP is at 102/69 and HR of 92. Notify doctor Jodell Cipro and ordered to reschedule lisinopril at 1000 am today. Will continue to monitor.

## 2018-02-05 NOTE — Consult Note (Signed)
Cardiology Consultation Note    Patient ID: Kimberly [name], MRN: [id number], DOB/AGE: Jul 15, 1956 62 y.o. Admit date: 02/04/2018   Date of Consult: 02/05/2018 Primary Physician: Karen Kitchens, MD Primary Cardiologist:    Chief Complaint: chest pain Reason for Consultation: chest pain Requesting MD: Dr. Benjie Karvonen  HPI: Kimberly [name] is a 62 y.o. female with history of breast carcinoma status post bilateral mastectomy, status post neoadjuvant chemotherapy with Cytoxan and Adriamycin in the past with radiation and Herceptin therapy which has completed and is considered to be in remission, hyperlipidemia, hypertension, diabetes who was admitted after presenting to the emergency room with complaints of chest pain.  Tums, aspirin and nitrates did not change the symptoms.  She has ruled out for myocardial infarction.  Patient states that she had diaphoresis and nausea with vomiting with her symptoms.  Electrocardiogram showed sinus rhythm with normal intervals and no ischemia.  Chest x-ray revealed no active cardiopulmonary disease.  Chest CT revealed revealed mild cholelithiasis with possible nonobstructive right renal calculi.  There was no evidence of luminary embolism.  Patient is currently on  enteric-coated aspirin, Lipitor 20 mg daily, lisinopril 10 mg daily, metoprolol tartrate 25 mg twice daily.  She is also on chemotherapy.  He has not had previous cardiac issues.  She remains fairly active.  Her chest tightness is improved.  Past Medical History:  Diagnosis Date  . Arthritis   . Cancer of lower-outer quadrant of female breast ([place]) 08/06/2011   RIGHT   . Diabetes mellitus ([place]) 03/24/2012  . High cholesterol   . History of kidney stones   . Hx of bilateral breast implants   . Hypertension   . PONV (postoperative nausea and vomiting)   . Type II diabetes mellitus ([facility])    fasting 140-150  . Vertigo    LAST WEEK  . Vertigo 01/2017      Surgical History:  Past Surgical  History:  Procedure Laterality Date  . BREAST BIOPSY  07/2011   right  . BREAST RECONSTRUCTION  03/07/2012   Procedure: BREAST RECONSTRUCTION;  Surgeon: Crissie Reese, MD;  Location: [facility];  Service: Plastics;  Laterality: Left;  BREAST RECONSTRUCTION WITH PLACEMENT OF TISSUE EXPANDER TO LEFT BREAST  . BREAST RECONSTRUCTION  02/06/2013  . CESAREAN SECTION  [id number]  . ENDOBRONCHIAL ULTRASOUND N/A 02/24/2017   Procedure: ENDOBRONCHIAL ULTRASOUND;  Surgeon: [name], MD;  Location: [facility];  Service: Cardiopulmonary;  Laterality: N/A;  . LATISSIMUS FLAP TO BREAST Right 02/06/2013   Procedure: LATISSIMUS FLAP TO RIGHT BREAST WITH IMPLANT;  Surgeon: Crissie Reese, MD;  Location: [place];  Service: Plastics;  Laterality: Right;  . MASTECTOMY  03/07/12   modified right; total left  . MODIFIED MASTECTOMY  03/07/2012   Procedure: MODIFIED MASTECTOMY;  Surgeon: Haywood Lasso, MD;  Location: [place];  Service: General;  Laterality: Right;  . PORTACATH PLACEMENT  07/2011  . SCAR REVISION  03/30/2012   Procedure: SCAR REVISION;  Surgeon: Haywood Lasso, MD;  Location: [place];  Service: General;  Laterality: Right;  CLOSURE OF MASTECTOMY INCISION  . WISDOM TOOTH EXTRACTION       Home Meds: Prior to Admission medications   Medication Sig Start Date End Date Taking? Authorizing Provider  atorvastatin (LIPITOR) 20 MG tablet Take 20 mg by mouth every morning.   Yes [provider]  Calcium Carbonate-Vitamin D (CALCIUM 600 + D PO) Take 1 tablet by mouth 2 (two) times daily.   Yes  [provider]  fulvestrant (FASLODEX) 250 MG/5ML injection Inject into the muscle every 30 (thirty) days. One injection each buttock over 1-2 minutes. Warm prior to use.   Yes [provider]  glipiZIDE (GLUCOTROL) 10 MG tablet Take 10 mg by mouth daily before breakfast.   Yes [provider]  levothyroxine (SYNTHROID, LEVOTHROID) 25 MCG tablet TAKE 1 TABLET (25 MCG TOTAL)  BY MOUTH DAILY BEFORE BREAKFAST. 01/31/18  Yes [name], MD  lisinopril (PRINIVIL,ZESTRIL) 10 MG tablet Take 10 mg by mouth every morning.    Yes [provider]  ondansetron (ZOFRAN) 8 MG tablet Take 1 tablet (8 mg total) every 8 (eight) hours as needed by mouth for nausea or vomiting. 07/05/17  Yes [name], MD  Semaglutide (OZEMPIC) 0.25 or 0.5 MG/DOSE SOPN Inject 0.25 mg into the skin once a week.   Yes [provider]  VERZENIO 100 MG tablet TAKE 1 TABLET (100 MG TOTAL) BY MOUTH 2 (TWO) TIMES DAILY. 12/01/17  Yes [name], MD  loperamide (IMODIUM A-D) 2 MG tablet Take 2 mg by mouth 4 (four) times daily as needed for diarrhea or loose stools.    [provider]  pantoprazole (PROTONIX) 40 MG tablet TAKE 1 TABLET (40 MG TOTAL) BY MOUTH DAILY. [place] ON EMPTY STOMACH Patient not taking: Reported on 02/05/2018 06/20/17   [name], MD  prochlorperazine (COMPAZINE) 10 MG tablet Take 1 tablet (10 mg total) by mouth every 6 (six) hours as needed for nausea or vomiting. Patient not taking: Reported on 02/05/2018 05/24/17   [name], MD    Inpatient Medications:  . abemaciclib  100 mg Oral Daily  . aspirin EC  325 mg Oral Daily  . atorvastatin  20 mg Oral BH-q7a  . levothyroxine  25 mcg Oral QAC breakfast  . lisinopril  10 mg Oral BH-q7a  . metoprolol tartrate  25 mg Oral BID  . pantoprazole  40 mg Oral Daily     Allergies:  Allergies  Allergen Reactions  . Sulfa Antibiotics Rash    Social History   Socioeconomic History  . Marital status: Married    Spouse name: Not on file  . Number of children: Not on file  . Years of education: Not on file  . Highest education level: Not on file  Occupational History  . Not on file  Social Needs  . Financial resource strain: Not on file  . Food insecurity:    Worry: Not on file    Inability: Not on file  . Transportation needs:    Medical: Not  on file    Non-medical: Not on file  Tobacco Use  . Smoking status: Never Smoker  . Smokeless tobacco: Never Used  Substance and Sexual Activity  . Alcohol use: No  . Drug use: No  . Sexual activity: Yes  Lifestyle  . Physical activity:    Days per week: Not on file    Minutes per session: Not on file  . Stress: Not on file  Relationships  . Social connections:    Talks on phone: Not on file    Gets together: Not on file    Attends religious service: Not on file    Active member of club or organization: Not on file    Attends meetings of clubs or organizations: Not on file    Relationship status: Not on file  . Intimate partner violence:    Fear of  current or ex partner: Not on file    Emotionally abused: Not on file    Physically abused: Not on file    Forced sexual activity: Not on file  Other Topics Concern  . Not on file  Social History Narrative  . Not on file     Family History  Problem Relation Age of Onset  . Cancer Mother        breast     Review of Systems: A 12-system review of systems was performed and is negative except as noted in the HPI.  Labs: Recent Labs    02/04/18 1619 02/04/18 2002 02/05/18 0324 02/05/18 0538  TROPONINI <0.03 <0.03 <0.03 <0.03   Lab Results  Component Value Date   WBC 4.5 02/04/2018   HGB 12.3 02/04/2018   HCT 35.1 02/04/2018   MCV 98.0 02/04/2018   PLT 306 02/04/2018    Recent Labs  Lab 02/04/18 1619  NA 133*  K 4.5  CL 97*  CO2 23  BUN 21*  CREATININE 1.05*  CALCIUM 10.5*  PROT 7.6  BILITOT 0.7  ALKPHOS 51  ALT 27  AST 57*  GLUCOSE 215*   No results found for: CHOL, HDL, LDLCALC, TRIG No results found for: DDIMER  Radiology/Studies:  Dg Chest 2 View  Result Date: 02/04/2018 CLINICAL DATA:  Acute onset chest pain and shortness of breath today. Breast carcinoma. EXAM: CHEST - 2 VIEW COMPARISON:  01/29/2013 FINDINGS: The heart size and mediastinal contours are within normal limits. Both lungs are  clear. Surgical clips are seen in both axillary regions. The visualized skeletal structures are unremarkable. IMPRESSION: No active cardiopulmonary disease. Electronically Signed   By: Earle Gell M.D.   On: 02/04/2018 17:12   Ct Angio Chest Pe W And/or Wo Contrast  Result Date: 02/04/2018 CLINICAL DATA:  Chest tightness beginning 1 hour ago with anxiety. Tachycardia. EXAM: CT ANGIOGRAPHY CHEST WITH CONTRAST TECHNIQUE: Multidetector CT imaging of the chest was performed using the standard protocol during bolus administration of intravenous contrast. Multiplanar CT image reconstructions and MIPs were obtained to evaluate the vascular anatomy. CONTRAST:  119mL ISOVUE-370 IOPAMIDOL (ISOVUE-370) INJECTION 76% COMPARISON:  09/30/2017, 01/25/2017 and PET-CT 01/31/2017 FINDINGS: Cardiovascular: Heart is normal in size. Thoracic aorta is normal in caliber. Pulmonary arterial system is well opacified without emboli. Calcified plaque over the left lateral circumflex coronary artery. Mediastinum/Nodes: 1 cm precarinal lymph node without significant change. No other significant mediastinal nodes and no evidence of hilar adenopathy. Remaining mediastinal structures are normal. Lungs/Pleura: Lungs are adequately inflated with subtle stable interstitial prominence over the right apex. No focal airspace consolidation or effusion. Mild linear atelectasis left base and lingula unchanged. Airways within normal. Upper Abdomen: Mild cholelithiasis. 2 mm stone over the right intrarenal collecting system. Musculoskeletal: Unremarkable.  Bilateral breast implants unchanged. Review of the MIP images confirms the above findings. IMPRESSION: No acute cardiopulmonary disease and no evidence of pulmonary embolism. Stable 1 cm precarinal lymph node likely reactive. Stable minimal chronic interstitial change over the right apex. Minimal atherosclerotic coronary artery disease. Mild cholelithiasis. Suggestion of a single punctate nonobstructing  right renal stone. Electronically Signed   By: Marin Olp M.D.   On: 02/04/2018 19:07    Wt Readings from Last 3 Encounters:  02/05/18 79.6 kg (175 lb 8 oz)  01/31/18 83 kg (183 lb)  01/03/18 83.1 kg (183 lb 3.2 oz)    EKG: Normal sinus rhythm with no ischemia  Physical Exam:  Blood pressure 98/65, pulse 90, temperature  98.9 F (37.2 C), temperature source Oral, resp. rate 14, height 5\' 2"  (1.575 m), weight 79.6 kg (175 lb 8 oz), SpO2 97 %. Body mass index is 32.1 kg/m. General: Well developed, well nourished, in no acute distress. Head: Normocephalic, atraumatic, sclera non-icteric, no xanthomas, nares are without discharge.  Neck: Negative for carotid bruits. JVD not elevated. Lungs: Clear bilaterally to auscultation without wheezes, rales, or rhonchi. Breathing is unlabored. Heart: RRR with S1 S2. No murmurs, rubs, or gallops appreciated. Abdomen: Soft, non-tender, non-distended with normoactive bowel sounds. No hepatomegaly. No rebound/guarding. No obvious abdominal masses. Msk:  Strength and tone appear normal for age. Extremities: No clubbing or cyanosis. No edema.  Distal pedal pulses are 2+ and equal bilaterally. Neuro: Alert and oriented X 3. No facial asymmetry. No focal deficit. Moves all extremities spontaneously. Psych:  Responds to questions appropriately with a normal affect.     Assessment and Plan  62 year old female with history of breast carcinoma status post bilateral mastectomies, radiation therapy, chemotherapy who was admitted after presenting to emergency room with an episode of nausea vomiting chest tightness with diaphoresis.  She is ruled out for myocardial infarction.  Chest x-ray and electrocardiogram are unremarkable.  Chest CT did reveal cholelithiasis.  Liver functions were not significantly elevated.  Etiology of pain is unclear.  Certainly could have been transient gallbladder lead obstruction however liver enzymes in the markedly elevated.  Cardiac  enzymes are not elevated either.  EKG was unremarkable.  Long discussion with patient and her husband.  Would ambulate this morning.  If there is still symptoms, will proceed with a functional study in the morning as an inpatient.  Of telemetry stable, this could be considered to be done as an outpatient  Signed, Teodoro Spray MD 02/05/2018, 9:44 AM Pager: [phone]

## 2018-02-05 NOTE — Discharge Summary (Signed)
[facility] [facility] [facility] [place] NAME: Nila Winker    MR#:  [id number]  DATE OF BIRTH:  [birth year]  DATE OF ADMISSION:  02/04/2018 ADMITTING PHYSICIAN: Amelia Jo, MD  DATE OF DISCHARGE: 02/05/2018  PRIMARY CARE PHYSICIAN: Karen Kitchens, MD    ADMISSION DIAGNOSIS:  Unstable angina ([place]) [I20.0] Chest pain, unspecified type [R07.9]  DISCHARGE DIAGNOSIS:  Active Problems:   Chest pain   SECONDARY DIAGNOSIS:   Past Medical History:  Diagnosis Date  . Arthritis   . Cancer of lower-outer quadrant of female breast ([place]) 08/06/2011   RIGHT   . Diabetes mellitus ([place]) 03/24/2012  . High cholesterol   . History of kidney stones   . Hx of bilateral breast implants   . Hypertension   . PONV (postoperative nausea and vomiting)   . Type II diabetes mellitus ([facility])    fasting 140-150  . Vertigo    LAST WEEK  . Vertigo 01/2017    HOSPITAL COURSE:  62 year old female with history of breast cancer status post bilateral mastectomy who presents with chest pressure.  1.  Chest pressure: Patient symptoms have resolved.  Chest CT showed no evidence of pulmonary emboli.  EKG was essentially unremarkable.  Troponins were negative and she has ruled out for ACS.  She was evaluated by cardiology.  Plan is for outpatient follow-up with cardiology for possible stress test.   2.  Stage IV recurrent metastatic breast cancer: Follow-up with oncology  3.  Essential hypertension: Continue lisinopril  4.  Chronic kidney disease stage II:  5.  Hyperlipidemia: Continue atorvastatin  6.  Diabetes: Continue ADA diet with glipizide  7.  Hypothyroidism: Continue Synthroid  DISCHARGE CONDITIONS AND DIET:   Stable for discharge on heart healthy diet/diabetic diet  CONSULTS OBTAINED:  Treatment Team:  Teodoro Spray, MD  DRUG ALLERGIES:   Allergies  Allergen Reactions  . Sulfa Antibiotics Rash    DISCHARGE MEDICATIONS:   Allergies as of 02/05/2018       Reactions   Sulfa Antibiotics Rash      Medication List    TAKE these medications   aspirin 81 MG chewable tablet Commonly known as:  ASPIRIN CHILDRENS Chew 1 tablet (81 mg total) by mouth daily.   atorvastatin 20 MG tablet Commonly known as:  LIPITOR Take 20 mg by mouth every morning.   CALCIUM 600 + D PO Take 1 tablet by mouth 2 (two) times daily.   fulvestrant 250 MG/5ML injection Commonly known as:  FASLODEX Inject into the muscle every 30 (thirty) days. One injection each buttock over 1-2 minutes. Warm prior to use.   glipiZIDE 10 MG tablet Commonly known as:  GLUCOTROL Take 10 mg by mouth daily before breakfast.   levothyroxine 25 MCG tablet Commonly known as:  SYNTHROID, LEVOTHROID TAKE 1 TABLET (25 MCG TOTAL) BY MOUTH DAILY BEFORE BREAKFAST.   lisinopril 10 MG tablet Commonly known as:  PRINIVIL,ZESTRIL Take 10 mg by mouth every morning.   loperamide 2 MG tablet Commonly known as:  IMODIUM A-D Take 2 mg by mouth 4 (four) times daily as needed for diarrhea or loose stools.   ondansetron 8 MG tablet Commonly known as:  ZOFRAN Take 1 tablet (8 mg total) every 8 (eight) hours as needed by mouth for nausea or vomiting.   OZEMPIC 0.25 or 0.5 MG/DOSE Sopn Generic drug:  Semaglutide Inject 0.25 mg into the skin once a week.   pantoprazole 40 MG tablet Commonly known as:  PROTONIX Take 1 tablet (40 mg total) by mouth daily. 30 mins prior to meals; On empty stomach   prochlorperazine 10 MG tablet Commonly known as:  COMPAZINE Take 1 tablet (10 mg total) by mouth every 6 (six) hours as needed for nausea or vomiting.   VERZENIO 100 MG tablet Generic drug:  abemaciclib TAKE 1 TABLET (100 MG TOTAL) BY MOUTH 2 (TWO) TIMES DAILY.         Today   CHIEF COMPLAINT:  No chest pain/pressure overnight   VITAL SIGNS:  Blood pressure 98/65, pulse 90, temperature 98.9 F (37.2 C), temperature source Oral, resp. rate 14, height 5\' 2"  (1.575 m), weight 79.6 kg  (175 lb 8 oz), SpO2 97 %.   REVIEW OF SYSTEMS:  Review of Systems  Constitutional: Negative.  Negative for chills, fever and malaise/fatigue.  HENT: Negative.  Negative for ear discharge, ear pain, hearing loss, nosebleeds and sore throat.   Eyes: Negative.  Negative for blurred vision and pain.  Respiratory: Negative.  Negative for cough, hemoptysis, shortness of breath and wheezing.   Cardiovascular: Negative.  Negative for chest pain, palpitations and leg swelling.  Gastrointestinal: Negative.  Negative for abdominal pain, blood in stool, diarrhea, nausea and vomiting.  Genitourinary: Negative.  Negative for dysuria.  Musculoskeletal: Negative.  Negative for back pain.  Skin: Negative.   Neurological: Negative for dizziness, tremors, speech change, focal weakness, seizures and headaches.  Endo/Heme/Allergies: Negative.  Does not bruise/bleed easily.  Psychiatric/Behavioral: Negative.  Negative for depression, hallucinations and suicidal ideas.     PHYSICAL EXAMINATION:  GENERAL:  62 y.o.-year-old patient lying in the bed with no acute distress.  NECK:  Supple, no jugular venous distention. No thyroid enlargement, no tenderness.  LUNGS: Normal breath sounds bilaterally, no wheezing, rales,rhonchi  No use of accessory muscles of respiration.  CARDIOVASCULAR: S1, S2 normal. No murmurs, rubs, or gallops.  ABDOMEN: Soft, non-tender, non-distended. Bowel sounds present. No organomegaly or mass.  EXTREMITIES: No pedal edema, cyanosis, or clubbing.  PSYCHIATRIC: The patient is alert and oriented x 3.  SKIN: No obvious rash, lesion, or ulcer.   DATA REVIEW:   CBC Recent Labs  Lab 02/04/18 1619  WBC 4.5  HGB 12.3  HCT 35.1  PLT 306    Chemistries  Recent Labs  Lab 02/04/18 1619  NA 133*  K 4.5  CL 97*  CO2 23  GLUCOSE 215*  BUN 21*  CREATININE 1.05*  CALCIUM 10.5*  AST 57*  ALT 27  ALKPHOS 51  BILITOT 0.7    Cardiac Enzymes Recent Labs  Lab 02/05/18 0324  02/05/18 0538 02/05/18 0936  TROPONINI <0.03 <0.03 <0.03    Microbiology Results  @[email or web address] @  RADIOLOGY:  Dg Chest 2 View  Result Date: 02/04/2018 CLINICAL DATA:  Acute onset chest pain and shortness of breath today. Breast carcinoma. EXAM: CHEST - 2 VIEW COMPARISON:  01/29/2013 FINDINGS: The heart size and mediastinal contours are within normal limits. Both lungs are clear. Surgical clips are seen in both axillary regions. The visualized skeletal structures are unremarkable. IMPRESSION: No active cardiopulmonary disease. Electronically Signed   By: Earle Gell M.D.   On: 02/04/2018 17:12   Ct Angio Chest Pe W And/or Wo Contrast  Result Date: 02/04/2018 CLINICAL DATA:  Chest tightness beginning 1 hour ago with anxiety. Tachycardia. EXAM: CT ANGIOGRAPHY CHEST WITH CONTRAST TECHNIQUE: Multidetector CT imaging of the chest was performed using the standard protocol during bolus administration of intravenous contrast. Multiplanar CT image reconstructions and MIPs were obtained  to evaluate the vascular anatomy. CONTRAST:  129mL ISOVUE-370 IOPAMIDOL (ISOVUE-370) INJECTION 76% COMPARISON:  09/30/2017, 01/25/2017 and PET-CT 01/31/2017 FINDINGS: Cardiovascular: Heart is normal in size. Thoracic aorta is normal in caliber. Pulmonary arterial system is well opacified without emboli. Calcified plaque over the left lateral circumflex coronary artery. Mediastinum/Nodes: 1 cm precarinal lymph node without significant change. No other significant mediastinal nodes and no evidence of hilar adenopathy. Remaining mediastinal structures are normal. Lungs/Pleura: Lungs are adequately inflated with subtle stable interstitial prominence over the right apex. No focal airspace consolidation or effusion. Mild linear atelectasis left base and lingula unchanged. Airways within normal. Upper Abdomen: Mild cholelithiasis. 2 mm stone over the right intrarenal collecting system. Musculoskeletal: Unremarkable.  Bilateral breast  implants unchanged. Review of the MIP images confirms the above findings. IMPRESSION: No acute cardiopulmonary disease and no evidence of pulmonary embolism. Stable 1 cm precarinal lymph node likely reactive. Stable minimal chronic interstitial change over the right apex. Minimal atherosclerotic coronary artery disease. Mild cholelithiasis. Suggestion of a single punctate nonobstructing right renal stone. Electronically Signed   By: Marin Olp M.D.   On: 02/04/2018 19:07      Allergies as of 02/05/2018      Reactions   Sulfa Antibiotics Rash      Medication List    TAKE these medications   aspirin 81 MG chewable tablet Commonly known as:  ASPIRIN CHILDRENS Chew 1 tablet (81 mg total) by mouth daily.   atorvastatin 20 MG tablet Commonly known as:  LIPITOR Take 20 mg by mouth every morning.   CALCIUM 600 + D PO Take 1 tablet by mouth 2 (two) times daily.   fulvestrant 250 MG/5ML injection Commonly known as:  FASLODEX Inject into the muscle every 30 (thirty) days. One injection each buttock over 1-2 minutes. Warm prior to use.   glipiZIDE 10 MG tablet Commonly known as:  GLUCOTROL Take 10 mg by mouth daily before breakfast.   levothyroxine 25 MCG tablet Commonly known as:  SYNTHROID, LEVOTHROID TAKE 1 TABLET (25 MCG TOTAL) BY MOUTH DAILY BEFORE BREAKFAST.   lisinopril 10 MG tablet Commonly known as:  PRINIVIL,ZESTRIL Take 10 mg by mouth every morning.   loperamide 2 MG tablet Commonly known as:  IMODIUM A-D Take 2 mg by mouth 4 (four) times daily as needed for diarrhea or loose stools.   ondansetron 8 MG tablet Commonly known as:  ZOFRAN Take 1 tablet (8 mg total) every 8 (eight) hours as needed by mouth for nausea or vomiting.   OZEMPIC 0.25 or 0.5 MG/DOSE Sopn Generic drug:  Semaglutide Inject 0.25 mg into the skin once a week.   pantoprazole 40 MG tablet Commonly known as:  PROTONIX Take 1 tablet (40 mg total) by mouth daily. 30 mins prior to meals; On empty  stomach   prochlorperazine 10 MG tablet Commonly known as:  COMPAZINE Take 1 tablet (10 mg total) by mouth every 6 (six) hours as needed for nausea or vomiting.   VERZENIO 100 MG tablet Generic drug:  abemaciclib TAKE 1 TABLET (100 MG TOTAL) BY MOUTH 2 (TWO) TIMES DAILY.          Management plans discussed with the patient and she is in agreement. Stable for discharge home  Patient should follow up with cardiology tomorrow  CODE STATUS:     Code Status Orders  (From admission, onward)        Start     Ordered   02/05/18 0103  Full code  Continuous  02/05/18 0102    Code Status History    This patient has a current code status but no historical code status.      TOTAL TIME TAKING CARE OF THIS PATIENT: 38 minutes.    Note: This dictation was prepared with Dragon dictation along with smaller phrase technology. Any transcriptional errors that result from this process are unintentional.  Wilene Pharo M.D on 02/05/2018 at 10:16 AM  Between 7am to 6pm - Pager - [phone] After 6pm go to [email or web address] - password [email or web address]  Office  [phone]  CC: Primary care physician; Karen Kitchens, MD

## 2018-02-05 NOTE — Progress Notes (Signed)
Discharged to home with husband.  Will follow up with Dr. Ubaldo Glassing tomorrow.

## 2018-02-05 NOTE — Progress Notes (Signed)
ANTICOAGULATION CONSULT NOTE - Initial Consult  Pharmacy Consult for heparin Indication: chest pain/ACS  Allergies  Allergen Reactions  . Sulfa Antibiotics Rash    Patient Measurements: Height: 5\' 2"  (157.5 cm) Weight: 183 lb (83 kg) IBW/kg (Calculated) : 50.1 Heparin Dosing Weight: 69 kg  Vital Signs: Temp: 98.1 F (36.7 C) (06/08 1610) Temp Source: Oral (06/08 1610) BP: 139/76 (06/08 2154) Pulse Rate: 115 (06/08 2154)  Labs: Recent Labs    02/04/18 1619 02/04/18 2002 02/04/18 2302  HGB 12.3  --   --   HCT 35.1  --   --   PLT 306  --   --   APTT  --   --  <24*  LABPROT  --   --  13.9  INR  --   --  1.08  CREATININE 1.05*  --   --   TROPONINI <0.03 <0.03  --     Estimated Creatinine Clearance: 54.8 mL/min (A) (by C-G formula based on SCr of 1.05 mg/dL (H)).   Medical History: Past Medical History:  Diagnosis Date  . Arthritis   . Cancer of lower-outer quadrant of female breast ([place]) 08/06/2011   RIGHT   . Diabetes mellitus ([place]) 03/24/2012  . High cholesterol   . History of kidney stones   . Hx of bilateral breast implants   . Hypertension   . PONV (postoperative nausea and vomiting)   . Type II diabetes mellitus ([facility])    fasting 140-150  . Vertigo    LAST WEEK  . Vertigo 01/2017    Medications:  Scheduled:    Assessment: Patient w/ a h/o breast CA here for chest tightness that feels like heartburn. Patient took tums w/o relief. Was given ASA 325 and nitro spray via EMS not sure if patient had relief with this. Considering this UA w/ a TIMI score of 3 patient may not warrant full-dose anticoagulation. Patient is not on any anticoagulation PTA [place] negative x 2 EKG sinus rhythm  Goal of Therapy:  Heparin level 0.3-0.7 units/ml Monitor platelets by anticoagulation protocol: Yes   Plan:  Upon reviewing patient's status; appears to be non-exertional UA Spoke to MD and would like to discontinue heparin and just observe  Tobie Lords, PharmD,  BCPS Clinical Pharmacist 02/05/2018

## 2018-02-05 NOTE — Plan of Care (Signed)
  Problem: Education: Goal: Knowledge of General Education information will improve Outcome: Progressing   Problem: Health Behavior/Discharge Planning: Goal: Ability to manage health-related needs will improve Outcome: Progressing   Problem: Pain Managment: Goal: General experience of comfort will improve Outcome: Progressing   

## 2018-02-06 LAB — HIV ANTIBODY (ROUTINE TESTING W REFLEX): HIV SCREEN 4TH GENERATION: NONREACTIVE

## 2018-02-06 MED FILL — VERZENIO 100 MG TAB: 100 | 28 days supply | Qty: 56 | Fill #2

## 2018-02-22 ENCOUNTER — Ambulatory Visit
Admission: RE | Admit: 2018-02-22 | Discharge: 2018-02-22 | Disposition: A | Discharge status: Home / Self Care | Payer: BLUE CROSS/BLUE SHIELD | Source: Ambulatory Visit | Attending: Internal Medicine | Admitting: Internal Medicine

## 2018-02-22 DIAGNOSIS — C50511 Malignant neoplasm of lower-outer quadrant of right female breast: Secondary | ICD-10-CM | POA: Insufficient documentation

## 2018-02-22 DIAGNOSIS — Z170 Estrogen receptor positive status [ER+]: Secondary | ICD-10-CM | POA: Insufficient documentation

## 2018-02-22 LAB — POCT I-STAT CREATININE: Creatinine, Ser: 1.3 mg/dL — ABNORMAL HIGH (ref 0.44–1.00)

## 2018-02-22 MED ORDER — IOPAMIDOL (ISOVUE-300) INJECTION 61%
80.0000 mL | Freq: Once | INTRAVENOUS | Status: AC | PRN
  Administered 2018-02-22: 80 mL via INTRAVENOUS

## 2018-02-24 ENCOUNTER — Telehealth: Payer: Self-pay | Admitting: *Deleted

## 2018-02-24 ENCOUNTER — Telehealth: Payer: Self-pay | Admitting: Internal Medicine

## 2018-02-24 ENCOUNTER — Ambulatory Visit: Payer: BLUE CROSS/BLUE SHIELD

## 2018-02-24 NOTE — Telephone Encounter (Signed)
Kimberly [name]/brooke- Please inform pt that CT scan chest- [done ine ER] shows one small lymph node in between 2 lung [which we she had; NOT new/ not any worse]; CT a/p-negative for any cancer. No new recommendations/ follow up as planned Thx  GB

## 2018-02-24 NOTE — Telephone Encounter (Signed)
Spoke with patient regarding ct scan results.

## 2018-02-24 NOTE — Telephone Encounter (Signed)
I left message for patient to return phone call.   

## 2018-02-24 NOTE — Telephone Encounter (Signed)
Spoke with patient regarding her ct scan results.

## 2018-03-09 ENCOUNTER — Inpatient Hospital Stay (HOSPITAL_BASED_OUTPATIENT_CLINIC_OR_DEPARTMENT_OTHER): Payer: BLUE CROSS/BLUE SHIELD | Admitting: Internal Medicine

## 2018-03-09 ENCOUNTER — Other Ambulatory Visit: Payer: Self-pay

## 2018-03-09 ENCOUNTER — Encounter: Payer: Self-pay | Admitting: Internal Medicine

## 2018-03-09 ENCOUNTER — Inpatient Hospital Stay: Payer: BLUE CROSS/BLUE SHIELD | Attending: Internal Medicine

## 2018-03-09 ENCOUNTER — Encounter (INDEPENDENT_AMBULATORY_CARE_PROVIDER_SITE_OTHER): Payer: Self-pay

## 2018-03-09 ENCOUNTER — Inpatient Hospital Stay: Payer: BLUE CROSS/BLUE SHIELD

## 2018-03-09 VITALS — BP 125/85 | HR 91 | Temp 97.9°F | Resp 20 | Ht 62.0 in | Wt 175.0 lb

## 2018-03-09 DIAGNOSIS — Z170 Estrogen receptor positive status [ER+]: Principal | ICD-10-CM

## 2018-03-09 DIAGNOSIS — N182 Chronic kidney disease, stage 2 (mild): Secondary | ICD-10-CM

## 2018-03-09 DIAGNOSIS — R197 Diarrhea, unspecified: Secondary | ICD-10-CM | POA: Diagnosis not present

## 2018-03-09 DIAGNOSIS — C50511 Malignant neoplasm of lower-outer quadrant of right female breast: Secondary | ICD-10-CM

## 2018-03-09 DIAGNOSIS — Z5111 Encounter for antineoplastic chemotherapy: Secondary | ICD-10-CM | POA: Insufficient documentation

## 2018-03-09 DIAGNOSIS — R599 Enlarged lymph nodes, unspecified: Secondary | ICD-10-CM | POA: Insufficient documentation

## 2018-03-09 LAB — CBC WITH DIFFERENTIAL/PLATELET
Basophils Absolute: 0.1 K/uL (ref 0–0.1)
Basophils Relative: 3 %
Eosinophils Absolute: 0.1 K/uL (ref 0–0.7)
Eosinophils Relative: 2 %
HCT: 31.4 % — ABNORMAL LOW (ref 35.0–47.0)
Hemoglobin: 11 g/dL — ABNORMAL LOW (ref 12.0–16.0)
Lymphocytes Relative: 43 %
Lymphs Abs: 1.3 K/uL (ref 1.0–3.6)
MCH: 34.5 pg — ABNORMAL HIGH (ref 26.0–34.0)
MCHC: 35 g/dL (ref 32.0–36.0)
MCV: 98.6 fL (ref 80.0–100.0)
Monocytes Absolute: 0.2 K/uL (ref 0.2–0.9)
Monocytes Relative: 6 %
Neutro Abs: 1.4 K/uL (ref 1.4–6.5)
Neutrophils Relative %: 46 %
Platelets: 266 K/uL (ref 150–440)
RBC: 3.18 MIL/uL — ABNORMAL LOW (ref 3.80–5.20)
RDW: 13.8 % (ref 11.5–14.5)
WBC: 3 K/uL — ABNORMAL LOW (ref 3.6–11.0)

## 2018-03-09 LAB — COMPREHENSIVE METABOLIC PANEL WITH GFR
ALT: 27 U/L (ref 0–44)
AST: 40 U/L (ref 15–41)
Albumin: 3.9 g/dL (ref 3.5–5.0)
Alkaline Phosphatase: 45 U/L (ref 38–126)
Anion gap: 10 (ref 5–15)
BUN: 18 mg/dL (ref 8–23)
CO2: 23 mmol/L (ref 22–32)
Calcium: 10 mg/dL (ref 8.9–10.3)
Chloride: 103 mmol/L (ref 98–111)
Creatinine, Ser: 1.35 mg/dL — ABNORMAL HIGH (ref 0.44–1.00)
GFR calc Af Amer: 47 mL/min — ABNORMAL LOW
GFR calc non Af Amer: 41 mL/min — ABNORMAL LOW
Glucose, Bld: 184 mg/dL — ABNORMAL HIGH (ref 70–99)
Potassium: 4.6 mmol/L (ref 3.5–5.1)
Sodium: 136 mmol/L (ref 135–145)
Total Bilirubin: 0.5 mg/dL (ref 0.3–1.2)
Total Protein: 7.1 g/dL (ref 6.5–8.1)

## 2018-03-09 MED ORDER — FULVESTRANT 250 MG/5ML IM SOLN
500.0000 mg | Freq: Once | INTRAMUSCULAR | Status: AC
  Administered 2018-03-09: 500 mg via INTRAMUSCULAR
  Filled 2018-03-09: qty 10

## 2018-03-09 NOTE — Progress Notes (Signed)
[facility] OFFICE PROGRESS NOTE  Patient Care Team: Karen Kitchens, MD as PCP - General (Family Medicine) Neldon Mc, MD (General Surgery) Everlene Farrier, MD (Obstetrics and Gynecology) Noreene Filbert, MD Forest Gleason, MD (Inactive) (Unknown Physician Specialty)  Cancer Staging Carcinoma of lower-outer quadrant of right breast in female, estrogen receptor positive [facility]) Staging form: Breast, AJCC 7th Edition - Pathologic: ypT1c,ypN2a, MX - Signed by Haywood Lasso, MD on 03/10/2012    Oncology History   # DEC 2012- RIGHT BREAST CA T2 N2 M0 tumor from biopsy.  Estrogen receptor positive, Progesterone receptor positive.  Current receptor negative by FISH 2. Neoadjuvant chemotherapy started in December of 28 with Cytoxan Adriamycin 3. Started on Taxol weekly chemotherapy. 4. Patient finished 12  cycles of Taxol chemotherapy in May of 2013.     5. Status post right modified radical mastectomy [Dr.Bowers; [place]] June of 2013, ypT1c  yp N2  MO. started also on Lerazole    7. Radiation therapy to the right breast (September of 2013).  Lymph node was positive for HER-2 receptor gene amplification of 2.22.  Will proceed with Herceptin treatment starting in September of 2013.   8.Patient has finished Herceptin (maintenance therapy) in August of 2014 8. Start patient on letrozole from November, 2013. 9. Patient started on Herceptin in September 2013.   10Patient finished 12 months of Herceptin therapy on August, 2014  # 6. Status post left side prophylactic mastectomy.  #Late MAY 2018-RECURRENCE BREAST CA- ER positive/PR negative; ?? HER-2/neu- [biopsy- proven-mediastinal lymph node; in suff for her 2 testing].  [elevated Tumor marker- CT/PET- uptake in Right Mediastinal LN; Sternum [June 2018 [facility]- Dr.Kasa]   # March 10 2017- faslodex + Abema; OCT 5th CT-PR of mediastinal LN --------------------------------------------    DIAGNOSIS: [ BREAST CANCER-  ER/PR/HER2 NEU POS  STAGE:  4   ;GOALS: PALLIATIVE  CURRENT/MOST RECENT THERAPY - ABEMA + FASLODEX      Carcinoma of lower-outer quadrant of right breast in female, estrogen receptor positive ([place])      INTERVAL HISTORY:  Kimberly [name] 62 y.o.  female pleasant patient above history of metastatic breast cancer currently on Faslodex plus abema is here for follow-up/reviewed the results of her restaging scans.  Patient had episode of intense chest pain that led to evaluation in the emergency room with a CTA that was negative for PE.  That showed a 10 mm stable precarinal lymph node.  Patient had further work-up including stress test that was negative.  Denies any nausea vomiting.  Mild diarrhea.  Review of Systems  Constitutional: Positive for malaise/fatigue. Negative for chills, diaphoresis, fever and weight loss.  HENT: Negative for nosebleeds and sore throat.   Eyes: Negative for double vision.  Respiratory: Negative for cough, hemoptysis, sputum production, shortness of breath and wheezing.   Cardiovascular: Negative for chest pain, palpitations, orthopnea and leg swelling.  Gastrointestinal: Positive for diarrhea. Negative for abdominal pain, blood in stool, constipation, heartburn, melena, nausea and vomiting.  Genitourinary: Negative for dysuria, frequency and urgency.  Musculoskeletal: Negative for back pain and joint pain.  Skin: Negative.  Negative for itching and rash.  Neurological: Negative for dizziness, tingling, focal weakness, weakness and headaches.  Endo/Heme/Allergies: Does not bruise/bleed easily.  Psychiatric/Behavioral: Negative for depression. The patient is not nervous/anxious and does not have insomnia.       PAST MEDICAL HISTORY :  Past Medical History:  Diagnosis Date  . Arthritis   . Cancer of lower-outer quadrant of female  breast ([place]) 08/06/2011   RIGHT   . Diabetes mellitus ([place]) 03/24/2012  . High cholesterol   . History of kidney stones    . Hx of bilateral breast implants   . Hypertension   . PONV (postoperative nausea and vomiting)   . Type II diabetes mellitus ([facility])    fasting 140-150  . Vertigo    LAST WEEK  . Vertigo 01/2017    PAST SURGICAL HISTORY :   Past Surgical History:  Procedure Laterality Date  . BREAST BIOPSY  07/2011   right  . BREAST RECONSTRUCTION  03/07/2012   Procedure: BREAST RECONSTRUCTION;  Surgeon: Crissie Reese, MD;  Location: [place];  Service: Plastics;  Laterality: Left;  BREAST RECONSTRUCTION WITH PLACEMENT OF TISSUE EXPANDER TO LEFT BREAST  . BREAST RECONSTRUCTION  02/06/2013  . CESAREAN SECTION  [id number]  . ENDOBRONCHIAL ULTRASOUND N/A 02/24/2017   Procedure: ENDOBRONCHIAL ULTRASOUND;  Surgeon: [name], MD;  Location: [facility];  Service: Cardiopulmonary;  Laterality: N/A;  . LATISSIMUS FLAP TO BREAST Right 02/06/2013   Procedure: LATISSIMUS FLAP TO RIGHT BREAST WITH IMPLANT;  Surgeon: Crissie Reese, MD;  Location: [facility];  Service: Plastics;  Laterality: Right;  . MASTECTOMY  03/07/12   modified right; total left  . MODIFIED MASTECTOMY  03/07/2012   Procedure: MODIFIED MASTECTOMY;  Surgeon: Haywood Lasso, MD;  Location: [place];  Service: General;  Laterality: Right;  . PORTACATH PLACEMENT  07/2011  . SCAR REVISION  03/30/2012   Procedure: SCAR REVISION;  Surgeon: Haywood Lasso, MD;  Location: [place];  Service: General;  Laterality: Right;  CLOSURE OF MASTECTOMY INCISION  . WISDOM TOOTH EXTRACTION      FAMILY HISTORY :   Family History  Problem Relation Age of Onset  . Cancer Mother        breast    SOCIAL HISTORY:   Social History   Tobacco Use  . Smoking status: Never Smoker  . Smokeless tobacco: Never Used  Substance Use Topics  . Alcohol use: No  . Drug use: No    ALLERGIES:  is allergic to sulfa antibiotics.  MEDICATIONS:  Current Outpatient Medications  Medication Sig Dispense Refill  . aspirin (ASPIRIN CHILDRENS) 81 MG chewable tablet Chew  1 tablet (81 mg total) by mouth daily. 120 tablet 0  . atorvastatin (LIPITOR) 20 MG tablet Take 20 mg by mouth every morning.    . Calcium Carbonate-Vitamin D (CALCIUM 600 + D PO) Take 1 tablet by mouth 2 (two) times daily.    . fulvestrant (FASLODEX) 250 MG/5ML injection Inject into the muscle every 30 (thirty) days. One injection each buttock over 1-2 minutes. Warm prior to use.    Marland Kitchen glipiZIDE (GLUCOTROL) 10 MG tablet Take 10 mg by mouth daily before breakfast.    . levothyroxine (SYNTHROID, LEVOTHROID) 25 MCG tablet TAKE 1 TABLET (25 MCG TOTAL) BY MOUTH DAILY BEFORE BREAKFAST. 30 tablet 2  . lisinopril (PRINIVIL,ZESTRIL) 10 MG tablet Take 10 mg by mouth every morning.     . pantoprazole (PROTONIX) 40 MG tablet Take 1 tablet (40 mg total) by mouth daily. 30 mins prior to meals; On empty stomach 30 tablet 0  . Semaglutide (OZEMPIC) 0.25 or 0.5 MG/DOSE SOPN Inject 0.25 mg into the skin once a week.    Marland Kitchen VERZENIO 100 MG tablet TAKE 1 TABLET (100 MG TOTAL) BY MOUTH 2 (TWO) TIMES DAILY. 56 tablet 4  . loperamide (IMODIUM A-D) 2 MG tablet Take 2 mg by mouth  4 (four) times daily as needed for diarrhea or loose stools.    . ondansetron (ZOFRAN) 8 MG tablet Take 1 tablet (8 mg total) every 8 (eight) hours as needed by mouth for nausea or vomiting. (Patient not taking: Reported on 03/09/2018) 20 tablet 3  . prochlorperazine (COMPAZINE) 10 MG tablet Take 1 tablet (10 mg total) by mouth every 6 (six) hours as needed for nausea or vomiting. (Patient not taking: Reported on 02/05/2018) 30 tablet 0   No current facility-administered medications for this visit.     PHYSICAL EXAMINATION: ECOG PERFORMANCE STATUS: 0 - Asymptomatic  BP 125/85   Pulse 91   Temp 97.9 F (36.6 C) (Tympanic)   Resp 20   Ht _0  (1.575 m)   Wt 175 lb (79.4 kg)   BMI 32.01 kg/m   Filed Weights   03/09/18 0912  Weight: 175 lb (79.4 kg)    GENERAL: Well-nourished well-developed; Alert, no distress and comfortable.   Alone. EYES: no pallor or icterus OROPHARYNX: no thrush or ulceration; NECK: supple; no lymph nodes felt. LYMPH:  no palpable lymphadenopathy in the axillary or inguinal regions LUNGS: Decreased breath sounds auscultation bilaterally. No wheeze or crackles HEART/CVS: regular rate & rhythm and no murmurs; No lower extremity edema ABDOMEN:abdomen soft, non-tender and normal bowel sounds. No hepatomegaly or splenomegaly.  Musculoskeletal:no cyanosis of digits and no clubbing  PSYCH: alert & oriented x 3 with fluent speech NEURO: no focal motor/sensory deficits SKIN:  no rashes or significant lesions    LABORATORY DATA:  I have reviewed the data as listed    Component Value Date/Time   NA 136 03/09/2018 0848   NA 135 11/28/2014 1401   K 4.6 03/09/2018 0848   K 4.7 11/28/2014 1401   CL 103 03/09/2018 0848   CL 99 (L) 11/28/2014 1401   CO2 23 03/09/2018 0848   CO2 28 11/28/2014 1401   GLUCOSE 184 (H) 03/09/2018 0848   GLUCOSE 215 (H) 11/28/2014 1401   BUN 18 03/09/2018 0848   BUN 16 11/28/2014 1401   CREATININE 1.35 (H) 03/09/2018 0848   CREATININE 0.96 11/28/2014 1401   CALCIUM 10.0 03/09/2018 0848   CALCIUM 9.9 11/28/2014 1401   PROT 7.1 03/09/2018 0848   PROT 7.6 11/28/2014 1401   ALBUMIN 3.9 03/09/2018 0848   ALBUMIN 4.5 11/28/2014 1401   AST 40 03/09/2018 0848   AST 26 11/28/2014 1401   ALT 27 03/09/2018 0848   ALT 28 11/28/2014 1401   ALKPHOS 45 03/09/2018 0848   ALKPHOS 78 11/28/2014 1401   BILITOT 0.5 03/09/2018 0848   BILITOT 0.5 11/28/2014 1401   GFRNONAA 41 (L) 03/09/2018 0848   GFRNONAA >60 11/28/2014 1401   GFRAA 47 (L) 03/09/2018 0848   GFRAA >60 11/28/2014 1401    No results found for: SPEP, UPEP  Lab Results  Component Value Date   WBC 3.0 (L) 03/09/2018   NEUTROABS 1.4 03/09/2018   HGB 11.0 (L) 03/09/2018   HCT 31.4 (L) 03/09/2018   MCV 98.6 03/09/2018   PLT 266 03/09/2018      Chemistry      Component Value Date/Time   NA 136 03/09/2018  0848   NA 135 11/28/2014 1401   K 4.6 03/09/2018 0848   K 4.7 11/28/2014 1401   CL 103 03/09/2018 0848   CL 99 (L) 11/28/2014 1401   CO2 23 03/09/2018 0848   CO2 28 11/28/2014 1401   BUN 18 03/09/2018 0848   BUN 16 11/28/2014 1401  CREATININE 1.35 (H) 03/09/2018 0848   CREATININE 0.96 11/28/2014 1401      Component Value Date/Time   CALCIUM 10.0 03/09/2018 0848   CALCIUM 9.9 11/28/2014 1401   ALKPHOS 45 03/09/2018 0848   ALKPHOS 78 11/28/2014 1401   AST 40 03/09/2018 0848   AST 26 11/28/2014 1401   ALT 27 03/09/2018 0848   ALT 28 11/28/2014 1401   BILITOT 0.5 03/09/2018 0848   BILITOT 0.5 11/28/2014 1401       RADIOGRAPHIC STUDIES: I have personally reviewed the radiological images as listed and agreed with the findings in the report. No results found.   ASSESSMENT & PLAN:  Carcinoma of lower-outer quadrant of right breast in female, estrogen receptor positive ([place]) #Recurrent breast cancer-ER positive PR negative ? HER-2/neu-currently on Faslodex plus abema. Jun 28th 2019- CTA- NEG- stable/pre-carinal LN. July 2019- CT A/P-NEG.  #Continue Faslodex plus abema; tolerating well no evidence of progression.   # Atypical cardiac chest pain; improved work up NEG.   # Poorly controlled Blood glucose-stable FBS- 184.   # CKD-II; creatinine 1.3. Stable; monitor closely.   # Follow up in 4 weeks/faslodex; ca-27-29; Aug 13th/ [place].   # I reviewed the blood work- with the patient in detail; also reviewed the imaging independently [as summarized above]; and with the patient in detail.     No orders of the defined types were placed in this encounter.  All questions were answered. The patient knows to call the clinic with any problems, questions or concerns.      [name], MD 03/09/2018 6:54 PM

## 2018-03-09 NOTE — Assessment & Plan Note (Addendum)
#  Recurrent breast cancer-ER positive PR negative ? HER-2/neu-currently on Faslodex plus abema. Jun 28th 2019- CTA- NEG- stable/pre-carinal LN. July 2019- CT A/P-NEG.  #Continue Faslodex plus abema; tolerating well no evidence of progression.   # Atypical cardiac chest pain; improved work up NEG.   # Poorly controlled Blood glucose-stable FBS- 184.   # CKD-II; creatinine 1.3. Stable; monitor closely.   # Follow up in 4 weeks/faslodex; ca-27-29; Aug 13th/ [place].   # I reviewed the blood work- with the patient in detail; also reviewed the imaging independently [as summarized above]; and with the patient in detail.

## 2018-03-10 LAB — CANCER ANTIGEN 27.29: CAN 27.29: 30.9 U/mL (ref 0.0–38.6)

## 2018-03-13 MED FILL — VERZENIO 100 MG TAB: 100 | 28 days supply | Qty: 56 | Fill #3

## 2018-03-14 ENCOUNTER — Telehealth: Payer: Self-pay | Admitting: *Deleted

## 2018-03-14 NOTE — Telephone Encounter (Signed)
-----   Message from [name], MD sent at 03/11/2018  8:16 AM EDT ----- Please inform pt that her tumor maker is stable. No new recommendations/ follow up as planned. Dr.B

## 2018-03-14 NOTE — Telephone Encounter (Signed)
Left vm for patient to return my phone call. 

## 2018-04-04 ENCOUNTER — Ambulatory Visit: Payer: BLUE CROSS/BLUE SHIELD | Admitting: Internal Medicine

## 2018-04-04 ENCOUNTER — Other Ambulatory Visit: Payer: BLUE CROSS/BLUE SHIELD

## 2018-04-04 ENCOUNTER — Ambulatory Visit: Payer: BLUE CROSS/BLUE SHIELD

## 2018-04-06 ENCOUNTER — Telehealth: Payer: Self-pay | Admitting: Pharmacist

## 2018-04-06 ENCOUNTER — Other Ambulatory Visit: Payer: Self-pay | Admitting: Internal Medicine

## 2018-04-06 DIAGNOSIS — C50511 Malignant neoplasm of lower-outer quadrant of right female breast: Secondary | ICD-10-CM

## 2018-04-06 DIAGNOSIS — Z170 Estrogen receptor positive status [ER+]: Principal | ICD-10-CM

## 2018-04-06 NOTE — Telephone Encounter (Signed)
Oral Chemotherapy Pharmacist Encounter  Follow-Up Form  Called patient today to follow up regarding patient's oral chemotherapy medication: Verzenio (abemaciclib)  Original Start date of oral chemotherapy: 02/2017  Pt reports 0 tablets/doses of Verzenio missed in the last month.    Pt reports the following side effects: None reported  Recent labs reviewed: CA 27.29 from 03/09/18  New medications?: None reported  Other Issues: None reported  Patient knows to call the office with questions or concerns. Oral Oncology Clinic will continue to follow.  Darl Pikes, PharmD, BCPS, [facility] Hematology/Oncology Clinical Pharmacist [facility] [phone]  04/06/2018 4:28 PM

## 2018-04-10 ENCOUNTER — Telehealth: Payer: Self-pay | Admitting: Pharmacy Technician

## 2018-04-10 NOTE — Telephone Encounter (Signed)
Oral Oncology Patient Advocate Encounter  Received notification from [place] that the prior authorization for Verzenio has expired.    I have submitted a prior authorization request through CoverMyMeds.  Key [id number] Status is pending  Oral Oncology Clinic will continue to follow.  [facility] [place] Phone [phone] Fax [phone] 04/10/2018 11:55 AM

## 2018-04-11 ENCOUNTER — Inpatient Hospital Stay: Payer: BLUE CROSS/BLUE SHIELD | Attending: Internal Medicine

## 2018-04-11 ENCOUNTER — Telehealth: Payer: Self-pay

## 2018-04-11 ENCOUNTER — Other Ambulatory Visit: Payer: Self-pay

## 2018-04-11 ENCOUNTER — Inpatient Hospital Stay: Payer: BLUE CROSS/BLUE SHIELD

## 2018-04-11 ENCOUNTER — Telehealth: Payer: Self-pay | Admitting: Internal Medicine

## 2018-04-11 ENCOUNTER — Ambulatory Visit
Admission: RE | Admit: 2018-04-11 | Discharge: 2018-04-11 | Disposition: A | Discharge status: Home / Self Care | Payer: BLUE CROSS/BLUE SHIELD | Source: Ambulatory Visit | Attending: Internal Medicine | Admitting: Internal Medicine

## 2018-04-11 ENCOUNTER — Inpatient Hospital Stay (HOSPITAL_BASED_OUTPATIENT_CLINIC_OR_DEPARTMENT_OTHER): Payer: BLUE CROSS/BLUE SHIELD | Admitting: Internal Medicine

## 2018-04-11 VITALS — BP 128/85 | HR 106 | Temp 97.9°F | Resp 20 | Ht 62.0 in | Wt 180.8 lb

## 2018-04-11 DIAGNOSIS — M2550 Pain in unspecified joint: Secondary | ICD-10-CM

## 2018-04-11 DIAGNOSIS — I82621 Acute embolism and thrombosis of deep veins of right upper extremity: Secondary | ICD-10-CM | POA: Insufficient documentation

## 2018-04-11 DIAGNOSIS — B356 Tinea cruris: Secondary | ICD-10-CM

## 2018-04-11 DIAGNOSIS — N182 Chronic kidney disease, stage 2 (mild): Secondary | ICD-10-CM | POA: Diagnosis not present

## 2018-04-11 DIAGNOSIS — K529 Noninfective gastroenteritis and colitis, unspecified: Secondary | ICD-10-CM | POA: Insufficient documentation

## 2018-04-11 DIAGNOSIS — C50511 Malignant neoplasm of lower-outer quadrant of right female breast: Secondary | ICD-10-CM | POA: Diagnosis present

## 2018-04-11 DIAGNOSIS — M7989 Other specified soft tissue disorders: Secondary | ICD-10-CM | POA: Diagnosis not present

## 2018-04-11 DIAGNOSIS — M79601 Pain in right arm: Secondary | ICD-10-CM | POA: Insufficient documentation

## 2018-04-11 DIAGNOSIS — Z170 Estrogen receptor positive status [ER+]: Secondary | ICD-10-CM | POA: Insufficient documentation

## 2018-04-11 DIAGNOSIS — G8929 Other chronic pain: Secondary | ICD-10-CM | POA: Diagnosis not present

## 2018-04-11 DIAGNOSIS — I808 Phlebitis and thrombophlebitis of other sites: Secondary | ICD-10-CM | POA: Diagnosis not present

## 2018-04-11 DIAGNOSIS — Z9011 Acquired absence of right breast and nipple: Secondary | ICD-10-CM | POA: Insufficient documentation

## 2018-04-11 LAB — COMPREHENSIVE METABOLIC PANEL
ALBUMIN: 3.9 g/dL (ref 3.5–5.0)
ALK PHOS: 54 U/L (ref 38–126)
ALT: 23 U/L (ref 0–44)
AST: 40 U/L (ref 15–41)
Anion gap: 14 (ref 5–15)
BUN: 18 mg/dL (ref 8–23)
CALCIUM: 9.8 mg/dL (ref 8.9–10.3)
CO2: 24 mmol/L (ref 22–32)
CREATININE: 1.26 mg/dL — AB (ref 0.44–1.00)
Chloride: 99 mmol/L (ref 98–111)
GFR calc Af Amer: 51 mL/min — ABNORMAL LOW (ref >60)
GFR calc non Af Amer: 44 mL/min — ABNORMAL LOW (ref >60)
GLUCOSE: 218 mg/dL — AB (ref 70–99)
Potassium: 4.8 mmol/L (ref 3.5–5.1)
SODIUM: 137 mmol/L (ref 135–145)
TOTAL PROTEIN: 7.6 g/dL (ref 6.5–8.1)
Total Bilirubin: 0.3 mg/dL (ref 0.3–1.2)

## 2018-04-11 LAB — CBC WITH DIFFERENTIAL/PLATELET
BASOS PCT: 2 %
Basophils Absolute: 0.1 10*3/uL (ref 0–0.1)
EOS PCT: 1 %
Eosinophils Absolute: 0.1 10*3/uL (ref 0–0.7)
HEMATOCRIT: 35.3 % (ref 35.0–47.0)
Hemoglobin: 12 g/dL (ref 12.0–16.0)
Lymphocytes Relative: 40 %
Lymphs Abs: 1.7 10*3/uL (ref 1.0–3.6)
MCH: 33.7 pg (ref 26.0–34.0)
MCHC: 34.1 g/dL (ref 32.0–36.0)
MCV: 98.8 fL (ref 80.0–100.0)
MONO ABS: 0.2 10*3/uL (ref 0.2–0.9)
MONOS PCT: 5 %
Neutro Abs: 2.2 10*3/uL (ref 1.4–6.5)
Neutrophils Relative %: 52 %
PLATELETS: 285 10*3/uL (ref 150–440)
RBC: 3.57 MIL/uL — ABNORMAL LOW (ref 3.80–5.20)
RDW: 13.7 % (ref 11.5–14.5)
WBC: 4.3 10*3/uL (ref 3.6–11.0)

## 2018-04-11 MED ORDER — FULVESTRANT 250 MG/5ML IM SOLN
500.0000 mg | Freq: Once | INTRAMUSCULAR | Status: AC
  Administered 2018-04-11: 500 mg via INTRAMUSCULAR

## 2018-04-11 MED ORDER — APIXABAN 5 MG PO TABS: 5.0000 mg | ORAL_TABLET | Freq: Two times a day (BID) | ORAL | 3 refills | Status: DC

## 2018-04-11 NOTE — Assessment & Plan Note (Addendum)
#  Recurrent breast cancer-ER positive PR negative ? HER-2/neu-currently on Faslodex plus abema. Jun 28th 2019- CTA- NEG- stable/pre-carinal LN. July 2019- CT A/P-NEG.  #Continue Faslodex plus abema; tolerating well no evidence of progression.   #Right arm swelling concerning for DVT less likely edema.  Recommend stat Dopplers.  # CKD-II; creatinine 1.3. Stable; monitor closely.   #Tinea cruris-recommend jock itch powder/keeping the area dry.  # follow up in 4 weeks/cbc/cmp/ca-27-29; faslodex.   Addendum: DVT the right upper extremity/SVT-started on Eliquis.  Discussed with pharmacy.

## 2018-04-11 NOTE — Telephone Encounter (Signed)
Got call from radiology that pt has DVT in Right UE; sent in script for eliqis 5 mg BID. Asked pt to call us back if expensive or cannot fill.   July, appreciate your help.

## 2018-04-11 NOTE — Progress Notes (Signed)
Patient c/o acute right arm pain - AC edema; pain present near brachial vein region. Patient also c/o dysuria. Recently tx for yeast infection in genitals. Had 1 dose of diflucan. Yeast infection not improved. Pt states that the yeast has spread to her pubic region and groin area. Would like Dr. Rogue Bussing to treat this area.  Patient denies any N&V. Reports intermittent constipation.

## 2018-04-11 NOTE — Patient Instructions (Signed)

## 2018-04-11 NOTE — Progress Notes (Signed)
[facility] OFFICE PROGRESS NOTE  Patient Care Team: Karen Kitchens, MD as PCP - General (Family Medicine) Neldon Mc, MD (General Surgery) Everlene Farrier, MD (Obstetrics and Gynecology) Noreene Filbert, MD Forest Gleason, MD (Inactive) (Unknown Physician Specialty)  Cancer Staging Carcinoma of lower-outer quadrant of right breast in female, estrogen receptor positive [facility]) Staging form: Breast, AJCC 7th Edition - Pathologic: ypT1c,ypN2a, MX - Signed by Haywood Lasso, MD on 03/10/2012    Oncology History   # DEC 2012- RIGHT BREAST CA T2 N2 M0 tumor from biopsy.  Estrogen receptor positive, Progesterone receptor positive.  Current receptor negative by FISH 2. Neoadjuvant chemotherapy started in December of 28 with Cytoxan Adriamycin 3. Started on Taxol weekly chemotherapy. 4. Patient finished 12  cycles of Taxol chemotherapy in May of 2013.     5. Status post right modified radical mastectomy [Dr.Bowers; [place]] June of 2013, ypT1c  yp N2  MO. started also on Lerazole    7. Radiation therapy to the right breast (September of 2013).  Lymph node was positive for HER-2 receptor gene amplification of 2.22.  Will proceed with Herceptin treatment starting in September of 2013.   8.Patient has finished Herceptin (maintenance therapy) in August of 2014 8. Start patient on letrozole from November, 2013. 9. Patient started on Herceptin in September 2013.   10Patient finished 12 months of Herceptin therapy on August, 2014  # 6. Status post left side prophylactic mastectomy.  #Late MAY 2018-RECURRENCE BREAST CA- ER positive/PR negative; ?? HER-2/neu- [biopsy- proven-mediastinal lymph node; in suff for her 2 testing].  [elevated Tumor marker- CT/PET- uptake in Right Mediastinal LN; Sternum [June 2018 EBUS- Dr.Kasa]   # March 10 2017- faslodex + Abema; OCT 5th CT-PR of mediastinal LN --------------------------------------------    DIAGNOSIS: [ BREAST CANCER-  ER/PR/HER2 NEU POS  STAGE:  4   ;GOALS: PALLIATIVE  CURRENT/MOST RECENT THERAPY - ABEMA + FASLODEX      Carcinoma of lower-outer quadrant of right breast in female, estrogen receptor positive ([place])      INTERVAL HISTORY:  Kimberly [name] 62 y.o.  female pleasant patient above history of metastatic ER PR positive HER-2/neu positive breast cancer currently on abema+ Faslodex is here for follow-up.  Patient notes to have swelling of the right upper extremity for the last 4 days.  Mild tenderness.  No swelling in the legs.  Complains of fatigue.  Chronic back pain joint pains.  Chronic mild diarrhea.  Review of Systems  Constitutional: Positive for malaise/fatigue. Negative for chills, diaphoresis, fever and weight loss.  HENT: Negative for nosebleeds and sore throat.   Eyes: Negative for double vision.  Respiratory: Negative for cough, hemoptysis, sputum production, shortness of breath and wheezing.   Cardiovascular: Negative for chest pain, palpitations, orthopnea and leg swelling.  Gastrointestinal: Positive for diarrhea. Negative for abdominal pain, blood in stool, constipation, heartburn, melena, nausea and vomiting.  Genitourinary: Negative for dysuria, frequency and urgency.  Musculoskeletal: Positive for back pain and joint pain.  Skin: Negative.  Negative for itching and rash.  Neurological: Negative for dizziness, tingling, focal weakness, weakness and headaches.  Endo/Heme/Allergies: Does not bruise/bleed easily.  Psychiatric/Behavioral: Negative for depression. The patient is not nervous/anxious and does not have insomnia.       PAST MEDICAL HISTORY :  Past Medical History:  Diagnosis Date  . Arthritis   . Cancer of lower-outer quadrant of female breast ([place]) 08/06/2011   RIGHT   . Diabetes mellitus ([place]) 03/24/2012  . High  cholesterol   . History of kidney stones   . Hx of bilateral breast implants   . Hypertension   . PONV (postoperative nausea and vomiting)    . Type II diabetes mellitus ([facility])    fasting 140-150  . Vertigo    LAST WEEK  . Vertigo 01/2017    PAST SURGICAL HISTORY :   Past Surgical History:  Procedure Laterality Date  . BREAST BIOPSY  07/2011   right  . BREAST RECONSTRUCTION  03/07/2012   Procedure: BREAST RECONSTRUCTION;  Surgeon: Crissie Reese, MD;  Location: [facility];  Service: Plastics;  Laterality: Left;  BREAST RECONSTRUCTION WITH PLACEMENT OF TISSUE EXPANDER TO LEFT BREAST  . BREAST RECONSTRUCTION  02/06/2013  . CESAREAN SECTION  [id number]  . ENDOBRONCHIAL ULTRASOUND N/A 02/24/2017   Procedure: ENDOBRONCHIAL ULTRASOUND;  Surgeon: [name], MD;  Location: [facility];  Service: Cardiopulmonary;  Laterality: N/A;  . LATISSIMUS FLAP TO BREAST Right 02/06/2013   Procedure: LATISSIMUS FLAP TO RIGHT BREAST WITH IMPLANT;  Surgeon: Crissie Reese, MD;  Location: [facility];  Service: Plastics;  Laterality: Right;  . MASTECTOMY  03/07/12   modified right; total left  . MODIFIED MASTECTOMY  03/07/2012   Procedure: MODIFIED MASTECTOMY;  Surgeon: Haywood Lasso, MD;  Location: [place] [facility];  Service: General;  Laterality: Right;  . PORTACATH PLACEMENT  07/2011  . SCAR REVISION  03/30/2012   Procedure: SCAR REVISION;  Surgeon: Haywood Lasso, MD;  Location: [facility];  Service: General;  Laterality: Right;  CLOSURE OF MASTECTOMY INCISION  . WISDOM TOOTH EXTRACTION      FAMILY HISTORY :   Family History  Problem Relation Age of Onset  . Cancer Mother        breast    SOCIAL HISTORY:   Social History   Tobacco Use  . Smoking status: Never Smoker  . Smokeless tobacco: Never Used  Substance Use Topics  . Alcohol use: No  . Drug use: No    ALLERGIES:  is allergic to sulfa antibiotics.  MEDICATIONS:  Current Outpatient Medications  Medication Sig Dispense Refill  . aspirin (ASPIRIN CHILDRENS) 81 MG chewable tablet Chew 1 tablet (81 mg total) by mouth daily. 120 tablet 0  . atorvastatin (LIPITOR) 20 MG tablet Take 20  mg by mouth every morning.    . Calcium Carbonate-Vitamin D (CALCIUM 600 + D PO) Take 1 tablet by mouth 2 (two) times daily.    . fulvestrant (FASLODEX) 250 MG/5ML injection Inject into the muscle every 30 (thirty) days. One injection each buttock over 1-2 minutes. Warm prior to use.    Marland Kitchen glipiZIDE (GLUCOTROL) 10 MG tablet Take 10 mg by mouth daily before breakfast.    . levothyroxine (SYNTHROID, LEVOTHROID) 25 MCG tablet TAKE 1 TABLET (25 MCG TOTAL) BY MOUTH DAILY BEFORE BREAKFAST. 30 tablet 2  . lisinopril (PRINIVIL,ZESTRIL) 10 MG tablet Take 10 mg by mouth every morning.     . Semaglutide (OZEMPIC) 0.25 or 0.5 MG/DOSE SOPN Inject 0.25 mg into the skin once a week.    Marland Kitchen VERZENIO 100 MG tablet TAKE 1 TABLET (100 MG TOTAL) BY MOUTH 2 (TWO) TIMES DAILY. 56 tablet 4  . apixaban (ELIQUIS) 5 MG TABS tablet Take 1 tablet (5 mg total) by mouth 2 (two) times daily. 60 tablet 3  . loperamide (IMODIUM A-D) 2 MG tablet Take 2 mg by mouth 4 (four) times daily as needed for diarrhea or loose stools.    . ondansetron (ZOFRAN) 8 MG tablet  Take 1 tablet (8 mg total) every 8 (eight) hours as needed by mouth for nausea or vomiting. (Patient not taking: Reported on 03/09/2018) 20 tablet 3  . pantoprazole (PROTONIX) 40 MG tablet Take 1 tablet (40 mg total) by mouth daily. 30 mins prior to meals; On empty stomach (Patient not taking: Reported on 04/11/2018) 30 tablet 0  . prochlorperazine (COMPAZINE) 10 MG tablet Take 1 tablet (10 mg total) by mouth every 6 (six) hours as needed for nausea or vomiting. (Patient not taking: Reported on 02/05/2018) 30 tablet 0   No current facility-administered medications for this visit.     PHYSICAL EXAMINATION: ECOG PERFORMANCE STATUS: 1 - Symptomatic but completely ambulatory  BP 128/85 (Patient Position: Sitting)   Pulse (!) 106   Temp 97.9 F (36.6 C) (Tympanic)   Resp 20   Ht '5\' 2"'  (1.575 m)   Wt 180 lb 12.4 oz (82 kg)   BMI 33.06 kg/m   Filed Weights   04/11/18 1339   Weight: 180 lb 12.4 oz (82 kg)    GENERAL: Well-nourished well-developed; Alert, no distress and comfortable.  accompanied by family.  EYES: no pallor or icterus OROPHARYNX: no thrush or ulceration; NECK: supple; no lymph nodes felt. LYMPH:  no palpable lymphadenopathy in the axillary or inguinal regions LUNGS: Decreased breath sounds auscultation bilaterally. No wheeze or crackles HEART/CVS: regular rate & rhythm and no murmurs; No lower extremity edema ABDOMEN:abdomen soft, non-tender and normal bowel sounds. No hepatomegaly or splenomegaly.  Musculoskeletal:no cyanosis of digits and no clubbing; right upper extremity swollen compared to left. PSYCH: alert & oriented x 3 with fluent speech NEURO: no focal motor/sensory deficits SKIN:  no rashes or significant lesions    LABORATORY DATA:  I have reviewed the data as listed    Component Value Date/Time   NA 137 04/11/2018 1329   NA 135 11/28/2014 1401   K 4.8 04/11/2018 1329   K 4.7 11/28/2014 1401   CL 99 04/11/2018 1329   CL 99 (L) 11/28/2014 1401   CO2 24 04/11/2018 1329   CO2 28 11/28/2014 1401   GLUCOSE 218 (H) 04/11/2018 1329   GLUCOSE 215 (H) 11/28/2014 1401   BUN 18 04/11/2018 1329   BUN 16 11/28/2014 1401   CREATININE 1.26 (H) 04/11/2018 1329   CREATININE 0.96 11/28/2014 1401   CALCIUM 9.8 04/11/2018 1329   CALCIUM 9.9 11/28/2014 1401   PROT 7.6 04/11/2018 1329   PROT 7.6 11/28/2014 1401   ALBUMIN 3.9 04/11/2018 1329   ALBUMIN 4.5 11/28/2014 1401   AST 40 04/11/2018 1329   AST 26 11/28/2014 1401   ALT 23 04/11/2018 1329   ALT 28 11/28/2014 1401   ALKPHOS 54 04/11/2018 1329   ALKPHOS 78 11/28/2014 1401   BILITOT 0.3 04/11/2018 1329   BILITOT 0.5 11/28/2014 1401   GFRNONAA 44 (L) 04/11/2018 1329   GFRNONAA >60 11/28/2014 1401   GFRAA 51 (L) 04/11/2018 1329   GFRAA >60 11/28/2014 1401    No results found for: SPEP, UPEP  Lab Results  Component Value Date   WBC 4.3 04/11/2018   NEUTROABS 2.2  04/11/2018   HGB 12.0 04/11/2018   HCT 35.3 04/11/2018   MCV 98.8 04/11/2018   PLT 285 04/11/2018      Chemistry      Component Value Date/Time   NA 137 04/11/2018 1329   NA 135 11/28/2014 1401   K 4.8 04/11/2018 1329   K 4.7 11/28/2014 1401   CL 99 04/11/2018 1329  CL 99 (L) 11/28/2014 1401   CO2 24 04/11/2018 1329   CO2 28 11/28/2014 1401   BUN 18 04/11/2018 1329   BUN 16 11/28/2014 1401   CREATININE 1.26 (H) 04/11/2018 1329   CREATININE 0.96 11/28/2014 1401      Component Value Date/Time   CALCIUM 9.8 04/11/2018 1329   CALCIUM 9.9 11/28/2014 1401   ALKPHOS 54 04/11/2018 1329   ALKPHOS 78 11/28/2014 1401   AST 40 04/11/2018 1329   AST 26 11/28/2014 1401   ALT 23 04/11/2018 1329   ALT 28 11/28/2014 1401   BILITOT 0.3 04/11/2018 1329   BILITOT 0.5 11/28/2014 1401       RADIOGRAPHIC STUDIES: I have personally reviewed the radiological images as listed and agreed with the findings in the report. No results found.   ASSESSMENT & PLAN:  Carcinoma of lower-outer quadrant of right breast in female, estrogen receptor positive ([place]) #Recurrent breast cancer-ER positive PR negative ? HER-2/neu-currently on Faslodex plus abema. Jun 28th 2019- CTA- NEG- stable/pre-carinal LN. July 2019- CT A/P-NEG.  #Continue Faslodex plus abema; tolerating well no evidence of progression.   #Right arm swelling concerning for DVT less likely edema.  Recommend stat Dopplers.  # CKD-II; creatinine 1.3. Stable; monitor closely.   #Tinea cruris-recommend jock itch powder/keeping the area dry.  # follow up in 4 weeks/cbc/cmp/ca-27-29; faslodex.   Addendum: DVT the right upper extremity/SVT-started on Eliquis.  Discussed with pharmacy.    Orders Placed This Encounter  Procedures  . US Venous Img Upper Uni Right    Standing Status:   Future    Number of Occurrences:   1    Standing Expiration Date:   06/11/2019    Order Specific Question:   Reason for Exam (SYMPTOM  OR DIAGNOSIS  REQUIRED)    Answer:   right UE swelling    Order Specific Question:   Preferred imaging location?    Answer:   [facility]    Order Specific Question:   Call Results- Best Contact Number?    Answer:   [id number] patient  . CBC with Differential/Platelet    Standing Status:   Future    Standing Expiration Date:   04/12/2019  . Comprehensive metabolic panel    Standing Status:   Future    Standing Expiration Date:   04/12/2019  . Cancer antigen 27.29    Standing Status:   Future    Standing Expiration Date:   04/11/2019   All questions were answered. The patient knows to call the clinic with any problems, questions or concerns.      [name], MD 04/18/2018 6:01 PM

## 2018-04-11 NOTE — Telephone Encounter (Signed)
Dr. Rogue Bussing has sent in a prescription for Eliquis. Would you mind helping patient receive assistance for this medication? Thanks!

## 2018-04-12 ENCOUNTER — Telehealth: Payer: Self-pay | Admitting: Pharmacy Technician

## 2018-04-12 LAB — CANCER ANTIGEN 27.29: CA 27.29: 31.2 U/mL (ref 0.0–38.6)

## 2018-04-12 MED FILL — VERZENIO 100 MG TAB: 100 | 28 days supply | Qty: 56 | Fill #4

## 2018-04-12 NOTE — Telephone Encounter (Signed)
I called [facility] and the patients copay is $45.  Assistance shouldn't be needed at this time.  Bethena Roys

## 2018-04-12 NOTE — Telephone Encounter (Signed)
I called and spoke with Mrs Leinbach.  Since she has Pharmacist, community I signed her up for a copay card that brings her copay for Eliquis down to $10 a month.  I have called [facility] with the billing information.

## 2018-04-12 NOTE — Telephone Encounter (Signed)
Kimberly [name], patient contacted [place] and left a voicemail about Eliquis assistance. Patient asked if you would call her about any coupons you may know of - as the $45 is still a little too much. Would you mind contacting patient?

## 2018-04-12 NOTE — Telephone Encounter (Signed)
Patient Advocate Encounter   Was successful in obtaining a copay card for Eliquis.  This copay card will make the patients copay $10 for a 30 day supply.  I have spoken with the patient.    The billing information is as follows and has been shared with [facility] [phone]).   RxBin: [id number] PCN: [id number]  Member ID: [id number] Group ID: [id number]   Kimberly [name] Patient [place] Phone [phone] Fax [phone] 04/12/2018 2:42 PM

## 2018-04-12 NOTE — Telephone Encounter (Signed)
Oral Oncology Patient Advocate Encounter  Prior Authorization for Kimberly [name] has been approved.  I have informed the [facility].  PA# [id number] Effective dates: 04/10/2018 through 04/09/2019  Oral Oncology Clinic will continue to follow.   [name] Patient [name] Phone [phone] Fax [phone] 04/12/2018 3:14 PM

## 2018-05-09 ENCOUNTER — Other Ambulatory Visit: Payer: Self-pay | Admitting: Internal Medicine

## 2018-05-09 ENCOUNTER — Inpatient Hospital Stay: Payer: BLUE CROSS/BLUE SHIELD | Attending: Internal Medicine

## 2018-05-09 ENCOUNTER — Inpatient Hospital Stay: Payer: BLUE CROSS/BLUE SHIELD

## 2018-05-09 ENCOUNTER — Encounter: Payer: Self-pay | Admitting: Internal Medicine

## 2018-05-09 ENCOUNTER — Inpatient Hospital Stay (HOSPITAL_BASED_OUTPATIENT_CLINIC_OR_DEPARTMENT_OTHER): Payer: BLUE CROSS/BLUE SHIELD | Admitting: Internal Medicine

## 2018-05-09 VITALS — BP 120/80 | HR 74 | Temp 97.9°F | Resp 16 | Wt 177.0 lb

## 2018-05-09 DIAGNOSIS — Z5111 Encounter for antineoplastic chemotherapy: Secondary | ICD-10-CM | POA: Diagnosis present

## 2018-05-09 DIAGNOSIS — R5383 Other fatigue: Secondary | ICD-10-CM | POA: Insufficient documentation

## 2018-05-09 DIAGNOSIS — Z7901 Long term (current) use of anticoagulants: Secondary | ICD-10-CM | POA: Insufficient documentation

## 2018-05-09 DIAGNOSIS — C50511 Malignant neoplasm of lower-outer quadrant of right female breast: Secondary | ICD-10-CM

## 2018-05-09 DIAGNOSIS — Z170 Estrogen receptor positive status [ER+]: Principal | ICD-10-CM

## 2018-05-09 DIAGNOSIS — R197 Diarrhea, unspecified: Secondary | ICD-10-CM | POA: Insufficient documentation

## 2018-05-09 DIAGNOSIS — B356 Tinea cruris: Secondary | ICD-10-CM | POA: Diagnosis not present

## 2018-05-09 DIAGNOSIS — I82621 Acute embolism and thrombosis of deep veins of right upper extremity: Secondary | ICD-10-CM | POA: Diagnosis not present

## 2018-05-09 DIAGNOSIS — N182 Chronic kidney disease, stage 2 (mild): Secondary | ICD-10-CM | POA: Diagnosis not present

## 2018-05-09 LAB — COMPREHENSIVE METABOLIC PANEL
ALBUMIN: 4 g/dL (ref 3.5–5.0)
ALT: 27 U/L (ref 0–44)
AST: 44 U/L — AB (ref 15–41)
Alkaline Phosphatase: 53 U/L (ref 38–126)
Anion gap: 13 (ref 5–15)
BUN: 16 mg/dL (ref 8–23)
CHLORIDE: 97 mmol/L — AB (ref 98–111)
CO2: 23 mmol/L (ref 22–32)
Calcium: 9.6 mg/dL (ref 8.9–10.3)
Creatinine, Ser: 1.16 mg/dL — ABNORMAL HIGH (ref 0.44–1.00)
GFR calc Af Amer: 57 mL/min — ABNORMAL LOW (ref >60)
GFR, EST NON AFRICAN AMERICAN: 49 mL/min — AB (ref >60)
Glucose, Bld: 234 mg/dL — ABNORMAL HIGH (ref 70–99)
POTASSIUM: 4.3 mmol/L (ref 3.5–5.1)
Sodium: 133 mmol/L — ABNORMAL LOW (ref 135–145)
Total Bilirubin: 0.5 mg/dL (ref 0.3–1.2)
Total Protein: 7.7 g/dL (ref 6.5–8.1)

## 2018-05-09 LAB — CBC WITH DIFFERENTIAL/PLATELET
Basophils Absolute: 0.1 10*3/uL (ref 0–0.1)
Basophils Relative: 3 %
EOS PCT: 3 %
Eosinophils Absolute: 0.1 10*3/uL (ref 0–0.7)
HEMATOCRIT: 37.2 % (ref 35.0–47.0)
HEMOGLOBIN: 12.5 g/dL (ref 12.0–16.0)
LYMPHS ABS: 1.4 10*3/uL (ref 1.0–3.6)
LYMPHS PCT: 38 %
MCH: 33.5 pg (ref 26.0–34.0)
MCHC: 33.6 g/dL (ref 32.0–36.0)
MCV: 99.7 fL (ref 80.0–100.0)
Monocytes Absolute: 0.2 10*3/uL (ref 0.2–0.9)
Monocytes Relative: 5 %
Neutro Abs: 1.9 10*3/uL (ref 1.4–6.5)
Neutrophils Relative %: 51 %
PLATELETS: 298 10*3/uL (ref 150–440)
RBC: 3.73 MIL/uL — AB (ref 3.80–5.20)
RDW: 13.8 % (ref 11.5–14.5)
WBC: 3.7 10*3/uL (ref 3.6–11.0)

## 2018-05-09 MED ORDER — FULVESTRANT 250 MG/5ML IM SOLN
500.0000 mg | Freq: Once | INTRAMUSCULAR | Status: AC
  Administered 2018-05-09: 500 mg via INTRAMUSCULAR

## 2018-05-09 NOTE — Progress Notes (Signed)
.[place] OFFICE PROGRESS NOTE  Patient Care Team: Karen Kitchens, MD as PCP - General (Family Medicine) Neldon Mc, MD (General Surgery) Everlene Farrier, MD (Obstetrics and Gynecology) Noreene Filbert, MD Forest Gleason, MD (Inactive) (Unknown Physician Specialty)  Cancer Staging Carcinoma of lower-outer quadrant of right breast in female, estrogen receptor positive [facility]) Staging form: Breast, AJCC 7th Edition - Pathologic: ypT1c,ypN2a, MX - Signed by Haywood Lasso, MD on 03/10/2012    Oncology History   # DEC 2012- RIGHT BREAST CA T2 N2 M0 tumor from biopsy.  Estrogen receptor positive, Progesterone receptor positive.  Current receptor negative by FISH 2. Neoadjuvant chemotherapy started in December of 28 with Cytoxan Adriamycin 3. Started on Taxol weekly chemotherapy. 4. Patient finished 12  cycles of Taxol chemotherapy in May of 2013.     5. Status post right modified radical mastectomy [Dr.Bowers; [place]] June of 2013, ypT1c  yp N2  MO. started also on Lerazole    7. Radiation therapy to the right breast (September of 2013).  Lymph node was positive for HER-2 receptor gene amplification of 2.22.  Will proceed with Herceptin treatment starting in September of 2013.   8.Patient has finished Herceptin (maintenance therapy) in August of 2014 8. Start patient on letrozole from November, 2013. 9. Patient started on Herceptin in September 2013.   10Patient finished 12 months of Herceptin therapy on August, 2014  # 6. Status post left side prophylactic mastectomy.  #Late MAY 2018-RECURRENCE BREAST CA- ER positive/PR negative; ?? HER-2/neu- [biopsy- proven-mediastinal lymph node; in suff for her 2 testing].  [elevated Tumor marker- CT/PET- uptake in Right Mediastinal LN; Sternum [June 2018 EBUS- Dr.Kasa]   # March 10 2017- faslodex + Abema; OCT 5th CT-PR of mediastinal LN --------------------------------------------    DIAGNOSIS: [ BREAST CANCER-  ER/PR/HER2 NEU POS  STAGE:  4   ;GOALS: PALLIATIVE  CURRENT/MOST RECENT THERAPY - ABEMA + FASLODEX      Carcinoma of lower-outer quadrant of right breast in female, estrogen receptor positive ([place])   INTERVAL HISTORY:  Kimberly [name] 62 y.o.  female pleasant patient above history of metastatic ER PR positive HER-2/neu positive breast cancer currently on abema+ Faslodex is here for follow-up.  Patient interim was diagnosed with right upper extremity DVT she is currently on She notes to have improvement of the swelling and pain.  Not complete resolved.  Patient complains of mild diarrhea.  Otherwise no swelling in the legs.  Mild to moderate fatigue.  Continues to have skin rash in groin/perineal region-improved not resolved.  Review of Systems  Constitutional: Positive for malaise/fatigue. Negative for chills, diaphoresis, fever and weight loss.  HENT: Negative for nosebleeds and sore throat.   Eyes: Negative for double vision.  Respiratory: Negative for cough, hemoptysis, sputum production, shortness of breath and wheezing.   Cardiovascular: Negative for chest pain, palpitations, orthopnea and leg swelling.  Gastrointestinal: Positive for diarrhea. Negative for abdominal pain, blood in stool, constipation, heartburn, melena, nausea and vomiting.  Genitourinary: Negative for dysuria, frequency and urgency.  Musculoskeletal: Positive for back pain and joint pain.  Skin: Negative.  Negative for itching and rash.  Neurological: Negative for dizziness, tingling, focal weakness, weakness and headaches.  Endo/Heme/Allergies: Does not bruise/bleed easily.  Psychiatric/Behavioral: Negative for depression. The patient is not nervous/anxious and does not have insomnia.       PAST MEDICAL HISTORY :  Past Medical History:  Diagnosis Date  . Arthritis   . Cancer of lower-outer quadrant of female breast (  [facility]) 08/06/2011   RIGHT   . Diabetes mellitus ([facility]) 03/24/2012  . High cholesterol    . History of kidney stones   . Hx of bilateral breast implants   . Hypertension   . PONV (postoperative nausea and vomiting)   . Type II diabetes mellitus ([facility])    fasting 140-150  . Vertigo    LAST WEEK  . Vertigo 01/2017    PAST SURGICAL HISTORY :   Past Surgical History:  Procedure Laterality Date  . BREAST BIOPSY  07/2011   right  . BREAST RECONSTRUCTION  03/07/2012   Procedure: BREAST RECONSTRUCTION;  Surgeon: Crissie Reese, MD;  Location: [place];  Service: Plastics;  Laterality: Left;  BREAST RECONSTRUCTION WITH PLACEMENT OF TISSUE EXPANDER TO LEFT BREAST  . BREAST RECONSTRUCTION  02/06/2013  . CESAREAN SECTION  [id number]  . ENDOBRONCHIAL ULTRASOUND N/A 02/24/2017   Procedure: ENDOBRONCHIAL ULTRASOUND;  Surgeon: [name], MD;  Location: [facility];  Service: Cardiopulmonary;  Laterality: N/A;  . LATISSIMUS FLAP TO BREAST Right 02/06/2013   Procedure: LATISSIMUS FLAP TO RIGHT BREAST WITH IMPLANT;  Surgeon: Crissie Reese, MD;  Location: [place];  Service: Plastics;  Laterality: Right;  . MASTECTOMY  03/07/12   modified right; total left  . MODIFIED MASTECTOMY  03/07/2012   Procedure: MODIFIED MASTECTOMY;  Surgeon: Haywood Lasso, MD;  Location: [place];  Service: General;  Laterality: Right;  . PORTACATH PLACEMENT  07/2011  . SCAR REVISION  03/30/2012   Procedure: SCAR REVISION;  Surgeon: Haywood Lasso, MD;  Location: [place];  Service: General;  Laterality: Right;  CLOSURE OF MASTECTOMY INCISION  . WISDOM TOOTH EXTRACTION      FAMILY HISTORY :   Family History  Problem Relation Age of Onset  . Cancer Mother        breast    SOCIAL HISTORY:   Social History   Tobacco Use  . Smoking status: Never Smoker  . Smokeless tobacco: Never Used  Substance Use Topics  . Alcohol use: No  . Drug use: No    ALLERGIES:  is allergic to sulfa antibiotics.  MEDICATIONS:  Current Outpatient Medications  Medication Sig Dispense Refill  . apixaban (ELIQUIS) 5 MG  TABS tablet Take 1 tablet (5 mg total) by mouth 2 (two) times daily. 60 tablet 3  . aspirin (ASPIRIN CHILDRENS) 81 MG chewable tablet Chew 1 tablet (81 mg total) by mouth daily. 120 tablet 0  . atorvastatin (LIPITOR) 20 MG tablet Take 20 mg by mouth every morning.    . Calcium Carbonate-Vitamin D (CALCIUM 600 + D PO) Take 1 tablet by mouth 2 (two) times daily.    . fulvestrant (FASLODEX) 250 MG/5ML injection Inject into the muscle every 30 (thirty) days. One injection each buttock over 1-2 minutes. Warm prior to use.    Marland Kitchen glipiZIDE (GLUCOTROL) 10 MG tablet Take 10 mg by mouth daily before breakfast.    . levothyroxine (SYNTHROID, LEVOTHROID) 25 MCG tablet TAKE 1 TABLET (25 MCG TOTAL) BY MOUTH DAILY BEFORE BREAKFAST. 30 tablet 2  . lisinopril (PRINIVIL,ZESTRIL) 10 MG tablet Take 10 mg by mouth every morning.     . loperamide (IMODIUM A-D) 2 MG tablet Take 2 mg by mouth 4 (four) times daily as needed for diarrhea or loose stools.    . ondansetron (ZOFRAN) 8 MG tablet Take 1 tablet (8 mg total) every 8 (eight) hours as needed by mouth for nausea or vomiting. 20 tablet 3  . pantoprazole (PROTONIX) 40  MG tablet Take 1 tablet (40 mg total) by mouth daily. 30 mins prior to meals; On empty stomach 30 tablet 0  . prochlorperazine (COMPAZINE) 10 MG tablet Take 1 tablet (10 mg total) by mouth every 6 (six) hours as needed for nausea or vomiting. 30 tablet 0  . Semaglutide (OZEMPIC) 0.25 or 0.5 MG/DOSE SOPN Inject 0.25 mg into the skin once a week.    Marland Kitchen VERZENIO 100 MG tablet TAKE 1 TABLET (100 MG TOTAL) BY MOUTH 2 (TWO) TIMES DAILY. 56 tablet 4   No current facility-administered medications for this visit.     PHYSICAL EXAMINATION: ECOG PERFORMANCE STATUS: 1 - Symptomatic but completely ambulatory  BP 120/80 (BP Location: Left Arm, Patient Position: Sitting)   Pulse 74   Temp 97.9 F (36.6 C) (Tympanic)   Resp 16   Wt 177 lb (80.3 kg)   BMI 32.37 kg/m   Filed Weights   05/09/18 1354  Weight: 177  lb (80.3 kg)    Physical Exam  Constitutional: She is oriented to person, place, and time and well-developed, well-nourished, and in no distress.  HENT:  Head: Normocephalic and atraumatic.  Mouth/Throat: Oropharynx is clear and moist. No oropharyngeal exudate.  Eyes: Pupils are equal, round, and reactive to light.  Neck: Normal range of motion. Neck supple.  Cardiovascular: Normal rate and regular rhythm.  Pulmonary/Chest: No respiratory distress. She has no wheezes.  Abdominal: Soft. Bowel sounds are normal. She exhibits no distension and no mass. There is no tenderness. There is no rebound and no guarding.  Musculoskeletal: Normal range of motion. She exhibits no edema or tenderness.  Neurological: She is alert and oriented to person, place, and time.  Skin: Skin is warm.  Mild erythema in the groin.  Psychiatric: Affect normal.       LABORATORY DATA:  I have reviewed the data as listed    Component Value Date/Time   NA 133 (L) 05/09/2018 1321   NA 135 11/28/2014 1401   K 4.3 05/09/2018 1321   K 4.7 11/28/2014 1401   CL 97 (L) 05/09/2018 1321   CL 99 (L) 11/28/2014 1401   CO2 23 05/09/2018 1321   CO2 28 11/28/2014 1401   GLUCOSE 234 (H) 05/09/2018 1321   GLUCOSE 215 (H) 11/28/2014 1401   BUN 16 05/09/2018 1321   BUN 16 11/28/2014 1401   CREATININE 1.16 (H) 05/09/2018 1321   CREATININE 0.96 11/28/2014 1401   CALCIUM 9.6 05/09/2018 1321   CALCIUM 9.9 11/28/2014 1401   PROT 7.7 05/09/2018 1321   PROT 7.6 11/28/2014 1401   ALBUMIN 4.0 05/09/2018 1321   ALBUMIN 4.5 11/28/2014 1401   AST 44 (H) 05/09/2018 1321   AST 26 11/28/2014 1401   ALT 27 05/09/2018 1321   ALT 28 11/28/2014 1401   ALKPHOS 53 05/09/2018 1321   ALKPHOS 78 11/28/2014 1401   BILITOT 0.5 05/09/2018 1321   BILITOT 0.5 11/28/2014 1401   GFRNONAA 49 (L) 05/09/2018 1321   GFRNONAA >60 11/28/2014 1401   GFRAA 57 (L) 05/09/2018 1321   GFRAA >60 11/28/2014 1401    No results found for: SPEP,  UPEP  Lab Results  Component Value Date   WBC 3.7 05/09/2018   NEUTROABS 1.9 05/09/2018   HGB 12.5 05/09/2018   HCT 37.2 05/09/2018   MCV 99.7 05/09/2018   PLT 298 05/09/2018      Chemistry      Component Value Date/Time   NA 133 (L) 05/09/2018 1321   NA 135  11/28/2014 1401   K 4.3 05/09/2018 1321   K 4.7 11/28/2014 1401   CL 97 (L) 05/09/2018 1321   CL 99 (L) 11/28/2014 1401   CO2 23 05/09/2018 1321   CO2 28 11/28/2014 1401   BUN 16 05/09/2018 1321   BUN 16 11/28/2014 1401   CREATININE 1.16 (H) 05/09/2018 1321   CREATININE 0.96 11/28/2014 1401      Component Value Date/Time   CALCIUM 9.6 05/09/2018 1321   CALCIUM 9.9 11/28/2014 1401   ALKPHOS 53 05/09/2018 1321   ALKPHOS 78 11/28/2014 1401   AST 44 (H) 05/09/2018 1321   AST 26 11/28/2014 1401   ALT 27 05/09/2018 1321   ALT 28 11/28/2014 1401   BILITOT 0.5 05/09/2018 1321   BILITOT 0.5 11/28/2014 1401       RADIOGRAPHIC STUDIES: I have personally reviewed the radiological images as listed and agreed with the findings in the report. No results found.   ASSESSMENT & PLAN:  Carcinoma of lower-outer quadrant of right breast in female, estrogen receptor positive ([place]) #Recurrent breast cancer-ER positive PR negative ? HER-2/neu-currently on Faslodex plus abema. Jun 28th 2019- CTA- NEG- stable/pre-carinal LN. July 2019- CT A/P-NEG. stable.  #Continue Faslodex plus abema; tolerating well no evidence of progression.  We will plan repeat imaging again November December 2019.  # RIGHT UE DVT- on eliquis- slightly improved.   # CKD-II; creatinine 1.2 stable.  Monitor closely.   #Tinea cruris-on jock itch powder/keeping the area dry; improved;pt will call us if not resolved re: terbinafine.   # follow up in 4 weeks/cbc/cmp/ca-27-29; faslodex. Imaging again 4-6 months.    Orders Placed This Encounter  Procedures  . CBC with Differential    Standing Status:   Future    Standing Expiration Date:   05/10/2019  .  Comprehensive metabolic panel    Standing Status:   Future    Standing Expiration Date:   05/10/2019  . Cancer antigen 27.29    Standing Status:   Future    Standing Expiration Date:   05/10/2019   All questions were answered. The patient knows to call the clinic with any problems, questions or concerns.      [name], MD 05/09/2018 2:35 PM

## 2018-05-09 NOTE — Telephone Encounter (Signed)
MD to evaluate the patient today in the clinic before RF

## 2018-05-09 NOTE — Assessment & Plan Note (Addendum)
#  Recurrent breast cancer-ER positive PR negative ? HER-2/neu-currently on Faslodex plus abema. Jun 28th 2019- CTA- NEG- stable/pre-carinal LN. July 2019- CT A/P-NEG. stable.  #Continue Faslodex plus abema; tolerating well no evidence of progression.  We will plan repeat imaging again November December 2019.  # RIGHT UE DVT- on eliquis- slightly improved.   # CKD-II; creatinine 1.2 stable.  Monitor closely.   #Tinea cruris-on jock itch powder/keeping the area dry; improved;pt will call us if not resolved re: terbinafine.   # follow up in 4 weeks/cbc/cmp/ca-27-29; faslodex. Imaging again 4-6 months.

## 2018-05-10 LAB — CANCER ANTIGEN 27.29: CA 27.29: 35.8 U/mL (ref 0.0–38.6)

## 2018-05-11 MED FILL — VERZENIO 100 MG TAB: 100 | 28 days supply | Qty: 56 | Fill #0

## 2018-06-01 ENCOUNTER — Encounter: Payer: Self-pay | Admitting: Registered Nurse

## 2018-06-01 ENCOUNTER — Telehealth: Payer: Self-pay | Admitting: Registered Nurse

## 2018-06-01 DIAGNOSIS — N181 Chronic kidney disease, stage 1: Principal | ICD-10-CM

## 2018-06-01 DIAGNOSIS — E1122 Type 2 diabetes mellitus with diabetic chronic kidney disease: Secondary | ICD-10-CM

## 2018-06-01 NOTE — Telephone Encounter (Signed)
Patient seen for quarterly at [facility] clinic for her diabetes.  HgbA1C completed  With [facility] along with [facility].  Discussed with patient due to oncology monthly blood draws recommended she bring paper Rx from me for next HgbA1c draw in 3 months with her oncology labs (on chart at [place] [facility]).  I also stated I would enter electronic order for her in Epic under a different [place] clinic that was available to me.  [facility] utilizes only paper charts.  Patient following up with oncologist next week.  GFR 49 and Cr 1.16 stable from previous month but has not returned to Jun 2019 or earlier baseline.  Discussed with patient to avoid OTC meds and glipizide dosing or oncology medications may required dosing adjustment due to renal function and she is to discuss this with her provider. Hydrate to keep urine pale clear and yellow tinged avoid dehydration.  Patient verbalized understanding information/instructions, agreed with plan of care and had no further questions at this time.

## 2018-06-02 ENCOUNTER — Other Ambulatory Visit: Payer: Self-pay | Admitting: *Deleted

## 2018-06-02 DIAGNOSIS — E1122 Type 2 diabetes mellitus with diabetic chronic kidney disease: Secondary | ICD-10-CM

## 2018-06-02 DIAGNOSIS — Z13220 Encounter for screening for lipoid disorders: Secondary | ICD-10-CM

## 2018-06-02 DIAGNOSIS — N181 Chronic kidney disease, stage 1: Principal | ICD-10-CM

## 2018-06-06 ENCOUNTER — Inpatient Hospital Stay: Payer: BLUE CROSS/BLUE SHIELD | Attending: Internal Medicine | Admitting: Internal Medicine

## 2018-06-06 ENCOUNTER — Inpatient Hospital Stay: Payer: BLUE CROSS/BLUE SHIELD

## 2018-06-06 VITALS — BP 120/82 | HR 91 | Temp 97.1°F | Resp 16

## 2018-06-06 DIAGNOSIS — Z170 Estrogen receptor positive status [ER+]: Secondary | ICD-10-CM | POA: Diagnosis not present

## 2018-06-06 DIAGNOSIS — Z5111 Encounter for antineoplastic chemotherapy: Secondary | ICD-10-CM | POA: Diagnosis not present

## 2018-06-06 DIAGNOSIS — Z79899 Other long term (current) drug therapy: Secondary | ICD-10-CM | POA: Insufficient documentation

## 2018-06-06 DIAGNOSIS — C50511 Malignant neoplasm of lower-outer quadrant of right female breast: Secondary | ICD-10-CM

## 2018-06-06 DIAGNOSIS — R5383 Other fatigue: Secondary | ICD-10-CM

## 2018-06-06 DIAGNOSIS — R197 Diarrhea, unspecified: Secondary | ICD-10-CM

## 2018-06-06 DIAGNOSIS — N182 Chronic kidney disease, stage 2 (mild): Secondary | ICD-10-CM | POA: Diagnosis not present

## 2018-06-06 DIAGNOSIS — Z7901 Long term (current) use of anticoagulants: Secondary | ICD-10-CM | POA: Insufficient documentation

## 2018-06-06 DIAGNOSIS — B356 Tinea cruris: Secondary | ICD-10-CM | POA: Diagnosis not present

## 2018-06-06 DIAGNOSIS — I82621 Acute embolism and thrombosis of deep veins of right upper extremity: Secondary | ICD-10-CM | POA: Diagnosis not present

## 2018-06-06 LAB — CBC WITH DIFFERENTIAL/PLATELET
BASOS ABS: 0.1 10*3/uL (ref 0–0.1)
BASOS PCT: 3 %
EOS ABS: 0.1 10*3/uL (ref 0–0.7)
EOS PCT: 2 %
HCT: 33.7 % — ABNORMAL LOW (ref 35.0–47.0)
Hemoglobin: 11.6 g/dL — ABNORMAL LOW (ref 12.0–16.0)
Lymphocytes Relative: 40 %
Lymphs Abs: 1.5 10*3/uL (ref 1.0–3.6)
MCH: 33.7 pg (ref 26.0–34.0)
MCHC: 34.5 g/dL (ref 32.0–36.0)
MCV: 97.8 fL (ref 80.0–100.0)
MONO ABS: 0.2 10*3/uL (ref 0.2–0.9)
Monocytes Relative: 5 %
Neutro Abs: 1.9 10*3/uL (ref 1.4–6.5)
Neutrophils Relative %: 50 %
Platelets: 274 10*3/uL (ref 150–440)
RBC: 3.45 MIL/uL — AB (ref 3.80–5.20)
RDW: 13.8 % (ref 11.5–14.5)
WBC: 3.9 10*3/uL (ref 3.6–11.0)

## 2018-06-06 LAB — COMPREHENSIVE METABOLIC PANEL
ALK PHOS: 56 U/L (ref 38–126)
ALT: 23 U/L (ref 0–44)
AST: 37 U/L (ref 15–41)
Albumin: 3.7 g/dL (ref 3.5–5.0)
Anion gap: 11 (ref 5–15)
BILIRUBIN TOTAL: 0.6 mg/dL (ref 0.3–1.2)
BUN: 15 mg/dL (ref 8–23)
CALCIUM: 9.3 mg/dL (ref 8.9–10.3)
CO2: 22 mmol/L (ref 22–32)
CREATININE: 1.2 mg/dL — AB (ref 0.44–1.00)
Chloride: 100 mmol/L (ref 98–111)
GFR calc Af Amer: 55 mL/min — ABNORMAL LOW (ref >60)
GFR, EST NON AFRICAN AMERICAN: 47 mL/min — AB (ref >60)
GLUCOSE: 203 mg/dL — AB (ref 70–99)
Potassium: 4.3 mmol/L (ref 3.5–5.1)
Sodium: 133 mmol/L — ABNORMAL LOW (ref 135–145)
TOTAL PROTEIN: 7.4 g/dL (ref 6.5–8.1)

## 2018-06-06 MED ORDER — TERBINAFINE HCL 250 MG PO TABS: 250.0000 mg | ORAL_TABLET | Freq: Every day | ORAL | 0 refills | Status: DC

## 2018-06-06 MED ORDER — FULVESTRANT 250 MG/5ML IM SOLN
500.0000 mg | Freq: Once | INTRAMUSCULAR | Status: AC
  Administered 2018-06-06: 500 mg via INTRAMUSCULAR

## 2018-06-06 NOTE — Progress Notes (Signed)
.[facility] OFFICE PROGRESS NOTE  Patient Care Team: Karen Kitchens, MD as PCP - General (Family Medicine) Neldon Mc, MD (General Surgery) Everlene Farrier, MD (Obstetrics and Gynecology) Noreene Filbert, MD Forest Gleason, MD (Inactive) (Unknown Physician Specialty)  Cancer Staging Carcinoma of lower-outer quadrant of right breast in female, estrogen receptor positive [facility]) Staging form: Breast, AJCC 7th Edition - Pathologic: ypT1c,ypN2a, MX - Signed by Haywood Lasso, MD on 03/10/2012    Oncology History   # DEC 2012- RIGHT BREAST CA T2 N2 M0 tumor from biopsy.  Estrogen receptor positive, Progesterone receptor positive.  Current receptor negative by FISH 2. Neoadjuvant chemotherapy started in December of 28 with Cytoxan Adriamycin 3. Started on Taxol weekly chemotherapy. 4. Patient finished 12  cycles of Taxol chemotherapy in May of 2013.     5. Status post right modified radical mastectomy [Dr.Bowers; [place]] June of 2013, ypT1c  yp N2  MO. started also on Lerazole    7. Radiation therapy to the right breast (September of 2013).  Lymph node was positive for HER-2 receptor gene amplification of 2.22.  Will proceed with Herceptin treatment starting in September of 2013.   8.Patient has finished Herceptin (maintenance therapy) in August of 2014 8. Start patient on letrozole from November, 2013. 9. Patient started on Herceptin in September 2013.   10Patient finished 12 months of Herceptin therapy on August, 2014  # 6. Status post left side prophylactic mastectomy.  #Late MAY 2018-RECURRENCE BREAST CA- ER positive/PR negative; ?? HER-2/neu- [biopsy- proven-mediastinal lymph node; in suff for her 2 testing].  [elevated Tumor marker- CT/PET- uptake in Right Mediastinal LN; Sternum [June 2018 EBUS- Dr.Kasa]   # March 10 2017- faslodex + Abema; OCT 5th CT-PR of mediastinal LN --------------------------------------------    DIAGNOSIS: [ BREAST CANCER-  ER/PR/HER2 NEU POS  STAGE:  4   ;GOALS: PALLIATIVE  CURRENT/MOST RECENT THERAPY - ABEMA + FASLODEX      Carcinoma of lower-outer quadrant of right breast in female, estrogen receptor positive ([place])   INTERVAL HISTORY:  Kimberly [name] 62 y.o.  female pleasant patient above history of metastatic ER PR positive HER-2/neu positive breast cancer currently on abema+ Faslodex is here for follow-up.  Patient continues to have mild swelling in the right upper upper extremity.  Denies any significant pain.  She continues to be on Eliquis.  Patient denies any bleeding; denies any nausea vomiting or shortness of breath.  Continues to have mild diarrhea.  Mild fatigue.  Continues to have rash in the inguinal inframammary area.  Review of Systems  Constitutional: Positive for malaise/fatigue. Negative for chills, diaphoresis, fever and weight loss.  HENT: Negative for nosebleeds and sore throat.   Eyes: Negative for double vision.  Respiratory: Negative for cough, hemoptysis, sputum production, shortness of breath and wheezing.   Cardiovascular: Negative for chest pain, palpitations, orthopnea and leg swelling.  Gastrointestinal: Positive for diarrhea. Negative for abdominal pain, blood in stool, constipation, heartburn, melena, nausea and vomiting.  Genitourinary: Negative for dysuria, frequency and urgency.  Musculoskeletal: Positive for back pain and joint pain.  Skin: Negative.  Negative for itching and rash.  Neurological: Negative for dizziness, tingling, focal weakness, weakness and headaches.  Endo/Heme/Allergies: Does not bruise/bleed easily.  Psychiatric/Behavioral: Negative for depression. The patient is not nervous/anxious and does not have insomnia.       PAST MEDICAL HISTORY :  Past Medical History:  Diagnosis Date  . Arthritis   . Cancer of lower-outer quadrant of female breast (  [place]) 08/06/2011   RIGHT   . Diabetes mellitus ([place]) 03/24/2012  . High cholesterol   .  History of kidney stones   . Hx of bilateral breast implants   . Hypertension   . PONV (postoperative nausea and vomiting)   . Type II diabetes mellitus ([facility])    fasting 140-150  . Vertigo    LAST WEEK  . Vertigo 01/2017    PAST SURGICAL HISTORY :   Past Surgical History:  Procedure Laterality Date  . BREAST BIOPSY  07/2011   right  . BREAST RECONSTRUCTION  03/07/2012   Procedure: BREAST RECONSTRUCTION;  Surgeon: Crissie Reese, MD;  Location: [place];  Service: Plastics;  Laterality: Left;  BREAST RECONSTRUCTION WITH PLACEMENT OF TISSUE EXPANDER TO LEFT BREAST  . BREAST RECONSTRUCTION  02/06/2013  . CESAREAN SECTION  [id number]  . ENDOBRONCHIAL ULTRASOUND N/A 02/24/2017   Procedure: ENDOBRONCHIAL ULTRASOUND;  Surgeon: [name], MD;  Location: [facility];  Service: Cardiopulmonary;  Laterality: N/A;  . LATISSIMUS FLAP TO BREAST Right 02/06/2013   Procedure: LATISSIMUS FLAP TO RIGHT BREAST WITH IMPLANT;  Surgeon: Crissie Reese, MD;  Location: [place];  Service: Plastics;  Laterality: Right;  . MASTECTOMY  03/07/12   modified right; total left  . MODIFIED MASTECTOMY  03/07/2012   Procedure: MODIFIED MASTECTOMY;  Surgeon: Haywood Lasso, MD;  Location: [place];  Service: General;  Laterality: Right;  . PORTACATH PLACEMENT  07/2011  . SCAR REVISION  03/30/2012   Procedure: SCAR REVISION;  Surgeon: Haywood Lasso, MD;  Location: [place];  Service: General;  Laterality: Right;  CLOSURE OF MASTECTOMY INCISION  . WISDOM TOOTH EXTRACTION      FAMILY HISTORY :   Family History  Problem Relation Age of Onset  . Cancer Mother        breast    SOCIAL HISTORY:   Social History   Tobacco Use  . Smoking status: Never Smoker  . Smokeless tobacco: Never Used  Substance Use Topics  . Alcohol use: No  . Drug use: No    ALLERGIES:  is allergic to sulfa antibiotics.  MEDICATIONS:  Current Outpatient Medications  Medication Sig Dispense Refill  . apixaban (ELIQUIS) 5 MG TABS  tablet Take 1 tablet (5 mg total) by mouth 2 (two) times daily. 60 tablet 3  . aspirin (ASPIRIN CHILDRENS) 81 MG chewable tablet Chew 1 tablet (81 mg total) by mouth daily. 120 tablet 0  . atorvastatin (LIPITOR) 20 MG tablet Take 20 mg by mouth every morning.    . Calcium Carbonate-Vitamin D (CALCIUM 600 + D PO) Take 1 tablet by mouth 2 (two) times daily.    . fulvestrant (FASLODEX) 250 MG/5ML injection Inject into the muscle every 30 (thirty) days. One injection each buttock over 1-2 minutes. Warm prior to use.    Marland Kitchen glipiZIDE (GLUCOTROL XL) 10 MG 24 hr tablet Take 10 mg by mouth 2 (two) times daily.  11  . levothyroxine (SYNTHROID, LEVOTHROID) 25 MCG tablet TAKE 1 TABLET (25 MCG TOTAL) BY MOUTH DAILY BEFORE BREAKFAST. 30 tablet 2  . lisinopril (PRINIVIL,ZESTRIL) 10 MG tablet Take 10 mg by mouth every morning.     . loperamide (IMODIUM A-D) 2 MG tablet Take 2 mg by mouth 4 (four) times daily as needed for diarrhea or loose stools.    . ondansetron (ZOFRAN) 8 MG tablet Take 1 tablet (8 mg total) every 8 (eight) hours as needed by mouth for nausea or vomiting. 20 tablet 3  .  pantoprazole (PROTONIX) 40 MG tablet Take 1 tablet (40 mg total) by mouth daily. 30 mins prior to meals; On empty stomach 30 tablet 0  . prochlorperazine (COMPAZINE) 10 MG tablet Take 1 tablet (10 mg total) by mouth every 6 (six) hours as needed for nausea or vomiting. 30 tablet 0  . Semaglutide (OZEMPIC) 0.25 or 0.5 MG/DOSE SOPN Inject 0.25 mg into the skin once a week.    Marland Kitchen VERZENIO 100 MG tablet TAKE 1 TABLET (100 MG TOTAL) BY MOUTH 2 (TWO) TIMES DAILY. 56 tablet 4  . terbinafine (LAMISIL) 250 MG tablet Take 1 tablet (250 mg total) by mouth daily. 14 tablet 0   No current facility-administered medications for this visit.     PHYSICAL EXAMINATION: ECOG PERFORMANCE STATUS: 1 - Symptomatic but completely ambulatory  BP 120/82 (BP Location: Left Arm, Patient Position: Sitting)   Pulse 91   Temp (!) 97.1 F (36.2 C)  (Tympanic)   Resp 16   There were no vitals filed for this visit.  Physical Exam  Constitutional: She is oriented to person, place, and time and well-developed, well-nourished, and in no distress.  HENT:  Head: Normocephalic and atraumatic.  Mouth/Throat: Oropharynx is clear and moist. No oropharyngeal exudate.  Eyes: Pupils are equal, round, and reactive to light.  Neck: Normal range of motion. Neck supple.  Cardiovascular: Normal rate and regular rhythm.  Pulmonary/Chest: No respiratory distress. She has no wheezes.  Abdominal: Soft. Bowel sounds are normal. She exhibits no distension and no mass. There is no tenderness. There is no rebound and no guarding.  Musculoskeletal: Normal range of motion. She exhibits no edema or tenderness.  Neurological: She is alert and oriented to person, place, and time.  Skin: Skin is warm.  Mild erythema in the groin.  Psychiatric: Affect normal.       LABORATORY DATA:  I have reviewed the data as listed    Component Value Date/Time   NA 133 (L) 06/06/2018 0839   NA 135 11/28/2014 1401   K 4.3 06/06/2018 0839   K 4.7 11/28/2014 1401   CL 100 06/06/2018 0839   CL 99 (L) 11/28/2014 1401   CO2 22 06/06/2018 0839   CO2 28 11/28/2014 1401   GLUCOSE 203 (H) 06/06/2018 0839   GLUCOSE 215 (H) 11/28/2014 1401   BUN 15 06/06/2018 0839   BUN 16 11/28/2014 1401   CREATININE 1.20 (H) 06/06/2018 0839   CREATININE 0.96 11/28/2014 1401   CALCIUM 9.3 06/06/2018 0839   CALCIUM 9.9 11/28/2014 1401   PROT 7.4 06/06/2018 0839   PROT 7.6 11/28/2014 1401   ALBUMIN 3.7 06/06/2018 0839   ALBUMIN 4.5 11/28/2014 1401   AST 37 06/06/2018 0839   AST 26 11/28/2014 1401   ALT 23 06/06/2018 0839   ALT 28 11/28/2014 1401   ALKPHOS 56 06/06/2018 0839   ALKPHOS 78 11/28/2014 1401   BILITOT 0.6 06/06/2018 0839   BILITOT 0.5 11/28/2014 1401   GFRNONAA 47 (L) 06/06/2018 0839   GFRNONAA >60 11/28/2014 1401   GFRAA 55 (L) 06/06/2018 0839   GFRAA >60 11/28/2014  1401    No results found for: SPEP, UPEP  Lab Results  Component Value Date   WBC 3.9 06/06/2018   NEUTROABS 1.9 06/06/2018   HGB 11.6 (L) 06/06/2018   HCT 33.7 (L) 06/06/2018   MCV 97.8 06/06/2018   PLT 274 06/06/2018      Chemistry      Component Value Date/Time   NA 133 (L) 06/06/2018  0839   NA 135 11/28/2014 1401   K 4.3 06/06/2018 0839   K 4.7 11/28/2014 1401   CL 100 06/06/2018 0839   CL 99 (L) 11/28/2014 1401   CO2 22 06/06/2018 0839   CO2 28 11/28/2014 1401   BUN 15 06/06/2018 0839   BUN 16 11/28/2014 1401   CREATININE 1.20 (H) 06/06/2018 0839   CREATININE 0.96 11/28/2014 1401      Component Value Date/Time   CALCIUM 9.3 06/06/2018 0839   CALCIUM 9.9 11/28/2014 1401   ALKPHOS 56 06/06/2018 0839   ALKPHOS 78 11/28/2014 1401   AST 37 06/06/2018 0839   AST 26 11/28/2014 1401   ALT 23 06/06/2018 0839   ALT 28 11/28/2014 1401   BILITOT 0.6 06/06/2018 0839   BILITOT 0.5 11/28/2014 1401       RADIOGRAPHIC STUDIES: I have personally reviewed the radiological images as listed and agreed with the findings in the report. No results found.   ASSESSMENT & PLAN:  Carcinoma of lower-outer quadrant of right breast in female, estrogen receptor positive ([place]) #Recurrent breast cancer-ER positive PR negative ? HER-2/neu-currently on Faslodex plus abema. Jun 28th 2019- CTA- NEG- stable/pre-carinal LN. July 2019- CT A/P-NEG. stable.  #Continue Faslodex plus abema; tolerating well no evidence of progression.  Will order CT today; prior to next visit.   # RIGHT UE DVT- on eliquis- STABLE; not resolved- discussed re: lymphedema; if not improved- then physical therapy.   # CKD-II; creatinine 1.2; STABLE.   #Tinea cruris-on jock itch powder/keeping the area dry;not improved; start terbonafine.  DISPOSITION:  # faslodex today #  follow up in 4 weeks/cbc/cmp/ca-27-29; faslodex.CT scan prior   Orders Placed This Encounter  Procedures  . CT CHEST W CONTRAST    Standing  Status:   Future    Standing Expiration Date:   06/07/2019    Order Specific Question:   If indicated for the ordered procedure, I authorize the administration of contrast media per Radiology protocol    Answer:   Yes    Order Specific Question:   Preferred imaging location?    Answer:   [facility]    Order Specific Question:   Radiology Contrast Protocol - do NOT remove file path    Answer:   \\[email or web address]    Order Specific Question:   ** REASON FOR EXAM (FREE TEXT)    Answer:   breast cancer  . CT Abdomen Pelvis W Contrast    Standing Status:   Future    Standing Expiration Date:   06/06/2019    Order Specific Question:   ** REASON FOR EXAM (FREE TEXT)    Answer:   Breast cancer    Order Specific Question:   If indicated for the ordered procedure, I authorize the administration of contrast media per Radiology protocol    Answer:   Yes    Order Specific Question:   Preferred imaging location?    Answer:   [facility]    Order Specific Question:   Is Oral Contrast requested for this exam?    Answer:   Yes, Per Radiology protocol    Order Specific Question:   Radiology Contrast Protocol - do NOT remove file path    Answer:   \\[email or web address]   All questions were answered. The patient knows to call the clinic with any problems, questions or concerns.      [name], MD 06/09/2018 10:56 PM

## 2018-06-06 NOTE — Patient Instructions (Signed)

## 2018-06-06 NOTE — Assessment & Plan Note (Addendum)
#  Recurrent breast cancer-ER positive PR negative ? HER-2/neu-currently on Faslodex plus abema. Jun 28th 2019- CTA- NEG- stable/pre-carinal LN. July 2019- CT A/P-NEG. stable.  #Continue Faslodex plus abema; tolerating well no evidence of progression.  Will order CT today; prior to next visit.   # RIGHT UE DVT- on eliquis- STABLE; not resolved- discussed re: lymphedema; if not improved- then physical therapy.   # CKD-II; creatinine 1.2; STABLE.   #Tinea cruris-on jock itch powder/keeping the area dry;not improved; start terbonafine.  DISPOSITION:  # faslodex today #  follow up in 4 weeks/cbc/cmp/ca-27-29; faslodex.CT scan prior

## 2018-06-07 LAB — CANCER ANTIGEN 27.29: CAN 27.29: 28.4 U/mL (ref 0.0–38.6)

## 2018-06-08 MED FILL — VERZENIO 100 MG TAB: 100 | 28 days supply | Qty: 56 | Fill #1

## 2018-06-09 ENCOUNTER — Telehealth: Payer: Self-pay | Admitting: Internal Medicine

## 2018-06-09 NOTE — Telephone Encounter (Signed)
Kimberly [name]/Kimberly [name] please inform patient that her tumor marker is 28/improving-continue follow-up as planned.  No new recommendations.

## 2018-06-12 NOTE — Telephone Encounter (Signed)
Patient contacted me on Thursday last week and these labs were reviewed with the patient at that time.

## 2018-06-28 ENCOUNTER — Telehealth: Payer: Self-pay | Admitting: Internal Medicine

## 2018-06-28 ENCOUNTER — Ambulatory Visit
Admission: RE | Admit: 2018-06-28 | Discharge: 2018-06-28 | Disposition: A | Discharge status: Home / Self Care | Payer: BLUE CROSS/BLUE SHIELD | Source: Ambulatory Visit | Attending: Internal Medicine | Admitting: Internal Medicine

## 2018-06-28 DIAGNOSIS — C50511 Malignant neoplasm of lower-outer quadrant of right female breast: Secondary | ICD-10-CM | POA: Insufficient documentation

## 2018-06-28 DIAGNOSIS — K760 Fatty (change of) liver, not elsewhere classified: Secondary | ICD-10-CM | POA: Insufficient documentation

## 2018-06-28 DIAGNOSIS — K8020 Calculus of gallbladder without cholecystitis without obstruction: Secondary | ICD-10-CM | POA: Diagnosis not present

## 2018-06-28 DIAGNOSIS — N281 Cyst of kidney, acquired: Secondary | ICD-10-CM | POA: Diagnosis not present

## 2018-06-28 DIAGNOSIS — I700 Atherosclerosis of aorta: Secondary | ICD-10-CM | POA: Insufficient documentation

## 2018-06-28 DIAGNOSIS — I2510 Atherosclerotic heart disease of native coronary artery without angina pectoris: Secondary | ICD-10-CM | POA: Diagnosis not present

## 2018-06-28 DIAGNOSIS — Z170 Estrogen receptor positive status [ER+]: Secondary | ICD-10-CM | POA: Insufficient documentation

## 2018-06-28 MED ORDER — IOHEXOL 300 MG/ML  SOLN
100.0000 mL | Freq: Once | INTRAMUSCULAR | Status: AC | PRN
  Administered 2018-06-28: 100 mL via INTRAVENOUS

## 2018-06-28 NOTE — Telephone Encounter (Signed)
I contacted patient and advised her of these results. Patient verbalized understanding and thanked me for calling.

## 2018-06-28 NOTE — Telephone Encounter (Signed)
Kimberly [name] - please inform patient that her CT scan-looks stable no evidence of any cancer getting worse./Follow-up as planned we will discuss more at next visit.  Thank you

## 2018-06-29 ENCOUNTER — Other Ambulatory Visit: Payer: Self-pay | Admitting: Internal Medicine

## 2018-06-29 DIAGNOSIS — Z170 Estrogen receptor positive status [ER+]: Principal | ICD-10-CM

## 2018-06-29 DIAGNOSIS — C50511 Malignant neoplasm of lower-outer quadrant of right female breast: Secondary | ICD-10-CM

## 2018-06-30 ENCOUNTER — Telehealth: Payer: Self-pay | Admitting: Pharmacist

## 2018-06-30 NOTE — Telephone Encounter (Signed)
Oral Chemotherapy Pharmacist Encounter  Follow-Up Form  Called patient today to follow up regarding patient's oral chemotherapy medication: Verzenio (abemaciclib)  Original Start date of oral chemotherapy: 02/2017  Pt reports 0 tablets/doses of Verzenio missed in the last month.    Per [facility] patient did not report any side effects or concerns.  Recent labs reviewed: CA 27.29 from 07/04/18  Med list reviewed: No, DDI with Verzenio identified  Darl Pikes, PharmD, BCPS, [facility] Hematology/Oncology Clinical Pharmacist [facility]/HP/AP [facility] [phone]  07/06/2018 10:41 AM

## 2018-07-04 ENCOUNTER — Inpatient Hospital Stay: Payer: BLUE CROSS/BLUE SHIELD

## 2018-07-04 ENCOUNTER — Encounter: Payer: Self-pay | Admitting: Internal Medicine

## 2018-07-04 ENCOUNTER — Inpatient Hospital Stay: Payer: BLUE CROSS/BLUE SHIELD | Attending: Internal Medicine | Admitting: Internal Medicine

## 2018-07-04 VITALS — BP 131/83 | HR 93 | Temp 97.4°F | Resp 16 | Wt 181.2 lb

## 2018-07-04 DIAGNOSIS — C50511 Malignant neoplasm of lower-outer quadrant of right female breast: Secondary | ICD-10-CM | POA: Diagnosis present

## 2018-07-04 DIAGNOSIS — Z5111 Encounter for antineoplastic chemotherapy: Secondary | ICD-10-CM | POA: Insufficient documentation

## 2018-07-04 DIAGNOSIS — B356 Tinea cruris: Secondary | ICD-10-CM | POA: Insufficient documentation

## 2018-07-04 DIAGNOSIS — Z7901 Long term (current) use of anticoagulants: Secondary | ICD-10-CM | POA: Insufficient documentation

## 2018-07-04 DIAGNOSIS — N182 Chronic kidney disease, stage 2 (mild): Secondary | ICD-10-CM | POA: Insufficient documentation

## 2018-07-04 DIAGNOSIS — I82621 Acute embolism and thrombosis of deep veins of right upper extremity: Secondary | ICD-10-CM | POA: Insufficient documentation

## 2018-07-04 DIAGNOSIS — Z170 Estrogen receptor positive status [ER+]: Secondary | ICD-10-CM | POA: Insufficient documentation

## 2018-07-04 LAB — COMPREHENSIVE METABOLIC PANEL
ALK PHOS: 62 U/L (ref 38–126)
ALT: 24 U/L (ref 0–44)
AST: 42 U/L — AB (ref 15–41)
Albumin: 3.6 g/dL (ref 3.5–5.0)
Anion gap: 11 (ref 5–15)
BUN: 15 mg/dL (ref 8–23)
CALCIUM: 8.9 mg/dL (ref 8.9–10.3)
CHLORIDE: 98 mmol/L (ref 98–111)
CO2: 22 mmol/L (ref 22–32)
CREATININE: 1.17 mg/dL — AB (ref 0.44–1.00)
GFR, EST AFRICAN AMERICAN: 56 mL/min — AB (ref >60)
GFR, EST NON AFRICAN AMERICAN: 49 mL/min — AB (ref >60)
Glucose, Bld: 215 mg/dL — ABNORMAL HIGH (ref 70–99)
Potassium: 4.5 mmol/L (ref 3.5–5.1)
SODIUM: 131 mmol/L — AB (ref 135–145)
Total Bilirubin: 0.3 mg/dL (ref 0.3–1.2)
Total Protein: 7.1 g/dL (ref 6.5–8.1)

## 2018-07-04 LAB — CBC WITH DIFFERENTIAL/PLATELET
Abs Immature Granulocytes: 0 10*3/uL (ref 0.00–0.07)
Basophils Absolute: 0.1 10*3/uL (ref 0.0–0.1)
Basophils Relative: 2 %
EOS ABS: 0.1 10*3/uL (ref 0.0–0.5)
EOS PCT: 2 %
HEMATOCRIT: 33.2 % — AB (ref 36.0–46.0)
HEMOGLOBIN: 11.3 g/dL — AB (ref 12.0–15.0)
Immature Granulocytes: 0 %
LYMPHS ABS: 1.3 10*3/uL (ref 0.7–4.0)
LYMPHS PCT: 45 %
MCH: 33.2 pg (ref 26.0–34.0)
MCHC: 34 g/dL (ref 30.0–36.0)
MCV: 97.6 fL (ref 80.0–100.0)
MONOS PCT: 9 %
Monocytes Absolute: 0.3 10*3/uL (ref 0.1–1.0)
NRBC: 0 % (ref 0.0–0.2)
Neutro Abs: 1.2 10*3/uL — ABNORMAL LOW (ref 1.7–7.7)
Neutrophils Relative %: 42 %
Platelets: 270 10*3/uL (ref 150–400)
RBC: 3.4 MIL/uL — ABNORMAL LOW (ref 3.87–5.11)
RDW: 13.4 % (ref 11.5–15.5)
WBC: 2.9 10*3/uL — ABNORMAL LOW (ref 4.0–10.5)

## 2018-07-04 MED ORDER — FULVESTRANT 250 MG/5ML IM SOLN
500.0000 mg | Freq: Once | INTRAMUSCULAR | Status: AC
  Administered 2018-07-04: 500 mg via INTRAMUSCULAR

## 2018-07-04 NOTE — Patient Instructions (Signed)
#  Take Claritin once a day for itch;  #Continue jock itch spray twice a day.

## 2018-07-04 NOTE — Progress Notes (Signed)
.[facility] OFFICE PROGRESS NOTE  Patient Care Team: Karen Kitchens, MD as PCP - General (Family Medicine) Neldon Mc, MD (General Surgery) Everlene Farrier, MD (Obstetrics and Gynecology) Noreene Filbert, MD Forest Gleason, MD (Inactive) (Unknown Physician Specialty)  Cancer Staging Carcinoma of lower-outer quadrant of right breast in female, estrogen receptor positive [facility]) Staging form: Breast, AJCC 7th Edition - Pathologic: ypT1c,ypN2a, MX - Signed by Haywood Lasso, MD on 03/10/2012    Oncology History   # DEC 2012- RIGHT BREAST CA T2 N2 M0 tumor from biopsy.  Estrogen receptor positive, Progesterone receptor positive.  Current receptor negative by FISH 2. Neoadjuvant chemotherapy started in December of 28 with Cytoxan Adriamycin 3. Started on Taxol weekly chemotherapy. 4. Patient finished 12  cycles of Taxol chemotherapy in May of 2013.     5. Status post right modified radical mastectomy [Dr.Bowers; [place]] June of 2013, ypT1c  yp N2  MO. started also on Lerazole    7. Radiation therapy to the right breast (September of 2013).  Lymph node was positive for HER-2 receptor gene amplification of 2.22.  Will proceed with Herceptin treatment starting in September of 2013.   8.Patient has finished Herceptin (maintenance therapy) in August of 2014 8. Start patient on letrozole from November, 2013. 9. Patient started on Herceptin in September 2013.   10Patient finished 12 months of Herceptin therapy on August, 2014  # 6. Status post left side prophylactic mastectomy.  #Late MAY 2018-RECURRENCE BREAST CA- ER positive/PR negative; ?? HER-2/neu- [biopsy- proven-mediastinal lymph node; in suff for her 2 testing].  [elevated Tumor marker- CT/PET- uptake in Right Mediastinal LN; Sternum [June 2018 EBUS- Dr.Kasa]   # March 10 2017- faslodex + Abema; OCT 5th CT-PR of mediastinal LN --------------------------------------------    DIAGNOSIS: [ BREAST CANCER-  ER/PR/HER2 NEU POS  STAGE:  4   ;GOALS: PALLIATIVE  CURRENT/MOST RECENT THERAPY - ABEMA + FASLODEX      Carcinoma of lower-outer quadrant of right breast in female, estrogen receptor positive ([place])   INTERVAL HISTORY:  Kimberly [name] 62 y.o.  female pleasant patient above history of metastatic ER PR positive HER-2/neu positive breast cancer currently on abema+ Faslodex is here for follow-up.  Patient continues to have mild skin rash in her pubic area-even after using terbinafine pills for 14 days.  She has been using " jock itch" spray twice a day; complains of itch.  Mild swelling of the right upper extremity.  Continues to be on Eliquis.  No pain.  No fevers or chills.  Review of Systems  Constitutional: Positive for malaise/fatigue. Negative for chills, diaphoresis, fever and weight loss.  HENT: Negative for nosebleeds and sore throat.   Eyes: Negative for double vision.  Respiratory: Negative for cough, hemoptysis, sputum production, shortness of breath and wheezing.   Cardiovascular: Negative for chest pain, palpitations, orthopnea and leg swelling.  Gastrointestinal: Positive for diarrhea. Negative for abdominal pain, blood in stool, constipation, heartburn, melena, nausea and vomiting.  Genitourinary: Negative for dysuria, frequency and urgency.  Musculoskeletal: Positive for back pain and joint pain.  Skin: Positive for itching and rash.  Neurological: Negative for dizziness, tingling, focal weakness, weakness and headaches.  Endo/Heme/Allergies: Does not bruise/bleed easily.  Psychiatric/Behavioral: Negative for depression. The patient is not nervous/anxious and does not have insomnia.       PAST MEDICAL HISTORY :  Past Medical History:  Diagnosis Date  . Arthritis   . Cancer of lower-outer quadrant of female breast ([place]) 08/06/2011  RIGHT   . Diabetes mellitus ([place]) 03/24/2012  . High cholesterol   . History of kidney stones   . Hx of bilateral breast implants    . Hypertension   . PONV (postoperative nausea and vomiting)   . Type II diabetes mellitus ([facility])    fasting 140-150  . Vertigo    LAST WEEK  . Vertigo 01/2017    PAST SURGICAL HISTORY :   Past Surgical History:  Procedure Laterality Date  . BREAST BIOPSY  07/2011   right  . BREAST RECONSTRUCTION  03/07/2012   Procedure: BREAST RECONSTRUCTION;  Surgeon: Crissie Reese, MD;  Location: [facility];  Service: Plastics;  Laterality: Left;  BREAST RECONSTRUCTION WITH PLACEMENT OF TISSUE EXPANDER TO LEFT BREAST  . BREAST RECONSTRUCTION  02/06/2013  . CESAREAN SECTION  [id number]  . ENDOBRONCHIAL ULTRASOUND N/A 02/24/2017   Procedure: ENDOBRONCHIAL ULTRASOUND;  Surgeon: [name], MD;  Location: [facility];  Service: Cardiopulmonary;  Laterality: N/A;  . LATISSIMUS FLAP TO BREAST Right 02/06/2013   Procedure: LATISSIMUS FLAP TO RIGHT BREAST WITH IMPLANT;  Surgeon: Crissie Reese, MD;  Location: [facility];  Service: Plastics;  Laterality: Right;  . MASTECTOMY  03/07/12   modified right; total left  . MODIFIED MASTECTOMY  03/07/2012   Procedure: MODIFIED MASTECTOMY;  Surgeon: Haywood Lasso, MD;  Location: [facility];  Service: General;  Laterality: Right;  . PORTACATH PLACEMENT  07/2011  . SCAR REVISION  03/30/2012   Procedure: SCAR REVISION;  Surgeon: Haywood Lasso, MD;  Location: [facility];  Service: General;  Laterality: Right;  CLOSURE OF MASTECTOMY INCISION  . WISDOM TOOTH EXTRACTION      FAMILY HISTORY :   Family History  Problem Relation Age of Onset  . Cancer Mother        breast    SOCIAL HISTORY:   Social History   Tobacco Use  . Smoking status: Never Smoker  . Smokeless tobacco: Never Used  Substance Use Topics  . Alcohol use: No  . Drug use: No    ALLERGIES:  is allergic to sulfa antibiotics.  MEDICATIONS:  Current Outpatient Medications  Medication Sig Dispense Refill  . apixaban (ELIQUIS) 5 MG TABS tablet Take 1 tablet (5 mg total) by mouth 2 (two) times  daily. 60 tablet 3  . aspirin (ASPIRIN CHILDRENS) 81 MG chewable tablet Chew 1 tablet (81 mg total) by mouth daily. 120 tablet 0  . atorvastatin (LIPITOR) 20 MG tablet Take 20 mg by mouth every morning.    . Calcium Carbonate-Vitamin D (CALCIUM 600 + D PO) Take 1 tablet by mouth 2 (two) times daily.    . fulvestrant (FASLODEX) 250 MG/5ML injection Inject into the muscle every 30 (thirty) days. One injection each buttock over 1-2 minutes. Warm prior to use.    Marland Kitchen glipiZIDE (GLUCOTROL XL) 10 MG 24 hr tablet Take 10 mg by mouth 2 (two) times daily.  11  . levothyroxine (SYNTHROID, LEVOTHROID) 25 MCG tablet TAKE 1 TABLET (25 MCG TOTAL) BY MOUTH DAILY BEFORE BREAKFAST. 30 tablet 2  . lisinopril (PRINIVIL,ZESTRIL) 10 MG tablet Take 10 mg by mouth every morning.     . loperamide (IMODIUM A-D) 2 MG tablet Take 2 mg by mouth 4 (four) times daily as needed for diarrhea or loose stools.    . ondansetron (ZOFRAN) 8 MG tablet Take 1 tablet (8 mg total) every 8 (eight) hours as needed by mouth for nausea or vomiting. 20 tablet 3  . pantoprazole (PROTONIX) 40  MG tablet Take 1 tablet (40 mg total) by mouth daily. 30 mins prior to meals; On empty stomach 30 tablet 0  . prochlorperazine (COMPAZINE) 10 MG tablet Take 1 tablet (10 mg total) by mouth every 6 (six) hours as needed for nausea or vomiting. 30 tablet 0  . Semaglutide (OZEMPIC) 0.25 or 0.5 MG/DOSE SOPN Inject 0.25 mg into the skin once a week.    . terbinafine (LAMISIL) 250 MG tablet Take 1 tablet (250 mg total) by mouth daily. 14 tablet 0  . VERZENIO 100 MG tablet TAKE 1 TABLET (100 MG TOTAL) BY MOUTH 2 (TWO) TIMES DAILY. 56 tablet 4   No current facility-administered medications for this visit.     PHYSICAL EXAMINATION: ECOG PERFORMANCE STATUS: 1 - Symptomatic but completely ambulatory  BP 131/83 (BP Location: Left Arm, Patient Position: Sitting)   Pulse 93   Temp (!) 97.4 F (36.3 C) (Tympanic)   Resp 16   Wt 181 lb 3.5 oz (82.2 kg)   BMI 33.15  kg/m   Filed Weights   07/04/18 0830  Weight: 181 lb 3.5 oz (82.2 kg)    Physical Exam  Constitutional: She is oriented to person, place, and time and well-developed, well-nourished, and in no distress.  Accompanied by husband.  HENT:  Head: Normocephalic and atraumatic.  Mouth/Throat: Oropharynx is clear and moist. No oropharyngeal exudate.  Eyes: Pupils are equal, round, and reactive to light.  Neck: Normal range of motion. Neck supple.  Cardiovascular: Normal rate and regular rhythm.  Pulmonary/Chest: No respiratory distress. She has no wheezes.  Abdominal: Soft. Bowel sounds are normal. She exhibits no distension and no mass. There is no tenderness. There is no rebound and no guarding.  Musculoskeletal: Normal range of motion. She exhibits no edema or tenderness.  Right arm swollen compared to left.  No tenderness no erythema.  Neurological: She is alert and oriented to person, place, and time.  Skin: Skin is warm.  Mild erythema in the groin.  Psychiatric: Affect normal.       LABORATORY DATA:  I have reviewed the data as listed    Component Value Date/Time   NA 131 (L) 07/04/2018 0814   NA 135 11/28/2014 1401   K 4.5 07/04/2018 0814   K 4.7 11/28/2014 1401   CL 98 07/04/2018 0814   CL 99 (L) 11/28/2014 1401   CO2 22 07/04/2018 0814   CO2 28 11/28/2014 1401   GLUCOSE 215 (H) 07/04/2018 0814   GLUCOSE 215 (H) 11/28/2014 1401   BUN 15 07/04/2018 0814   BUN 16 11/28/2014 1401   CREATININE 1.17 (H) 07/04/2018 0814   CREATININE 0.96 11/28/2014 1401   CALCIUM 8.9 07/04/2018 0814   CALCIUM 9.9 11/28/2014 1401   PROT 7.1 07/04/2018 0814   PROT 7.6 11/28/2014 1401   ALBUMIN 3.6 07/04/2018 0814   ALBUMIN 4.5 11/28/2014 1401   AST 42 (H) 07/04/2018 0814   AST 26 11/28/2014 1401   ALT 24 07/04/2018 0814   ALT 28 11/28/2014 1401   ALKPHOS 62 07/04/2018 0814   ALKPHOS 78 11/28/2014 1401   BILITOT 0.3 07/04/2018 0814   BILITOT 0.5 11/28/2014 1401   GFRNONAA 49 (L)  07/04/2018 0814   GFRNONAA >60 11/28/2014 1401   GFRAA 56 (L) 07/04/2018 0814   GFRAA >60 11/28/2014 1401    No results found for: SPEP, UPEP  Lab Results  Component Value Date   WBC 2.9 (L) 07/04/2018   NEUTROABS 1.2 (L) 07/04/2018   HGB 11.3 (  L) 07/04/2018   HCT 33.2 (L) 07/04/2018   MCV 97.6 07/04/2018   PLT 270 07/04/2018      Chemistry      Component Value Date/Time   NA 131 (L) 07/04/2018 0814   NA 135 11/28/2014 1401   K 4.5 07/04/2018 0814   K 4.7 11/28/2014 1401   CL 98 07/04/2018 0814   CL 99 (L) 11/28/2014 1401   CO2 22 07/04/2018 0814   CO2 28 11/28/2014 1401   BUN 15 07/04/2018 0814   BUN 16 11/28/2014 1401   CREATININE 1.17 (H) 07/04/2018 0814   CREATININE 0.96 11/28/2014 1401      Component Value Date/Time   CALCIUM 8.9 07/04/2018 0814   CALCIUM 9.9 11/28/2014 1401   ALKPHOS 62 07/04/2018 0814   ALKPHOS 78 11/28/2014 1401   AST 42 (H) 07/04/2018 0814   AST 26 11/28/2014 1401   ALT 24 07/04/2018 0814   ALT 28 11/28/2014 1401   BILITOT 0.3 07/04/2018 0814   BILITOT 0.5 11/28/2014 1401       RADIOGRAPHIC STUDIES: I have personally reviewed the radiological images as listed and agreed with the findings in the report. No results found.   ASSESSMENT & PLAN:  Carcinoma of lower-outer quadrant of right breast in female, estrogen receptor positive ([place]) #Recurrent breast cancer-ER positive PR negative ? HER-2/neu-currently on Faslodex plus abema. Oct 30th 2019- CT A/P-NEG-STABLE.   #Continue Faslodex plus abema; tolerating well no evidence of progression. Labs today reviewed;  acceptable for treatment today-except ANC 1.2.  Check CBC in 2 weeks.  # RIGHT UE DVT- on eliquis-STABLE;  Wants to hold off lymphedemaphysical therapy for now.   # CKD-II; creatinine 1. 9.  Stable.  #Tinea cruris-on jock itch powder/keeping the area dry;s/p terbonafine; STABLE; not resolved. Claritin; declines derm referral.    DISPOSITION:  # faslodex today # labs- Cbc  in 2 weeks #  follow up in 4 weeks/cbc/cmp/ca-27-29; faslodex- Dr.B   No orders of the defined types were placed in this encounter.  All questions were answered. The patient knows to call the clinic with any problems, questions or concerns.      [name], MD 07/04/2018 9:59 AM

## 2018-07-04 NOTE — Assessment & Plan Note (Addendum)
#  Recurrent breast cancer-ER positive PR negative ? HER-2/neu-currently on Faslodex plus abema. Oct 30th 2019- CT A/P-NEG-STABLE.   #Continue Faslodex plus abema; tolerating well no evidence of progression. Labs today reviewed;  acceptable for treatment today-except ANC 1.2.  Check CBC in 2 weeks.  # RIGHT UE DVT- on eliquis-STABLE;  Wants to hold off lymphedemaphysical therapy for now.   # CKD-II; creatinine 1. 9.  Stable.  #Tinea cruris-on jock itch powder/keeping the area dry;s/p terbonafine; STABLE; not resolved. Claritin; declines derm referral.    DISPOSITION:  # faslodex today # labs- Cbc in 2 weeks #  follow up in 4 weeks/cbc/cmp/ca-27-29; faslodex- Dr.B

## 2018-07-05 LAB — CANCER ANTIGEN 27.29: CA 27.29: 34.9 U/mL (ref 0.0–38.6)

## 2018-07-05 MED FILL — VERZENIO 100 MG TAB: 100 | 28 days supply | Qty: 56 | Fill #2

## 2018-07-10 ENCOUNTER — Telehealth: Payer: Self-pay | Admitting: Internal Medicine

## 2018-07-10 NOTE — Telephone Encounter (Signed)
Kimberly [name] -please inform patient that her tumor marker is at 34.9/overall fairly stable.  Continue follow-up as planned no new recommendations.

## 2018-07-11 ENCOUNTER — Other Ambulatory Visit: Payer: Self-pay

## 2018-07-11 DIAGNOSIS — C50511 Malignant neoplasm of lower-outer quadrant of right female breast: Secondary | ICD-10-CM

## 2018-07-11 DIAGNOSIS — Z170 Estrogen receptor positive status [ER+]: Principal | ICD-10-CM

## 2018-07-11 NOTE — Telephone Encounter (Signed)
Patient notified of these lab results and verbalized understanding.   

## 2018-07-14 ENCOUNTER — Other Ambulatory Visit: Payer: Self-pay | Admitting: Internal Medicine

## 2018-07-14 NOTE — Telephone Encounter (Signed)
Abnormal TSH   Ref Range & Units 70mo ago  TSH 0.350 - 4.500 uIU/mL 3.990   Comment: Performed by a 3rd Generation assay with a functional sensitivity of <=0.01 uIU/mL.  Performed at [facility], [address] [place], [place] [address]   Resulting Agency  [facility]      Specimen Collected: 01/31/18 08:24 Last Resulted: 01/31/18 13:00

## 2018-07-18 ENCOUNTER — Inpatient Hospital Stay: Payer: BLUE CROSS/BLUE SHIELD

## 2018-07-18 ENCOUNTER — Other Ambulatory Visit: Payer: Self-pay | Admitting: *Deleted

## 2018-07-18 ENCOUNTER — Other Ambulatory Visit: Payer: Self-pay | Admitting: Internal Medicine

## 2018-07-18 DIAGNOSIS — Z5111 Encounter for antineoplastic chemotherapy: Secondary | ICD-10-CM | POA: Diagnosis not present

## 2018-07-18 DIAGNOSIS — E039 Hypothyroidism, unspecified: Secondary | ICD-10-CM

## 2018-07-18 DIAGNOSIS — Z170 Estrogen receptor positive status [ER+]: Principal | ICD-10-CM

## 2018-07-18 DIAGNOSIS — C50511 Malignant neoplasm of lower-outer quadrant of right female breast: Secondary | ICD-10-CM

## 2018-07-18 LAB — COMPREHENSIVE METABOLIC PANEL
ALBUMIN: 3.8 g/dL (ref 3.5–5.0)
ALK PHOS: 58 U/L (ref 38–126)
ALT: 29 U/L (ref 0–44)
ANION GAP: 9 (ref 5–15)
AST: 49 U/L — ABNORMAL HIGH (ref 15–41)
BUN: 20 mg/dL (ref 8–23)
CALCIUM: 10.4 mg/dL — AB (ref 8.9–10.3)
CO2: 23 mmol/L (ref 22–32)
Chloride: 102 mmol/L (ref 98–111)
Creatinine, Ser: 1.14 mg/dL — ABNORMAL HIGH (ref 0.44–1.00)
GFR calc Af Amer: 58 mL/min — ABNORMAL LOW (ref >60)
GFR calc non Af Amer: 50 mL/min — ABNORMAL LOW (ref >60)
GLUCOSE: 173 mg/dL — AB (ref 70–99)
Potassium: 4.3 mmol/L (ref 3.5–5.1)
SODIUM: 134 mmol/L — AB (ref 135–145)
Total Bilirubin: 0.5 mg/dL (ref 0.3–1.2)
Total Protein: 7.2 g/dL (ref 6.5–8.1)

## 2018-07-18 LAB — CBC WITH DIFFERENTIAL/PLATELET
Abs Immature Granulocytes: 0 10*3/uL (ref 0.00–0.07)
BASOS ABS: 0.1 10*3/uL (ref 0.0–0.1)
Basophils Relative: 2 %
EOS ABS: 0.1 10*3/uL (ref 0.0–0.5)
Eosinophils Relative: 3 %
HEMATOCRIT: 33.1 % — AB (ref 36.0–46.0)
Hemoglobin: 11.3 g/dL — ABNORMAL LOW (ref 12.0–15.0)
Immature Granulocytes: 0 %
LYMPHS ABS: 1.4 10*3/uL (ref 0.7–4.0)
LYMPHS PCT: 46 %
MCH: 33.4 pg (ref 26.0–34.0)
MCHC: 34.1 g/dL (ref 30.0–36.0)
MCV: 97.9 fL (ref 80.0–100.0)
MONOS PCT: 8 %
Monocytes Absolute: 0.3 10*3/uL (ref 0.1–1.0)
NEUTROS PCT: 41 %
Neutro Abs: 1.2 10*3/uL — ABNORMAL LOW (ref 1.7–7.7)
PLATELETS: 243 10*3/uL (ref 150–400)
RBC: 3.38 MIL/uL — ABNORMAL LOW (ref 3.87–5.11)
RDW: 13 % (ref 11.5–15.5)
WBC: 3.1 10*3/uL — ABNORMAL LOW (ref 4.0–10.5)
nRBC: 0 % (ref 0.0–0.2)

## 2018-07-18 LAB — TSH: TSH: 6.702 u[IU]/mL — AB (ref 0.350–4.500)

## 2018-07-18 MED ORDER — LEVOTHYROXINE SODIUM 25 MCG PO TABS: 25.0000 ug | ORAL_TABLET | Freq: Every day | ORAL | 1 refills | Status: DC

## 2018-07-19 LAB — CANCER ANTIGEN 27.29: CAN 27.29: 30.7 U/mL (ref 0.0–38.6)

## 2018-08-01 ENCOUNTER — Inpatient Hospital Stay (HOSPITAL_BASED_OUTPATIENT_CLINIC_OR_DEPARTMENT_OTHER): Payer: BLUE CROSS/BLUE SHIELD | Admitting: Internal Medicine

## 2018-08-01 ENCOUNTER — Inpatient Hospital Stay: Payer: BLUE CROSS/BLUE SHIELD

## 2018-08-01 ENCOUNTER — Other Ambulatory Visit: Payer: Self-pay | Admitting: Internal Medicine

## 2018-08-01 ENCOUNTER — Inpatient Hospital Stay: Payer: BLUE CROSS/BLUE SHIELD | Attending: Internal Medicine

## 2018-08-01 VITALS — BP 149/91 | HR 88 | Temp 97.7°F | Resp 16 | Wt 180.0 lb

## 2018-08-01 DIAGNOSIS — N182 Chronic kidney disease, stage 2 (mild): Secondary | ICD-10-CM | POA: Insufficient documentation

## 2018-08-01 DIAGNOSIS — M7989 Other specified soft tissue disorders: Secondary | ICD-10-CM | POA: Insufficient documentation

## 2018-08-01 DIAGNOSIS — Z7901 Long term (current) use of anticoagulants: Secondary | ICD-10-CM | POA: Diagnosis not present

## 2018-08-01 DIAGNOSIS — Z170 Estrogen receptor positive status [ER+]: Secondary | ICD-10-CM | POA: Insufficient documentation

## 2018-08-01 DIAGNOSIS — C50511 Malignant neoplasm of lower-outer quadrant of right female breast: Secondary | ICD-10-CM | POA: Insufficient documentation

## 2018-08-01 DIAGNOSIS — B356 Tinea cruris: Secondary | ICD-10-CM

## 2018-08-01 DIAGNOSIS — Z5111 Encounter for antineoplastic chemotherapy: Secondary | ICD-10-CM | POA: Diagnosis not present

## 2018-08-01 DIAGNOSIS — I82621 Acute embolism and thrombosis of deep veins of right upper extremity: Secondary | ICD-10-CM

## 2018-08-01 LAB — CBC WITH DIFFERENTIAL/PLATELET
Abs Immature Granulocytes: 0 10*3/uL (ref 0.00–0.07)
BASOS ABS: 0.1 10*3/uL (ref 0.0–0.1)
Basophils Relative: 3 %
EOS ABS: 0.1 10*3/uL (ref 0.0–0.5)
EOS PCT: 3 %
HEMATOCRIT: 33.9 % — AB (ref 36.0–46.0)
HEMOGLOBIN: 11.2 g/dL — AB (ref 12.0–15.0)
Immature Granulocytes: 0 %
LYMPHS ABS: 1.5 10*3/uL (ref 0.7–4.0)
LYMPHS PCT: 45 %
MCH: 32.5 pg (ref 26.0–34.0)
MCHC: 33 g/dL (ref 30.0–36.0)
MCV: 98.3 fL (ref 80.0–100.0)
MONO ABS: 0.2 10*3/uL (ref 0.1–1.0)
MONOS PCT: 7 %
NRBC: 0 % (ref 0.0–0.2)
Neutro Abs: 1.4 10*3/uL — ABNORMAL LOW (ref 1.7–7.7)
Neutrophils Relative %: 42 %
Platelets: 261 10*3/uL (ref 150–400)
RBC: 3.45 MIL/uL — ABNORMAL LOW (ref 3.87–5.11)
RDW: 13.1 % (ref 11.5–15.5)
WBC: 3.3 10*3/uL — ABNORMAL LOW (ref 4.0–10.5)

## 2018-08-01 LAB — COMPREHENSIVE METABOLIC PANEL
ALBUMIN: 3.8 g/dL (ref 3.5–5.0)
ALK PHOS: 71 U/L (ref 38–126)
ALT: 27 U/L (ref 0–44)
AST: 45 U/L — AB (ref 15–41)
Anion gap: 10 (ref 5–15)
BILIRUBIN TOTAL: 0.3 mg/dL (ref 0.3–1.2)
BUN: 15 mg/dL (ref 8–23)
CO2: 23 mmol/L (ref 22–32)
Calcium: 10.1 mg/dL (ref 8.9–10.3)
Chloride: 103 mmol/L (ref 98–111)
Creatinine, Ser: 1.11 mg/dL — ABNORMAL HIGH (ref 0.44–1.00)
GFR calc Af Amer: >60 mL/min (ref >60)
GFR calc non Af Amer: 53 mL/min — ABNORMAL LOW (ref >60)
GLUCOSE: 211 mg/dL — AB (ref 70–99)
POTASSIUM: 4.7 mmol/L (ref 3.5–5.1)
Sodium: 136 mmol/L (ref 135–145)
Total Protein: 7.2 g/dL (ref 6.5–8.1)

## 2018-08-01 MED ORDER — FULVESTRANT 250 MG/5ML IM SOLN
500.0000 mg | Freq: Once | INTRAMUSCULAR | Status: AC
  Administered 2018-08-01: 500 mg via INTRAMUSCULAR

## 2018-08-01 NOTE — Patient Instructions (Signed)

## 2018-08-01 NOTE — Assessment & Plan Note (Addendum)
#  Recurrent breast cancer-ER positive PR negative ? HER-2/neu-currently on Faslodex plus abema. Oct 30th 2019- CT A/P-NEG-STABLE.   #Continue Faslodex plus Abema 100 mg BID; tolerating well no evidence of progression. Labs today reviewed;  acceptable for treatment today-except ANC 1.4.  Check CBC in 2 weeks. Will order imaging at next visit.   # RIGHT UE DVT- on eliquis-Stable;  Wants to hold off Physical therapy.   # CKD-II; creatinine 1.1.  Stable.   #Tinea cruris-anti-fungalpowder/keeping the area dry;s/p terbonafine- improved; dermatology referral.   DISPOSITION:  # faslodex today #  follow up in 4 weeks/cbc/cmp/ca-27-29; faslodex- Dr.B

## 2018-08-01 NOTE — Progress Notes (Signed)
.[place] OFFICE PROGRESS NOTE  Patient Care Team: Karen Kitchens, MD as PCP - General (Family Medicine) Neldon Mc, MD (General Surgery) Everlene Farrier, MD (Obstetrics and Gynecology) Noreene Filbert, MD Forest Gleason, MD (Inactive) (Unknown Physician Specialty)  Cancer Staging Carcinoma of lower-outer quadrant of right breast in female, estrogen receptor positive [facility]) Staging form: Breast, AJCC 7th Edition - Pathologic: ypT1c,ypN2a, MX - Signed by Haywood Lasso, MD on 03/10/2012    Oncology History   # DEC 2012- RIGHT BREAST CA T2 N2 M0 tumor from biopsy.  Estrogen receptor positive, Progesterone receptor positive.  Current receptor negative by FISH 2. Neoadjuvant chemotherapy started in December of 28 with Cytoxan Adriamycin 3. Started on Taxol weekly chemotherapy. 4. Patient finished 12  cycles of Taxol chemotherapy in May of 2013.     5. Status post right modified radical mastectomy [Dr.Bowers; [place]] June of 2013, ypT1c  yp N2  MO. started also on Lerazole    7. Radiation therapy to the right breast (September of 2013).  Lymph node was positive for HER-2 receptor gene amplification of 2.22.  Will proceed with Herceptin treatment starting in September of 2013.   8.Patient has finished Herceptin (maintenance therapy) in August of 2014 8. Start patient on letrozole from November, 2013. 9. Patient started on Herceptin in September 2013.   10Patient finished 12 months of Herceptin therapy on August, 2014  # 6. Status post left side prophylactic mastectomy.  #Late MAY 2018-RECURRENCE BREAST CA- ER positive/PR negative; ?? HER-2/neu- [biopsy- proven-mediastinal lymph node; in suff for her 2 testing].  [elevated Tumor marker- CT/PET- uptake in Right Mediastinal LN; Sternum [June 2018 EBUS- Dr.Kasa]   # March 10 2017- faslodex + Abema; OCT 5th CT-PR of mediastinal LN --------------------------------------------    DIAGNOSIS: [ BREAST CANCER-  ER/PR/HER2 NEU POS  STAGE:  4   ;GOALS: PALLIATIVE  CURRENT/MOST RECENT THERAPY - ABEMA + FASLODEX      Carcinoma of lower-outer quadrant of right breast in female, estrogen receptor positive ([place])   INTERVAL HISTORY:  Kimberly [name] 62 y.o.  female pleasant patient above history of metastatic ER PR positive HER-2/neu positive breast cancer currently on abema+ Faslodex is here for follow-up.  Patient is currently continues to have mild rash in her perineal region.  Overall improved.  Continues to have mild swelling of the right upper extremity.  She continues with Eliquis.  No fever no chills.  No nausea no vomiting mild diarrhea.  Review of Systems  Constitutional: Positive for malaise/fatigue. Negative for chills, diaphoresis, fever and weight loss.  HENT: Negative for nosebleeds and sore throat.   Eyes: Negative for double vision.  Respiratory: Negative for cough, hemoptysis, sputum production, shortness of breath and wheezing.   Cardiovascular: Negative for chest pain, palpitations, orthopnea and leg swelling.  Gastrointestinal: Positive for diarrhea. Negative for abdominal pain, blood in stool, constipation, heartburn, melena, nausea and vomiting.  Genitourinary: Negative for dysuria, frequency and urgency.  Musculoskeletal: Positive for back pain and joint pain.  Skin: Positive for itching and rash.  Neurological: Negative for dizziness, tingling, focal weakness, weakness and headaches.  Endo/Heme/Allergies: Does not bruise/bleed easily.  Psychiatric/Behavioral: Negative for depression. The patient is not nervous/anxious and does not have insomnia.       PAST MEDICAL HISTORY :  Past Medical History:  Diagnosis Date  . Arthritis   . Cancer of lower-outer quadrant of female breast ([place]) 08/06/2011   RIGHT   . Diabetes mellitus ([place]) 03/24/2012  . High  cholesterol   . History of kidney stones   . Hx of bilateral breast implants   . Hypertension   . PONV  (postoperative nausea and vomiting)   . Type II diabetes mellitus (H[facility])    fasting 140-150  . Vertigo    LAST WEEK  . Vertigo 01/2017    PAST SURGICAL HISTORY :   Past Surgical History:  Procedure Laterality Date  . BREAST BIOPSY  07/2011   right  . BREAST RECONSTRUCTION  03/07/2012   Procedure: BREAST RECONSTRUCTION;  Surgeon: Crissie Reese, MD;  Location: [place];  Service: Plastics;  Laterality: Left;  BREAST RECONSTRUCTION WITH PLACEMENT OF TISSUE EXPANDER TO LEFT BREAST  . BREAST RECONSTRUCTION  02/06/2013  . CESAREAN SECTION  [id number]  . ENDOBRONCHIAL ULTRASOUND N/A 02/24/2017   Procedure: ENDOBRONCHIAL ULTRASOUND;  Surgeon: [name], MD;  Location: [facility];  Service: Cardiopulmonary;  Laterality: N/A;  . LATISSIMUS FLAP TO BREAST Right 02/06/2013   Procedure: LATISSIMUS FLAP TO RIGHT BREAST WITH IMPLANT;  Surgeon: Crissie Reese, MD;  Location: [place];  Service: Plastics;  Laterality: Right;  . MASTECTOMY  03/07/12   modified right; total left  . MODIFIED MASTECTOMY  03/07/2012   Procedure: MODIFIED MASTECTOMY;  Surgeon: Haywood Lasso, MD;  Location: [place];  Service: General;  Laterality: Right;  . PORTACATH PLACEMENT  07/2011  . SCAR REVISION  03/30/2012   Procedure: SCAR REVISION;  Surgeon: Haywood Lasso, MD;  Location: [place];  Service: General;  Laterality: Right;  CLOSURE OF MASTECTOMY INCISION  . WISDOM TOOTH EXTRACTION      FAMILY HISTORY :   Family History  Problem Relation Age of Onset  . Cancer Mother        breast    SOCIAL HISTORY:   Social History   Tobacco Use  . Smoking status: Never Smoker  . Smokeless tobacco: Never Used  Substance Use Topics  . Alcohol use: No  . Drug use: No    ALLERGIES:  is allergic to sulfa antibiotics.  MEDICATIONS:  Current Outpatient Medications  Medication Sig Dispense Refill  . apixaban (ELIQUIS) 5 MG TABS tablet Take 1 tablet (5 mg total) by mouth 2 (two) times daily. 60 tablet 3  . aspirin  (ASPIRIN CHILDRENS) 81 MG chewable tablet Chew 1 tablet (81 mg total) by mouth daily. 120 tablet 0  . atorvastatin (LIPITOR) 20 MG tablet Take 20 mg by mouth every morning.    . Calcium Carbonate-Vitamin D (CALCIUM 600 + D PO) Take 1 tablet by mouth 2 (two) times daily.    . fulvestrant (FASLODEX) 250 MG/5ML injection Inject into the muscle every 30 (thirty) days. One injection each buttock over 1-2 minutes. Warm prior to use.    Marland Kitchen glipiZIDE (GLUCOTROL XL) 10 MG 24 hr tablet Take 10 mg by mouth 2 (two) times daily.  11  . levothyroxine (SYNTHROID, LEVOTHROID) 25 MCG tablet Take 1 tablet (25 mcg total) by mouth daily before breakfast. 90 tablet 1  . lisinopril (PRINIVIL,ZESTRIL) 10 MG tablet Take 10 mg by mouth every morning.     . loperamide (IMODIUM A-D) 2 MG tablet Take 2 mg by mouth 4 (four) times daily as needed for diarrhea or loose stools.    . ondansetron (ZOFRAN) 8 MG tablet Take 1 tablet (8 mg total) every 8 (eight) hours as needed by mouth for nausea or vomiting. 20 tablet 3  . pantoprazole (PROTONIX) 40 MG tablet Take 1 tablet (40 mg total) by mouth daily.  30 mins prior to meals; On empty stomach 30 tablet 0  . prochlorperazine (COMPAZINE) 10 MG tablet Take 1 tablet (10 mg total) by mouth every 6 (six) hours as needed for nausea or vomiting. 30 tablet 0  . Semaglutide (OZEMPIC) 0.25 or 0.5 MG/DOSE SOPN Inject 0.25 mg into the skin once a week.    . terbinafine (LAMISIL) 250 MG tablet Take 1 tablet (250 mg total) by mouth daily. 14 tablet 0  . VERZENIO 100 MG tablet TAKE 1 TABLET (100 MG TOTAL) BY MOUTH 2 (TWO) TIMES DAILY. 56 tablet 4   No current facility-administered medications for this visit.     PHYSICAL EXAMINATION: ECOG PERFORMANCE STATUS: 1 - Symptomatic but completely ambulatory  BP (!) 149/91 (BP Location: Left Arm, Patient Position: Sitting)   Pulse 88   Temp 97.7 F (36.5 C)   Resp 16   Wt 180 lb (81.6 kg)   BMI 32.92 kg/m   Filed Weights   08/01/18 0851   Weight: 180 lb (81.6 kg)    Physical Exam  Constitutional: She is oriented to person, place, and time and well-developed, well-nourished, and in no distress.  Accompanied by husband.  HENT:  Head: Normocephalic and atraumatic.  Mouth/Throat: Oropharynx is clear and moist. No oropharyngeal exudate.  Eyes: Pupils are equal, round, and reactive to light.  Neck: Normal range of motion. Neck supple.  Cardiovascular: Normal rate and regular rhythm.  Pulmonary/Chest: No respiratory distress. She has no wheezes.  Abdominal: Soft. Bowel sounds are normal. She exhibits no distension and no mass. There is no tenderness. There is no rebound and no guarding.  Musculoskeletal: Normal range of motion. She exhibits no edema or tenderness.  Right arm swollen compared to left.  No tenderness no erythema.  Neurological: She is alert and oriented to person, place, and time.  Skin: Skin is warm.  Psychiatric: Affect normal.       LABORATORY DATA:  I have reviewed the data as listed    Component Value Date/Time   NA 136 08/01/2018 0828   NA 135 11/28/2014 1401   K 4.7 08/01/2018 0828   K 4.7 11/28/2014 1401   CL 103 08/01/2018 0828   CL 99 (L) 11/28/2014 1401   CO2 23 08/01/2018 0828   CO2 28 11/28/2014 1401   GLUCOSE 211 (H) 08/01/2018 0828   GLUCOSE 215 (H) 11/28/2014 1401   BUN 15 08/01/2018 0828   BUN 16 11/28/2014 1401   CREATININE 1.11 (H) 08/01/2018 0828   CREATININE 0.96 11/28/2014 1401   CALCIUM 10.1 08/01/2018 0828   CALCIUM 9.9 11/28/2014 1401   PROT 7.2 08/01/2018 0828   PROT 7.6 11/28/2014 1401   ALBUMIN 3.8 08/01/2018 0828   ALBUMIN 4.5 11/28/2014 1401   AST 45 (H) 08/01/2018 0828   AST 26 11/28/2014 1401   ALT 27 08/01/2018 0828   ALT 28 11/28/2014 1401   ALKPHOS 71 08/01/2018 0828   ALKPHOS 78 11/28/2014 1401   BILITOT 0.3 08/01/2018 0828   BILITOT 0.5 11/28/2014 1401   GFRNONAA 53 (L) 08/01/2018 0828   GFRNONAA >60 11/28/2014 1401   GFRAA >60 08/01/2018 0828    GFRAA >60 11/28/2014 1401    No results found for: SPEP, UPEP  Lab Results  Component Value Date   WBC 3.3 (L) 08/01/2018   NEUTROABS 1.4 (L) 08/01/2018   HGB 11.2 (L) 08/01/2018   HCT 33.9 (L) 08/01/2018   MCV 98.3 08/01/2018   PLT 261 08/01/2018      Chemistry  Component Value Date/Time   NA 136 08/01/2018 0828   NA 135 11/28/2014 1401   K 4.7 08/01/2018 0828   K 4.7 11/28/2014 1401   CL 103 08/01/2018 0828   CL 99 (L) 11/28/2014 1401   CO2 23 08/01/2018 0828   CO2 28 11/28/2014 1401   BUN 15 08/01/2018 0828   BUN 16 11/28/2014 1401   CREATININE 1.11 (H) 08/01/2018 0828   CREATININE 0.96 11/28/2014 1401      Component Value Date/Time   CALCIUM 10.1 08/01/2018 0828   CALCIUM 9.9 11/28/2014 1401   ALKPHOS 71 08/01/2018 0828   ALKPHOS 78 11/28/2014 1401   AST 45 (H) 08/01/2018 0828   AST 26 11/28/2014 1401   ALT 27 08/01/2018 0828   ALT 28 11/28/2014 1401   BILITOT 0.3 08/01/2018 0828   BILITOT 0.5 11/28/2014 1401       RADIOGRAPHIC STUDIES: I have personally reviewed the radiological images as listed and agreed with the findings in the report. No results found.   ASSESSMENT & PLAN:  Carcinoma of lower-outer quadrant of right breast in female, estrogen receptor positive ([place])  #Recurrent breast cancer-ER positive PR negative ? HER-2/neu-currently on Faslodex plus abema. Oct 30th 2019- CT A/P-NEG-STABLE.   #Continue Faslodex plus Abema 100 mg BID; tolerating well no evidence of progression. Labs today reviewed;  acceptable for treatment today-except ANC 1.4.  Check CBC in 2 weeks. Will order imaging at next visit.   # RIGHT UE DVT- on eliquis-Stable;  Wants to hold off Physical therapy.   # CKD-II; creatinine 1.1.  Stable.   #Tinea cruris-anti-fungalpowder/keeping the area dry;s/p terbonafine- improved; dermatology referral.   DISPOSITION:  # faslodex today #  follow up in 4 weeks/cbc/cmp/ca-27-29; faslodex- Dr.B   Orders Placed This Encounter   Procedures  . CBC with Differential/Platelet    Standing Status:   Future    Standing Expiration Date:   08/02/2019  . Comprehensive metabolic panel    Standing Status:   Future    Standing Expiration Date:   08/02/2019  . Cancer antigen 27.29    Standing Status:   Future    Standing Expiration Date:   08/02/2019   All questions were answered. The patient knows to call the clinic with any problems, questions or concerns.      [name], MD 08/01/2018 1:15 PM

## 2018-08-02 LAB — CANCER ANTIGEN 27.29: CAN 27.29: 29.4 U/mL (ref 0.0–38.6)

## 2018-08-03 ENCOUNTER — Telehealth: Payer: Self-pay | Admitting: Internal Medicine

## 2018-08-03 MED FILL — VERZENIO 100 MG TAB: 100 | 28 days supply | Qty: 56 | Fill #3

## 2018-08-03 NOTE — Telephone Encounter (Signed)
Patient notified of these lab results and verbalized understanding.   

## 2018-08-03 NOTE — Telephone Encounter (Signed)
Heather/[name] please inform patient that tumor markers improving 29.  Continue follow-up no new recommendations.

## 2018-08-29 ENCOUNTER — Inpatient Hospital Stay: Payer: BLUE CROSS/BLUE SHIELD

## 2018-08-29 ENCOUNTER — Other Ambulatory Visit: Payer: Self-pay

## 2018-08-29 ENCOUNTER — Encounter: Payer: Self-pay | Admitting: Internal Medicine

## 2018-08-29 ENCOUNTER — Inpatient Hospital Stay (HOSPITAL_BASED_OUTPATIENT_CLINIC_OR_DEPARTMENT_OTHER): Payer: BLUE CROSS/BLUE SHIELD | Admitting: Internal Medicine

## 2018-08-29 VITALS — BP 143/84 | HR 94 | Temp 97.0°F | Resp 20 | Ht 62.0 in | Wt 177.9 lb

## 2018-08-29 DIAGNOSIS — Z170 Estrogen receptor positive status [ER+]: Secondary | ICD-10-CM | POA: Diagnosis not present

## 2018-08-29 DIAGNOSIS — I82621 Acute embolism and thrombosis of deep veins of right upper extremity: Secondary | ICD-10-CM | POA: Diagnosis not present

## 2018-08-29 DIAGNOSIS — N183 Chronic kidney disease, stage 3 (moderate): Secondary | ICD-10-CM

## 2018-08-29 DIAGNOSIS — B356 Tinea cruris: Secondary | ICD-10-CM

## 2018-08-29 DIAGNOSIS — C50511 Malignant neoplasm of lower-outer quadrant of right female breast: Secondary | ICD-10-CM

## 2018-08-29 DIAGNOSIS — T451X5A Adverse effect of antineoplastic and immunosuppressive drugs, initial encounter: Secondary | ICD-10-CM

## 2018-08-29 DIAGNOSIS — R110 Nausea: Secondary | ICD-10-CM

## 2018-08-29 DIAGNOSIS — R197 Diarrhea, unspecified: Secondary | ICD-10-CM

## 2018-08-29 DIAGNOSIS — R21 Rash and other nonspecific skin eruption: Secondary | ICD-10-CM

## 2018-08-29 DIAGNOSIS — R5383 Other fatigue: Secondary | ICD-10-CM

## 2018-08-29 DIAGNOSIS — Z5111 Encounter for antineoplastic chemotherapy: Secondary | ICD-10-CM | POA: Diagnosis not present

## 2018-08-29 LAB — COMPREHENSIVE METABOLIC PANEL
ALBUMIN: 3.6 g/dL (ref 3.5–5.0)
ALK PHOS: 60 U/L (ref 38–126)
ALT: 20 U/L (ref 0–44)
AST: 41 U/L (ref 15–41)
Anion gap: 13 (ref 5–15)
BILIRUBIN TOTAL: 0.4 mg/dL (ref 0.3–1.2)
BUN: 21 mg/dL (ref 8–23)
CO2: 23 mmol/L (ref 22–32)
Calcium: 9.6 mg/dL (ref 8.9–10.3)
Chloride: 97 mmol/L — ABNORMAL LOW (ref 98–111)
Creatinine, Ser: 1.21 mg/dL — ABNORMAL HIGH (ref 0.44–1.00)
GFR calc Af Amer: 55 mL/min — ABNORMAL LOW (ref >60)
GFR calc non Af Amer: 48 mL/min — ABNORMAL LOW (ref >60)
GLUCOSE: 213 mg/dL — AB (ref 70–99)
POTASSIUM: 4.3 mmol/L (ref 3.5–5.1)
Sodium: 133 mmol/L — ABNORMAL LOW (ref 135–145)
TOTAL PROTEIN: 7 g/dL (ref 6.5–8.1)

## 2018-08-29 LAB — CBC WITH DIFFERENTIAL/PLATELET
ABS IMMATURE GRANULOCYTES: 0.02 10*3/uL (ref 0.00–0.07)
BASOS PCT: 3 %
Basophils Absolute: 0.1 10*3/uL (ref 0.0–0.1)
Eosinophils Absolute: 0.1 10*3/uL (ref 0.0–0.5)
Eosinophils Relative: 1 %
HCT: 33.3 % — ABNORMAL LOW (ref 36.0–46.0)
HEMOGLOBIN: 11.3 g/dL — AB (ref 12.0–15.0)
Immature Granulocytes: 1 %
Lymphocytes Relative: 46 %
Lymphs Abs: 1.7 10*3/uL (ref 0.7–4.0)
MCH: 32.5 pg (ref 26.0–34.0)
MCHC: 33.9 g/dL (ref 30.0–36.0)
MCV: 95.7 fL (ref 80.0–100.0)
MONO ABS: 0.3 10*3/uL (ref 0.1–1.0)
MONOS PCT: 9 %
NEUTROS ABS: 1.4 10*3/uL — AB (ref 1.7–7.7)
Neutrophils Relative %: 40 %
Platelets: 304 10*3/uL (ref 150–400)
RBC: 3.48 MIL/uL — ABNORMAL LOW (ref 3.87–5.11)
RDW: 13.2 % (ref 11.5–15.5)
WBC: 3.6 10*3/uL — ABNORMAL LOW (ref 4.0–10.5)
nRBC: 0 % (ref 0.0–0.2)

## 2018-08-29 MED ORDER — ONDANSETRON HCL 8 MG PO TABS: 8.0000 mg | ORAL_TABLET | Freq: Three times a day (TID) | ORAL | 3 refills | Status: DC | PRN

## 2018-08-29 MED ORDER — FULVESTRANT 250 MG/5ML IM SOLN
500.0000 mg | Freq: Once | INTRAMUSCULAR | Status: AC
  Administered 2018-08-29: 500 mg via INTRAMUSCULAR

## 2018-08-29 NOTE — Progress Notes (Signed)
.[place] OFFICE PROGRESS NOTE  Patient Care Team: Karen Kitchens, MD as PCP - General (Family Medicine) Neldon Mc, MD (General Surgery) Everlene Farrier, MD (Obstetrics and Gynecology) Noreene Filbert, MD Forest Gleason, MD (Inactive) (Unknown Physician Specialty)  Cancer Staging Carcinoma of lower-outer quadrant of right breast in female, estrogen receptor positive [facility]) Staging form: Breast, AJCC 7th Edition - Pathologic: ypT1c,ypN2a, MX - Signed by Haywood Lasso, MD on 03/10/2012    Oncology History   # DEC 2012- RIGHT BREAST CA T2 N2 M0 tumor from biopsy.  Estrogen receptor positive, Progesterone receptor positive.  Current receptor negative by FISH 2. Neoadjuvant chemotherapy started in December of 28 with Cytoxan Adriamycin 3. Started on Taxol weekly chemotherapy. 4. Patient finished 12  cycles of Taxol chemotherapy in May of 2013.     5. Status post right modified radical mastectomy [Dr.Bowers; [place]] June of 2013, ypT1c  yp N2  MO. started also on Lerazole    7. Radiation therapy to the right breast (September of 2013).  Lymph node was positive for HER-2 receptor gene amplification of 2.22.  Will proceed with Herceptin treatment starting in September of 2013.   8.Patient has finished Herceptin (maintenance therapy) in August of 2014 8. Start patient on letrozole from November, 2013. 9. Patient started on Herceptin in September 2013.   10Patient finished 12 months of Herceptin therapy on August, 2014  # 6. Status post left side prophylactic mastectomy.  #Late MAY 2018-RECURRENCE BREAST CA- ER positive/PR negative; ?? HER-2/neu- [biopsy- proven-mediastinal lymph node; in suff for her 2 testing].  [elevated Tumor marker- CT/PET- uptake in Right Mediastinal LN; Sternum [June 2018 EBUS- Dr.Kasa]   # March 10 2017- faslodex + Abema; OCT 5th CT-PR of mediastinal LN --------------------------------------------    DIAGNOSIS: [ BREAST CANCER-  ER/PR/HER2 NEU POS  STAGE:  4   ;GOALS: PALLIATIVE  CURRENT/MOST RECENT THERAPY - ABEMA + FASLODEX      Carcinoma of lower-outer quadrant of right breast in female, estrogen receptor positive ([place])   INTERVAL HISTORY:  Kimberly [name] 62 y.o.  female pleasant patient above history of metastatic ER PR positive HER-2/neu positive breast cancer currently on abema+ Faslodex is here for follow-up.  Patient states her rash is improved.  Denies any shortness of breath or cough.  Continues to have mild swelling of the right upper extremity/on Eliquis.  Overall improved.  No headaches no fever no chills.  Mild fatigue.  Mild diarrhea. Review of Systems  Constitutional: Positive for malaise/fatigue. Negative for chills, diaphoresis, fever and weight loss.  HENT: Negative for nosebleeds and sore throat.   Eyes: Negative for double vision.  Respiratory: Negative for cough, hemoptysis, sputum production, shortness of breath and wheezing.   Cardiovascular: Negative for chest pain, palpitations, orthopnea and leg swelling.  Gastrointestinal: Positive for diarrhea. Negative for abdominal pain, blood in stool, constipation, heartburn, melena, nausea and vomiting.  Genitourinary: Negative for dysuria, frequency and urgency.  Musculoskeletal: Positive for back pain and joint pain.  Skin: Positive for itching and rash.  Neurological: Negative for dizziness, tingling, focal weakness, weakness and headaches.  Endo/Heme/Allergies: Does not bruise/bleed easily.  Psychiatric/Behavioral: Negative for depression. The patient is not nervous/anxious and does not have insomnia.       PAST MEDICAL HISTORY :  Past Medical History:  Diagnosis Date  . Arthritis   . Cancer of lower-outer quadrant of female breast ([place]) 08/06/2011   RIGHT   . Diabetes mellitus ([place]) 03/24/2012  . High cholesterol   .  History of kidney stones   . Hx of bilateral breast implants   . Hypertension   . PONV (postoperative nausea  and vomiting)   . Type II diabetes mellitus ([facility])    fasting 140-150  . Vertigo    LAST WEEK  . Vertigo 01/2017    PAST SURGICAL HISTORY :   Past Surgical History:  Procedure Laterality Date  . BREAST BIOPSY  07/2011   right  . BREAST RECONSTRUCTION  03/07/2012   Procedure: BREAST RECONSTRUCTION;  Surgeon: Crissie Reese, MD;  Location: [place];  Service: Plastics;  Laterality: Left;  BREAST RECONSTRUCTION WITH PLACEMENT OF TISSUE EXPANDER TO LEFT BREAST  . BREAST RECONSTRUCTION  02/06/2013  . CESAREAN SECTION  [id number]  . ENDOBRONCHIAL ULTRASOUND N/A 02/24/2017   Procedure: ENDOBRONCHIAL ULTRASOUND;  Surgeon: [name], MD;  Location: [facility];  Service: Cardiopulmonary;  Laterality: N/A;  . LATISSIMUS FLAP TO BREAST Right 02/06/2013   Procedure: LATISSIMUS FLAP TO RIGHT BREAST WITH IMPLANT;  Surgeon: Crissie Reese, MD;  Location: [place];  Service: Plastics;  Laterality: Right;  . MASTECTOMY  03/07/12   modified right; total left  . MODIFIED MASTECTOMY  03/07/2012   Procedure: MODIFIED MASTECTOMY;  Surgeon: Haywood Lasso, MD;  Location: [place];  Service: General;  Laterality: Right;  . PORTACATH PLACEMENT  07/2011  . SCAR REVISION  03/30/2012   Procedure: SCAR REVISION;  Surgeon: Haywood Lasso, MD;  Location: [place];  Service: General;  Laterality: Right;  CLOSURE OF MASTECTOMY INCISION  . WISDOM TOOTH EXTRACTION      FAMILY HISTORY :   Family History  Problem Relation Age of Onset  . Cancer Mother        breast    SOCIAL HISTORY:   Social History   Tobacco Use  . Smoking status: Never Smoker  . Smokeless tobacco: Never Used  Substance Use Topics  . Alcohol use: No  . Drug use: No    ALLERGIES:  is allergic to sulfa antibiotics.  MEDICATIONS:  Current Outpatient Medications  Medication Sig Dispense Refill  . apixaban (ELIQUIS) 5 MG TABS tablet Take 1 tablet (5 mg total) by mouth 2 (two) times daily. 60 tablet 3  . aspirin (ASPIRIN CHILDRENS) 81  MG chewable tablet Chew 1 tablet (81 mg total) by mouth daily. 120 tablet 0  . atorvastatin (LIPITOR) 20 MG tablet Take 20 mg by mouth every morning.    . Calcium Carbonate-Vitamin D (CALCIUM 600 + D PO) Take 1 tablet by mouth 2 (two) times daily.    . fulvestrant (FASLODEX) 250 MG/5ML injection Inject into the muscle every 30 (thirty) days. One injection each buttock over 1-2 minutes. Warm prior to use.    Marland Kitchen glipiZIDE (GLUCOTROL XL) 10 MG 24 hr tablet Take 10 mg by mouth 2 (two) times daily.  11  . levothyroxine (SYNTHROID, LEVOTHROID) 25 MCG tablet Take 1 tablet (25 mcg total) by mouth daily before breakfast. 90 tablet 1  . lisinopril (PRINIVIL,ZESTRIL) 10 MG tablet Take 10 mg by mouth every morning.     . ondansetron (ZOFRAN) 8 MG tablet Take 1 tablet (8 mg total) by mouth every 8 (eight) hours as needed for nausea or vomiting. 20 tablet 3  . pantoprazole (PROTONIX) 40 MG tablet Take 1 tablet (40 mg total) by mouth daily. 30 mins prior to meals; On empty stomach 30 tablet 0  . prochlorperazine (COMPAZINE) 10 MG tablet Take 1 tablet (10 mg total) by mouth every 6 (six) hours  as needed for nausea or vomiting. 30 tablet 0  . Semaglutide (OZEMPIC) 0.25 or 0.5 MG/DOSE SOPN Inject 0.25 mg into the skin once a week.    . terbinafine (LAMISIL) 250 MG tablet Take 1 tablet (250 mg total) by mouth daily. 14 tablet 0  . VERZENIO 100 MG tablet TAKE 1 TABLET (100 MG TOTAL) BY MOUTH 2 (TWO) TIMES DAILY. 56 tablet 4  . loperamide (IMODIUM A-D) 2 MG tablet Take 2 mg by mouth 4 (four) times daily as needed for diarrhea or loose stools.     No current facility-administered medications for this visit.    Facility-Administered Medications Ordered in Other Visits  Medication Dose Route Frequency Provider Last Rate Last Dose  . fulvestrant (FASLODEX) injection 500 mg  500 mg Intramuscular Once [name], MD        PHYSICAL EXAMINATION: ECOG PERFORMANCE STATUS: 1 - Symptomatic but completely  ambulatory  BP (!) 143/84 (Patient Position: Sitting)   Pulse 94   Temp (!) 97 F (36.1 C) (Tympanic)   Resp 20   Ht 5' 2" (1.575 m)   Wt 177 lb 14.6 oz (80.7 kg)   BMI 32.54 kg/m   Filed Weights   08/29/18 0823  Weight: 177 lb 14.6 oz (80.7 kg)    Physical Exam  Constitutional: She is oriented to person, place, and time and well-developed, well-nourished, and in no distress.  Accompanied by husband.  HENT:  Head: Normocephalic and atraumatic.  Mouth/Throat: Oropharynx is clear and moist. No oropharyngeal exudate.  Eyes: Pupils are equal, round, and reactive to light.  Neck: Normal range of motion. Neck supple.  Cardiovascular: Normal rate and regular rhythm.  Pulmonary/Chest: No respiratory distress. She has no wheezes.  Abdominal: Soft. Bowel sounds are normal. She exhibits no distension and no mass. There is no abdominal tenderness. There is no rebound and no guarding.  Musculoskeletal: Normal range of motion.        General: No tenderness or edema.     Comments: Right arm swollen compared to left.  No tenderness no erythema.  Neurological: She is alert and oriented to person, place, and time.  Skin: Skin is warm.  Psychiatric: Affect normal.       LABORATORY DATA:  I have reviewed the data as listed    Component Value Date/Time   NA 133 (L) 08/29/2018 0810   NA 135 11/28/2014 1401   K 4.3 08/29/2018 0810   K 4.7 11/28/2014 1401   CL 97 (L) 08/29/2018 0810   CL 99 (L) 11/28/2014 1401   CO2 23 08/29/2018 0810   CO2 28 11/28/2014 1401   GLUCOSE 213 (H) 08/29/2018 0810   GLUCOSE 215 (H) 11/28/2014 1401   BUN 21 08/29/2018 0810   BUN 16 11/28/2014 1401   CREATININE 1.21 (H) 08/29/2018 0810   CREATININE 0.96 11/28/2014 1401   CALCIUM 9.6 08/29/2018 0810   CALCIUM 9.9 11/28/2014 1401   PROT 7.0 08/29/2018 0810   PROT 7.6 11/28/2014 1401   ALBUMIN 3.6 08/29/2018 0810   ALBUMIN 4.5 11/28/2014 1401   AST 41 08/29/2018 0810   AST 26 11/28/2014 1401   ALT 20  08/29/2018 0810   ALT 28 11/28/2014 1401   ALKPHOS 60 08/29/2018 0810   ALKPHOS 78 11/28/2014 1401   BILITOT 0.4 08/29/2018 0810   BILITOT 0.5 11/28/2014 1401   GFRNONAA 48 (L) 08/29/2018 0810   GFRNONAA >60 11/28/2014 1401   GFRAA 55 (L) 08/29/2018 0810   GFRAA >60 11/28/2014 1401  No results found for: SPEP, UPEP  Lab Results  Component Value Date   WBC 3.6 (L) 08/29/2018   NEUTROABS 1.4 (L) 08/29/2018   HGB 11.3 (L) 08/29/2018   HCT 33.3 (L) 08/29/2018   MCV 95.7 08/29/2018   PLT 304 08/29/2018      Chemistry      Component Value Date/Time   NA 133 (L) 08/29/2018 0810   NA 135 11/28/2014 1401   K 4.3 08/29/2018 0810   K 4.7 11/28/2014 1401   CL 97 (L) 08/29/2018 0810   CL 99 (L) 11/28/2014 1401   CO2 23 08/29/2018 0810   CO2 28 11/28/2014 1401   BUN 21 08/29/2018 0810   BUN 16 11/28/2014 1401   CREATININE 1.21 (H) 08/29/2018 0810   CREATININE 0.96 11/28/2014 1401      Component Value Date/Time   CALCIUM 9.6 08/29/2018 0810   CALCIUM 9.9 11/28/2014 1401   ALKPHOS 60 08/29/2018 0810   ALKPHOS 78 11/28/2014 1401   AST 41 08/29/2018 0810   AST 26 11/28/2014 1401   ALT 20 08/29/2018 0810   ALT 28 11/28/2014 1401   BILITOT 0.4 08/29/2018 0810   BILITOT 0.5 11/28/2014 1401       RADIOGRAPHIC STUDIES: I have personally reviewed the radiological images as listed and agreed with the findings in the report. No results found.   ASSESSMENT & PLAN:  Carcinoma of lower-outer quadrant of right breast in female, estrogen receptor positive ([place])  #Recurrent breast cancer-ER positive PR negative ? HER-2/neu-currently on Faslodex plus abema. Oct 30th 2019- CT A/P-NEG-STABLE.    #Continue Faslodex plus Abema 100 mg BID; tolerating well no evidence of progression. Labs today reviewed;  acceptable for treatment today.  Will order CT scan at next visit.  # RIGHT UE DVT- on eliquis-STABLE.    # CKD-III; creatinine 1.2-   stable  #Tinea cruris-anti-fungalpowder/keeping  the area dry;s/p terbonafine-STABLE.    DISPOSITION:  # faslodex today #  follow up in 4 weeks/cbc/cmp/ca-27-29; faslodex- Dr.B   No orders of the defined types were placed in this encounter.  All questions were answered. The patient knows to call the clinic with any problems, questions or concerns.      [name], MD 08/29/2018 8:44 AM

## 2018-08-29 NOTE — Assessment & Plan Note (Addendum)
#  Recurrent breast cancer-ER positive PR negative ? HER-2/neu-currently on Faslodex plus abema. Oct 30th 2019- CT A/P-NEG-STABLE.    #Continue Faslodex plus Abema 100 mg BID; tolerating well no evidence of progression. Labs today reviewed;  acceptable for treatment today.  Will order CT scan at next visit.  # RIGHT UE DVT- on eliquis-STABLE.    # CKD-III; creatinine 1.2-   stable  #Tinea cruris-anti-fungalpowder/keeping the area dry;s/p terbonafine-STABLE.    DISPOSITION:  # faslodex today #  follow up in 4 weeks/cbc/cmp/ca-27-29; faslodex- Dr.B

## 2018-08-30 LAB — CANCER ANTIGEN 27.29: CAN 27.29: 35.1 U/mL (ref 0.0–38.6)

## 2018-09-01 MED FILL — VERZENIO 100 MG TAB: 100 | 28 days supply | Qty: 56 | Fill #4

## 2018-09-14 ENCOUNTER — Other Ambulatory Visit: Payer: Self-pay | Admitting: Internal Medicine

## 2018-09-19 ENCOUNTER — Telehealth: Payer: Self-pay

## 2018-09-19 NOTE — Telephone Encounter (Signed)
Oral Chemotherapy Pharmacy Student Encounter   Attempted to reach patient for follow up on oral medication: Verzenio. No answer. Left VM for patient to call back.   Dahlia Bailiff [facility]/HP/AP [facility] [phone]  09/19/2018 3:46 PM

## 2018-09-21 ENCOUNTER — Other Ambulatory Visit: Payer: Self-pay | Admitting: Internal Medicine

## 2018-09-21 DIAGNOSIS — Z170 Estrogen receptor positive status [ER+]: Principal | ICD-10-CM

## 2018-09-21 DIAGNOSIS — C50511 Malignant neoplasm of lower-outer quadrant of right female breast: Secondary | ICD-10-CM

## 2018-09-22 ENCOUNTER — Other Ambulatory Visit: Payer: Self-pay

## 2018-09-22 DIAGNOSIS — C50511 Malignant neoplasm of lower-outer quadrant of right female breast: Secondary | ICD-10-CM

## 2018-09-22 DIAGNOSIS — Z170 Estrogen receptor positive status [ER+]: Principal | ICD-10-CM

## 2018-09-26 ENCOUNTER — Inpatient Hospital Stay: Payer: BLUE CROSS/BLUE SHIELD

## 2018-09-26 ENCOUNTER — Inpatient Hospital Stay: Payer: BLUE CROSS/BLUE SHIELD | Attending: Internal Medicine | Admitting: Oncology

## 2018-09-26 ENCOUNTER — Encounter: Payer: Self-pay | Admitting: Oncology

## 2018-09-26 DIAGNOSIS — Z170 Estrogen receptor positive status [ER+]: Principal | ICD-10-CM

## 2018-09-26 DIAGNOSIS — N183 Chronic kidney disease, stage 3 (moderate): Secondary | ICD-10-CM | POA: Diagnosis not present

## 2018-09-26 DIAGNOSIS — Z5111 Encounter for antineoplastic chemotherapy: Secondary | ICD-10-CM | POA: Insufficient documentation

## 2018-09-26 DIAGNOSIS — I82621 Acute embolism and thrombosis of deep veins of right upper extremity: Secondary | ICD-10-CM | POA: Diagnosis not present

## 2018-09-26 DIAGNOSIS — C50511 Malignant neoplasm of lower-outer quadrant of right female breast: Secondary | ICD-10-CM

## 2018-09-26 DIAGNOSIS — R5383 Other fatigue: Secondary | ICD-10-CM

## 2018-09-26 DIAGNOSIS — Z7901 Long term (current) use of anticoagulants: Secondary | ICD-10-CM

## 2018-09-26 DIAGNOSIS — E871 Hypo-osmolality and hyponatremia: Secondary | ICD-10-CM | POA: Diagnosis not present

## 2018-09-26 DIAGNOSIS — D701 Agranulocytosis secondary to cancer chemotherapy: Secondary | ICD-10-CM | POA: Diagnosis not present

## 2018-09-26 LAB — CBC WITH DIFFERENTIAL/PLATELET
ABS IMMATURE GRANULOCYTES: 0 10*3/uL (ref 0.00–0.07)
Basophils Absolute: 0.1 10*3/uL (ref 0.0–0.1)
Basophils Relative: 2 %
Eosinophils Absolute: 0.1 10*3/uL (ref 0.0–0.5)
Eosinophils Relative: 2 %
HCT: 35.4 % — ABNORMAL LOW (ref 36.0–46.0)
HEMOGLOBIN: 12.3 g/dL (ref 12.0–15.0)
IMMATURE GRANULOCYTES: 0 %
LYMPHS ABS: 1.5 10*3/uL (ref 0.7–4.0)
LYMPHS PCT: 45 %
MCH: 33.2 pg (ref 26.0–34.0)
MCHC: 34.7 g/dL (ref 30.0–36.0)
MCV: 95.7 fL (ref 80.0–100.0)
MONO ABS: 0.3 10*3/uL (ref 0.1–1.0)
MONOS PCT: 8 %
NEUTROS ABS: 1.4 10*3/uL — AB (ref 1.7–7.7)
Neutrophils Relative %: 43 %
Platelets: 266 10*3/uL (ref 150–400)
RBC: 3.7 MIL/uL — ABNORMAL LOW (ref 3.87–5.11)
RDW: 12.8 % (ref 11.5–15.5)
WBC: 3.3 10*3/uL — ABNORMAL LOW (ref 4.0–10.5)
nRBC: 0 % (ref 0.0–0.2)

## 2018-09-26 LAB — COMPREHENSIVE METABOLIC PANEL
ALBUMIN: 3.7 g/dL (ref 3.5–5.0)
ALT: 21 U/L (ref 0–44)
AST: 30 U/L (ref 15–41)
Alkaline Phosphatase: 61 U/L (ref 38–126)
Anion gap: 11 (ref 5–15)
BILIRUBIN TOTAL: 0.5 mg/dL (ref 0.3–1.2)
BUN: 18 mg/dL (ref 8–23)
CHLORIDE: 99 mmol/L (ref 98–111)
CO2: 21 mmol/L — ABNORMAL LOW (ref 22–32)
Calcium: 9.7 mg/dL (ref 8.9–10.3)
Creatinine, Ser: 1.1 mg/dL — ABNORMAL HIGH (ref 0.44–1.00)
GFR calc Af Amer: >60 mL/min (ref >60)
GFR calc non Af Amer: 53 mL/min — ABNORMAL LOW (ref >60)
GLUCOSE: 233 mg/dL — AB (ref 70–99)
POTASSIUM: 4.3 mmol/L (ref 3.5–5.1)
SODIUM: 131 mmol/L — AB (ref 135–145)
Total Protein: 7.3 g/dL (ref 6.5–8.1)

## 2018-09-26 MED ORDER — FULVESTRANT 250 MG/5ML IM SOLN
500.0000 mg | Freq: Once | INTRAMUSCULAR | Status: AC
  Administered 2018-09-26: 500 mg via INTRAMUSCULAR
  Filled 2018-09-26: qty 10

## 2018-09-26 MED FILL — VERZENIO 100 MG TAB: 100 | 28 days supply | Qty: 56 | Fill #0

## 2018-09-26 NOTE — Progress Notes (Signed)
.[facility] OFFICE PROGRESS NOTE  Patient Care Team: Rabinowitz, Joseph H, MD as PCP - General (Family Medicine) Streck, Christian, MD (General Surgery) Tomblin, James, MD (Obstetrics and Gynecology) Chrystal, Glenn, MD Choksi, Janak, MD (Inactive) (Unknown Physician Specialty)  Cancer Staging Carcinoma of lower-outer quadrant of right breast in female, estrogen receptor positive (HCC) Staging form: Breast, AJCC 7th Edition - Pathologic: ypT1c,ypN2a, MX - Signed by Streck, Christian J, MD on 03/10/2012    Oncology History   # DEC 2012- RIGHT BREAST CA T2 N2 M0 tumor from biopsy.  Estrogen receptor positive, Progesterone receptor positive.  Current receptor negative by FISH 2. Neoadjuvant chemotherapy started in December of 28 with Cytoxan Adriamycin 3. Started on Taxol weekly chemotherapy. 4. Patient finished 12  cycles of Taxol chemotherapy in May of 2013.     5. Status post right modified radical mastectomy [Dr.Bowers; [place]] June of 2013, ypT1c  yp N2  MO. started also on Lerazole    7. Radiation therapy to the right breast (September of 2013).  Lymph node was positive for HER-2 receptor gene amplification of 2.22.  Will proceed with Herceptin treatment starting in September of 2013.   8.Patient has finished Herceptin (maintenance therapy) in August of 2014 8. Start patient on letrozole from November, 2013. 9. Patient started on Herceptin in September 2013.   10Patient finished 12 months of Herceptin therapy on August, 2014  # 6. Status post left side prophylactic mastectomy.  #Late MAY 2018-RECURRENCE BREAST CA- ER positive/PR negative; ?? HER-2/neu- [biopsy- proven-mediastinal lymph node; in suff for her 2 testing].  [elevated Tumor marker- CT/PET- uptake in Right Mediastinal LN; Sternum [June 2018 [facility]- Dr.Kasa]   # March 10 2017- faslodex + Abema; OCT 5th CT-PR of mediastinal LN --------------------------------------------    DIAGNOSIS: [ BREAST CANCER-  ER/PR/HER2 NEU POS  STAGE:  4   ;GOALS: PALLIATIVE  CURRENT/MOST RECENT THERAPY - ABEMA + FASLODEX      Carcinoma of lower-outer quadrant of right breast in female, estrogen receptor positive (HCC)   INTERVAL HISTORY:  Kimberly [name] 62 y.o.  female pleasant patient above history of metastatic ER PR positive HER-2/neu positive breast cancer currently on abema+ Faslodex is here for follow-up.  Patient is doing well today.  She denies any shortness of breath or cough.  Swelling has essentially resolved in the right upper extremity.  She continues Eliquis.  She denies any bleeding.  Overall feels well.  Denies headaches.  No fever and no chills.  Chronic mild fatigue.  Denies diarrhea.  Review of Systems  Constitutional: Positive for malaise/fatigue. Negative for chills, fever and weight loss.  HENT: Negative for congestion, ear pain and tinnitus.   Eyes: Negative.  Negative for blurred vision and double vision.  Respiratory: Negative.  Negative for cough, sputum production and shortness of breath.   Cardiovascular: Negative.  Negative for chest pain, palpitations and leg swelling.  Gastrointestinal: Negative.  Negative for abdominal pain, constipation, diarrhea, nausea and vomiting.  Genitourinary: Negative for dysuria, frequency and urgency.  Musculoskeletal: Negative for back pain and falls.  Skin: Negative.  Negative for rash.  Neurological: Negative.  Negative for weakness and headaches.  Endo/Heme/Allergies: Negative.  Does not bruise/bleed easily.  Psychiatric/Behavioral: Negative.  Negative for depression. The patient is not nervous/anxious and does not have insomnia.       PAST MEDICAL HISTORY :  Past Medical History:  Diagnosis Date  . Arthritis   . Cancer of lower-outer quadrant of female breast (HCC) 08/06/2011     RIGHT   . Diabetes mellitus (HCC) 03/24/2012  . High cholesterol   . History of kidney stones   . Hx of bilateral breast implants   . Hypertension   .  PONV (postoperative nausea and vomiting)   . Type II diabetes mellitus (HCC)    fasting 140-150  . Vertigo    LAST WEEK  . Vertigo 01/2017    PAST SURGICAL HISTORY :   Past Surgical History:  Procedure Laterality Date  . BREAST BIOPSY  07/2011   right  . BREAST RECONSTRUCTION  03/07/2012   Procedure: BREAST RECONSTRUCTION;  Surgeon: David Bowers, MD;  Location: [facility] OR;  Service: Plastics;  Laterality: Left;  BREAST RECONSTRUCTION WITH PLACEMENT OF TISSUE EXPANDER TO LEFT BREAST  . BREAST RECONSTRUCTION  02/06/2013  . CESAREAN SECTION  1996,1997  . ENDOBRONCHIAL ULTRASOUND N/A 02/24/2017   Procedure: ENDOBRONCHIAL ULTRASOUND;  Surgeon: Kasa, Kurian, MD;  Location: [facility] ORS;  Service: Cardiopulmonary;  Laterality: N/A;  . LATISSIMUS FLAP TO BREAST Right 02/06/2013   Procedure: LATISSIMUS FLAP TO RIGHT BREAST WITH IMPLANT;  Surgeon: David Bowers, MD;  Location: [facility] OR;  Service: Plastics;  Laterality: Right;  . MASTECTOMY  03/07/12   modified right; total left  . MODIFIED MASTECTOMY  03/07/2012   Procedure: MODIFIED MASTECTOMY;  Surgeon: Christian J Streck, MD;  Location: [facility] OR;  Service: General;  Laterality: Right;  . PORTACATH PLACEMENT  07/2011  . SCAR REVISION  03/30/2012   Procedure: SCAR REVISION;  Surgeon: Christian J Streck, MD;  Location: [facility];  Service: General;  Laterality: Right;  CLOSURE OF MASTECTOMY INCISION  . WISDOM TOOTH EXTRACTION      FAMILY HISTORY :   Family History  Problem Relation Age of Onset  . Cancer Mother        breast    SOCIAL HISTORY:   Social History   Tobacco Use  . Smoking status: Never Smoker  . Smokeless tobacco: Never Used  Substance Use Topics  . Alcohol use: No  . Drug use: No    ALLERGIES:  is allergic to sulfa antibiotics.  MEDICATIONS:  Current Outpatient Medications  Medication Sig Dispense Refill  . aspirin (ASPIRIN CHILDRENS) 81 MG chewable tablet Chew 1 tablet (81 mg total) by mouth daily. 120 tablet 0  .  atorvastatin (LIPITOR) 20 MG tablet Take 20 mg by mouth every morning.    . Calcium Carbonate-Vitamin D (CALCIUM 600 + D PO) Take 1 tablet by mouth 2 (two) times daily.    . ELIQUIS 5 MG TABS tablet TAKE 1 TABLET BY MOUTH TWICE A DAY 60 tablet 3  . fulvestrant (FASLODEX) 250 MG/5ML injection Inject into the muscle every 30 (thirty) days. One injection each buttock over 1-2 minutes. Warm prior to use.    . glipiZIDE (GLUCOTROL XL) 10 MG 24 hr tablet Take 10 mg by mouth 2 (two) times daily.  11  . levothyroxine (SYNTHROID, LEVOTHROID) 25 MCG tablet Take 1 tablet (25 mcg total) by mouth daily before breakfast. 90 tablet 1  . lisinopril (PRINIVIL,ZESTRIL) 10 MG tablet Take 10 mg by mouth every morning.     . loperamide (IMODIUM A-D) 2 MG tablet Take 2 mg by mouth 4 (four) times daily as needed for diarrhea or loose stools.    . ondansetron (ZOFRAN) 8 MG tablet Take 1 tablet (8 mg total) by mouth every 8 (eight) hours as needed for nausea or vomiting. 20 tablet 3  . pantoprazole (PROTONIX) 40 MG tablet Take 1 tablet (  40 mg total) by mouth daily. 30 mins prior to meals; On empty stomach 30 tablet 0  . prochlorperazine (COMPAZINE) 10 MG tablet Take 1 tablet (10 mg total) by mouth every 6 (six) hours as needed for nausea or vomiting. 30 tablet 0  . Semaglutide (OZEMPIC) 0.25 or 0.5 MG/DOSE SOPN Inject 0.25 mg into the skin once a week.    . terbinafine (LAMISIL) 250 MG tablet Take 1 tablet (250 mg total) by mouth daily. 14 tablet 0  . VERZENIO 100 MG tablet TAKE 1 TABLET (100 MG TOTAL) BY MOUTH 2 (TWO) TIMES DAILY. 56 tablet 4   No current facility-administered medications for this visit.     PHYSICAL EXAMINATION: ECOG PERFORMANCE STATUS: 1 - Symptomatic but completely ambulatory  BP (!) 138/91 (BP Location: Left Arm, Patient Position: Sitting, Cuff Size: Normal)   Pulse 98   Temp 97.8 F (36.6 C) (Tympanic)   Wt 179 lb 10.8 oz (81.5 kg)   BMI 32.86 kg/m   Filed Weights   09/26/18 0832   Weight: 179 lb 10.8 oz (81.5 kg)    Physical Exam  Constitutional: She is oriented to person, place, and time and well-developed, well-nourished, and in no distress. Vital signs are normal.  Appears fatigued  HENT:  Head: Normocephalic and atraumatic.  Eyes: Pupils are equal, round, and reactive to light.  Neck: Normal range of motion.  Cardiovascular: Normal rate, regular rhythm and normal heart sounds.  No murmur heard. Pulmonary/Chest: Effort normal and breath sounds normal. She has no wheezes.  Abdominal: Soft. Normal appearance and bowel sounds are normal. She exhibits no distension. There is no abdominal tenderness.  Musculoskeletal: Normal range of motion.        General: No edema.  Neurological: She is alert and oriented to person, place, and time. Gait normal.  Skin: Skin is warm and dry. No rash noted.  Psychiatric: Mood, memory, affect and judgment normal.     LABORATORY DATA:  I have reviewed the data as listed    Component Value Date/Time   NA 131 (L) 09/26/2018 0813   NA 135 11/28/2014 1401   K 4.3 09/26/2018 0813   K 4.7 11/28/2014 1401   CL 99 09/26/2018 0813   CL 99 (L) 11/28/2014 1401   CO2 21 (L) 09/26/2018 0813   CO2 28 11/28/2014 1401   GLUCOSE 233 (H) 09/26/2018 0813   GLUCOSE 215 (H) 11/28/2014 1401   BUN 18 09/26/2018 0813   BUN 16 11/28/2014 1401   CREATININE 1.10 (H) 09/26/2018 0813   CREATININE 0.96 11/28/2014 1401   CALCIUM 9.7 09/26/2018 0813   CALCIUM 9.9 11/28/2014 1401   PROT 7.3 09/26/2018 0813   PROT 7.6 11/28/2014 1401   ALBUMIN 3.7 09/26/2018 0813   ALBUMIN 4.5 11/28/2014 1401   AST 30 09/26/2018 0813   AST 26 11/28/2014 1401   ALT 21 09/26/2018 0813   ALT 28 11/28/2014 1401   ALKPHOS 61 09/26/2018 0813   ALKPHOS 78 11/28/2014 1401   BILITOT 0.5 09/26/2018 0813   BILITOT 0.5 11/28/2014 1401   GFRNONAA 53 (L) 09/26/2018 0813   GFRNONAA >60 11/28/2014 1401   GFRAA >60 09/26/2018 0813   GFRAA >60 11/28/2014 1401    No  results found for: SPEP, UPEP  Lab Results  Component Value Date   WBC 3.3 (L) 09/26/2018   NEUTROABS 1.4 (L) 09/26/2018   HGB 12.3 09/26/2018   HCT 35.4 (L) 09/26/2018   MCV 95.7 09/26/2018   PLT 266 09/26/2018        Chemistry      Component Value Date/Time   NA 131 (L) 09/26/2018 0813   NA 135 11/28/2014 1401   K 4.3 09/26/2018 0813   K 4.7 11/28/2014 1401   CL 99 09/26/2018 0813   CL 99 (L) 11/28/2014 1401   CO2 21 (L) 09/26/2018 0813   CO2 28 11/28/2014 1401   BUN 18 09/26/2018 0813   BUN 16 11/28/2014 1401   CREATININE 1.10 (H) 09/26/2018 0813   CREATININE 0.96 11/28/2014 1401      Component Value Date/Time   CALCIUM 9.7 09/26/2018 0813   CALCIUM 9.9 11/28/2014 1401   ALKPHOS 61 09/26/2018 0813   ALKPHOS 78 11/28/2014 1401   AST 30 09/26/2018 0813   AST 26 11/28/2014 1401   ALT 21 09/26/2018 0813   ALT 28 11/28/2014 1401   BILITOT 0.5 09/26/2018 0813   BILITOT 0.5 11/28/2014 1401       RADIOGRAPHIC STUDIES: I have personally reviewed the radiological images as listed and agreed with the findings in the report. No results found.   ASSESSMENT & PLAN:  Carcinoma of lower-outer quadrant of right breast in female, estrogen receptor positive ([place])  #Recurrent breast cancer-ER positive PR negative ? HER-2/neu-currently on Faslodex plus abema. Oct 30th 2019- CT A/P-NEG-STABLE.    #Continue Faslodex plus Abema 100 BID; tolerating well. Imaging from October 2019 no evidence of progression. Labs today reviewed; acceptable for treatment.  CT chest/abdomen/pelvis ordered.  #Hyponatremia: Sodium 131.  Chronically low.  Encouraged her to stay hydrated.   #Neutropenia: Chronic WBCs 3.3 and ANC 1.4. D/t treatment.  Continue to monitor.  #History of right upper extremity DVT: Stable on Eliquis.  # CKD-III; creatinine 1.10 stable.  Plan  Faslodex today  CT scans prior to next visit. RTC in 4 weeks with labs (CBC/CMP/Ca 27-29) Faslodex, CT scan results and MD  assessment.    Orders Placed This Encounter  Procedures  . CT Abdomen Pelvis W Contrast    Standing Status:   Future    Standing Expiration Date:   09/26/2019    Order Specific Question:   ** REASON FOR EXAM (FREE TEXT)    Answer:   hx of breast cancer    Order Specific Question:   If indicated for the ordered procedure, I authorize the administration of contrast media per Radiology protocol    Answer:   Yes    Order Specific Question:   Preferred imaging location?    Answer:   [facility]    Order Specific Question:   Is Oral Contrast requested for this exam?    Answer:   Yes, Per Radiology protocol    Order Specific Question:   Call Results- Best Contact Number?    Answer:   [phone]    Order Specific Question:   Radiology Contrast Protocol - do NOT remove file path    Answer:   _0 [email or web address]  . CT Chest W Contrast    Standing Status:   Future    Standing Expiration Date:   09/26/2019    Order Specific Question:   ** REASON FOR EXAM (FREE TEXT)    Answer:   hx of breast cancer    Order Specific Question:   If indicated for the ordered procedure, I authorize the administration of contrast media per Radiology protocol    Answer:   Yes    Order Specific Question:   Preferred imaging location?    Answer:   [facility]    Order Specific Question:  Call Results- Best Contact Number?    Answer:   [phone]    Order Specific Question:   Radiology Contrast Protocol - do NOT remove file path    Answer:   [email or web address]   All questions were answered. The patient knows to call the clinic with any problems, questions or concerns.      Jennifer E Burns, NP 09/26/2018 9:48 AM  

## 2018-09-26 NOTE — Assessment & Plan Note (Addendum)
#  Recurrent breast cancer-ER positive PR negative ? HER-2/neu-currently on Faslodex plus abema. Oct 30th 2019- CT A/P-NEG-STABLE.    #Continue Faslodex plus Abema 100 BID; tolerating well. Imaging from October 2019 no evidence of progression. Labs today reviewed; acceptable for treatment.  CT chest/abdomen/pelvis ordered.  #Hyponatremia: Sodium 131.  Chronically low.  Encouraged her to stay hydrated.   #Neutropenia: Chronic WBCs 3.3 and ANC 1.4. D/t treatment.  Continue to monitor.  #History of right upper extremity DVT: Stable on Eliquis.  # CKD-III; creatinine 1.10 stable.  Plan  Faslodex today  CT scans prior to next visit. RTC in 4 weeks with labs (CBC/CMP/Ca 27-29) Faslodex, CT scan results and MD assessment.

## 2018-09-26 NOTE — Patient Instructions (Signed)

## 2018-09-27 LAB — CANCER ANTIGEN 27.29: CA 27.29: 41.9 U/mL — ABNORMAL HIGH (ref 0.0–38.6)

## 2018-10-20 ENCOUNTER — Encounter (INDEPENDENT_AMBULATORY_CARE_PROVIDER_SITE_OTHER): Payer: Self-pay

## 2018-10-20 ENCOUNTER — Ambulatory Visit
Admission: RE | Admit: 2018-10-20 | Discharge: 2018-10-20 | Disposition: A | Discharge status: Home / Self Care | Payer: BLUE CROSS/BLUE SHIELD | Source: Ambulatory Visit | Attending: Oncology | Admitting: Oncology

## 2018-10-20 DIAGNOSIS — C50511 Malignant neoplasm of lower-outer quadrant of right female breast: Secondary | ICD-10-CM

## 2018-10-20 DIAGNOSIS — Z170 Estrogen receptor positive status [ER+]: Secondary | ICD-10-CM | POA: Insufficient documentation

## 2018-10-20 DIAGNOSIS — M8580 Other specified disorders of bone density and structure, unspecified site: Secondary | ICD-10-CM | POA: Insufficient documentation

## 2018-10-20 DIAGNOSIS — C50919 Malignant neoplasm of unspecified site of unspecified female breast: Secondary | ICD-10-CM | POA: Insufficient documentation

## 2018-10-20 MED ORDER — IOHEXOL 300 MG/ML  SOLN
100.0000 mL | Freq: Once | INTRAMUSCULAR | Status: AC | PRN
  Administered 2018-10-20: 100 mL via INTRAVENOUS

## 2018-10-23 ENCOUNTER — Other Ambulatory Visit: Payer: Self-pay | Admitting: Internal Medicine

## 2018-10-23 DIAGNOSIS — C50511 Malignant neoplasm of lower-outer quadrant of right female breast: Secondary | ICD-10-CM

## 2018-10-23 DIAGNOSIS — Z170 Estrogen receptor positive status [ER+]: Principal | ICD-10-CM

## 2018-10-24 ENCOUNTER — Inpatient Hospital Stay: Payer: BLUE CROSS/BLUE SHIELD

## 2018-10-24 ENCOUNTER — Inpatient Hospital Stay: Payer: BLUE CROSS/BLUE SHIELD | Attending: Internal Medicine | Admitting: Internal Medicine

## 2018-10-24 VITALS — BP 120/81 | HR 94 | Temp 98.1°F | Resp 16 | Wt 182.0 lb

## 2018-10-24 DIAGNOSIS — C50511 Malignant neoplasm of lower-outer quadrant of right female breast: Secondary | ICD-10-CM

## 2018-10-24 DIAGNOSIS — Z86718 Personal history of other venous thrombosis and embolism: Secondary | ICD-10-CM

## 2018-10-24 DIAGNOSIS — Z7901 Long term (current) use of anticoagulants: Secondary | ICD-10-CM

## 2018-10-24 DIAGNOSIS — Z170 Estrogen receptor positive status [ER+]: Principal | ICD-10-CM

## 2018-10-24 DIAGNOSIS — Z5111 Encounter for antineoplastic chemotherapy: Secondary | ICD-10-CM | POA: Insufficient documentation

## 2018-10-24 DIAGNOSIS — N183 Chronic kidney disease, stage 3 (moderate): Secondary | ICD-10-CM

## 2018-10-24 LAB — CBC WITH DIFFERENTIAL/PLATELET
ABS IMMATURE GRANULOCYTES: 0.01 10*3/uL (ref 0.00–0.07)
Basophils Absolute: 0.1 10*3/uL (ref 0.0–0.1)
Basophils Relative: 2 %
Eosinophils Absolute: 0.1 10*3/uL (ref 0.0–0.5)
Eosinophils Relative: 2 %
HCT: 34.9 % — ABNORMAL LOW (ref 36.0–46.0)
Hemoglobin: 11.9 g/dL — ABNORMAL LOW (ref 12.0–15.0)
Immature Granulocytes: 0 %
Lymphocytes Relative: 44 %
Lymphs Abs: 1.7 10*3/uL (ref 0.7–4.0)
MCH: 32.2 pg (ref 26.0–34.0)
MCHC: 34.1 g/dL (ref 30.0–36.0)
MCV: 94.6 fL (ref 80.0–100.0)
Monocytes Absolute: 0.2 10*3/uL (ref 0.1–1.0)
Monocytes Relative: 5 %
Neutro Abs: 1.8 10*3/uL (ref 1.7–7.7)
Neutrophils Relative %: 47 %
Platelets: 251 10*3/uL (ref 150–400)
RBC: 3.69 MIL/uL — AB (ref 3.87–5.11)
RDW: 12.9 % (ref 11.5–15.5)
WBC: 3.8 10*3/uL — ABNORMAL LOW (ref 4.0–10.5)
nRBC: 0 % (ref 0.0–0.2)

## 2018-10-24 LAB — COMPREHENSIVE METABOLIC PANEL
ALT: 23 U/L (ref 0–44)
AST: 39 U/L (ref 15–41)
Albumin: 3.8 g/dL (ref 3.5–5.0)
Alkaline Phosphatase: 56 U/L (ref 38–126)
Anion gap: 11 (ref 5–15)
BUN: 18 mg/dL (ref 8–23)
CO2: 23 mmol/L (ref 22–32)
Calcium: 9 mg/dL (ref 8.9–10.3)
Chloride: 99 mmol/L (ref 98–111)
Creatinine, Ser: 1.13 mg/dL — ABNORMAL HIGH (ref 0.44–1.00)
GFR calc Af Amer: 59 mL/min — ABNORMAL LOW (ref >60)
GFR calc non Af Amer: 51 mL/min — ABNORMAL LOW (ref >60)
Glucose, Bld: 301 mg/dL — ABNORMAL HIGH (ref 70–99)
POTASSIUM: 3.9 mmol/L (ref 3.5–5.1)
Sodium: 133 mmol/L — ABNORMAL LOW (ref 135–145)
Total Bilirubin: 0.5 mg/dL (ref 0.3–1.2)
Total Protein: 7.6 g/dL (ref 6.5–8.1)

## 2018-10-24 MED ORDER — FULVESTRANT 250 MG/5ML IM SOLN
500.0000 mg | Freq: Once | INTRAMUSCULAR | Status: AC
  Administered 2018-10-24: 500 mg via INTRAMUSCULAR
  Filled 2018-10-24: qty 10

## 2018-10-24 MED FILL — VERZENIO 100 MG TAB: 100 | 28 days supply | Qty: 56 | Fill #1

## 2018-10-24 NOTE — Progress Notes (Signed)
.[facility] OFFICE PROGRESS NOTE  Patient Care Team: Karen Kitchens, MD as PCP - General (Family Medicine) Neldon Mc, MD (General Surgery) Everlene Farrier, MD (Obstetrics and Gynecology) Noreene Filbert, MD Forest Gleason, MD (Inactive) (Unknown Physician Specialty)  Cancer Staging Carcinoma of lower-outer quadrant of right breast in female, estrogen receptor positive [facility]) Staging form: Breast, AJCC 7th Edition - Pathologic: ypT1c,ypN2a, MX - Signed by Haywood Lasso, MD on 03/10/2012    Oncology History   # DEC 2012- RIGHT BREAST CA T2 N2 M0 tumor from biopsy.  Estrogen receptor positive, Progesterone receptor positive.  Current receptor negative by FISH 2. Neoadjuvant chemotherapy started in December of 28 with Cytoxan Adriamycin 3. Started on Taxol weekly chemotherapy. 4. Patient finished 12  cycles of Taxol chemotherapy in May of 2013.     5. Status post right modified radical mastectomy [Dr.Bowers; [place]] June of 2013, ypT1c  yp N2  MO. started also on Lerazole    7. Radiation therapy to the right breast (September of 2013).  Lymph node was positive for HER-2 receptor gene amplification of 2.22.  Will proceed with Herceptin treatment starting in September of 2013.   8.Patient has finished Herceptin (maintenance therapy) in August of 2014 8. Start patient on letrozole from November, 2013. 9. Patient started on Herceptin in September 2013.   10Patient finished 12 months of Herceptin therapy on August, 2014  # 6. Status post left side prophylactic mastectomy.  #Late MAY 2018-RECURRENCE BREAST CA- ER positive/PR negative; ?? HER-2/neu- [biopsy- proven-mediastinal lymph node; in suff for her 2 testing].  [elevated Tumor marker- CT/PET- uptake in Right Mediastinal LN; Sternum [June 2018 [facility]- Dr.Kasa]   # March 10 2017- faslodex + Abema; OCT 5th CT-PR of mediastinal LN --------------------------------------------    DIAGNOSIS: [ BREAST CANCER-  ER/PR/HER2 NEU POS  STAGE:  4   ;GOALS: PALLIATIVE  CURRENT/MOST RECENT THERAPY - ABEMA + FASLODEX      Carcinoma of lower-outer quadrant of right breast in female, estrogen receptor positive ([place])   INTERVAL HISTORY:  Kimberly [name] 63 y.o.  female pleasant patient above history of metastatic ER PR positive HER-2/neu positive breast cancer currently on abema+ Faslodex is here for follow-up/review the results of the CT scan.  Patient's appetite is good.  No weight loss.  No new shortness of breath or cough.  She continues to be on Eliquis given her history of right upper extremity DVT.  No headaches.  No fevers chills.  No diarrhea.  Mild to moderate fatigue.  No headaches no fever no chills.  Mild fatigue.  Mild diarrhea. Review of Systems  Constitutional: Positive for malaise/fatigue. Negative for chills, diaphoresis, fever and weight loss.  HENT: Negative for nosebleeds and sore throat.   Eyes: Negative for double vision.  Respiratory: Negative for cough, hemoptysis, sputum production, shortness of breath and wheezing.   Cardiovascular: Negative for chest pain, palpitations, orthopnea and leg swelling.  Gastrointestinal: Positive for diarrhea. Negative for abdominal pain, blood in stool, constipation, heartburn, melena, nausea and vomiting.  Genitourinary: Negative for dysuria, frequency and urgency.  Musculoskeletal: Positive for back pain and joint pain.  Skin: Positive for itching and rash.  Neurological: Negative for dizziness, tingling, focal weakness, weakness and headaches.  Endo/Heme/Allergies: Does not bruise/bleed easily.  Psychiatric/Behavioral: Negative for depression. The patient is not nervous/anxious and does not have insomnia.       PAST MEDICAL HISTORY :  Past Medical History:  Diagnosis Date  . Arthritis   . Cancer  of lower-outer quadrant of female breast ([place]) 08/06/2011   RIGHT   . Diabetes mellitus ([place]) 03/24/2012  . High cholesterol   . History  of kidney stones   . Hx of bilateral breast implants   . Hypertension   . PONV (postoperative nausea and vomiting)   . Type II diabetes mellitus ([facility])    fasting 140-150  . Vertigo    LAST WEEK  . Vertigo 01/2017    PAST SURGICAL HISTORY :   Past Surgical History:  Procedure Laterality Date  . BREAST BIOPSY  07/2011   right  . BREAST RECONSTRUCTION  03/07/2012   Procedure: BREAST RECONSTRUCTION;  Surgeon: Crissie Reese, MD;  Location: [facility];  Service: Plastics;  Laterality: Left;  BREAST RECONSTRUCTION WITH PLACEMENT OF TISSUE EXPANDER TO LEFT BREAST  . BREAST RECONSTRUCTION  02/06/2013  . CESAREAN SECTION  [id number]  . ENDOBRONCHIAL ULTRASOUND N/A 02/24/2017   Procedure: ENDOBRONCHIAL ULTRASOUND;  Surgeon: [name], MD;  Location: [facility];  Service: Cardiopulmonary;  Laterality: N/A;  . LATISSIMUS FLAP TO BREAST Right 02/06/2013   Procedure: LATISSIMUS FLAP TO RIGHT BREAST WITH IMPLANT;  Surgeon: Crissie Reese, MD;  Location: [facility];  Service: Plastics;  Laterality: Right;  . MASTECTOMY  03/07/12   modified right; total left  . MODIFIED MASTECTOMY  03/07/2012   Procedure: MODIFIED MASTECTOMY;  Surgeon: Haywood Lasso, MD;  Location: [facility];  Service: General;  Laterality: Right;  . PORTACATH PLACEMENT  07/2011  . SCAR REVISION  03/30/2012   Procedure: SCAR REVISION;  Surgeon: Haywood Lasso, MD;  Location: [facility];  Service: General;  Laterality: Right;  CLOSURE OF MASTECTOMY INCISION  . WISDOM TOOTH EXTRACTION      FAMILY HISTORY :   Family History  Problem Relation Age of Onset  . Cancer Mother        breast    SOCIAL HISTORY:   Social History   Tobacco Use  . Smoking status: Never Smoker  . Smokeless tobacco: Never Used  Substance Use Topics  . Alcohol use: No  . Drug use: No    ALLERGIES:  is allergic to sulfa antibiotics.  MEDICATIONS:  Current Outpatient Medications  Medication Sig Dispense Refill  . aspirin (ASPIRIN CHILDRENS) 81 MG  chewable tablet Chew 1 tablet (81 mg total) by mouth daily. 120 tablet 0  . atorvastatin (LIPITOR) 20 MG tablet Take 20 mg by mouth every morning.    . Calcium Carbonate-Vitamin D (CALCIUM 600 + D PO) Take 1 tablet by mouth 2 (two) times daily.    Marland Kitchen ELIQUIS 5 MG TABS tablet TAKE 1 TABLET BY MOUTH TWICE A DAY 60 tablet 3  . fulvestrant (FASLODEX) 250 MG/5ML injection Inject into the muscle every 30 (thirty) days. One injection each buttock over 1-2 minutes. Warm prior to use.    Marland Kitchen glipiZIDE (GLUCOTROL XL) 10 MG 24 hr tablet Take 10 mg by mouth 2 (two) times daily.  11  . levothyroxine (SYNTHROID, LEVOTHROID) 25 MCG tablet Take 1 tablet (25 mcg total) by mouth daily before breakfast. 90 tablet 1  . lisinopril (PRINIVIL,ZESTRIL) 10 MG tablet Take 10 mg by mouth every morning.     . loperamide (IMODIUM A-D) 2 MG tablet Take 2 mg by mouth 4 (four) times daily as needed for diarrhea or loose stools.    . ondansetron (ZOFRAN) 8 MG tablet Take 1 tablet (8 mg total) by mouth every 8 (eight) hours as needed for nausea or vomiting. 20 tablet 3  .  pantoprazole (PROTONIX) 40 MG tablet Take 1 tablet (40 mg total) by mouth daily. 30 mins prior to meals; On empty stomach 30 tablet 0  . prochlorperazine (COMPAZINE) 10 MG tablet Take 1 tablet (10 mg total) by mouth every 6 (six) hours as needed for nausea or vomiting. 30 tablet 0  . Semaglutide (OZEMPIC) 0.25 or 0.5 MG/DOSE SOPN Inject 0.25 mg into the skin once a week.    . terbinafine (LAMISIL) 250 MG tablet Take 1 tablet (250 mg total) by mouth daily. 14 tablet 0  . VERZENIO 100 MG tablet TAKE 1 TABLET (100 MG TOTAL) BY MOUTH 2 (TWO) TIMES DAILY. 56 tablet 4   No current facility-administered medications for this visit.     PHYSICAL EXAMINATION: ECOG PERFORMANCE STATUS: 1 - Symptomatic but completely ambulatory  BP 120/81 (BP Location: Left Arm, Patient Position: Sitting, Cuff Size: Normal)   Pulse 94   Temp 98.1 F (36.7 C) (Tympanic)   Resp 16   Wt 182  lb (82.6 kg)   BMI 33.29 kg/m   Filed Weights   10/24/18 1511  Weight: 182 lb (82.6 kg)    Physical Exam  Constitutional: She is oriented to person, place, and time and well-developed, well-nourished, and in no distress.  She is alone.   HENT:  Head: Normocephalic and atraumatic.  Mouth/Throat: Oropharynx is clear and moist. No oropharyngeal exudate.  Eyes: Pupils are equal, round, and reactive to light.  Neck: Normal range of motion. Neck supple.  Cardiovascular: Normal rate and regular rhythm.  Pulmonary/Chest: No respiratory distress. She has no wheezes.  Abdominal: Soft. Bowel sounds are normal. She exhibits no distension and no mass. There is no abdominal tenderness. There is no rebound and no guarding.  Musculoskeletal: Normal range of motion.        General: No tenderness or edema.     Comments: Right arm swollen compared to left.  No tenderness no erythema.  Neurological: She is alert and oriented to person, place, and time.  Skin: Skin is warm.  Psychiatric: Affect normal.       LABORATORY DATA:  I have reviewed the data as listed    Component Value Date/Time   NA 133 (L) 10/24/2018 1410   NA 135 11/28/2014 1401   K 3.9 10/24/2018 1410   K 4.7 11/28/2014 1401   CL 99 10/24/2018 1410   CL 99 (L) 11/28/2014 1401   CO2 23 10/24/2018 1410   CO2 28 11/28/2014 1401   GLUCOSE 301 (H) 10/24/2018 1410   GLUCOSE 215 (H) 11/28/2014 1401   BUN 18 10/24/2018 1410   BUN 16 11/28/2014 1401   CREATININE 1.13 (H) 10/24/2018 1410   CREATININE 0.96 11/28/2014 1401   CALCIUM 9.0 10/24/2018 1410   CALCIUM 9.9 11/28/2014 1401   PROT 7.6 10/24/2018 1410   PROT 7.6 11/28/2014 1401   ALBUMIN 3.8 10/24/2018 1410   ALBUMIN 4.5 11/28/2014 1401   AST 39 10/24/2018 1410   AST 26 11/28/2014 1401   ALT 23 10/24/2018 1410   ALT 28 11/28/2014 1401   ALKPHOS 56 10/24/2018 1410   ALKPHOS 78 11/28/2014 1401   BILITOT 0.5 10/24/2018 1410   BILITOT 0.5 11/28/2014 1401   GFRNONAA 51  (L) 10/24/2018 1410   GFRNONAA >60 11/28/2014 1401   GFRAA 59 (L) 10/24/2018 1410   GFRAA >60 11/28/2014 1401    No results found for: SPEP, UPEP  Lab Results  Component Value Date   WBC 3.8 (L) 10/24/2018   NEUTROABS 1.8  10/24/2018   HGB 11.9 (L) 10/24/2018   HCT 34.9 (L) 10/24/2018   MCV 94.6 10/24/2018   PLT 251 10/24/2018      Chemistry      Component Value Date/Time   NA 133 (L) 10/24/2018 1410   NA 135 11/28/2014 1401   K 3.9 10/24/2018 1410   K 4.7 11/28/2014 1401   CL 99 10/24/2018 1410   CL 99 (L) 11/28/2014 1401   CO2 23 10/24/2018 1410   CO2 28 11/28/2014 1401   BUN 18 10/24/2018 1410   BUN 16 11/28/2014 1401   CREATININE 1.13 (H) 10/24/2018 1410   CREATININE 0.96 11/28/2014 1401      Component Value Date/Time   CALCIUM 9.0 10/24/2018 1410   CALCIUM 9.9 11/28/2014 1401   ALKPHOS 56 10/24/2018 1410   ALKPHOS 78 11/28/2014 1401   AST 39 10/24/2018 1410   AST 26 11/28/2014 1401   ALT 23 10/24/2018 1410   ALT 28 11/28/2014 1401   BILITOT 0.5 10/24/2018 1410   BILITOT 0.5 11/28/2014 1401       RADIOGRAPHIC STUDIES: I have personally reviewed the radiological images as listed and agreed with the findings in the report. No results found.   ASSESSMENT & PLAN:  Carcinoma of lower-outer quadrant of right breast in female, estrogen receptor positive ([place])  #Recurrent breast cancer-ER positive PR negative ? HER-2/neu-currently on Faslodex plus abema.FEB 2020- CT A/P-NEG-STABLE.    #Continue Faslodex plus Abema 100 BID; Labs today reviewed;  acceptable for treatment today.   #History of right upper extremity DVT: STABLE- continue on Eliquis.  # CKD-III; creatinine 1.10 stable.  # rash in perineal region- await evaluation with Gyn next week.   DISPOSITION: # Faslodex today  # follow up 4 weeks- MD/ labs (CBC/CMP/Ca 27-29) Faslodex- Dr.B  # I reviewed the blood work- with the patient in detail; also reviewed the imaging independently [as summarized  above]; and with the patient in detail.     No orders of the defined types were placed in this encounter.  All questions were answered. The patient knows to call the clinic with any problems, questions or concerns.      [name], MD 10/24/2018 4:45 PM

## 2018-10-24 NOTE — Assessment & Plan Note (Addendum)
#  Recurrent breast cancer-ER positive PR negative ? HER-2/neu-currently on Faslodex plus abema.FEB 2020- CT A/P-NEG-STABLE.    #Continue Faslodex plus Abema 100 BID; Labs today reviewed;  acceptable for treatment today.   #History of right upper extremity DVT: STABLE- continue on Eliquis.  # CKD-III; creatinine 1.10 stable.  # rash in perineal region- await evaluation with Gyn next week.   DISPOSITION: # Faslodex today  # follow up 4 weeks- MD/ labs (CBC/CMP/Ca 27-29) Faslodex- Dr.B  # I reviewed the blood work- with the patient in detail; also reviewed the imaging independently [as summarized above]; and with the patient in detail.

## 2018-10-25 LAB — CANCER ANTIGEN 27.29: CAN 27.29: 33.7 U/mL (ref 0.0–38.6)

## 2018-10-27 DIAGNOSIS — E7800 Pure hypercholesterolemia, unspecified: Secondary | ICD-10-CM | POA: Insufficient documentation

## 2018-10-27 DIAGNOSIS — E079 Disorder of thyroid, unspecified: Secondary | ICD-10-CM | POA: Insufficient documentation

## 2018-11-20 ENCOUNTER — Other Ambulatory Visit: Payer: Self-pay

## 2018-11-20 MED FILL — VERZENIO 100 MG TAB: 100 | 28 days supply | Qty: 56 | Fill #2

## 2018-11-21 ENCOUNTER — Other Ambulatory Visit: Payer: Self-pay

## 2018-11-21 ENCOUNTER — Inpatient Hospital Stay: Payer: BLUE CROSS/BLUE SHIELD

## 2018-11-21 ENCOUNTER — Inpatient Hospital Stay (HOSPITAL_BASED_OUTPATIENT_CLINIC_OR_DEPARTMENT_OTHER): Payer: BLUE CROSS/BLUE SHIELD | Admitting: Internal Medicine

## 2018-11-21 ENCOUNTER — Inpatient Hospital Stay: Payer: BLUE CROSS/BLUE SHIELD | Attending: Internal Medicine

## 2018-11-21 ENCOUNTER — Encounter: Payer: Self-pay | Admitting: Internal Medicine

## 2018-11-21 VITALS — BP 112/80 | HR 100 | Resp 16 | Wt 179.8 lb

## 2018-11-21 DIAGNOSIS — C50511 Malignant neoplasm of lower-outer quadrant of right female breast: Secondary | ICD-10-CM

## 2018-11-21 DIAGNOSIS — N183 Chronic kidney disease, stage 3 (moderate): Secondary | ICD-10-CM | POA: Diagnosis not present

## 2018-11-21 DIAGNOSIS — R5383 Other fatigue: Secondary | ICD-10-CM

## 2018-11-21 DIAGNOSIS — Z170 Estrogen receptor positive status [ER+]: Secondary | ICD-10-CM | POA: Diagnosis not present

## 2018-11-21 DIAGNOSIS — Z7901 Long term (current) use of anticoagulants: Secondary | ICD-10-CM

## 2018-11-21 DIAGNOSIS — Z5111 Encounter for antineoplastic chemotherapy: Secondary | ICD-10-CM | POA: Insufficient documentation

## 2018-11-21 DIAGNOSIS — Z86718 Personal history of other venous thrombosis and embolism: Secondary | ICD-10-CM

## 2018-11-21 LAB — COMPREHENSIVE METABOLIC PANEL
ALT: 25 U/L (ref 0–44)
AST: 37 U/L (ref 15–41)
Albumin: 4.1 g/dL (ref 3.5–5.0)
Alkaline Phosphatase: 63 U/L (ref 38–126)
Anion gap: 9 (ref 5–15)
BUN: 20 mg/dL (ref 8–23)
CO2: 22 mmol/L (ref 22–32)
Calcium: 9.8 mg/dL (ref 8.9–10.3)
Chloride: 101 mmol/L (ref 98–111)
Creatinine, Ser: 1.13 mg/dL — ABNORMAL HIGH (ref 0.44–1.00)
GFR calc Af Amer: 59 mL/min — ABNORMAL LOW (ref >60)
GFR calc non Af Amer: 51 mL/min — ABNORMAL LOW (ref >60)
Glucose, Bld: 211 mg/dL — ABNORMAL HIGH (ref 70–99)
Potassium: 4.4 mmol/L (ref 3.5–5.1)
Sodium: 132 mmol/L — ABNORMAL LOW (ref 135–145)
Total Bilirubin: 0.5 mg/dL (ref 0.3–1.2)
Total Protein: 7.5 g/dL (ref 6.5–8.1)

## 2018-11-21 LAB — CBC WITH DIFFERENTIAL/PLATELET
Abs Immature Granulocytes: 0 10*3/uL (ref 0.00–0.07)
Basophils Absolute: 0.1 10*3/uL (ref 0.0–0.1)
Basophils Relative: 2 %
Eosinophils Absolute: 0 10*3/uL (ref 0.0–0.5)
Eosinophils Relative: 1 %
HCT: 36.4 % (ref 36.0–46.0)
Hemoglobin: 12.5 g/dL (ref 12.0–15.0)
Immature Granulocytes: 0 %
LYMPHS PCT: 46 %
Lymphs Abs: 1.8 10*3/uL (ref 0.7–4.0)
MCH: 32.6 pg (ref 26.0–34.0)
MCHC: 34.3 g/dL (ref 30.0–36.0)
MCV: 94.8 fL (ref 80.0–100.0)
Monocytes Absolute: 0.2 10*3/uL (ref 0.1–1.0)
Monocytes Relative: 6 %
Neutro Abs: 1.7 10*3/uL (ref 1.7–7.7)
Neutrophils Relative %: 45 %
Platelets: 254 10*3/uL (ref 150–400)
RBC: 3.84 MIL/uL — ABNORMAL LOW (ref 3.87–5.11)
RDW: 13.2 % (ref 11.5–15.5)
WBC: 3.8 10*3/uL — ABNORMAL LOW (ref 4.0–10.5)
nRBC: 0 % (ref 0.0–0.2)

## 2018-11-21 MED ORDER — FULVESTRANT 250 MG/5ML IM SOLN
500.0000 mg | Freq: Once | INTRAMUSCULAR | Status: AC
  Administered 2018-11-21: 500 mg via INTRAMUSCULAR
  Filled 2018-11-21: qty 10

## 2018-11-21 NOTE — Assessment & Plan Note (Addendum)
#  Recurrent breast cancer-ER positive PR negative ? HER-2/neu-currently on Faslodex plus abema.FEB 2020- CT A/P-NEG--stable  #Continue Faslodex plus Abema 100 BID; Labs today reviewed;  acceptable for treatment today.   #History of right upper extremity DVT:on Eliquis-stable  # CKD-III; creatinine 1.10; stable  # rash in perineal region-s/p lamisil-improved  # # Educated the patient regarding novel coronavirus-modes of transmission/risks; and measures to avoid infection.   DISPOSITION: # Faslodex today  # follow up 4 weeks- MD/ labs (CBC/CMP/Ca 27-29) Faslodex- Dr.B

## 2018-11-21 NOTE — Progress Notes (Signed)
.[place] OFFICE PROGRESS NOTE  Patient Care Team: Karen Kitchens, MD as PCP - General (Family Medicine) Neldon Mc, MD (General Surgery) Everlene Farrier, MD (Obstetrics and Gynecology) Noreene Filbert, MD Forest Gleason, MD (Inactive) (Unknown Physician Specialty)  Cancer Staging Carcinoma of lower-outer quadrant of right breast in female, estrogen receptor positive [facility]) Staging form: Breast, AJCC 7th Edition - Pathologic: ypT1c,ypN2a, MX - Signed by Haywood Lasso, MD on 03/10/2012    Oncology History   # DEC 2012- RIGHT BREAST CA T2 N2 M0 tumor from biopsy.  Estrogen receptor positive, Progesterone receptor positive.  Current receptor negative by FISH 2. Neoadjuvant chemotherapy started in December of 28 with Cytoxan Adriamycin 3. Started on Taxol weekly chemotherapy. 4. Patient finished 12  cycles of Taxol chemotherapy in May of 2013.     5. Status post right modified radical mastectomy [Dr.Bowers; [place]] June of 2013, ypT1c  yp N2  MO. started also on Lerazole    7. Radiation therapy to the right breast (September of 2013).  Lymph node was positive for HER-2 receptor gene amplification of 2.22.  Will proceed with Herceptin treatment starting in September of 2013.   8.Patient has finished Herceptin (maintenance therapy) in August of 2014 8. Start patient on letrozole from November, 2013. 9. Patient started on Herceptin in September 2013.   10Patient finished 12 months of Herceptin therapy on August, 2014  # 6. Status post left side prophylactic mastectomy.  #Late MAY 2018-RECURRENCE BREAST CA- ER positive/PR negative; ?? HER-2/neu- [biopsy- proven-mediastinal lymph node; in suff for her 2 testing].  [elevated Tumor marker- CT/PET- uptake in Right Mediastinal LN; Sternum [June 2018 EBUS- Dr.Kasa]   # March 10 2017- faslodex + Abema; OCT 5th CT-PR of mediastinal LN --------------------------------------------    DIAGNOSIS: [ BREAST CANCER-  ER/PR/HER2 NEU POS  STAGE:  4   ;GOALS: PALLIATIVE  CURRENT/MOST RECENT THERAPY - ABEMA + FASLODEX      Carcinoma of lower-outer quadrant of right breast in female, estrogen receptor positive ([place])   INTERVAL HISTORY:  Kimberly [name] 63 y.o.  female pleasant patient above history of metastatic ER PR positive HER-2/neu positive breast cancer currently on abema+ Faslodex is here for follow-up.  Patient denies any nausea vomiting but appetite is good but no weight loss.  No cough.  Diarrhea improved.  Mild to moderate fatigue.  Rash in the perineum has resolved.  Review of Systems  Constitutional: Positive for malaise/fatigue. Negative for chills, diaphoresis, fever and weight loss.  HENT: Negative for nosebleeds and sore throat.   Eyes: Negative for double vision.  Respiratory: Negative for cough, hemoptysis, sputum production, shortness of breath and wheezing.   Cardiovascular: Negative for chest pain, palpitations, orthopnea and leg swelling.  Gastrointestinal: Positive for diarrhea. Negative for abdominal pain, blood in stool, constipation, heartburn, melena, nausea and vomiting.  Genitourinary: Negative for dysuria, frequency and urgency.  Musculoskeletal: Positive for back pain and joint pain.  Neurological: Negative for dizziness, tingling, focal weakness, weakness and headaches.  Endo/Heme/Allergies: Does not bruise/bleed easily.  Psychiatric/Behavioral: Negative for depression. The patient is not nervous/anxious and does not have insomnia.       PAST MEDICAL HISTORY :  Past Medical History:  Diagnosis Date  . Arthritis   . Cancer of lower-outer quadrant of female breast ([place]) 08/06/2011   RIGHT   . Diabetes mellitus ([place]) 03/24/2012  . High cholesterol   . History of kidney stones   . Hx of bilateral breast implants   .  Hypertension   . PONV (postoperative nausea and vomiting)   . Type II diabetes mellitus ([facility])    fasting 140-150  . Vertigo    LAST WEEK  .  Vertigo 01/2017    PAST SURGICAL HISTORY :   Past Surgical History:  Procedure Laterality Date  . BREAST BIOPSY  07/2011   right  . BREAST RECONSTRUCTION  03/07/2012   Procedure: BREAST RECONSTRUCTION;  Surgeon: Crissie Reese, MD;  Location: [place];  Service: Plastics;  Laterality: Left;  BREAST RECONSTRUCTION WITH PLACEMENT OF TISSUE EXPANDER TO LEFT BREAST  . BREAST RECONSTRUCTION  02/06/2013  . CESAREAN SECTION  [id number]  . ENDOBRONCHIAL ULTRASOUND N/A 02/24/2017   Procedure: ENDOBRONCHIAL ULTRASOUND;  Surgeon: [name], MD;  Location: [facility];  Service: Cardiopulmonary;  Laterality: N/A;  . LATISSIMUS FLAP TO BREAST Right 02/06/2013   Procedure: LATISSIMUS FLAP TO RIGHT BREAST WITH IMPLANT;  Surgeon: Crissie Reese, MD;  Location: [place];  Service: Plastics;  Laterality: Right;  . MASTECTOMY  03/07/12   modified right; total left  . MODIFIED MASTECTOMY  03/07/2012   Procedure: MODIFIED MASTECTOMY;  Surgeon: Haywood Lasso, MD;  Location: [place];  Service: General;  Laterality: Right;  . PORTACATH PLACEMENT  07/2011  . SCAR REVISION  03/30/2012   Procedure: SCAR REVISION;  Surgeon: Haywood Lasso, MD;  Location: [place];  Service: General;  Laterality: Right;  CLOSURE OF MASTECTOMY INCISION  . WISDOM TOOTH EXTRACTION      FAMILY HISTORY :   Family History  Problem Relation Age of Onset  . Cancer Mother        breast    SOCIAL HISTORY:   Social History   Tobacco Use  . Smoking status: Never Smoker  . Smokeless tobacco: Never Used  Substance Use Topics  . Alcohol use: No  . Drug use: No    ALLERGIES:  is allergic to sulfa antibiotics.  MEDICATIONS:  Current Outpatient Medications  Medication Sig Dispense Refill  . aspirin (ASPIRIN CHILDRENS) 81 MG chewable tablet Chew 1 tablet (81 mg total) by mouth daily. 120 tablet 0  . atorvastatin (LIPITOR) 20 MG tablet Take 20 mg by mouth every morning.    . Calcium Carbonate-Vitamin D (CALCIUM 600 + D PO) Take  1 tablet by mouth 2 (two) times daily.    Marland Kitchen ELIQUIS 5 MG TABS tablet TAKE 1 TABLET BY MOUTH TWICE A DAY 60 tablet 3  . fulvestrant (FASLODEX) 250 MG/5ML injection Inject into the muscle every 30 (thirty) days. One injection each buttock over 1-2 minutes. Warm prior to use.    Marland Kitchen glipiZIDE (GLUCOTROL XL) 10 MG 24 hr tablet Take 10 mg by mouth 2 (two) times daily.  11  . levothyroxine (SYNTHROID, LEVOTHROID) 25 MCG tablet Take 1 tablet (25 mcg total) by mouth daily before breakfast. 90 tablet 1  . lisinopril (PRINIVIL,ZESTRIL) 10 MG tablet Take 10 mg by mouth every morning.     . loperamide (IMODIUM A-D) 2 MG tablet Take 2 mg by mouth 4 (four) times daily as needed for diarrhea or loose stools.    . ondansetron (ZOFRAN) 8 MG tablet Take 1 tablet (8 mg total) by mouth every 8 (eight) hours as needed for nausea or vomiting. 20 tablet 3  . pantoprazole (PROTONIX) 40 MG tablet Take 1 tablet (40 mg total) by mouth daily. 30 mins prior to meals; On empty stomach 30 tablet 0  . prochlorperazine (COMPAZINE) 10 MG tablet Take 1 tablet (10 mg total)  by mouth every 6 (six) hours as needed for nausea or vomiting. 30 tablet 0  . Semaglutide (OZEMPIC) 0.25 or 0.5 MG/DOSE SOPN Inject 0.25 mg into the skin once a week.    . terbinafine (LAMISIL) 250 MG tablet Take 1 tablet (250 mg total) by mouth daily. 14 tablet 0  . VERZENIO 100 MG tablet TAKE 1 TABLET (100 MG TOTAL) BY MOUTH 2 (TWO) TIMES DAILY. 56 tablet 4   No current facility-administered medications for this visit.     PHYSICAL EXAMINATION: ECOG PERFORMANCE STATUS: 1 - Symptomatic but completely ambulatory  BP 112/80 (BP Location: Left Arm, Patient Position: Sitting, Cuff Size: Normal)   Pulse 100   Resp 16   Wt 179 lb 12.8 oz (81.6 kg)   BMI 32.89 kg/m   Filed Weights   11/21/18 1131  Weight: 179 lb 12.8 oz (81.6 kg)    Physical Exam  Constitutional: She is oriented to person, place, and time and well-developed, well-nourished, and in no  distress.  She is alone.   HENT:  Head: Normocephalic and atraumatic.  Mouth/Throat: Oropharynx is clear and moist. No oropharyngeal exudate.  Eyes: Pupils are equal, round, and reactive to light.  Neck: Normal range of motion. Neck supple.  Cardiovascular: Normal rate and regular rhythm.  Pulmonary/Chest: No respiratory distress. She has no wheezes.  Abdominal: Soft. Bowel sounds are normal. She exhibits no distension and no mass. There is no abdominal tenderness. There is no rebound and no guarding.  Musculoskeletal: Normal range of motion.        General: No tenderness or edema.     Comments: Right arm swollen compared to left.  No tenderness no erythema.  Neurological: She is alert and oriented to person, place, and time.  Skin: Skin is warm.  Psychiatric: Affect normal.       LABORATORY DATA:  I have reviewed the data as listed    Component Value Date/Time   NA 132 (L) 11/21/2018 1020   NA 135 11/28/2014 1401   K 4.4 11/21/2018 1020   K 4.7 11/28/2014 1401   CL 101 11/21/2018 1020   CL 99 (L) 11/28/2014 1401   CO2 22 11/21/2018 1020   CO2 28 11/28/2014 1401   GLUCOSE 211 (H) 11/21/2018 1020   GLUCOSE 215 (H) 11/28/2014 1401   BUN 20 11/21/2018 1020   BUN 16 11/28/2014 1401   CREATININE 1.13 (H) 11/21/2018 1020   CREATININE 0.96 11/28/2014 1401   CALCIUM 9.8 11/21/2018 1020   CALCIUM 9.9 11/28/2014 1401   PROT 7.5 11/21/2018 1020   PROT 7.6 11/28/2014 1401   ALBUMIN 4.1 11/21/2018 1020   ALBUMIN 4.5 11/28/2014 1401   AST 37 11/21/2018 1020   AST 26 11/28/2014 1401   ALT 25 11/21/2018 1020   ALT 28 11/28/2014 1401   ALKPHOS 63 11/21/2018 1020   ALKPHOS 78 11/28/2014 1401   BILITOT 0.5 11/21/2018 1020   BILITOT 0.5 11/28/2014 1401   GFRNONAA 51 (L) 11/21/2018 1020   GFRNONAA >60 11/28/2014 1401   GFRAA 59 (L) 11/21/2018 1020   GFRAA >60 11/28/2014 1401    No results found for: SPEP, UPEP  Lab Results  Component Value Date   WBC 3.8 (L) 11/21/2018    NEUTROABS 1.7 11/21/2018   HGB 12.5 11/21/2018   HCT 36.4 11/21/2018   MCV 94.8 11/21/2018   PLT 254 11/21/2018      Chemistry      Component Value Date/Time   NA 132 (L) 11/21/2018 1020  NA 135 11/28/2014 1401   K 4.4 11/21/2018 1020   K 4.7 11/28/2014 1401   CL 101 11/21/2018 1020   CL 99 (L) 11/28/2014 1401   CO2 22 11/21/2018 1020   CO2 28 11/28/2014 1401   BUN 20 11/21/2018 1020   BUN 16 11/28/2014 1401   CREATININE 1.13 (H) 11/21/2018 1020   CREATININE 0.96 11/28/2014 1401      Component Value Date/Time   CALCIUM 9.8 11/21/2018 1020   CALCIUM 9.9 11/28/2014 1401   ALKPHOS 63 11/21/2018 1020   ALKPHOS 78 11/28/2014 1401   AST 37 11/21/2018 1020   AST 26 11/28/2014 1401   ALT 25 11/21/2018 1020   ALT 28 11/28/2014 1401   BILITOT 0.5 11/21/2018 1020   BILITOT 0.5 11/28/2014 1401       RADIOGRAPHIC STUDIES: I have personally reviewed the radiological images as listed and agreed with the findings in the report. No results found.   ASSESSMENT & PLAN:  Carcinoma of lower-outer quadrant of right breast in female, estrogen receptor positive ([place])  #Recurrent breast cancer-ER positive PR negative ? HER-2/neu-currently on Faslodex plus abema.FEB 2020- CT A/P-NEG--stable  #Continue Faslodex plus Abema 100 BID; Labs today reviewed;  acceptable for treatment today.   #History of right upper extremity DVT:on Eliquis-stable  # CKD-III; creatinine 1.10; stable  # rash in perineal region-s/p lamisil-improved  # # Educated the patient regarding novel coronavirus-modes of transmission/risks; and measures to avoid infection.   DISPOSITION: # Faslodex today  # follow up 4 weeks- MD/ labs (CBC/CMP/Ca 27-29) Faslodex- Dr.B     No orders of the defined types were placed in this encounter.  All questions were answered. The patient knows to call the clinic with any problems, questions or concerns.      [name], MD 11/21/2018 12:23 PM

## 2018-11-22 ENCOUNTER — Telehealth: Payer: Self-pay | Admitting: *Deleted

## 2018-11-22 LAB — CANCER ANTIGEN 27.29: CA 27.29: 29.6 U/mL (ref 0.0–38.6)

## 2018-11-22 NOTE — Telephone Encounter (Signed)
Contacted pt at home. She is currently not at home per her husband and she Patient is at work. Husband asked me to contact pt back after 230 pm.

## 2018-11-22 NOTE — Telephone Encounter (Signed)
-----   Message from [name], MD sent at 11/22/2018  8:19 AM EDT ----- Please inform pt of her tumor markers- improving. Thx

## 2018-12-15 ENCOUNTER — Telehealth: Payer: Self-pay | Admitting: Pharmacist

## 2018-12-15 NOTE — Telephone Encounter (Signed)
Oral Chemotherapy Pharmacist Encounter  Follow-Up Form  Called patient today to follow up regarding patient's oral chemotherapy medication: Verzenio (abemaciclib)  Original Start date of oral chemotherapy: 02/2017  Pt reports 0 tablets/doses of Verzenio missed so far this month.    Pt reports the following side effects: None reported  Recent labs reviewed: CA 27.29 from 11/21/2018  New medications?: None reported  Other Issues: None reported  Patient knows to call the office with questions or concerns. Oral Oncology Clinic will continue to follow.  Darl Pikes, PharmD, BCPS, [facility] Hematology/Oncology Clinical Pharmacist [facility]/AP [facility] [place] [facility] [phone]  12/15/2018 1:48 PM

## 2018-12-18 ENCOUNTER — Other Ambulatory Visit: Payer: Self-pay

## 2018-12-18 DIAGNOSIS — C50511 Malignant neoplasm of lower-outer quadrant of right female breast: Secondary | ICD-10-CM

## 2018-12-18 DIAGNOSIS — Z170 Estrogen receptor positive status [ER+]: Principal | ICD-10-CM

## 2018-12-18 MED FILL — VERZENIO 100 MG TAB: 100 | 28 days supply | Qty: 56 | Fill #3

## 2018-12-19 ENCOUNTER — Encounter: Payer: Self-pay | Admitting: Internal Medicine

## 2018-12-19 ENCOUNTER — Inpatient Hospital Stay: Payer: BLUE CROSS/BLUE SHIELD | Attending: Internal Medicine

## 2018-12-19 ENCOUNTER — Inpatient Hospital Stay: Payer: BLUE CROSS/BLUE SHIELD

## 2018-12-19 ENCOUNTER — Other Ambulatory Visit: Payer: Self-pay

## 2018-12-19 ENCOUNTER — Inpatient Hospital Stay (HOSPITAL_BASED_OUTPATIENT_CLINIC_OR_DEPARTMENT_OTHER): Payer: BLUE CROSS/BLUE SHIELD | Admitting: Internal Medicine

## 2018-12-19 VITALS — BP 119/76 | HR 98 | Temp 97.3°F | Resp 20

## 2018-12-19 DIAGNOSIS — C50511 Malignant neoplasm of lower-outer quadrant of right female breast: Secondary | ICD-10-CM

## 2018-12-19 DIAGNOSIS — Z170 Estrogen receptor positive status [ER+]: Secondary | ICD-10-CM

## 2018-12-19 DIAGNOSIS — Z79811 Long term (current) use of aromatase inhibitors: Secondary | ICD-10-CM

## 2018-12-19 DIAGNOSIS — Z5111 Encounter for antineoplastic chemotherapy: Secondary | ICD-10-CM | POA: Diagnosis not present

## 2018-12-19 LAB — CBC WITH DIFFERENTIAL/PLATELET
Abs Immature Granulocytes: 0.01 10*3/uL (ref 0.00–0.07)
Basophils Absolute: 0.1 10*3/uL (ref 0.0–0.1)
Basophils Relative: 2 %
Eosinophils Absolute: 0.1 10*3/uL (ref 0.0–0.5)
Eosinophils Relative: 2 %
HCT: 35.7 % — ABNORMAL LOW (ref 36.0–46.0)
Hemoglobin: 12.1 g/dL (ref 12.0–15.0)
Immature Granulocytes: 0 %
Lymphocytes Relative: 43 %
Lymphs Abs: 1.6 10*3/uL (ref 0.7–4.0)
MCH: 32.4 pg (ref 26.0–34.0)
MCHC: 33.9 g/dL (ref 30.0–36.0)
MCV: 95.5 fL (ref 80.0–100.0)
Monocytes Absolute: 0.2 10*3/uL (ref 0.1–1.0)
Monocytes Relative: 6 %
Neutro Abs: 1.8 10*3/uL (ref 1.7–7.7)
Neutrophils Relative %: 47 %
Platelets: 240 10*3/uL (ref 150–400)
RBC: 3.74 MIL/uL — ABNORMAL LOW (ref 3.87–5.11)
RDW: 13.2 % (ref 11.5–15.5)
WBC: 3.8 10*3/uL — ABNORMAL LOW (ref 4.0–10.5)
nRBC: 0 % (ref 0.0–0.2)

## 2018-12-19 LAB — COMPREHENSIVE METABOLIC PANEL
ALT: 30 U/L (ref 0–44)
AST: 36 U/L (ref 15–41)
Albumin: 3.6 g/dL (ref 3.5–5.0)
Alkaline Phosphatase: 63 U/L (ref 38–126)
Anion gap: 11 (ref 5–15)
BUN: 20 mg/dL (ref 8–23)
CO2: 23 mmol/L (ref 22–32)
Calcium: 9.1 mg/dL (ref 8.9–10.3)
Chloride: 101 mmol/L (ref 98–111)
Creatinine, Ser: 1.24 mg/dL — ABNORMAL HIGH (ref 0.44–1.00)
GFR calc Af Amer: 53 mL/min — ABNORMAL LOW (ref >60)
GFR calc non Af Amer: 46 mL/min — ABNORMAL LOW (ref >60)
Glucose, Bld: 353 mg/dL — ABNORMAL HIGH (ref 70–99)
Potassium: 4.2 mmol/L (ref 3.5–5.1)
Sodium: 135 mmol/L (ref 135–145)
Total Bilirubin: 0.4 mg/dL (ref 0.3–1.2)
Total Protein: 7.2 g/dL (ref 6.5–8.1)

## 2018-12-19 MED ORDER — FULVESTRANT 250 MG/5ML IM SOLN
500.0000 mg | Freq: Once | INTRAMUSCULAR | Status: AC
  Administered 2018-12-19: 13:00:00 500 mg via INTRAMUSCULAR

## 2018-12-19 NOTE — Progress Notes (Signed)
I connected with Kimberly [name] on 04/21/2020at  2:30 PM EDT by video enabled telemedicine visit and verified that I am speaking with the correct person using two identifiers.  I discussed the limitations, risks, security and privacy concerns of performing an evaluation and management service by telemedicine and the availability of in-person appointments. I also discussed with the patient that there may be a patient responsible charge related to this service. The patient expressed understanding and agreed to proceed.    Other persons participating in the visit and their role in the encounter: RN/medical reconciliation Patient's location: Home Provider's location: Home  Oncology History Overview Note  # DEC 2012- RIGHT BREAST CA T2 N2 M0 tumor from biopsy.  Estrogen receptor positive, Progesterone receptor positive.  Current receptor negative by FISH 2. Neoadjuvant chemotherapy started in December of 28 with Cytoxan Adriamycin 3. Started on Taxol weekly chemotherapy. 4. Patient finished 12  cycles of Taxol chemotherapy in May of 2013.     5. Status post right modified radical mastectomy [Dr.Bowers; [place]] June of 2013, ypT1c  yp N2  MO. started also on Lerazole    7. Radiation therapy to the right breast (September of 2013).  Lymph node was positive for HER-2 receptor gene amplification of 2.22.  Will proceed with Herceptin treatment starting in September of 2013.   8.Patient has finished Herceptin (maintenance therapy) in August of 2014 8. Start patient on letrozole from November, 2013. 9. Patient started on Herceptin in September 2013.   10Patient finished 12 months of Herceptin therapy on August, 2014  # 6. Status post left side prophylactic mastectomy.  #Late MAY 2018-RECURRENCE BREAST CA- ER positive/PR negative; ?? HER-2/neu- [biopsy- proven-mediastinal lymph node; in suff for her 2 testing].  [elevated Tumor marker- CT/PET- uptake in Right Mediastinal LN; Sternum [June 2018 EBUS-  Dr.Kasa]   # March 10 2017- faslodex + Abema; OCT 5th CT-PR of mediastinal LN --------------------------------------------    DIAGNOSIS: [ BREAST CANCER- ER/PR/HER2 NEU POS  STAGE:  4   ;GOALS: PALLIATIVE  CURRENT/MOST RECENT THERAPY - ABEMA + FASLODEX    Carcinoma of lower-outer quadrant of right breast in female, estrogen receptor positive ([place])     Chief Complaint: breast cancer/metastatic    History of present illness:Kimberly [name] 63 y.o.  female with history of metastatic ER PR positive HER-2/neu positive breast cancer currently on Faslodex plus abemaciclib is here for follow-up.  Patient denies any unusual lumps or bumps.  Appetite is good but no weight loss.  Nausea no vomiting.  No chest pain.  Cough.  Observation/objective:  Assessment and plan: Carcinoma of lower-outer quadrant of right breast in female, estrogen receptor positive ([place])  #Recurrent breast cancer-ER positive PR negative ? HER-2/neu-currently on Faslodex plus abema.FEB 2020- CT A/P-NEG--stable  #Continue Faslodex plus Abema 100 BID; Labs today reviewed;  acceptable for treatment today.   #History of right upper extremity DVT:on Eliquis-stable.   # CKD-III; creatinine 1.2. Stable.   # rash in perineal region-s/p lamisil-improved.    DISPOSITION: # Faslodex today  # follow up 5 weeks- MD/ labs (CBC/CMP/Ca 27-29) Faslodex- Dr.B    Follow-up instructions:  I discussed the assessment and treatment plan with the patient.  The patient was provided an opportunity to ask questions and all were answered.  The patient agreed with the plan and demonstrated understanding of instructions.  The patient was advised to call back or seek an in person evaluation if the symptoms worsen or if the condition fails to improve as anticipated.  Dr. Charlaine Dalton [place] at [facility] 02/26/2019 4:33 PM

## 2018-12-19 NOTE — Assessment & Plan Note (Signed)
#  Recurrent breast cancer-ER positive PR negative ? HER-2/neu-currently on Faslodex plus abema.FEB 2020- CT A/P-NEG--stable  #Continue Faslodex plus Abema 100 BID; Labs today reviewed;  acceptable for treatment today.   #History of right upper extremity DVT:on Eliquis-stable.   # CKD-III; creatinine 1.2. Stable.   # rash in perineal region-s/p lamisil-improved.    DISPOSITION: # Faslodex today  # follow up 5 weeks- MD/ labs (CBC/CMP/Ca 27-29) Faslodex- Dr.B

## 2018-12-20 LAB — CANCER ANTIGEN 27.29: CA 27.29: 30.2 U/mL (ref 0.0–38.6)

## 2018-12-21 ENCOUNTER — Other Ambulatory Visit: Payer: Self-pay

## 2018-12-21 DIAGNOSIS — Z170 Estrogen receptor positive status [ER+]: Principal | ICD-10-CM

## 2018-12-21 DIAGNOSIS — C50511 Malignant neoplasm of lower-outer quadrant of right female breast: Secondary | ICD-10-CM

## 2019-01-16 MED FILL — VERZENIO 100 MG TAB: 100 | 28 days supply | Qty: 56 | Fill #4

## 2019-01-23 ENCOUNTER — Inpatient Hospital Stay: Payer: BLUE CROSS/BLUE SHIELD | Attending: Internal Medicine

## 2019-01-23 ENCOUNTER — Inpatient Hospital Stay (HOSPITAL_BASED_OUTPATIENT_CLINIC_OR_DEPARTMENT_OTHER): Payer: BLUE CROSS/BLUE SHIELD | Admitting: Internal Medicine

## 2019-01-23 ENCOUNTER — Inpatient Hospital Stay: Payer: BLUE CROSS/BLUE SHIELD

## 2019-01-23 ENCOUNTER — Encounter: Payer: Self-pay | Admitting: Internal Medicine

## 2019-01-23 ENCOUNTER — Other Ambulatory Visit: Payer: Self-pay

## 2019-01-23 DIAGNOSIS — Z170 Estrogen receptor positive status [ER+]: Secondary | ICD-10-CM

## 2019-01-23 DIAGNOSIS — N183 Chronic kidney disease, stage 3 (moderate): Secondary | ICD-10-CM

## 2019-01-23 DIAGNOSIS — Z79811 Long term (current) use of aromatase inhibitors: Secondary | ICD-10-CM

## 2019-01-23 DIAGNOSIS — C50511 Malignant neoplasm of lower-outer quadrant of right female breast: Secondary | ICD-10-CM

## 2019-01-23 DIAGNOSIS — Z86718 Personal history of other venous thrombosis and embolism: Secondary | ICD-10-CM

## 2019-01-23 DIAGNOSIS — Z7901 Long term (current) use of anticoagulants: Secondary | ICD-10-CM

## 2019-01-23 DIAGNOSIS — Z5111 Encounter for antineoplastic chemotherapy: Secondary | ICD-10-CM | POA: Insufficient documentation

## 2019-01-23 LAB — COMPREHENSIVE METABOLIC PANEL
ALT: 22 U/L (ref 0–44)
AST: 39 U/L (ref 15–41)
Albumin: 3.8 g/dL (ref 3.5–5.0)
Alkaline Phosphatase: 61 U/L (ref 38–126)
Anion gap: 13 (ref 5–15)
BUN: 16 mg/dL (ref 8–23)
CO2: 22 mmol/L (ref 22–32)
Calcium: 8.8 mg/dL — ABNORMAL LOW (ref 8.9–10.3)
Chloride: 96 mmol/L — ABNORMAL LOW (ref 98–111)
Creatinine, Ser: 1.19 mg/dL — ABNORMAL HIGH (ref 0.44–1.00)
GFR calc Af Amer: 56 mL/min — ABNORMAL LOW (ref >60)
GFR calc non Af Amer: 48 mL/min — ABNORMAL LOW (ref >60)
Glucose, Bld: 396 mg/dL — ABNORMAL HIGH (ref 70–99)
Potassium: 4.6 mmol/L (ref 3.5–5.1)
Sodium: 131 mmol/L — ABNORMAL LOW (ref 135–145)
Total Bilirubin: 0.6 mg/dL (ref 0.3–1.2)
Total Protein: 7.2 g/dL (ref 6.5–8.1)

## 2019-01-23 LAB — CBC WITH DIFFERENTIAL/PLATELET
Abs Immature Granulocytes: 0.01 10*3/uL (ref 0.00–0.07)
Basophils Absolute: 0.1 10*3/uL (ref 0.0–0.1)
Basophils Relative: 2 %
Eosinophils Absolute: 0 10*3/uL (ref 0.0–0.5)
Eosinophils Relative: 1 %
HCT: 37.4 % (ref 36.0–46.0)
Hemoglobin: 12.8 g/dL (ref 12.0–15.0)
Immature Granulocytes: 0 %
Lymphocytes Relative: 43 %
Lymphs Abs: 1.7 10*3/uL (ref 0.7–4.0)
MCH: 32.7 pg (ref 26.0–34.0)
MCHC: 34.2 g/dL (ref 30.0–36.0)
MCV: 95.7 fL (ref 80.0–100.0)
Monocytes Absolute: 0.2 10*3/uL (ref 0.1–1.0)
Monocytes Relative: 6 %
Neutro Abs: 1.9 10*3/uL (ref 1.7–7.7)
Neutrophils Relative %: 48 %
Platelets: 296 10*3/uL (ref 150–400)
RBC: 3.91 MIL/uL (ref 3.87–5.11)
RDW: 13 % (ref 11.5–15.5)
WBC: 3.9 10*3/uL — ABNORMAL LOW (ref 4.0–10.5)
nRBC: 0 % (ref 0.0–0.2)

## 2019-01-23 MED ORDER — FULVESTRANT 250 MG/5ML IM SOLN
500.0000 mg | Freq: Once | INTRAMUSCULAR | Status: AC
  Administered 2019-01-23: 500 mg via INTRAMUSCULAR
  Filled 2019-01-23: qty 10

## 2019-01-23 NOTE — Progress Notes (Signed)
I connected with Kimberly [name] on 01/23/19 at 11:00 AM EDT by video enabled telemedicine visit and verified that I am speaking with the correct person using two identifiers.  I discussed the limitations, risks, security and privacy concerns of performing an evaluation and management service by telemedicine and the availability of in-person appointments. I also discussed with the patient that there may be a patient responsible charge related to this service. The patient expressed understanding and agreed to proceed.    Other persons participating in the visit and their role in the encounter: None Patient's location: Home Provider's location: Home  Oncology History   # DEC 2012- RIGHT BREAST CA T2 N2 M0 tumor from biopsy.  Estrogen receptor positive, Progesterone receptor positive.  Current receptor negative by FISH 2. Neoadjuvant chemotherapy started in December of 28 with Cytoxan Adriamycin 3. Started on Taxol weekly chemotherapy. 4. Patient finished 12  cycles of Taxol chemotherapy in May of 2013.     5. Status post right modified radical mastectomy [Dr.Bowers; [place]] June of 2013, ypT1c  yp N2  MO. started also on Lerazole    7. Radiation therapy to the right breast (September of 2013).  Lymph node was positive for HER-2 receptor gene amplification of 2.22.  Will proceed with Herceptin treatment starting in September of 2013.   8.Patient has finished Herceptin (maintenance therapy) in August of 2014 8. Start patient on letrozole from November, 2013. 9. Patient started on Herceptin in September 2013.   10Patient finished 12 months of Herceptin therapy on August, 2014  # 6. Status post left side prophylactic mastectomy.  #Late MAY 2018-RECURRENCE BREAST CA- ER positive/PR negative; ?? HER-2/neu- [biopsy- proven-mediastinal lymph node; in suff for her 2 testing].  [elevated Tumor marker- CT/PET- uptake in Right Mediastinal LN; Sternum [June 2018 EBUS- Dr.Kasa]   # March 10 2017-  faslodex + Abema; OCT 5th CT-PR of mediastinal LN --------------------------------------------    DIAGNOSIS: [ BREAST CANCER- ER/PR/HER2 NEU POS  STAGE:  4   ;GOALS: PALLIATIVE  CURRENT/MOST RECENT THERAPY - ABEMA + FASLODEX      Carcinoma of lower-outer quadrant of right breast in female, estrogen receptor positive ([place])    Chief Complaint: Breast cancer   History of present illness:Kimberly [name] 63 y.o.  female with history of oligometastatic breast cancer ER PR positive HER-2/neu positive currently on Faslodex plus abemaciclib is here for follow-up.  Patient denies any unusual headaches.  Denies any new shortness of breath cough.  No nausea no vomiting.  Mild diarrhea.  Chronic.  Not any worse.  No significant tingling numbness.  Complains of continued swelling in the right arm/history of DVT on Eliquis.  Slightly worse in the morning overall not significantly worse.   Observation/objective: Labs pending today.  Assessment and plan: Carcinoma of lower-outer quadrant of right breast in female, estrogen receptor positive ([place])  #Recurrent breast cancer-ER positive PR negative ? HER-2/neu-currently on Faslodex plus abema.FEB 21st 2020- CT A/P-NEG-stable.  #Continue Faslodex plus Abema 100 BID; Labs today -pending.  We will plan to get CT scan prior to next visit/approximately 1 month.  #History of right upper extremity DVT:on Eliquis- stable.   # CKD-III; creatinine 1.2. stable.    DISPOSITION: # Faslodex/labs today  # Follow up on June 23rd [facility] [patient vacation]- MD/ labs (CBC/CMP/Ca 27-29) Faslodex- CT chest/a/p-prior- Dr.B     Follow-up instructions:  I discussed the assessment and treatment plan with the patient.  The patient was provided an opportunity to ask questions and all  were answered.  The patient agreed with the plan and demonstrated understanding of instructions.  The patient was advised to call back or seek an in person evaluation if the  symptoms worsen or if the condition fails to improve as anticipated.   Dr. Charlaine Dalton [place] at [facility] 01/23/2019 9:47 AM

## 2019-01-23 NOTE — Assessment & Plan Note (Addendum)
#  Recurrent breast cancer-ER positive PR negative ? HER-2/neu-currently on Faslodex plus abema.FEB 21st 2020- CT A/P-NEG-stable.  #Continue Faslodex plus Abema 100 BID; Labs today -pending.  We will plan to get CT scan prior to next visit/approximately 1 month.  #History of right upper extremity DVT:on Eliquis- stable.   # CKD-III; creatinine 1.2. stable.    DISPOSITION: # Faslodex/labs today  # Follow up on June 23rd [facility] [patient vacation]- MD/ labs (CBC/CMP/Ca 27-29) Faslodex- CT chest/a/p-prior- Dr.B

## 2019-01-25 ENCOUNTER — Encounter: Payer: Self-pay | Admitting: *Deleted

## 2019-01-25 LAB — CANCER ANTIGEN 27.29: CA 27.29: 44.6 U/mL — ABNORMAL HIGH (ref 0.0–38.6)

## 2019-02-06 ENCOUNTER — Other Ambulatory Visit: Payer: Self-pay | Admitting: Nurse Practitioner

## 2019-02-06 DIAGNOSIS — C50511 Malignant neoplasm of lower-outer quadrant of right female breast: Secondary | ICD-10-CM

## 2019-02-06 DIAGNOSIS — Z170 Estrogen receptor positive status [ER+]: Secondary | ICD-10-CM

## 2019-02-12 MED FILL — VERZENIO 100 MG TAB: 100 | 28 days supply | Qty: 56 | Fill #0

## 2019-02-13 ENCOUNTER — Other Ambulatory Visit: Payer: Self-pay

## 2019-02-13 ENCOUNTER — Ambulatory Visit
Admission: RE | Admit: 2019-02-13 | Discharge: 2019-02-13 | Disposition: A | Discharge status: Home / Self Care | Payer: BC Managed Care – PPO | Source: Ambulatory Visit | Attending: Internal Medicine | Admitting: Internal Medicine

## 2019-02-13 DIAGNOSIS — C50511 Malignant neoplasm of lower-outer quadrant of right female breast: Secondary | ICD-10-CM | POA: Diagnosis not present

## 2019-02-13 DIAGNOSIS — Z170 Estrogen receptor positive status [ER+]: Secondary | ICD-10-CM | POA: Insufficient documentation

## 2019-02-13 MED ORDER — IOHEXOL 300 MG/ML  SOLN
100.0000 mL | Freq: Once | INTRAMUSCULAR | Status: AC | PRN
  Administered 2019-02-13: 100 mL via INTRAVENOUS

## 2019-02-15 ENCOUNTER — Encounter: Payer: Self-pay | Admitting: Internal Medicine

## 2019-02-15 NOTE — Telephone Encounter (Signed)
Spoke to patient reviewed imaging.  Patient agrees to follow-up as planned on next Friday-26 June.

## 2019-02-18 ENCOUNTER — Ambulatory Visit
Admission: EM | Admit: 2019-02-18 | Discharge: 2019-02-18 | Disposition: A | Discharge status: Home / Self Care | Payer: BC Managed Care – PPO | Attending: Emergency Medicine | Admitting: Emergency Medicine

## 2019-02-18 ENCOUNTER — Other Ambulatory Visit: Payer: Self-pay

## 2019-02-18 DIAGNOSIS — R220 Localized swelling, mass and lump, head: Secondary | ICD-10-CM

## 2019-02-18 DIAGNOSIS — T7840XA Allergy, unspecified, initial encounter: Secondary | ICD-10-CM | POA: Diagnosis not present

## 2019-02-18 MED ORDER — LORATADINE 10 MG PO TABS: 10.0000 mg | ORAL_TABLET | Freq: Every day | ORAL | 0 refills | Status: DC

## 2019-02-18 MED ORDER — PREDNISONE 10 MG (21) PO TBPK: ORAL_TABLET | ORAL | 0 refills | Status: DC

## 2019-02-18 MED ORDER — FAMOTIDINE 20 MG PO TABS: 20.0000 mg | ORAL_TABLET | Freq: Two times a day (BID) | ORAL | 0 refills | Status: DC

## 2019-02-18 MED ORDER — FAMOTIDINE 40 MG PO TABS
40.0000 mg | ORAL_TABLET | Freq: Once | ORAL | Status: AC
  Administered 2019-02-18: 40 mg via ORAL

## 2019-02-18 MED ORDER — METHYLPREDNISOLONE SODIUM SUCC 125 MG IJ SOLR
125.0000 mg | Freq: Once | INTRAMUSCULAR | Status: AC
  Administered 2019-02-18: 125 mg via INTRAMUSCULAR

## 2019-02-18 MED ORDER — MUPIROCIN 2 % EX OINT: 1.0000 "application " | TOPICAL_OINTMENT | Freq: Three times a day (TID) | CUTANEOUS | 0 refills | Status: DC

## 2019-02-18 NOTE — ED Provider Notes (Addendum)
HPI  SUBJECTIVE:  Kimberly [name] is a 63 y.o. female who presents with itchy facial swelling starting last night.  States that she was mowing, and got two "itchy marks" on her forehead 2 days ago.  She is not sure if it was a bug bite, exposure to poison ivy/poison oak.  She states that the lesions are weeping.  She denies pain with the facial edema.  Edema located over the forehead, periorbital region bilaterally.  No visual changes, eye pain, photophobia.  No tongue or lip swelling, difficulty breathing.  No wheezing, shortness of breath.  No abdominal pain, diarrhea, syncope.  No swelling or rash elsewhere.  No new lotions, soaps, detergents, foods, change in her medications.  She tried Benadryl 25 mg and liquid Benadryl last night with some improvement in the itching.  She has been putting cortisone cream on the scabs on her forehead.  No aggravating factors.  She has a past medical history of breast cancer on chemo, diabetes, hypertension.  AJG:[id number], Louellen Molder, MD    Past Medical History:  Diagnosis Date   Arthritis    Cancer of lower-outer quadrant of female breast ([place]) 08/06/2011   RIGHT    Diabetes mellitus ([place]) 03/24/2012   High cholesterol    History of kidney stones    Hx of bilateral breast implants    Hypertension    PONV (postoperative nausea and vomiting)    Type II diabetes mellitus ([place])    fasting 140-150   Vertigo    LAST WEEK   Vertigo 01/2017    Past Surgical History:  Procedure Laterality Date   BREAST BIOPSY  07/2011   right   BREAST RECONSTRUCTION  03/07/2012   Procedure: BREAST RECONSTRUCTION;  Surgeon: Crissie Reese, MD;  Location: [facility];  Service: Plastics;  Laterality: Left;  BREAST RECONSTRUCTION WITH PLACEMENT OF TISSUE EXPANDER TO LEFT BREAST   BREAST RECONSTRUCTION  02/06/2013   CESAREAN SECTION  [id number],[id number]   ENDOBRONCHIAL ULTRASOUND N/A 02/24/2017   Procedure: ENDOBRONCHIAL ULTRASOUND;  Surgeon: [name], MD;  Location: [facility]  ORS;  Service: Cardiopulmonary;  Laterality: N/A;   LATISSIMUS FLAP TO BREAST Right 02/06/2013   Procedure: LATISSIMUS FLAP TO RIGHT BREAST WITH IMPLANT;  Surgeon: Crissie Reese, MD;  Location: [facility];  Service: Plastics;  Laterality: Right;   MASTECTOMY  03/07/12   modified right; total left   MODIFIED MASTECTOMY  03/07/2012   Procedure: MODIFIED MASTECTOMY;  Surgeon: Haywood Lasso, MD;  Location: [place] [place];  Service: General;  Laterality: Right;   PORTACATH PLACEMENT  07/2011   SCAR REVISION  03/30/2012   Procedure: SCAR REVISION;  Surgeon: Haywood Lasso, MD;  Location: [facility];  Service: General;  Laterality: Right;  CLOSURE OF MASTECTOMY INCISION   WISDOM TOOTH EXTRACTION      Family History  Problem Relation Age of Onset   Cancer Mother        breast    Social History   Tobacco Use   Smoking status: Never Smoker   Smokeless tobacco: Never Used  Substance Use Topics   Alcohol use: No   Drug use: No    No current facility-administered medications for this encounter.   Current Outpatient Medications:    atorvastatin (LIPITOR) 20 MG tablet, Take 20 mg by mouth every morning., Disp: , Rfl:    Calcium Carbonate-Vitamin D (CALCIUM 600 + D PO), Take 1 tablet by mouth 2 (two) times daily., Disp: , Rfl:    ELIQUIS 5 MG TABS  tablet, TAKE 1 TABLET BY MOUTH TWICE A DAY, Disp: 60 tablet, Rfl: 3   fulvestrant (FASLODEX) 250 MG/5ML injection, Inject into the muscle every 30 (thirty) days. One injection each buttock over 1-2 minutes. Warm prior to use., Disp: , Rfl:    glipiZIDE (GLUCOTROL XL) 10 MG 24 hr tablet, Take 10 mg by mouth 2 (two) times daily., Disp: , Rfl: 11   levothyroxine (SYNTHROID, LEVOTHROID) 25 MCG tablet, Take 1 tablet (25 mcg total) by mouth daily before breakfast., Disp: 90 tablet, Rfl: 1   lisinopril (PRINIVIL,ZESTRIL) 10 MG tablet, Take 10 mg by mouth every morning. , Disp: , Rfl:    loperamide (IMODIUM A-D) 2 MG tablet, Take 2 mg by  mouth 4 (four) times daily as needed for diarrhea or loose stools., Disp: , Rfl:    ondansetron (ZOFRAN) 8 MG tablet, Take 1 tablet (8 mg total) by mouth every 8 (eight) hours as needed for nausea or vomiting., Disp: 20 tablet, Rfl: 3   pantoprazole (PROTONIX) 40 MG tablet, Take 1 tablet (40 mg total) by mouth daily. 30 mins prior to meals; On empty stomach, Disp: 30 tablet, Rfl: 0   prochlorperazine (COMPAZINE) 10 MG tablet, Take 1 tablet (10 mg total) by mouth every 6 (six) hours as needed for nausea or vomiting., Disp: 30 tablet, Rfl: 0   Semaglutide (OZEMPIC) 0.25 or 0.5 MG/DOSE SOPN, Inject 0.25 mg into the skin once a week., Disp: , Rfl:    terbinafine (LAMISIL) 250 MG tablet, Take 1 tablet (250 mg total) by mouth daily., Disp: 14 tablet, Rfl: 0   VERZENIO 100 MG tablet, TAKE 1 TABLET (100 MG TOTAL) BY MOUTH 2 (TWO) TIMES DAILY., Disp: 56 tablet, Rfl: 4   famotidine (PEPCID) 20 MG tablet, Take 1 tablet (20 mg total) by mouth 2 (two) times daily., Disp: 10 tablet, Rfl: 0   loratadine (CLARITIN) 10 MG tablet, Take 1 tablet (10 mg total) by mouth daily., Disp: 10 tablet, Rfl: 0   mupirocin ointment (BACTROBAN) 2 %, Apply 1 application topically 3 (three) times daily., Disp: 22 g, Rfl: 0   predniSONE (STERAPRED UNI-PAK 21 TAB) 10 MG (21) TBPK tablet, Dispense one 6 day pack. Take as directed with food., Disp: 21 tablet, Rfl: 0  Allergies  Allergen Reactions   Sulfa Antibiotics Rash     ROS  As noted in HPI.   Physical Exam  BP (!) 174/103 (BP Location: Left Arm)    Pulse (!) 106    Temp 98.1 F (36.7 C) (Oral)    Resp 18    Ht 5\' 2"  (1.575 m)    Wt 86.2 kg    SpO2 98%    BMI 34.75 kg/m   Constitutional: Well developed, well nourished, no acute distress Eyes:  EOMI, conjunctiva normal bilaterally.  No direct or consensual photophobia.  PERRLA.  Bilateral extensive periorbital edema, no erythema, tenderness. HENT: Normocephalic, atraumatic,mucus membranes moist positive upper  facial swelling.  Positive erythematous scabbed lesions with some yellowish crusting on her forehead.  Tongue normal, airway widely patent, see picture   Respiratory: Normal inspiratory effort, good air movement, lungs clear bilaterally Cardiovascular: mild Regular tachycardia GI: nondistended skin: No rash, skin intact Musculoskeletal: no deformities Neurologic: Alert & oriented x 3, no focal neuro deficits Psychiatric: Speech and behavior appropriate   ED Course   Medications  methylPREDNISolone sodium succinate (SOLU-MEDROL) 125 mg/2 mL injection 125 mg (125 mg Intramuscular Given 02/18/19 1034)  famotidine (PEPCID) tablet 40 mg (40 mg Oral Given  02/18/19 1033)    No orders of the defined types were placed in this encounter.   No results found for this or any previous visit (from the past 24 hour(s)). No results found.  ED Clinical Impression  1. Allergic reaction, initial encounter   2. Facial swelling      ED Assessment/Plan  Patient with an allergic reaction.  No evidence of anaphylaxis, periorbital cellulitis.  Giving Solu-Medrol 125 mg here.  Also Pepcid 40 mg.  Patient drove here, she states that she will take an H1 blocker such as 50 mg of Benadryl, Claritin or Zyrtec when she gets home.  Cool compresses.  Bactroban for the lesions on her forehead.  Home with a prednisone taper, discussed with her that this will elevate her sugars.  She may start it tomorrow since she is getting Solu-Medrol 125 mg today.  She is significantly better by tomorrow, she does not have to take the prednisone taper.  Discussed this with patient.  Also Pepcid, Claritin or Zyrtec during the day, Benadryl at night, Bactroban for the lesions on her forehead.  Follow-up with PMD as needed, to the ER for any signs of anaphylaxis, or other concerns.  Blood pressure also noted.  Patient has no symptoms.  Denies headache, visual changes, strokelike symptoms, chest pain, shortness of breath, pain tearing  through to her back.  She did not take her blood pressure medications this morning.  Advised her to take it as soon as she got home today.  Discussed  MDM, treatment plan, and plan for follow-up with patient. Discussed sn/sx that should prompt return to the ED. patient agrees with plan.   Meds ordered this encounter  Medications   methylPREDNISolone sodium succinate (SOLU-MEDROL) 125 mg/2 mL injection 125 mg   famotidine (PEPCID) tablet 40 mg   famotidine (PEPCID) 20 MG tablet    Sig: Take 1 tablet (20 mg total) by mouth 2 (two) times daily.    Dispense:  10 tablet    Refill:  0   loratadine (CLARITIN) 10 MG tablet    Sig: Take 1 tablet (10 mg total) by mouth daily.    Dispense:  10 tablet    Refill:  0   predniSONE (STERAPRED UNI-PAK 21 TAB) 10 MG (21) TBPK tablet    Sig: Dispense one 6 day pack. Take as directed with food.    Dispense:  21 tablet    Refill:  0   mupirocin ointment (BACTROBAN) 2 %    Sig: Apply 1 application topically 3 (three) times daily.    Dispense:  22 g    Refill:  0    *This clinic note was created using [employer]. Therefore, there may be occasional mistakes despite careful proofreading.   ?    Melynda Ripple, MD 02/18/19 1041    Melynda Ripple, MD 02/18/19 1041

## 2019-02-18 NOTE — Discharge Instructions (Addendum)
Make sure you take your blood pressure medicine as soon as you get home.  Also take either the Claritin or 50 mg of Benadryl when you get home.  Cool compresses.  Bactroban for the lesions on your forehead.  You can start the prednisone taper tomorrow as you got a extraction of steroids today.  Do not start it if you are significantly better tomorrow.  However I would continue taking Claritin for the next 5 days, and 25 to 50 mg of Benadryl at night.  Go immediately to the ER for trouble breathing, visual loss, visual changes, if you get worse, or for other concerns

## 2019-02-18 NOTE — ED Triage Notes (Signed)
Pt was mowing on Friday and had 2 spots on her forehead but thought she was fine. Then yesterday noticed her face was swelling. Left eye is swollen almost shut. Having itching in her face and drainage from the spots on her forehead. Did take benadryl without relief.

## 2019-02-19 ENCOUNTER — Inpatient Hospital Stay: Payer: BC Managed Care – PPO

## 2019-02-19 ENCOUNTER — Ambulatory Visit: Payer: BLUE CROSS/BLUE SHIELD | Admitting: Internal Medicine

## 2019-02-20 ENCOUNTER — Other Ambulatory Visit: Payer: BLUE CROSS/BLUE SHIELD

## 2019-02-20 ENCOUNTER — Inpatient Hospital Stay: Payer: BC Managed Care – PPO | Admitting: Internal Medicine

## 2019-02-20 ENCOUNTER — Ambulatory Visit: Payer: BLUE CROSS/BLUE SHIELD | Admitting: Internal Medicine

## 2019-02-20 ENCOUNTER — Ambulatory Visit: Payer: BLUE CROSS/BLUE SHIELD

## 2019-02-22 ENCOUNTER — Other Ambulatory Visit: Payer: Self-pay

## 2019-02-23 ENCOUNTER — Other Ambulatory Visit: Payer: Self-pay

## 2019-02-23 ENCOUNTER — Inpatient Hospital Stay: Payer: BC Managed Care – PPO

## 2019-02-23 ENCOUNTER — Inpatient Hospital Stay: Payer: BC Managed Care – PPO | Attending: Internal Medicine

## 2019-02-23 ENCOUNTER — Inpatient Hospital Stay (HOSPITAL_BASED_OUTPATIENT_CLINIC_OR_DEPARTMENT_OTHER): Payer: BC Managed Care – PPO | Admitting: Internal Medicine

## 2019-02-23 VITALS — BP 155/101 | HR 94 | Temp 97.3°F | Resp 20 | Ht 62.0 in | Wt 179.0 lb

## 2019-02-23 DIAGNOSIS — E7800 Pure hypercholesterolemia, unspecified: Secondary | ICD-10-CM

## 2019-02-23 DIAGNOSIS — I129 Hypertensive chronic kidney disease with stage 1 through stage 4 chronic kidney disease, or unspecified chronic kidney disease: Secondary | ICD-10-CM | POA: Insufficient documentation

## 2019-02-23 DIAGNOSIS — Z9013 Acquired absence of bilateral breasts and nipples: Secondary | ICD-10-CM | POA: Diagnosis not present

## 2019-02-23 DIAGNOSIS — L237 Allergic contact dermatitis due to plants, except food: Secondary | ICD-10-CM

## 2019-02-23 DIAGNOSIS — Z7901 Long term (current) use of anticoagulants: Secondary | ICD-10-CM

## 2019-02-23 DIAGNOSIS — R42 Dizziness and giddiness: Secondary | ICD-10-CM | POA: Insufficient documentation

## 2019-02-23 DIAGNOSIS — Z79818 Long term (current) use of other agents affecting estrogen receptors and estrogen levels: Secondary | ICD-10-CM

## 2019-02-23 DIAGNOSIS — Z87442 Personal history of urinary calculi: Secondary | ICD-10-CM | POA: Diagnosis not present

## 2019-02-23 DIAGNOSIS — Z86718 Personal history of other venous thrombosis and embolism: Secondary | ICD-10-CM | POA: Insufficient documentation

## 2019-02-23 DIAGNOSIS — C50511 Malignant neoplasm of lower-outer quadrant of right female breast: Secondary | ICD-10-CM | POA: Insufficient documentation

## 2019-02-23 DIAGNOSIS — Z170 Estrogen receptor positive status [ER+]: Secondary | ICD-10-CM | POA: Diagnosis not present

## 2019-02-23 DIAGNOSIS — Z9221 Personal history of antineoplastic chemotherapy: Secondary | ICD-10-CM | POA: Insufficient documentation

## 2019-02-23 DIAGNOSIS — Z79899 Other long term (current) drug therapy: Secondary | ICD-10-CM | POA: Diagnosis not present

## 2019-02-23 DIAGNOSIS — M129 Arthropathy, unspecified: Secondary | ICD-10-CM | POA: Diagnosis not present

## 2019-02-23 DIAGNOSIS — N183 Chronic kidney disease, stage 3 (moderate): Secondary | ICD-10-CM

## 2019-02-23 DIAGNOSIS — E1165 Type 2 diabetes mellitus with hyperglycemia: Secondary | ICD-10-CM | POA: Insufficient documentation

## 2019-02-23 DIAGNOSIS — Z7984 Long term (current) use of oral hypoglycemic drugs: Secondary | ICD-10-CM | POA: Diagnosis not present

## 2019-02-23 DIAGNOSIS — R222 Localized swelling, mass and lump, trunk: Secondary | ICD-10-CM

## 2019-02-23 LAB — COMPREHENSIVE METABOLIC PANEL
ALT: 42 U/L (ref 0–44)
AST: 57 U/L — ABNORMAL HIGH (ref 15–41)
Albumin: 4.2 g/dL (ref 3.5–5.0)
Alkaline Phosphatase: 94 U/L (ref 38–126)
Anion gap: 10 (ref 5–15)
BUN: 29 mg/dL — ABNORMAL HIGH (ref 8–23)
CO2: 24 mmol/L (ref 22–32)
Calcium: 9.4 mg/dL (ref 8.9–10.3)
Chloride: 98 mmol/L (ref 98–111)
Creatinine, Ser: 1.18 mg/dL — ABNORMAL HIGH (ref 0.44–1.00)
GFR calc Af Amer: 56 mL/min — ABNORMAL LOW (ref >60)
GFR calc non Af Amer: 49 mL/min — ABNORMAL LOW (ref >60)
Glucose, Bld: 404 mg/dL — ABNORMAL HIGH (ref 70–99)
Potassium: 4.4 mmol/L (ref 3.5–5.1)
Sodium: 132 mmol/L — ABNORMAL LOW (ref 135–145)
Total Bilirubin: 0.6 mg/dL (ref 0.3–1.2)
Total Protein: 7.9 g/dL (ref 6.5–8.1)

## 2019-02-23 LAB — CBC WITH DIFFERENTIAL/PLATELET
Abs Immature Granulocytes: 0.04 10*3/uL (ref 0.00–0.07)
Basophils Absolute: 0 10*3/uL (ref 0.0–0.1)
Basophils Relative: 0 %
Eosinophils Absolute: 0 10*3/uL (ref 0.0–0.5)
Eosinophils Relative: 0 %
HCT: 39.5 % (ref 36.0–46.0)
Hemoglobin: 13.8 g/dL (ref 12.0–15.0)
Immature Granulocytes: 1 %
Lymphocytes Relative: 31 %
Lymphs Abs: 1.2 10*3/uL (ref 0.7–4.0)
MCH: 32.5 pg (ref 26.0–34.0)
MCHC: 34.9 g/dL (ref 30.0–36.0)
MCV: 93.2 fL (ref 80.0–100.0)
Monocytes Absolute: 0.3 10*3/uL (ref 0.1–1.0)
Monocytes Relative: 8 %
Neutro Abs: 2.3 10*3/uL (ref 1.7–7.7)
Neutrophils Relative %: 60 %
Platelets: 293 10*3/uL (ref 150–400)
RBC: 4.24 MIL/uL (ref 3.87–5.11)
RDW: 12.5 % (ref 11.5–15.5)
WBC: 3.8 10*3/uL — ABNORMAL LOW (ref 4.0–10.5)
nRBC: 0 % (ref 0.0–0.2)

## 2019-02-23 MED ORDER — FULVESTRANT 250 MG/5ML IM SOLN
500.0000 mg | Freq: Once | INTRAMUSCULAR | Status: AC
  Administered 2019-02-23: 500 mg via INTRAMUSCULAR
  Filled 2019-02-23: qty 10

## 2019-02-23 MED ORDER — APIXABAN 5 MG PO TABS: 5.0000 mg | ORAL_TABLET | Freq: Two times a day (BID) | ORAL | 6 refills | Status: DC

## 2019-02-23 NOTE — Assessment & Plan Note (Addendum)
#  Recurrent breast cancer-ER positive PR negative ? HER-2/neu-currently on Faslodex plus abema June 2020 CT A/P-NEG; except for 1.2 cm soft tissue nodule noted in the right anterior chest wall.  However noted this on a CT scan from October 2019.  Will get an ultrasound.  Clinically stable.  #Continue Faslodex plus Abema 100 BID; Labs today-white count normal.  May 2020-2 marker 46 slightly elevated.   #History of right upper extremity DVT:on Eliquis-- Stable.   # CKD-III; creatinine 1.2.  Stable  # Elevated Bloog suagrs 404-poorly controlled secondary to steroids.  Recommend close monitoring.  Recommend follow-up with PCP.  # Elevated Blood pressure-poorly controlled from steroids 150s/100.  Monitor closely.  Recommend follow-up with PCP  # Exposure to poison ivy- on steroids taper; last dose of steroids today.  Improved.   DISPOSITION: # Faslodex/labs today  # [place] chest wall in 1 week.  # Follow up in 1 month MD/ labs (CBC/CMP/Ca 27-29) Faslodex- Dr.B

## 2019-02-23 NOTE — Progress Notes (Signed)
.[place] OFFICE PROGRESS NOTE  Patient Care Team: Karen Kitchens, MD as PCP - General (Family Medicine) Neldon Mc, MD (General Surgery) Everlene Farrier, MD (Obstetrics and Gynecology) Noreene Filbert, MD Forest Gleason, MD (Inactive) (Unknown Physician Specialty)  Cancer Staging Carcinoma of lower-outer quadrant of right breast in female, estrogen receptor positive [facility]) Staging form: Breast, AJCC 7th Edition - Pathologic: ypT1c,ypN2a, MX - Signed by Haywood Lasso, MD on 03/10/2012    Oncology History Overview Note  # DEC 2012- RIGHT BREAST CA T2 N2 M0 tumor from biopsy.  Estrogen receptor positive, Progesterone receptor positive.  Current receptor negative by FISH 2. Neoadjuvant chemotherapy started in December of 28 with Cytoxan Adriamycin 3. Started on Taxol weekly chemotherapy. 4. Patient finished 12  cycles of Taxol chemotherapy in May of 2013.     5. Status post right modified radical mastectomy [Dr.Bowers; [place]] June of 2013, ypT1c  yp N2  MO. started also on Lerazole    7. Radiation therapy to the right breast (September of 2013).  Lymph node was positive for HER-2 receptor gene amplification of 2.22.  Will proceed with Herceptin treatment starting in September of 2013.   8.Patient has finished Herceptin (maintenance therapy) in August of 2014 8. Start patient on letrozole from November, 2013. 9. Patient started on Herceptin in September 2013.   10Patient finished 12 months of Herceptin therapy on August, 2014  # 6. Status post left side prophylactic mastectomy.  #Late MAY 2018-RECURRENCE BREAST CA- ER positive/PR negative; ?? HER-2/neu- [biopsy- proven-mediastinal lymph node; in suff for her 2 testing].  [elevated Tumor marker- CT/PET- uptake in Right Mediastinal LN; Sternum [June 2018 [facility]- Dr.Kasa]   # March 10 2017- faslodex + Abema; OCT 5th CT-PR of mediastinal LN --------------------------------------------    DIAGNOSIS: [ BREAST  CANCER- ER/PR/HER2 NEU POS  STAGE:  4   ;GOALS: PALLIATIVE  CURRENT/MOST RECENT THERAPY - ABEMA + FASLODEX    Carcinoma of lower-outer quadrant of right breast in female, estrogen receptor positive ([place])   INTERVAL HISTORY:  Kimberly [name] 63 y.o.  female pleasant patient above history of metastatic ER PR positive HER-2/neu positive breast cancer currently on abema+ Faslodex is here for follow-up/review the results of the CT scan.  Patient states that she was mowing her neighbor's yard last week; a day later noted to have significant swelling and rash of the face.  She was seen in the urgent care clinic where she received Benadryl/hydrocortisone injection.  She is currently on prednisone taper.  Review of Systems  Constitutional: Positive for malaise/fatigue. Negative for chills, diaphoresis, fever and weight loss.  HENT: Negative for nosebleeds and sore throat.   Eyes: Negative for double vision.  Respiratory: Negative for cough, hemoptysis, sputum production, shortness of breath and wheezing.   Cardiovascular: Negative for chest pain, palpitations, orthopnea and leg swelling.  Gastrointestinal: Positive for diarrhea. Negative for abdominal pain, blood in stool, constipation, heartburn, melena, nausea and vomiting.  Genitourinary: Negative for dysuria, frequency and urgency.  Musculoskeletal: Positive for back pain and joint pain.  Neurological: Negative for dizziness, tingling, focal weakness, weakness and headaches.  Endo/Heme/Allergies: Does not bruise/bleed easily.  Psychiatric/Behavioral: Negative for depression. The patient is not nervous/anxious and does not have insomnia.       PAST MEDICAL HISTORY :  Past Medical History:  Diagnosis Date  . Arthritis   . Cancer of lower-outer quadrant of female breast ([place]) 08/06/2011   RIGHT   . Diabetes mellitus ([place]) 03/24/2012  . High  cholesterol   . History of kidney stones   . Hx of bilateral breast implants   . Hypertension    . PONV (postoperative nausea and vomiting)   . Type II diabetes mellitus ([facility])    fasting 140-150  . Vertigo    LAST WEEK  . Vertigo 01/2017    PAST SURGICAL HISTORY :   Past Surgical History:  Procedure Laterality Date  . BREAST BIOPSY  07/2011   right  . BREAST RECONSTRUCTION  03/07/2012   Procedure: BREAST RECONSTRUCTION;  Surgeon: Crissie Reese, MD;  Location: [facility];  Service: Plastics;  Laterality: Left;  BREAST RECONSTRUCTION WITH PLACEMENT OF TISSUE EXPANDER TO LEFT BREAST  . BREAST RECONSTRUCTION  02/06/2013  . CESAREAN SECTION  [id number]  . ENDOBRONCHIAL ULTRASOUND N/A 02/24/2017   Procedure: ENDOBRONCHIAL ULTRASOUND;  Surgeon: [name], MD;  Location: [facility];  Service: Cardiopulmonary;  Laterality: N/A;  . LATISSIMUS FLAP TO BREAST Right 02/06/2013   Procedure: LATISSIMUS FLAP TO RIGHT BREAST WITH IMPLANT;  Surgeon: Crissie Reese, MD;  Location: [facility];  Service: Plastics;  Laterality: Right;  . MASTECTOMY  03/07/12   modified right; total left  . MODIFIED MASTECTOMY  03/07/2012   Procedure: MODIFIED MASTECTOMY;  Surgeon: Haywood Lasso, MD;  Location: [place];  Service: General;  Laterality: Right;  . PORTACATH PLACEMENT  07/2011  . SCAR REVISION  03/30/2012   Procedure: SCAR REVISION;  Surgeon: Haywood Lasso, MD;  Location: [facility];  Service: General;  Laterality: Right;  CLOSURE OF MASTECTOMY INCISION  . WISDOM TOOTH EXTRACTION      FAMILY HISTORY :   Family History  Problem Relation Age of Onset  . Cancer Mother        breast    SOCIAL HISTORY:   Social History   Tobacco Use  . Smoking status: Never Smoker  . Smokeless tobacco: Never Used  Substance Use Topics  . Alcohol use: No  . Drug use: No    ALLERGIES:  is allergic to sulfa antibiotics.  MEDICATIONS:  Current Outpatient Medications  Medication Sig Dispense Refill  . apixaban (ELIQUIS) 5 MG TABS tablet Take 1 tablet (5 mg total) by mouth 2 (two) times daily. 60 tablet 6  .  atorvastatin (LIPITOR) 20 MG tablet Take 20 mg by mouth every morning.    . Calcium Carbonate-Vitamin D (CALCIUM 600 + D PO) Take 1 tablet by mouth 2 (two) times daily.    . famotidine (PEPCID) 20 MG tablet Take 1 tablet (20 mg total) by mouth 2 (two) times daily. 10 tablet 0  . fulvestrant (FASLODEX) 250 MG/5ML injection Inject into the muscle every 30 (thirty) days. One injection each buttock over 1-2 minutes. Warm prior to use.    [place] Kitchen glipiZIDE (GLUCOTROL XL) 10 MG 24 hr tablet Take 10 mg by mouth 2 (two) times daily.  11  . levothyroxine (SYNTHROID, LEVOTHROID) 25 MCG tablet Take 1 tablet (25 mcg total) by mouth daily before breakfast. 90 tablet 1  . lisinopril (PRINIVIL,ZESTRIL) 10 MG tablet Take 10 mg by mouth every morning.     . loperamide (IMODIUM A-D) 2 MG tablet Take 2 mg by mouth 4 (four) times daily as needed for diarrhea or loose stools.    [place] Kitchen loratadine (CLARITIN) 10 MG tablet Take 1 tablet (10 mg total) by mouth daily. 10 tablet 0  . mupirocin ointment (BACTROBAN) 2 % Apply 1 application topically 3 (three) times daily. 22 g 0  . ondansetron (ZOFRAN) 8 MG  tablet Take 1 tablet (8 mg total) by mouth every 8 (eight) hours as needed for nausea or vomiting. 20 tablet 3  . pantoprazole (PROTONIX) 40 MG tablet Take 1 tablet (40 mg total) by mouth daily. 30 mins prior to meals; On empty stomach 30 tablet 0  . predniSONE (STERAPRED UNI-PAK 21 TAB) 10 MG (21) TBPK tablet Dispense one 6 day pack. Take as directed with food. 21 tablet 0  . prochlorperazine (COMPAZINE) 10 MG tablet Take 1 tablet (10 mg total) by mouth every 6 (six) hours as needed for nausea or vomiting. 30 tablet 0  . Semaglutide (OZEMPIC) 0.25 or 0.5 MG/DOSE SOPN Inject 0.25 mg into the skin once a week.    . terbinafine (LAMISIL) 250 MG tablet Take 1 tablet (250 mg total) by mouth daily. 14 tablet 0  . VERZENIO 100 MG tablet TAKE 1 TABLET (100 MG TOTAL) BY MOUTH 2 (TWO) TIMES DAILY. 56 tablet 4   No current facility-administered  medications for this visit.     PHYSICAL EXAMINATION: ECOG PERFORMANCE STATUS: 1 - Symptomatic but completely ambulatory  BP (!) 155/101   Pulse 94   Temp (!) 97.3 F (36.3 C) (Tympanic)   Resp 20   Ht '5\' 2"'  (1.575 m)   Wt 179 lb (81.2 kg)   BMI 32.74 kg/m   Filed Weights   02/23/19 0835  Weight: 179 lb (81.2 kg)    Physical Exam  Constitutional: She is oriented to person, place, and time and well-developed, well-nourished, and in no distress.  She is alone.   HENT:  Head: Normocephalic and atraumatic.  Mouth/Throat: Oropharynx is clear and moist. No oropharyngeal exudate.  Eyes: Pupils are equal, round, and reactive to light.  Neck: Normal range of motion. Neck supple.  Cardiovascular: Normal rate and regular rhythm.  Pulmonary/Chest: No respiratory distress. She has no wheezes.  Abdominal: Soft. Bowel sounds are normal. She exhibits no distension and no mass. There is no abdominal tenderness. There is no rebound and no guarding.  Musculoskeletal: Normal range of motion.        General: No tenderness or edema.     Comments: Approximately 1 cm hard nodule felt in the anterior chest wall/left parasternal.  Neurological: She is alert and oriented to person, place, and time.  Skin: Skin is warm.  Psychiatric: Affect normal.       LABORATORY DATA:  I have reviewed the data as listed    Component Value Date/Time   NA 132 (L) 02/23/2019 0759   NA 135 11/28/2014 1401   K 4.4 02/23/2019 0759   K 4.7 11/28/2014 1401   CL 98 02/23/2019 0759   CL 99 (L) 11/28/2014 1401   CO2 24 02/23/2019 0759   CO2 28 11/28/2014 1401   GLUCOSE 404 (H) 02/23/2019 0759   GLUCOSE 215 (H) 11/28/2014 1401   BUN 29 (H) 02/23/2019 0759   BUN 16 11/28/2014 1401   CREATININE 1.18 (H) 02/23/2019 0759   CREATININE 0.96 11/28/2014 1401   CALCIUM 9.4 02/23/2019 0759   CALCIUM 9.9 11/28/2014 1401   PROT 7.9 02/23/2019 0759   PROT 7.6 11/28/2014 1401   ALBUMIN 4.2 02/23/2019 0759   ALBUMIN  4.5 11/28/2014 1401   AST 57 (H) 02/23/2019 0759   AST 26 11/28/2014 1401   ALT 42 02/23/2019 0759   ALT 28 11/28/2014 1401   ALKPHOS 94 02/23/2019 0759   ALKPHOS 78 11/28/2014 1401   BILITOT 0.6 02/23/2019 0759   BILITOT 0.5 11/28/2014 1401  GFRNONAA 49 (L) 02/23/2019 0759   GFRNONAA >60 11/28/2014 1401   GFRAA 56 (L) 02/23/2019 0759   GFRAA >60 11/28/2014 1401    No results found for: SPEP, UPEP  Lab Results  Component Value Date   WBC 3.8 (L) 02/23/2019   NEUTROABS 2.3 02/23/2019   HGB 13.8 02/23/2019   HCT 39.5 02/23/2019   MCV 93.2 02/23/2019   PLT 293 02/23/2019      Chemistry      Component Value Date/Time   NA 132 (L) 02/23/2019 0759   NA 135 11/28/2014 1401   K 4.4 02/23/2019 0759   K 4.7 11/28/2014 1401   CL 98 02/23/2019 0759   CL 99 (L) 11/28/2014 1401   CO2 24 02/23/2019 0759   CO2 28 11/28/2014 1401   BUN 29 (H) 02/23/2019 0759   BUN 16 11/28/2014 1401   CREATININE 1.18 (H) 02/23/2019 0759   CREATININE 0.96 11/28/2014 1401      Component Value Date/Time   CALCIUM 9.4 02/23/2019 0759   CALCIUM 9.9 11/28/2014 1401   ALKPHOS 94 02/23/2019 0759   ALKPHOS 78 11/28/2014 1401   AST 57 (H) 02/23/2019 0759   AST 26 11/28/2014 1401   ALT 42 02/23/2019 0759   ALT 28 11/28/2014 1401   BILITOT 0.6 02/23/2019 0759   BILITOT 0.5 11/28/2014 1401       RADIOGRAPHIC STUDIES: I have personally reviewed the radiological images as listed and agreed with the findings in the report. No results found.   ASSESSMENT & PLAN:  Carcinoma of lower-outer quadrant of right breast in female, estrogen receptor positive ([place])  #Recurrent breast cancer-ER positive PR negative ? HER-2/neu-currently on Faslodex plus abema June 2020 CT A/P-NEG; except for 1.2 cm soft tissue nodule noted in the right anterior chest wall.  However noted this on a CT scan from October 2019.  Will get an ultrasound.  Clinically stable.  #Continue Faslodex plus Abema 100 BID; Labs today-white  count normal.  May 2020-2 marker 46 slightly elevated.   #History of right upper extremity DVT:on Eliquis-- Stable.   # CKD-III; creatinine 1.2.  Stable  # Elevated Bloog suagrs 404-poorly controlled secondary to steroids.  Recommend close monitoring.  Recommend follow-up with PCP.  # Elevated Blood pressure-poorly controlled from steroids 150s/100.  Monitor closely.  Recommend follow-up with PCP  # Exposure to poison ivy- on steroids taper; last dose of steroids today.  Improved.   DISPOSITION: # Faslodex/labs today  # [place] chest wall in 1 week.  # Follow up in 1 month MD/ labs (CBC/CMP/Ca 27-29) Faslodex- Dr.B     Orders Placed This Encounter  Procedures  . [place] CHEST SOFT TISSUE    Standing Status:   Future    Standing Expiration Date:   04/24/2020    Order Specific Question:   Reason for Exam (SYMPTOM  OR DIAGNOSIS REQUIRED)    Answer:   chest nodule; noted on CT scan; Hx of breast cancer    Order Specific Question:   Preferred imaging location?    Answer:   [facility]  . CBC with Differential    Standing Status:   Standing    Number of Occurrences:   12    Standing Expiration Date:   02/23/2020  . Comprehensive metabolic panel    Standing Status:   Standing    Number of Occurrences:   12    Standing Expiration Date:   02/23/2020  . Cancer antigen 27.29    Standing Status:  Standing    Number of Occurrences:   12    Standing Expiration Date:   02/23/2020   All questions were answered. The patient knows to call the clinic with any problems, questions or concerns.      [name], MD 02/23/2019 11:50 AM

## 2019-02-24 LAB — CANCER ANTIGEN 27.29: CA 27.29: 62.6 U/mL — ABNORMAL HIGH (ref 0.0–38.6)

## 2019-02-26 ENCOUNTER — Encounter: Payer: Self-pay | Admitting: Internal Medicine

## 2019-02-28 ENCOUNTER — Ambulatory Visit: Payer: BC Managed Care – PPO

## 2019-03-08 ENCOUNTER — Other Ambulatory Visit: Payer: Self-pay | Admitting: Pharmacist

## 2019-03-08 ENCOUNTER — Telehealth: Payer: Self-pay | Admitting: Pharmacy Technician

## 2019-03-08 DIAGNOSIS — C50511 Malignant neoplasm of lower-outer quadrant of right female breast: Secondary | ICD-10-CM

## 2019-03-08 DIAGNOSIS — Z170 Estrogen receptor positive status [ER+]: Secondary | ICD-10-CM

## 2019-03-08 MED ORDER — ABEMACICLIB 100 MG PO TABS: 100.0000 mg | ORAL_TABLET | Freq: Two times a day (BID) | ORAL | 4 refills | Status: DC

## 2019-03-08 NOTE — Telephone Encounter (Signed)
Oral Oncology Patient Advocate Encounter  Prior Authorization for Kimberly [name] has been approved.    PA# [id number] Effective dates: 03/08/2019 through 03/07/2020  Patient must fill at [facility].  I will follow up to check copay and delivery status.  Oral Oncology Clinic will continue to follow.   [place] Patient [place] Phone [phone] Fax [phone] 03/08/2019 12:05 PM

## 2019-03-08 NOTE — Progress Notes (Signed)
Oral Chemotherapy Pharmacist Encounter  Ms. Benally has had a change in prescription insurance. Due to insurance restrictions with her new insurance her medication can no longer be filled at [facility]. Prescription has been e-scribed to [facility].  Supportive information was faxed to [facility]. We will continue to follow medication access.   Darl Pikes, PharmD, BCPS, [facility] Hematology/Oncology Clinical Pharmacist [facility]/AP Oral [facility] [phone]  03/08/2019 1:43 PM

## 2019-03-08 NOTE — Telephone Encounter (Signed)
Oral Oncology Patient Advocate Encounter  Patient has new pharmacy insurance through [employer].  Received notification from [facility] that prior authorization for Verzenio is required.  PA submitted on CoverMyMeds Key [id number]  Status is pending  Oral Oncology Clinic will continue to follow.  Kimberly [name] Phone [phone] Fax [phone] 03/08/2019

## 2019-03-12 NOTE — Telephone Encounter (Signed)
Spoke with [facility] representative and they called patient but there was no answer- patient was out of town last week.  I gave them her cell phone number since that is the preferred number to reach her.  They are going to reach out to her again to set up shipment.  I also gave them the copay card information for the patient so her $100 copay would be $0.00.

## 2019-03-13 NOTE — Telephone Encounter (Signed)
Patient received medication today.

## 2019-03-19 ENCOUNTER — Telehealth: Payer: Self-pay | Admitting: *Deleted

## 2019-03-19 ENCOUNTER — Ambulatory Visit
Admission: RE | Admit: 2019-03-19 | Discharge: 2019-03-19 | Disposition: A | Discharge status: Home / Self Care | Payer: 59 | Source: Ambulatory Visit | Attending: Internal Medicine | Admitting: Internal Medicine

## 2019-03-19 ENCOUNTER — Other Ambulatory Visit: Payer: Self-pay

## 2019-03-19 DIAGNOSIS — C50511 Malignant neoplasm of lower-outer quadrant of right female breast: Secondary | ICD-10-CM | POA: Diagnosis not present

## 2019-03-19 DIAGNOSIS — Z170 Estrogen receptor positive status [ER+]: Secondary | ICD-10-CM | POA: Diagnosis not present

## 2019-03-19 DIAGNOSIS — R222 Localized swelling, mass and lump, trunk: Secondary | ICD-10-CM | POA: Insufficient documentation

## 2019-03-19 NOTE — Telephone Encounter (Signed)
Called report  IMPRESSION: There is a hypoechoic irregular mass with internal calcifications in the region of the patient's symptoms on the left. Involvement of the overlying skin and/or the underlying musculature is not excluded but not definitive. The internal calcifications raise the possibility of scarring or fat necrosis. However, malignancy is not excluded on this study. An MRI of the chest may help to confirm fat necrosis. If an MRI is not performed or if an MRI does not confirm fat necrosis, recommend ultrasound-guided biopsy to exclude malignancy.  These results will be called to the ordering clinician or representative by the Radiologist Assistant, and communication documented in the PACS or zVision Dashboard.   Electronically Signed   By: Dorise Bullion III M.D   On: 03/19/2019 15:18

## 2019-03-23 ENCOUNTER — Inpatient Hospital Stay (HOSPITAL_BASED_OUTPATIENT_CLINIC_OR_DEPARTMENT_OTHER): Payer: 59 | Admitting: Internal Medicine

## 2019-03-23 ENCOUNTER — Inpatient Hospital Stay: Payer: 59 | Attending: Internal Medicine

## 2019-03-23 ENCOUNTER — Other Ambulatory Visit: Payer: Self-pay

## 2019-03-23 ENCOUNTER — Encounter: Payer: Self-pay | Admitting: Internal Medicine

## 2019-03-23 ENCOUNTER — Inpatient Hospital Stay: Payer: 59

## 2019-03-23 VITALS — BP 142/89 | HR 94 | Temp 96.3°F | Resp 20 | Ht 62.0 in | Wt 178.0 lb

## 2019-03-23 DIAGNOSIS — Z7901 Long term (current) use of anticoagulants: Secondary | ICD-10-CM

## 2019-03-23 DIAGNOSIS — Z170 Estrogen receptor positive status [ER+]: Secondary | ICD-10-CM

## 2019-03-23 DIAGNOSIS — C50511 Malignant neoplasm of lower-outer quadrant of right female breast: Secondary | ICD-10-CM | POA: Diagnosis not present

## 2019-03-23 DIAGNOSIS — R978 Other abnormal tumor markers: Secondary | ICD-10-CM | POA: Diagnosis not present

## 2019-03-23 DIAGNOSIS — N183 Chronic kidney disease, stage 3 (moderate): Secondary | ICD-10-CM | POA: Diagnosis not present

## 2019-03-23 DIAGNOSIS — M2550 Pain in unspecified joint: Secondary | ICD-10-CM

## 2019-03-23 DIAGNOSIS — R197 Diarrhea, unspecified: Secondary | ICD-10-CM

## 2019-03-23 DIAGNOSIS — Z86718 Personal history of other venous thrombosis and embolism: Secondary | ICD-10-CM

## 2019-03-23 DIAGNOSIS — R222 Localized swelling, mass and lump, trunk: Secondary | ICD-10-CM | POA: Diagnosis not present

## 2019-03-23 DIAGNOSIS — G8929 Other chronic pain: Secondary | ICD-10-CM | POA: Diagnosis not present

## 2019-03-23 DIAGNOSIS — M545 Low back pain: Secondary | ICD-10-CM | POA: Diagnosis not present

## 2019-03-23 DIAGNOSIS — Z5111 Encounter for antineoplastic chemotherapy: Secondary | ICD-10-CM | POA: Insufficient documentation

## 2019-03-23 LAB — COMPREHENSIVE METABOLIC PANEL
ALT: 28 U/L (ref 0–44)
AST: 35 U/L (ref 15–41)
Albumin: 3.8 g/dL (ref 3.5–5.0)
Alkaline Phosphatase: 67 U/L (ref 38–126)
Anion gap: 9 (ref 5–15)
BUN: 14 mg/dL (ref 8–23)
CO2: 21 mmol/L — ABNORMAL LOW (ref 22–32)
Calcium: 9.1 mg/dL (ref 8.9–10.3)
Chloride: 105 mmol/L (ref 98–111)
Creatinine, Ser: 1.01 mg/dL — ABNORMAL HIGH (ref 0.44–1.00)
GFR calc Af Amer: >60 mL/min (ref >60)
GFR calc non Af Amer: 59 mL/min — ABNORMAL LOW (ref >60)
Glucose, Bld: 243 mg/dL — ABNORMAL HIGH (ref 70–99)
Potassium: 4.2 mmol/L (ref 3.5–5.1)
Sodium: 135 mmol/L (ref 135–145)
Total Bilirubin: 0.6 mg/dL (ref 0.3–1.2)
Total Protein: 7.1 g/dL (ref 6.5–8.1)

## 2019-03-23 LAB — CBC WITH DIFFERENTIAL/PLATELET
Abs Immature Granulocytes: 0.01 10*3/uL (ref 0.00–0.07)
Basophils Absolute: 0.1 10*3/uL (ref 0.0–0.1)
Basophils Relative: 2 %
Eosinophils Absolute: 0.1 10*3/uL (ref 0.0–0.5)
Eosinophils Relative: 2 %
HCT: 37.4 % (ref 36.0–46.0)
Hemoglobin: 13.1 g/dL (ref 12.0–15.0)
Immature Granulocytes: 0 %
Lymphocytes Relative: 45 %
Lymphs Abs: 1.5 10*3/uL (ref 0.7–4.0)
MCH: 33.3 pg (ref 26.0–34.0)
MCHC: 35 g/dL (ref 30.0–36.0)
MCV: 95.2 fL (ref 80.0–100.0)
Monocytes Absolute: 0.3 10*3/uL (ref 0.1–1.0)
Monocytes Relative: 8 %
Neutro Abs: 1.4 10*3/uL — ABNORMAL LOW (ref 1.7–7.7)
Neutrophils Relative %: 43 %
Platelets: 234 10*3/uL (ref 150–400)
RBC: 3.93 MIL/uL (ref 3.87–5.11)
RDW: 13.1 % (ref 11.5–15.5)
WBC: 3.3 10*3/uL — ABNORMAL LOW (ref 4.0–10.5)
nRBC: 0 % (ref 0.0–0.2)

## 2019-03-23 MED ORDER — FULVESTRANT 250 MG/5ML IM SOLN
500.0000 mg | Freq: Once | INTRAMUSCULAR | Status: AC
  Administered 2019-03-23: 500 mg via INTRAMUSCULAR
  Filled 2019-03-23: qty 10

## 2019-03-23 NOTE — Progress Notes (Signed)
.[place] OFFICE PROGRESS NOTE  Patient Care Team: Karen Kitchens, MD as PCP - General (Family Medicine) Neldon Mc, MD (General Surgery) Everlene Farrier, MD (Obstetrics and Gynecology) Noreene Filbert, MD Forest Gleason, MD (Inactive) (Unknown Physician Specialty)  Cancer Staging Carcinoma of lower-outer quadrant of right breast in female, estrogen receptor positive [facility]) Staging form: Breast, AJCC 7th Edition - Pathologic: ypT1c,ypN2a, MX - Signed by Haywood Lasso, MD on 03/10/2012    Oncology History Overview Note  # DEC 2012- RIGHT BREAST CA T2 N2 M0 tumor from biopsy.  Estrogen receptor positive, Progesterone receptor positive.  Current receptor negative by FISH 2. Neoadjuvant chemotherapy started in December of 28 with Cytoxan Adriamycin 3. Started on Taxol weekly chemotherapy. 4. Patient finished 12  cycles of Taxol chemotherapy in May of 2013.     5. Status post right modified radical mastectomy [Dr.Bowers; [place]] June of 2013, ypT1c  yp N2  MO. started also on Lerazole    7. Radiation therapy to the right breast (September of 2013).  Lymph node was positive for HER-2 receptor gene amplification of 2.22.  Will proceed with Herceptin treatment starting in September of 2013.   8.Patient has finished Herceptin (maintenance therapy) in August of 2014 8. Start patient on letrozole from November, 2013. 9. Patient started on Herceptin in September 2013.   10Patient finished 12 months of Herceptin therapy on August, 2014  # 6. Status post left side prophylactic mastectomy.  #Late MAY 2018-RECURRENCE BREAST CA- ER positive/PR negative; ?? HER-2/neu- [biopsy- proven-mediastinal lymph node; in suff for her 2 testing].  [elevated Tumor marker- CT/PET- uptake in Right Mediastinal LN; Sternum [June 2018 EBUS- Dr.Kasa]   # March 10 2017- faslodex + Abema; OCT 5th CT-PR of mediastinal LN --------------------------------------------    DIAGNOSIS: [ BREAST  CANCER- ER/PR/HER2 NEU POS  STAGE:  4   ;GOALS: PALLIATIVE  CURRENT/MOST RECENT THERAPY - ABEMA + FASLODEX    Carcinoma of lower-outer quadrant of right breast in female, estrogen receptor positive ([place])   INTERVAL HISTORY:  Kimberly [name] 63 y.o.  female pleasant patient above history of metastatic ER PR positive HER-2/neu positive breast cancer currently on abema+ Faslodex is here for follow-up/review the results of the ultrasound chest that was ordered for a chest nodule.  Patient denies any worsening left-sided chest nodule.  No pain.  Otherwise no nausea no vomiting.  Intermittent headaches.  Patient is concerned about rising tumor marker.  Chronic mild diarrhea not any worse.  Chronic joint pains back pain.  Review of Systems  Constitutional: Positive for malaise/fatigue. Negative for chills, diaphoresis, fever and weight loss.  HENT: Negative for nosebleeds and sore throat.   Eyes: Negative for double vision.  Respiratory: Negative for cough, hemoptysis, sputum production, shortness of breath and wheezing.   Cardiovascular: Negative for chest pain, palpitations, orthopnea and leg swelling.  Gastrointestinal: Positive for diarrhea. Negative for abdominal pain, blood in stool, constipation, heartburn, melena, nausea and vomiting.  Genitourinary: Negative for dysuria, frequency and urgency.  Musculoskeletal: Positive for back pain and joint pain.  Neurological: Negative for dizziness, tingling, focal weakness, weakness and headaches.  Endo/Heme/Allergies: Does not bruise/bleed easily.  Psychiatric/Behavioral: Negative for depression. The patient is not nervous/anxious and does not have insomnia.       PAST MEDICAL HISTORY :  Past Medical History:  Diagnosis Date  . Arthritis   . Cancer of lower-outer quadrant of female breast ([place]) 08/06/2011   RIGHT   . Diabetes mellitus ([place]) 03/24/2012  .  High cholesterol   . History of kidney stones   . Hx of bilateral breast  implants   . Hypertension   . PONV (postoperative nausea and vomiting)   . Type II diabetes mellitus ([facility])    fasting 140-150  . Vertigo    LAST WEEK  . Vertigo 01/2017    PAST SURGICAL HISTORY :   Past Surgical History:  Procedure Laterality Date  . BREAST BIOPSY  07/2011   right  . BREAST RECONSTRUCTION  03/07/2012   Procedure: BREAST RECONSTRUCTION;  Surgeon: Crissie Reese, MD;  Location: [place];  Service: Plastics;  Laterality: Left;  BREAST RECONSTRUCTION WITH PLACEMENT OF TISSUE EXPANDER TO LEFT BREAST  . BREAST RECONSTRUCTION  02/06/2013  . CESAREAN SECTION  [id number]  . ENDOBRONCHIAL ULTRASOUND N/A 02/24/2017   Procedure: ENDOBRONCHIAL ULTRASOUND;  Surgeon: [name], MD;  Location: [facility];  Service: Cardiopulmonary;  Laterality: N/A;  . LATISSIMUS FLAP TO BREAST Right 02/06/2013   Procedure: LATISSIMUS FLAP TO RIGHT BREAST WITH IMPLANT;  Surgeon: Crissie Reese, MD;  Location: [place];  Service: Plastics;  Laterality: Right;  . MASTECTOMY  03/07/12   modified right; total left  . MODIFIED MASTECTOMY  03/07/2012   Procedure: MODIFIED MASTECTOMY;  Surgeon: Haywood Lasso, MD;  Location: [place];  Service: General;  Laterality: Right;  . PORTACATH PLACEMENT  07/2011  . SCAR REVISION  03/30/2012   Procedure: SCAR REVISION;  Surgeon: Haywood Lasso, MD;  Location: [place];  Service: General;  Laterality: Right;  CLOSURE OF MASTECTOMY INCISION  . WISDOM TOOTH EXTRACTION      FAMILY HISTORY :   Family History  Problem Relation Age of Onset  . Cancer Mother        breast    SOCIAL HISTORY:   Social History   Tobacco Use  . Smoking status: Never Smoker  . Smokeless tobacco: Never Used  Substance Use Topics  . Alcohol use: No  . Drug use: No    ALLERGIES:  is allergic to sulfa antibiotics.  MEDICATIONS:  Current Outpatient Medications  Medication Sig Dispense Refill  . abemaciclib (VERZENIO) 100 MG tablet Take 1 tablet (100 mg total) by mouth 2  (two) times daily. 56 tablet 4  . apixaban (ELIQUIS) 5 MG TABS tablet Take 1 tablet (5 mg total) by mouth 2 (two) times daily. 60 tablet 6  . atorvastatin (LIPITOR) 20 MG tablet Take 20 mg by mouth every morning.    . Calcium Carbonate-Vitamin D (CALCIUM 600 + D PO) Take 1 tablet by mouth 2 (two) times daily.    . famotidine (PEPCID) 20 MG tablet Take 1 tablet (20 mg total) by mouth 2 (two) times daily. 10 tablet 0  . fulvestrant (FASLODEX) 250 MG/5ML injection Inject into the muscle every 30 (thirty) days. One injection each buttock over 1-2 minutes. Warm prior to use.    Marland Kitchen glipiZIDE (GLUCOTROL XL) 10 MG 24 hr tablet Take 10 mg by mouth 2 (two) times daily.  11  . levothyroxine (SYNTHROID, LEVOTHROID) 25 MCG tablet Take 1 tablet (25 mcg total) by mouth daily before breakfast. 90 tablet 1  . lisinopril (PRINIVIL,ZESTRIL) 10 MG tablet Take 10 mg by mouth every morning.     . loperamide (IMODIUM A-D) 2 MG tablet Take 2 mg by mouth 4 (four) times daily as needed for diarrhea or loose stools.    . mupirocin ointment (BACTROBAN) 2 % Apply 1 application topically 3 (three) times daily. 22 g 0  .  ondansetron (ZOFRAN) 8 MG tablet Take 1 tablet (8 mg total) by mouth every 8 (eight) hours as needed for nausea or vomiting. 20 tablet 3  . pantoprazole (PROTONIX) 40 MG tablet Take 1 tablet (40 mg total) by mouth daily. 30 mins prior to meals; On empty stomach 30 tablet 0  . prochlorperazine (COMPAZINE) 10 MG tablet Take 1 tablet (10 mg total) by mouth every 6 (six) hours as needed for nausea or vomiting. 30 tablet 0  . Semaglutide (OZEMPIC) 0.25 or 0.5 MG/DOSE SOPN Inject 0.25 mg into the skin once a week.     No current facility-administered medications for this visit.     PHYSICAL EXAMINATION: ECOG PERFORMANCE STATUS: 1 - Symptomatic but completely ambulatory  BP (!) 142/89   Pulse 94   Temp (!) 96.3 F (35.7 C) (Tympanic)   Resp 20   Ht '5\' 2"'  (1.575 m)   Wt 178 lb (80.7 kg)   BMI 32.56 kg/m    Filed Weights   03/23/19 0830  Weight: 178 lb (80.7 kg)    Physical Exam  Constitutional: She is oriented to person, place, and time and well-developed, well-nourished, and in no distress.  She is alone.   HENT:  Head: Normocephalic and atraumatic.  Mouth/Throat: Oropharynx is clear and moist. No oropharyngeal exudate.  Eyes: Pupils are equal, round, and reactive to light.  Neck: Normal range of motion. Neck supple.  Cardiovascular: Normal rate and regular rhythm.  Pulmonary/Chest: No respiratory distress. She has no wheezes.  Abdominal: Soft. Bowel sounds are normal. She exhibits no distension and no mass. There is no abdominal tenderness. There is no rebound and no guarding.  Musculoskeletal: Normal range of motion.        General: No tenderness or edema.     Comments: Approximately 1 cm hard nodule felt in the anterior chest wall/left parasternal.  Neurological: She is alert and oriented to person, place, and time.  Skin: Skin is warm.  Psychiatric: Affect normal.       LABORATORY DATA:  I have reviewed the data as listed    Component Value Date/Time   NA 135 03/23/2019 0803   NA 135 11/28/2014 1401   K 4.2 03/23/2019 0803   K 4.7 11/28/2014 1401   CL 105 03/23/2019 0803   CL 99 (L) 11/28/2014 1401   CO2 21 (L) 03/23/2019 0803   CO2 28 11/28/2014 1401   GLUCOSE 243 (H) 03/23/2019 0803   GLUCOSE 215 (H) 11/28/2014 1401   BUN 14 03/23/2019 0803   BUN 16 11/28/2014 1401   CREATININE 1.01 (H) 03/23/2019 0803   CREATININE 0.96 11/28/2014 1401   CALCIUM 9.1 03/23/2019 0803   CALCIUM 9.9 11/28/2014 1401   PROT 7.1 03/23/2019 0803   PROT 7.6 11/28/2014 1401   ALBUMIN 3.8 03/23/2019 0803   ALBUMIN 4.5 11/28/2014 1401   AST 35 03/23/2019 0803   AST 26 11/28/2014 1401   ALT 28 03/23/2019 0803   ALT 28 11/28/2014 1401   ALKPHOS 67 03/23/2019 0803   ALKPHOS 78 11/28/2014 1401   BILITOT 0.6 03/23/2019 0803   BILITOT 0.5 11/28/2014 1401   GFRNONAA 59 (L) 03/23/2019  0803   GFRNONAA >60 11/28/2014 1401   GFRAA >60 03/23/2019 0803   GFRAA >60 11/28/2014 1401    No results found for: SPEP, UPEP  Lab Results  Component Value Date   WBC 3.3 (L) 03/23/2019   NEUTROABS 1.4 (L) 03/23/2019   HGB 13.1 03/23/2019   HCT 37.4 03/23/2019  MCV 95.2 03/23/2019   PLT 234 03/23/2019      Chemistry      Component Value Date/Time   NA 135 03/23/2019 0803   NA 135 11/28/2014 1401   K 4.2 03/23/2019 0803   K 4.7 11/28/2014 1401   CL 105 03/23/2019 0803   CL 99 (L) 11/28/2014 1401   CO2 21 (L) 03/23/2019 0803   CO2 28 11/28/2014 1401   BUN 14 03/23/2019 0803   BUN 16 11/28/2014 1401   CREATININE 1.01 (H) 03/23/2019 0803   CREATININE 0.96 11/28/2014 1401      Component Value Date/Time   CALCIUM 9.1 03/23/2019 0803   CALCIUM 9.9 11/28/2014 1401   ALKPHOS 67 03/23/2019 0803   ALKPHOS 78 11/28/2014 1401   AST 35 03/23/2019 0803   AST 26 11/28/2014 1401   ALT 28 03/23/2019 0803   ALT 28 11/28/2014 1401   BILITOT 0.6 03/23/2019 0803   BILITOT 0.5 11/28/2014 1401       RADIOGRAPHIC STUDIES: I have personally reviewed the radiological images as listed and agreed with the findings in the report. No results found.   ASSESSMENT & PLAN:  Carcinoma of lower-outer quadrant of right breast in female, estrogen receptor positive ([place])  #Recurrent breast cancer-ER positive PR negative ? HER-2/neu-currently on Faslodex plus abema June 2020 CT A/P-NEG; except for 1.2 cm soft tissue nodule noted in the right anterior chest wall.  However noted this on a CT scan from October 2019 [see below]  #Continue Faslodex plus Abema 100 BID; Labs today-white count normal.  June 2020 tumor marker 9 [see below]  #Left anterior chest wall 1 cm nodule-ultrasound showed hypoechoic area with calcifications.  Question fat necrosis versus tumor.  Recommend ultrasound-guided biopsy.  Recommend holding Eliquis.  #Rising tumor marker-last month 63.  Recent CT scan no obvious  progression.  If continues to rise; given the headaches recommend MRI brain.  Again await tumor marker from today.  # History of right upper extremity DVT:on Eliquis- stable; will have to hold 3 days prior to Biopsy.   # CKD-III; creatinine 1.2.  Stable.   # Elevated Bloog sugars 243; awaiting reval with PCP.  # Elevated Blood pressure-better controlled.   DISPOSITION: # Faslodex/today  # [place] chest wall biopsy in 1-2 weeks.  # Follow up in 1 month MD/ labs (CBC/CMP/Ca 27-29) Faslodex- Dr.B     Orders Placed This Encounter  Procedures  . [place] CORE BIOPSY (SOFT TISSUE)    Standing Status:   Future    Standing Expiration Date:   05/23/2020    Order Specific Question:   Lab orders requested (DO NOT place separate lab orders, these will be automatically ordered during procedure specimen collection):    Answer:   Surgical Pathology    Order Specific Question:   Reason for Exam (SYMPTOM  OR DIAGNOSIS REQUIRED)    Answer:   chest wall nodule/ metatstatic breast cancer    Order Specific Question:   Preferred location?    Answer:   [facility]   All questions were answered. The patient knows to call the clinic with any problems, questions or concerns.      [name], MD 03/23/2019 9:24 AM

## 2019-03-23 NOTE — Patient Instructions (Signed)
#  Stop taking Eliquis 3 days before the day of biopsy; you can start taking Eliquis the day after the biopsy.

## 2019-03-23 NOTE — Assessment & Plan Note (Addendum)
#  Recurrent breast cancer-ER positive PR negative ? HER-2/neu-currently on Faslodex plus abema June 2020 CT A/P-NEG; except for 1.2 cm soft tissue nodule noted in the right anterior chest wall.  However noted this on a CT scan from October 2019 [see below]  #Continue Faslodex plus Abema 100 BID; Labs today-white count normal.  June 2020 tumor marker 48 [see below]  #Left anterior chest wall 1 cm nodule-ultrasound showed hypoechoic area with calcifications.  Question fat necrosis versus tumor.  Recommend ultrasound-guided biopsy.  Recommend holding Eliquis.  #Rising tumor marker-last month 25.  Recent CT scan no obvious progression.  If continues to rise; given the headaches recommend MRI brain.  Again await tumor marker from today.  # History of right upper extremity DVT:on Eliquis- stable; will have to hold 3 days prior to Biopsy.   # CKD-III; creatinine 1.2.  Stable.   # Elevated Bloog sugars 243; awaiting reval with PCP.  # Elevated Blood pressure-better controlled.   DISPOSITION: # Faslodex/today  # [place] chest wall biopsy in 1-2 weeks.  # Follow up in 1 month MD/ labs (CBC/CMP/Ca 27-29) Faslodex- Dr.B

## 2019-03-24 LAB — CANCER ANTIGEN 27.29: CA 27.29: 35.4 U/mL (ref 0.0–38.6)

## 2019-03-26 ENCOUNTER — Encounter: Payer: Self-pay | Admitting: Internal Medicine

## 2019-03-27 ENCOUNTER — Telehealth: Payer: Self-pay | Admitting: *Deleted

## 2019-03-27 ENCOUNTER — Other Ambulatory Visit: Payer: Self-pay | Admitting: Internal Medicine

## 2019-03-27 DIAGNOSIS — N6320 Unspecified lump in the left breast, unspecified quadrant: Secondary | ICD-10-CM

## 2019-03-27 DIAGNOSIS — C50511 Malignant neoplasm of lower-outer quadrant of right female breast: Secondary | ICD-10-CM

## 2019-03-27 DIAGNOSIS — R928 Other abnormal and inconclusive findings on diagnostic imaging of breast: Secondary | ICD-10-CM

## 2019-03-27 NOTE — Telephone Encounter (Signed)
Spoke with Janie in [facility]. The department will schedule the bx apt for patient and contact pt with this apt.

## 2019-03-27 NOTE — Telephone Encounter (Signed)
0938-Received call from specialty schedulers. Biopsy must be scheduled through the [facility]. Left msg with their scheduler, who will give the msg to the [facility] scheduling team.

## 2019-04-02 ENCOUNTER — Telehealth: Payer: Self-pay

## 2019-04-02 NOTE — Telephone Encounter (Signed)
Pt called and scheduled her DM follow up can you look at her paper chart and see what labs you would like prior looks like she hasnt had any since sept 2019.

## 2019-04-02 NOTE — Telephone Encounter (Signed)
Pt aware no labs prior see MD and do A1c in office that day

## 2019-04-02 NOTE — Telephone Encounter (Signed)
Let's see her first and check A1C in the office. Then can decide on lab tests ordered then or in the future.

## 2019-04-03 ENCOUNTER — Ambulatory Visit
Admission: RE | Admit: 2019-04-03 | Discharge: 2019-04-03 | Disposition: A | Discharge status: Home / Self Care | Payer: 59 | Source: Ambulatory Visit | Attending: Internal Medicine | Admitting: Internal Medicine

## 2019-04-03 ENCOUNTER — Other Ambulatory Visit: Payer: Self-pay

## 2019-04-03 DIAGNOSIS — N6320 Unspecified lump in the left breast, unspecified quadrant: Secondary | ICD-10-CM

## 2019-04-03 DIAGNOSIS — C50511 Malignant neoplasm of lower-outer quadrant of right female breast: Secondary | ICD-10-CM | POA: Diagnosis not present

## 2019-04-03 DIAGNOSIS — R928 Other abnormal and inconclusive findings on diagnostic imaging of breast: Secondary | ICD-10-CM | POA: Insufficient documentation

## 2019-04-04 ENCOUNTER — Telehealth: Payer: Self-pay | Admitting: Internal Medicine

## 2019-04-04 LAB — SURGICAL PATHOLOGY

## 2019-04-04 NOTE — Telephone Encounter (Signed)
Spoke to patient regarding results of the chest wall biopsy negative for malignancy follow-up as planned

## 2019-04-05 ENCOUNTER — Other Ambulatory Visit: Payer: Self-pay

## 2019-04-10 NOTE — Progress Notes (Signed)
Kimberly [name] - Patient is a 63 y.o. white female with a h/o stage 4 metastatic breast CA whose blood sugars have been high on recent checks and f/u'Kimberly [name] today as a result.  A/P from July, 2020 office visit with oncology and goals noted to be palliative:  #Recurrent breast cancer-ER positive PR negative ? HER-2/neu-currently on Faslodex plus abema June 2020 CT A/P-NEG; except for 1.2 cm soft tissue nodule noted in the right anterior chest wall.  However noted this on a CT scan from October 2019 [see below]  #Continue Faslodex plus Abema 100 BID; Labs today-white count normal.  June 2020 tumor marker 13 [see below]  #Left anterior chest wall 1 cm nodule-ultrasound showed hypoechoic area with calcifications.  Question fat necrosis versus tumor.  Recommend ultrasound-guided biopsy.  Recommend holding Eliquis.  #Rising tumor marker-last month 73.  Recent CT scan no obvious progression.  If continues to rise; given the headaches recommend MRI brain.  Again await tumor marker from today.  # History of right upper extremity DVT:on Eliquis- stable; will have to hold 3 days prior to Biopsy.   # CKD-III; creatinine 1.2.  Stable.   # Elevated Bloog sugars 243; awaiting reval with PCP.  # Elevated Blood pressure-better controlled.   DISPOSITION: # Faslodex/today  # [place] chest wall biopsy in 1-2 weeks.  # Follow up in 1 month MD/ labs (CBC/CMP/Ca 27-29) Faslodex- Dr.B   All in all, she notes she has some good days, and some not so good days, related to her cancer recurrence and she sees oncology monthly presently. Her right arm remains swollen after the blood clot occurred in the past, and she has had big toe neuropathy bilat since her first chemo years ago, has not progressed to up the foot or legs. Occas her ankles may have some mild swelling, not marked, and no calf pains or swelling. Denied any abdominal pains, eating ok.  She was more relieved that the most recent tumor marker was back in the 30'Kimberly [name] (60'Kimberly [name]  the one prior). Seemed more upbeat from this result.  She notes she does urinate more frequently intermittently, she thinks secondary to her increase in fluids. She can check her sugar at home, and does rarely when she may be feeling like it may be low, which is about once a week at most. Has not been low on checks.    Allergies  Allergen Reactions  . Sulfa Antibiotics Rash   Current Outpatient Medications on File Prior to Visit  Medication Sig Dispense Refill  . abemaciclib (VERZENIO) 100 MG tablet Take 1 tablet (100 mg total) by mouth 2 (two) times daily. 56 tablet 4  . apixaban (ELIQUIS) 5 MG TABS tablet Take 1 tablet (5 mg total) by mouth 2 (two) times daily. 60 tablet 6  . atorvastatin (LIPITOR) 20 MG tablet Take 20 mg by mouth every morning.    . Calcium Carbonate-Vitamin D (CALCIUM 600 + D PO) Take 1 tablet by mouth 2 (two) times daily.    . fulvestrant (FASLODEX) 250 MG/5ML injection Inject into the muscle every 30 (thirty) days. One injection each buttock over 1-2 minutes. Warm prior to use.    Marland Kitchen glipiZIDE (GLUCOTROL XL) 10 MG 24 hr tablet Take 10 mg by mouth 2 (two) times daily.  11  . levothyroxine (SYNTHROID, LEVOTHROID) 25 MCG tablet Take 1 tablet (25 mcg total) by mouth daily before breakfast. 90 tablet 1  . lisinopril (PRINIVIL,ZESTRIL) 10 MG tablet Take 10 mg by mouth every morning.     Marland Kitchen  loperamide (IMODIUM A-D) 2 MG tablet Take 2 mg by mouth 4 (four) times daily as needed for diarrhea or loose stools.    . mupirocin ointment (BACTROBAN) 2 % Apply 1 application topically 3 (three) times daily. 22 g 0  . ondansetron (ZOFRAN) 8 MG tablet Take 1 tablet (8 mg total) by mouth every 8 (eight) hours as needed for nausea or vomiting. 20 tablet 3  . Semaglutide (OZEMPIC) 0.25 or 0.5 MG/DOSE SOPN Inject 0.25 mg into the skin once a week.     No current facility-administered medications on file prior to visit.      Tob - never smoker  FH - M with breast CA, D with DM  O - NAD,  masked  BP (!) 136/93 (BP Location: Left Arm, Patient Position: Sitting, Cuff Size: Large)   Pulse 88   Temp (!) 97.5 F (36.4 C) (Oral)   Resp 14   Ht 5' 2" (1.575 m)   Wt 172 lb (78 kg)   SpO2 97%   BMI 31.46 kg/m   BP - 142/89 on last check in July  HEENT - sclera anicteric, hair thinning Car - RRR without m Pulm _ CTA Abd - soft, NT Ext - + swelling in her R UE below the elbow (noted not new and has persisted after the clot), trace LE edema, calves NT and no swelling, cords, increased warmth or erythema noted. Affect was not flat, approp with conversation  A1C - 7.7 (checked in office this morning),  Last labs reviewed - glucose 243, Bun/Cr - 14/1.01 (Cr was 1.18 a month prior), GFR - 59 (49 a month prior), WBC - 3.3, H/H - 13.1/37.4, plts - 234K, recent tumor marker was improved - 35.4 (in 60'Kimberly [name] prior).   Ass/Plan: 1. DM - not insulin dependent - A1C check today ok, <8  Noted to patient today that the goals are not for strict control of her BS'Kimberly [name] at present, and ok with a reading of 200, And given some concerns for nausea, decreased oral intake at times, very important to not have sugars that are too low develop and she understood. Metformin not best as can cause GI SE'Kimberly [name] and she had them prior when tried and also if any imaging needed with contrast would have to be stopped prior.  Her kidney function was ok on most recent check and she has done well with the Ozempic and will continue. Also can continue the glipizide as taking presently, but may need to lessen or stop that in the near future noted.  She will continue to check BS'Kimberly [name] as needed, and not need to check daily with any regularity  2. HTN - borderline to slightly higher reading noted on past couple readings   Continue current BP medication to manage, not feel increasing medication best at present  Noted to her that the lisinopril can help protect the kidneys with her DM noted   3.  Hyperlipidemia hx  Ok for statin to  continue at present as has been tolerating to date Liver tests were ok on last check in July, and if they start to increase, likely recommend to stop taking this med  4. Breast CA - recurrence noted as per oncology (reviewed last notes), Stage 4 and metastatic  Cont to see oncology monthly with treatment as recommended  5. H/o UE blood clot  Continue eliquis per prior rec'Kimberly [name] (hypercoagulable state with CA noted)  6. CKD - Creatine improved on most recent check with GFR 59  7. Hypothyroid - on levothyroxine and continue presently  Will need a TSH check with a future lab, was 3.05 February 2018,  and 6.06 Jul 2018.  F/u again in 3 months, sooner prn

## 2019-04-11 ENCOUNTER — Ambulatory Visit: Payer: Self-pay | Admitting: Internal Medicine

## 2019-04-11 ENCOUNTER — Other Ambulatory Visit: Payer: Self-pay

## 2019-04-11 ENCOUNTER — Encounter: Payer: Self-pay | Admitting: Internal Medicine

## 2019-04-11 VITALS — BP 136/93 | HR 88 | Temp 97.5°F | Resp 14 | Ht 62.0 in | Wt 172.0 lb

## 2019-04-11 DIAGNOSIS — E1122 Type 2 diabetes mellitus with diabetic chronic kidney disease: Secondary | ICD-10-CM

## 2019-04-11 LAB — POCT GLYCOSYLATED HEMOGLOBIN (HGB A1C): Hemoglobin A1C: 7.7 % — AB (ref 4.0–5.6)

## 2019-04-11 MED ORDER — LISINOPRIL 10 MG PO TABS: 10.0000 mg | ORAL_TABLET | ORAL | 3 refills | Status: DC

## 2019-04-11 MED ORDER — ATORVASTATIN CALCIUM 20 MG PO TABS: 20.0000 mg | ORAL_TABLET | Freq: Every day | ORAL | 3 refills | Status: AC

## 2019-04-11 MED ORDER — GLIPIZIDE ER 10 MG PO TB24: 10.0000 mg | ORAL_TABLET | Freq: Two times a day (BID) | ORAL | 3 refills | Status: AC

## 2019-04-11 MED ORDER — OZEMPIC (0.25 OR 0.5 MG/DOSE) 2 MG/1.5ML ~~LOC~~ SOPN: 0.2500 mg | PEN_INJECTOR | SUBCUTANEOUS | 5 refills | Status: DC

## 2019-04-12 ENCOUNTER — Ambulatory Visit: Payer: Self-pay | Admitting: Internal Medicine

## 2019-04-19 ENCOUNTER — Other Ambulatory Visit: Payer: Self-pay

## 2019-04-19 ENCOUNTER — Other Ambulatory Visit: Payer: Self-pay | Admitting: *Deleted

## 2019-04-20 ENCOUNTER — Other Ambulatory Visit: Payer: Self-pay

## 2019-04-20 ENCOUNTER — Inpatient Hospital Stay (HOSPITAL_BASED_OUTPATIENT_CLINIC_OR_DEPARTMENT_OTHER): Payer: 59 | Admitting: Internal Medicine

## 2019-04-20 ENCOUNTER — Inpatient Hospital Stay: Payer: 59 | Attending: Internal Medicine

## 2019-04-20 ENCOUNTER — Inpatient Hospital Stay: Payer: 59

## 2019-04-20 ENCOUNTER — Encounter: Payer: Self-pay | Admitting: Internal Medicine

## 2019-04-20 DIAGNOSIS — Z86718 Personal history of other venous thrombosis and embolism: Secondary | ICD-10-CM | POA: Diagnosis not present

## 2019-04-20 DIAGNOSIS — Z79899 Other long term (current) drug therapy: Secondary | ICD-10-CM | POA: Insufficient documentation

## 2019-04-20 DIAGNOSIS — Z5111 Encounter for antineoplastic chemotherapy: Secondary | ICD-10-CM | POA: Insufficient documentation

## 2019-04-20 DIAGNOSIS — I10 Essential (primary) hypertension: Secondary | ICD-10-CM | POA: Diagnosis not present

## 2019-04-20 DIAGNOSIS — Z9013 Acquired absence of bilateral breasts and nipples: Secondary | ICD-10-CM | POA: Insufficient documentation

## 2019-04-20 DIAGNOSIS — Z7901 Long term (current) use of anticoagulants: Secondary | ICD-10-CM | POA: Insufficient documentation

## 2019-04-20 DIAGNOSIS — E119 Type 2 diabetes mellitus without complications: Secondary | ICD-10-CM | POA: Diagnosis not present

## 2019-04-20 DIAGNOSIS — Z7984 Long term (current) use of oral hypoglycemic drugs: Secondary | ICD-10-CM | POA: Diagnosis not present

## 2019-04-20 DIAGNOSIS — Z803 Family history of malignant neoplasm of breast: Secondary | ICD-10-CM | POA: Insufficient documentation

## 2019-04-20 DIAGNOSIS — N183 Chronic kidney disease, stage 3 (moderate): Secondary | ICD-10-CM | POA: Diagnosis not present

## 2019-04-20 DIAGNOSIS — Z923 Personal history of irradiation: Secondary | ICD-10-CM | POA: Diagnosis not present

## 2019-04-20 DIAGNOSIS — C50511 Malignant neoplasm of lower-outer quadrant of right female breast: Secondary | ICD-10-CM

## 2019-04-20 DIAGNOSIS — Z9221 Personal history of antineoplastic chemotherapy: Secondary | ICD-10-CM | POA: Diagnosis not present

## 2019-04-20 DIAGNOSIS — Z170 Estrogen receptor positive status [ER+]: Secondary | ICD-10-CM

## 2019-04-20 LAB — COMPREHENSIVE METABOLIC PANEL
ALT: 24 U/L (ref 0–44)
AST: 32 U/L (ref 15–41)
Albumin: 3.7 g/dL (ref 3.5–5.0)
Alkaline Phosphatase: 79 U/L (ref 38–126)
Anion gap: 10 (ref 5–15)
BUN: 13 mg/dL (ref 8–23)
CO2: 22 mmol/L (ref 22–32)
Calcium: 9.4 mg/dL (ref 8.9–10.3)
Chloride: 102 mmol/L (ref 98–111)
Creatinine, Ser: 0.98 mg/dL (ref 0.44–1.00)
GFR calc Af Amer: >60 mL/min (ref >60)
GFR calc non Af Amer: >60 mL/min (ref >60)
Glucose, Bld: 253 mg/dL — ABNORMAL HIGH (ref 70–99)
Potassium: 4.3 mmol/L (ref 3.5–5.1)
Sodium: 134 mmol/L — ABNORMAL LOW (ref 135–145)
Total Bilirubin: 0.5 mg/dL (ref 0.3–1.2)
Total Protein: 7 g/dL (ref 6.5–8.1)

## 2019-04-20 LAB — CBC WITH DIFFERENTIAL/PLATELET
Abs Immature Granulocytes: 0.01 10*3/uL (ref 0.00–0.07)
Basophils Absolute: 0.1 10*3/uL (ref 0.0–0.1)
Basophils Relative: 2 %
Eosinophils Absolute: 0.1 10*3/uL (ref 0.0–0.5)
Eosinophils Relative: 2 %
HCT: 35.1 % — ABNORMAL LOW (ref 36.0–46.0)
Hemoglobin: 12 g/dL (ref 12.0–15.0)
Immature Granulocytes: 0 %
Lymphocytes Relative: 46 %
Lymphs Abs: 1.3 10*3/uL (ref 0.7–4.0)
MCH: 32.3 pg (ref 26.0–34.0)
MCHC: 34.2 g/dL (ref 30.0–36.0)
MCV: 94.4 fL (ref 80.0–100.0)
Monocytes Absolute: 0.2 10*3/uL (ref 0.1–1.0)
Monocytes Relative: 9 %
Neutro Abs: 1.1 10*3/uL — ABNORMAL LOW (ref 1.7–7.7)
Neutrophils Relative %: 41 %
Platelets: 212 10*3/uL (ref 150–400)
RBC: 3.72 MIL/uL — ABNORMAL LOW (ref 3.87–5.11)
RDW: 12.9 % (ref 11.5–15.5)
WBC: 2.7 10*3/uL — ABNORMAL LOW (ref 4.0–10.5)
nRBC: 0 % (ref 0.0–0.2)

## 2019-04-20 MED ORDER — FULVESTRANT 250 MG/5ML IM SOLN
500.0000 mg | Freq: Once | INTRAMUSCULAR | Status: AC
  Administered 2019-04-20: 500 mg via INTRAMUSCULAR
  Filled 2019-04-20: qty 10

## 2019-04-20 NOTE — Assessment & Plan Note (Addendum)
#  Recurrent breast cancer-ER positive PR negative ? HER-2/neu-currently on Faslodex plus abema June 2020 CT A/P-NEG; except for 1.2 cm soft tissue nodule noted in the right anterior chest wall s/p Biopsy-see below.  #Continue Faslodex plus Abema 100 BID; Labs today-white count low/ANC 1.1; monitor for now.   June 2020 tumor marker 36; improving.   #Left anterior chest wall 1 cm nodule-ultrasound showed hypoechoic area with calcifications.  S/p Biopsy- benign.   # History of right upper extremity DVT:on Eliquis- STABLE.   # CKD-III; creatinine 1.0 stable  # Elevated Blood pressure-better controlled.   DISPOSITION: # Faslodex/today  # Follow up in 1 month MD/ labs (CBC/CMP/Ca 27-29) Faslodex- Dr.B

## 2019-04-20 NOTE — Progress Notes (Signed)
.[place] OFFICE PROGRESS NOTE  Patient Care Team: Towanda Malkin, MD as PCP - General (Internal Medicine) Neldon Mc, MD (General Surgery) Everlene Farrier, MD (Obstetrics and Gynecology) Noreene Filbert, MD Forest Gleason, MD (Inactive) (Unknown Physician Specialty)  Cancer Staging Carcinoma of lower-outer quadrant of right breast in female, estrogen receptor positive [facility]) Staging form: Breast, AJCC 7th Edition - Pathologic: ypT1c,ypN2a, MX - Signed by Haywood Lasso, MD on 03/10/2012    Oncology History Overview Note  # DEC 2012- RIGHT BREAST CA T2 N2 M0 tumor from biopsy.  Estrogen receptor positive, Progesterone receptor positive.  Current receptor negative by FISH 2. Neoadjuvant chemotherapy started in December of 28 with Cytoxan Adriamycin 3. Started on Taxol weekly chemotherapy. 4. Patient finished 12  cycles of Taxol chemotherapy in May of 2013.     5. Status post right modified radical mastectomy [Dr.Bowers; [place]] June of 2013, ypT1c  yp N2  MO. started also on Lerazole    7. Radiation therapy to the right breast (September of 2013).  Lymph node was positive for HER-2 receptor gene amplification of 2.22.  Will proceed with Herceptin treatment starting in September of 2013.   8.Patient has finished Herceptin (maintenance therapy) in August of 2014 8. Start patient on letrozole from November, 2013. 9. Patient started on Herceptin in September 2013.    10Patient finished 12 months of Herceptin therapy on August, 2014  # 6. Status post left side prophylactic mastectomy.  #Late MAY 2018-RECURRENCE BREAST CA- ER positive/PR negative; ?? HER-2/neu- [biopsy- proven-mediastinal lymph node; in suff for her 2 testing].  [elevated Tumor marker- CT/PET- uptake in Right Mediastinal LN; Sternum [June 2018 [facility]US- Dr.Kasa]   # March 10 2017- faslodex + Abema; OCT 5th CT-PR of mediastinal LN  #August 2020-left chest wall nodule biopsy  benign --------------------------------------------    DIAGNOSIS: [ BREAST CANCER- ER/PR/HER2 NEU POS  STAGE:  4   ;GOALS: PALLIATIVE  CURRENT/MOST RECENT THERAPY - ABEMA + FASLODEX    Carcinoma of lower-outer quadrant of right breast in female, estrogen receptor positive ([place])   INTERVAL HISTORY:  Kimberly [name] 63 y.o.  female pleasant patient above history of metastatic ER PR positive HER-2/neu positive breast cancer currently on abema+ Faslodex is here for follow-up.  In the interim patient had biopsy of the left chest wall nodule-benign.  Patient is very relieved.  Patient was also evaluated by PCP-states her A1c is 7.4.  No fever no chills preoperative good pain no weight loss.  Nausea no vomiting no swelling the legs.  Review of Systems  Constitutional: Positive for malaise/fatigue. Negative for chills, diaphoresis, fever and weight loss.  HENT: Negative for nosebleeds and sore throat.   Eyes: Negative for double vision.  Respiratory: Negative for cough, hemoptysis, sputum production, shortness of breath and wheezing.   Cardiovascular: Negative for chest pain, palpitations, orthopnea and leg swelling.  Gastrointestinal: Positive for diarrhea. Negative for abdominal pain, blood in stool, constipation, heartburn, melena, nausea and vomiting.  Genitourinary: Negative for dysuria, frequency and urgency.  Musculoskeletal: Positive for back pain and joint pain.  Neurological: Negative for dizziness, tingling, focal weakness, weakness and headaches.  Endo/Heme/Allergies: Does not bruise/bleed easily.  Psychiatric/Behavioral: Negative for depression. The patient is not nervous/anxious and does not have insomnia.       PAST MEDICAL HISTORY :  Past Medical History:  Diagnosis Date  . Arthritis   . Breast cancer ([place])    current in between breast   . Cancer of lower-outer quadrant  of female breast ([place]) 08/06/2011   RIGHT   . High cholesterol   . History of kidney stones    . Hx of bilateral breast implants   . Hypertension   . PONV (postoperative nausea and vomiting)   . Type II diabetes mellitus ([facility])    fasting 140-150  . Vertigo    LAST WEEK  . Vertigo 01/2017    PAST SURGICAL HISTORY :   Past Surgical History:  Procedure Laterality Date  . BREAST BIOPSY  07/2011, 2020   right  . BREAST RECONSTRUCTION  03/07/2012   Procedure: BREAST RECONSTRUCTION;  Surgeon: Crissie Reese, MD;  Location: [place];  Service: Plastics;  Laterality: Left;  BREAST RECONSTRUCTION WITH PLACEMENT OF TISSUE EXPANDER TO LEFT BREAST  . BREAST RECONSTRUCTION  02/06/2013  . CESAREAN SECTION  [id number]  . ENDOBRONCHIAL ULTRASOUND N/A 02/24/2017   Procedure: ENDOBRONCHIAL ULTRASOUND;  Surgeon: [name], MD;  Location: [facility];  Service: Cardiopulmonary;  Laterality: N/A;  . LATISSIMUS FLAP TO BREAST Right 02/06/2013   Procedure: LATISSIMUS FLAP TO RIGHT BREAST WITH IMPLANT;  Surgeon: Crissie Reese, MD;  Location: [place];  Service: Plastics;  Laterality: Right;  . MASTECTOMY  03/07/12   modified right; total left  . MODIFIED MASTECTOMY  03/07/2012   Procedure: MODIFIED MASTECTOMY;  Surgeon: Haywood Lasso, MD;  Location: [place];  Service: General;  Laterality: Right;  . PORTACATH PLACEMENT  07/2011  . SCAR REVISION  03/30/2012   Procedure: SCAR REVISION;  Surgeon: Haywood Lasso, MD;  Location: [place];  Service: General;  Laterality: Right;  CLOSURE OF MASTECTOMY INCISION  . WISDOM TOOTH EXTRACTION      FAMILY HISTORY :   Family History  Problem Relation Age of Onset  . Breast cancer Mother   . Diabetes Father     SOCIAL HISTORY:   Social History   Tobacco Use  . Smoking status: Never Smoker  . Smokeless tobacco: Never Used  Substance Use Topics  . Alcohol use: No  . Drug use: No    ALLERGIES:  is allergic to sulfa antibiotics.  MEDICATIONS:  Current Outpatient Medications  Medication Sig Dispense Refill  . abemaciclib (VERZENIO) 100 MG  tablet Take 1 tablet (100 mg total) by mouth 2 (two) times daily. 56 tablet 4  . apixaban (ELIQUIS) 5 MG TABS tablet Take 1 tablet (5 mg total) by mouth 2 (two) times daily. 60 tablet 6  . atorvastatin (LIPITOR) 20 MG tablet Take 1 tablet (20 mg total) by mouth daily. 90 tablet 3  . Calcium Carbonate-Vitamin D (CALCIUM 600 + D PO) Take 1 tablet by mouth 2 (two) times daily.    . fulvestrant (FASLODEX) 250 MG/5ML injection Inject into the muscle every 30 (thirty) days. One injection each buttock over 1-2 minutes. Warm prior to use.    Marland Kitchen glipiZIDE (GLUCOTROL XL) 10 MG 24 hr tablet Take 1 tablet (10 mg total) by mouth 2 (two) times daily. 180 tablet 3  . levothyroxine (SYNTHROID, LEVOTHROID) 25 MCG tablet Take 1 tablet (25 mcg total) by mouth daily before breakfast. 90 tablet 1  . lisinopril (ZESTRIL) 10 MG tablet Take 1 tablet (10 mg total) by mouth every morning. 90 tablet 3  . loperamide (IMODIUM A-D) 2 MG tablet Take 2 mg by mouth 4 (four) times daily as needed for diarrhea or loose stools.    . mupirocin ointment (BACTROBAN) 2 % Apply 1 application topically 3 (three) times daily. 22 g 0  . ondansetron (  ZOFRAN) 8 MG tablet Take 1 tablet (8 mg total) by mouth every 8 (eight) hours as needed for nausea or vomiting. 20 tablet 3  . Semaglutide,0.25 or 0.5MG/DOS, (OZEMPIC, 0.25 OR 0.5 MG/DOSE,) 2 MG/1.5ML SOPN Inject 0.25 mg into the skin once a week. 1.5 mL 5   No current facility-administered medications for this visit.    Facility-Administered Medications Ordered in Other Visits  Medication Dose Route Frequency Provider Last Rate Last Dose  . fulvestrant (FASLODEX) injection 500 mg  500 mg Intramuscular Once [name], MD        PHYSICAL EXAMINATION: ECOG PERFORMANCE STATUS: 1 - Symptomatic but completely ambulatory  BP 122/83 (BP Location: Left Arm, Patient Position: Sitting, Cuff Size: Normal)   Pulse 93   Temp 97.9 F (36.6 C) (Tympanic)   Resp 16   Wt 174 lb (78.9 kg)    BMI 31.83 kg/m   Filed Weights   04/20/19 0928  Weight: 174 lb (78.9 kg)    Physical Exam  Constitutional: She is oriented to person, place, and time and well-developed, well-nourished, and in no distress.  She is alone.   HENT:  Head: Normocephalic and atraumatic.  Mouth/Throat: Oropharynx is clear and moist. No oropharyngeal exudate.  Eyes: Pupils are equal, round, and reactive to light.  Neck: Normal range of motion. Neck supple.  Cardiovascular: Normal rate and regular rhythm.  Pulmonary/Chest: No respiratory distress. She has no wheezes.  Abdominal: Soft. Bowel sounds are normal. She exhibits no distension and no mass. There is no abdominal tenderness. There is no rebound and no guarding.  Musculoskeletal: Normal range of motion.        General: No tenderness or edema.     Comments: Approximately 1 cm hard nodule felt in the anterior chest wall/left parasternal.  Neurological: She is alert and oriented to person, place, and time.  Skin: Skin is warm.  Psychiatric: Affect normal.       LABORATORY DATA:  I have reviewed the data as listed    Component Value Date/Time   NA 134 (L) 04/20/2019 0848   NA 135 11/28/2014 1401   K 4.3 04/20/2019 0848   K 4.7 11/28/2014 1401   CL 102 04/20/2019 0848   CL 99 (L) 11/28/2014 1401   CO2 22 04/20/2019 0848   CO2 28 11/28/2014 1401   GLUCOSE 253 (H) 04/20/2019 0848   GLUCOSE 215 (H) 11/28/2014 1401   BUN 13 04/20/2019 0848   BUN 16 11/28/2014 1401   CREATININE 0.98 04/20/2019 0848   CREATININE 0.96 11/28/2014 1401   CALCIUM 9.4 04/20/2019 0848   CALCIUM 9.9 11/28/2014 1401   PROT 7.0 04/20/2019 0848   PROT 7.6 11/28/2014 1401   ALBUMIN 3.7 04/20/2019 0848   ALBUMIN 4.5 11/28/2014 1401   AST 32 04/20/2019 0848   AST 26 11/28/2014 1401   ALT 24 04/20/2019 0848   ALT 28 11/28/2014 1401   ALKPHOS 79 04/20/2019 0848   ALKPHOS 78 11/28/2014 1401   BILITOT 0.5 04/20/2019 0848   BILITOT 0.5 11/28/2014 1401   GFRNONAA >60  04/20/2019 0848   GFRNONAA >60 11/28/2014 1401   GFRAA >60 04/20/2019 0848   GFRAA >60 11/28/2014 1401    No results found for: SPEP, UPEP  Lab Results  Component Value Date   WBC 2.7 (L) 04/20/2019   NEUTROABS 1.1 (L) 04/20/2019   HGB 12.0 04/20/2019   HCT 35.1 (L) 04/20/2019   MCV 94.4 04/20/2019   PLT 212 04/20/2019  Chemistry      Component Value Date/Time   NA 134 (L) 04/20/2019 0848   NA 135 11/28/2014 1401   K 4.3 04/20/2019 0848   K 4.7 11/28/2014 1401   CL 102 04/20/2019 0848   CL 99 (L) 11/28/2014 1401   CO2 22 04/20/2019 0848   CO2 28 11/28/2014 1401   BUN 13 04/20/2019 0848   BUN 16 11/28/2014 1401   CREATININE 0.98 04/20/2019 0848   CREATININE 0.96 11/28/2014 1401      Component Value Date/Time   CALCIUM 9.4 04/20/2019 0848   CALCIUM 9.9 11/28/2014 1401   ALKPHOS 79 04/20/2019 0848   ALKPHOS 78 11/28/2014 1401   AST 32 04/20/2019 0848   AST 26 11/28/2014 1401   ALT 24 04/20/2019 0848   ALT 28 11/28/2014 1401   BILITOT 0.5 04/20/2019 0848   BILITOT 0.5 11/28/2014 1401       RADIOGRAPHIC STUDIES: I have personally reviewed the radiological images as listed and agreed with the findings in the report. No results found.   ASSESSMENT & PLAN:  Carcinoma of lower-outer quadrant of right breast in female, estrogen receptor positive ([place])  #Recurrent breast cancer-ER positive PR negative ? HER-2/neu-currently on Faslodex plus abema June 2020 CT A/P-NEG; except for 1.2 cm soft tissue nodule noted in the right anterior chest wall s/p Biopsy.   #Continue Faslodex plus Abema 100 BID; Labs today-white count low/ANC 1.1; monitor for now.   June 2020 tumor marker 36; improving.   #Left anterior chest wall 1 cm nodule-ultrasound showed hypoechoic area with calcifications.  S/p Biopsy- benign.   # History of right upper extremity DVT:on Eliquis- STABLE.   # CKD-III; creatinine 1.0 stable  # Elevated Blood pressure-better controlled.   DISPOSITION: #  Faslodex/today  # Follow up in 1 month MD/ labs (CBC/CMP/Ca 27-29) Faslodex- Dr.B     No orders of the defined types were placed in this encounter.  All questions were answered. The patient knows to call the clinic with any problems, questions or concerns.      [name], MD 04/20/2019 9:44 AM

## 2019-04-21 LAB — CANCER ANTIGEN 27.29: CA 27.29: 35 U/mL (ref 0.0–38.6)

## 2019-05-12 ENCOUNTER — Other Ambulatory Visit: Payer: Self-pay | Admitting: Internal Medicine

## 2019-05-12 DIAGNOSIS — E039 Hypothyroidism, unspecified: Secondary | ICD-10-CM

## 2019-05-14 NOTE — Telephone Encounter (Signed)
Last TSH was in November Next appointment 9/18   Ref Range & Units 60mo ago (07/18/18) 73yr ago (01/31/18) 13yr ago (11/01/17)  TSH 0.350 - 4.500 uIU/mL 6.702High   3.990 CM  3.820 R   Comment: Performed by a 3rd Generation assay with a functional sensitivity of <=0.01 uIU/mL.  Performed at [facility], [address] [place], [place] [address]   Resulting Agency  [facility]      Specimen Collected: 07/18/18 08:21 Last Resulted: 07/18/18 16:00

## 2019-05-17 ENCOUNTER — Encounter: Payer: Self-pay | Admitting: Internal Medicine

## 2019-05-18 ENCOUNTER — Other Ambulatory Visit: Payer: Self-pay

## 2019-05-18 ENCOUNTER — Inpatient Hospital Stay: Payer: 59 | Attending: Internal Medicine

## 2019-05-18 ENCOUNTER — Inpatient Hospital Stay (HOSPITAL_BASED_OUTPATIENT_CLINIC_OR_DEPARTMENT_OTHER): Payer: 59 | Admitting: Internal Medicine

## 2019-05-18 ENCOUNTER — Inpatient Hospital Stay: Payer: 59

## 2019-05-18 ENCOUNTER — Encounter: Payer: Self-pay | Admitting: Internal Medicine

## 2019-05-18 DIAGNOSIS — Z170 Estrogen receptor positive status [ER+]: Secondary | ICD-10-CM | POA: Diagnosis not present

## 2019-05-18 DIAGNOSIS — Z79899 Other long term (current) drug therapy: Secondary | ICD-10-CM | POA: Diagnosis not present

## 2019-05-18 DIAGNOSIS — E119 Type 2 diabetes mellitus without complications: Secondary | ICD-10-CM | POA: Diagnosis not present

## 2019-05-18 DIAGNOSIS — Z5111 Encounter for antineoplastic chemotherapy: Secondary | ICD-10-CM | POA: Diagnosis not present

## 2019-05-18 DIAGNOSIS — Z7984 Long term (current) use of oral hypoglycemic drugs: Secondary | ICD-10-CM | POA: Diagnosis not present

## 2019-05-18 DIAGNOSIS — C50511 Malignant neoplasm of lower-outer quadrant of right female breast: Secondary | ICD-10-CM

## 2019-05-18 DIAGNOSIS — Z803 Family history of malignant neoplasm of breast: Secondary | ICD-10-CM | POA: Diagnosis not present

## 2019-05-18 DIAGNOSIS — Z9221 Personal history of antineoplastic chemotherapy: Secondary | ICD-10-CM | POA: Diagnosis not present

## 2019-05-18 DIAGNOSIS — E7800 Pure hypercholesterolemia, unspecified: Secondary | ICD-10-CM | POA: Diagnosis not present

## 2019-05-18 DIAGNOSIS — Z7901 Long term (current) use of anticoagulants: Secondary | ICD-10-CM | POA: Diagnosis not present

## 2019-05-18 DIAGNOSIS — Z923 Personal history of irradiation: Secondary | ICD-10-CM | POA: Insufficient documentation

## 2019-05-18 DIAGNOSIS — I10 Essential (primary) hypertension: Secondary | ICD-10-CM | POA: Diagnosis not present

## 2019-05-18 DIAGNOSIS — Z9013 Acquired absence of bilateral breasts and nipples: Secondary | ICD-10-CM | POA: Diagnosis not present

## 2019-05-18 DIAGNOSIS — Z86718 Personal history of other venous thrombosis and embolism: Secondary | ICD-10-CM | POA: Diagnosis not present

## 2019-05-18 LAB — COMPREHENSIVE METABOLIC PANEL
ALT: 24 U/L (ref 0–44)
AST: 35 U/L (ref 15–41)
Albumin: 3.5 g/dL (ref 3.5–5.0)
Alkaline Phosphatase: 62 U/L (ref 38–126)
Anion gap: 11 (ref 5–15)
BUN: 16 mg/dL (ref 8–23)
CO2: 21 mmol/L — ABNORMAL LOW (ref 22–32)
Calcium: 8.9 mg/dL (ref 8.9–10.3)
Chloride: 102 mmol/L (ref 98–111)
Creatinine, Ser: 1.02 mg/dL — ABNORMAL HIGH (ref 0.44–1.00)
GFR calc Af Amer: >60 mL/min (ref >60)
GFR calc non Af Amer: 58 mL/min — ABNORMAL LOW (ref >60)
Glucose, Bld: 181 mg/dL — ABNORMAL HIGH (ref 70–99)
Potassium: 4 mmol/L (ref 3.5–5.1)
Sodium: 134 mmol/L — ABNORMAL LOW (ref 135–145)
Total Bilirubin: 0.5 mg/dL (ref 0.3–1.2)
Total Protein: 6.5 g/dL (ref 6.5–8.1)

## 2019-05-18 LAB — CBC WITH DIFFERENTIAL/PLATELET
Abs Immature Granulocytes: 0.01 10*3/uL (ref 0.00–0.07)
Basophils Absolute: 0.1 10*3/uL (ref 0.0–0.1)
Basophils Relative: 2 %
Eosinophils Absolute: 0.1 10*3/uL (ref 0.0–0.5)
Eosinophils Relative: 2 %
HCT: 33.9 % — ABNORMAL LOW (ref 36.0–46.0)
Hemoglobin: 11.6 g/dL — ABNORMAL LOW (ref 12.0–15.0)
Immature Granulocytes: 0 %
Lymphocytes Relative: 51 %
Lymphs Abs: 1.5 10*3/uL (ref 0.7–4.0)
MCH: 32.8 pg (ref 26.0–34.0)
MCHC: 34.2 g/dL (ref 30.0–36.0)
MCV: 95.8 fL (ref 80.0–100.0)
Monocytes Absolute: 0.2 10*3/uL (ref 0.1–1.0)
Monocytes Relative: 8 %
Neutro Abs: 1.1 10*3/uL — ABNORMAL LOW (ref 1.7–7.7)
Neutrophils Relative %: 37 %
Platelets: 190 10*3/uL (ref 150–400)
RBC: 3.54 MIL/uL — ABNORMAL LOW (ref 3.87–5.11)
RDW: 13 % (ref 11.5–15.5)
WBC: 2.9 10*3/uL — ABNORMAL LOW (ref 4.0–10.5)
nRBC: 0 % (ref 0.0–0.2)

## 2019-05-18 MED ORDER — FULVESTRANT 250 MG/5ML IM SOLN
500.0000 mg | Freq: Once | INTRAMUSCULAR | Status: AC
  Administered 2019-05-18: 500 mg via INTRAMUSCULAR
  Filled 2019-05-18: qty 10

## 2019-05-18 NOTE — Assessment & Plan Note (Addendum)
#  Recurrent breast cancer-ER positive PR negative ? HER-2/neu-currently on Faslodex plus abema.  June 2020 CT A/P-NEG. STABLE.   #Continue Faslodex plus Abema 100 BID; Labs today-white count low/ANC 1.1; monitor for now. STABLE. AUG 2020 tumor marker 35  # History of right upper extremity DVT:on Eliquis- STABLE.   # Elevated Blood pressure- stable.   # DISPOSITION: # Faslodex/today  # Follow up in 1 month MD/ labs (CBC/CMP/Ca 27-29) Faslodex- Dr.B

## 2019-05-18 NOTE — Progress Notes (Signed)
.[place] OFFICE PROGRESS NOTE  Patient Care Team: Towanda Malkin, MD as PCP - General (Internal Medicine) Neldon Mc, MD (General Surgery) Everlene Farrier, MD (Obstetrics and Gynecology) Noreene Filbert, MD Forest Gleason, MD (Inactive) (Unknown Physician Specialty)  Cancer Staging Carcinoma of lower-outer quadrant of right breast in female, estrogen receptor positive [facility]) Staging form: Breast, AJCC 7th Edition - Pathologic: ypT1c,ypN2a, MX - Signed by Haywood Lasso, MD on 03/10/2012    Oncology History Overview Note  # DEC 2012- RIGHT BREAST CA T2 N2 M0 tumor from biopsy.  Estrogen receptor positive, Progesterone receptor positive.  Current receptor negative by FISH 2. Neoadjuvant chemotherapy started in December of 28 with Cytoxan Adriamycin 3. Started on Taxol weekly chemotherapy. 4. Patient finished 12  cycles of Taxol chemotherapy in May of 2013.     5. Status post right modified radical mastectomy [Dr.Bowers; [place]] June of 2013, ypT1c  yp N2  MO. started also on Lerazole    7. Radiation therapy to the right breast (September of 2013).  Lymph node was positive for HER-2 receptor gene amplification of 2.22.  Will proceed with Herceptin treatment starting in September of 2013.   8.Patient has finished Herceptin (maintenance therapy) in August of 2014 8. Start patient on letrozole from November, 2013. 9. Patient started on Herceptin in September 2013.    10Patient finished 12 months of Herceptin therapy on August, 2014  # 6. Status post left side prophylactic mastectomy.  #Late MAY 2018-RECURRENCE BREAST CA- ER positive/PR negative; ?? HER-2/neu- [biopsy- proven-mediastinal lymph node; in suff for her 2 testing].  [elevated Tumor marker- CT/PET- uptake in Right Mediastinal LN; Sternum [June 2018 EBUS- Dr.Kasa]   # March 10 2017- faslodex + Abema; OCT 5th CT-PR of mediastinal LN  #August 2020-left chest wall nodule biopsy  benign --------------------------------------------    DIAGNOSIS: [ BREAST CANCER- ER/PR/HER2 NEU POS  STAGE:  4   ;GOALS: PALLIATIVE  CURRENT/MOST RECENT THERAPY - ABEMA + FASLODEX    Carcinoma of lower-outer quadrant of right breast in female, estrogen receptor positive ([place])   INTERVAL HISTORY:  Kimberly [name] 63 y.o.  female pleasant patient above history of metastatic ER PR positive HER-2/neu positive breast cancer currently on abema+ Faslodex is here for follow-up.  Patient denies any new lumps or bumps.  Appetite is good but no weight loss but no nausea no vomiting but no headaches.  No chest pain.  No swelling in the legs.   Review of Systems  Constitutional: Positive for malaise/fatigue. Negative for chills, diaphoresis, fever and weight loss.  HENT: Negative for nosebleeds and sore throat.   Eyes: Negative for double vision.  Respiratory: Negative for cough, hemoptysis, sputum production, shortness of breath and wheezing.   Cardiovascular: Negative for chest pain, palpitations, orthopnea and leg swelling.  Gastrointestinal: Positive for diarrhea. Negative for abdominal pain, blood in stool, constipation, heartburn, melena, nausea and vomiting.  Genitourinary: Negative for dysuria, frequency and urgency.  Musculoskeletal: Positive for back pain and joint pain.  Neurological: Negative for dizziness, tingling, focal weakness, weakness and headaches.  Endo/Heme/Allergies: Does not bruise/bleed easily.  Psychiatric/Behavioral: Negative for depression. The patient is not nervous/anxious and does not have insomnia.       PAST MEDICAL HISTORY :  Past Medical History:  Diagnosis Date  . Arthritis   . Breast cancer ([place])    current in between breast   . Cancer of lower-outer quadrant of female breast ([place]) 08/06/2011   RIGHT   . High cholesterol   .  History of kidney stones   . Hx of bilateral breast implants   . Hypertension   . PONV (postoperative nausea and  vomiting)   . Type II diabetes mellitus (HCC)    fasting 140-150  . Vertigo    LAST WEEK  . Vertigo 01/2017    PAST SURGICAL HISTORY :   Past Surgical History:  Procedure Laterality Date  . BREAST BIOPSY  07/2011, 2020   right  . BREAST RECONSTRUCTION  03/07/2012   Procedure: BREAST RECONSTRUCTION;  Surgeon: Crissie Reese, MD;  Location: [place];  Service: Plastics;  Laterality: Left;  BREAST RECONSTRUCTION WITH PLACEMENT OF TISSUE EXPANDER TO LEFT BREAST  . BREAST RECONSTRUCTION  02/06/2013  . CESAREAN SECTION  [id number]  . ENDOBRONCHIAL ULTRASOUND N/A 02/24/2017   Procedure: ENDOBRONCHIAL ULTRASOUND;  Surgeon: [name], MD;  Location: [facility];  Service: Cardiopulmonary;  Laterality: N/A;  . LATISSIMUS FLAP TO BREAST Right 02/06/2013   Procedure: LATISSIMUS FLAP TO RIGHT BREAST WITH IMPLANT;  Surgeon: Crissie Reese, MD;  Location: [place];  Service: Plastics;  Laterality: Right;  . MASTECTOMY  03/07/12   modified right; total left  . MODIFIED MASTECTOMY  03/07/2012   Procedure: MODIFIED MASTECTOMY;  Surgeon: Haywood Lasso, MD;  Location: [place];  Service: General;  Laterality: Right;  . PORTACATH PLACEMENT  07/2011  . SCAR REVISION  03/30/2012   Procedure: SCAR REVISION;  Surgeon: Haywood Lasso, MD;  Location: [place];  Service: General;  Laterality: Right;  CLOSURE OF MASTECTOMY INCISION  . WISDOM TOOTH EXTRACTION      FAMILY HISTORY :   Family History  Problem Relation Age of Onset  . Breast cancer Mother   . Diabetes Father     SOCIAL HISTORY:   Social History   Tobacco Use  . Smoking status: Never Smoker  . Smokeless tobacco: Never Used  Substance Use Topics  . Alcohol use: No  . Drug use: No    ALLERGIES:  is allergic to sulfa antibiotics.  MEDICATIONS:  Current Outpatient Medications  Medication Sig Dispense Refill  . abemaciclib (VERZENIO) 100 MG tablet Take 1 tablet (100 mg total) by mouth 2 (two) times daily. 56 tablet 4  . apixaban  (ELIQUIS) 5 MG TABS tablet Take 1 tablet (5 mg total) by mouth 2 (two) times daily. 60 tablet 6  . atorvastatin (LIPITOR) 20 MG tablet Take 1 tablet (20 mg total) by mouth daily. 90 tablet 3  . Calcium Carbonate-Vitamin D (CALCIUM 600 + D PO) Take 1 tablet by mouth 2 (two) times daily.    . fulvestrant (FASLODEX) 250 MG/5ML injection Inject into the muscle every 30 (thirty) days. One injection each buttock over 1-2 minutes. Warm prior to use.    Marland Kitchen glipiZIDE (GLUCOTROL XL) 10 MG 24 hr tablet Take 1 tablet (10 mg total) by mouth 2 (two) times daily. 180 tablet 3  . levothyroxine (SYNTHROID) 25 MCG tablet TAKE 1 TABLET (25 MCG TOTAL) BY MOUTH DAILY BEFORE BREAKFAST. 90 tablet 1  . lisinopril (ZESTRIL) 10 MG tablet Take 1 tablet (10 mg total) by mouth every morning. 90 tablet 3  . loperamide (IMODIUM A-D) 2 MG tablet Take 2 mg by mouth 4 (four) times daily as needed for diarrhea or loose stools.    . mupirocin ointment (BACTROBAN) 2 % Apply 1 application topically 3 (three) times daily. 22 g 0  . ondansetron (ZOFRAN) 8 MG tablet Take 1 tablet (8 mg total) by mouth every 8 (eight) hours as  needed for nausea or vomiting. 20 tablet 3  . Semaglutide,0.25 or 0.5MG/DOS, (OZEMPIC, 0.25 OR 0.5 MG/DOSE,) 2 MG/1.5ML SOPN Inject 0.25 mg into the skin once a week. 1.5 mL 5   No current facility-administered medications for this visit.     PHYSICAL EXAMINATION: ECOG PERFORMANCE STATUS: 1 - Symptomatic but completely ambulatory  BP 126/86 (BP Location: Left Arm, Patient Position: Sitting, Cuff Size: Normal)   Pulse 93   Temp (!) 97.4 F (36.3 C) (Tympanic)   Wt 174 lb (78.9 kg)   BMI 31.83 kg/m   Filed Weights   05/17/19 1625  Weight: 174 lb (78.9 kg)    Physical Exam  Constitutional: She is oriented to person, place, and time and well-developed, well-nourished, and in no distress.  She is alone.   HENT:  Head: Normocephalic and atraumatic.  Mouth/Throat: Oropharynx is clear and moist. No  oropharyngeal exudate.  Eyes: Pupils are equal, round, and reactive to light.  Neck: Normal range of motion. Neck supple.  Cardiovascular: Normal rate and regular rhythm.  Pulmonary/Chest: No respiratory distress. She has no wheezes.  Abdominal: Soft. Bowel sounds are normal. She exhibits no distension and no mass. There is no abdominal tenderness. There is no rebound and no guarding.  Musculoskeletal: Normal range of motion.        General: No tenderness or edema.     Comments: Approximately 1 cm hard nodule felt in the anterior chest wall/left parasternal.  Neurological: She is alert and oriented to person, place, and time.  Skin: Skin is warm.  Psychiatric: Affect normal.       LABORATORY DATA:  I have reviewed the data as listed    Component Value Date/Time   NA 134 (L) 05/18/2019 0912   NA 135 11/28/2014 1401   K 4.0 05/18/2019 0912   K 4.7 11/28/2014 1401   CL 102 05/18/2019 0912   CL 99 (L) 11/28/2014 1401   CO2 21 (L) 05/18/2019 0912   CO2 28 11/28/2014 1401   GLUCOSE 181 (H) 05/18/2019 0912   GLUCOSE 215 (H) 11/28/2014 1401   BUN 16 05/18/2019 0912   BUN 16 11/28/2014 1401   CREATININE 1.02 (H) 05/18/2019 0912   CREATININE 0.96 11/28/2014 1401   CALCIUM 8.9 05/18/2019 0912   CALCIUM 9.9 11/28/2014 1401   PROT 6.5 05/18/2019 0912   PROT 7.6 11/28/2014 1401   ALBUMIN 3.5 05/18/2019 0912   ALBUMIN 4.5 11/28/2014 1401   AST 35 05/18/2019 0912   AST 26 11/28/2014 1401   ALT 24 05/18/2019 0912   ALT 28 11/28/2014 1401   ALKPHOS 62 05/18/2019 0912   ALKPHOS 78 11/28/2014 1401   BILITOT 0.5 05/18/2019 0912   BILITOT 0.5 11/28/2014 1401   GFRNONAA 58 (L) 05/18/2019 0912   GFRNONAA >60 11/28/2014 1401   GFRAA >60 05/18/2019 0912   GFRAA >60 11/28/2014 1401    No results found for: SPEP, UPEP  Lab Results  Component Value Date   WBC 2.9 (L) 05/18/2019   NEUTROABS 1.1 (L) 05/18/2019   HGB 11.6 (L) 05/18/2019   HCT 33.9 (L) 05/18/2019   MCV 95.8 05/18/2019    PLT 190 05/18/2019      Chemistry      Component Value Date/Time   NA 134 (L) 05/18/2019 0912   NA 135 11/28/2014 1401   K 4.0 05/18/2019 0912   K 4.7 11/28/2014 1401   CL 102 05/18/2019 0912   CL 99 (L) 11/28/2014 1401   CO2 21 (L) 05/18/2019  0912   CO2 28 11/28/2014 1401   BUN 16 05/18/2019 0912   BUN 16 11/28/2014 1401   CREATININE 1.02 (H) 05/18/2019 0912   CREATININE 0.96 11/28/2014 1401      Component Value Date/Time   CALCIUM 8.9 05/18/2019 0912   CALCIUM 9.9 11/28/2014 1401   ALKPHOS 62 05/18/2019 0912   ALKPHOS 78 11/28/2014 1401   AST 35 05/18/2019 0912   AST 26 11/28/2014 1401   ALT 24 05/18/2019 0912   ALT 28 11/28/2014 1401   BILITOT 0.5 05/18/2019 0912   BILITOT 0.5 11/28/2014 1401       RADIOGRAPHIC STUDIES: I have personally reviewed the radiological images as listed and agreed with the findings in the report. No results found.   ASSESSMENT & PLAN:  Carcinoma of lower-outer quadrant of right breast in female, estrogen receptor positive ([place])  #Recurrent breast cancer-ER positive PR negative ? HER-2/neu-currently on Faslodex plus abema.  June 2020 CT A/P-NEG. STABLE.   #Continue Faslodex plus Abema 100 BID; Labs today-white count low/ANC 1.1; monitor for now. STABLE. AUG 2020 tumor marker 35  # History of right upper extremity DVT:on Eliquis- STABLE.   # Elevated Blood pressure- stable.   # DISPOSITION: # Faslodex/today  # Follow up in 1 month MD/ labs (CBC/CMP/Ca 27-29) Faslodex- Dr.B     No orders of the defined types were placed in this encounter.  All questions were answered. The patient knows to call the clinic with any problems, questions or concerns.      [name], MD 05/18/2019 10:03 AM

## 2019-05-19 LAB — CANCER ANTIGEN 27.29: CA 27.29: 25.5 U/mL (ref 0.0–38.6)

## 2019-06-01 ENCOUNTER — Ambulatory Visit: Payer: Self-pay

## 2019-06-01 DIAGNOSIS — Z23 Encounter for immunization: Secondary | ICD-10-CM

## 2019-06-14 ENCOUNTER — Telehealth: Payer: Self-pay

## 2019-06-14 NOTE — Telephone Encounter (Signed)
Pre-visit assessment call appempted prior to [place] appointment with Dr. Rogue Bussing on 06/15/2019. No answer / left a voicemail.

## 2019-06-15 ENCOUNTER — Other Ambulatory Visit: Payer: Self-pay

## 2019-06-15 ENCOUNTER — Inpatient Hospital Stay (HOSPITAL_BASED_OUTPATIENT_CLINIC_OR_DEPARTMENT_OTHER): Payer: 59 | Admitting: Internal Medicine

## 2019-06-15 ENCOUNTER — Inpatient Hospital Stay: Payer: 59 | Attending: Internal Medicine

## 2019-06-15 ENCOUNTER — Inpatient Hospital Stay: Payer: 59

## 2019-06-15 ENCOUNTER — Encounter: Payer: Self-pay | Admitting: Internal Medicine

## 2019-06-15 DIAGNOSIS — Z86718 Personal history of other venous thrombosis and embolism: Secondary | ICD-10-CM | POA: Insufficient documentation

## 2019-06-15 DIAGNOSIS — C771 Secondary and unspecified malignant neoplasm of intrathoracic lymph nodes: Secondary | ICD-10-CM | POA: Diagnosis not present

## 2019-06-15 DIAGNOSIS — Z923 Personal history of irradiation: Secondary | ICD-10-CM | POA: Insufficient documentation

## 2019-06-15 DIAGNOSIS — C50511 Malignant neoplasm of lower-outer quadrant of right female breast: Secondary | ICD-10-CM

## 2019-06-15 DIAGNOSIS — E119 Type 2 diabetes mellitus without complications: Secondary | ICD-10-CM | POA: Diagnosis not present

## 2019-06-15 DIAGNOSIS — Z170 Estrogen receptor positive status [ER+]: Secondary | ICD-10-CM | POA: Diagnosis not present

## 2019-06-15 DIAGNOSIS — E7800 Pure hypercholesterolemia, unspecified: Secondary | ICD-10-CM | POA: Insufficient documentation

## 2019-06-15 DIAGNOSIS — Z7984 Long term (current) use of oral hypoglycemic drugs: Secondary | ICD-10-CM | POA: Insufficient documentation

## 2019-06-15 DIAGNOSIS — Z803 Family history of malignant neoplasm of breast: Secondary | ICD-10-CM | POA: Insufficient documentation

## 2019-06-15 DIAGNOSIS — Z79899 Other long term (current) drug therapy: Secondary | ICD-10-CM | POA: Insufficient documentation

## 2019-06-15 DIAGNOSIS — I10 Essential (primary) hypertension: Secondary | ICD-10-CM | POA: Diagnosis not present

## 2019-06-15 DIAGNOSIS — Z7901 Long term (current) use of anticoagulants: Secondary | ICD-10-CM | POA: Insufficient documentation

## 2019-06-15 DIAGNOSIS — Z9011 Acquired absence of right breast and nipple: Secondary | ICD-10-CM | POA: Insufficient documentation

## 2019-06-15 LAB — COMPREHENSIVE METABOLIC PANEL
ALT: 21 U/L (ref 0–44)
AST: 28 U/L (ref 15–41)
Albumin: 3.7 g/dL (ref 3.5–5.0)
Alkaline Phosphatase: 78 U/L (ref 38–126)
Anion gap: 9 (ref 5–15)
BUN: 27 mg/dL — ABNORMAL HIGH (ref 8–23)
CO2: 21 mmol/L — ABNORMAL LOW (ref 22–32)
Calcium: 9.2 mg/dL (ref 8.9–10.3)
Chloride: 102 mmol/L (ref 98–111)
Creatinine, Ser: 1.16 mg/dL — ABNORMAL HIGH (ref 0.44–1.00)
GFR calc Af Amer: 58 mL/min — ABNORMAL LOW (ref >60)
GFR calc non Af Amer: 50 mL/min — ABNORMAL LOW (ref >60)
Glucose, Bld: 281 mg/dL — ABNORMAL HIGH (ref 70–99)
Potassium: 4.1 mmol/L (ref 3.5–5.1)
Sodium: 132 mmol/L — ABNORMAL LOW (ref 135–145)
Total Bilirubin: 0.4 mg/dL (ref 0.3–1.2)
Total Protein: 7.1 g/dL (ref 6.5–8.1)

## 2019-06-15 LAB — CBC WITH DIFFERENTIAL/PLATELET
Abs Immature Granulocytes: 0.01 10*3/uL (ref 0.00–0.07)
Basophils Absolute: 0.1 10*3/uL (ref 0.0–0.1)
Basophils Relative: 2 %
Eosinophils Absolute: 0.1 10*3/uL (ref 0.0–0.5)
Eosinophils Relative: 2 %
HCT: 34.9 % — ABNORMAL LOW (ref 36.0–46.0)
Hemoglobin: 11.9 g/dL — ABNORMAL LOW (ref 12.0–15.0)
Immature Granulocytes: 0 %
Lymphocytes Relative: 47 %
Lymphs Abs: 1.5 10*3/uL (ref 0.7–4.0)
MCH: 32.8 pg (ref 26.0–34.0)
MCHC: 34.1 g/dL (ref 30.0–36.0)
MCV: 96.1 fL (ref 80.0–100.0)
Monocytes Absolute: 0.2 10*3/uL (ref 0.1–1.0)
Monocytes Relative: 7 %
Neutro Abs: 1.3 10*3/uL — ABNORMAL LOW (ref 1.7–7.7)
Neutrophils Relative %: 42 %
Platelets: 203 10*3/uL (ref 150–400)
RBC: 3.63 MIL/uL — ABNORMAL LOW (ref 3.87–5.11)
RDW: 13.1 % (ref 11.5–15.5)
WBC: 3.1 10*3/uL — ABNORMAL LOW (ref 4.0–10.5)
nRBC: 0 % (ref 0.0–0.2)

## 2019-06-15 MED ORDER — FULVESTRANT 250 MG/5ML IM SOLN
500.0000 mg | Freq: Once | INTRAMUSCULAR | Status: AC
  Administered 2019-06-15: 500 mg via INTRAMUSCULAR
  Filled 2019-06-15: qty 10

## 2019-06-15 NOTE — Progress Notes (Signed)
.[place] OFFICE PROGRESS NOTE  Patient Care Team: Neldon Mc, MD (General Surgery) Everlene Farrier, MD (Obstetrics and Gynecology) Noreene Filbert, MD Forest Gleason, MD (Inactive) (Unknown Physician Specialty)  Cancer Staging Carcinoma of lower-outer quadrant of right breast in female, estrogen receptor positive [facility]) Staging form: Breast, AJCC 7th Edition - Pathologic: ypT1c,ypN2a, MX - Signed by Haywood Lasso, MD on 03/10/2012    Oncology History Overview Note  # DEC 2012- RIGHT BREAST CA T2 N2 M0 tumor from biopsy.  Estrogen receptor positive, Progesterone receptor positive.  Current receptor negative by FISH 2. Neoadjuvant chemotherapy started in December of 28 with Cytoxan Adriamycin 3. Started on Taxol weekly chemotherapy. 4. Patient finished 12  cycles of Taxol chemotherapy in May of 2013.     5. Status post right modified radical mastectomy [Dr.Bowers; [place]] June of 2013, ypT1c  yp N2  MO. started also on Lerazole    7. Radiation therapy to the right breast (September of 2013).  Lymph node was positive for HER-2 receptor gene amplification of 2.22.  Will proceed with Herceptin treatment starting in September of 2013.   8.Patient has finished Herceptin (maintenance therapy) in August of 2014 8. Start patient on letrozole from November, 2013. 9. Patient started on Herceptin in September 2013.    10Patient finished 12 months of Herceptin therapy on August, 2014  # 6. Status post left side prophylactic mastectomy.  #Late MAY 2018-RECURRENCE BREAST CA- ER positive/PR negative; ?? HER-2/neu- [biopsy- proven-mediastinal lymph node; in suff for her 2 testing].  [elevated Tumor marker- CT/PET- uptake in Right Mediastinal LN; Sternum [June 2018 EBUS- Dr.Kasa]   # March 10 2017- faslodex + Abema; OCT 5th CT-PR of mediastinal LN  #August 2020-left chest wall nodule biopsy benign --------------------------------------------    DIAGNOSIS: [ BREAST CANCER-  ER/PR/HER2 NEU POS  STAGE:  4   ;GOALS: PALLIATIVE  CURRENT/MOST RECENT THERAPY - ABEMA + FASLODEX    Carcinoma of lower-outer quadrant of right breast in female, estrogen receptor positive ([place])   INTERVAL HISTORY:  Kimberly [name] 63 y.o.  female pleasant patient above history of metastatic ER PR positive HER-2/neu positive breast cancer currently on abema+ Faslodex is here for follow-up.  Patient denies any headaches.she denies any nausea vomiting.  No chest pain or shortness of breath.  Review of Systems  Constitutional: Positive for malaise/fatigue. Negative for chills, diaphoresis, fever and weight loss.  HENT: Negative for nosebleeds and sore throat.   Eyes: Negative for double vision.  Respiratory: Negative for cough, hemoptysis, sputum production, shortness of breath and wheezing.   Cardiovascular: Negative for chest pain, palpitations, orthopnea and leg swelling.  Gastrointestinal: Positive for diarrhea. Negative for abdominal pain, blood in stool, constipation, heartburn, melena, nausea and vomiting.  Genitourinary: Negative for dysuria, frequency and urgency.  Musculoskeletal: Positive for back pain and joint pain.  Neurological: Negative for dizziness, tingling, focal weakness, weakness and headaches.  Endo/Heme/Allergies: Does not bruise/bleed easily.  Psychiatric/Behavioral: Negative for depression. The patient is not nervous/anxious and does not have insomnia.       PAST MEDICAL HISTORY :  Past Medical History:  Diagnosis Date  . Arthritis   . Breast cancer ([place])    current in between breast   . Cancer of lower-outer quadrant of female breast ([place]) 08/06/2011   RIGHT   . High cholesterol   . History of kidney stones   . Hx of bilateral breast implants   . Hypertension   . PONV (postoperative nausea and vomiting)   .  Type II diabetes mellitus (HCC)    fasting 140-150  . Vertigo    LAST WEEK  . Vertigo 01/2017    PAST SURGICAL HISTORY :   Past  Surgical History:  Procedure Laterality Date  . BREAST BIOPSY  07/2011, 2020   right  . BREAST RECONSTRUCTION  03/07/2012   Procedure: BREAST RECONSTRUCTION;  Surgeon: Crissie Reese, MD;  Location: [place];  Service: Plastics;  Laterality: Left;  BREAST RECONSTRUCTION WITH PLACEMENT OF TISSUE EXPANDER TO LEFT BREAST  . BREAST RECONSTRUCTION  02/06/2013  . CESAREAN SECTION  [id number]  . ENDOBRONCHIAL ULTRASOUND N/A 02/24/2017   Procedure: ENDOBRONCHIAL ULTRASOUND;  Surgeon: [name], MD;  Location: [facility];  Service: Cardiopulmonary;  Laterality: N/A;  . LATISSIMUS FLAP TO BREAST Right 02/06/2013   Procedure: LATISSIMUS FLAP TO RIGHT BREAST WITH IMPLANT;  Surgeon: Crissie Reese, MD;  Location: [place];  Service: Plastics;  Laterality: Right;  . MASTECTOMY  03/07/12   modified right; total left  . MODIFIED MASTECTOMY  03/07/2012   Procedure: MODIFIED MASTECTOMY;  Surgeon: Haywood Lasso, MD;  Location: [place];  Service: General;  Laterality: Right;  . PORTACATH PLACEMENT  07/2011  . SCAR REVISION  03/30/2012   Procedure: SCAR REVISION;  Surgeon: Haywood Lasso, MD;  Location: [place];  Service: General;  Laterality: Right;  CLOSURE OF MASTECTOMY INCISION  . WISDOM TOOTH EXTRACTION      FAMILY HISTORY :   Family History  Problem Relation Age of Onset  . Breast cancer Mother   . Diabetes Father     SOCIAL HISTORY:   Social History   Tobacco Use  . Smoking status: Never Smoker  . Smokeless tobacco: Never Used  Substance Use Topics  . Alcohol use: No  . Drug use: No    ALLERGIES:  is allergic to sulfa antibiotics.  MEDICATIONS:  Current Outpatient Medications  Medication Sig Dispense Refill  . abemaciclib (VERZENIO) 100 MG tablet Take 1 tablet (100 mg total) by mouth 2 (two) times daily. 56 tablet 4  . apixaban (ELIQUIS) 5 MG TABS tablet Take 1 tablet (5 mg total) by mouth 2 (two) times daily. 60 tablet 6  . atorvastatin (LIPITOR) 20 MG tablet Take 1 tablet (20  mg total) by mouth daily. 90 tablet 3  . Calcium Carbonate-Vitamin D (CALCIUM 600 + D PO) Take 1 tablet by mouth 2 (two) times daily.    . fulvestrant (FASLODEX) 250 MG/5ML injection Inject into the muscle every 30 (thirty) days. One injection each buttock over 1-2 minutes. Warm prior to use.    Marland Kitchen glipiZIDE (GLUCOTROL XL) 10 MG 24 hr tablet Take 1 tablet (10 mg total) by mouth 2 (two) times daily. 180 tablet 3  . levothyroxine (SYNTHROID) 25 MCG tablet TAKE 1 TABLET (25 MCG TOTAL) BY MOUTH DAILY BEFORE BREAKFAST. 90 tablet 1  . lisinopril (ZESTRIL) 10 MG tablet Take 1 tablet (10 mg total) by mouth every morning. 90 tablet 3  . loperamide (IMODIUM A-D) 2 MG tablet Take 2 mg by mouth 4 (four) times daily as needed for diarrhea or loose stools.    . mupirocin ointment (BACTROBAN) 2 % Apply 1 application topically 3 (three) times daily. 22 g 0  . ondansetron (ZOFRAN) 8 MG tablet Take 1 tablet (8 mg total) by mouth every 8 (eight) hours as needed for nausea or vomiting. 20 tablet 3  . Semaglutide,0.25 or 0.5MG/DOS, (OZEMPIC, 0.25 OR 0.5 MG/DOSE,) 2 MG/1.5ML SOPN Inject 0.25 mg into the skin  once a week. 1.5 mL 5   No current facility-administered medications for this visit.     PHYSICAL EXAMINATION: ECOG PERFORMANCE STATUS: 1 - Symptomatic but completely ambulatory  BP 122/79 (BP Location: Left Arm, Patient Position: Sitting)   Pulse 90   Temp (!) 97.3 F (36.3 C) (Tympanic)   Ht '5\' 2"'  (1.575 m)   Wt 174 lb (78.9 kg)   BMI 31.83 kg/m   Filed Weights   06/15/19 0933  Weight: 174 lb (78.9 kg)    Physical Exam  Constitutional: She is oriented to person, place, and time and well-developed, well-nourished, and in no distress.  She is alone.   HENT:  Head: Normocephalic and atraumatic.  Mouth/Throat: Oropharynx is clear and moist. No oropharyngeal exudate.  Eyes: Pupils are equal, round, and reactive to light.  Neck: Normal range of motion. Neck supple.  Cardiovascular: Normal rate and  regular rhythm.  Pulmonary/Chest: No respiratory distress. She has no wheezes.  Abdominal: Soft. Bowel sounds are normal. She exhibits no distension and no mass. There is no abdominal tenderness. There is no rebound and no guarding.  Musculoskeletal: Normal range of motion.        General: No tenderness or edema.     Comments: Approximately 1 cm hard nodule felt in the anterior chest wall/left parasternal.  Neurological: She is alert and oriented to person, place, and time.  Skin: Skin is warm.  Psychiatric: Affect normal.       LABORATORY DATA:  I have reviewed the data as listed    Component Value Date/Time   NA 132 (L) 06/15/2019 0913   NA 135 11/28/2014 1401   K 4.1 06/15/2019 0913   K 4.7 11/28/2014 1401   CL 102 06/15/2019 0913   CL 99 (L) 11/28/2014 1401   CO2 21 (L) 06/15/2019 0913   CO2 28 11/28/2014 1401   GLUCOSE 281 (H) 06/15/2019 0913   GLUCOSE 215 (H) 11/28/2014 1401   BUN 27 (H) 06/15/2019 0913   BUN 16 11/28/2014 1401   CREATININE 1.16 (H) 06/15/2019 0913   CREATININE 0.96 11/28/2014 1401   CALCIUM 9.2 06/15/2019 0913   CALCIUM 9.9 11/28/2014 1401   PROT 7.1 06/15/2019 0913   PROT 7.6 11/28/2014 1401   ALBUMIN 3.7 06/15/2019 0913   ALBUMIN 4.5 11/28/2014 1401   AST 28 06/15/2019 0913   AST 26 11/28/2014 1401   ALT 21 06/15/2019 0913   ALT 28 11/28/2014 1401   ALKPHOS 78 06/15/2019 0913   ALKPHOS 78 11/28/2014 1401   BILITOT 0.4 06/15/2019 0913   BILITOT 0.5 11/28/2014 1401   GFRNONAA 50 (L) 06/15/2019 0913   GFRNONAA >60 11/28/2014 1401   GFRAA 58 (L) 06/15/2019 0913   GFRAA >60 11/28/2014 1401    No results found for: SPEP, UPEP  Lab Results  Component Value Date   WBC 3.1 (L) 06/15/2019   NEUTROABS 1.3 (L) 06/15/2019   HGB 11.9 (L) 06/15/2019   HCT 34.9 (L) 06/15/2019   MCV 96.1 06/15/2019   PLT 203 06/15/2019      Chemistry      Component Value Date/Time   NA 132 (L) 06/15/2019 0913   NA 135 11/28/2014 1401   K 4.1 06/15/2019  0913   K 4.7 11/28/2014 1401   CL 102 06/15/2019 0913   CL 99 (L) 11/28/2014 1401   CO2 21 (L) 06/15/2019 0913   CO2 28 11/28/2014 1401   BUN 27 (H) 06/15/2019 0913   BUN 16 11/28/2014 1401  CREATININE 1.16 (H) 06/15/2019 0913   CREATININE 0.96 11/28/2014 1401      Component Value Date/Time   CALCIUM 9.2 06/15/2019 0913   CALCIUM 9.9 11/28/2014 1401   ALKPHOS 78 06/15/2019 0913   ALKPHOS 78 11/28/2014 1401   AST 28 06/15/2019 0913   AST 26 11/28/2014 1401   ALT 21 06/15/2019 0913   ALT 28 11/28/2014 1401   BILITOT 0.4 06/15/2019 0913   BILITOT 0.5 11/28/2014 1401       RADIOGRAPHIC STUDIES: I have personally reviewed the radiological images as listed and agreed with the findings in the report. No results found.   ASSESSMENT & PLAN:  Carcinoma of lower-outer quadrant of right breast in female, estrogen receptor positive ([place])  #Recurrent breast cancer-ER positive PR negative ? HER-2/neu-currently on Faslodex plus abema.  June 2020 CT A/P-NEG. STABLE.   #Continue Faslodex plus Abema 100 BID; Labs today-white count low/ANC 1.3; monitor for now.  Stable. AUG 2020 tumor marker 35. Will repeat CT scans in Dec 2020.   # History of right upper extremity DVT:on Eliquis- stable.    # Elevated Blood pressure-stable.   # DISPOSITION: # Faslodex/today  # Follow up in 1 month MD/ labs (CBC/CMP/Ca 27-29) Faslodex- Dr.B    No orders of the defined types were placed in this encounter.  All questions were answered. The patient knows to call the clinic with any problems, questions or concerns.      [name], MD 06/15/2019 12:26 PM

## 2019-06-15 NOTE — Assessment & Plan Note (Addendum)
#  Recurrent breast cancer-ER positive PR negative ? HER-2/neu-currently on Faslodex plus abema.  June 2020 CT A/P-NEG. STABLE.   #Continue Faslodex plus Abema 100 BID; Labs today-white count low/ANC 1.3; monitor for now.  Stable. AUG 2020 tumor marker 35. Will repeat CT scans in Dec 2020.   # History of right upper extremity DVT:on Eliquis- stable.    # Elevated Blood pressure-stable.   # DISPOSITION: # Faslodex/today  # Follow up in 1 month MD/ labs (CBC/CMP/Ca 27-29) Faslodex- Dr.B

## 2019-06-15 NOTE — Progress Notes (Signed)
Patient stated that she had been doing well with no complaints. 

## 2019-06-16 LAB — CANCER ANTIGEN 27.29: CA 27.29: 27.6 U/mL (ref 0.0–38.6)

## 2019-06-29 ENCOUNTER — Other Ambulatory Visit: Payer: Self-pay | Admitting: Internal Medicine

## 2019-06-29 DIAGNOSIS — Z170 Estrogen receptor positive status [ER+]: Secondary | ICD-10-CM

## 2019-06-29 DIAGNOSIS — C50511 Malignant neoplasm of lower-outer quadrant of right female breast: Secondary | ICD-10-CM

## 2019-07-12 ENCOUNTER — Ambulatory Visit: Payer: Self-pay

## 2019-07-12 ENCOUNTER — Other Ambulatory Visit: Payer: Self-pay

## 2019-07-12 ENCOUNTER — Encounter: Payer: Self-pay | Admitting: Internal Medicine

## 2019-07-12 NOTE — Progress Notes (Signed)
Pre-visit assessment completed prior to follow-up [place] clinic appointment with Dr. Rogue Bussing on 07/13/2019. No concerns at this time.

## 2019-07-13 ENCOUNTER — Inpatient Hospital Stay: Payer: 59

## 2019-07-13 ENCOUNTER — Other Ambulatory Visit: Payer: Self-pay

## 2019-07-13 ENCOUNTER — Inpatient Hospital Stay: Payer: 59 | Attending: Internal Medicine | Admitting: Internal Medicine

## 2019-07-13 DIAGNOSIS — C50511 Malignant neoplasm of lower-outer quadrant of right female breast: Secondary | ICD-10-CM

## 2019-07-13 DIAGNOSIS — Z7984 Long term (current) use of oral hypoglycemic drugs: Secondary | ICD-10-CM | POA: Insufficient documentation

## 2019-07-13 DIAGNOSIS — E119 Type 2 diabetes mellitus without complications: Secondary | ICD-10-CM | POA: Diagnosis not present

## 2019-07-13 DIAGNOSIS — Z79899 Other long term (current) drug therapy: Secondary | ICD-10-CM | POA: Insufficient documentation

## 2019-07-13 DIAGNOSIS — I10 Essential (primary) hypertension: Secondary | ICD-10-CM | POA: Insufficient documentation

## 2019-07-13 DIAGNOSIS — Z923 Personal history of irradiation: Secondary | ICD-10-CM | POA: Insufficient documentation

## 2019-07-13 DIAGNOSIS — Z7901 Long term (current) use of anticoagulants: Secondary | ICD-10-CM | POA: Insufficient documentation

## 2019-07-13 DIAGNOSIS — Z833 Family history of diabetes mellitus: Secondary | ICD-10-CM | POA: Insufficient documentation

## 2019-07-13 DIAGNOSIS — Z86718 Personal history of other venous thrombosis and embolism: Secondary | ICD-10-CM | POA: Diagnosis not present

## 2019-07-13 DIAGNOSIS — Z170 Estrogen receptor positive status [ER+]: Secondary | ICD-10-CM | POA: Insufficient documentation

## 2019-07-13 DIAGNOSIS — E7800 Pure hypercholesterolemia, unspecified: Secondary | ICD-10-CM | POA: Insufficient documentation

## 2019-07-13 DIAGNOSIS — Z9011 Acquired absence of right breast and nipple: Secondary | ICD-10-CM | POA: Diagnosis not present

## 2019-07-13 DIAGNOSIS — Z803 Family history of malignant neoplasm of breast: Secondary | ICD-10-CM | POA: Diagnosis not present

## 2019-07-13 DIAGNOSIS — Z5111 Encounter for antineoplastic chemotherapy: Secondary | ICD-10-CM | POA: Diagnosis not present

## 2019-07-13 LAB — CBC WITH DIFFERENTIAL/PLATELET
Abs Immature Granulocytes: 0.01 10*3/uL (ref 0.00–0.07)
Basophils Absolute: 0.1 10*3/uL (ref 0.0–0.1)
Basophils Relative: 2 %
Eosinophils Absolute: 0 10*3/uL (ref 0.0–0.5)
Eosinophils Relative: 2 %
HCT: 34.4 % — ABNORMAL LOW (ref 36.0–46.0)
Hemoglobin: 11.7 g/dL — ABNORMAL LOW (ref 12.0–15.0)
Immature Granulocytes: 0 %
Lymphocytes Relative: 48 %
Lymphs Abs: 1.2 10*3/uL (ref 0.7–4.0)
MCH: 33.1 pg (ref 26.0–34.0)
MCHC: 34 g/dL (ref 30.0–36.0)
MCV: 97.2 fL (ref 80.0–100.0)
Monocytes Absolute: 0.2 10*3/uL (ref 0.1–1.0)
Monocytes Relative: 8 %
Neutro Abs: 1 10*3/uL — ABNORMAL LOW (ref 1.7–7.7)
Neutrophils Relative %: 40 %
Platelets: 191 10*3/uL (ref 150–400)
RBC: 3.54 MIL/uL — ABNORMAL LOW (ref 3.87–5.11)
RDW: 13.1 % (ref 11.5–15.5)
WBC: 2.6 10*3/uL — ABNORMAL LOW (ref 4.0–10.5)
nRBC: 0 % (ref 0.0–0.2)

## 2019-07-13 LAB — COMPREHENSIVE METABOLIC PANEL
ALT: 21 U/L (ref 0–44)
AST: 33 U/L (ref 15–41)
Albumin: 3.8 g/dL (ref 3.5–5.0)
Alkaline Phosphatase: 65 U/L (ref 38–126)
Anion gap: 7 (ref 5–15)
BUN: 21 mg/dL (ref 8–23)
CO2: 23 mmol/L (ref 22–32)
Calcium: 8.9 mg/dL (ref 8.9–10.3)
Chloride: 102 mmol/L (ref 98–111)
Creatinine, Ser: 1.06 mg/dL — ABNORMAL HIGH (ref 0.44–1.00)
GFR calc Af Amer: >60 mL/min (ref >60)
GFR calc non Af Amer: 55 mL/min — ABNORMAL LOW (ref >60)
Glucose, Bld: 225 mg/dL — ABNORMAL HIGH (ref 70–99)
Potassium: 4.1 mmol/L (ref 3.5–5.1)
Sodium: 132 mmol/L — ABNORMAL LOW (ref 135–145)
Total Bilirubin: 0.6 mg/dL (ref 0.3–1.2)
Total Protein: 6.9 g/dL (ref 6.5–8.1)

## 2019-07-13 MED ORDER — FULVESTRANT 250 MG/5ML IM SOLN
500.0000 mg | Freq: Once | INTRAMUSCULAR | Status: AC
  Administered 2019-07-13: 500 mg via INTRAMUSCULAR
  Filled 2019-07-13: qty 10

## 2019-07-13 NOTE — Assessment & Plan Note (Signed)
#  Recurrent breast cancer-ER positive PR negative ? HER-2/neu-currently on Faslodex plus abema.  June 2020 CT A/P-NEG. STABLE.   #Continue Faslodex plus Abema 100 BID; Labs today-white count low/ANC 1.0; monitor for now. Will repeat CT scans in Jan 2021/sec to insurance. .   # History of right upper extremity DVT:on Eliquis- STABLE.   # DISPOSITION: # Faslodex/today  # Follow up in 1 month MD/ labs (CBC/CMP/Ca 27-29) Faslodex- Dr.B

## 2019-07-13 NOTE — Progress Notes (Signed)
.[facility] OFFICE PROGRESS NOTE  Patient Care Team: Neldon Mc, MD (General Surgery) Everlene Farrier, MD (Obstetrics and Gynecology) Noreene Filbert, MD Forest Gleason, MD (Inactive) (Unknown Physician Specialty)  Cancer Staging Carcinoma of lower-outer quadrant of right breast in female, estrogen receptor positive [facility]) Staging form: Breast, AJCC 7th Edition - Pathologic: ypT1c,ypN2a, MX - Signed by Haywood Lasso, MD on 03/10/2012    Oncology History Overview Note  # DEC 2012- RIGHT BREAST CA T2 N2 M0 tumor from biopsy.  Estrogen receptor positive, Progesterone receptor positive.  Current receptor negative by FISH 2. Neoadjuvant chemotherapy started in December of 28 with Cytoxan Adriamycin 3. Started on Taxol weekly chemotherapy. 4. Patient finished 12  cycles of Taxol chemotherapy in May of 2013.     5. Status post right modified radical mastectomy [Dr.Bowers; [place]] June of 2013, ypT1c  yp N2  MO. started also on Lerazole    7. Radiation therapy to the right breast (September of 2013).  Lymph node was positive for HER-2 receptor gene amplification of 2.22.  Will proceed with Herceptin treatment starting in September of 2013.   8.Patient has finished Herceptin (maintenance therapy) in August of 2014 8. Start patient on letrozole from November, 2013. 9. Patient started on Herceptin in September 2013.    10Patient finished 12 months of Herceptin therapy on August, 2014  # 6. Status post left side prophylactic mastectomy.  #Late MAY 2018-RECURRENCE BREAST CA- ER positive/PR negative; ?? HER-2/neu- [biopsy- proven-mediastinal lymph node; in suff for her 2 testing].  [elevated Tumor marker- CT/PET- uptake in Right Mediastinal LN; Sternum [June 2018 EBUS- Dr.Kasa]   # March 10 2017- faslodex + Abema; OCT 5th CT-PR of mediastinal LN  #August 2020-left chest wall nodule biopsy benign --------------------------------------------    DIAGNOSIS: [ BREAST CANCER-  ER/PR/HER2 NEU POS  STAGE:  4   ;GOALS: PALLIATIVE  CURRENT/MOST RECENT THERAPY - ABEMA + FASLODEX    Carcinoma of lower-outer quadrant of right breast in female, estrogen receptor positive ([place])   INTERVAL HISTORY:  Kimberly [name] 63 y.o.  female pleasant patient above history of metastatic ER PR positive HER-2/neu positive breast cancer currently on abema+ Faslodex is here for follow-up.  Patient denies any fevers or chills but denies a headache.  No nausea no vomiting.  No shortness of breath or cough.    Review of Systems  Constitutional: Positive for malaise/fatigue. Negative for chills, diaphoresis, fever and weight loss.  HENT: Negative for nosebleeds and sore throat.   Eyes: Negative for double vision.  Respiratory: Negative for cough, hemoptysis, sputum production, shortness of breath and wheezing.   Cardiovascular: Negative for chest pain, palpitations, orthopnea and leg swelling.  Gastrointestinal: Positive for diarrhea. Negative for abdominal pain, blood in stool, constipation, heartburn, melena, nausea and vomiting.  Genitourinary: Negative for dysuria, frequency and urgency.  Musculoskeletal: Positive for back pain and joint pain.  Neurological: Negative for dizziness, tingling, focal weakness, weakness and headaches.  Endo/Heme/Allergies: Does not bruise/bleed easily.  Psychiatric/Behavioral: Negative for depression. The patient is not nervous/anxious and does not have insomnia.       PAST MEDICAL HISTORY :  Past Medical History:  Diagnosis Date  . Arthritis   . Breast cancer ([place])    current in between breast   . Cancer of lower-outer quadrant of female breast ([place]) 08/06/2011   RIGHT   . High cholesterol   . History of kidney stones   . Hx of bilateral breast implants   . Hypertension   .  PONV (postoperative nausea and vomiting)   . Type II diabetes mellitus ([facility])    fasting 140-150  . Vertigo    LAST WEEK  . Vertigo 01/2017    PAST SURGICAL  HISTORY :   Past Surgical History:  Procedure Laterality Date  . BREAST BIOPSY  07/2011, 2020   right  . BREAST RECONSTRUCTION  03/07/2012   Procedure: BREAST RECONSTRUCTION;  Surgeon: Crissie Reese, MD;  Location: [facility];  Service: Plastics;  Laterality: Left;  BREAST RECONSTRUCTION WITH PLACEMENT OF TISSUE EXPANDER TO LEFT BREAST  . BREAST RECONSTRUCTION  02/06/2013  . CESAREAN SECTION  [id number]  . ENDOBRONCHIAL ULTRASOUND N/A 02/24/2017   Procedure: ENDOBRONCHIAL ULTRASOUND;  Surgeon: [name], MD;  Location: [facility];  Service: Cardiopulmonary;  Laterality: N/A;  . LATISSIMUS FLAP TO BREAST Right 02/06/2013   Procedure: LATISSIMUS FLAP TO RIGHT BREAST WITH IMPLANT;  Surgeon: Crissie Reese, MD;  Location: [facility];  Service: Plastics;  Laterality: Right;  . MASTECTOMY  03/07/12   modified right; total left  . MODIFIED MASTECTOMY  03/07/2012   Procedure: MODIFIED MASTECTOMY;  Surgeon: Haywood Lasso, MD;  Location: [facility];  Service: General;  Laterality: Right;  . PORTACATH PLACEMENT  07/2011  . SCAR REVISION  03/30/2012   Procedure: SCAR REVISION;  Surgeon: Haywood Lasso, MD;  Location: [facility];  Service: General;  Laterality: Right;  CLOSURE OF MASTECTOMY INCISION  . WISDOM TOOTH EXTRACTION      FAMILY HISTORY :   Family History  Problem Relation Age of Onset  . Breast cancer Mother   . Diabetes Father     SOCIAL HISTORY:   Social History   Tobacco Use  . Smoking status: Never Smoker  . Smokeless tobacco: Never Used  Substance Use Topics  . Alcohol use: No  . Drug use: No    ALLERGIES:  is allergic to sulfa antibiotics.  MEDICATIONS:  Current Outpatient Medications  Medication Sig Dispense Refill  . apixaban (ELIQUIS) 5 MG TABS tablet Take 1 tablet (5 mg total) by mouth 2 (two) times daily. 60 tablet 6  . atorvastatin (LIPITOR) 20 MG tablet Take 1 tablet (20 mg total) by mouth daily. 90 tablet 3  . betamethasone valerate (VALISONE) 0.1 % cream Apply  0.1 application topically as needed.    . Calcium Carbonate-Vitamin D (CALCIUM 600 + D PO) Take 1 tablet by mouth 2 (two) times daily.    . fulvestrant (FASLODEX) 250 MG/5ML injection Inject into the muscle every 30 (thirty) days. One injection each buttock over 1-2 minutes. Warm prior to use.    Marland Kitchen glipiZIDE (GLUCOTROL XL) 10 MG 24 hr tablet Take 1 tablet (10 mg total) by mouth 2 (two) times daily. 180 tablet 3  . levothyroxine (SYNTHROID) 25 MCG tablet TAKE 1 TABLET (25 MCG TOTAL) BY MOUTH DAILY BEFORE BREAKFAST. 90 tablet 1  . lisinopril (ZESTRIL) 10 MG tablet Take 1 tablet (10 mg total) by mouth every morning. 90 tablet 3  . loperamide (IMODIUM A-D) 2 MG tablet Take 2 mg by mouth 4 (four) times daily as needed for diarrhea or loose stools.    . ondansetron (ZOFRAN) 8 MG tablet Take 1 tablet (8 mg total) by mouth every 8 (eight) hours as needed for nausea or vomiting. 20 tablet 3  . Semaglutide,0.25 or 0.5MG/DOS, (OZEMPIC, 0.25 OR 0.5 MG/DOSE,) 2 MG/1.5ML SOPN Inject 0.25 mg into the skin once a week. 1.5 mL 5  . VERZENIO 100 MG tablet TAKE 1 TABLET BY  MOUTH TWICE DAILY 56 tablet 3  . mupirocin ointment (BACTROBAN) 2 % Apply 1 application topically 3 (three) times daily. (Patient not taking: Reported on 07/12/2019) 22 g 0   No current facility-administered medications for this visit.    Facility-Administered Medications Ordered in Other Visits  Medication Dose Route Frequency Provider Last Rate Last Dose  . fulvestrant (FASLODEX) injection 500 mg  500 mg Intramuscular Once [name], MD        PHYSICAL EXAMINATION: ECOG PERFORMANCE STATUS: 1 - Symptomatic but completely ambulatory  BP 139/79 (BP Location: Left Arm, Patient Position: Sitting, Cuff Size: Normal)   Pulse (!) 103   Temp (!) 96.9 F (36.1 C) (Tympanic)   Wt 178 lb (80.7 kg)   BMI 32.56 kg/m   Filed Weights   07/12/19 1142  Weight: 178 lb (80.7 kg)    Physical Exam  Constitutional: She is oriented to  person, place, and time and well-developed, well-nourished, and in no distress.  She is alone.   HENT:  Head: Normocephalic and atraumatic.  Mouth/Throat: Oropharynx is clear and moist. No oropharyngeal exudate.  Eyes: Pupils are equal, round, and reactive to light.  Neck: Normal range of motion. Neck supple.  Cardiovascular: Normal rate and regular rhythm.  Pulmonary/Chest: No respiratory distress. She has no wheezes.  Abdominal: Soft. Bowel sounds are normal. She exhibits no distension and no mass. There is no abdominal tenderness. There is no rebound and no guarding.  Musculoskeletal: Normal range of motion.        General: No tenderness or edema.     Comments: Approximately 1 cm hard nodule felt in the anterior chest wall/left parasternal.  Neurological: She is alert and oriented to person, place, and time.  Skin: Skin is warm.  Psychiatric: Affect normal.       LABORATORY DATA:  I have reviewed the data as listed    Component Value Date/Time   NA 132 (L) 07/13/2019 0915   NA 135 11/28/2014 1401   K 4.1 07/13/2019 0915   K 4.7 11/28/2014 1401   CL 102 07/13/2019 0915   CL 99 (L) 11/28/2014 1401   CO2 23 07/13/2019 0915   CO2 28 11/28/2014 1401   GLUCOSE 225 (H) 07/13/2019 0915   GLUCOSE 215 (H) 11/28/2014 1401   BUN 21 07/13/2019 0915   BUN 16 11/28/2014 1401   CREATININE 1.06 (H) 07/13/2019 0915   CREATININE 0.96 11/28/2014 1401   CALCIUM 8.9 07/13/2019 0915   CALCIUM 9.9 11/28/2014 1401   PROT 6.9 07/13/2019 0915   PROT 7.6 11/28/2014 1401   ALBUMIN 3.8 07/13/2019 0915   ALBUMIN 4.5 11/28/2014 1401   AST 33 07/13/2019 0915   AST 26 11/28/2014 1401   ALT 21 07/13/2019 0915   ALT 28 11/28/2014 1401   ALKPHOS 65 07/13/2019 0915   ALKPHOS 78 11/28/2014 1401   BILITOT 0.6 07/13/2019 0915   BILITOT 0.5 11/28/2014 1401   GFRNONAA 55 (L) 07/13/2019 0915   GFRNONAA >60 11/28/2014 1401   GFRAA >60 07/13/2019 0915   GFRAA >60 11/28/2014 1401    No results found  for: SPEP, UPEP  Lab Results  Component Value Date   WBC 2.6 (L) 07/13/2019   NEUTROABS 1.0 (L) 07/13/2019   HGB 11.7 (L) 07/13/2019   HCT 34.4 (L) 07/13/2019   MCV 97.2 07/13/2019   PLT 191 07/13/2019      Chemistry      Component Value Date/Time   NA 132 (L) 07/13/2019 0915  NA 135 11/28/2014 1401   K 4.1 07/13/2019 0915   K 4.7 11/28/2014 1401   CL 102 07/13/2019 0915   CL 99 (L) 11/28/2014 1401   CO2 23 07/13/2019 0915   CO2 28 11/28/2014 1401   BUN 21 07/13/2019 0915   BUN 16 11/28/2014 1401   CREATININE 1.06 (H) 07/13/2019 0915   CREATININE 0.96 11/28/2014 1401      Component Value Date/Time   CALCIUM 8.9 07/13/2019 0915   CALCIUM 9.9 11/28/2014 1401   ALKPHOS 65 07/13/2019 0915   ALKPHOS 78 11/28/2014 1401   AST 33 07/13/2019 0915   AST 26 11/28/2014 1401   ALT 21 07/13/2019 0915   ALT 28 11/28/2014 1401   BILITOT 0.6 07/13/2019 0915   BILITOT 0.5 11/28/2014 1401       RADIOGRAPHIC STUDIES: I have personally reviewed the radiological images as listed and agreed with the findings in the report. No results found.   ASSESSMENT & PLAN:  Carcinoma of lower-outer quadrant of right breast in female, estrogen receptor positive ([place])  #Recurrent breast cancer-ER positive PR negative ? HER-2/neu-currently on Faslodex plus abema.  June 2020 CT A/P-NEG. STABLE.   #Continue Faslodex plus Abema 100 BID; Labs today-white count low/ANC 1.0; monitor for now. Will repeat CT scans in Jan 2021/sec to insurance. .   # History of right upper extremity DVT:on Eliquis- STABLE.   # DISPOSITION: # Faslodex/today  # Follow up in 1 month MD/ labs (CBC/CMP/Ca 27-29) Faslodex- Dr.B    No orders of the defined types were placed in this encounter.  All questions were answered. The patient knows to call the clinic with any problems, questions or concerns.      [name], MD 07/13/2019 10:27 AM

## 2019-07-14 LAB — CANCER ANTIGEN 27.29: CA 27.29: 40.5 U/mL — ABNORMAL HIGH (ref 0.0–38.6)

## 2019-07-19 ENCOUNTER — Encounter: Payer: Self-pay | Admitting: Internal Medicine

## 2019-07-19 ENCOUNTER — Telehealth: Payer: Self-pay | Admitting: Internal Medicine

## 2019-07-19 ENCOUNTER — Ambulatory Visit: Payer: Self-pay | Admitting: Physician Assistant

## 2019-07-19 ENCOUNTER — Encounter: Payer: Self-pay | Admitting: Physician Assistant

## 2019-07-19 ENCOUNTER — Other Ambulatory Visit: Payer: Self-pay

## 2019-07-19 VITALS — BP 130/80 | HR 91 | Temp 98.8°F | Resp 16 | Ht 62.0 in | Wt 178.0 lb

## 2019-07-19 DIAGNOSIS — E1122 Type 2 diabetes mellitus with diabetic chronic kidney disease: Secondary | ICD-10-CM

## 2019-07-19 DIAGNOSIS — E119 Type 2 diabetes mellitus without complications: Secondary | ICD-10-CM

## 2019-07-19 NOTE — Telephone Encounter (Signed)
Spoke to patient regarding results of the tumor marker-slightly elevated; 40-we will reevaluate at next visit.  If still elevated then recommend scans sooner.  Pt in agreement.  GB

## 2019-07-19 NOTE — Progress Notes (Signed)
   Subjective:    Patient ID: Kimberly [name], female    DOB: 1955-09-15, 63 y.o.   MRN: [id number]  HPI 3 mo visit for pt known to clinic.  Stage 4 Metastatic Ca breast s/p bilateral mastectomy and radiation.Reconstruction. Recurrent,  Faslodex plus Abema. Recent tumor markers have risen,1 cm nodule left chest wall Oncology may be encouraging Biopsy/ TBD. Saw Oncology last week,deciding about next month vs after first of the year  Generally feels well and doing well. Had Halloween candy in the house and recognizes indiscretion finishing off the leftovers when fewer children made trick or treat rounds. This may be reflected in slight weight ( 172 to 178)  and A1C increases.( 7.7 to 8.8) Avows to be more careful in coming months. Using Glucatrol XL and Ozempic 0.25 %  Despite some weight gain , BP improved 04/11/19   136/93 to   11/19/20130/80  Reports has good days and some not quite so good. Spirits are good. Relaxed and pleasant .Son is in [place] in college and gets home to visit. Raking leaves with Dad this afternoon.  Review of Systems Right arm swelling waxs and wanes. Hx of 2 blood clots  (on  Eloquis), denies any discomfort very comfortable to day    Objective:   Physical Exam Vitals signs and nursing note reviewed.  Constitutional:      General: She is not in acute distress. Pulmonary:     Effort: No respiratory distress.  Musculoskeletal: Normal range of motion.     Comments: Ambulates briskly and evenly. Uses bilateral UEs without hesitation. No obvious swelling in right hand or fingers today  Neurological:     Mental Status: She is alert.  Psychiatric:        Mood and Affect: Mood normal.        Behavior: Behavior normal.   Interactive, pleasant and interested in recommendations. Good spirits and quite positive despite difficult medical picture    Assessment & Plan:   Defer intervention or medication change as she is very aware of the nature of recent weight gain  and will try to increase her walks to help resolve. Pending f/u with her Oncologist -reminded that Eloquis will need to be adjusted if there is conversation about a biopsy.   Plan 3 month f/u for wt and A1C, TSH repeat. Supported and encouraged Will probably have biopsy of left chest wall nodule after the fist of the year, plan to see her when that is behind her and plan is made.

## 2019-08-03 LAB — POCT GLYCOSYLATED HEMOGLOBIN (HGB A1C): Hemoglobin A1C: 8.8 % — AB (ref 4.0–5.6)

## 2019-08-09 ENCOUNTER — Other Ambulatory Visit: Payer: Self-pay

## 2019-08-10 ENCOUNTER — Inpatient Hospital Stay: Payer: 59

## 2019-08-10 ENCOUNTER — Inpatient Hospital Stay (HOSPITAL_BASED_OUTPATIENT_CLINIC_OR_DEPARTMENT_OTHER): Payer: 59 | Admitting: Internal Medicine

## 2019-08-10 ENCOUNTER — Inpatient Hospital Stay: Payer: 59 | Attending: Internal Medicine

## 2019-08-10 VITALS — BP 139/79 | HR 93 | Temp 97.1°F | Resp 16 | Wt 174.8 lb

## 2019-08-10 DIAGNOSIS — C50511 Malignant neoplasm of lower-outer quadrant of right female breast: Secondary | ICD-10-CM

## 2019-08-10 DIAGNOSIS — Z170 Estrogen receptor positive status [ER+]: Secondary | ICD-10-CM

## 2019-08-10 DIAGNOSIS — C771 Secondary and unspecified malignant neoplasm of intrathoracic lymph nodes: Secondary | ICD-10-CM | POA: Insufficient documentation

## 2019-08-10 DIAGNOSIS — I10 Essential (primary) hypertension: Secondary | ICD-10-CM | POA: Diagnosis not present

## 2019-08-10 DIAGNOSIS — N183 Chronic kidney disease, stage 3 unspecified: Secondary | ICD-10-CM | POA: Insufficient documentation

## 2019-08-10 DIAGNOSIS — Z79899 Other long term (current) drug therapy: Secondary | ICD-10-CM | POA: Insufficient documentation

## 2019-08-10 DIAGNOSIS — Z5111 Encounter for antineoplastic chemotherapy: Secondary | ICD-10-CM | POA: Insufficient documentation

## 2019-08-10 DIAGNOSIS — Z803 Family history of malignant neoplasm of breast: Secondary | ICD-10-CM | POA: Insufficient documentation

## 2019-08-10 DIAGNOSIS — E7800 Pure hypercholesterolemia, unspecified: Secondary | ICD-10-CM | POA: Diagnosis not present

## 2019-08-10 DIAGNOSIS — Z923 Personal history of irradiation: Secondary | ICD-10-CM | POA: Diagnosis not present

## 2019-08-10 DIAGNOSIS — Z9013 Acquired absence of bilateral breasts and nipples: Secondary | ICD-10-CM | POA: Diagnosis not present

## 2019-08-10 DIAGNOSIS — Z7984 Long term (current) use of oral hypoglycemic drugs: Secondary | ICD-10-CM | POA: Insufficient documentation

## 2019-08-10 DIAGNOSIS — Z9221 Personal history of antineoplastic chemotherapy: Secondary | ICD-10-CM | POA: Insufficient documentation

## 2019-08-10 DIAGNOSIS — Z7901 Long term (current) use of anticoagulants: Secondary | ICD-10-CM | POA: Insufficient documentation

## 2019-08-10 LAB — CBC WITH DIFFERENTIAL/PLATELET
Abs Immature Granulocytes: 0.01 10*3/uL (ref 0.00–0.07)
Basophils Absolute: 0 10*3/uL (ref 0.0–0.1)
Basophils Relative: 1 %
Eosinophils Absolute: 0.1 10*3/uL (ref 0.0–0.5)
Eosinophils Relative: 2 %
HCT: 35.2 % — ABNORMAL LOW (ref 36.0–46.0)
Hemoglobin: 11.8 g/dL — ABNORMAL LOW (ref 12.0–15.0)
Immature Granulocytes: 0 %
Lymphocytes Relative: 43 %
Lymphs Abs: 1.3 10*3/uL (ref 0.7–4.0)
MCH: 32.8 pg (ref 26.0–34.0)
MCHC: 33.5 g/dL (ref 30.0–36.0)
MCV: 97.8 fL (ref 80.0–100.0)
Monocytes Absolute: 0.2 10*3/uL (ref 0.1–1.0)
Monocytes Relative: 7 %
Neutro Abs: 1.5 10*3/uL — ABNORMAL LOW (ref 1.7–7.7)
Neutrophils Relative %: 47 %
Platelets: 220 10*3/uL (ref 150–400)
RBC: 3.6 MIL/uL — ABNORMAL LOW (ref 3.87–5.11)
RDW: 13 % (ref 11.5–15.5)
WBC: 3.1 10*3/uL — ABNORMAL LOW (ref 4.0–10.5)
nRBC: 0 % (ref 0.0–0.2)

## 2019-08-10 LAB — COMPREHENSIVE METABOLIC PANEL
ALT: 21 U/L (ref 0–44)
AST: 28 U/L (ref 15–41)
Albumin: 3.6 g/dL (ref 3.5–5.0)
Alkaline Phosphatase: 65 U/L (ref 38–126)
Anion gap: 10 (ref 5–15)
BUN: 17 mg/dL (ref 8–23)
CO2: 19 mmol/L — ABNORMAL LOW (ref 22–32)
Calcium: 9.3 mg/dL (ref 8.9–10.3)
Chloride: 106 mmol/L (ref 98–111)
Creatinine, Ser: 1.26 mg/dL — ABNORMAL HIGH (ref 0.44–1.00)
GFR calc Af Amer: 52 mL/min — ABNORMAL LOW (ref >60)
GFR calc non Af Amer: 45 mL/min — ABNORMAL LOW (ref >60)
Glucose, Bld: 228 mg/dL — ABNORMAL HIGH (ref 70–99)
Potassium: 4.2 mmol/L (ref 3.5–5.1)
Sodium: 135 mmol/L (ref 135–145)
Total Bilirubin: 0.5 mg/dL (ref 0.3–1.2)
Total Protein: 6.9 g/dL (ref 6.5–8.1)

## 2019-08-10 MED ORDER — FULVESTRANT 250 MG/5ML IM SOLN
500.0000 mg | Freq: Once | INTRAMUSCULAR | Status: AC
  Administered 2019-08-10: 500 mg via INTRAMUSCULAR
  Filled 2019-08-10: qty 10

## 2019-08-10 NOTE — Assessment & Plan Note (Addendum)
#  Recurrent breast cancer-ER positive PR negative ? HER-2/neu-currently on Faslodex plus abema.  June 2020 CT A/P-NEG.stable.       #Continue Faslodex plus Abema 100 BID; Labs today-white count low/ANC 1.5; monitor for now. Will repeat CT scans in Jan 2021; ordered today.  # History of right upper extremity DVT:on Eliquis- STABLE.    # CKD- stage III- monitor closely; monitor blood glucose; keep hydration.   # DISPOSITION: # Faslodex/today  # Follow up in 1 month MD/ labs (CBC/CMP/Ca 27-29) Faslodex; CT scan C/A/P- - Dr.B

## 2019-08-10 NOTE — Progress Notes (Signed)
.[place] OFFICE PROGRESS NOTE  Patient Care Team: Neldon Mc, MD (General Surgery) Everlene Farrier, MD (Obstetrics and Gynecology) Noreene Filbert, MD Forest Gleason, MD (Inactive) (Unknown Physician Specialty)  Cancer Staging Carcinoma of lower-outer quadrant of right breast in female, estrogen receptor positive [facility]) Staging form: Breast, AJCC 7th Edition - Pathologic: ypT1c,ypN2a, MX - Signed by Haywood Lasso, MD on 03/10/2012    Oncology History Overview Note  # DEC 2012- RIGHT BREAST CA T2 N2 M0 tumor from biopsy.  Estrogen receptor positive, Progesterone receptor positive.  Current receptor negative by FISH 2. Neoadjuvant chemotherapy started in December of 28 with Cytoxan Adriamycin 3. Started on Taxol weekly chemotherapy. 4. Patient finished 12  cycles of Taxol chemotherapy in May of 2013.     5. Status post right modified radical mastectomy [Dr.Bowers; [place]] June of 2013, ypT1c  yp N2  MO. started also on Lerazole    7. Radiation therapy to the right breast (September of 2013).  Lymph node was positive for HER-2 receptor gene amplification of 2.22.  Will proceed with Herceptin treatment starting in September of 2013.   8.Patient has finished Herceptin (maintenance therapy) in August of 2014 8. Start patient on letrozole from November, 2013. 9. Patient started on Herceptin in September 2013.    10Patient finished 12 months of Herceptin therapy on August, 2014  # 6. Status post left side prophylactic mastectomy.  #Late MAY 2018-RECURRENCE BREAST CA- ER positive/PR negative; ?? HER-2/neu- [biopsy- proven-mediastinal lymph node; in suff for her 2 testing].  [elevated Tumor marker- CT/PET- uptake in Right Mediastinal LN; Sternum [June 2018 EBUS- Dr.Kasa]   # March 10 2017- faslodex + Abema; OCT 5th CT-PR of mediastinal LN [consent]  #August 2020-left chest wall nodule biopsy benign --------------------------------------------    DIAGNOSIS: [  BREAST CANCER- ER/PR/HER2 NEU POS  STAGE:  4   ;GOALS: PALLIATIVE  CURRENT/MOST RECENT THERAPY - ABEMA + FASLODEX    Carcinoma of lower-outer quadrant of right breast in female, estrogen receptor positive ([place])   INTERVAL HISTORY:  Kimberly [name] 63 y.o.  female pleasant patient above history of metastatic ER PR positive HER-2/neu positive breast cancer currently on abema+ Faslodex is here for follow-up.  Denies any worsening shortness of breath or cough he denies any swelling in the legs.  Denies any nausea vomiting.  Chronic mild diarrhea.   Review of Systems  Constitutional: Positive for malaise/fatigue. Negative for chills, diaphoresis, fever and weight loss.  HENT: Negative for nosebleeds and sore throat.   Eyes: Negative for double vision.  Respiratory: Negative for cough, hemoptysis, sputum production, shortness of breath and wheezing.   Cardiovascular: Negative for chest pain, palpitations, orthopnea and leg swelling.  Gastrointestinal: Positive for diarrhea. Negative for abdominal pain, blood in stool, constipation, heartburn, melena, nausea and vomiting.  Genitourinary: Negative for dysuria, frequency and urgency.  Musculoskeletal: Positive for back pain and joint pain.  Neurological: Negative for dizziness, tingling, focal weakness, weakness and headaches.  Endo/Heme/Allergies: Does not bruise/bleed easily.  Psychiatric/Behavioral: Negative for depression. The patient is not nervous/anxious and does not have insomnia.       PAST MEDICAL HISTORY :  Past Medical History:  Diagnosis Date  . Arthritis   . Breast cancer ([place])    current in between breast   . Cancer of lower-outer quadrant of female breast ([place]) 08/06/2011   RIGHT   . High cholesterol   . History of kidney stones   . Hx of bilateral breast implants   .  Hypertension   . PONV (postoperative nausea and vomiting)   . Type II diabetes mellitus (HCC)    fasting 140-150  . Vertigo    LAST WEEK  .  Vertigo 01/2017    PAST SURGICAL HISTORY :   Past Surgical History:  Procedure Laterality Date  . BREAST BIOPSY  07/2011, 2020   right  . BREAST RECONSTRUCTION  03/07/2012   Procedure: BREAST RECONSTRUCTION;  Surgeon: Crissie Reese, MD;  Location: [place];  Service: Plastics;  Laterality: Left;  BREAST RECONSTRUCTION WITH PLACEMENT OF TISSUE EXPANDER TO LEFT BREAST  . BREAST RECONSTRUCTION  02/06/2013  . CESAREAN SECTION  [id number]  . ENDOBRONCHIAL ULTRASOUND N/A 02/24/2017   Procedure: ENDOBRONCHIAL ULTRASOUND;  Surgeon: [name], MD;  Location: [facility];  Service: Cardiopulmonary;  Laterality: N/A;  . LATISSIMUS FLAP TO BREAST Right 02/06/2013   Procedure: LATISSIMUS FLAP TO RIGHT BREAST WITH IMPLANT;  Surgeon: Crissie Reese, MD;  Location: [place];  Service: Plastics;  Laterality: Right;  . MASTECTOMY  03/07/12   modified right; total left  . MODIFIED MASTECTOMY  03/07/2012   Procedure: MODIFIED MASTECTOMY;  Surgeon: Haywood Lasso, MD;  Location: [place];  Service: General;  Laterality: Right;  . PORTACATH PLACEMENT  07/2011  . SCAR REVISION  03/30/2012   Procedure: SCAR REVISION;  Surgeon: Haywood Lasso, MD;  Location: [place];  Service: General;  Laterality: Right;  CLOSURE OF MASTECTOMY INCISION  . WISDOM TOOTH EXTRACTION      FAMILY HISTORY :   Family History  Problem Relation Age of Onset  . Breast cancer Mother   . Diabetes Father     SOCIAL HISTORY:   Social History   Tobacco Use  . Smoking status: Never Smoker  . Smokeless tobacco: Never Used  Substance Use Topics  . Alcohol use: No  . Drug use: No    ALLERGIES:  is allergic to sulfa antibiotics.  MEDICATIONS:  Current Outpatient Medications  Medication Sig Dispense Refill  . apixaban (ELIQUIS) 5 MG TABS tablet Take 1 tablet (5 mg total) by mouth 2 (two) times daily. 60 tablet 6  . atorvastatin (LIPITOR) 20 MG tablet Take 1 tablet (20 mg total) by mouth daily. 90 tablet 3  . betamethasone  valerate (VALISONE) 0.1 % cream Apply 0.1 application topically as needed.    . Calcium Carbonate-Vitamin D (CALCIUM 600 + D PO) Take 1 tablet by mouth 2 (two) times daily.    . fulvestrant (FASLODEX) 250 MG/5ML injection Inject into the muscle every 30 (thirty) days. One injection each buttock over 1-2 minutes. Warm prior to use.    Marland Kitchen glipiZIDE (GLUCOTROL XL) 10 MG 24 hr tablet Take 1 tablet (10 mg total) by mouth 2 (two) times daily. 180 tablet 3  . levothyroxine (SYNTHROID) 25 MCG tablet TAKE 1 TABLET (25 MCG TOTAL) BY MOUTH DAILY BEFORE BREAKFAST. 90 tablet 1  . lisinopril (ZESTRIL) 10 MG tablet Take 1 tablet (10 mg total) by mouth every morning. 90 tablet 3  . loperamide (IMODIUM A-D) 2 MG tablet Take 2 mg by mouth 4 (four) times daily as needed for diarrhea or loose stools.    . mupirocin ointment (BACTROBAN) 2 % Apply 1 application topically 3 (three) times daily. 22 g 0  . ondansetron (ZOFRAN) 8 MG tablet Take 1 tablet (8 mg total) by mouth every 8 (eight) hours as needed for nausea or vomiting. 20 tablet 3  . Semaglutide,0.25 or 0.5MG/DOS, (OZEMPIC, 0.25 OR 0.5 MG/DOSE,) 2 MG/1.5ML SOPN  Inject 0.25 mg into the skin once a week. 1.5 mL 5  . VERZENIO 100 MG tablet TAKE 1 TABLET BY MOUTH TWICE DAILY 56 tablet 3   No current facility-administered medications for this visit.    PHYSICAL EXAMINATION: ECOG PERFORMANCE STATUS: 1 - Symptomatic but completely ambulatory  BP 139/79 (BP Location: Left Arm, Patient Position: Sitting)   Pulse 93   Temp (!) 97.1 F (36.2 C) (Tympanic)   Resp 16   Wt 174 lb 12.8 oz (79.3 kg)   SpO2 97%   BMI 31.97 kg/m   Filed Weights   08/10/19 1021  Weight: 174 lb 12.8 oz (79.3 kg)    Physical Exam  Constitutional: She is oriented to person, place, and time and well-developed, well-nourished, and in no distress.  She is alone.   HENT:  Head: Normocephalic and atraumatic.  Mouth/Throat: Oropharynx is clear and moist. No oropharyngeal exudate.  Eyes:  Pupils are equal, round, and reactive to light.  Cardiovascular: Normal rate and regular rhythm.  Pulmonary/Chest: No respiratory distress. She has no wheezes.  Abdominal: Soft. Bowel sounds are normal. She exhibits no distension and no mass. There is no abdominal tenderness. There is no rebound and no guarding.  Musculoskeletal:        General: No tenderness or edema. Normal range of motion.     Cervical back: Normal range of motion and neck supple.     Comments: Approximately 1 cm hard nodule felt in the anterior chest wall/left parasternal.  Neurological: She is alert and oriented to person, place, and time.  Skin: Skin is warm.  Psychiatric: Affect normal.    LABORATORY DATA:  I have reviewed the data as listed    Component Value Date/Time   NA 135 08/10/2019 0954   NA 135 11/28/2014 1401   K 4.2 08/10/2019 0954   K 4.7 11/28/2014 1401   CL 106 08/10/2019 0954   CL 99 (L) 11/28/2014 1401   CO2 19 (L) 08/10/2019 0954   CO2 28 11/28/2014 1401   GLUCOSE 228 (H) 08/10/2019 0954   GLUCOSE 215 (H) 11/28/2014 1401   BUN 17 08/10/2019 0954   BUN 16 11/28/2014 1401   CREATININE 1.26 (H) 08/10/2019 0954   CREATININE 0.96 11/28/2014 1401   CALCIUM 9.3 08/10/2019 0954   CALCIUM 9.9 11/28/2014 1401   PROT 6.9 08/10/2019 0954   PROT 7.6 11/28/2014 1401   ALBUMIN 3.6 08/10/2019 0954   ALBUMIN 4.5 11/28/2014 1401   AST 28 08/10/2019 0954   AST 26 11/28/2014 1401   ALT 21 08/10/2019 0954   ALT 28 11/28/2014 1401   ALKPHOS 65 08/10/2019 0954   ALKPHOS 78 11/28/2014 1401   BILITOT 0.5 08/10/2019 0954   BILITOT 0.5 11/28/2014 1401   GFRNONAA 45 (L) 08/10/2019 0954   GFRNONAA >60 11/28/2014 1401   GFRAA 52 (L) 08/10/2019 0954   GFRAA >60 11/28/2014 1401    No results found for: SPEP, UPEP  Lab Results  Component Value Date   WBC 3.1 (L) 08/10/2019   NEUTROABS 1.5 (L) 08/10/2019   HGB 11.8 (L) 08/10/2019   HCT 35.2 (L) 08/10/2019   MCV 97.8 08/10/2019   PLT 220 08/10/2019       Chemistry      Component Value Date/Time   NA 135 08/10/2019 0954   NA 135 11/28/2014 1401   K 4.2 08/10/2019 0954   K 4.7 11/28/2014 1401   CL 106 08/10/2019 0954   CL 99 (L) 11/28/2014 1401  CO2 19 (L) 08/10/2019 0954   CO2 28 11/28/2014 1401   BUN 17 08/10/2019 0954   BUN 16 11/28/2014 1401   CREATININE 1.26 (H) 08/10/2019 0954   CREATININE 0.96 11/28/2014 1401      Component Value Date/Time   CALCIUM 9.3 08/10/2019 0954   CALCIUM 9.9 11/28/2014 1401   ALKPHOS 65 08/10/2019 0954   ALKPHOS 78 11/28/2014 1401   AST 28 08/10/2019 0954   AST 26 11/28/2014 1401   ALT 21 08/10/2019 0954   ALT 28 11/28/2014 1401   BILITOT 0.5 08/10/2019 0954   BILITOT 0.5 11/28/2014 1401       RADIOGRAPHIC STUDIES: I have personally reviewed the radiological images as listed and agreed with the findings in the report. No results found.   ASSESSMENT & PLAN:  Carcinoma of lower-outer quadrant of right breast in female, estrogen receptor positive ([place])  # Recurrent breast cancer-ER positive PR negative ? HER-2/neu-currently on Faslodex plus abema.  June 2020 CT A/P-NEG.stable.       #Continue Faslodex plus Abema 100 BID; Labs today-white count low/ANC 1.5; monitor for now. Will repeat CT scans in Jan 2021; ordered today.  # History of right upper extremity DVT:on Eliquis- STABLE.    # CKD- stage III- monitor closely; monitor blood glucose; keep hydration.   # DISPOSITION: # Faslodex/today  # Follow up in 1 month MD/ labs (CBC/CMP/Ca 27-29) Faslodex; CT scan C/A/P- - Dr.B  Orders Placed This Encounter  Procedures  . CT Chest W Contrast    Standing Status:   Future    Standing Expiration Date:   08/09/2020    Order Specific Question:   If indicated for the ordered procedure, I authorize the administration of contrast media per Radiology protocol    Answer:   Yes    Order Specific Question:   Preferred imaging location?    Answer:   [facility]    Order Specific  Question:   Radiology Contrast Protocol - do NOT remove file path    Answer:   [email or web address]    Order Specific Question:   ** REASON FOR EXAM (FREE TEXT)    Answer:   breast cancer  . CT ABDOMEN PELVIS W CONTRAST    Standing Status:   Future    Standing Expiration Date:   08/09/2020    Order Specific Question:   If indicated for the ordered procedure, I authorize the administration of contrast media per Radiology protocol    Answer:   Yes    Order Specific Question:   Preferred imaging location?    Answer:   [facility]    Order Specific Question:   Radiology Contrast Protocol - do NOT remove file path    Answer:   \\[email or web address]    Order Specific Question:   ** REASON FOR EXAM (FREE TEXT)    Answer:   breast cancet   All questions were answered. The patient knows to call the clinic with any problems, questions or concerns.      [name], MD 08/10/2019 11:18 AM

## 2019-08-11 LAB — CANCER ANTIGEN 27.29: CA 27.29: 23.6 U/mL (ref 0.0–38.6)

## 2019-08-13 NOTE — Progress Notes (Signed)
Hello-I wanted to inform you that- ca-27-29- is improved at 23.6. Will proceed with scans as discussed at last visit.   No new recommendations/follow-up as planned/call if any questions.  Thank you/Dr. B/MyChart message

## 2019-08-18 ENCOUNTER — Other Ambulatory Visit: Payer: Self-pay | Admitting: Internal Medicine

## 2019-08-18 DIAGNOSIS — T451X5A Adverse effect of antineoplastic and immunosuppressive drugs, initial encounter: Secondary | ICD-10-CM

## 2019-08-18 DIAGNOSIS — R110 Nausea: Secondary | ICD-10-CM

## 2019-08-20 ENCOUNTER — Emergency Department: Payer: 59

## 2019-08-20 ENCOUNTER — Other Ambulatory Visit: Payer: Self-pay | Admitting: Oncology

## 2019-08-20 ENCOUNTER — Other Ambulatory Visit: Payer: Self-pay

## 2019-08-20 ENCOUNTER — Inpatient Hospital Stay
Admission: EM | Admit: 2019-08-20 | Discharge: 2019-08-22 | DRG: 177 | Disposition: A | Discharge status: Home / Self Care | Payer: 59 | Attending: Hospitalist | Admitting: Hospitalist

## 2019-08-20 DIAGNOSIS — Z833 Family history of diabetes mellitus: Secondary | ICD-10-CM

## 2019-08-20 DIAGNOSIS — U071 COVID-19: Principal | ICD-10-CM | POA: Diagnosis present

## 2019-08-20 DIAGNOSIS — K760 Fatty (change of) liver, not elsewhere classified: Secondary | ICD-10-CM | POA: Diagnosis not present

## 2019-08-20 DIAGNOSIS — Z79890 Hormone replacement therapy (postmenopausal): Secondary | ICD-10-CM

## 2019-08-20 DIAGNOSIS — E1122 Type 2 diabetes mellitus with diabetic chronic kidney disease: Secondary | ICD-10-CM | POA: Diagnosis not present

## 2019-08-20 DIAGNOSIS — R7989 Other specified abnormal findings of blood chemistry: Secondary | ICD-10-CM | POA: Diagnosis present

## 2019-08-20 DIAGNOSIS — C50919 Malignant neoplasm of unspecified site of unspecified female breast: Secondary | ICD-10-CM | POA: Diagnosis not present

## 2019-08-20 DIAGNOSIS — N1831 Chronic kidney disease, stage 3a: Secondary | ICD-10-CM | POA: Diagnosis present

## 2019-08-20 DIAGNOSIS — R918 Other nonspecific abnormal finding of lung field: Secondary | ICD-10-CM | POA: Diagnosis not present

## 2019-08-20 DIAGNOSIS — I129 Hypertensive chronic kidney disease with stage 1 through stage 4 chronic kidney disease, or unspecified chronic kidney disease: Secondary | ICD-10-CM | POA: Diagnosis not present

## 2019-08-20 DIAGNOSIS — K8020 Calculus of gallbladder without cholecystitis without obstruction: Secondary | ICD-10-CM | POA: Diagnosis not present

## 2019-08-20 DIAGNOSIS — K2970 Gastritis, unspecified, without bleeding: Secondary | ICD-10-CM | POA: Diagnosis present

## 2019-08-20 DIAGNOSIS — R1110 Vomiting, unspecified: Secondary | ICD-10-CM

## 2019-08-20 DIAGNOSIS — R079 Chest pain, unspecified: Secondary | ICD-10-CM

## 2019-08-20 DIAGNOSIS — E039 Hypothyroidism, unspecified: Secondary | ICD-10-CM | POA: Diagnosis not present

## 2019-08-20 DIAGNOSIS — J9811 Atelectasis: Secondary | ICD-10-CM | POA: Diagnosis present

## 2019-08-20 DIAGNOSIS — E119 Type 2 diabetes mellitus without complications: Secondary | ICD-10-CM | POA: Diagnosis not present

## 2019-08-20 DIAGNOSIS — Z7901 Long term (current) use of anticoagulants: Secondary | ICD-10-CM

## 2019-08-20 DIAGNOSIS — Z803 Family history of malignant neoplasm of breast: Secondary | ICD-10-CM

## 2019-08-20 DIAGNOSIS — J9601 Acute respiratory failure with hypoxia: Secondary | ICD-10-CM | POA: Diagnosis not present

## 2019-08-20 DIAGNOSIS — Z86718 Personal history of other venous thrombosis and embolism: Secondary | ICD-10-CM

## 2019-08-20 DIAGNOSIS — C799 Secondary malignant neoplasm of unspecified site: Secondary | ICD-10-CM | POA: Diagnosis present

## 2019-08-20 DIAGNOSIS — R112 Nausea with vomiting, unspecified: Secondary | ICD-10-CM | POA: Diagnosis not present

## 2019-08-20 DIAGNOSIS — A0839 Other viral enteritis: Secondary | ICD-10-CM | POA: Diagnosis not present

## 2019-08-20 DIAGNOSIS — R7401 Elevation of levels of liver transaminase levels: Secondary | ICD-10-CM | POA: Diagnosis present

## 2019-08-20 DIAGNOSIS — E785 Hyperlipidemia, unspecified: Secondary | ICD-10-CM | POA: Diagnosis present

## 2019-08-20 DIAGNOSIS — Z853 Personal history of malignant neoplasm of breast: Secondary | ICD-10-CM

## 2019-08-20 DIAGNOSIS — Z79899 Other long term (current) drug therapy: Secondary | ICD-10-CM

## 2019-08-20 DIAGNOSIS — Z794 Long term (current) use of insulin: Secondary | ICD-10-CM | POA: Diagnosis not present

## 2019-08-20 LAB — CBC
HCT: 33.9 % — ABNORMAL LOW (ref 36.0–46.0)
Hemoglobin: 12 g/dL (ref 12.0–15.0)
MCH: 33 pg (ref 26.0–34.0)
MCHC: 35.4 g/dL (ref 30.0–36.0)
MCV: 93.1 fL (ref 80.0–100.0)
Platelets: 245 10*3/uL (ref 150–400)
RBC: 3.64 MIL/uL — ABNORMAL LOW (ref 3.87–5.11)
RDW: 12.6 % (ref 11.5–15.5)
WBC: 3.9 10*3/uL — ABNORMAL LOW (ref 4.0–10.5)
nRBC: 0 % (ref 0.0–0.2)

## 2019-08-20 LAB — COMPREHENSIVE METABOLIC PANEL
ALT: 58 U/L — ABNORMAL HIGH (ref 0–44)
AST: 129 U/L — ABNORMAL HIGH (ref 15–41)
Albumin: 3.5 g/dL (ref 3.5–5.0)
Alkaline Phosphatase: 77 U/L (ref 38–126)
Anion gap: 16 — ABNORMAL HIGH (ref 5–15)
BUN: 11 mg/dL (ref 8–23)
CO2: 18 mmol/L — ABNORMAL LOW (ref 22–32)
Calcium: 9.8 mg/dL (ref 8.9–10.3)
Chloride: 98 mmol/L (ref 98–111)
Creatinine, Ser: 0.9 mg/dL (ref 0.44–1.00)
GFR calc Af Amer: >60 mL/min (ref >60)
GFR calc non Af Amer: >60 mL/min (ref >60)
Glucose, Bld: 279 mg/dL — ABNORMAL HIGH (ref 70–99)
Potassium: 3.7 mmol/L (ref 3.5–5.1)
Sodium: 132 mmol/L — ABNORMAL LOW (ref 135–145)
Total Bilirubin: 1.4 mg/dL — ABNORMAL HIGH (ref 0.3–1.2)
Total Protein: 6.8 g/dL (ref 6.5–8.1)

## 2019-08-20 LAB — TROPONIN I (HIGH SENSITIVITY): Troponin I (High Sensitivity): 6 ng/L (ref <18)

## 2019-08-20 LAB — LIPASE, BLOOD: Lipase: 20 U/L (ref 11–51)

## 2019-08-20 MED ORDER — ONDANSETRON HCL 4 MG/2ML IJ SOLN
4.0000 mg | Freq: Once | INTRAMUSCULAR | Status: AC
  Administered 2019-08-20: 4 mg via INTRAVENOUS
  Filled 2019-08-20: qty 2

## 2019-08-20 MED ORDER — SODIUM CHLORIDE 0.9 % IV BOLUS
1000.0000 mL | Freq: Once | INTRAVENOUS | Status: AC
  Administered 2019-08-20: 1000 mL via INTRAVENOUS

## 2019-08-20 MED ORDER — PROMETHAZINE HCL 25 MG PO TABS: 25.0000 mg | ORAL_TABLET | Freq: Four times a day (QID) | ORAL | 0 refills | Status: DC | PRN

## 2019-08-20 NOTE — ED Provider Notes (Signed)
[facility] Emergency Department Provider Note  ____________________________________________   First MD Initiated Contact with Patient 08/20/19 2301     (approximate)  I have reviewed the triage vital signs and the nursing notes.  History  Chief Complaint Chest Pain and Emesis    HPI Kimberly [name] is a 63 y.o. female with hx of breast cancer, on oral chemotherapy, DM, HTN, hx DVT on anticoagulation who presents the emergency department for chest discomfort, nausea, vomiting.  Symptoms started this morning.  She describes a central chest pain, 5/10 in severity, constant, described as a pressure.  No radiation.  No alleviating/aggravating factors.  Associated with nausea and several episodes of nonbloody, nonbilious emesis and generalized body aches.  Unable to keep anything down. No diarrhea.  No fevers.  No shortness of breath.  She reports compliance with all of her medications, including her anticoagulation.  She does report a positive COVID exposure on Friday.   Past Medical Hx Past Medical History:  Diagnosis Date  . Arthritis   . Breast cancer ([place])    current in between breast   . Cancer of lower-outer quadrant of female breast ([place]) 08/06/2011   RIGHT   . High cholesterol   . History of kidney stones   . Hx of bilateral breast implants   . Hypertension   . PONV (postoperative nausea and vomiting)   . Type II diabetes mellitus (H[facility])    fasting 140-150  . Vertigo    LAST WEEK  . Vertigo 01/2017    Problem List Patient Active Problem List   Diagnosis Date Noted  . Hypercholesterolemia 10/27/2018  . Thyroid dysfunction 10/27/2018  . Malignant tumor of breast ([place]) 10/20/2018  . Osteopenia 10/20/2018  . Pain and swelling of right upper extremity 04/11/2018  . Chest pain 02/04/2018  . Vertigo 03/08/2017  . Adenopathy   . Mediastinal adenopathy 01/25/2017  . Diabetes mellitus ([place]) 03/24/2012  . Carcinoma of lower-outer quadrant of  right breast in female, estrogen receptor positive ([place]) 08/06/2011    Past Surgical Hx Past Surgical History:  Procedure Laterality Date  . BREAST BIOPSY  07/2011, 2020   right  . BREAST RECONSTRUCTION  03/07/2012   Procedure: BREAST RECONSTRUCTION;  Surgeon: Crissie Reese, MD;  Location: [place];  Service: Plastics;  Laterality: Left;  BREAST RECONSTRUCTION WITH PLACEMENT OF TISSUE EXPANDER TO LEFT BREAST  . BREAST RECONSTRUCTION  02/06/2013  . CESAREAN SECTION  [id number]  . ENDOBRONCHIAL ULTRASOUND N/A 02/24/2017   Procedure: ENDOBRONCHIAL ULTRASOUND;  Surgeon: [name], MD;  Location: [facility];  Service: Cardiopulmonary;  Laterality: N/A;  . LATISSIMUS FLAP TO BREAST Right 02/06/2013   Procedure: LATISSIMUS FLAP TO RIGHT BREAST WITH IMPLANT;  Surgeon: Crissie Reese, MD;  Location: [place];  Service: Plastics;  Laterality: Right;  . MASTECTOMY  03/07/12   modified right; total left  . MODIFIED MASTECTOMY  03/07/2012   Procedure: MODIFIED MASTECTOMY;  Surgeon: Haywood Lasso, MD;  Location: [place];  Service: General;  Laterality: Right;  . PORTACATH PLACEMENT  07/2011  . SCAR REVISION  03/30/2012   Procedure: SCAR REVISION;  Surgeon: Haywood Lasso, MD;  Location: [place];  Service: General;  Laterality: Right;  CLOSURE OF MASTECTOMY INCISION  . WISDOM TOOTH EXTRACTION      Medications Prior to Admission medications   Medication Sig Start Date End Date Taking? Authorizing Provider  apixaban (ELIQUIS) 5 MG TABS tablet Take 1 tablet (5 mg total) by mouth 2 (two)  times daily. 02/23/19   [name], MD  atorvastatin (LIPITOR) 20 MG tablet Take 1 tablet (20 mg total) by mouth daily. 04/11/19   Towanda Malkin, MD  betamethasone valerate (VALISONE) 0.1 % cream Apply 0.1 application topically as needed. 06/08/19   [provider]  Calcium Carbonate-Vitamin D (CALCIUM 600 + D PO) Take 1 tablet by mouth 2 (two) times daily.    [provider]    fulvestrant (FASLODEX) 250 MG/5ML injection Inject into the muscle every 30 (thirty) days. One injection each buttock over 1-2 minutes. Warm prior to use.    [provider]  glipiZIDE (GLUCOTROL XL) 10 MG 24 hr tablet Take 1 tablet (10 mg total) by mouth 2 (two) times daily. 04/11/19   Towanda Malkin, MD  levothyroxine (SYNTHROID) 25 MCG tablet TAKE 1 TABLET (25 MCG TOTAL) BY MOUTH DAILY BEFORE BREAKFAST. 05/14/19   [name], MD  lisinopril (ZESTRIL) 10 MG tablet Take 1 tablet (10 mg total) by mouth every morning. 04/11/19   Towanda Malkin, MD  loperamide (IMODIUM A-D) 2 MG tablet Take 2 mg by mouth 4 (four) times daily as needed for diarrhea or loose stools.    [provider]  mupirocin ointment (BACTROBAN) 2 % Apply 1 application topically 3 (three) times daily. 02/18/19   Melynda Ripple, MD  ondansetron (ZOFRAN) 8 MG tablet TAKE 1 TABLET (8 MG TOTAL) BY MOUTH EVERY 8 (EIGHT) HOURS AS NEEDED FOR NAUSEA OR VOMITING. 08/20/19   [name], MD  promethazine (PHENERGAN) 25 MG tablet Take 1 tablet (25 mg total) by mouth every 6 (six) hours as needed for nausea or vomiting. 08/20/19   Lloyd Huger, MD  Semaglutide,0.25 or 0.5MG /DOS, (OZEMPIC, 0.25 OR 0.5 MG/DOSE,) 2 MG/1.5ML SOPN Inject 0.25 mg into the skin once a week. 04/11/19   Towanda Malkin, MD  VERZENIO 100 MG tablet TAKE 1 TABLET BY MOUTH TWICE DAILY 06/29/19   Verlon Au, NP    Allergies Sulfa antibiotics  Family Hx Family History  Problem Relation Age of Onset  . Breast cancer Mother   . Diabetes Father     Social Hx Social History   Tobacco Use  . Smoking status: Never Smoker  . Smokeless tobacco: Never Used  Substance Use Topics  . Alcohol use: No  . Drug use: No     Review of Systems  Constitutional: Negative for fever, chills. Eyes: Negative for visual changes. ENT: Negative for sore throat. Cardiovascular: + for chest  pain. Respiratory: Negative for shortness of breath. Gastrointestinal: + for nausea, vomiting.  Genitourinary: Negative for dysuria. Musculoskeletal: Negative for leg swelling. Skin: Negative for rash. Neurological: Negative for for headaches.   Physical Exam  Vital Signs: ED Triage Vitals  Enc Vitals Group     BP 08/20/19 2227 (!) 167/97     Pulse Rate 08/20/19 2227 (!) 109     Resp 08/20/19 2227 20     Temp 08/20/19 2227 98.7 F (37.1 C)     Temp Source 08/20/19 2227 Oral     SpO2 08/20/19 2227 99 %     Weight 08/20/19 2223 174 lb (78.9 kg)     Height 08/20/19 2223 5\' 2"  (1.575 m)     Head Circumference --      Peak Flow --      Pain Score 08/20/19 2223 0     Pain Loc --      Pain Edu? --  Excl. in [place]? --     Constitutional: Alert and oriented. Dry heaving during exam. Head: Normocephalic. Atraumatic. Eyes: Conjunctivae clear. Sclera anicteric. Nose: No congestion. No rhinorrhea. Mouth/Throat: Wearing mask. MM dry. Neck: No stridor.   Cardiovascular: Normal rate, regular rhythm. Extremities well perfused. Respiratory: Normal respiratory effort.  Lungs CTAB. Gastrointestinal: Soft. Non-tender. Non-distended.  Musculoskeletal: No lower extremity edema. No deformities. Neurologic:  Normal speech and language. No gross focal neurologic deficits are appreciated.  Skin: Skin is warm, dry and intact. No rash noted. Psychiatric: Mood and affect are appropriate for situation.  EKG  Personally reviewed.   Rate: 109 Rhythm: sinus Axis: LAD Intervals: prolonged QTc - 558 ms Sinus tachycardia No STEMI  Repeat EKG at 1:09 AM Rate: 111 Rhythm: sinus Axis: LAD Intervals: QTc 618 ms No STEMI    Radiology  XR: IMPRESSION:  1. Low volumes with streaky opacities likely reflecting atelectasis.  No other acute cardiopulmonary abnormality.  2. Stable postoperative changes of the chest wall and right axilla.   [place]: IMPRESSION:  1. Cholelithiasis without other  evidence of acute cholecystitis.  2. Hepatic steatosis.    Procedures  Procedure(s) performed (including critical care):  Procedures   Initial Impression / Assessment and Plan / ED Course  64 y.o. female who presents to the ED for chest pain, vomiting, body aches, as above. COVID exposure on Friday.  Ddx: COVID, other viral process, ACS, pericarditis. Less likely PE given she is compliant on anticoagulation.   Will plan for labs, fluids, imaging.   Labs reveal mildly elevated LFTs, bilirubin, will obtain US to assess for biliary etiology, but may also be in the setting of COVID.   Patient still nauseous after Zofran, unfortunately her QT on repeat EKG remains prolonged over 600, will trial Ativan. Patient desaturated while sleeping to 86%, improves when awakens, will place on 2 L [place].   COVID positive. Updated patient on results. Will plan to admit for further symptom control (particularly, her nausea with prolonged QTc), rehydration as she remains tachycardic, especially given her high risk medical history. She is agreeable w/ plan.  Discussed with hospitalist for admission.   Final Clinical Impression(s) / ED Diagnosis  Final diagnoses:  Chest pain in adult  Vomiting in adult  Vomiting  COVID-19       Note:  This document was prepared using Dragon voice recognition software and may include unintentional dictation errors.   Lilia Pro., MD 08/21/19 [phone]

## 2019-08-20 NOTE — ED Notes (Signed)
Pt stating she is still very nauseas. MD made aware.

## 2019-08-20 NOTE — ED Triage Notes (Signed)
Pt brought in by EMS from home with chest tightness since this am. Pt also started vomiting today, denies any diarrhea. Did take a phenergan at home without relief. EMS gave 4 mg zofran an 2200, pt vomited en enroute x2. Pt is on oral chemo for breast cancer.

## 2019-08-21 ENCOUNTER — Other Ambulatory Visit: Payer: Self-pay

## 2019-08-21 ENCOUNTER — Emergency Department: Payer: 59

## 2019-08-21 ENCOUNTER — Encounter: Payer: Self-pay | Admitting: Family Medicine

## 2019-08-21 DIAGNOSIS — U071 COVID-19: Secondary | ICD-10-CM | POA: Diagnosis present

## 2019-08-21 DIAGNOSIS — Z7901 Long term (current) use of anticoagulants: Secondary | ICD-10-CM | POA: Diagnosis not present

## 2019-08-21 DIAGNOSIS — E119 Type 2 diabetes mellitus without complications: Secondary | ICD-10-CM

## 2019-08-21 DIAGNOSIS — Z794 Long term (current) use of insulin: Secondary | ICD-10-CM

## 2019-08-21 DIAGNOSIS — R1110 Vomiting, unspecified: Secondary | ICD-10-CM | POA: Diagnosis present

## 2019-08-21 DIAGNOSIS — C799 Secondary malignant neoplasm of unspecified site: Secondary | ICD-10-CM | POA: Diagnosis present

## 2019-08-21 DIAGNOSIS — C50919 Malignant neoplasm of unspecified site of unspecified female breast: Secondary | ICD-10-CM | POA: Diagnosis present

## 2019-08-21 DIAGNOSIS — K8020 Calculus of gallbladder without cholecystitis without obstruction: Secondary | ICD-10-CM | POA: Diagnosis present

## 2019-08-21 DIAGNOSIS — E785 Hyperlipidemia, unspecified: Secondary | ICD-10-CM | POA: Diagnosis present

## 2019-08-21 DIAGNOSIS — E039 Hypothyroidism, unspecified: Secondary | ICD-10-CM

## 2019-08-21 DIAGNOSIS — A0839 Other viral enteritis: Secondary | ICD-10-CM | POA: Diagnosis not present

## 2019-08-21 DIAGNOSIS — E1122 Type 2 diabetes mellitus with diabetic chronic kidney disease: Secondary | ICD-10-CM | POA: Diagnosis present

## 2019-08-21 DIAGNOSIS — I129 Hypertensive chronic kidney disease with stage 1 through stage 4 chronic kidney disease, or unspecified chronic kidney disease: Secondary | ICD-10-CM | POA: Diagnosis present

## 2019-08-21 DIAGNOSIS — Z833 Family history of diabetes mellitus: Secondary | ICD-10-CM | POA: Diagnosis not present

## 2019-08-21 DIAGNOSIS — Z853 Personal history of malignant neoplasm of breast: Secondary | ICD-10-CM | POA: Diagnosis not present

## 2019-08-21 DIAGNOSIS — J9811 Atelectasis: Secondary | ICD-10-CM | POA: Diagnosis present

## 2019-08-21 DIAGNOSIS — N1831 Chronic kidney disease, stage 3a: Secondary | ICD-10-CM | POA: Diagnosis present

## 2019-08-21 DIAGNOSIS — J9601 Acute respiratory failure with hypoxia: Secondary | ICD-10-CM | POA: Diagnosis present

## 2019-08-21 DIAGNOSIS — K2970 Gastritis, unspecified, without bleeding: Secondary | ICD-10-CM | POA: Diagnosis present

## 2019-08-21 DIAGNOSIS — R7989 Other specified abnormal findings of blood chemistry: Secondary | ICD-10-CM | POA: Diagnosis present

## 2019-08-21 DIAGNOSIS — R7401 Elevation of levels of liver transaminase levels: Secondary | ICD-10-CM | POA: Diagnosis present

## 2019-08-21 DIAGNOSIS — Z803 Family history of malignant neoplasm of breast: Secondary | ICD-10-CM | POA: Diagnosis not present

## 2019-08-21 LAB — GLUCOSE, CAPILLARY
Glucose-Capillary: 212 mg/dL — ABNORMAL HIGH (ref 70–99)
Glucose-Capillary: 173 mg/dL — ABNORMAL HIGH (ref 70–99)
Glucose-Capillary: 141 mg/dL — ABNORMAL HIGH (ref 70–99)
Glucose-Capillary: 213 mg/dL — ABNORMAL HIGH (ref 70–99)

## 2019-08-21 LAB — COMPREHENSIVE METABOLIC PANEL
ALT: 58 U/L — ABNORMAL HIGH (ref 0–44)
AST: 95 U/L — ABNORMAL HIGH (ref 15–41)
Albumin: 3.5 g/dL (ref 3.5–5.0)
Alkaline Phosphatase: 74 U/L (ref 38–126)
Anion gap: 12 (ref 5–15)
BUN: 11 mg/dL (ref 8–23)
CO2: 24 mmol/L (ref 22–32)
Calcium: 9.5 mg/dL (ref 8.9–10.3)
Chloride: 98 mmol/L (ref 98–111)
Creatinine, Ser: 0.96 mg/dL (ref 0.44–1.00)
GFR calc Af Amer: >60 mL/min (ref >60)
GFR calc non Af Amer: >60 mL/min (ref >60)
Glucose, Bld: 282 mg/dL — ABNORMAL HIGH (ref 70–99)
Potassium: 3.7 mmol/L (ref 3.5–5.1)
Sodium: 134 mmol/L — ABNORMAL LOW (ref 135–145)
Total Bilirubin: 0.9 mg/dL (ref 0.3–1.2)
Total Protein: 7 g/dL (ref 6.5–8.1)

## 2019-08-21 LAB — C-REACTIVE PROTEIN: CRP: <0.5 mg/dL (ref <1.0)

## 2019-08-21 LAB — LACTATE DEHYDROGENASE: LDH: 211 U/L — ABNORMAL HIGH (ref 98–192)

## 2019-08-21 LAB — FIBRIN DERIVATIVES D-DIMER (ARMC ONLY): Fibrin derivatives D-dimer (ARMC): 631.15 ng/mL (FEU) — ABNORMAL HIGH (ref 0.00–499.00)

## 2019-08-21 LAB — RESPIRATORY PANEL BY RT PCR (FLU A&B, COVID)
Influenza A by PCR: NEGATIVE
Influenza B by PCR: NEGATIVE
SARS Coronavirus 2 by RT PCR: POSITIVE — AB

## 2019-08-21 LAB — TROPONIN I (HIGH SENSITIVITY): Troponin I (High Sensitivity): 8 ng/L (ref <18)

## 2019-08-21 LAB — FERRITIN: Ferritin: 359 ng/mL — ABNORMAL HIGH (ref 11–307)

## 2019-08-21 LAB — APTT: aPTT: 25 seconds (ref 24–36)

## 2019-08-21 MED ORDER — ZINC SULFATE 220 (50 ZN) MG PO CAPS
220.0000 mg | ORAL_CAPSULE | Freq: Every day | ORAL | Status: DC
  Administered 2019-08-21 – 2019-08-22 (×2): 220 mg via ORAL
  Filled 2019-08-21 (×2): qty 1

## 2019-08-21 MED ORDER — TRAZODONE HCL 50 MG PO TABS: 25.0000 mg | ORAL_TABLET | Freq: Every evening | ORAL | Status: DC | PRN

## 2019-08-21 MED ORDER — ABEMACICLIB 100 MG PO TABS
100.0000 mg | ORAL_TABLET | Freq: Two times a day (BID) | ORAL | Status: DC
  Administered 2019-08-21 – 2019-08-22 (×3): 100 mg via ORAL
  Filled 2019-08-21 (×5): qty 1

## 2019-08-21 MED ORDER — ACETAMINOPHEN 325 MG PO TABS: 650.0000 mg | ORAL_TABLET | Freq: Four times a day (QID) | ORAL | Status: DC | PRN

## 2019-08-21 MED ORDER — FAMOTIDINE 20 MG PO TABS
20.0000 mg | ORAL_TABLET | Freq: Two times a day (BID) | ORAL | Status: DC
  Administered 2019-08-21: 20 mg via ORAL
  Filled 2019-08-21: qty 1

## 2019-08-21 MED ORDER — APIXABAN 5 MG PO TABS
5.0000 mg | ORAL_TABLET | Freq: Two times a day (BID) | ORAL | Status: DC
  Administered 2019-08-21 – 2019-08-22 (×3): 5 mg via ORAL
  Filled 2019-08-21 (×3): qty 1

## 2019-08-21 MED ORDER — INSULIN ASPART 100 UNIT/ML ~~LOC~~ SOLN
0.0000 [IU] | Freq: Three times a day (TID) | SUBCUTANEOUS | Status: DC
  Administered 2019-08-21: 2 [IU] via SUBCUTANEOUS
  Administered 2019-08-21: 3 [IU] via SUBCUTANEOUS
  Administered 2019-08-21: 1 [IU] via SUBCUTANEOUS
  Administered 2019-08-22: 3 [IU] via SUBCUTANEOUS
  Administered 2019-08-22: 2 [IU] via SUBCUTANEOUS
  Filled 2019-08-21 (×5): qty 1

## 2019-08-21 MED ORDER — SODIUM CHLORIDE 0.9 % IV BOLUS
1000.0000 mL | Freq: Once | INTRAVENOUS | Status: AC
  Administered 2019-08-21: 1000 mL via INTRAVENOUS

## 2019-08-21 MED ORDER — MAGNESIUM HYDROXIDE 400 MG/5ML PO SUSP: 30.0000 mL | Freq: Every day | ORAL | Status: DC | PRN

## 2019-08-21 MED ORDER — ASPIRIN EC 81 MG PO TBEC
81.0000 mg | DELAYED_RELEASE_TABLET | Freq: Every day | ORAL | Status: DC
  Administered 2019-08-22: 81 mg via ORAL
  Filled 2019-08-21 (×2): qty 1

## 2019-08-21 MED ORDER — PANTOPRAZOLE SODIUM 40 MG IV SOLR
40.0000 mg | Freq: Two times a day (BID) | INTRAVENOUS | Status: DC
  Administered 2019-08-21: 40 mg via INTRAVENOUS
  Filled 2019-08-21: qty 40

## 2019-08-21 MED ORDER — PROMETHAZINE HCL 25 MG PO TABS
25.0000 mg | ORAL_TABLET | Freq: Four times a day (QID) | ORAL | Status: DC | PRN
  Filled 2019-08-21: qty 1

## 2019-08-21 MED ORDER — METOCLOPRAMIDE HCL 5 MG/ML IJ SOLN: 10.0000 mg | Freq: Four times a day (QID) | INTRAMUSCULAR | Status: DC | PRN

## 2019-08-21 MED ORDER — LORAZEPAM 2 MG/ML IJ SOLN
1.0000 mg | Freq: Once | INTRAMUSCULAR | Status: AC
  Administered 2019-08-21: 1 mg via INTRAVENOUS
  Filled 2019-08-21: qty 1

## 2019-08-21 MED ORDER — APIXABAN 5 MG PO TABS: 5.0000 mg | ORAL_TABLET | Freq: Two times a day (BID) | ORAL | Status: DC

## 2019-08-21 MED ORDER — LISINOPRIL 10 MG PO TABS
10.0000 mg | ORAL_TABLET | ORAL | Status: DC
  Administered 2019-08-21 – 2019-08-22 (×2): 10 mg via ORAL
  Filled 2019-08-21 (×3): qty 1

## 2019-08-21 MED ORDER — ONDANSETRON HCL 4 MG PO TABS: 4.0000 mg | ORAL_TABLET | Freq: Four times a day (QID) | ORAL | Status: DC | PRN

## 2019-08-21 MED ORDER — FAMOTIDINE 20 MG PO TABS: 20.0000 mg | ORAL_TABLET | Freq: Two times a day (BID) | ORAL | Status: DC | PRN

## 2019-08-21 MED ORDER — ASCORBIC ACID 500 MG PO TABS
1000.0000 mg | ORAL_TABLET | Freq: Every day | ORAL | Status: DC
  Administered 2019-08-21 – 2019-08-22 (×2): 1000 mg via ORAL
  Filled 2019-08-21 (×2): qty 2

## 2019-08-21 MED ORDER — SODIUM CHLORIDE 0.9 % IV SOLN
100.0000 mg | Freq: Every day | INTRAVENOUS | Status: DC
  Administered 2019-08-22: 100 mg via INTRAVENOUS
  Filled 2019-08-21: qty 100

## 2019-08-21 MED ORDER — VITAMIN D 25 MCG (1000 UNIT) PO TABS
1000.0000 [IU] | ORAL_TABLET | Freq: Every day | ORAL | Status: DC
  Administered 2019-08-21 – 2019-08-22 (×2): 1000 [IU] via ORAL
  Filled 2019-08-21 (×2): qty 1

## 2019-08-21 MED ORDER — LEVOTHYROXINE SODIUM 25 MCG PO TABS
25.0000 ug | ORAL_TABLET | Freq: Every day | ORAL | Status: DC
  Administered 2019-08-21 – 2019-08-22 (×2): 25 ug via ORAL
  Filled 2019-08-21 (×2): qty 1

## 2019-08-21 MED ORDER — SODIUM CHLORIDE 0.9 % IV SOLN: INTRAVENOUS | Status: DC

## 2019-08-21 MED ORDER — ENOXAPARIN SODIUM 40 MG/0.4ML ~~LOC~~ SOLN: 40.0000 mg | SUBCUTANEOUS | Status: DC

## 2019-08-21 MED ORDER — PANTOPRAZOLE SODIUM 20 MG PO TBEC
20.0000 mg | DELAYED_RELEASE_TABLET | Freq: Two times a day (BID) | ORAL | Status: DC | PRN
  Filled 2019-08-21: qty 1

## 2019-08-21 MED ORDER — ACETAMINOPHEN 650 MG RE SUPP: 650.0000 mg | Freq: Four times a day (QID) | RECTAL | Status: DC | PRN

## 2019-08-21 MED ORDER — SODIUM CHLORIDE 0.9 % IV SOLN
200.0000 mg | Freq: Once | INTRAVENOUS | Status: AC
  Administered 2019-08-21: 200 mg via INTRAVENOUS
  Filled 2019-08-21: qty 200

## 2019-08-21 MED ORDER — ONDANSETRON HCL 4 MG PO TABS: 8.0000 mg | ORAL_TABLET | Freq: Three times a day (TID) | ORAL | Status: DC | PRN

## 2019-08-21 MED ORDER — ATORVASTATIN CALCIUM 20 MG PO TABS
20.0000 mg | ORAL_TABLET | Freq: Every day | ORAL | Status: DC
  Administered 2019-08-21: 20 mg via ORAL
  Filled 2019-08-21: qty 1

## 2019-08-21 MED ORDER — ONDANSETRON HCL 4 MG/2ML IJ SOLN: 4.0000 mg | Freq: Four times a day (QID) | INTRAMUSCULAR | Status: DC | PRN

## 2019-08-21 NOTE — ED Notes (Signed)
Date and time results received: 08/21/19 2:37 AM  (use smartphrase ".now" to insert current time)  Test: COVID Critical Value: Positive  Name of Provider Notified: Dr. Joan Mayans  Orders Received? Or Actions Taken?: No new orders at this time

## 2019-08-21 NOTE — ED Notes (Addendum)
Pt currently on RA. Pt O2 sat is 98%. Pt denies SOB

## 2019-08-21 NOTE — Progress Notes (Signed)
PROGRESS NOTE    Kimberly [name]  ONG:[id number] DOB: August 03, 1956 DOA: 08/20/2019 PCP: System, Pcp Not In      Brief Narrative:  Kimberly [name] is a 63 y.o. F with metastatic BrCa s/p mastectomy on CDK inhib and Faslodex, DM, hypothyroidism, hx DVT, HTN and obesity who presented with nausea and vomiting for 1 day.    Brother in law recently tested positive for COVID and is admitted at [facility].   In the ER, heart rate 109, RR 29.  COVID+ and O2 desaturated while sleeping.  CXR clear vs "streaky opacities likely reflecting atelectasis".  Started on remdesivir and steroids.        Assessment & Plan:  COVID-19 mild -Continue remdesivir for now -Hold dexamethasone for now  Nausea vomiting -IV fluids -Anti-emetics as needed -Antacids as needed -Advance diet as tolerated   Metastatic breast cancer Limited metastatic.  On a CDK inhibitor Verzenio and also on anti-estrogen (faslodex) -Continue Abema  Hypothyroidism -Continue levothyroxine  History right upper extremity DVT -Continue apixaban  CKD IIIa Cr stable relative to baseline  Transaminitis From Abema or COVID -Trend LFTs  Hypertension Hyperlipidemia -Continue atorvastatin -Continue lisinopril  Diabetes -Hold glipizide -Start SS corrections        DVT prophylaxis: N/A on apixaban Code Status: FULL Family Communication: Husband by phone MDM and disposition Plan: This is a no charge note.  For further details, please see H&P by my partner Dr. Sidney Ace from earlier today.  The below labs and imaging reports were reviewed and summarized above.    The patient was admitted with COVID-19 and vomiting.  She has a slight O2 requirement and ongoing tachycardia, rates 110s.      Objective: Vitals:   08/21/19 1430 08/21/19 1500 08/21/19 1623 08/21/19 1624  BP: 108/61 108/63  110/82  Pulse: 97 91  (!) 102  Resp: (!) '21 20  20  ' Temp:    99.2 F (37.3 C)  TempSrc:    Oral  SpO2: 97% 92%  98%  Weight:       Height:   '5\' 2"'  (1.575 m)     Intake/Output Summary (Last 24 hours) at 08/21/2019 1636 Last data filed at 08/21/2019 1156 Gross per 24 hour  Intake 3872.92 ml  Output 350 ml  Net 3522.92 ml   Filed Weights   08/20/19 2223  Weight: 78.9 kg    Examination: The patient was seen and examined.      Data Reviewed: I have personally reviewed following labs and imaging studies:  CBC: Recent Labs  Lab 08/20/19 2239  WBC 3.9*  HGB 12.0  HCT 33.9*  MCV 93.1  PLT 440   Basic Metabolic Panel: Recent Labs  Lab 08/20/19 2239 08/21/19 0326  NA 132* 134*  K 3.7 3.7  CL 98 98  CO2 18* 24  GLUCOSE 279* 282*  BUN 11 11  CREATININE 0.90 0.96  CALCIUM 9.8 9.5   GFR: Estimated Creatinine Clearance: 57.6 mL/min (by C-G formula based on SCr of 0.96 mg/dL). Liver Function Tests: Recent Labs  Lab 08/20/19 2239 08/21/19 0326  AST 129* 95*  ALT 58* 58*  ALKPHOS 77 74  BILITOT 1.4* 0.9  PROT 6.8 7.0  ALBUMIN 3.5 3.5   Recent Labs  Lab 08/20/19 2239  LIPASE 20   No results for input(s): AMMONIA in the last 168 hours. Coagulation Profile: No results for input(s): INR, PROTIME in the last 168 hours. Cardiac Enzymes: No results for input(s): CKTOTAL, CKMB, CKMBINDEX, TROPONINI in the last  168 hours. BNP (last 3 results) No results for input(s): PROBNP in the last 8760 hours. HbA1C: No results for input(s): HGBA1C in the last 72 hours. CBG: Recent Labs  Lab 08/21/19 0747 08/21/19 1151 08/21/19 1625  GLUCAP 212* 173* 141*   Lipid Profile: No results for input(s): CHOL, HDL, LDLCALC, TRIG, CHOLHDL, LDLDIRECT in the last 72 hours. Thyroid Function Tests: No results for input(s): TSH, T4TOTAL, FREET4, T3FREE, THYROIDAB in the last 72 hours. Anemia Panel: Recent Labs    08/21/19 0326  FERRITIN 359*   Urine analysis:    Component Value Date/Time   COLORURINE YELLOW (A) 04/25/2017 1421   APPEARANCEUR CLEAR (A) 04/25/2017 1421   APPEARANCEUR CLEAR 08/14/2012  1405   LABSPEC 1.021 04/25/2017 1421   LABSPEC >=1.030 08/14/2012 1405   PHURINE 5.0 04/25/2017 1421   GLUCOSEU NEGATIVE 04/25/2017 1421   GLUCOSEU NEGATIVE 08/14/2012 1405   HGBUR NEGATIVE 04/25/2017 1421   BILIRUBINUR NEGATIVE 04/25/2017 1421   BILIRUBINUR NEGATIVE 08/14/2012 1405   KETONESUR NEGATIVE 04/25/2017 1421   PROTEINUR NEGATIVE 04/25/2017 1421   NITRITE NEGATIVE 04/25/2017 1421   LEUKOCYTESUR TRACE (A) 04/25/2017 1421   LEUKOCYTESUR NEGATIVE 08/14/2012 1405   Sepsis Labs: '@LABRCNTIP' (procalcitonin:4,lacticacidven:4)  ) Recent Results (from the past 240 hour(s))  Respiratory Panel by RT PCR (Flu A&B, Covid) - Nasopharyngeal Swab     Status: Abnormal   Collection Time: 08/21/19 12:58 AM   Specimen: Nasopharyngeal Swab  Result Value Ref Range Status   SARS Coronavirus 2 by RT PCR POSITIVE (A) NEGATIVE Final    Comment: RESULT CALLED TO, READ BACK BY AND VERIFIED WITH: Georgann Housekeeper RN [id number] 08/21/2019 HNM (NOTE) SARS-CoV-2 target nucleic acids are DETECTED. SARS-CoV-2 RNA is generally detectable in upper respiratory specimens  during the acute phase of infection. Positive results are indicative of the presence of the identified virus, but do not rule out bacterial infection or co-infection with other pathogens not detected by the test. Clinical correlation with patient history and other diagnostic information is necessary to determine patient infection status. The expected result is Negative. Fact Sheet for Patients:  PinkCheek.be Fact Sheet for Healthcare Providers: GravelBags.it This test is not yet approved or cleared by the [place] FDA and  has been authorized for detection and/or diagnosis of SARS-CoV-2 by FDA under an Emergency Use Authorization (EUA).  This EUA will remain in effect (meaning this test can be used) f or the duration of  the COVID-19 declaration under Section 564(b)(1) of the Act,  21 U.S.C. section 360bbb-3(b)(1), unless the authorization is terminated or revoked sooner.    Influenza A by PCR NEGATIVE NEGATIVE Final   Influenza B by PCR NEGATIVE NEGATIVE Final    Comment: (NOTE) The Xpert Xpress SARS-CoV-2/FLU/RSV assay is intended as an aid in  the diagnosis of influenza from Nasopharyngeal swab specimens and  should not be used as a sole basis for treatment. Nasal washings and  aspirates are unacceptable for Xpert Xpress SARS-CoV-2/FLU/RSV  testing. Fact Sheet for Patients: PinkCheek.be Fact Sheet for Healthcare Providers: GravelBags.it This test is not yet approved or cleared by the [place] FDA and  has been authorized for detection and/or diagnosis of SARS-CoV-2 by  FDA under an Emergency Use Authorization (EUA). This EUA will remain  in effect (meaning this test can be used) for the duration of the  Covid-19 declaration under Section 564(b)(1) of the Act, 21  U.S.C. section 360bbb-3(b)(1), unless the authorization is  terminated or revoked. Performed at [facility], [address] [place], [place] [address]          Radiology Studies: DG Chest Port 1 View  Result Date: 08/21/2019 CLINICAL DATA:  Nausea EXAM: PORTABLE CHEST 1 VIEW COMPARISON:  Radiograph 02/04/2018 FINDINGS: Bilateral breast prostheses and multiple surgical clips overlying both chest wall and the right axilla. Likely related to prior reconstruction and right axillary dissection. Some low volumes with streaky opacities likely reflecting atelectasis. No consolidation, convincing features of edema, pneumothorax or effusion. The cardiomediastinal contours are unremarkable. No acute osseous or soft tissue abnormality. Degenerative changes are present in the imaged spine and shoulders. IMPRESSION: 1. Low volumes with streaky opacities likely reflecting atelectasis. No other acute cardiopulmonary abnormality. 2. Stable  postoperative changes of the chest wall and right axilla. Electronically Signed   By: Lovena Le M.D.   On: 08/21/2019 00:52   US Abdomen Limited RUQ  Result Date: 08/21/2019 CLINICAL DATA:  Vomiting with elevated LFTs EXAM: ULTRASOUND ABDOMEN LIMITED RIGHT UPPER QUADRANT COMPARISON:  None. FINDINGS: Gallbladder: Cholelithiasis measuring up to 8 mm. A negative sonographic Percell Miller sign was reported by the sonographer. No pericholecystic fluid or wall thickening. Common bile duct: Diameter: 6 mm Liver: Increased hepatic echogenicity without focal lesion. Portal vein is patent on color Doppler imaging with normal direction of blood flow towards the liver. Other: None. IMPRESSION: 1. Cholelithiasis without other evidence of acute cholecystitis. 2. Hepatic steatosis. Electronically Signed   By: Ulyses Jarred M.D.   On: 08/21/2019 00:41        Scheduled Meds: . abemaciclib  100 mg Oral BID  . apixaban  5 mg Oral BID  . vitamin C  1,000 mg Oral Daily  . aspirin EC  81 mg Oral Daily  . atorvastatin  20 mg Oral q1800  . cholecalciferol  1,000 Units Oral Daily  . insulin aspart  0-9 Units Subcutaneous TID WC  . levothyroxine  25 mcg Oral QAC breakfast  . lisinopril  10 mg Oral BH-q7a  . zinc sulfate  220 mg Oral Daily   Continuous Infusions: . [START ON 08/22/2019] remdesivir 100 mg in NS 100 mL       LOS: 0 days    Time spent: 15 minutes    Edwin Dada, MD [facility] 08/21/2019, 4:36 PM     Please page though [place] or Epic secure chat:  For password, contact charge nurse

## 2019-08-21 NOTE — ED Notes (Signed)
Report received from Annie Main and [place].

## 2019-08-21 NOTE — ED Provider Notes (Deleted)
Patient awake and alert resting comfortably at this time.  Heart rate about 100-110.  Currently waiting on controlled release diltiazem.  Currently resting quite comfortably at this time.  I have also sent a message to Dr. Rockey Situ informing him of the patient's visit.  Patient comfortable with plan to be able to go home, continue her home medications including Xarelto once rate control has been achieved   Delman Kitten, MD 08/21/19 0730

## 2019-08-21 NOTE — ED Notes (Signed)
Pt ambulated to the bathroom without assistance. 

## 2019-08-21 NOTE — Plan of Care (Signed)
  Problem: Education: Goal: Knowledge of General Education information will improve Description Including pain rating scale, medication(s)/side effects and non-pharmacologic comfort measures Outcome: Progressing   

## 2019-08-21 NOTE — ED Notes (Signed)
Pt placed on 2L [place] due to administration of Ativan and pt trying to sleep. Pt woken up and O2 sats came back up to 95%. When pt attempting to sleep again O2 sats drop to mid 80s. MD made aware.

## 2019-08-21 NOTE — Progress Notes (Signed)
Remdesivir - Pharmacy Brief Note   O:  ALT: 58 CXR:  SpO2: 99% on RA   A/P:  Remdesivir 200 mg IVPB once followed by 100 mg IVPB daily x 4 days.   Hart Robinsons, PharmD 08/21/2019 6:27 AM

## 2019-08-21 NOTE — H&P (Signed)
[facility] [place] NAME: Kimberly [name]    MR#:  [id number]  DATE OF BIRTH:  May 06, 1956  DATE OF ADMISSION:  08/20/2019  PRIMARY CARE PHYSICIAN: System, Pcp Not In   REQUESTING/REFERRING PHYSICIAN: Derrell Lolling, MD CHIEF COMPLAINT:   Chief Complaint  Patient presents with  . Chest Pain  . Emesis    HISTORY OF PRESENT ILLNESS:  Kimberly [name]  is a 63 y.o. female with a known history of breast cancer status post mastectomy, dyslipidemia, hypertension and urolithiasis, presented to the emergency room with acute onset of ntractable nausea and vomiting since yesterday afternoon, preceded by chest heaviness yesterday morning which is currently resolved however she continued to have nausea and vomiting and hands not been able to keep even fluids down.  She denied any diarrhea.  No bilious vomitus or hematemesis.  She denied any palpitations or cough or wheezing or dyspnea.  She has been exposed to her brother-in-law was recently admitted for COVID-19.  She denied any loss of taste or smell.  No fever or chills.  No dysuria, oliguria or hematuria or flank pain.  Upon presentation to the emergency room, blood pressure was 167/97 with a pulse of 109 and pulse oximetry was 99% on room air and the respiratory rate was up to 29.  While the patient was asleep her O2 sat dropped to the mid 80s and was placed on 2 L O2 by nasal cannula.  Pulse oximetry was up to 95%.  COVID-19 PCR came back positive.  LDH was 211 and ferritin was slightly elevated at 359 with high sensitive troponin I of 6 and later 8 and slightly elevated fibrin derivative D-dimer of 631 and CRP is currently pending.  Portable chest x-ray showed low lung volumes with streaky opacities likely reflecting atelectasis with no acute cardiopulmonary disease.  It showed stable postoperative changes of the chest wall and right axilla.  The patient was given 1 mg of IV Ativan and 4 mg of IV Zofran as well as 2 L of  IV normal saline bolus.  She will be admitted to a medical monitored bed for further evaluation and management.   PAST MEDICAL HISTORY:   Past Medical History:  Diagnosis Date  . Arthritis   . Breast cancer ([facility])    current in between breast   . Cancer of lower-outer quadrant of female breast ([facility]) 08/06/2011   RIGHT   . High cholesterol   . History of kidney stones   . Hx of bilateral breast implants   . Hypertension   . PONV (postoperative nausea and vomiting)   . Type II diabetes mellitus ([facility])    fasting 140-150  . Vertigo    LAST WEEK  . Vertigo 01/2017    PAST SURGICAL HISTORY:   Past Surgical History:  Procedure Laterality Date  . BREAST BIOPSY  07/2011, 2020   right  . BREAST RECONSTRUCTION  03/07/2012   Procedure: BREAST RECONSTRUCTION;  Surgeon: Crissie Reese, MD;  Location: [facility];  Service: Plastics;  Laterality: Left;  BREAST RECONSTRUCTION WITH PLACEMENT OF TISSUE EXPANDER TO LEFT BREAST  . BREAST RECONSTRUCTION  02/06/2013  . CESAREAN SECTION  [id number]  . ENDOBRONCHIAL ULTRASOUND N/A 02/24/2017   Procedure: ENDOBRONCHIAL ULTRASOUND;  Surgeon: [name], MD;  Location: [facility];  Service: Cardiopulmonary;  Laterality: N/A;  . LATISSIMUS FLAP TO BREAST Right 02/06/2013   Procedure: LATISSIMUS FLAP TO RIGHT BREAST WITH IMPLANT;  Surgeon: Crissie Reese, MD;  Location: [facility];  Service: Clinical cytogeneticist;  Laterality: Right;  . MASTECTOMY  03/07/12   modified right; total left  . MODIFIED MASTECTOMY  03/07/2012   Procedure: MODIFIED MASTECTOMY;  Surgeon: Haywood Lasso, MD;  Location: [facility];  Service: General;  Laterality: Right;  . PORTACATH PLACEMENT  07/2011  . SCAR REVISION  03/30/2012   Procedure: SCAR REVISION;  Surgeon: Haywood Lasso, MD;  Location: [facility];  Service: General;  Laterality: Right;  CLOSURE OF MASTECTOMY INCISION  . WISDOM TOOTH EXTRACTION      SOCIAL HISTORY:   Social History   Tobacco Use  . Smoking status: Never Smoker  .  Smokeless tobacco: Never Used  Substance Use Topics  . Alcohol use: No    FAMILY HISTORY:   Family History  Problem Relation Age of Onset  . Breast cancer Mother   . Diabetes Father     DRUG ALLERGIES:   Allergies  Allergen Reactions  . Sulfa Antibiotics Rash    REVIEW OF SYSTEMS:   ROS As per history of present illness. All pertinent systems were reviewed above. Constitutional,  HEENT, cardiovascular, respiratory, GI, GU, musculoskeletal, neuro, psychiatric, endocrine,  integumentary and hematologic systems were reviewed and are otherwise  negative/unremarkable except for positive findings mentioned above in the HPI.   MEDICATIONS AT HOME:   Prior to Admission medications   Medication Sig Start Date End Date Taking? Authorizing Provider  apixaban (ELIQUIS) 5 MG TABS tablet Take 1 tablet (5 mg total) by mouth 2 (two) times daily. 02/23/19  Yes [name], MD  atorvastatin (LIPITOR) 20 MG tablet Take 1 tablet (20 mg total) by mouth daily. 04/11/19  Yes Lebron Conners D, MD  betamethasone valerate (VALISONE) 0.1 % cream Apply 0.1 application topically as needed. 06/08/19  Yes [provider]  Calcium Carbonate-Vitamin D (CALCIUM 600 + D PO) Take 1 tablet by mouth 2 (two) times daily.   Yes [provider]  fulvestrant (FASLODEX) 250 MG/5ML injection Inject into the muscle every 30 (thirty) days. One injection each buttock over 1-2 minutes. Warm prior to use.   Yes [provider]  glipiZIDE (GLUCOTROL XL) 10 MG 24 hr tablet Take 1 tablet (10 mg total) by mouth 2 (two) times daily. 04/11/19  Yes Towanda Malkin, MD  levothyroxine (SYNTHROID) 25 MCG tablet TAKE 1 TABLET (25 MCG TOTAL) BY MOUTH DAILY BEFORE BREAKFAST. 05/14/19  Yes [name], MD  lisinopril (ZESTRIL) 10 MG tablet Take 1 tablet (10 mg total) by mouth every morning. 04/11/19  Yes Towanda Malkin, MD  loperamide (IMODIUM A-D) 2 MG tablet Take 2 mg by  mouth 4 (four) times daily as needed for diarrhea or loose stools.   Yes [provider]  mupirocin ointment (BACTROBAN) 2 % Apply 1 application topically 3 (three) times daily. 02/18/19  Yes Melynda Ripple, MD  ondansetron (ZOFRAN) 8 MG tablet TAKE 1 TABLET (8 MG TOTAL) BY MOUTH EVERY 8 (EIGHT) HOURS AS NEEDED FOR NAUSEA OR VOMITING. 08/20/19  Yes [name], MD  promethazine (PHENERGAN) 25 MG tablet Take 1 tablet (25 mg total) by mouth every 6 (six) hours as needed for nausea or vomiting. 08/20/19  Yes Lloyd Huger, MD  Semaglutide,0.25 or 0.5MG /DOS, (OZEMPIC, 0.25 OR 0.5 MG/DOSE,) 2 MG/1.5ML SOPN Inject 0.25 mg into the skin once a week. 04/11/19  Yes Towanda Malkin, MD  VERZENIO 100 MG tablet TAKE 1 TABLET BY MOUTH TWICE DAILY 06/29/19  Yes Verlon Au, NP  VITAL SIGNS:  Blood pressure 138/75, pulse (!) 115, temperature 98.7 F (37.1 C), temperature source Oral, resp. rate (!) 21, height 5\' 2"  (1.575 m), weight 78.9 kg, SpO2 98 %.  PHYSICAL EXAMINATION:  Physical Exam  GENERAL:  63 y.o.-year-old patient lying in the bed with minimal respiratory distress with conversational dyspnea. EYES: Pupils equal, round, reactive to light and accommodation. No scleral icterus. Extraocular muscles intact.  HEENT: Head atraumatic, normocephalic. Oropharynx and nasopharynx clear.  NECK:  Supple, no jugular venous distention. No thyroid enlargement, no tenderness.  LUNGS: Normal breath sounds bilaterally, no wheezing, rales,rhonchi or crepitation. No use of accessory muscles of respiration.  CARDIOVASCULAR: Regular rate and rhythm, S1, S2 normal. No murmurs, rubs, or gallops.  ABDOMEN: Soft, nondistended, nontender. Bowel sounds present. No organomegaly or mass.  EXTREMITIES: No pedal edema, cyanosis, or clubbing.  NEUROLOGIC: Cranial nerves II through XII are intact. Muscle strength 5/5 in all extremities. Sensation intact. Gait not checked.  PSYCHIATRIC: The  patient is alert and oriented x 3.  Normal affect and good eye contact. SKIN: No obvious rash, lesion, or ulcer.   LABORATORY PANEL:   CBC Recent Labs  Lab 08/20/19 2239  WBC 3.9*  HGB 12.0  HCT 33.9*  PLT 245   ------------------------------------------------------------------------------------------------------------------  Chemistries  Recent Labs  Lab 08/20/19 2239  NA 132*  K 3.7  CL 98  CO2 18*  GLUCOSE 279*  BUN 11  CREATININE 0.90  CALCIUM 9.8  AST 129*  ALT 58*  ALKPHOS 77  BILITOT 1.4*   ------------------------------------------------------------------------------------------------------------------  Cardiac Enzymes No results for input(s): TROPONINI in the last 168 hours. ------------------------------------------------------------------------------------------------------------------  RADIOLOGY:  DG Chest Port 1 View  Result Date: 08/21/2019 CLINICAL DATA:  Nausea EXAM: PORTABLE CHEST 1 VIEW COMPARISON:  Radiograph 02/04/2018 FINDINGS: Bilateral breast prostheses and multiple surgical clips overlying both chest wall and the right axilla. Likely related to prior reconstruction and right axillary dissection. Some low volumes with streaky opacities likely reflecting atelectasis. No consolidation, convincing features of edema, pneumothorax or effusion. The cardiomediastinal contours are unremarkable. No acute osseous or soft tissue abnormality. Degenerative changes are present in the imaged spine and shoulders. IMPRESSION: 1. Low volumes with streaky opacities likely reflecting atelectasis. No other acute cardiopulmonary abnormality. 2. Stable postoperative changes of the chest wall and right axilla. Electronically Signed   By: Lovena Le M.D.   On: 08/21/2019 00:52   US Abdomen Limited RUQ  Result Date: 08/21/2019 CLINICAL DATA:  Vomiting with elevated LFTs EXAM: ULTRASOUND ABDOMEN LIMITED RIGHT UPPER QUADRANT COMPARISON:  None. FINDINGS: Gallbladder:  Cholelithiasis measuring up to 8 mm. A negative sonographic Percell Miller sign was reported by the sonographer. No pericholecystic fluid or wall thickening. Common bile duct: Diameter: 6 mm Liver: Increased hepatic echogenicity without focal lesion. Portal vein is patent on color Doppler imaging with normal direction of blood flow towards the liver. Other: None. IMPRESSION: 1. Cholelithiasis without other evidence of acute cholecystitis. 2. Hepatic steatosis. Electronically Signed   By: Ulyses Jarred M.D.   On: 08/21/2019 00:41      IMPRESSION AND PLAN:   1.  COVID-19 with mainly GI manifestations with intractable nausea and vomiting likely related to gastritis.  The patient will be admitted to a medically monitored bed.  She will be hydrated with IV normal saline.  We will place her on IV PPI therapy and clear liquids advancing her diet as tolerated.  2.  Hypoxemic mild acute respiratory failure likely secondary to COVID-19, mainly while asleep requiring 2 L  of O2 by nasal cannula.  The patient has no reported obstructive sleep apnea history.  We will obtain a chest CTA for further assessment of her hypoxemia as this could be related to COVID-19 related pneumonia not appearing on chest x-ray.  We will therefore place the patient on IV remdesivir.  Her inflammatory markers are minimally elevated.  I will wait for CRP before starting IV steroids as this could exacerbate her gastritis.  I will also hold off on convalescent plasma at this time.  3.  Hypothyroidism.  We will continue Synthroid and check TSH level.  4.  Type II diabetes mellitus.  We will place the patient on supplement coverage with NovoLog and hold her Ozempic and Glucotrol XL for now.  5.  Hypertension.  Continue Zestril.  6.  Dyslipidemia.  Continue statin therapy.     All the records are reviewed and case discussed with ED provider. The plan of care was discussed in details with the patient (and family). I answered all questions.  The patient agreed to proceed with the above mentioned plan. Further management will depend upon hospital course.   CODE STATUS: Full code  TOTAL TIME TAKING CARE OF THIS PATIENT: 60 minutes.    Christel Mormon M.D on 08/21/2019 at 3:21 AM  [facility]   From 7 PM-7 AM, contact night-coverage [email or web address]  CC: Primary care physician; System, Pcp Not In   Note: This dictation was prepared with Dragon dictation along with smaller phrase technology. Any transcriptional errors that result from this process are unintentional.

## 2019-08-21 NOTE — ED Notes (Signed)
Pt tolerating clear liquid diet.

## 2019-08-22 DIAGNOSIS — U071 COVID-19: Principal | ICD-10-CM

## 2019-08-22 LAB — CBC
HCT: 29.2 % — ABNORMAL LOW (ref 36.0–46.0)
Hemoglobin: 9.8 g/dL — ABNORMAL LOW (ref 12.0–15.0)
MCH: 33.1 pg (ref 26.0–34.0)
MCHC: 33.6 g/dL (ref 30.0–36.0)
MCV: 98.6 fL (ref 80.0–100.0)
Platelets: 157 10*3/uL (ref 150–400)
RBC: 2.96 MIL/uL — ABNORMAL LOW (ref 3.87–5.11)
RDW: 13.2 % (ref 11.5–15.5)
WBC: 2 10*3/uL — ABNORMAL LOW (ref 4.0–10.5)
nRBC: 0 % (ref 0.0–0.2)

## 2019-08-22 LAB — COMPREHENSIVE METABOLIC PANEL
ALT: 40 U/L (ref 0–44)
AST: 56 U/L — ABNORMAL HIGH (ref 15–41)
Albumin: 2.9 g/dL — ABNORMAL LOW (ref 3.5–5.0)
Alkaline Phosphatase: 55 U/L (ref 38–126)
Anion gap: 10 (ref 5–15)
BUN: 12 mg/dL (ref 8–23)
CO2: 24 mmol/L (ref 22–32)
Calcium: 8.4 mg/dL — ABNORMAL LOW (ref 8.9–10.3)
Chloride: 100 mmol/L (ref 98–111)
Creatinine, Ser: 1.05 mg/dL — ABNORMAL HIGH (ref 0.44–1.00)
GFR calc Af Amer: >60 mL/min (ref >60)
GFR calc non Af Amer: 56 mL/min — ABNORMAL LOW (ref >60)
Glucose, Bld: 221 mg/dL — ABNORMAL HIGH (ref 70–99)
Potassium: 3.6 mmol/L (ref 3.5–5.1)
Sodium: 134 mmol/L — ABNORMAL LOW (ref 135–145)
Total Bilirubin: 0.3 mg/dL (ref 0.3–1.2)
Total Protein: 6 g/dL — ABNORMAL LOW (ref 6.5–8.1)

## 2019-08-22 LAB — C-REACTIVE PROTEIN: CRP: 0.8 mg/dL (ref <1.0)

## 2019-08-22 LAB — GLUCOSE, CAPILLARY
Glucose-Capillary: 191 mg/dL — ABNORMAL HIGH (ref 70–99)
Glucose-Capillary: 204 mg/dL — ABNORMAL HIGH (ref 70–99)

## 2019-08-22 LAB — FIBRIN DERIVATIVES D-DIMER (ARMC ONLY): Fibrin derivatives D-dimer (ARMC): 497.22 ng/mL (FEU) (ref 0.00–499.00)

## 2019-08-22 NOTE — Progress Notes (Signed)
Kimberly [name] to be D/C'd Home per MD order.  Discussed prescriptions and follow up appointments with the patient.  medication list explained in detail. Pt verbalized understanding. Pt signed COVID package and understands that needs to on quarantine.   Allergies as of 08/22/2019      Reactions   Sulfa Antibiotics Rash      Medication List    TAKE these medications   apixaban 5 MG Tabs tablet Commonly known as: Eliquis Take 1 tablet (5 mg total) by mouth 2 (two) times daily.   atorvastatin 20 MG tablet Commonly known as: LIPITOR Take 1 tablet (20 mg total) by mouth daily.   betamethasone valerate 0.1 % cream Commonly known as: VALISONE Apply 0.1 application topically as needed.   CALCIUM 600 + D PO Take 1 tablet by mouth 2 (two) times daily.   fulvestrant 250 MG/5ML injection Commonly known as: FASLODEX Inject into the muscle every 30 (thirty) days. One injection each buttock over 1-2 minutes. Warm prior to use.   glipiZIDE 10 MG 24 hr tablet Commonly known as: GLUCOTROL XL Take 1 tablet (10 mg total) by mouth 2 (two) times daily.   levothyroxine 25 MCG tablet Commonly known as: SYNTHROID TAKE 1 TABLET (25 MCG TOTAL) BY MOUTH DAILY BEFORE BREAKFAST.   lisinopril 10 MG tablet Commonly known as: ZESTRIL Take 1 tablet (10 mg total) by mouth every morning.   loperamide 2 MG tablet Commonly known as: IMODIUM A-D Take 2 mg by mouth 4 (four) times daily as needed for diarrhea or loose stools.   mupirocin ointment 2 % Commonly known as: Bactroban Apply 1 application topically 3 (three) times daily.   ondansetron 8 MG tablet Commonly known as: ZOFRAN TAKE 1 TABLET (8 MG TOTAL) BY MOUTH EVERY 8 (EIGHT) HOURS AS NEEDED FOR NAUSEA OR VOMITING.   Ozempic (0.25 or 0.5 MG/DOSE) 2 MG/1.5ML Sopn Generic drug: Semaglutide(0.25 or 0.5MG /DOS) Inject 0.25 mg into the skin once a week.   promethazine 25 MG tablet Commonly known as: PHENERGAN Take 1 tablet (25 mg total) by  mouth every 6 (six) hours as needed for nausea or vomiting.   Verzenio 100 MG tablet Generic drug: abemaciclib TAKE 1 TABLET BY MOUTH TWICE DAILY       Vitals:   08/22/19 0517 08/22/19 0742  BP: (!) 111/58 124/66  Pulse: 89 100  Resp: 18 19  Temp: 99.9 F (37.7 C) 99.6 F (37.6 C)  SpO2: 97% 96%    Tele box removed and returned.Skin clean, dry and intact without evidence of skin break down, no evidence of skin tears noted. IV catheter discontinued intact. Site without signs and symptoms of complications. Dressing and pressure applied. Pt denies pain at this time. No complaints noted.  An After Visit Summary was printed and given to the patient. Patient escorted via [place], and D/C home via private auto.  Rolley Sims

## 2019-08-22 NOTE — Discharge Summary (Signed)
Physician Discharge Summary   Kimberly [name]  female DOB: [birth year]  IBB:[id number]  PCP: System, Pcp Not In  Admit date: 08/20/2019 Discharge date: 08/22/2019  Admitted From: home Disposition:  home CODE STATUS: Full code  Discharge Instructions    Diet - low sodium heart healthy   Complete by: As directed    Discharge instructions   Complete by: As directed    Since you don't have respiratory symptoms and don't need supplemental oxygen, you don't need to finish the whole 5 days of Remdesivir.  If you do develop breathing problems, please return to the hospital.  Dr. Enzo Bi   Increase activity slowly   Complete by: As directed        Hospital Course:  For full details, please see H&P, progress notes, consult notes and ancillary notes.  Briefly,  Kimberly [name] is a 63 y.o. F with metastatic BrCa s/p mastectomy on CDK inhib and Faslodex, DM, hypothyroidism, hx DVT, HTN and obesity who presented with nausea and vomiting for 1 day.    Brother in law recently tested positive for COVID and is admitted at [facility].   In the ER, heart rate 109, RR 29.  COVID+ and O2 desaturated while sleeping.  CXR clear vs "streaky opacities likely reflecting atelectasis".    COVID-19 infection Pt didn't have significant respiratory symptoms and didn't require supplemental O2.  Pt's symptoms were N/V.  Pt was found to desat while sleeping which could be related to undiagnosed sleep apnea.  Pt was started on remdesivir on admission but not steroids due to inflammatory marker not every elevated and also to avoid exacerbating pt's gastritis.  Pt received 2 days of Remdesivir, and had resolution of N/V, and was able to eat and drink, so it was deemed not necessary to finish the whole 5 days of Remdesivir.  Pt was discharged home in good condition and knew to return if her symptoms worsen or new ones develop.  Nausea vomiting 2/2 COVID-19 gastritis  N/V and inability to keep even fluids  down were the main reasons for pt's presentation to the ED.  Pt received IV fluids, supportive care with anti-emetics.  Prior to discharge, pt's N/V had resolved, and she was able to tolerate PO intake.  Metastatic breast cancer Continued on Verzenio  Hypothyroidism Continued levothyroxine  History right upper extremity DVT on apixaban Continued apixaban  CKD IIIa Stable and at baseline  Mild elevation of LFT's, improved Likely due to COVID, and trended down and almost back to normal limits prior to discharge.  Hypertension Continued lisinopril  Hyperlipidemia Continued atorvastatin  Diabetes Home glipizide held while inpatient, and pt received SSI.  Glipizide resumed at discharge.    Discharge Diagnoses:  Active Problems:   COVID-19    Discharge Instructions:  Allergies as of 08/22/2019      Reactions   Sulfa Antibiotics Rash      Medication List    TAKE these medications   apixaban 5 MG Tabs tablet Commonly known as: Eliquis Take 1 tablet (5 mg total) by mouth 2 (two) times daily.   atorvastatin 20 MG tablet Commonly known as: LIPITOR Take 1 tablet (20 mg total) by mouth daily.   betamethasone valerate 0.1 % cream Commonly known as: VALISONE Apply 0.1 application topically as needed.   CALCIUM 600 + D PO Take 1 tablet by mouth 2 (two) times daily.   fulvestrant 250 MG/5ML injection Commonly known as: FASLODEX Inject into the muscle every 30 (thirty) days.  One injection each buttock over 1-2 minutes. Warm prior to use.   glipiZIDE 10 MG 24 hr tablet Commonly known as: GLUCOTROL XL Take 1 tablet (10 mg total) by mouth 2 (two) times daily.   levothyroxine 25 MCG tablet Commonly known as: SYNTHROID TAKE 1 TABLET (25 MCG TOTAL) BY MOUTH DAILY BEFORE BREAKFAST.   lisinopril 10 MG tablet Commonly known as: ZESTRIL Take 1 tablet (10 mg total) by mouth every morning.   loperamide 2 MG tablet Commonly known as: IMODIUM A-D Take 2 mg by mouth  4 (four) times daily as needed for diarrhea or loose stools.   mupirocin ointment 2 % Commonly known as: Bactroban Apply 1 application topically 3 (three) times daily.   ondansetron 8 MG tablet Commonly known as: ZOFRAN TAKE 1 TABLET (8 MG TOTAL) BY MOUTH EVERY 8 (EIGHT) HOURS AS NEEDED FOR NAUSEA OR VOMITING.   Ozempic (0.25 or 0.5 MG/DOSE) 2 MG/1.5ML Sopn Generic drug: Semaglutide(0.25 or 0.5MG/DOS) Inject 0.25 mg into the skin once a week.   promethazine 25 MG tablet Commonly known as: PHENERGAN Take 1 tablet (25 mg total) by mouth every 6 (six) hours as needed for nausea or vomiting.   Verzenio 100 MG tablet Generic drug: abemaciclib TAKE 1 TABLET BY MOUTH TWICE DAILY         Allergies  Allergen Reactions  . Sulfa Antibiotics Rash     The results of significant diagnostics from this hospitalization (including imaging, microbiology, ancillary and laboratory) are listed below for reference.   Consultations:   Procedures/Studies: DG Chest Port 1 View  Result Date: 08/21/2019 CLINICAL DATA:  Nausea EXAM: PORTABLE CHEST 1 VIEW COMPARISON:  Radiograph 02/04/2018 FINDINGS: Bilateral breast prostheses and multiple surgical clips overlying both chest wall and the right axilla. Likely related to prior reconstruction and right axillary dissection. Some low volumes with streaky opacities likely reflecting atelectasis. No consolidation, convincing features of edema, pneumothorax or effusion. The cardiomediastinal contours are unremarkable. No acute osseous or soft tissue abnormality. Degenerative changes are present in the imaged spine and shoulders. IMPRESSION: 1. Low volumes with streaky opacities likely reflecting atelectasis. No other acute cardiopulmonary abnormality. 2. Stable postoperative changes of the chest wall and right axilla. Electronically Signed   By: Lovena Le M.D.   On: 08/21/2019 00:52   US Abdomen Limited RUQ  Result Date: 08/21/2019 CLINICAL DATA:   Vomiting with elevated LFTs EXAM: ULTRASOUND ABDOMEN LIMITED RIGHT UPPER QUADRANT COMPARISON:  None. FINDINGS: Gallbladder: Cholelithiasis measuring up to 8 mm. A negative sonographic Percell Miller sign was reported by the sonographer. No pericholecystic fluid or wall thickening. Common bile duct: Diameter: 6 mm Liver: Increased hepatic echogenicity without focal lesion. Portal vein is patent on color Doppler imaging with normal direction of blood flow towards the liver. Other: None. IMPRESSION: 1. Cholelithiasis without other evidence of acute cholecystitis. 2. Hepatic steatosis. Electronically Signed   By: Ulyses Jarred M.D.   On: 08/21/2019 00:41      Labs: BNP (last 3 results) No results for input(s): BNP in the last 8760 hours. Basic Metabolic Panel: Recent Labs  Lab 08/20/19 2239 08/21/19 0326 08/22/19 0501  NA 132* 134* 134*  K 3.7 3.7 3.6  CL 98 98 100  CO2 18* 24 24  GLUCOSE 279* 282* 221*  BUN _0 CREATININE 0.90 0.96 1.05*  CALCIUM 9.8 9.5 8.4*   Liver Function Tests: Recent Labs  Lab 08/20/19 2239 08/21/19 0326 08/22/19 0501  AST 129* 95* 56*  ALT 58* 58* 40  ALKPHOS 77 74 55  BILITOT 1.4* 0.9 0.3  PROT 6.8 7.0 6.0*  ALBUMIN 3.5 3.5 2.9*   Recent Labs  Lab 08/20/19 2239  LIPASE 20   No results for input(s): AMMONIA in the last 168 hours. CBC: Recent Labs  Lab 08/20/19 2239 08/22/19 0501  WBC 3.9* 2.0*  HGB 12.0 9.8*  HCT 33.9* 29.2*  MCV 93.1 98.6  PLT 245 157   Cardiac Enzymes: No results for input(s): CKTOTAL, CKMB, CKMBINDEX, TROPONINI in the last 168 hours. BNP: Invalid input(s): POCBNP CBG: Recent Labs  Lab 08/21/19 1151 08/21/19 1625 08/21/19 2044 08/22/19 0740 08/22/19 1126  GLUCAP 173* 141* 213* 191* 204*   D-Dimer No results for input(s): DDIMER in the last 72 hours. Hgb A1c No results for input(s): HGBA1C in the last 72 hours. Lipid Profile No results for input(s): CHOL, HDL, LDLCALC, TRIG, CHOLHDL, LDLDIRECT in the last 72  hours. Thyroid function studies No results for input(s): TSH, T4TOTAL, T3FREE, THYROIDAB in the last 72 hours.  Invalid input(s): FREET3 Anemia work up No results for input(s): VITAMINB12, FOLATE, FERRITIN, TIBC, IRON, RETICCTPCT in the last 72 hours. Urinalysis    Component Value Date/Time   COLORURINE YELLOW (A) 04/25/2017 1421   APPEARANCEUR CLEAR (A) 04/25/2017 1421   APPEARANCEUR CLEAR 08/14/2012 1405   LABSPEC 1.021 04/25/2017 1421   LABSPEC >=1.030 08/14/2012 1405   PHURINE 5.0 04/25/2017 1421   GLUCOSEU NEGATIVE 04/25/2017 1421   GLUCOSEU NEGATIVE 08/14/2012 1405   HGBUR NEGATIVE 04/25/2017 1421   BILIRUBINUR NEGATIVE 04/25/2017 1421   BILIRUBINUR NEGATIVE 08/14/2012 1405   KETONESUR NEGATIVE 04/25/2017 1421   PROTEINUR NEGATIVE 04/25/2017 1421   NITRITE NEGATIVE 04/25/2017 1421   LEUKOCYTESUR TRACE (A) 04/25/2017 1421   LEUKOCYTESUR NEGATIVE 08/14/2012 1405   Sepsis Labs Invalid input(s): PROCALCITONIN,  WBC,  LACTICIDVEN Microbiology Recent Results (from the past 240 hour(s))  Respiratory Panel by RT PCR (Flu A&B, Covid) - Nasopharyngeal Swab     Status: Abnormal   Collection Time: 08/21/19 12:58 AM   Specimen: Nasopharyngeal Swab  Result Value Ref Range Status   SARS Coronavirus 2 by RT PCR POSITIVE (A) NEGATIVE Final    Comment: RESULT CALLED TO, READ BACK BY AND VERIFIED WITH: Georgann Housekeeper RN [id number] 08/21/2019 HNM (NOTE) SARS-CoV-2 target nucleic acids are DETECTED. SARS-CoV-2 RNA is generally detectable in upper respiratory specimens  during the acute phase of infection. Positive results are indicative of the presence of the identified virus, but do not rule out bacterial infection or co-infection with other pathogens not detected by the test. Clinical correlation with patient history and other diagnostic information is necessary to determine patient infection status. The expected result is Negative. Fact Sheet for Patients:    PinkCheek.be Fact Sheet for Healthcare Providers: GravelBags.it This test is not yet approved or cleared by the [place] FDA and  has been authorized for detection and/or diagnosis of SARS-CoV-2 by FDA under an Emergency Use Authorization (EUA).  This EUA will remain in effect (meaning this test can be used) f or the duration of  the COVID-19 declaration under Section 564(b)(1) of the Act, 21 U.S.C. section 360bbb-3(b)(1), unless the authorization is terminated or revoked sooner.    Influenza A by PCR NEGATIVE NEGATIVE Final   Influenza B by PCR NEGATIVE NEGATIVE Final    Comment: (NOTE) The Xpert Xpress SARS-CoV-2/FLU/RSV assay is intended as an aid in  the diagnosis of influenza from Nasopharyngeal swab specimens and  should not be used as a sole basis for  treatment. Nasal washings and  aspirates are unacceptable for Xpert Xpress SARS-CoV-2/FLU/RSV  testing. Fact Sheet for Patients: PinkCheek.be Fact Sheet for Healthcare Providers: GravelBags.it This test is not yet approved or cleared by the [place] FDA and  has been authorized for detection and/or diagnosis of SARS-CoV-2 by  FDA under an Emergency Use Authorization (EUA). This EUA will remain  in effect (meaning this test can be used) for the duration of the  Covid-19 declaration under Section 564(b)(1) of the Act, 21  U.S.C. section 360bbb-3(b)(1), unless the authorization is  terminated or revoked. Performed at [facility], [address] [place],  [address]      Total time spend on discharging this patient, including the last patient exam, discussing the hospital stay, instructions for ongoing care as it relates to all pertinent caregivers, as well as preparing the medical discharge records, prescriptions, and/or referrals as applicable, is 35 minutes.    Enzo Bi, MD  [facility] 08/24/2019, 11:34 PM  If 7PM-7AM, please contact night-coverage

## 2019-08-23 ENCOUNTER — Telehealth: Payer: Self-pay | Admitting: Internal Medicine

## 2019-08-23 NOTE — Telephone Encounter (Signed)
Spoke with patient. Pt instructed to continue to hold the Abemaciclib. She gave verbal understanding of the plan of care.

## 2019-08-23 NOTE — Telephone Encounter (Signed)
12/23-spoke to patient regarding recent admission to the hospital for Covid infection.  Currently discharged home.  We will follow-up as planned. Recommend HOLDING Abemaciclib.  H/T- please inform to HOLD Abemaciclib/Verzinio; until the next visit; otherwise follow up as planned.

## 2019-08-29 ENCOUNTER — Emergency Department
Admission: EM | Admit: 2019-08-29 | Discharge: 2019-08-29 | Disposition: A | Discharge status: Home / Self Care | Payer: 59 | Attending: Emergency Medicine | Admitting: Emergency Medicine

## 2019-08-29 ENCOUNTER — Other Ambulatory Visit: Payer: Self-pay

## 2019-08-29 ENCOUNTER — Encounter: Payer: Self-pay | Admitting: *Deleted

## 2019-08-29 DIAGNOSIS — A084 Viral intestinal infection, unspecified: Secondary | ICD-10-CM | POA: Insufficient documentation

## 2019-08-29 DIAGNOSIS — R112 Nausea with vomiting, unspecified: Secondary | ICD-10-CM | POA: Diagnosis not present

## 2019-08-29 DIAGNOSIS — R000 Tachycardia, unspecified: Secondary | ICD-10-CM | POA: Diagnosis not present

## 2019-08-29 DIAGNOSIS — I10 Essential (primary) hypertension: Secondary | ICD-10-CM | POA: Diagnosis not present

## 2019-08-29 DIAGNOSIS — I82409 Acute embolism and thrombosis of unspecified deep veins of unspecified lower extremity: Secondary | ICD-10-CM | POA: Diagnosis not present

## 2019-08-29 DIAGNOSIS — Z79899 Other long term (current) drug therapy: Secondary | ICD-10-CM | POA: Diagnosis not present

## 2019-08-29 DIAGNOSIS — E039 Hypothyroidism, unspecified: Secondary | ICD-10-CM | POA: Diagnosis not present

## 2019-08-29 DIAGNOSIS — R110 Nausea: Secondary | ICD-10-CM | POA: Diagnosis not present

## 2019-08-29 DIAGNOSIS — Z7901 Long term (current) use of anticoagulants: Secondary | ICD-10-CM | POA: Insufficient documentation

## 2019-08-29 DIAGNOSIS — R0902 Hypoxemia: Secondary | ICD-10-CM | POA: Diagnosis not present

## 2019-08-29 DIAGNOSIS — Z7984 Long term (current) use of oral hypoglycemic drugs: Secondary | ICD-10-CM | POA: Diagnosis not present

## 2019-08-29 DIAGNOSIS — C50511 Malignant neoplasm of lower-outer quadrant of right female breast: Secondary | ICD-10-CM | POA: Diagnosis not present

## 2019-08-29 DIAGNOSIS — Z794 Long term (current) use of insulin: Secondary | ICD-10-CM

## 2019-08-29 DIAGNOSIS — U071 COVID-19: Secondary | ICD-10-CM | POA: Insufficient documentation

## 2019-08-29 DIAGNOSIS — E1165 Type 2 diabetes mellitus with hyperglycemia: Secondary | ICD-10-CM | POA: Diagnosis not present

## 2019-08-29 DIAGNOSIS — E860 Dehydration: Secondary | ICD-10-CM | POA: Insufficient documentation

## 2019-08-29 LAB — COMPREHENSIVE METABOLIC PANEL
ALT: 55 U/L — ABNORMAL HIGH (ref 0–44)
AST: 97 U/L — ABNORMAL HIGH (ref 15–41)
Albumin: 3.5 g/dL (ref 3.5–5.0)
Alkaline Phosphatase: 67 U/L (ref 38–126)
Anion gap: 13 (ref 5–15)
BUN: 30 mg/dL — ABNORMAL HIGH (ref 8–23)
CO2: 24 mmol/L (ref 22–32)
Calcium: 8.3 mg/dL — ABNORMAL LOW (ref 8.9–10.3)
Chloride: 93 mmol/L — ABNORMAL LOW (ref 98–111)
Creatinine, Ser: 1.54 mg/dL — ABNORMAL HIGH (ref 0.44–1.00)
GFR calc Af Amer: 41 mL/min — ABNORMAL LOW (ref >60)
GFR calc non Af Amer: 35 mL/min — ABNORMAL LOW (ref >60)
Glucose, Bld: 389 mg/dL — ABNORMAL HIGH (ref 70–99)
Potassium: 3.8 mmol/L (ref 3.5–5.1)
Sodium: 130 mmol/L — ABNORMAL LOW (ref 135–145)
Total Bilirubin: 1.1 mg/dL (ref 0.3–1.2)
Total Protein: 7.4 g/dL (ref 6.5–8.1)

## 2019-08-29 LAB — CBC WITH DIFFERENTIAL/PLATELET
Abs Immature Granulocytes: 0.02 10*3/uL (ref 0.00–0.07)
Basophils Absolute: 0 10*3/uL (ref 0.0–0.1)
Basophils Relative: 0 %
Eosinophils Absolute: 0 10*3/uL (ref 0.0–0.5)
Eosinophils Relative: 0 %
HCT: 35.7 % — ABNORMAL LOW (ref 36.0–46.0)
Hemoglobin: 12.6 g/dL (ref 12.0–15.0)
Immature Granulocytes: 1 %
Lymphocytes Relative: 37 %
Lymphs Abs: 1 10*3/uL (ref 0.7–4.0)
MCH: 32 pg (ref 26.0–34.0)
MCHC: 35.3 g/dL (ref 30.0–36.0)
MCV: 90.6 fL (ref 80.0–100.0)
Monocytes Absolute: 0.2 10*3/uL (ref 0.1–1.0)
Monocytes Relative: 8 %
Neutro Abs: 1.4 10*3/uL — ABNORMAL LOW (ref 1.7–7.7)
Neutrophils Relative %: 54 %
Platelets: 217 10*3/uL (ref 150–400)
RBC: 3.94 MIL/uL (ref 3.87–5.11)
RDW: 13.2 % (ref 11.5–15.5)
Smear Review: NORMAL
WBC: 2.6 10*3/uL — ABNORMAL LOW (ref 4.0–10.5)
nRBC: 0 % (ref 0.0–0.2)

## 2019-08-29 LAB — CBC
HCT: 36.5 % (ref 36.0–46.0)
Hemoglobin: 12.5 g/dL (ref 12.0–15.0)
MCH: 32.1 pg (ref 26.0–34.0)
MCHC: 34.2 g/dL (ref 30.0–36.0)
MCV: 93.8 fL (ref 80.0–100.0)
Platelets: 204 10*3/uL (ref 150–400)
RBC: 3.89 MIL/uL (ref 3.87–5.11)
RDW: 13.2 % (ref 11.5–15.5)
WBC: 2.5 10*3/uL — ABNORMAL LOW (ref 4.0–10.5)
nRBC: 0 % (ref 0.0–0.2)

## 2019-08-29 LAB — LIPASE, BLOOD: Lipase: 31 U/L (ref 11–51)

## 2019-08-29 LAB — GLUCOSE, CAPILLARY
Glucose-Capillary: 390 mg/dL — ABNORMAL HIGH (ref 70–99)
Glucose-Capillary: 428 mg/dL — ABNORMAL HIGH (ref 70–99)
Glucose-Capillary: 330 mg/dL — ABNORMAL HIGH (ref 70–99)

## 2019-08-29 MED ORDER — FAMOTIDINE 20 MG PO TABS: 20.0000 mg | ORAL_TABLET | Freq: Two times a day (BID) | ORAL | 0 refills | Status: DC

## 2019-08-29 MED ORDER — SODIUM CHLORIDE 0.9 % IV BOLUS
1000.0000 mL | Freq: Once | INTRAVENOUS | Status: AC
  Administered 2019-08-29: 1000 mL via INTRAVENOUS

## 2019-08-29 MED ORDER — ONDANSETRON 4 MG PO TBDP: 4.0000 mg | ORAL_TABLET | Freq: Three times a day (TID) | ORAL | 0 refills | Status: DC | PRN

## 2019-08-29 MED ORDER — INSULIN ASPART 100 UNIT/ML ~~LOC~~ SOLN
6.0000 [IU] | Freq: Once | SUBCUTANEOUS | Status: AC
  Administered 2019-08-29: 6 [IU] via SUBCUTANEOUS

## 2019-08-29 MED ORDER — SODIUM CHLORIDE 0.9% FLUSH: 3.0000 mL | Freq: Once | INTRAVENOUS | Status: DC

## 2019-08-29 MED ORDER — INSULIN ASPART 100 UNIT/ML ~~LOC~~ SOLN
6.0000 [IU] | Freq: Once | SUBCUTANEOUS | Status: DC
  Filled 2019-08-29: qty 1

## 2019-08-29 MED ORDER — ALUMINUM-MAGNESIUM-SIMETHICONE 200-200-20 MG/5ML PO SUSP: 30.0000 mL | Freq: Three times a day (TID) | ORAL | 0 refills | Status: DC

## 2019-08-29 MED ORDER — ACETAMINOPHEN 325 MG PO TABS
650.0000 mg | ORAL_TABLET | Freq: Once | ORAL | Status: AC
  Administered 2019-08-29: 650 mg via ORAL
  Filled 2019-08-29: qty 2

## 2019-08-29 MED ORDER — PANTOPRAZOLE SODIUM 40 MG IV SOLR
40.0000 mg | Freq: Once | INTRAVENOUS | Status: AC
  Administered 2019-08-29: 40 mg via INTRAVENOUS
  Filled 2019-08-29: qty 40

## 2019-08-29 NOTE — ED Triage Notes (Signed)
Pt arrives via EMS from home. Pt COVID + on 12/22. Pt c/o abdominal pain, n/v. Zofran 4 mg x 2. 114HR, 96% RA, 130/86, 100.1 temp. IV established in the left hand.

## 2019-08-29 NOTE — ED Triage Notes (Signed)
N/V since Sunday. Recently hospitalized for the same. Diarrhea started last night, has had fevers. Mild cough. COVID +

## 2019-08-29 NOTE — ED Notes (Signed)
Pt provided cup of water and saltines for PO challenge.  EDP, [place] aware.

## 2019-08-29 NOTE — ED Notes (Signed)
Pt reports has been sick since Sunday with nausea, vomiting and decreased appetite. Pt denies pain currently. Pt dx'd with COVID on 12/22

## 2019-08-29 NOTE — ED Provider Notes (Signed)
[facility] Emergency Department Provider Note  ____________________________________________  Time seen: Approximately 8:20 AM  I have reviewed the triage vital signs and the nursing notes.   HISTORY  Chief Complaint Abdominal Pain (covid +)    HPI Kimberly [name] is a 63 y.o. female with a history of breast cancer on chemotherapy, DVT on Eliquis, hypertension diabetes and recently diagnosed with Covid 8 days ago who comes the ED complaining of fever chills cough vomiting diarrhea.  On initial diagnosis of Covid, she had presented with nausea and vomiting and was admitted to the hospital at that time.  She was discharged after 2 days after resolution of her symptoms.  She received 2 days of remdesivir during that hospitalization but not steroids.  Since being discharged home, her symptoms of recently worsened again over the last 5 days.  She is now having diarrhea which is new.  She is having persistent nausea and vomiting as well.  Denies shortness of breath or chest pain.  Endorses fatigue and body aches.      Past Medical History:  Diagnosis Date  . Arthritis   . Breast cancer ([place])    current in between breast   . Cancer of lower-outer quadrant of female breast ([place]) 08/06/2011   RIGHT   . High cholesterol   . History of kidney stones   . Hx of bilateral breast implants   . Hypertension   . PONV (postoperative nausea and vomiting)   . Type II diabetes mellitus ([facility])    fasting 140-150  . Vertigo    LAST WEEK  . Vertigo 01/2017     Patient Active Problem List   Diagnosis Date Noted  . COVID-19 08/21/2019  . Hypercholesterolemia 10/27/2018  . Thyroid dysfunction 10/27/2018  . Malignant tumor of breast ([place]) 10/20/2018  . Osteopenia 10/20/2018  . Pain and swelling of right upper extremity 04/11/2018  . Chest pain 02/04/2018  . Vertigo 03/08/2017  . Adenopathy   . Mediastinal adenopathy 01/25/2017  . Diabetes mellitus ([place]) 03/24/2012   . Carcinoma of lower-outer quadrant of right breast in female, estrogen receptor positive ([place]) 08/06/2011     Past Surgical History:  Procedure Laterality Date  . BREAST BIOPSY  07/2011, 2020   right  . BREAST RECONSTRUCTION  03/07/2012   Procedure: BREAST RECONSTRUCTION;  Surgeon: Crissie Reese, MD;  Location: [place];  Service: Plastics;  Laterality: Left;  BREAST RECONSTRUCTION WITH PLACEMENT OF TISSUE EXPANDER TO LEFT BREAST  . BREAST RECONSTRUCTION  02/06/2013  . CESAREAN SECTION  [id number]  . ENDOBRONCHIAL ULTRASOUND N/A 02/24/2017   Procedure: ENDOBRONCHIAL ULTRASOUND;  Surgeon: [name], MD;  Location: [facility];  Service: Cardiopulmonary;  Laterality: N/A;  . LATISSIMUS FLAP TO BREAST Right 02/06/2013   Procedure: LATISSIMUS FLAP TO RIGHT BREAST WITH IMPLANT;  Surgeon: Crissie Reese, MD;  Location: [place];  Service: Plastics;  Laterality: Right;  . MASTECTOMY  03/07/12   modified right; total left  . MODIFIED MASTECTOMY  03/07/2012   Procedure: MODIFIED MASTECTOMY;  Surgeon: Haywood Lasso, MD;  Location: [place];  Service: General;  Laterality: Right;  . PORTACATH PLACEMENT  07/2011  . SCAR REVISION  03/30/2012   Procedure: SCAR REVISION;  Surgeon: Haywood Lasso, MD;  Location: [place];  Service: General;  Laterality: Right;  CLOSURE OF MASTECTOMY INCISION  . WISDOM TOOTH EXTRACTION       Prior to Admission medications   Medication Sig Start Date End Date Taking? Authorizing Provider  apixaban (ELIQUIS) 5 MG TABS tablet Take 1 tablet (5 mg total) by mouth 2 (two) times daily. 02/23/19   [name], MD  atorvastatin (LIPITOR) 20 MG tablet Take 1 tablet (20 mg total) by mouth daily. 04/11/19   Towanda Malkin, MD  betamethasone valerate (VALISONE) 0.1 % cream Apply 0.1 application topically as needed. 06/08/19   [provider]  Calcium Carbonate-Vitamin D (CALCIUM 600 + D PO) Take 1 tablet by mouth 2 (two) times daily.    [provider]  fulvestrant (FASLODEX) 250 MG/5ML injection Inject into the muscle every 30 (thirty) days. One injection each buttock over 1-2 minutes. Warm prior to use.    [provider]  glipiZIDE (GLUCOTROL XL) 10 MG 24 hr tablet Take 1 tablet (10 mg total) by mouth 2 (two) times daily. 04/11/19   Towanda Malkin, MD  levothyroxine (SYNTHROID) 25 MCG tablet TAKE 1 TABLET (25 MCG TOTAL) BY MOUTH DAILY BEFORE BREAKFAST. 05/14/19   [name], MD  lisinopril (ZESTRIL) 10 MG tablet Take 1 tablet (10 mg total) by mouth every morning. 04/11/19   Towanda Malkin, MD  loperamide (IMODIUM A-D) 2 MG tablet Take 2 mg by mouth 4 (four) times daily as needed for diarrhea or loose stools.    [provider]  mupirocin ointment (BACTROBAN) 2 % Apply 1 application topically 3 (three) times daily. 02/18/19   Melynda Ripple, MD  ondansetron (ZOFRAN) 8 MG tablet TAKE 1 TABLET (8 MG TOTAL) BY MOUTH EVERY 8 (EIGHT) HOURS AS NEEDED FOR NAUSEA OR VOMITING. 08/20/19   [name], MD  promethazine (PHENERGAN) 25 MG tablet Take 1 tablet (25 mg total) by mouth every 6 (six) hours as needed for nausea or vomiting. 08/20/19   Lloyd Huger, MD  Semaglutide,0.25 or 0.5MG /DOS, (OZEMPIC, 0.25 OR 0.5 MG/DOSE,) 2 MG/1.5ML SOPN Inject 0.25 mg into the skin once a week. 04/11/19   Towanda Malkin, MD  VERZENIO 100 MG tablet TAKE 1 TABLET BY MOUTH TWICE DAILY 06/29/19   Verlon Au, NP     Allergies Sulfa antibiotics   Family History  Problem Relation Age of Onset  . Breast cancer Mother   . Diabetes Father     Social History Social History   Tobacco Use  . Smoking status: Never Smoker  . Smokeless tobacco: Never Used  Substance Use Topics  . Alcohol use: No  . Drug use: No    Review of Systems  Constitutional:   Positive fever and chills.  ENT:   No sore throat. No rhinorrhea. Cardiovascular:   No chest pain or syncope. Respiratory:    No dyspnea, positive nonproductive cough. Gastrointestinal:   Negative for abdominal pain, positive for vomiting and diarrhea.  Musculoskeletal:   Negative for focal pain or swelling All other systems reviewed and are negative except as documented above in ROS and HPI.  ____________________________________________   PHYSICAL EXAM:  VITAL SIGNS: ED Triage Vitals  Enc Vitals Group     BP 08/29/19 0507 122/78     Pulse Rate 08/29/19 0507 (!) 111     Resp 08/29/19 0507 20     Temp 08/29/19 0507 (!) 100.6 F (38.1 C)     Temp Source 08/29/19 0507 Oral     SpO2 08/29/19 0507 96 %     Weight --      Height --      Head Circumference --      Peak Flow --  Pain Score 08/29/19 0505 7     Pain Loc --      Pain Edu? --      Excl. in [place]? --     Vital signs reviewed, nursing assessments reviewed.   Constitutional:   Alert and oriented. Non-toxic appearance. Eyes:   Conjunctivae are normal. EOMI. PERRL. ENT      Head:   Normocephalic and atraumatic.      Nose:   Wearing a mask.      Mouth/Throat:   Wearing a mask.      Neck:   No meningismus. Full ROM. Hematological/Lymphatic/Immunilogical:   No cervical lymphadenopathy. Cardiovascular:   Tachycardia heart rate 110. Symmetric bilateral radial and DP pulses.  No murmurs. Cap refill less than 2 seconds. Respiratory:   Normal respiratory effort without tachypnea/retractions. Breath sounds are clear and equal bilaterally. No wheezes/rales/rhonchi. Gastrointestinal: Hyperactive bowel sounds.  Soft and nontender. Non distended. There is no CVA tenderness.  No rebound, rigidity, or guarding.  Musculoskeletal:   Normal range of motion in all extremities. No joint effusions.  No lower extremity tenderness.  No edema. Neurologic:   Normal speech and language.  Motor grossly intact. No acute focal neurologic deficits are appreciated.  Skin:    Skin is warm, dry and intact. No rash noted.  No petechiae, purpura, or  bullae.  ____________________________________________    LABS (pertinent positives/negatives) (all labs ordered are listed, but only abnormal results are displayed) Labs Reviewed  COMPREHENSIVE METABOLIC PANEL - Abnormal; Notable for the following components:      Result Value   Sodium 130 (*)    Chloride 93 (*)    Glucose, Bld 389 (*)    BUN 30 (*)    Creatinine, Ser 1.54 (*)    Calcium 8.3 (*)    AST 97 (*)    ALT 55 (*)    GFR calc non Af Amer 35 (*)    GFR calc Af Amer 41 (*)    All other components within normal limits  CBC - Abnormal; Notable for the following components:   WBC 2.5 (*)    All other components within normal limits  GLUCOSE, CAPILLARY - Abnormal; Notable for the following components:   Glucose-Capillary 390 (*)    All other components within normal limits  CBC WITH DIFFERENTIAL/PLATELET - Abnormal; Notable for the following components:   WBC 2.6 (*)    HCT 35.7 (*)    All other components within normal limits  LIPASE, BLOOD  URINALYSIS, COMPLETE (UACMP) WITH MICROSCOPIC   ____________________________________________   EKG    ____________________________________________    RADIOLOGY  No results found.  ____________________________________________   PROCEDURES Procedures  ____________________________________________    CLINICAL IMPRESSION / ASSESSMENT AND PLAN / ED COURSE  Medications ordered in the ED: Medications  sodium chloride flush (NS) 0.9 % injection 3 mL (has no administration in time range)  sodium chloride 0.9 % bolus 1,000 mL (has no administration in time range)  insulin aspart (novoLOG) injection 6 Units (has no administration in time range)  pantoprazole (PROTONIX) injection 40 mg (has no administration in time range)  acetaminophen (TYLENOL) tablet 650 mg (650 mg Oral Given 08/29/19 0514)    Pertinent labs & imaging results that were available during my care of the patient were reviewed by me and considered in my  medical decision making (see chart for details).  Kimberly [name] was evaluated in Emergency Department on 08/29/2019 for the symptoms described in the history of present illness. She  was evaluated in the context of the global COVID-19 pandemic, which necessitated consideration that the patient might be at risk for infection with the SARS-CoV-2 virus that causes COVID-19. Institutional protocols and algorithms that pertain to the evaluation of patients at risk for COVID-19 are in a state of rapid change based on information released by regulatory bodies including the CDC and federal and state organizations. These policies and algorithms were followed during the patient's care in the ED.   Patient presents with nausea vomiting diarrhea, dehydration, hyperglycemia all consistent with COVID-19 induced gastroenteritis.  Considering the patient's symptoms, medical history, and physical examination today, I have low suspicion for cholecystitis or biliary pathology, pancreatitis, perforation or bowel obstruction, hernia, intra-abdominal abscess, AAA or dissection, volvulus or intussusception, mesenteric ischemia, or appendicitis.  Abdominal exam is benign and reassuring and nonsurgical.  Patient is not septic.  Lab panel is reassuring.  I will give IV fluids for hydration, 6 units of IV insulin aspart for control of hyperglycemia.  Patient already received IV Zofran.  I will IV Protonix as well.  Symptoms are controlled and she is tolerating oral intake, she will be stable for discharge home on antiemetics and antacids continue her recovery in outpatient setting.  Return precautions discussed.    ----------------------------------------- 11:37 AM on 08/29/2019 -----------------------------------------  Tachycardia improved with IV fluids.  Tolerating oral intake now, symptoms improved.  Will discharge home with Zofran and Pepcid, continue home quarantine and follow-up with health department.      ____________________________________________   FINAL CLINICAL IMPRESSION(S) / ED DIAGNOSES    Final diagnoses:  COVID-19 virus infection  Viral gastroenteritis  Dehydration  Type 2 diabetes mellitus with hyperglycemia, with long-term current use of insulin [facility])     ED Discharge Orders    None      Portions of this note were generated with dragon dictation software. Dictation errors may occur despite best attempts at proofreading.   Carrie Mew, MD 08/29/19 1137

## 2019-09-02 ENCOUNTER — Encounter: Payer: Self-pay | Admitting: Internal Medicine

## 2019-09-05 ENCOUNTER — Ambulatory Visit: Payer: Managed Care, Other (non HMO)

## 2019-09-06 ENCOUNTER — Other Ambulatory Visit: Payer: Self-pay

## 2019-09-06 ENCOUNTER — Inpatient Hospital Stay
Admission: EM | Admit: 2019-09-06 | Discharge: 2019-09-10 | DRG: 194 | Disposition: A | Discharge status: Home / Self Care | Payer: 59 | Attending: Internal Medicine | Admitting: Internal Medicine

## 2019-09-06 ENCOUNTER — Telehealth: Payer: Self-pay | Admitting: *Deleted

## 2019-09-06 ENCOUNTER — Emergency Department: Payer: 59

## 2019-09-06 DIAGNOSIS — J181 Lobar pneumonia, unspecified organism: Principal | ICD-10-CM

## 2019-09-06 DIAGNOSIS — Z9011 Acquired absence of right breast and nipple: Secondary | ICD-10-CM

## 2019-09-06 DIAGNOSIS — E860 Dehydration: Secondary | ICD-10-CM | POA: Diagnosis not present

## 2019-09-06 DIAGNOSIS — R112 Nausea with vomiting, unspecified: Secondary | ICD-10-CM

## 2019-09-06 DIAGNOSIS — R531 Weakness: Secondary | ICD-10-CM

## 2019-09-06 DIAGNOSIS — Z7901 Long term (current) use of anticoagulants: Secondary | ICD-10-CM

## 2019-09-06 DIAGNOSIS — D638 Anemia in other chronic diseases classified elsewhere: Secondary | ICD-10-CM

## 2019-09-06 DIAGNOSIS — E876 Hypokalemia: Secondary | ICD-10-CM | POA: Diagnosis not present

## 2019-09-06 DIAGNOSIS — E7800 Pure hypercholesterolemia, unspecified: Secondary | ICD-10-CM | POA: Diagnosis present

## 2019-09-06 DIAGNOSIS — N183 Chronic kidney disease, stage 3 unspecified: Secondary | ICD-10-CM | POA: Diagnosis present

## 2019-09-06 DIAGNOSIS — E871 Hypo-osmolality and hyponatremia: Secondary | ICD-10-CM | POA: Diagnosis present

## 2019-09-06 DIAGNOSIS — D849 Immunodeficiency, unspecified: Secondary | ICD-10-CM | POA: Diagnosis present

## 2019-09-06 DIAGNOSIS — Z853 Personal history of malignant neoplasm of breast: Secondary | ICD-10-CM

## 2019-09-06 DIAGNOSIS — D631 Anemia in chronic kidney disease: Secondary | ICD-10-CM | POA: Diagnosis present

## 2019-09-06 DIAGNOSIS — E1165 Type 2 diabetes mellitus with hyperglycemia: Secondary | ICD-10-CM | POA: Diagnosis present

## 2019-09-06 DIAGNOSIS — C50919 Malignant neoplasm of unspecified site of unspecified female breast: Secondary | ICD-10-CM

## 2019-09-06 DIAGNOSIS — Z79899 Other long term (current) drug therapy: Secondary | ICD-10-CM

## 2019-09-06 DIAGNOSIS — I129 Hypertensive chronic kidney disease with stage 1 through stage 4 chronic kidney disease, or unspecified chronic kidney disease: Secondary | ICD-10-CM | POA: Diagnosis present

## 2019-09-06 DIAGNOSIS — Z66 Do not resuscitate: Secondary | ICD-10-CM | POA: Diagnosis present

## 2019-09-06 DIAGNOSIS — E039 Hypothyroidism, unspecified: Secondary | ICD-10-CM

## 2019-09-06 DIAGNOSIS — R471 Dysarthria and anarthria: Secondary | ICD-10-CM

## 2019-09-06 DIAGNOSIS — R0602 Shortness of breath: Secondary | ICD-10-CM

## 2019-09-06 DIAGNOSIS — Z86718 Personal history of other venous thrombosis and embolism: Secondary | ICD-10-CM

## 2019-09-06 DIAGNOSIS — E1122 Type 2 diabetes mellitus with diabetic chronic kidney disease: Secondary | ICD-10-CM | POA: Diagnosis present

## 2019-09-06 DIAGNOSIS — Z87442 Personal history of urinary calculi: Secondary | ICD-10-CM

## 2019-09-06 DIAGNOSIS — C799 Secondary malignant neoplasm of unspecified site: Secondary | ICD-10-CM | POA: Diagnosis present

## 2019-09-06 DIAGNOSIS — U071 COVID-19: Secondary | ICD-10-CM

## 2019-09-06 DIAGNOSIS — Z79890 Hormone replacement therapy (postmenopausal): Secondary | ICD-10-CM

## 2019-09-06 DIAGNOSIS — Z882 Allergy status to sulfonamides status: Secondary | ICD-10-CM

## 2019-09-06 DIAGNOSIS — Z8616 Personal history of COVID-19: Secondary | ICD-10-CM

## 2019-09-06 DIAGNOSIS — Z9221 Personal history of antineoplastic chemotherapy: Secondary | ICD-10-CM

## 2019-09-06 DIAGNOSIS — Z803 Family history of malignant neoplasm of breast: Secondary | ICD-10-CM

## 2019-09-06 DIAGNOSIS — Z7984 Long term (current) use of oral hypoglycemic drugs: Secondary | ICD-10-CM

## 2019-09-06 DIAGNOSIS — M1990 Unspecified osteoarthritis, unspecified site: Secondary | ICD-10-CM | POA: Diagnosis present

## 2019-09-06 DIAGNOSIS — Z9882 Breast implant status: Secondary | ICD-10-CM

## 2019-09-06 DIAGNOSIS — Z833 Family history of diabetes mellitus: Secondary | ICD-10-CM

## 2019-09-06 LAB — GLUCOSE, CAPILLARY
Glucose-Capillary: 290 mg/dL — ABNORMAL HIGH (ref 70–99)
Glucose-Capillary: 248 mg/dL — ABNORMAL HIGH (ref 70–99)
Glucose-Capillary: 280 mg/dL — ABNORMAL HIGH (ref 70–99)

## 2019-09-06 LAB — CBC WITH DIFFERENTIAL/PLATELET
Abs Immature Granulocytes: 0.22 10*3/uL — ABNORMAL HIGH (ref 0.00–0.07)
Basophils Absolute: 0 10*3/uL (ref 0.0–0.1)
Basophils Relative: 1 %
Eosinophils Absolute: 0 10*3/uL (ref 0.0–0.5)
Eosinophils Relative: 1 %
HCT: 34 % — ABNORMAL LOW (ref 36.0–46.0)
Hemoglobin: 11.7 g/dL — ABNORMAL LOW (ref 12.0–15.0)
Immature Granulocytes: 4 %
Lymphocytes Relative: 17 %
Lymphs Abs: 0.9 10*3/uL (ref 0.7–4.0)
MCH: 31.5 pg (ref 26.0–34.0)
MCHC: 34.4 g/dL (ref 30.0–36.0)
MCV: 91.6 fL (ref 80.0–100.0)
Monocytes Absolute: 0.5 10*3/uL (ref 0.1–1.0)
Monocytes Relative: 9 %
Neutro Abs: 3.9 10*3/uL (ref 1.7–7.7)
Neutrophils Relative %: 68 %
Platelets: 491 10*3/uL — ABNORMAL HIGH (ref 150–400)
RBC: 3.71 MIL/uL — ABNORMAL LOW (ref 3.87–5.11)
RDW: 12.9 % (ref 11.5–15.5)
WBC: 5.5 10*3/uL (ref 4.0–10.5)
nRBC: 0.4 % — ABNORMAL HIGH (ref 0.0–0.2)

## 2019-09-06 LAB — URINALYSIS, COMPLETE (UACMP) WITH MICROSCOPIC
Bacteria, UA: NONE SEEN
Bilirubin Urine: NEGATIVE
Glucose, UA: >=500 mg/dL — AB
Hgb urine dipstick: NEGATIVE
Ketones, ur: 20 mg/dL — AB
Leukocytes,Ua: NEGATIVE
Nitrite: NEGATIVE
Protein, ur: NEGATIVE mg/dL
Specific Gravity, Urine: 1.038 — ABNORMAL HIGH (ref 1.005–1.030)
pH: 6 (ref 5.0–8.0)

## 2019-09-06 LAB — BASIC METABOLIC PANEL
Anion gap: 18 — ABNORMAL HIGH (ref 5–15)
BUN: 9 mg/dL (ref 8–23)
CO2: 23 mmol/L (ref 22–32)
Calcium: 9.2 mg/dL (ref 8.9–10.3)
Chloride: 93 mmol/L — ABNORMAL LOW (ref 98–111)
Creatinine, Ser: 0.92 mg/dL (ref 0.44–1.00)
GFR calc Af Amer: >60 mL/min (ref >60)
GFR calc non Af Amer: >60 mL/min (ref >60)
Glucose, Bld: 386 mg/dL — ABNORMAL HIGH (ref 70–99)
Potassium: 3.2 mmol/L — ABNORMAL LOW (ref 3.5–5.1)
Sodium: 134 mmol/L — ABNORMAL LOW (ref 135–145)

## 2019-09-06 LAB — LACTIC ACID, PLASMA: Lactic Acid, Venous: 1.5 mmol/L (ref 0.5–1.9)

## 2019-09-06 LAB — BLOOD GAS, VENOUS
Acid-Base Excess: 3.5 mmol/L — ABNORMAL HIGH (ref 0.0–2.0)
Bicarbonate: 26.7 mmol/L (ref 20.0–28.0)
FIO2: 0.21
O2 Saturation: 95 %
Patient temperature: 37
pCO2, Ven: 35 mmHg — ABNORMAL LOW (ref 44.0–60.0)
pH, Ven: 7.49 — ABNORMAL HIGH (ref 7.250–7.430)
pO2, Ven: 69 mmHg — ABNORMAL HIGH (ref 32.0–45.0)

## 2019-09-06 MED ORDER — INSULIN ASPART 100 UNIT/ML ~~LOC~~ SOLN
6.0000 [IU] | Freq: Once | SUBCUTANEOUS | Status: AC
  Administered 2019-09-06: 6 [IU] via INTRAVENOUS
  Filled 2019-09-06: qty 1

## 2019-09-06 MED ORDER — GLIPIZIDE ER 10 MG PO TB24
10.0000 mg | ORAL_TABLET | Freq: Two times a day (BID) | ORAL | Status: DC
  Administered 2019-09-07 – 2019-09-10 (×8): 10 mg via ORAL
  Filled 2019-09-06 (×10): qty 1

## 2019-09-06 MED ORDER — LISINOPRIL 10 MG PO TABS: 10.0000 mg | ORAL_TABLET | Freq: Every day | ORAL | Status: DC

## 2019-09-06 MED ORDER — LEVOTHYROXINE SODIUM 25 MCG PO TABS
25.0000 ug | ORAL_TABLET | Freq: Every day | ORAL | Status: DC
  Administered 2019-09-07 – 2019-09-10 (×4): 25 ug via ORAL
  Filled 2019-09-06 (×5): qty 1

## 2019-09-06 MED ORDER — DEXAMETHASONE SODIUM PHOSPHATE 10 MG/ML IJ SOLN
6.0000 mg | Freq: Once | INTRAMUSCULAR | Status: DC
  Filled 2019-09-06: qty 1

## 2019-09-06 MED ORDER — POTASSIUM CHLORIDE IN NACL 20-0.9 MEQ/L-% IV SOLN
INTRAVENOUS | Status: DC
  Filled 2019-09-06 (×11): qty 1000

## 2019-09-06 MED ORDER — LOPERAMIDE HCL 2 MG PO CAPS: 2.0000 mg | ORAL_CAPSULE | Freq: Four times a day (QID) | ORAL | Status: DC | PRN

## 2019-09-06 MED ORDER — SODIUM CHLORIDE 0.9 % IV SOLN: Freq: Once | INTRAVENOUS | Status: AC

## 2019-09-06 MED ORDER — SODIUM CHLORIDE 0.9 % IV BOLUS
500.0000 mL | Freq: Once | INTRAVENOUS | Status: AC
  Administered 2019-09-06: 500 mL via INTRAVENOUS

## 2019-09-06 MED ORDER — FAMOTIDINE 20 MG PO TABS
20.0000 mg | ORAL_TABLET | Freq: Two times a day (BID) | ORAL | Status: DC
  Administered 2019-09-07 – 2019-09-10 (×8): 20 mg via ORAL
  Filled 2019-09-06 (×8): qty 1

## 2019-09-06 MED ORDER — ACETAMINOPHEN 325 MG PO TABS
650.0000 mg | ORAL_TABLET | Freq: Four times a day (QID) | ORAL | Status: DC | PRN
  Administered 2019-09-09: 650 mg via ORAL
  Filled 2019-09-06: qty 2

## 2019-09-06 MED ORDER — INSULIN ASPART 100 UNIT/ML ~~LOC~~ SOLN
SUBCUTANEOUS | Status: AC
  Filled 2019-09-06: qty 1

## 2019-09-06 MED ORDER — ACETAMINOPHEN 650 MG RE SUPP: 650.0000 mg | Freq: Four times a day (QID) | RECTAL | Status: DC | PRN

## 2019-09-06 MED ORDER — CALCIUM CARBONATE-VITAMIN D 500-200 MG-UNIT PO TABS
1.0000 | ORAL_TABLET | Freq: Every day | ORAL | Status: DC
  Administered 2019-09-07 – 2019-09-10 (×4): 1 via ORAL
  Filled 2019-09-06 (×4): qty 1

## 2019-09-06 MED ORDER — ONDANSETRON HCL 4 MG/2ML IJ SOLN: 4.0000 mg | Freq: Four times a day (QID) | INTRAMUSCULAR | Status: DC | PRN

## 2019-09-06 MED ORDER — INSULIN GLARGINE 100 UNIT/ML ~~LOC~~ SOLN
10.0000 [IU] | Freq: Every day | SUBCUTANEOUS | Status: DC
  Administered 2019-09-07: 10 [IU] via SUBCUTANEOUS
  Filled 2019-09-06 (×2): qty 0.1

## 2019-09-06 MED ORDER — ATORVASTATIN CALCIUM 20 MG PO TABS
20.0000 mg | ORAL_TABLET | Freq: Every day | ORAL | Status: DC
  Administered 2019-09-07 – 2019-09-10 (×4): 20 mg via ORAL
  Filled 2019-09-06 (×5): qty 1

## 2019-09-06 MED ORDER — APIXABAN 5 MG PO TABS
5.0000 mg | ORAL_TABLET | Freq: Two times a day (BID) | ORAL | Status: DC
  Administered 2019-09-07 – 2019-09-10 (×8): 5 mg via ORAL
  Filled 2019-09-06 (×8): qty 1

## 2019-09-06 MED ORDER — INSULIN ASPART 100 UNIT/ML ~~LOC~~ SOLN
0.0000 [IU] | Freq: Three times a day (TID) | SUBCUTANEOUS | Status: DC
  Administered 2019-09-06 – 2019-09-07 (×3): 5 [IU] via SUBCUTANEOUS
  Filled 2019-09-06 (×2): qty 1

## 2019-09-06 MED ORDER — ALBUTEROL SULFATE HFA 108 (90 BASE) MCG/ACT IN AERS
2.0000 | INHALATION_SPRAY | Freq: Once | RESPIRATORY_TRACT | Status: AC
  Administered 2019-09-06: 2 via RESPIRATORY_TRACT
  Filled 2019-09-06: qty 6.7

## 2019-09-06 MED ORDER — ONDANSETRON HCL 4 MG PO TABS: 4.0000 mg | ORAL_TABLET | Freq: Four times a day (QID) | ORAL | Status: DC | PRN

## 2019-09-06 NOTE — ED Notes (Signed)
See triage note  States she was tested positive for COVID 12 /22   States she is now having some diarrhea and vomiting  Last time vomited was early am   States she feel weak  Unsure of fever at home

## 2019-09-06 NOTE — ED Triage Notes (Signed)
Pt was dx with covid on 12/22, states she continues to have SOb with cough , N/V/D.Kimberly [name] pt sent from [facility] for low O2 sats, pt is 94-96% in triage, in NAD at present.Kimberly [name]

## 2019-09-06 NOTE — ED Notes (Signed)
Ambulated pt around nursing station  o2 sats 97 %   Pulse rate 127  Dr Mathews Robinsons aware

## 2019-09-06 NOTE — ED Notes (Signed)
Pt up to bathroom with assistance 

## 2019-09-06 NOTE — H&P (Addendum)
History and Physical:    Kimberly [name]   [id number] DOB: 1955/12/16 DOA: 09/06/2019  Referring MD/provider: Merlyn Lot, MD PCP: System, Pcp Not In   Patient coming from: Home  Chief Complaint: Shortness of breath, vomiting and diarrhea  History of Present Illness:   Kimberly [name] is an 64 y.o. female with medical history significant for recent COVID-19 infection diagnosed on August 21, 2019, metastatic breast cancer, history of right upper extremity DVT on Eliquis, CKD stage III, hypertension, hyperlipidemia, type 2 diabetes mellitus, hypercholesterolemia, vertigo.  She presented to the hospital today because of nausea, vomiting, intermittent diarrhea, dry cough, shortness of breath.  She said her when she was hospitalized for COVID-19 infection she only had 2 doses of remdesivir and was discharged home because she was doing well.  However, after discharge from the hospital, she has continued to have dry cough and intermittent shortness of breath.  She also has significant nausea and has had intermittent vomiting and diarrhea.  She has not been eating or drinking well because of symptoms.  She vomited twice this morning.  She also feels generally weak.  She is not had any fever.  She said that even prior to having the Covid infection she was having intermittent diarrhea and she attributes this to chemotherapy.  She had diarrhea yesterday but she did not have diarrhea today.  She says she stopped taking the chemotherapy about a week ago to allow her to build her immune system as recommended by her oncologist.  No fever, chills, chest pain, abdominal pain or urinary symptoms.  ED Course:  The patient afebrile and had normal oxygen saturation but she was tachycardic and this was worse with standing.  Heart rate was up to 127.  He has mild hyponatremia and hypokalemia.  GI space opacity left mid and lower lung zones consistent with multifocal pneumonia.  No frank consolidation was  noted.  ROS:   ROS all other systems reviewed were negative  Past Medical History:   Past Medical History:  Diagnosis Date  . Arthritis   . Breast cancer ([place])    current in between breast   . Cancer of lower-outer quadrant of female breast ([place]) 08/06/2011   RIGHT   . High cholesterol   . History of kidney stones   . Hx of bilateral breast implants   . Hypertension   . PONV (postoperative nausea and vomiting)   . Type II diabetes mellitus ([facility])    fasting 140-150  . Vertigo    LAST WEEK  . Vertigo 01/2017    Past Surgical History:   Past Surgical History:  Procedure Laterality Date  . BREAST BIOPSY  07/2011, 2020   right  . BREAST RECONSTRUCTION  03/07/2012   Procedure: BREAST RECONSTRUCTION;  Surgeon: Crissie Reese, MD;  Location: [facility];  Service: Plastics;  Laterality: Left;  BREAST RECONSTRUCTION WITH PLACEMENT OF TISSUE EXPANDER TO LEFT BREAST  . BREAST RECONSTRUCTION  02/06/2013  . CESAREAN SECTION  [id number]  . ENDOBRONCHIAL ULTRASOUND N/A 02/24/2017   Procedure: ENDOBRONCHIAL ULTRASOUND;  Surgeon: [name], MD;  Location: [facility];  Service: Cardiopulmonary;  Laterality: N/A;  . LATISSIMUS FLAP TO BREAST Right 02/06/2013   Procedure: LATISSIMUS FLAP TO RIGHT BREAST WITH IMPLANT;  Surgeon: Crissie Reese, MD;  Location: [facility];  Service: Plastics;  Laterality: Right;  . MASTECTOMY  03/07/12   modified right; total left  . MODIFIED MASTECTOMY  03/07/2012   Procedure: MODIFIED MASTECTOMY;  Surgeon: Haywood Lasso, MD;  Location: [facility];  Service: General;  Laterality: Right;  . PORTACATH PLACEMENT  07/2011  . SCAR REVISION  03/30/2012   Procedure: SCAR REVISION;  Surgeon: Haywood Lasso, MD;  Location: [facility];  Service: General;  Laterality: Right;  CLOSURE OF MASTECTOMY INCISION  . WISDOM TOOTH EXTRACTION      Social History:   Social History   Socioeconomic History  . Marital status: Married    Spouse name: Not on file  . Number of  children: Not on file  . Years of education: Not on file  . Highest education level: Not on file  Occupational History  . Not on file  Tobacco Use  . Smoking status: Never Smoker  . Smokeless tobacco: Never Used  Substance and Sexual Activity  . Alcohol use: No  . Drug use: No  . Sexual activity: Yes  Other Topics Concern  . Not on file  Social History Narrative  . Not on file   Social Determinants of Health   Financial Resource Strain:   . Difficulty of Paying Living Expenses: Not on file  Food Insecurity:   . Worried About Charity fundraiser in the Last Year: Not on file  . Ran Out of Food in the Last Year: Not on file  Transportation Needs:   . Lack of Transportation (Medical): Not on file  . Lack of Transportation (Non-Medical): Not on file  Physical Activity:   . Days of Exercise per Week: Not on file  . Minutes of Exercise per Session: Not on file  Stress:   . Feeling of Stress : Not on file  Social Connections:   . Frequency of Communication with Friends and Family: Not on file  . Frequency of Social Gatherings with Friends and Family: Not on file  . Attends Religious Services: Not on file  . Active Member of Clubs or Organizations: Not on file  . Attends Archivist Meetings: Not on file  . Marital Status: Not on file  Intimate Partner Violence:   . Fear of Current or Ex-Partner: Not on file  . Emotionally Abused: Not on file  . Physically Abused: Not on file  . Sexually Abused: Not on file    Allergies   Sulfa antibiotics  Family history:   Family History  Problem Relation Age of Onset  . Breast cancer Mother   . Diabetes Father     Current Medications:   Prior to Admission medications   Medication Sig Start Date End Date Taking? Authorizing Provider  aluminum-magnesium hydroxide-simethicone (MAALOX) [id number] MG/5ML SUSP Take 30 mLs by mouth 4 (four) times daily -  before meals and at bedtime. 08/29/19  Yes Carrie Mew, MD   apixaban (ELIQUIS) 5 MG TABS tablet Take 1 tablet (5 mg total) by mouth 2 (two) times daily. 02/23/19  Yes [name], MD  atorvastatin (LIPITOR) 20 MG tablet Take 1 tablet (20 mg total) by mouth daily. 04/11/19  Yes Towanda Malkin, MD  Calcium Carbonate-Vitamin D (CALCIUM 600 + D PO) Take 1 tablet by mouth 2 (two) times daily.   Yes [provider]  famotidine (PEPCID) 20 MG tablet Take 1 tablet (20 mg total) by mouth 2 (two) times daily. 08/29/19  Yes Carrie Mew, MD  fulvestrant (FASLODEX) 250 MG/5ML injection Inject into the muscle every 30 (thirty) days. One injection each buttock over 1-2 minutes. Warm prior to use.   Yes [provider]  glipiZIDE (GLUCOTROL XL) 10 MG 24 hr  tablet Take 1 tablet (10 mg total) by mouth 2 (two) times daily. 04/11/19  Yes Towanda Malkin, MD  levothyroxine (SYNTHROID) 25 MCG tablet TAKE 1 TABLET (25 MCG TOTAL) BY MOUTH DAILY BEFORE BREAKFAST. 05/14/19  Yes [name], MD  lisinopril (ZESTRIL) 10 MG tablet Take 1 tablet (10 mg total) by mouth every morning. 04/11/19  Yes Towanda Malkin, MD  mupirocin ointment (BACTROBAN) 2 % Apply 1 application topically 3 (three) times daily. 02/18/19  Yes Melynda Ripple, MD  Semaglutide,0.25 or 0.5MG /DOS, (OZEMPIC, 0.25 OR 0.5 MG/DOSE,) 2 MG/1.5ML SOPN Inject 0.25 mg into the skin once a week. 04/11/19  Yes Towanda Malkin, MD  VERZENIO 100 MG tablet TAKE 1 TABLET BY MOUTH TWICE DAILY 06/29/19  Yes Verlon Au, NP  betamethasone valerate (VALISONE) 0.1 % cream Apply 0.1 application topically as needed. 06/08/19   [provider]  loperamide (IMODIUM A-D) 2 MG tablet Take 2 mg by mouth 4 (four) times daily as needed for diarrhea or loose stools.    [provider]  ondansetron (ZOFRAN) 8 MG tablet TAKE 1 TABLET (8 MG TOTAL) BY MOUTH EVERY 8 (EIGHT) HOURS AS NEEDED FOR NAUSEA OR VOMITING. 08/20/19   [name], MD   promethazine (PHENERGAN) 25 MG tablet Take 1 tablet (25 mg total) by mouth every 6 (six) hours as needed for nausea or vomiting. 08/20/19   Lloyd Huger, MD    Physical Exam:   Vitals:   09/06/19 0856 09/06/19 0900 09/06/19 1347  BP: 124/67  120/70  Pulse: (!) 108  (!) 127  Resp: 18  20  Temp: 98.6 F (37 C) 98.6 F (37 C)   TempSrc: Oral Oral   SpO2: 95%  97%  Weight: 72.6 kg    Height: 5\' 2"  (1.575 m)       Physical Exam: Blood pressure 120/70, pulse (!) 127, temperature 98.6 F (37 C), temperature source Oral, resp. rate 20, height 5\' 2"  (1.575 m), weight 72.6 kg, SpO2 97 %. Gen: No acute distress. Head: Normocephalic, atraumatic. Eyes: Pupils equal, round and reactive to light. Extraocular movements intact.  Sclerae nonicteric.  Mouth: Dry mucous membranes. Neck: Supple, no thyromegaly, no lymphadenopathy, no jugular venous distention. Chest: Lungs are clear to auscultation with good air movement. No rales, rhonchi or wheezes.  CV: Heart sounds are regular with an S1, S2. No murmurs, rubs or gallops.  Abdomen: Soft, nontender, nondistended with normal active bowel sounds. No palpable masses. Extremities: Extremities are without clubbing, or cyanosis. No edema. Pedal pulses 2+.  Skin: Warm and dry. No rashes, lesions or wounds.  Bilateral mastectomy Neuro: Alert and oriented times 3; grossly nonfocal.  Psych: Insight is good and judgment is appropriate. Mood and affect normal.   Data Review:    Labs: Basic Metabolic Panel: Recent Labs  Lab 09/06/19 0903  NA 134*  K 3.2*  CL 93*  CO2 23  GLUCOSE 386*  BUN 9  CREATININE 0.92  CALCIUM 9.2   Liver Function Tests: No results for input(s): AST, ALT, ALKPHOS, BILITOT, PROT, ALBUMIN in the last 168 hours. No results for input(s): LIPASE, AMYLASE in the last 168 hours. No results for input(s): AMMONIA in the last 168 hours. CBC: Recent Labs  Lab 09/06/19 0903  WBC 5.5  NEUTROABS 3.9  HGB 11.7*  HCT  34.0*  MCV 91.6  PLT 491*   Cardiac Enzymes: No results for input(s): CKTOTAL, CKMB, CKMBINDEX, TROPONINI in the last 168 hours.  BNP (last  3 results) No results for input(s): PROBNP in the last 8760 hours. CBG: No results for input(s): GLUCAP in the last 168 hours.  Urinalysis    Component Value Date/Time   COLORURINE YELLOW (A) 09/06/2019 1230   APPEARANCEUR HAZY (A) 09/06/2019 1230   APPEARANCEUR CLEAR 08/14/2012 1405   LABSPEC 1.038 (H) 09/06/2019 1230   LABSPEC >=1.030 08/14/2012 1405   PHURINE 6.0 09/06/2019 1230   GLUCOSEU >=500 (A) 09/06/2019 1230   GLUCOSEU NEGATIVE 08/14/2012 1405   HGBUR NEGATIVE 09/06/2019 1230   BILIRUBINUR NEGATIVE 09/06/2019 1230   BILIRUBINUR NEGATIVE 08/14/2012 1405   KETONESUR 20 (A) 09/06/2019 1230   PROTEINUR NEGATIVE 09/06/2019 1230   NITRITE NEGATIVE 09/06/2019 1230   LEUKOCYTESUR NEGATIVE 09/06/2019 1230   LEUKOCYTESUR NEGATIVE 08/14/2012 1405      Radiographic Studies: DG Chest 2 View  Result Date: 09/06/2019 CLINICAL DATA:  Shortness of breath.  Recent COVID-19 positive EXAM: CHEST - 2 VIEW COMPARISON:  August 21, 2019 FINDINGS: There is patchy airspace opacity in the left mid and lower lung zones. Right lung is clear. Heart size and pulmonary vascularity are normal. No adenopathy. Postoperative change noted in each breast region. IMPRESSION: Patchy airspace opacity left mid and lower lung zones, consistent with multifocal pneumonia. No frank consolidation. Suspect atypical organism pneumonia. Cardiac silhouette normal. No adenopathy. Postoperative changes in each breast region. Electronically Signed   By: Lowella Grip III M.D.   On: 09/06/2019 09:55    EKG: Independently reviewed.  Sinus tachycardia, LAD   Assessment/Plan:   Active Problems:   Malignant tumor of breast (HCC)   Generalized weakness   Hypokalemia    Body mass index is 29.26 kg/m.    Generalized weakness, hyponatremia and dehydration: Admit to  MedSurg for observation.  Treat with IV fluids.  Sinus tachycardia: Probably due to dehydration.  Hypokalemia: Replete potassium.  Check magnesium and phosphorus level  Nausea vomiting and diarrhea: Supportive treatment.  Antiemetics as needed.  Metastatic breast cancer: Outpatient follow-up with oncologist.  Type 2 diabetes mellitus with severe hyperglycemia: Semaglutide is nonformulary but she says she took semaglutide today prior to admission.  Continue glipizide.  NovoLog as needed for hyperglycemia.  Low-dose Lantus will be prescribed  History of right upper extremity DVT: Continue Eliquis  Hypertension: Hold lisinopril for now  Recent COVID-19 infection diagnosed on August 21, 2019: Patient is not a candidate for remdesivir or dexamethasone at this time.  Multi focal infection noted on chest x-ray is likely due to old infection.  Continue to monitor  Immunocompromised state  Other information:   DVT prophylaxis: Eliquis Code Status: DNR. Family Communication: Plan discussed with patient Disposition Plan: Possible discharge to home in 1 to 2 days Consults called: None Admission status: Observation    The medical decision making is of moderate complexity, therefore this is a level 2 visit.  Time spent 60 minutes  [place] [facility]   How to contact the [facility] Attending or Consulting provider [place] or covering provider during after hours [place], for this patient?   1. Check the care team in [facility] and look for a) attending/consulting [facility] provider listed and b) the [facility] team listed 2. Log into [email or web address] and use 's universal password to access. If you do not have the password, please contact the hospital operator. 3. Locate the [facility] provider you are looking for under [facility] and page to a number that you can be directly reached. 4. If you still have difficulty reaching  the provider, please page the [place] (Director on Call) for the  Hospitalists listed on amion for assistance.  09/06/2019, 3:32 PM

## 2019-09-06 NOTE — ED Provider Notes (Signed)
[facility] Emergency Department Provider Note    First MD Initiated Contact with Patient 09/06/19 1201     (approximate)  I have reviewed the triage vital signs and the nursing notes.   HISTORY  Chief Complaint Shortness of Breath    HPI Kimberly [name] is a 64 y.o. female   with the below listed past medical history not on home oxygen presents the ER for evaluation of diarrhea generalized malaise shortness of breath and cough.  Patient is recently diagnosed with Covid the end of last month.  Was briefly admitted and received 2 rounds of remdesivir was discharged home.  She is on Eliquis and has been compliant with the has a history of breast cancer and they are currently holding her chemotherapy.  Her husband checked into walk-in clinic yesterday with similar symptoms was given a shot of steroids and the patient was hoping to get similar treatment today but when she checked and her oxygen level was reportedly 90 and sent over here.  On arrival ER triage her O2 sats are 95%.  She states that she does have exertional dyspnea.  She does not wear home oxygen.   Past Medical History:  Diagnosis Date  . Arthritis   . Breast cancer ([place])    current in between breast   . Cancer of lower-outer quadrant of female breast ([place]) 08/06/2011   RIGHT   . High cholesterol   . History of kidney stones   . Hx of bilateral breast implants   . Hypertension   . PONV (postoperative nausea and vomiting)   . Type II diabetes mellitus ([facility])    fasting 140-150  . Vertigo    LAST WEEK  . Vertigo 01/2017   Family History  Problem Relation Age of Onset  . Breast cancer Mother   . Diabetes Father    Past Surgical History:  Procedure Laterality Date  . BREAST BIOPSY  07/2011, 2020   right  . BREAST RECONSTRUCTION  03/07/2012   Procedure: BREAST RECONSTRUCTION;  Surgeon: Crissie Reese, MD;  Location: [place];  Service: Plastics;  Laterality: Left;  BREAST RECONSTRUCTION WITH  PLACEMENT OF TISSUE EXPANDER TO LEFT BREAST  . BREAST RECONSTRUCTION  02/06/2013  . CESAREAN SECTION  [id number]  . ENDOBRONCHIAL ULTRASOUND N/A 02/24/2017   Procedure: ENDOBRONCHIAL ULTRASOUND;  Surgeon: [name], MD;  Location: [facility];  Service: Cardiopulmonary;  Laterality: N/A;  . LATISSIMUS FLAP TO BREAST Right 02/06/2013   Procedure: LATISSIMUS FLAP TO RIGHT BREAST WITH IMPLANT;  Surgeon: Crissie Reese, MD;  Location: [place];  Service: Plastics;  Laterality: Right;  . MASTECTOMY  03/07/12   modified right; total left  . MODIFIED MASTECTOMY  03/07/2012   Procedure: MODIFIED MASTECTOMY;  Surgeon: Haywood Lasso, MD;  Location: [place];  Service: General;  Laterality: Right;  . PORTACATH PLACEMENT  07/2011  . SCAR REVISION  03/30/2012   Procedure: SCAR REVISION;  Surgeon: Haywood Lasso, MD;  Location: [place];  Service: General;  Laterality: Right;  CLOSURE OF MASTECTOMY INCISION  . WISDOM TOOTH EXTRACTION     Patient Active Problem List   Diagnosis Date Noted  . Generalized weakness 09/06/2019  . Hypokalemia 09/06/2019  . COVID-19 08/21/2019  . Hypercholesterolemia 10/27/2018  . Thyroid dysfunction 10/27/2018  . Malignant tumor of breast ([place]) 10/20/2018  . Osteopenia 10/20/2018  . Pain and swelling of right upper extremity 04/11/2018  . Chest pain 02/04/2018  . Vertigo 03/08/2017  . Adenopathy   .  Mediastinal adenopathy 01/25/2017  . Diabetes mellitus ([place]) 03/24/2012  . Carcinoma of lower-outer quadrant of right breast in female, estrogen receptor positive ([place]) 08/06/2011      Prior to Admission medications   Medication Sig Start Date End Date Taking? Authorizing Provider  aluminum-magnesium hydroxide-simethicone (MAALOX) [id number] MG/5ML SUSP Take 30 mLs by mouth 4 (four) times daily -  before meals and at bedtime. 08/29/19  Yes Carrie Mew, MD  apixaban (ELIQUIS) 5 MG TABS tablet Take 1 tablet (5 mg total) by mouth 2 (two) times daily.  02/23/19  Yes [name], MD  atorvastatin (LIPITOR) 20 MG tablet Take 1 tablet (20 mg total) by mouth daily. 04/11/19  Yes Towanda Malkin, MD  Calcium Carbonate-Vitamin D (CALCIUM 600 + D PO) Take 1 tablet by mouth 2 (two) times daily.   Yes [provider]  famotidine (PEPCID) 20 MG tablet Take 1 tablet (20 mg total) by mouth 2 (two) times daily. 08/29/19  Yes Carrie Mew, MD  fulvestrant (FASLODEX) 250 MG/5ML injection Inject into the muscle every 30 (thirty) days. One injection each buttock over 1-2 minutes. Warm prior to use.   Yes [provider]  glipiZIDE (GLUCOTROL XL) 10 MG 24 hr tablet Take 1 tablet (10 mg total) by mouth 2 (two) times daily. 04/11/19  Yes Towanda Malkin, MD  levothyroxine (SYNTHROID) 25 MCG tablet TAKE 1 TABLET (25 MCG TOTAL) BY MOUTH DAILY BEFORE BREAKFAST. 05/14/19  Yes [name], MD  lisinopril (ZESTRIL) 10 MG tablet Take 1 tablet (10 mg total) by mouth every morning. 04/11/19  Yes Towanda Malkin, MD  mupirocin ointment (BACTROBAN) 2 % Apply 1 application topically 3 (three) times daily. 02/18/19  Yes Melynda Ripple, MD  Semaglutide,0.25 or 0.5MG /DOS, (OZEMPIC, 0.25 OR 0.5 MG/DOSE,) 2 MG/1.5ML SOPN Inject 0.25 mg into the skin once a week. 04/11/19  Yes Towanda Malkin, MD  VERZENIO 100 MG tablet TAKE 1 TABLET BY MOUTH TWICE DAILY 06/29/19  Yes Verlon Au, NP  betamethasone valerate (VALISONE) 0.1 % cream Apply 0.1 application topically as needed. 06/08/19   [provider]  loperamide (IMODIUM A-D) 2 MG tablet Take 2 mg by mouth 4 (four) times daily as needed for diarrhea or loose stools.    [provider]  ondansetron (ZOFRAN) 8 MG tablet TAKE 1 TABLET (8 MG TOTAL) BY MOUTH EVERY 8 (EIGHT) HOURS AS NEEDED FOR NAUSEA OR VOMITING. 08/20/19   [name], MD  promethazine (PHENERGAN) 25 MG tablet Take 1 tablet (25 mg total) by mouth every 6 (six) hours as needed  for nausea or vomiting. 08/20/19   Lloyd Huger, MD    Allergies Sulfa antibiotics    Social History Social History   Tobacco Use  . Smoking status: Never Smoker  . Smokeless tobacco: Never Used  Substance Use Topics  . Alcohol use: No  . Drug use: No    Review of Systems Patient denies headaches, rhinorrhea, blurry vision, numbness, shortness of breath, chest pain, edema, cough, abdominal pain, nausea, vomiting, diarrhea, dysuria, fevers, rashes or hallucinations unless otherwise stated above in HPI. ____________________________________________   PHYSICAL EXAM:  VITAL SIGNS: Vitals:   09/06/19 1545 09/06/19 1600  BP:  125/71  Pulse:  96  Resp: (!) 22 (!) 24  Temp:    SpO2:  95%    Constitutional: Alert and oriented.  Eyes: Conjunctivae are normal.  Head: Atraumatic. Nose: No congestion/rhinnorhea. Mouth/Throat: Mucous membranes are moist.   Neck: No stridor. Painless  ROM.  Cardiovascular: Normal rate, regular rhythm. Grossly normal heart sounds.  Good peripheral circulation. Respiratory: Normal respiratory effort.  No retractions. Lungs CTAB. Gastrointestinal: Soft and nontender. No distention. No abdominal bruits. No CVA tenderness. Genitourinary:  Musculoskeletal: No lower extremity tenderness nor edema.  No joint effusions. Neurologic:  Normal speech and language. No gross focal neurologic deficits are appreciated. No facial droop Skin:  Skin is warm, dry and intact. No rash noted. Psychiatric: Mood and affect are normal. Speech and behavior are normal.  ____________________________________________   LABS (all labs ordered are listed, but only abnormal results are displayed)  Results for orders placed or performed during the hospital encounter of 09/06/19 (from the past 24 hour(s))  Basic metabolic panel     Status: Abnormal   Collection Time: 09/06/19  9:03 AM  Result Value Ref Range   Sodium 134 (L) 135 - 145 mmol/L   Potassium 3.2 (L) 3.5 -  5.1 mmol/L   Chloride 93 (L) 98 - 111 mmol/L   CO2 23 22 - 32 mmol/L   Glucose, Bld 386 (H) 70 - 99 mg/dL   BUN 9 8 - 23 mg/dL   Creatinine, Ser 0.92 0.44 - 1.00 mg/dL   Calcium 9.2 8.9 - 10.3 mg/dL   GFR calc non Af Amer >60 >60 mL/min   GFR calc Af Amer >60 >60 mL/min   Anion gap 18 (H) 5 - 15  CBC with Differential     Status: Abnormal   Collection Time: 09/06/19  9:03 AM  Result Value Ref Range   WBC 5.5 4.0 - 10.5 K/uL   RBC 3.71 (L) 3.87 - 5.11 MIL/uL   Hemoglobin 11.7 (L) 12.0 - 15.0 g/dL   HCT 34.0 (L) 36.0 - 46.0 %   MCV 91.6 80.0 - 100.0 fL   MCH 31.5 26.0 - 34.0 pg   MCHC 34.4 30.0 - 36.0 g/dL   RDW 12.9 11.5 - 15.5 %   Platelets 491 (H) 150 - 400 K/uL   nRBC 0.4 (H) 0.0 - 0.2 %   Neutrophils Relative % 68 %   Neutro Abs 3.9 1.7 - 7.7 K/uL   Lymphocytes Relative 17 %   Lymphs Abs 0.9 0.7 - 4.0 K/uL   Monocytes Relative 9 %   Monocytes Absolute 0.5 0.1 - 1.0 K/uL   Eosinophils Relative 1 %   Eosinophils Absolute 0.0 0.0 - 0.5 K/uL   Basophils Relative 1 %   Basophils Absolute 0.0 0.0 - 0.1 K/uL   Immature Granulocytes 4 %   Abs Immature Granulocytes 0.22 (H) 0.00 - 0.07 K/uL  Lactic acid, plasma     Status: None   Collection Time: 09/06/19 12:30 PM  Result Value Ref Range   Lactic Acid, Venous 1.5 0.5 - 1.9 mmol/L  Blood gas, venous     Status: Abnormal   Collection Time: 09/06/19 12:30 PM  Result Value Ref Range   FIO2 0.21    pH, Ven 7.49 (H) 7.250 - 7.430   pCO2, Ven 35 (L) 44.0 - 60.0 mmHg   pO2, Ven 69.0 (H) 32.0 - 45.0 mmHg   Bicarbonate 26.7 20.0 - 28.0 mmol/L   Acid-Base Excess 3.5 (H) 0.0 - 2.0 mmol/L   O2 Saturation 95.0 %   Patient temperature 37.0    Collection site VENOUS    Sample type VENOUS   Urinalysis, Complete w Microscopic     Status: Abnormal   Collection Time: 09/06/19 12:30 PM  Result Value Ref Range   Color,  Urine YELLOW (A) YELLOW   APPearance HAZY (A) CLEAR   Specific Gravity, Urine 1.038 (H) 1.005 - 1.030   pH 6.0 5.0 -  8.0   Glucose, UA >=500 (A) NEGATIVE mg/dL   Hgb urine dipstick NEGATIVE NEGATIVE   Bilirubin Urine NEGATIVE NEGATIVE   Ketones, ur 20 (A) NEGATIVE mg/dL   Protein, ur NEGATIVE NEGATIVE mg/dL   Nitrite NEGATIVE NEGATIVE   Leukocytes,Ua NEGATIVE NEGATIVE   WBC, UA 6-10 0 - 5 WBC/hpf   Bacteria, UA NONE SEEN NONE SEEN   Squamous Epithelial / LPF 0-5 0 - 5   Mucus PRESENT   Glucose, capillary     Status: Abnormal   Collection Time: 09/06/19  3:32 PM  Result Value Ref Range   Glucose-Capillary 290 (H) 70 - 99 mg/dL   Comment 1 Call MD NNP PA CNM   Glucose, capillary     Status: Abnormal   Collection Time: 09/06/19  4:57 PM  Result Value Ref Range   Glucose-Capillary 248 (H) 70 - 99 mg/dL   ____________________________________________  EKG My review and personal interpretation at Time: 9:00   Indication: weakness  Rate: 110  Rhythm: sinus Axis: normal Other: borderline prolonged qt, no stemi, no depressions ____________________________________________  RADIOLOGY  I personally reviewed all radiographic images ordered to evaluate for the above acute complaints and reviewed radiology reports and findings.  These findings were personally discussed with the patient.  Please see medical record for radiology report.  ____________________________________________   PROCEDURES  Procedure(s) performed:  Procedures    Critical Care performed:  no ____________________________________________   INITIAL IMPRESSION / ASSESSMENT AND PLAN / ED COURSE  Pertinent labs & imaging results that were available during my care of the patient were reviewed by me and considered in my medical decision making (see chart for details).   DDX: dehdyration, sepsi, covid, po, pna, ptx, electrolytre ab  ELLIANNA DUDZINSKI is a 64 y.o. who presents to the ED with symptoms as described above.  Patient frail and weak appearing does have mild tachycardia but no respiratory distress.  She not have any hypoxia  on arrival to the ER.  Does appear clinically dehydrated.  The patient will be placed on continuous pulse oximetry and telemetry for monitoring.  Laboratory evaluation will be sent to evaluate for the above complaints.     Clinical Course as of Sep 05 1933  Thu Sep 06, 2019  1350 Patient does become short of breath with ambulation does not actually become hypoxic but tachycardic into the 130s.  Feels weak and fatigued.  Will give additional IV fluids.  Not convinced that this is pneumonia given lack of fever or hypoxia.  Will give IV fluids.  She felt somewhat better after albuterol.  Will trial dose of Decadron to see if that also helps.  Given her weakness and frailty with comorbidities will discuss with hospitalist for observation.   [PR]    Clinical Course User Index [PR] Merlyn Lot, MD    The patient was evaluated in Emergency Department today for the symptoms described in the history of present illness. He/she was evaluated in the context of the global COVID-19 pandemic, which necessitated consideration that the patient might be at risk for infection with the SARS-CoV-2 virus that causes COVID-19. Institutional protocols and algorithms that pertain to the evaluation of patients at risk for COVID-19 are in a state of rapid change based on information released by regulatory bodies including the CDC and federal and state organizations. These policies  and algorithms were followed during the patient's care in the ED.  As part of my medical decision making, I reviewed the following data within the [facility] notes reviewed and incorporated, Labs reviewed, notes from prior ED visits and [place] Controlled Substance Database   ____________________________________________   FINAL CLINICAL IMPRESSION(S) / ED DIAGNOSES  Final diagnoses:  Dehydration  SOB (shortness of breath)      NEW MEDICATIONS STARTED DURING THIS VISIT:  New Prescriptions   No medications on  file     Note:  This document was prepared using Dragon voice recognition software and may include unintentional dictation errors.    Merlyn Lot, MD 09/06/19 [phone]

## 2019-09-06 NOTE — Telephone Encounter (Signed)
Patient called reporting that she is in the ER for the third time and that she does not feel anyone is helping her, "they give me fluids and send me home, but no one is doing anything to make me better" She is asking if there is something that we can do to help her get over COVID Please advise, she is very tearful

## 2019-09-06 NOTE — ED Notes (Signed)
Report called to [facility] RN  Pt moved to room 31

## 2019-09-07 ENCOUNTER — Other Ambulatory Visit: Payer: 59

## 2019-09-07 ENCOUNTER — Ambulatory Visit: Payer: 59

## 2019-09-07 ENCOUNTER — Telehealth: Payer: Self-pay | Admitting: Internal Medicine

## 2019-09-07 ENCOUNTER — Encounter: Payer: Self-pay | Admitting: Internal Medicine

## 2019-09-07 ENCOUNTER — Inpatient Hospital Stay: Payer: 59

## 2019-09-07 ENCOUNTER — Ambulatory Visit: Payer: 59 | Admitting: Internal Medicine

## 2019-09-07 DIAGNOSIS — E1165 Type 2 diabetes mellitus with hyperglycemia: Secondary | ICD-10-CM | POA: Diagnosis present

## 2019-09-07 DIAGNOSIS — E039 Hypothyroidism, unspecified: Secondary | ICD-10-CM | POA: Diagnosis present

## 2019-09-07 DIAGNOSIS — C50919 Malignant neoplasm of unspecified site of unspecified female breast: Secondary | ICD-10-CM | POA: Diagnosis present

## 2019-09-07 DIAGNOSIS — N183 Chronic kidney disease, stage 3 unspecified: Secondary | ICD-10-CM | POA: Diagnosis present

## 2019-09-07 DIAGNOSIS — E1122 Type 2 diabetes mellitus with diabetic chronic kidney disease: Secondary | ICD-10-CM | POA: Diagnosis present

## 2019-09-07 DIAGNOSIS — E7800 Pure hypercholesterolemia, unspecified: Secondary | ICD-10-CM | POA: Diagnosis present

## 2019-09-07 DIAGNOSIS — Z66 Do not resuscitate: Secondary | ICD-10-CM | POA: Diagnosis present

## 2019-09-07 DIAGNOSIS — R471 Dysarthria and anarthria: Secondary | ICD-10-CM

## 2019-09-07 DIAGNOSIS — D849 Immunodeficiency, unspecified: Secondary | ICD-10-CM | POA: Diagnosis present

## 2019-09-07 DIAGNOSIS — D631 Anemia in chronic kidney disease: Secondary | ICD-10-CM | POA: Diagnosis present

## 2019-09-07 DIAGNOSIS — Z794 Long term (current) use of insulin: Secondary | ICD-10-CM

## 2019-09-07 DIAGNOSIS — M1990 Unspecified osteoarthritis, unspecified site: Secondary | ICD-10-CM | POA: Diagnosis present

## 2019-09-07 DIAGNOSIS — E860 Dehydration: Secondary | ICD-10-CM | POA: Diagnosis present

## 2019-09-07 DIAGNOSIS — R112 Nausea with vomiting, unspecified: Secondary | ICD-10-CM | POA: Diagnosis not present

## 2019-09-07 DIAGNOSIS — E876 Hypokalemia: Secondary | ICD-10-CM | POA: Diagnosis present

## 2019-09-07 DIAGNOSIS — D638 Anemia in other chronic diseases classified elsewhere: Secondary | ICD-10-CM

## 2019-09-07 DIAGNOSIS — J181 Lobar pneumonia, unspecified organism: Secondary | ICD-10-CM | POA: Diagnosis present

## 2019-09-07 DIAGNOSIS — E871 Hypo-osmolality and hyponatremia: Secondary | ICD-10-CM | POA: Diagnosis present

## 2019-09-07 DIAGNOSIS — Z8616 Personal history of COVID-19: Secondary | ICD-10-CM | POA: Diagnosis not present

## 2019-09-07 DIAGNOSIS — E119 Type 2 diabetes mellitus without complications: Secondary | ICD-10-CM | POA: Diagnosis not present

## 2019-09-07 DIAGNOSIS — I129 Hypertensive chronic kidney disease with stage 1 through stage 4 chronic kidney disease, or unspecified chronic kidney disease: Secondary | ICD-10-CM | POA: Diagnosis present

## 2019-09-07 DIAGNOSIS — C799 Secondary malignant neoplasm of unspecified site: Secondary | ICD-10-CM | POA: Diagnosis present

## 2019-09-07 LAB — BASIC METABOLIC PANEL
Anion gap: 9 (ref 5–15)
BUN: 8 mg/dL (ref 8–23)
CO2: 24 mmol/L (ref 22–32)
Calcium: 7.8 mg/dL — ABNORMAL LOW (ref 8.9–10.3)
Chloride: 102 mmol/L (ref 98–111)
Creatinine, Ser: 0.69 mg/dL (ref 0.44–1.00)
GFR calc Af Amer: >60 mL/min (ref >60)
GFR calc non Af Amer: >60 mL/min (ref >60)
Glucose, Bld: 251 mg/dL — ABNORMAL HIGH (ref 70–99)
Potassium: 3.4 mmol/L — ABNORMAL LOW (ref 3.5–5.1)
Sodium: 135 mmol/L (ref 135–145)

## 2019-09-07 LAB — CBC
HCT: 27.9 % — ABNORMAL LOW (ref 36.0–46.0)
Hemoglobin: 9.4 g/dL — ABNORMAL LOW (ref 12.0–15.0)
MCH: 31.4 pg (ref 26.0–34.0)
MCHC: 33.7 g/dL (ref 30.0–36.0)
MCV: 93.3 fL (ref 80.0–100.0)
Platelets: 356 10*3/uL (ref 150–400)
RBC: 2.99 MIL/uL — ABNORMAL LOW (ref 3.87–5.11)
RDW: 13.1 % (ref 11.5–15.5)
WBC: 4.2 10*3/uL (ref 4.0–10.5)
nRBC: 0 % (ref 0.0–0.2)

## 2019-09-07 LAB — GLUCOSE, CAPILLARY
Glucose-Capillary: 219 mg/dL — ABNORMAL HIGH (ref 70–99)
Glucose-Capillary: 244 mg/dL — ABNORMAL HIGH (ref 70–99)
Glucose-Capillary: 378 mg/dL — ABNORMAL HIGH (ref 70–99)
Glucose-Capillary: 397 mg/dL — ABNORMAL HIGH (ref 70–99)

## 2019-09-07 LAB — MAGNESIUM: Magnesium: 1.5 mg/dL — ABNORMAL LOW (ref 1.7–2.4)

## 2019-09-07 LAB — PHOSPHORUS: Phosphorus: 3.6 mg/dL (ref 2.5–4.6)

## 2019-09-07 MED ORDER — SODIUM CHLORIDE 0.9 % IV SOLN
2.0000 g | INTRAVENOUS | Status: DC
  Administered 2019-09-07 – 2019-09-09 (×3): 2 g via INTRAVENOUS
  Filled 2019-09-07 (×2): qty 2
  Filled 2019-09-07: qty 20
  Filled 2019-09-07: qty 2

## 2019-09-07 MED ORDER — INSULIN GLARGINE 100 UNIT/ML ~~LOC~~ SOLN
15.0000 [IU] | Freq: Every day | SUBCUTANEOUS | Status: DC
  Administered 2019-09-07: 15 [IU] via SUBCUTANEOUS
  Filled 2019-09-07 (×2): qty 0.15

## 2019-09-07 MED ORDER — INSULIN ASPART 100 UNIT/ML ~~LOC~~ SOLN
0.0000 [IU] | Freq: Every day | SUBCUTANEOUS | Status: DC
  Administered 2019-09-07 – 2019-09-09 (×3): 5 [IU] via SUBCUTANEOUS
  Filled 2019-09-07 (×3): qty 1

## 2019-09-07 MED ORDER — AZITHROMYCIN 250 MG PO TABS
500.0000 mg | ORAL_TABLET | Freq: Every day | ORAL | Status: AC
  Administered 2019-09-07: 500 mg via ORAL
  Filled 2019-09-07: qty 2

## 2019-09-07 MED ORDER — IPRATROPIUM-ALBUTEROL 20-100 MCG/ACT IN AERS
1.0000 | INHALATION_SPRAY | Freq: Four times a day (QID) | RESPIRATORY_TRACT | Status: DC
  Administered 2019-09-07 – 2019-09-10 (×11): 1 via RESPIRATORY_TRACT
  Filled 2019-09-07: qty 4

## 2019-09-07 MED ORDER — INSULIN ASPART 100 UNIT/ML ~~LOC~~ SOLN
0.0000 [IU] | Freq: Three times a day (TID) | SUBCUTANEOUS | Status: DC
  Administered 2019-09-07: 20 [IU] via SUBCUTANEOUS
  Administered 2019-09-08 (×2): 15 [IU] via SUBCUTANEOUS
  Administered 2019-09-09: 20 [IU] via SUBCUTANEOUS
  Administered 2019-09-09: 3 [IU] via SUBCUTANEOUS
  Administered 2019-09-09: 15 [IU] via SUBCUTANEOUS
  Administered 2019-09-10: 3 [IU] via SUBCUTANEOUS
  Filled 2019-09-07 (×7): qty 1

## 2019-09-07 MED ORDER — AZITHROMYCIN 250 MG PO TABS
250.0000 mg | ORAL_TABLET | Freq: Every day | ORAL | Status: DC
  Administered 2019-09-08 – 2019-09-10 (×3): 250 mg via ORAL
  Filled 2019-09-07 (×3): qty 1

## 2019-09-07 MED ORDER — GADOBUTROL 1 MMOL/ML IV SOLN
7.0000 mL | Freq: Once | INTRAVENOUS | Status: AC | PRN
  Administered 2019-09-07: 7 mL via INTRAVENOUS

## 2019-09-07 MED ORDER — METHYLPREDNISOLONE SODIUM SUCC 40 MG IJ SOLR
40.0000 mg | Freq: Every day | INTRAMUSCULAR | Status: DC
  Administered 2019-09-07 – 2019-09-10 (×4): 40 mg via INTRAVENOUS
  Filled 2019-09-07 (×4): qty 1

## 2019-09-07 NOTE — Progress Notes (Signed)
PIV consult: No clear rationale for second PIV at this time. Recommend stopping incompatible fluids, flush line, run antibiotics then resume fluids. Please re-enter consult if current IV becomes painful or otherwise compromised.

## 2019-09-07 NOTE — Progress Notes (Signed)
    Chaplain called pt. After referral from nursing staff.  Pt. Newly admitted w/prior cancer diagnosis and current COVID+ acc. To nursing staff.  Chaplain attempted phone call and spoke briefly w/pt. Who said it was not a good time to talk.  Chaplain will attempt return call later if schedule permits.   09/07/19 1500  Clinical Encounter Type  Visited With Patient  Visit Type Initial  Referral From Nurse  Recommendations Pt. busy when chaplain called  --> chaplain will attempt return visit if schedule permits

## 2019-09-07 NOTE — ED Notes (Signed)
Patient placed on 2L O2 due to patient's O2 sats consistently dropping to 87-90% while sleeping. O2 sats remained at 88-91% upon waking patient up. No other needs at this time.

## 2019-09-07 NOTE — ED Notes (Signed)
Messaged pharmacy to verify and send patient's night medications

## 2019-09-07 NOTE — ED Notes (Signed)
Checked on patient and administered meds that were just sent up from pharmacy. Patient has no complaints other than being fatigued. Provided patient with warm blanket and some water. Patient has call bell within reach if needed

## 2019-09-07 NOTE — Telephone Encounter (Signed)
Discussed pt's plan of care with Dr.Robinson on 1/7. Reviewed the hospital admission course.   I returned phone call/LVM to call us back.  GB

## 2019-09-07 NOTE — Progress Notes (Signed)
Inpatient Diabetes Program Recommendations  AACE/ADA: New Consensus Statement on Inpatient Glycemic Control (2015)  Target Ranges:  Prepandial:   less than 140 mg/dL      Peak postprandial:   less than 180 mg/dL (1-2 hours)      Critically ill patients:  140 - 180 mg/dL   Lab Results  Component Value Date   GLUCAP 244 (H) 09/07/2019   HGBA1C 8.8 (A) 08/03/2019    Review of Glycemic Control Results for Kimberly [name], Kimberly [name] (MRN [id number]) as of 09/07/2019 14:26  Ref. Range 09/06/2019 15:32 09/06/2019 16:57 09/06/2019 22:15 09/07/2019 07:47 09/07/2019 12:26  Glucose-Capillary Latest Ref Range: 70 - 99 mg/dL 290 (H) 248 (H) 280 (H) 219 (H) 244 (H)   Diabetes history: DM 2 Outpatient Diabetes medications:  Glucotrol XL 10 mg bid, Ozempic 0.25 weekly Current orders for Inpatient glycemic control:  Lantus 10 units q HS, Novolog resistant tid with meals and HS Glipizide 10 mg daily Solumedrol 40 mg daily  Inpatient Diabetes Program Recommendations:    Please consider holding Glipzide while in the hospital.    -Consider increasing Lantus to 18 units daily.  -Also consider adding Tradjenta 5 mg daily while in the hospital due to COVID diagnosis.    Thanks  Adah Perl, RN, BC-ADM Inpatient Diabetes Coordinator Pager [phone] (8a-5p)

## 2019-09-07 NOTE — Evaluation (Signed)
Physical Therapy Evaluation Patient Details Name: Kimberly [name] MRN: [id number] DOB: 23-Jun-1956 Today's Date: 09/07/2019   History of Present Illness  Lashanta Gagne is a 58yoF PMH: COVID-19 infection diagnosed on August 21, 2019, metastatic breast cancer, history of right upper extremity DVT on Eliquis, CKD stage III, hypertension, hyperlipidemia, type 2 diabetes mellitus, hypercholesterolemia, vertigo.  She presented to the hospital today because of nausea, vomiting, intermittent diarrhea, dry cough, shortness of breath. The patient afebrile and had normal oxygen saturation but she was tachycardic and this was worse with standing.  Heart rate was up to 127.  Clinical Impression  Pt admitted with above diagnosis. Pt currently with functional limitations due to the deficits listed below (see "PT Problem List"). Upon entry, pt in bed, awake and agreeable to participate. The pt is alert and oriented x4, pleasant, conversational, and generally a good historian. Pt needs additional time to perform all mobility, but appears safe and confident. SpO2 remains 90-91% on room air the entire session whether resting or AMB in room. Similarly, the patient's HR remains in upper 110s to lower 130s. Functional mobility assessment demonstrates increased effort/time requirements, poor tolerance, and need for physical assistance, whereas the patient performed these at a higher level of independence PTA. Pt unsure if she will need PT at home at DC but open to it at this time. Pt will benefit from skilled PT intervention to increase independence and safety with basic mobility in preparation for discharge to the venue listed below.       Follow Up Recommendations Home health PT    Equipment Recommendations  None recommended by PT    Recommendations for Other Services       Precautions / Restrictions Precautions Precautions: Fall Restrictions Weight Bearing Restrictions: No      Mobility  Bed  Mobility Overal bed mobility: Modified Independent                Transfers Overall transfer level: Modified independent                  Ambulation/Gait Ambulation/Gait assistance: Modified independent (Device/Increase time) Gait Distance (Feet): 100 Feet Assistive device: None Gait Pattern/deviations: WFL(Within Functional Limits) Gait velocity: <0.76m/s   General Gait Details: slow and effortful, without UE support or device, author manages IV pole; SpO2 remains 90-91%; HR 117-131bpm.  Stairs            Wheelchair Mobility    Modified Rankin (Stroke Patients Only)       Balance Overall balance assessment: Modified Independent                                           Pertinent Vitals/Pain Pain Assessment: No/denies pain    Home Living Family/patient expects to be discharged to:: Private residence Living Arrangements: Spouse/significant other Available Help at Discharge: Family Type of Home: House Home Access: Stairs to enter Entrance Stairs-Rails: None Entrance Stairs-Number of Steps: 3 Home Layout: Able to live on main level with bedroom/bathroom;Two level Home Equipment: None      Prior Function Level of Independence: Independent         Comments: independent c ADL/IADL;     Hand Dominance        Extremity/Trunk Assessment   Upper Extremity Assessment Upper Extremity Assessment: Generalized weakness;Overall [facility] for tasks assessed    Lower Extremity Assessment Lower Extremity Assessment: Generalized weakness;Overall [facility]  for tasks assessed    Cervical / Trunk Assessment Cervical / Trunk Assessment: Normal  Communication      Cognition Arousal/Alertness: Awake/alert Behavior During Therapy: WFL for tasks assessed/performed Overall Cognitive Status: Within Functional Limits for tasks assessed                                        General Comments      Exercises      Assessment/Plan    PT Assessment Patient needs continued PT services  PT Problem List Decreased strength;Decreased activity tolerance;Decreased mobility       PT Treatment Interventions Gait training;Functional mobility training;Therapeutic activities;Stair training;Therapeutic exercise;Patient/family education    PT Goals (Current goals can be found in the Care Plan section)  Acute Rehab PT Goals Patient Stated Goal: improve strength and stop with the emesis PT Goal Formulation: With patient Time For Goal Achievement: 09/21/19 Potential to Achieve Goals: Good    Frequency Min 2X/week   Barriers to discharge        Co-evaluation               AM-PAC PT "6 Clicks" Mobility  Outcome Measure Help needed turning from your back to your side while in a flat bed without using bedrails?: None Help needed moving from lying on your back to sitting on the side of a flat bed without using bedrails?: None Help needed moving to and from a bed to a chair (including a wheelchair)?: None Help needed standing up from a chair using your arms (e.g., wheelchair or bedside chair)?: None Help needed to walk in hospital room?: None Help needed climbing 3-5 steps with a railing? : A Little 6 Click Score: 23    End of Session   Activity Tolerance: Patient tolerated treatment well;No increased pain Patient left: in bed;with nursing/sitter in room;with call bell/phone within reach Nurse Communication: Mobility status PT Visit Diagnosis: Muscle weakness (generalized) (M62.81);Difficulty in walking, not elsewhere classified (R26.2)    Time: [id number] PT Time Calculation (min) (ACUTE ONLY): 13 min   Charges:   PT Evaluation $PT Eval Low Complexity: 1 Low          3:11 PM, 09/07/19 Etta Grandchild, PT, DPT Physical Therapist - [facility]  [phone] ([place])    [place] C 09/07/2019, 3:09 PM

## 2019-09-07 NOTE — ED Notes (Signed)
Report to annie 2a rn

## 2019-09-07 NOTE — ED Notes (Signed)
Patient up to restroom without difficulty.   

## 2019-09-07 NOTE — Progress Notes (Signed)
Patient ID: Kimberly [name], female   DOB: 01-16-1956, 64 y.o.   MRN: [id number] [facility] PROGRESS NOTE  Kimberly [name] [id number] DOB: 22-Jul-1956 DOA: 09/06/2019 PCP: System, Pcp Not In  HPI/Subjective: Patient not feeling well.  She is not eating very well.  Sometimes has difficulty getting her words out.  She knows what she wants to say but it takes her some time to say it.  She feels weak.  She has been having nausea and vomiting.  She just not gotten over this Covid infection.  Objective: Vitals:   09/07/19 1030 09/07/19 1122  BP: (!) 148/77 138/87  Pulse: 89 100  Resp:  20  Temp:  98.5 F (36.9 C)  SpO2: 98% 93%    Intake/Output Summary (Last 24 hours) at 09/07/2019 1309 Last data filed at 09/06/2019 1611 Gross per 24 hour  Intake 200 ml  Output --  Net 200 ml   Filed Weights   09/06/19 0856 09/07/19 1122  Weight: 72.6 kg 75.7 kg    ROS: Review of Systems  Constitutional: Negative for chills and fever.  Eyes: Negative for blurred vision.  Respiratory: Positive for cough and shortness of breath.   Cardiovascular: Negative for chest pain.  Gastrointestinal: Positive for nausea and vomiting. Negative for abdominal pain, constipation and diarrhea.  Genitourinary: Negative for dysuria.  Musculoskeletal: Negative for joint pain.  Neurological: Negative for dizziness and headaches.   Exam: Physical Exam  Constitutional: She is oriented to person, place, and time.  HENT:  Nose: No mucosal edema.  Mouth/Throat: No oropharyngeal exudate or posterior oropharyngeal edema.  Eyes: Pupils are equal, round, and reactive to light. Conjunctivae, EOM and lids are normal.  Neck: No JVD present. Carotid bruit is not present. No thyroid mass and no thyromegaly present.  Cardiovascular: S1 normal and S2 normal. Exam reveals no gallop.  No murmur heard. Respiratory: No respiratory distress. She has decreased breath sounds in the right lower field and the left lower  field. She has no wheezes. She has rhonchi in the right lower field and the left lower field. She has no rales.  GI: Soft. Bowel sounds are normal. There is no abdominal tenderness.  Musculoskeletal:     Cervical back: No edema.     Right ankle: No swelling.     Left ankle: No swelling.  Lymphadenopathy:    She has no cervical adenopathy.  Neurological: She is alert and oriented to person, place, and time. No cranial nerve deficit.  Skin: Skin is warm. No rash noted. Nails show no clubbing.  Psychiatric: She has a normal mood and affect.      Data Reviewed: Basic Metabolic Panel: Recent Labs  Lab 09/06/19 0903 09/07/19 0449  NA 134* 135  K 3.2* 3.4*  CL 93* 102  CO2 23 24  GLUCOSE 386* 251*  BUN 9 8  CREATININE 0.92 0.69  CALCIUM 9.2 7.8*  MG 1.5*  --   PHOS 3.6  --    CBC: Recent Labs  Lab 09/06/19 0903 09/07/19 0449  WBC 5.5 4.2  NEUTROABS 3.9  --   HGB 11.7* 9.4*  HCT 34.0* 27.9*  MCV 91.6 93.3  PLT 491* 356    CBG: Recent Labs  Lab 09/06/19 1532 09/06/19 1657 09/06/19 2215 09/07/19 0747 09/07/19 1226  GLUCAP 290* 248* 280* 219* 244*     Studies: DG Chest 2 View  Result Date: 09/06/2019 CLINICAL DATA:  Shortness of breath.  Recent COVID-19 positive EXAM: CHEST - 2 VIEW COMPARISON:  August 21, 2019 FINDINGS: There is patchy airspace opacity in the left mid and lower lung zones. Right lung is clear. Heart size and pulmonary vascularity are normal. No adenopathy. Postoperative change noted in each breast region. IMPRESSION: Patchy airspace opacity left mid and lower lung zones, consistent with multifocal pneumonia. No frank consolidation. Suspect atypical organism pneumonia. Cardiac silhouette normal. No adenopathy. Postoperative changes in each breast region. Electronically Signed   By: Lowella Grip III M.D.   On: 09/06/2019 09:55    Scheduled Meds: . apixaban  5 mg Oral BID  . atorvastatin  20 mg Oral Daily  . [START ON 09/08/2019] azithromycin   250 mg Oral Daily  . calcium-vitamin D  1 tablet Oral Daily  . famotidine  20 mg Oral BID  . glipiZIDE  10 mg Oral BID  . insulin aspart  0-20 Units Subcutaneous TID WC  . insulin aspart  0-5 Units Subcutaneous QHS  . insulin glargine  10 Units Subcutaneous QHS  . Ipratropium-Albuterol  1 puff Inhalation Q6H  . levothyroxine  25 mcg Oral Q0600  . methylPREDNISolone (SOLU-MEDROL) injection  40 mg Intravenous Daily   Continuous Infusions: . 0.9 % NaCl with KCl 20 mEq / L 75 mL/hr at 09/07/19 1222  . cefTRIAXone (ROCEPHIN)  IV      Assessment/Plan:  1. Lobar pneumonia.  Recent Covid infection.  Start antibiotics Rocephin Zithromax and Solu-Medrol.  Combivent inhaler. 2. Dysarthria with difficulty getting words out.  MRI of the brain to rule out metastases and/or stroke. 3. Nausea vomiting as needed nausea medications.  Did eat last night. 4. Type 2 diabetes mellitus on glargine insulin and glipizide 5. Hypokalemia replace and IV fluids 6. Recent COVID-19 infection 7. Metastatic breast cancer.  As per Dr. Velora Mediate notes stage IV.  Oncology follow-up as outpatient 8. Weakness.  Physical therapy evaluation 9. Hypothyroidism unspecified on levothyroxine 10. Anemia of chronic disease.  Hemoglobin 9.4 11. Patient on Eliquis for anticoagulation  Code Status:     Code Status Orders  (From admission, onward)         Start     Ordered   09/06/19 1530  Do not attempt resuscitation (DNR)  Continuous    Question Answer Comment  In the event of cardiac or respiratory ARREST Do not call a "code blue"   In the event of cardiac or respiratory ARREST Do not perform Intubation, CPR, defibrillation or ACLS   In the event of cardiac or respiratory ARREST Use medication by any route, position, wound care, and other measures to relive pain and suffering. May use oxygen, suction and manual treatment of airway obstruction as needed for comfort.      09/06/19 1529        Code Status History     Date Active Date Inactive Code Status Order ID Comments User Context   08/21/2019 0321 08/22/2019 1709 Full Code [id number]  Sidney Ace Arvella Merles, MD ED   02/05/2018 0102 02/05/2018 1538 Full Code [id number]  Amelia Jo, MD Inpatient   Advance Care Planning Activity      Disposition Plan: To be determined  Antibiotics:  Rocephin  Zithromax  Time spent: 28 minutes  [place]

## 2019-09-08 LAB — GLUCOSE, CAPILLARY
Glucose-Capillary: 325 mg/dL — ABNORMAL HIGH (ref 70–99)
Glucose-Capillary: 339 mg/dL — ABNORMAL HIGH (ref 70–99)
Glucose-Capillary: 409 mg/dL — ABNORMAL HIGH (ref 70–99)
Glucose-Capillary: 389 mg/dL — ABNORMAL HIGH (ref 70–99)

## 2019-09-08 LAB — HIV ANTIBODY (ROUTINE TESTING W REFLEX): HIV Screen 4th Generation wRfx: NONREACTIVE

## 2019-09-08 MED ORDER — INSULIN ASPART 100 UNIT/ML ~~LOC~~ SOLN
25.0000 [IU] | Freq: Once | SUBCUTANEOUS | Status: AC
  Administered 2019-09-08: 25 [IU] via SUBCUTANEOUS
  Filled 2019-09-08: qty 1

## 2019-09-08 MED ORDER — GUAIFENESIN 100 MG/5ML PO SOLN
5.0000 mL | ORAL | Status: DC | PRN
  Administered 2019-09-08: 100 mg via ORAL
  Filled 2019-09-08 (×2): qty 5

## 2019-09-08 MED ORDER — INSULIN GLARGINE 100 UNIT/ML ~~LOC~~ SOLN
20.0000 [IU] | Freq: Every day | SUBCUTANEOUS | Status: DC
  Administered 2019-09-08: 20 [IU] via SUBCUTANEOUS
  Filled 2019-09-08 (×2): qty 0.2

## 2019-09-08 NOTE — Progress Notes (Signed)
Patient ID: Jerelyn Charles, female   DOB: 10-15-1955, 64 y.o.   MRN: [id number] [facility] PROGRESS NOTE  Frann Slutz Saari [id number] DOB: [birth year] DOA: 09/06/2019 PCP: System, Pcp Not In  HPI/Subjective: Patient feeling a little bit better today.  Having a lot more cough.  Nervous about going home too soon since this is her third time back to the hospital.  Objective: Vitals:   09/08/19 0506 09/08/19 0805  BP: (!) 90/57 (!) 144/86  Pulse: 87 86  Resp:  19  Temp: 97.8 F (36.6 C) 97.9 F (36.6 C)  SpO2: 95% 95%    Intake/Output Summary (Last 24 hours) at 09/08/2019 1436 Last data filed at 09/08/2019 0900 Gross per 24 hour  Intake 2135.6 ml  Output 1400 ml  Net 735.6 ml   Filed Weights   09/06/19 0856 09/07/19 1122  Weight: 72.6 kg 75.7 kg    ROS: Review of Systems  Constitutional: Negative for chills and fever.  Eyes: Negative for blurred vision.  Respiratory: Positive for cough and shortness of breath.   Cardiovascular: Negative for chest pain.  Gastrointestinal: Negative for abdominal pain, constipation, diarrhea, nausea and vomiting.  Genitourinary: Negative for dysuria.  Musculoskeletal: Negative for joint pain.  Neurological: Negative for dizziness and headaches.   Exam: Physical Exam  Constitutional: She is oriented to person, place, and time.  HENT:  Nose: No mucosal edema.  Mouth/Throat: No oropharyngeal exudate or posterior oropharyngeal edema.  Eyes: Pupils are equal, round, and reactive to light. Conjunctivae, EOM and lids are normal.  Neck: No JVD present. Carotid bruit is not present. No thyroid mass and no thyromegaly present.  Cardiovascular: S1 normal and S2 normal. Exam reveals no gallop.  No murmur heard. Respiratory: No respiratory distress. She has decreased breath sounds in the right lower field and the left lower field. She has wheezes in the right lower field and the left lower field. She has no rhonchi. She has no rales.  GI:  Soft. Bowel sounds are normal. There is no abdominal tenderness.  Musculoskeletal:     Cervical back: No edema.     Right ankle: No swelling.     Left ankle: No swelling.  Lymphadenopathy:    She has no cervical adenopathy.  Neurological: She is alert and oriented to person, place, and time. No cranial nerve deficit.  Skin: Skin is warm. No rash noted. Nails show no clubbing.  Psychiatric: She has a normal mood and affect.      Data Reviewed: Basic Metabolic Panel: Recent Labs  Lab 09/06/19 0903 09/07/19 0449  NA 134* 135  K 3.2* 3.4*  CL 93* 102  CO2 23 24  GLUCOSE 386* 251*  BUN 9 8  CREATININE 0.92 0.69  CALCIUM 9.2 7.8*  MG 1.5*  --   PHOS 3.6  --    CBC: Recent Labs  Lab 09/06/19 0903 09/07/19 0449  WBC 5.5 4.2  NEUTROABS 3.9  --   HGB 11.7* 9.4*  HCT 34.0* 27.9*  MCV 91.6 93.3  PLT 491* 356    CBG: Recent Labs  Lab 09/07/19 1226 09/07/19 1614 09/07/19 2044 09/08/19 0803 09/08/19 1154  GLUCAP 244* 378* 397* 325* 339*     Studies: MR BRAIN W WO CONTRAST  Result Date: 09/07/2019 CLINICAL DATA:  Dysarthria, metastatic breast cancer, recent COVID infection EXAM: MRI HEAD WITHOUT AND WITH CONTRAST TECHNIQUE: Multiplanar, multiecho pulse sequences of the brain and surrounding structures were obtained without and with intravenous contrast. CONTRAST:  66mL GADAVIST GADOBUTROL  1 MMOL/ML IV SOLN COMPARISON:  2018 FINDINGS: Brain: There is no acute infarction or intracranial hemorrhage. There is no intracranial mass, mass effect, or edema. There is no hydrocephalus or extra-axial fluid collection. Minimal patchy T2 hyperintensity in the supratentorial white matter is nonspecific but may reflect minor chronic microvascular ischemic changes. Ventricles and sulci are normal in size and configuration. No abnormal enhancement. Vascular: Major vessel flow voids at the skull base are preserved. Skull and upper cervical spine: Normal marrow signal is preserved.  Sinuses/Orbits: Diffuse paranasal sinus mucosal thickening. Orbits are unremarkable. Other: Sella is unremarkable.  Mastoid air cells are clear. IMPRESSION: No evidence of recent infarction, hemorrhage, or metastatic disease. Electronically Signed   By: Macy Mis M.D.   On: 09/07/2019 19:54    Scheduled Meds: . apixaban  5 mg Oral BID  . atorvastatin  20 mg Oral Daily  . azithromycin  250 mg Oral Daily  . calcium-vitamin D  1 tablet Oral Daily  . famotidine  20 mg Oral BID  . glipiZIDE  10 mg Oral BID  . insulin aspart  0-20 Units Subcutaneous TID WC  . insulin aspart  0-5 Units Subcutaneous QHS  . insulin glargine  20 Units Subcutaneous QHS  . Ipratropium-Albuterol  1 puff Inhalation Q6H  . levothyroxine  25 mcg Oral Q0600  . methylPREDNISolone (SOLU-MEDROL) injection  40 mg Intravenous Daily   Continuous Infusions: . cefTRIAXone (ROCEPHIN)  IV 2 g (09/08/19 1214)    Assessment/Plan:  1. Lobar pneumonia.  Recent Covid infection.  Continue Rocephin and Zithromax.  Continue Solu-Medrol.  Combivent inhaler.  Incentive spirometer and flutter valve. 2. Dysarthria with difficulty getting words out.  MRI of the brain with and without contrast negative for metastases and also negative for stroke.  Could be secondary to Covid infection.   3. Nausea vomiting has resolved 4. Type 2 diabetes mellitus on glargine insulin and glipizide.  Sugars will likely be high on steroids.  Increase glargine insulin at night. 5. Hypokalemia.  Replaced and IV. 6. Recent COVID-19 infection 7. Metastatic breast cancer stage IV as per Dr. Rogue Bussing.  Oncology follow-up as outpatient 8. Weakness.  Appreciate physical therapy evaluation 9. Hypothyroidism unspecified on levothyroxine 10. Anemia of chronic disease.  Hemoglobin 9.4 11. Patient on Eliquis for anticoagulation  Code Status:     Code Status Orders  (From admission, onward)         Start     Ordered   09/06/19 1530  Do not attempt  resuscitation (DNR)  Continuous    Question Answer Comment  In the event of cardiac or respiratory ARREST Do not call a "code blue"   In the event of cardiac or respiratory ARREST Do not perform Intubation, CPR, defibrillation or ACLS   In the event of cardiac or respiratory ARREST Use medication by any route, position, wound care, and other measures to relive pain and suffering. May use oxygen, suction and manual treatment of airway obstruction as needed for comfort.      09/06/19 1529        Code Status History    Date Active Date Inactive Code Status Order ID Comments User Context   08/21/2019 0321 08/22/2019 1709 Full Code [id number]  Sidney Ace Arvella Merles, MD ED   02/05/2018 0102 02/05/2018 1538 Full Code [id number]  Amelia Jo, MD Inpatient   Advance Care Planning Activity      Disposition Plan: Evaluate daily at this point on when to go home  Antibiotics:  Rocephin  Zithromax  Time spent: 27 minutes.  Spoke with husband on the phone and gave update.  [place]  [facility]

## 2019-09-09 LAB — BASIC METABOLIC PANEL
Anion gap: 14 (ref 5–15)
BUN: 11 mg/dL (ref 8–23)
CO2: 23 mmol/L (ref 22–32)
Calcium: 9 mg/dL (ref 8.9–10.3)
Chloride: 99 mmol/L (ref 98–111)
Creatinine, Ser: 0.92 mg/dL (ref 0.44–1.00)
GFR calc Af Amer: >60 mL/min (ref >60)
GFR calc non Af Amer: >60 mL/min (ref >60)
Glucose, Bld: 161 mg/dL — ABNORMAL HIGH (ref 70–99)
Potassium: 3.9 mmol/L (ref 3.5–5.1)
Sodium: 136 mmol/L (ref 135–145)

## 2019-09-09 LAB — GLUCOSE, CAPILLARY
Glucose-Capillary: 123 mg/dL — ABNORMAL HIGH (ref 70–99)
Glucose-Capillary: 318 mg/dL — ABNORMAL HIGH (ref 70–99)
Glucose-Capillary: 399 mg/dL — ABNORMAL HIGH (ref 70–99)
Glucose-Capillary: 357 mg/dL — ABNORMAL HIGH (ref 70–99)

## 2019-09-09 LAB — CBC
HCT: 29.8 % — ABNORMAL LOW (ref 36.0–46.0)
Hemoglobin: 9.9 g/dL — ABNORMAL LOW (ref 12.0–15.0)
MCH: 31.2 pg (ref 26.0–34.0)
MCHC: 33.2 g/dL (ref 30.0–36.0)
MCV: 94 fL (ref 80.0–100.0)
Platelets: 385 10*3/uL (ref 150–400)
RBC: 3.17 MIL/uL — ABNORMAL LOW (ref 3.87–5.11)
RDW: 13.1 % (ref 11.5–15.5)
WBC: 5.5 10*3/uL (ref 4.0–10.5)
nRBC: 0.9 % — ABNORMAL HIGH (ref 0.0–0.2)

## 2019-09-09 MED ORDER — INSULIN GLARGINE 100 UNIT/ML ~~LOC~~ SOLN
24.0000 [IU] | Freq: Every day | SUBCUTANEOUS | Status: DC
  Administered 2019-09-09: 24 [IU] via SUBCUTANEOUS
  Filled 2019-09-09 (×2): qty 0.24

## 2019-09-09 MED ORDER — TRAZODONE HCL 50 MG PO TABS
50.0000 mg | ORAL_TABLET | Freq: Once | ORAL | Status: AC
  Administered 2019-09-09: 50 mg via ORAL
  Filled 2019-09-09: qty 1

## 2019-09-09 NOTE — Progress Notes (Signed)
[place] visited w/pt. Via phonecall per RN suggestion, and as follow-up from initial pt. Encounter on Friday (1/8).  Pt. Shared that she has had cancer and was hospitalized three times in the last month after contracting COVID.  Pt. Expressed particular gratitude for the care she has received from RN staff and physicians in this most recent hospitalization, as the prescribed treatments appear to her to have been more effective than previous treatments.  Pt. Is eager to return home to be with her husband, who, she shared, is recovering from [place] symptoms but is almost well enough to return to work and to caring for his elderly mother (pt.'s Mo-in law). Pt. Also has 64yo son. Pt.'s demeanor seemed positive; mentioned doing exercises in her room to strengthen her lungs.  Pt. Requested prayer at conclusion of [place] visit.  No further needs expressed at this time.      09/09/19 1745  Clinical Encounter Type  Visited With Patient  Visit Type Follow-up;Spiritual support;Psychological support  Referral From Nurse;Chaplain;Patient  Spiritual Encounters  Spiritual Needs Prayer;Emotional  Stress Factors  Patient Stress Factors Other (Comment) (desire to be home w/family)

## 2019-09-09 NOTE — Progress Notes (Signed)
Patient ID: Kimberly [name], female   DOB: 09/21/1955, 64 y.o.   MRN: [id number] [facility] PROGRESS NOTE  Rini Ghio [place] [id number] DOB: 1955-10-22 DOA: 09/06/2019 PCP: System, Pcp Not In  HPI/Subjective: Patient feeling a little bit better than yesterday.  Nervous about going home too soon.  Still with some shortness of breath and cough.  Appetite better.  Feels okay walking around.  Objective: Vitals:   09/09/19 0738 09/09/19 1531  BP: (!) 142/97 (!) 143/89  Pulse: 93 97  Resp: 19 20  Temp: 98.2 F (36.8 C) 98.3 F (36.8 C)  SpO2: 95% 95%    Intake/Output Summary (Last 24 hours) at 09/09/2019 1613 Last data filed at 09/09/2019 0814 Gross per 24 hour  Intake --  Output 1250 ml  Net -1250 ml   Filed Weights   09/06/19 0856 09/07/19 1122  Weight: 72.6 kg 75.7 kg    ROS: Review of Systems  Constitutional: Negative for chills and fever.  Eyes: Negative for blurred vision.  Respiratory: Positive for cough and shortness of breath.   Cardiovascular: Negative for chest pain.  Gastrointestinal: Negative for abdominal pain, constipation, diarrhea, nausea and vomiting.  Genitourinary: Negative for dysuria.  Musculoskeletal: Negative for joint pain.  Neurological: Negative for dizziness and headaches.   Exam: Physical Exam  Constitutional: She is oriented to person, place, and time.  HENT:  Nose: No mucosal edema.  Mouth/Throat: No oropharyngeal exudate or posterior oropharyngeal edema.  Eyes: Pupils are equal, round, and reactive to light. Conjunctivae, EOM and lids are normal.  Neck: Carotid bruit is not present.  Cardiovascular: S1 normal and S2 normal. Exam reveals no gallop.  No murmur heard. Respiratory: No respiratory distress. She has decreased breath sounds in the right lower field and the left lower field. She has no wheezes. She has no rhonchi. She has no rales.  Coarse breath sounds at the bases.  GI: Soft. Bowel sounds are normal. There is no  abdominal tenderness.  Musculoskeletal:     Right ankle: No swelling.     Left ankle: No swelling.  Lymphadenopathy:    She has no cervical adenopathy.  Neurological: She is alert and oriented to person, place, and time. No cranial nerve deficit.  Skin: Skin is warm. No rash noted. Nails show no clubbing.  Psychiatric: She has a normal mood and affect.      Data Reviewed: Basic Metabolic Panel: Recent Labs  Lab 09/06/19 0903 09/07/19 0449 09/09/19 0455  NA 134* 135 136  K 3.2* 3.4* 3.9  CL 93* 102 99  CO2 23 24 23   GLUCOSE 386* 251* 161*  BUN 9 8 11   CREATININE 0.92 0.69 0.92  CALCIUM 9.2 7.8* 9.0  MG 1.5*  --   --   PHOS 3.6  --   --    CBC: Recent Labs  Lab 09/06/19 0903 09/07/19 0449 09/09/19 0455  WBC 5.5 4.2 5.5  NEUTROABS 3.9  --   --   HGB 11.7* 9.4* 9.9*  HCT 34.0* 27.9* 29.8*  MCV 91.6 93.3 94.0  PLT 491* 356 385    CBG: Recent Labs  Lab 09/08/19 1154 09/08/19 1711 09/08/19 2101 09/09/19 0739 09/09/19 1118  GLUCAP 339* 409* 389* 123* 318*     Studies: MR BRAIN W WO CONTRAST  Result Date: 09/07/2019 CLINICAL DATA:  Dysarthria, metastatic breast cancer, recent COVID infection EXAM: MRI HEAD WITHOUT AND WITH CONTRAST TECHNIQUE: Multiplanar, multiecho pulse sequences of the brain and surrounding structures were obtained without and with  intravenous contrast. CONTRAST:  98mL GADAVIST GADOBUTROL 1 MMOL/ML IV SOLN COMPARISON:  2018 FINDINGS: Brain: There is no acute infarction or intracranial hemorrhage. There is no intracranial mass, mass effect, or edema. There is no hydrocephalus or extra-axial fluid collection. Minimal patchy T2 hyperintensity in the supratentorial white matter is nonspecific but may reflect minor chronic microvascular ischemic changes. Ventricles and sulci are normal in size and configuration. No abnormal enhancement. Vascular: Major vessel flow voids at the skull base are preserved. Skull and upper cervical spine: Normal marrow signal  is preserved. Sinuses/Orbits: Diffuse paranasal sinus mucosal thickening. Orbits are unremarkable. Other: Sella is unremarkable.  Mastoid air cells are clear. IMPRESSION: No evidence of recent infarction, hemorrhage, or metastatic disease. Electronically Signed   By: Macy Mis M.D.   On: 09/07/2019 19:54    Scheduled Meds: . apixaban  5 mg Oral BID  . atorvastatin  20 mg Oral Daily  . azithromycin  250 mg Oral Daily  . calcium-vitamin D  1 tablet Oral Daily  . famotidine  20 mg Oral BID  . glipiZIDE  10 mg Oral BID  . insulin aspart  0-20 Units Subcutaneous TID WC  . insulin aspart  0-5 Units Subcutaneous QHS  . insulin glargine  20 Units Subcutaneous QHS  . Ipratropium-Albuterol  1 puff Inhalation Q6H  . levothyroxine  25 mcg Oral Q0600  . methylPREDNISolone (SOLU-MEDROL) injection  40 mg Intravenous Daily   Continuous Infusions: . cefTRIAXone (ROCEPHIN)  IV 2 g (09/09/19 1159)    Assessment/Plan:  1. Lobar pneumonia.  Recent Covid infection.  Continue Rocephin and Zithromax.  Continue Solu-Medrol.  Combivent inhaler.  Incentive spirometer and flutter valve. 2. Dysarthria with difficulty getting words out.  MRI of the brain with and without contrast negative for metastases and also negative for stroke.  Could be secondary to Covid infection.  This seems better today. 3. Type 2 diabetes mellitus on glargine insulin and glipizide.  Sugars will likely be high on steroids.  Increased glargine insulin to 24 units at night. 4. Hypokalemia.  Replaced. 5. Recent COVID-19 infection 6. Metastatic breast cancer stage IV as per Dr. Rogue Bussing.  Oncology follow-up as outpatient 7. Weakness.  Appreciate physical therapy evaluation 8. Hypothyroidism unspecified on levothyroxine 9. Anemia of chronic disease.  Hemoglobin 9.9 10. Patient on Eliquis for anticoagulation  Code Status:     Code Status Orders  (From admission, onward)         Start     Ordered   09/06/19 1530  Do not  attempt resuscitation (DNR)  Continuous    Question Answer Comment  In the event of cardiac or respiratory ARREST Do not call a "code blue"   In the event of cardiac or respiratory ARREST Do not perform Intubation, CPR, defibrillation or ACLS   In the event of cardiac or respiratory ARREST Use medication by any route, position, wound care, and other measures to relive pain and suffering. May use oxygen, suction and manual treatment of airway obstruction as needed for comfort.      09/06/19 1529        Code Status History    Date Active Date Inactive Code Status Order ID Comments User Context   08/21/2019 0321 08/22/2019 1709 Full Code [id number]  Sidney Ace Arvella Merles, MD ED   02/05/2018 0102 02/05/2018 1538 Full Code [id number]  Amelia Jo, MD Inpatient   Advance Care Planning Activity      Disposition Plan: Re-evalute tomorrow  Antibiotics:  Rocephin  Zithromax  Time spent: 27 minutes.    [place]  [facility] [employer]

## 2019-09-10 ENCOUNTER — Telehealth: Payer: Self-pay | Admitting: Internal Medicine

## 2019-09-10 DIAGNOSIS — E1165 Type 2 diabetes mellitus with hyperglycemia: Secondary | ICD-10-CM

## 2019-09-10 LAB — GLUCOSE, CAPILLARY: Glucose-Capillary: 138 mg/dL — ABNORMAL HIGH (ref 70–99)

## 2019-09-10 MED ORDER — AZITHROMYCIN 250 MG PO TABS: ORAL_TABLET | ORAL | 0 refills | Status: DC

## 2019-09-10 MED ORDER — IPRATROPIUM-ALBUTEROL 20-100 MCG/ACT IN AERS: 1.0000 | INHALATION_SPRAY | Freq: Four times a day (QID) | RESPIRATORY_TRACT | 0 refills | Status: DC

## 2019-09-10 MED ORDER — METHYLPREDNISOLONE SODIUM SUCC 40 MG IJ SOLR
40.0000 mg | Freq: Every day | INTRAMUSCULAR | Status: DC
  Administered 2019-09-10: 40 mg via INTRAVENOUS

## 2019-09-10 MED ORDER — CEFDINIR 300 MG PO CAPS: 300.0000 mg | ORAL_CAPSULE | Freq: Two times a day (BID) | ORAL | 0 refills | Status: DC

## 2019-09-10 MED ORDER — NOVOLOG FLEXPEN 100 UNIT/ML ~~LOC~~ SOPN: 4.0000 [IU] | PEN_INJECTOR | Freq: Three times a day (TID) | SUBCUTANEOUS | 0 refills | Status: DC

## 2019-09-10 MED ORDER — PREDNISONE 10 MG PO TABS: ORAL_TABLET | ORAL | 0 refills | Status: DC

## 2019-09-10 MED ORDER — SODIUM CHLORIDE 0.9 % IV SOLN
2.0000 g | Freq: Once | INTRAVENOUS | Status: AC
  Administered 2019-09-10: 2 g via INTRAVENOUS
  Filled 2019-09-10: qty 20
  Filled 2019-09-10: qty 2

## 2019-09-10 MED ORDER — INSULIN ASPART 100 UNIT/ML CARTRIDGE (PENFILL): 4.0000 [IU] | Freq: Three times a day (TID) | SUBCUTANEOUS | 0 refills | Status: DC

## 2019-09-10 MED ORDER — BLOOD GLUCOSE MONITOR KIT: PACK | 0 refills | Status: DC

## 2019-09-10 MED ORDER — INSULIN DETEMIR 100 UNIT/ML FLEXPEN: 20.0000 [IU] | PEN_INJECTOR | Freq: Every day | SUBCUTANEOUS | 0 refills | Status: DC

## 2019-09-10 MED ORDER — INSULIN STARTER KIT- PEN NEEDLES (ENGLISH): 1.0000 | Freq: Once | 0 refills | Status: AC

## 2019-09-10 NOTE — Discharge Instructions (Signed)
Levemir insulin can be titrated down as your prednisone gets tapered down.  Check sugars four times a day and anytime you feel funny.  Once all sugars are below 300, can cut back to 15 units levemir insulin.  Once all sugars less than 250, can cut back to levemir 10 units.  Can stop levemir and short acting insulin and go back on your weekly insulin once off steroids.

## 2019-09-10 NOTE — Telephone Encounter (Signed)
On 1/08-spoke to patient regarding continue holding of her abema which is currently being admitted to hospital for Covid pneumonia.  Patient currently on steroids/antibiotics-improved.  Appreciate/continue care as per the hospitalist service.

## 2019-09-10 NOTE — TOC Initial Note (Signed)
Transition of Care [facility]) - Initial/Assessment Note    Patient Details  Name: Kimberly [name] MRN: [id number] Date of Birth: 1955/12/23  Transition of Care [facility] CM/SW Contact:    Eileen Stanford, LCSW Phone Number: 09/10/2019, 9:33 AM  Clinical Narrative:   Pt is COVID+. CSW spoke with pt via telephone. Pt's is agreeable to [facility]. Pt was asking if insurance would cover. CSW explained that the amount of times PT came in to the home would be dependent on insurance coverage. Pt is agreeable to [facility]. Referral was provided to [facility]. Pt states she goes to the [facility]. Pt states she does not have one dr, but instead the dr's rotate.              Expected Discharge Plan: [place] Barriers to Discharge: No Barriers Identified   Patient Goals and CMS Choice Patient states their goals for this hospitalization and ongoing recovery are:: to go home   Choice offered to / list presented to : Patient  Expected Discharge Plan and Services Expected Discharge Plan: [place] In-house Referral: NA   Post Acute Care Choice: [place] arrangements for the past 2 months: [place] Expected Discharge Date: 09/10/19                         HH Arranged: PT [place] Agency: [place] ([place]) Date HH Agency Contacted: 09/10/19 Time HH Agency Contacted: [phone] Representative spoke with at [place]: [place] Arrangements/Services Living arrangements for the past 2 months: [place] Lives with:: Spouse Patient language and need for interpreter reviewed:: Yes Do you feel safe going back to the place where you live?: Yes      Need for Family Participation in Patient Care: Yes (Comment) Care giver support system in place?: Yes (comment)   Criminal Activity/Legal Involvement Pertinent to Current Situation/Hospitalization: No - Comment as needed  Activities of Daily Living Home Assistive  Devices/Equipment: None ADL Screening (condition at time of admission) Patient's cognitive ability adequate to safely complete daily activities?: Yes Is the patient deaf or have difficulty hearing?: No Does the patient have difficulty seeing, even when wearing glasses/contacts?: No Does the patient have difficulty concentrating, remembering, or making decisions?: No Patient able to express need for assistance with ADLs?: Yes Does the patient have difficulty dressing or bathing?: No Independently performs ADLs?: Yes (appropriate for developmental age) Does the patient have difficulty walking or climbing stairs?: No Weakness of Legs: None Weakness of Arms/Hands: None  Permission Sought/Granted Permission sought to share information with : Family Supports    Share Information with NAME: Nicole Kindred  Permission granted to share info w AGENCY: [place] granted to share info w Relationship: spouse     Emotional Assessment Appearance:: Appears stated age Attitude/Demeanor/Rapport: Engaged Affect (typically observed): Accepting, Appropriate Orientation: : Oriented to Self, Oriented to Place, Oriented to  Time, Oriented to Situation Alcohol / Substance Use: Not Applicable Psych Involvement: No (comment)  Admission diagnosis:  Dehydration [E86.0] SOB (shortness of breath) [R06.02] Generalized weakness [R53.1] Pneumonia [J18.9] Patient Active Problem List   Diagnosis Date Noted  . Lobar pneumonia ([place]) 09/07/2019  . Dysarthria   . Nausea and vomiting   . Hypothyroidism   . Anemia of chronic disease   . Weakness 09/06/2019  . Hypokalemia 09/06/2019  . COVID-19 08/21/2019  . Hypercholesterolemia 10/27/2018  . Thyroid  dysfunction 10/27/2018  . Metastatic breast cancer ([place]) 10/20/2018  . Osteopenia 10/20/2018  . Pain and swelling of right upper extremity 04/11/2018  . Chest pain 02/04/2018  . Vertigo 03/08/2017  . Adenopathy   . Mediastinal adenopathy 01/25/2017   . Diabetes mellitus ([place]) 03/24/2012  . Carcinoma of lower-outer quadrant of right breast in female, estrogen receptor positive ([place]) 08/06/2011   PCP:  System, Pcp Not In Pharmacy:   [id number] [place], [place] [address] Phone: [phone] Fax: [phone]  [facility] [place] [place] [address] [place] [address] Phone: [phone] Fax: [phone]  [place], [place] [address] Phone: [phone] Fax: [phone]     Social Determinants of Health (SDOH) Interventions    Readmission Risk Interventions No flowsheet data found.

## 2019-09-10 NOTE — Discharge Summary (Signed)
[place] [facility] [place] NAME: Kimberly [name]    MR#:  [id number]  DATE OF BIRTH:  [birth year]  DATE OF ADMISSION:  09/06/2019 ADMITTING PHYSICIAN: Loletha Grayer, MD  DATE OF DISCHARGE: 09/10/2019  1:00 PM  PRIMARY CARE PHYSICIAN: Dr Rogue Bussing   ADMISSION DIAGNOSIS:  Dehydration [E86.0] SOB (shortness of breath) [R06.02] Generalized weakness [R53.1] Pneumonia [J18.9]  DISCHARGE DIAGNOSIS:  Active Problems:   Metastatic breast cancer (HCC)   Weakness   Hypokalemia   Lobar pneumonia (HCC)   Dysarthria   Nausea and vomiting   Hypothyroidism   Anemia of chronic disease   SECONDARY DIAGNOSIS:   Past Medical History:  Diagnosis Date  . Arthritis   . Breast cancer ([place])    current in between breast   . Cancer of lower-outer quadrant of female breast ([place]) 08/06/2011   RIGHT   . High cholesterol   . History of kidney stones   . Hx of bilateral breast implants   . Hypertension   . PONV (postoperative nausea and vomiting)   . Type II diabetes mellitus (HCC)    fasting 140-150  . Vertigo    LAST WEEK  . Vertigo 01/2017    HOSPITAL COURSE:   1.  Lobar pneumonia with recent Covid infection.  The patient was given Rocephin and Zithromax during the hospital course.  She will be discharged home on 1 more day of Zithromax orally and 2 days of Omnicef. 2.  Dysarthria with difficulty getting words out.  MRI of the brain with and without contrast negative for brain metastases and stroke.  This could be secondary to Covid infection.  This seems better today. 3.  Type 2 diabetes mellitus.  The patient was on glargine insulin and glipizide while here in the hospital.  She does take Ozempic at home.  This will be held until she is off the steroids.  She was prescribed Levemir upon going home 20 units at night.  She was also given short acting insulin.  I prescribed a glucometer and pen needles.  As she is to start tapering the steroids can  potentially go down to 15 units of Levemir and down to 10 units of Levemir and then hopefully off the Levemir altogether and back on her Ozempic when she is off the steroids. 4.  Hypokalemia this was replaced during the hospital course 5.  Recent COVID-19 infection 6.  Metastatic breast cancer with stage IV as per Dr. Rogue Bussing.  Oncology follow-up as outpatient. 7.  Weakness.  Appreciate physical therapy consultation.  Home health recommended which was set up. 8.  Hypothyroidism unspecified on levothyroxine 9.  Anemia of chronic disease.  Last hemoglobin 9.5 10.  Patient on Eliquis for anticoagulation  DISCHARGE CONDITIONS:   Satisfactory  CONSULTS OBTAINED:  None  DRUG ALLERGIES:   Allergies  Allergen Reactions  . Sulfa Antibiotics Rash    DISCHARGE MEDICATIONS:   Allergies as of 09/10/2019      Reactions   Sulfa Antibiotics Rash      Medication List    STOP taking these medications   lisinopril 10 MG tablet Commonly known as: ZESTRIL   mupirocin ointment 2 % Commonly known as: Bactroban   Ozempic (0.25 or 0.5 MG/DOSE) 2 MG/1.5ML Sopn Generic drug: Semaglutide(0.25 or 0.5MG/DOS)     TAKE these medications   aluminum-magnesium hydroxide-simethicone 924-268-34 MG/5ML Susp Commonly known as: MAALOX Take 30 mLs by mouth 4 (four) times daily -  before meals and at bedtime.  apixaban 5 MG Tabs tablet Commonly known as: Eliquis Take 1 tablet (5 mg total) by mouth 2 (two) times daily.   atorvastatin 20 MG tablet Commonly known as: LIPITOR Take 1 tablet (20 mg total) by mouth daily.   azithromycin 250 MG tablet Commonly known as: ZITHROMAX One tab po once on 09/11/2019 then stop Start taking on: September 11, 2019   betamethasone valerate 0.1 % cream Commonly known as: VALISONE Apply 0.1 application topically as needed.   blood glucose meter kit and supplies Kit Dispense based on patient and insurance preference. Use up to four times daily as directed. (FOR  ICD-9 250.00, 250.01).   CALCIUM 600 + D PO Take 1 tablet by mouth 2 (two) times daily.   cefdinir 300 MG capsule Commonly known as: OMNICEF Take 1 capsule (300 mg total) by mouth 2 (two) times daily. Start taking on: September 11, 2019   famotidine 20 MG tablet Commonly known as: PEPCID Take 1 tablet (20 mg total) by mouth 2 (two) times daily.   fulvestrant 250 MG/5ML injection Commonly known as: FASLODEX Inject into the muscle every 30 (thirty) days. One injection each buttock over 1-2 minutes. Warm prior to use.   glipiZIDE 10 MG 24 hr tablet Commonly known as: GLUCOTROL XL Take 1 tablet (10 mg total) by mouth 2 (two) times daily.   Insulin Detemir 100 UNIT/ML Pen Commonly known as: LEVEMIR Inject 20 Units into the skin at bedtime.   insulin starter kit- pen needles Misc 1 kit by Other route once for 1 dose.   Ipratropium-Albuterol 20-100 MCG/ACT Aers respimat Commonly known as: COMBIVENT Inhale 1 puff into the lungs every 6 (six) hours.   levothyroxine 25 MCG tablet Commonly known as: SYNTHROID TAKE 1 TABLET (25 MCG TOTAL) BY MOUTH DAILY BEFORE BREAKFAST.   loperamide 2 MG tablet Commonly known as: IMODIUM A-D Take 2 mg by mouth 4 (four) times daily as needed for diarrhea or loose stools.   NovoLOG FlexPen 100 UNIT/ML FlexPen Generic drug: insulin aspart Inject 4 Units into the skin 3 (three) times daily with meals.   ondansetron 8 MG tablet Commonly known as: ZOFRAN TAKE 1 TABLET (8 MG TOTAL) BY MOUTH EVERY 8 (EIGHT) HOURS AS NEEDED FOR NAUSEA OR VOMITING.   predniSONE 10 MG tablet Commonly known as: DELTASONE 3 tabs po day1; 2 tabs po day2,3; 1 tab po day4,5; 1/2 tab po day6,7   promethazine 25 MG tablet Commonly known as: PHENERGAN Take 1 tablet (25 mg total) by mouth every 6 (six) hours as needed for nausea or vomiting.   Verzenio 100 MG tablet Generic drug: abemaciclib TAKE 1 TABLET BY MOUTH TWICE DAILY        DISCHARGE INSTRUCTIONS:    Follow-up Dr. Rogue Bussing 1 week  If you experience worsening of your admission symptoms, develop shortness of breath, life threatening emergency, suicidal or homicidal thoughts you must seek medical attention immediately by calling 911 or calling your MD immediately  if symptoms less severe.  You Must read complete instructions/literature along with all the possible adverse reactions/side effects for all the Medicines you take and that have been prescribed to you. Take any new Medicines after you have completely understood and accept all the possible adverse reactions/side effects.   Please note  You were cared for by a hospitalist during your hospital stay. If you have any questions about your discharge medications or the care you received while you were in the hospital after you are discharged, you can call the  unit and asked to speak with the hospitalist on call if the hospitalist that took care of you is not available. Once you are discharged, your primary care physician will handle any further medical issues. Please note that NO REFILLS for any discharge medications will be authorized once you are discharged, as it is imperative that you return to your primary care physician (or establish a relationship with a primary care physician if you do not have one) for your aftercare needs so that they can reassess your need for medications and monitor your lab values.    Today   CHIEF COMPLAINT:   Chief Complaint  Patient presents with  . Shortness of Breath    HISTORY OF PRESENT ILLNESS:  Kimberly [name]  is a 64 y.o. female came back to the hospital with shortness of breath   VITAL SIGNS:  Blood pressure 127/75, pulse (!) 102, temperature 98.2 F (36.8 C), temperature source Oral, resp. rate 14, height '5\' 2"'$  (1.575 m), weight 75.7 kg, SpO2 91 %.  I/O:    Intake/Output Summary (Last 24 hours) at 09/10/2019 1516 Last data filed at 09/10/2019 0450 Gross per 24 hour  Intake --   Output 1100 ml  Net -1100 ml    PHYSICAL EXAMINATION:  GENERAL:  64 y.o.-year-old patient lying in the bed with no acute distress.  EYES: Pupils equal, round, reactive to light and accommodation. No scleral icterus. Extraocular muscles intact.  HEENT: Head atraumatic, normocephalic. Oropharynx and nasopharynx clear.  NECK:  Supple, no jugular venous distention. No thyroid enlargement, no tenderness.  LUNGS: Decreased breath sounds bilaterally, no wheezing, rales,rhonchi or crepitation. No use of accessory muscles of respiration.  CARDIOVASCULAR: S1, S2 normal. No murmurs, rubs, or gallops.  ABDOMEN: Soft, non-tender, non-distended. Bowel sounds present. No organomegaly or mass.  EXTREMITIES: No pedal edema, cyanosis, or clubbing.  NEUROLOGIC: Cranial nerves II through XII are intact. Muscle strength 5/5 in all extremities. Sensation intact. Gait not checked.  PSYCHIATRIC: The patient is alert and oriented x 3.  SKIN: No obvious rash, lesion, or ulcer.   DATA REVIEW:   CBC Recent Labs  Lab 09/09/19 0455  WBC 5.5  HGB 9.9*  HCT 29.8*  PLT 385    Chemistries  Recent Labs  Lab 09/06/19 0903 09/09/19 0455  NA 134* 136  K 3.2* 3.9  CL 93* 99  CO2 23 23  GLUCOSE 386* 161*  BUN 9 11  CREATININE 0.92 0.92  CALCIUM 9.2 9.0  MG 1.5*  --      Microbiology Results  Results for orders placed or performed during the hospital encounter of 08/20/19  Respiratory Panel by RT PCR (Flu A&B, Covid) - Nasopharyngeal Swab     Status: Abnormal   Collection Time: 08/21/19 12:58 AM   Specimen: Nasopharyngeal Swab  Result Value Ref Range Status   SARS Coronavirus 2 by RT PCR POSITIVE (A) NEGATIVE Final    Comment: RESULT CALLED TO, READ BACK BY AND VERIFIED WITH: Georgann Housekeeper RN [phone] 08/21/2019 HNM (NOTE) SARS-CoV-2 target nucleic acids are DETECTED. SARS-CoV-2 RNA is generally detectable in upper respiratory specimens  during the acute phase of infection. Positive results are  indicative of the presence of the identified virus, but do not rule out bacterial infection or co-infection with other pathogens not detected by the test. Clinical correlation with patient history and other diagnostic information is necessary to determine patient infection status. The expected result is Negative. Fact Sheet for Patients:  PinkCheek.be Fact Sheet for Healthcare Providers: GravelBags.it  This test is not yet approved or cleared by the [place] and  has been authorized for detection and/or diagnosis of SARS-CoV-2 by FDA under an Emergency Use Authorization (EUA).  This EUA will remain in effect (meaning this test can be used) f or the duration of  the COVID-19 declaration under Section 564(b)(1) of the Act, 21 U.S.C. section 360bbb-3(b)(1), unless the authorization is terminated or revoked sooner.    Influenza A by PCR NEGATIVE NEGATIVE Final   Influenza B by PCR NEGATIVE NEGATIVE Final    Comment: (NOTE) The Xpert Xpress SARS-CoV-2/FLU/RSV assay is intended as an aid in  the diagnosis of influenza from Nasopharyngeal swab specimens and  should not be used as a sole basis for treatment. Nasal washings and  aspirates are unacceptable for Xpert Xpress SARS-CoV-2/FLU/RSV  testing. Fact Sheet for Patients: PinkCheek.be Fact Sheet for Healthcare Providers: GravelBags.it This test is not yet approved or cleared by the [place] FDA and  has been authorized for detection and/or diagnosis of SARS-CoV-2 by  FDA under an Emergency Use Authorization (EUA). This EUA will remain  in effect (meaning this test can be used) for the duration of the  Covid-19 declaration under Section 564(b)(1) of the Act, 21  U.S.C. section 360bbb-3(b)(1), unless the authorization is  terminated or revoked. Performed at [facility], [address]  [place], [place] [address]       Management plans discussed with the patient, family and they are in agreement.  CODE STATUS:     Code Status Orders  (From admission, onward)         Start     Ordered   09/06/19 1530  Do not attempt resuscitation (DNR)  Continuous    Question Answer Comment  In the event of cardiac or respiratory ARREST Do not call a "code blue"   In the event of cardiac or respiratory ARREST Do not perform Intubation, CPR, defibrillation or ACLS   In the event of cardiac or respiratory ARREST Use medication by any route, position, wound care, and other measures to relive pain and suffering. May use oxygen, suction and manual treatment of airway obstruction as needed for comfort.      09/06/19 1529        Code Status History    Date Active Date Inactive Code Status Order ID Comments User Context   08/21/2019 0321 08/22/2019 1709 Full Code [id number]  Sidney Ace Arvella Merles, MD ED   02/05/2018 0102 02/05/2018 1538 Full Code [id number]  Amelia Jo, MD Inpatient   Advance Care Planning Activity      TOTAL TIME TAKING CARE OF THIS PATIENT: 35 minutes.    Loletha Grayer M.D on 09/10/2019 at 3:16 PM  Between 7am to 6pm - Pager - [phone]  After 6pm go to [email or web address] - password [email or web address]  [facility]  CC: Primary care physician; Dr Rogue Bussing

## 2019-09-10 NOTE — Plan of Care (Signed)
  Problem: Education: Goal: Knowledge of General Education information will improve Description: Including pain rating scale, medication(s)/side effects and non-pharmacologic comfort measures Outcome: Progressing   Problem: Health Behavior/Discharge Planning: Goal: Ability to manage health-related needs will improve Outcome: Progressing   Problem: Clinical Measurements: Goal: Ability to maintain clinical measurements within normal limits will improve Outcome: Progressing Goal: Will remain free from infection Outcome: Progressing Goal: Diagnostic test results will improve Outcome: Progressing Goal: Respiratory complications will improve Outcome: Progressing Goal: Cardiovascular complication will be avoided Outcome: Progressing   Problem: Coping: Goal: Level of anxiety will decrease Outcome: Progressing   Problem: Elimination: Goal: Will not experience complications related to bowel motility Outcome: Progressing Goal: Will not experience complications related to urinary retention Outcome: Progressing   Problem: Nutrition: Goal: Adequate nutrition will be maintained Outcome: Progressing   Problem: Pain Managment: Goal: General experience of comfort will improve Outcome: Progressing   Problem: Safety: Goal: Ability to remain free from injury will improve Outcome: Progressing

## 2019-09-11 ENCOUNTER — Telehealth: Payer: Self-pay | Admitting: Internal Medicine

## 2019-09-11 NOTE — Telephone Encounter (Signed)
On 1/11-left a message for patient to call us back to discuss follow-up plan.  H/T-please call patient today-discussed the follow-up plan.  I can speak with the patient when the patient calls back.

## 2019-09-12 ENCOUNTER — Telehealth: Payer: Self-pay | Admitting: Internal Medicine

## 2019-09-12 NOTE — Telephone Encounter (Signed)
On 1/12-I spoke to patient regarding follow-up visit/imaging.  Patient unfortunately lost her husband to Covid on 1/12.  Very emotional.   Recommend rescheduling the CT scan; MD visit/labs Faslodex-to next week.  Patient agreement.  C-please reschedule. thx GB

## 2019-09-13 ENCOUNTER — Ambulatory Visit: Payer: Managed Care, Other (non HMO)

## 2019-09-14 ENCOUNTER — Inpatient Hospital Stay: Payer: Managed Care, Other (non HMO) | Admitting: Internal Medicine

## 2019-09-14 ENCOUNTER — Inpatient Hospital Stay: Payer: Managed Care, Other (non HMO)

## 2019-09-17 ENCOUNTER — Other Ambulatory Visit: Payer: Self-pay

## 2019-09-21 ENCOUNTER — Ambulatory Visit: Payer: Managed Care, Other (non HMO)

## 2019-09-26 ENCOUNTER — Inpatient Hospital Stay: Payer: Self-pay

## 2019-09-26 ENCOUNTER — Inpatient Hospital Stay: Payer: Self-pay | Admitting: Internal Medicine

## 2019-10-02 ENCOUNTER — Encounter: Payer: Self-pay | Admitting: Pharmacy Technician

## 2019-10-02 NOTE — Progress Notes (Signed)
Patient has been approved for drug assistance by [employer] for Faslodex. The enrollment period is from 10/02/19-10/01/20 based on self pay. First DOS covered is 10/04/19.

## 2019-10-04 ENCOUNTER — Encounter: Payer: Self-pay | Admitting: Internal Medicine

## 2019-10-04 ENCOUNTER — Inpatient Hospital Stay: Payer: Medicare Other

## 2019-10-04 ENCOUNTER — Inpatient Hospital Stay: Payer: Medicare Other | Attending: Internal Medicine

## 2019-10-04 ENCOUNTER — Other Ambulatory Visit: Payer: Self-pay

## 2019-10-04 ENCOUNTER — Inpatient Hospital Stay (HOSPITAL_BASED_OUTPATIENT_CLINIC_OR_DEPARTMENT_OTHER): Payer: Medicare Other | Admitting: Internal Medicine

## 2019-10-04 VITALS — Wt 176.0 lb

## 2019-10-04 DIAGNOSIS — Z8616 Personal history of COVID-19: Secondary | ICD-10-CM | POA: Insufficient documentation

## 2019-10-04 DIAGNOSIS — Z9882 Breast implant status: Secondary | ICD-10-CM | POA: Insufficient documentation

## 2019-10-04 DIAGNOSIS — R197 Diarrhea, unspecified: Secondary | ICD-10-CM | POA: Diagnosis not present

## 2019-10-04 DIAGNOSIS — Z79899 Other long term (current) drug therapy: Secondary | ICD-10-CM | POA: Diagnosis not present

## 2019-10-04 DIAGNOSIS — C50511 Malignant neoplasm of lower-outer quadrant of right female breast: Secondary | ICD-10-CM | POA: Diagnosis present

## 2019-10-04 DIAGNOSIS — Z7901 Long term (current) use of anticoagulants: Secondary | ICD-10-CM | POA: Insufficient documentation

## 2019-10-04 DIAGNOSIS — Z170 Estrogen receptor positive status [ER+]: Secondary | ICD-10-CM | POA: Diagnosis not present

## 2019-10-04 DIAGNOSIS — Z794 Long term (current) use of insulin: Secondary | ICD-10-CM | POA: Insufficient documentation

## 2019-10-04 DIAGNOSIS — I10 Essential (primary) hypertension: Secondary | ICD-10-CM | POA: Diagnosis not present

## 2019-10-04 DIAGNOSIS — E1122 Type 2 diabetes mellitus with diabetic chronic kidney disease: Secondary | ICD-10-CM | POA: Insufficient documentation

## 2019-10-04 DIAGNOSIS — Z9013 Acquired absence of bilateral breasts and nipples: Secondary | ICD-10-CM | POA: Insufficient documentation

## 2019-10-04 DIAGNOSIS — Z833 Family history of diabetes mellitus: Secondary | ICD-10-CM | POA: Diagnosis not present

## 2019-10-04 DIAGNOSIS — Z803 Family history of malignant neoplasm of breast: Secondary | ICD-10-CM | POA: Diagnosis not present

## 2019-10-04 DIAGNOSIS — Z923 Personal history of irradiation: Secondary | ICD-10-CM | POA: Insufficient documentation

## 2019-10-04 DIAGNOSIS — N183 Chronic kidney disease, stage 3 unspecified: Secondary | ICD-10-CM | POA: Insufficient documentation

## 2019-10-04 DIAGNOSIS — Z5111 Encounter for antineoplastic chemotherapy: Secondary | ICD-10-CM | POA: Diagnosis present

## 2019-10-04 DIAGNOSIS — Z86718 Personal history of other venous thrombosis and embolism: Secondary | ICD-10-CM | POA: Insufficient documentation

## 2019-10-04 DIAGNOSIS — Z7984 Long term (current) use of oral hypoglycemic drugs: Secondary | ICD-10-CM | POA: Insufficient documentation

## 2019-10-04 DIAGNOSIS — Z9221 Personal history of antineoplastic chemotherapy: Secondary | ICD-10-CM | POA: Insufficient documentation

## 2019-10-04 LAB — COMPREHENSIVE METABOLIC PANEL
ALT: 22 U/L (ref 0–44)
AST: 29 U/L (ref 15–41)
Albumin: 3.5 g/dL (ref 3.5–5.0)
Alkaline Phosphatase: 78 U/L (ref 38–126)
Anion gap: 11 (ref 5–15)
BUN: 23 mg/dL (ref 8–23)
CO2: 20 mmol/L — ABNORMAL LOW (ref 22–32)
Calcium: 9.2 mg/dL (ref 8.9–10.3)
Chloride: 102 mmol/L (ref 98–111)
Creatinine, Ser: 1.12 mg/dL — ABNORMAL HIGH (ref 0.44–1.00)
GFR calc Af Amer: >60 mL/min (ref >60)
GFR calc non Af Amer: 52 mL/min — ABNORMAL LOW (ref >60)
Glucose, Bld: 235 mg/dL — ABNORMAL HIGH (ref 70–99)
Potassium: 3.9 mmol/L (ref 3.5–5.1)
Sodium: 133 mmol/L — ABNORMAL LOW (ref 135–145)
Total Bilirubin: 0.5 mg/dL (ref 0.3–1.2)
Total Protein: 7.3 g/dL (ref 6.5–8.1)

## 2019-10-04 LAB — CBC WITH DIFFERENTIAL/PLATELET
Abs Immature Granulocytes: 0 10*3/uL (ref 0.00–0.07)
Basophils Absolute: 0 10*3/uL (ref 0.0–0.1)
Basophils Relative: 1 %
Eosinophils Absolute: 0.1 10*3/uL (ref 0.0–0.5)
Eosinophils Relative: 3 %
HCT: 36.7 % (ref 36.0–46.0)
Hemoglobin: 11.5 g/dL — ABNORMAL LOW (ref 12.0–15.0)
Immature Granulocytes: 0 %
Lymphocytes Relative: 42 %
Lymphs Abs: 1.3 10*3/uL (ref 0.7–4.0)
MCH: 30.6 pg (ref 26.0–34.0)
MCHC: 31.3 g/dL (ref 30.0–36.0)
MCV: 97.6 fL (ref 80.0–100.0)
Monocytes Absolute: 0.1 10*3/uL (ref 0.1–1.0)
Monocytes Relative: 4 %
Neutro Abs: 1.5 10*3/uL — ABNORMAL LOW (ref 1.7–7.7)
Neutrophils Relative %: 50 %
Platelets: 248 10*3/uL (ref 150–400)
RBC: 3.76 MIL/uL — ABNORMAL LOW (ref 3.87–5.11)
RDW: 14.1 % (ref 11.5–15.5)
WBC: 3 10*3/uL — ABNORMAL LOW (ref 4.0–10.5)
nRBC: 0 % (ref 0.0–0.2)

## 2019-10-04 MED ORDER — FULVESTRANT 250 MG/5ML IM SOLN
500.0000 mg | Freq: Once | INTRAMUSCULAR | Status: AC
  Administered 2019-10-04: 12:00:00 500 mg via INTRAMUSCULAR
  Filled 2019-10-04: qty 10

## 2019-10-04 NOTE — Progress Notes (Signed)
.Person OFFICE PROGRESS NOTE  Patient Care Team: System, Pcp Not In as PCP - General Neldon Mc, MD (General Surgery) Everlene Farrier, MD (Obstetrics and Gynecology) Noreene Filbert, MD Forest Gleason, MD (Inactive) (Unknown Physician Specialty)  Cancer Staging Carcinoma of lower-outer quadrant of right breast in female, estrogen receptor positive [facility]) Staging form: Breast, AJCC 7th Edition - Pathologic: ypT1c,ypN2a, MX - Signed by Haywood Lasso, MD on 03/10/2012    Oncology History Overview Note  # DEC 2012- RIGHT BREAST CA T2 N2 M0 tumor from biopsy.  Estrogen receptor positive, Progesterone receptor positive.  Current receptor negative by FISH 2. Neoadjuvant chemotherapy started in December of 28 with Cytoxan Adriamycin 3. Started on Taxol weekly chemotherapy. 4. Patient finished 12  cycles of Taxol chemotherapy in May of 2013.     5. Status post right modified radical mastectomy [Dr.Bowers; [place]] June of 2013, ypT1c  yp N2  MO. started also on Lerazole    7. Radiation therapy to the right breast (September of 2013).  Lymph node was positive for HER-2 receptor gene amplification of 2.22.  Will proceed with Herceptin treatment starting in September of 2013.   8.Patient has finished Herceptin (maintenance therapy) in August of 2014 8. Start patient on letrozole from November, 2013. 9. Patient started on Herceptin in September 2013.    10Patient finished 12 months of Herceptin therapy on August, 2014  # 6. Status post left side prophylactic mastectomy.  #Late MAY 2018-RECURRENCE BREAST CA- ER positive/PR negative; ?? HER-2/neu- [biopsy- proven-mediastinal lymph node; in suff for her 2 testing].  [elevated Tumor marker- CT/PET- uptake in Right Mediastinal LN; Sternum [June 2018 EBUS- Dr.Kasa]   # March 10 2017- faslodex + Abema; OCT 5th CT-PR of mediastinal LN [consent]  #August 2020-left chest wall nodule biopsy  benign --------------------------------------------    DIAGNOSIS: [ BREAST CANCER- ER/PR/HER2 NEU POS  STAGE:  4   ;GOALS: PALLIATIVE  CURRENT/MOST RECENT THERAPY - ABEMA + FASLODEX    Carcinoma of lower-outer quadrant of right breast in female, estrogen receptor positive ([place])   INTERVAL HISTORY:  Kimberly [name] 64 y.o.  female pleasant patient above history of metastatic ER PR positive HER-2/neu positive breast cancer currently on abema+ Faslodex is here for follow-up.  Patient's treatment was interrupted/recently admitted to hospital for Covid related pneumonia.  She is recovered.  However she lost her husband-sudden death.  She is quite emotional; however coping up fairly well.  Denies any worsening shortness of breath or cough.  No swelling of the legs.  No nausea vomiting.  Chronic mild diarrhea.   Review of Systems  Constitutional: Positive for malaise/fatigue. Negative for chills, diaphoresis, fever and weight loss.  HENT: Negative for nosebleeds and sore throat.   Eyes: Negative for double vision.  Respiratory: Negative for cough, hemoptysis, sputum production, shortness of breath and wheezing.   Cardiovascular: Negative for chest pain, palpitations, orthopnea and leg swelling.  Gastrointestinal: Positive for diarrhea. Negative for abdominal pain, blood in stool, constipation, heartburn, melena, nausea and vomiting.  Genitourinary: Negative for dysuria, frequency and urgency.  Musculoskeletal: Positive for back pain and joint pain.  Neurological: Negative for dizziness, tingling, focal weakness, weakness and headaches.  Endo/Heme/Allergies: Does not bruise/bleed easily.  Psychiatric/Behavioral: Negative for depression. The patient is not nervous/anxious and does not have insomnia.       PAST MEDICAL HISTORY :  Past Medical History:  Diagnosis Date  . Arthritis   . Breast cancer ([place])    current in between  breast   . Cancer of lower-outer quadrant of female  breast ([place]) 08/06/2011   RIGHT   . High cholesterol   . History of kidney stones   . Hx of bilateral breast implants   . Hypertension   . PONV (postoperative nausea and vomiting)   . Type II diabetes mellitus ([facility])    fasting 140-150  . Vertigo    LAST WEEK  . Vertigo 01/2017    PAST SURGICAL HISTORY :   Past Surgical History:  Procedure Laterality Date  . BREAST BIOPSY  07/2011, 2020   right  . BREAST RECONSTRUCTION  03/07/2012   Procedure: BREAST RECONSTRUCTION;  Surgeon: Crissie Reese, MD;  Location: [facility];  Service: Plastics;  Laterality: Left;  BREAST RECONSTRUCTION WITH PLACEMENT OF TISSUE EXPANDER TO LEFT BREAST  . BREAST RECONSTRUCTION  02/06/2013  . CESAREAN SECTION  [id number]  . ENDOBRONCHIAL ULTRASOUND N/A 02/24/2017   Procedure: ENDOBRONCHIAL ULTRASOUND;  Surgeon: [name], MD;  Location: [facility];  Service: Cardiopulmonary;  Laterality: N/A;  . LATISSIMUS FLAP TO BREAST Right 02/06/2013   Procedure: LATISSIMUS FLAP TO RIGHT BREAST WITH IMPLANT;  Surgeon: Crissie Reese, MD;  Location: [facility];  Service: Plastics;  Laterality: Right;  . MASTECTOMY  03/07/12   modified right; total left  . MODIFIED MASTECTOMY  03/07/2012   Procedure: MODIFIED MASTECTOMY;  Surgeon: Haywood Lasso, MD;  Location: [facility];  Service: General;  Laterality: Right;  . PORTACATH PLACEMENT  07/2011  . SCAR REVISION  03/30/2012   Procedure: SCAR REVISION;  Surgeon: Haywood Lasso, MD;  Location: [place];  Service: General;  Laterality: Right;  CLOSURE OF MASTECTOMY INCISION  . WISDOM TOOTH EXTRACTION      FAMILY HISTORY :   Family History  Problem Relation Age of Onset  . Breast cancer Mother   . Diabetes Father     SOCIAL HISTORY:   Social History   Tobacco Use  . Smoking status: Never Smoker  . Smokeless tobacco: Never Used  Substance Use Topics  . Alcohol use: No  . Drug use: No    ALLERGIES:  is allergic to sulfa antibiotics.  MEDICATIONS:  Current  Outpatient Medications  Medication Sig Dispense Refill  . apixaban (ELIQUIS) 5 MG TABS tablet Take 1 tablet (5 mg total) by mouth 2 (two) times daily. 60 tablet 6  . atorvastatin (LIPITOR) 20 MG tablet Take 1 tablet (20 mg total) by mouth daily. 90 tablet 3  . betamethasone valerate (VALISONE) 0.1 % cream Apply 0.1 application topically as needed.    . Calcium Carbonate-Vitamin D (CALCIUM 600 + D PO) Take 1 tablet by mouth 2 (two) times daily.    . fulvestrant (FASLODEX) 250 MG/5ML injection Inject into the muscle every 30 (thirty) days. One injection each buttock over 1-2 minutes. Warm prior to use.    Marland Kitchen glipiZIDE (GLUCOTROL XL) 10 MG 24 hr tablet Take 1 tablet (10 mg total) by mouth 2 (two) times daily. 180 tablet 3  . insulin aspart (NOVOLOG FLEXPEN) 100 UNIT/ML FlexPen Inject 4 Units into the skin 3 (three) times daily with meals. 15 mL 0  . Insulin Detemir (LEVEMIR) 100 UNIT/ML Pen Inject 20 Units into the skin at bedtime. 15 mL 0  . levothyroxine (SYNTHROID) 25 MCG tablet TAKE 1 TABLET (25 MCG TOTAL) BY MOUTH DAILY BEFORE BREAKFAST. 90 tablet 1  . ondansetron (ZOFRAN) 8 MG tablet TAKE 1 TABLET (8 MG TOTAL) BY MOUTH EVERY 8 (EIGHT) HOURS AS NEEDED FOR NAUSEA  OR VOMITING. 20 tablet 3  . promethazine (PHENERGAN) 25 MG tablet Take 1 tablet (25 mg total) by mouth every 6 (six) hours as needed for nausea or vomiting. 30 tablet 0  . VERZENIO 100 MG tablet TAKE 1 TABLET BY MOUTH TWICE DAILY 56 tablet 3  . loperamide (IMODIUM A-D) 2 MG tablet Take 2 mg by mouth 4 (four) times daily as needed for diarrhea or loose stools.     No current facility-administered medications for this visit.    PHYSICAL EXAMINATION: ECOG PERFORMANCE STATUS: 1 - Symptomatic but completely ambulatory  BP (!) 146/82   Pulse (!) 103   Temp 98.2 F (36.8 C) (Tympanic)   Resp 20   Ht '5\' 2"'$  (1.575 m)   SpO2 99%   BMI 30.51 kg/m   There were no vitals filed for this visit.  Physical Exam  Constitutional: She is  oriented to person, place, and time and well-developed, well-nourished, and in no distress.  She is alone.   HENT:  Head: Normocephalic and atraumatic.  Mouth/Throat: Oropharynx is clear and moist. No oropharyngeal exudate.  Eyes: Pupils are equal, round, and reactive to light.  Cardiovascular: Normal rate and regular rhythm.  Pulmonary/Chest: No respiratory distress. She has no wheezes.  Abdominal: Soft. Bowel sounds are normal. She exhibits no distension and no mass. There is no abdominal tenderness. There is no rebound and no guarding.  Musculoskeletal:        General: No tenderness or edema. Normal range of motion.     Cervical back: Normal range of motion and neck supple.     Comments: Approximately 1 cm hard nodule felt in the anterior chest wall/left parasternal.  Neurological: She is alert and oriented to person, place, and time.  Skin: Skin is warm.  Psychiatric: Affect normal.    LABORATORY DATA:  I have reviewed the data as listed    Component Value Date/Time   NA 133 (L) 10/04/2019 1014   NA 135 11/28/2014 1401   K 3.9 10/04/2019 1014   K 4.7 11/28/2014 1401   CL 102 10/04/2019 1014   CL 99 (L) 11/28/2014 1401   CO2 20 (L) 10/04/2019 1014   CO2 28 11/28/2014 1401   GLUCOSE 235 (H) 10/04/2019 1014   GLUCOSE 215 (H) 11/28/2014 1401   BUN 23 10/04/2019 1014   BUN 16 11/28/2014 1401   CREATININE 1.12 (H) 10/04/2019 1014   CREATININE 0.96 11/28/2014 1401   CALCIUM 9.2 10/04/2019 1014   CALCIUM 9.9 11/28/2014 1401   PROT 7.3 10/04/2019 1014   PROT 7.6 11/28/2014 1401   ALBUMIN 3.5 10/04/2019 1014   ALBUMIN 4.5 11/28/2014 1401   AST 29 10/04/2019 1014   AST 26 11/28/2014 1401   ALT 22 10/04/2019 1014   ALT 28 11/28/2014 1401   ALKPHOS 78 10/04/2019 1014   ALKPHOS 78 11/28/2014 1401   BILITOT 0.5 10/04/2019 1014   BILITOT 0.5 11/28/2014 1401   GFRNONAA 52 (L) 10/04/2019 1014   GFRNONAA >60 11/28/2014 1401   GFRAA >60 10/04/2019 1014   GFRAA >60 11/28/2014 1401     No results found for: SPEP, UPEP  Lab Results  Component Value Date   WBC 3.0 (L) 10/04/2019   NEUTROABS 1.5 (L) 10/04/2019   HGB 11.5 (L) 10/04/2019   HCT 36.7 10/04/2019   MCV 97.6 10/04/2019   PLT 248 10/04/2019      Chemistry      Component Value Date/Time   NA 133 (L) 10/04/2019 1014  NA 135 11/28/2014 1401   K 3.9 10/04/2019 1014   K 4.7 11/28/2014 1401   CL 102 10/04/2019 1014   CL 99 (L) 11/28/2014 1401   CO2 20 (L) 10/04/2019 1014   CO2 28 11/28/2014 1401   BUN 23 10/04/2019 1014   BUN 16 11/28/2014 1401   CREATININE 1.12 (H) 10/04/2019 1014   CREATININE 0.96 11/28/2014 1401      Component Value Date/Time   CALCIUM 9.2 10/04/2019 1014   CALCIUM 9.9 11/28/2014 1401   ALKPHOS 78 10/04/2019 1014   ALKPHOS 78 11/28/2014 1401   AST 29 10/04/2019 1014   AST 26 11/28/2014 1401   ALT 22 10/04/2019 1014   ALT 28 11/28/2014 1401   BILITOT 0.5 10/04/2019 1014   BILITOT 0.5 11/28/2014 1401       RADIOGRAPHIC STUDIES: I have personally reviewed the radiological images as listed and agreed with the findings in the report. No results found.   ASSESSMENT & PLAN:  Carcinoma of lower-outer quadrant of right breast in female, estrogen receptor positive ([place])  # Recurrent breast cancer-ER positive PR negative ? HER-2/neu-currently on Faslodex plus abema.  June 2020 CT A/P-NEG. STABLE.        # Continue Faslodex plus Abema 100 BID; Labs today-white count low/ANC 1.5; monitor for now.  Will order CT scans today.  # History of right upper extremity DVT:on Eliquis-stable  # CKD- stage III- monitor closely; monitor blood glucose; stable  # I discussed regarding Covid-19 precautions.  I reviewed the vaccine effectiveness and potential side effects in detail.  Also discussed long-term effectiveness and safety profile are unclear at this time.  I discussed December, 2020 ASCO position statement-that all patients are recommended COVID-19 vaccinations [when available]-as  long as they do not have allergy to components of the vaccine.  However, I think the benefits of the vaccination outweigh the potential risks. Re: [id number] vaccination.   # DISPOSITION:will call results # Faslodex/today  # CT scan C/A/P [WEEK of 18th, feb]-  # Follow up in 1 month MD/ labs (CBC/CMP/Ca 27-29) Faslodex;-Dr.B   Orders Placed This Encounter  Procedures  . CT ABDOMEN PELVIS W CONTRAST    Standing Status:   Future    Standing Expiration Date:   10/03/2020    Order Specific Question:   If indicated for the ordered procedure, I authorize the administration of contrast media per Radiology protocol    Answer:   Yes    Order Specific Question:   Preferred imaging location?    Answer:   [facility]    Order Specific Question:   Radiology Contrast Protocol - do NOT remove file path    Answer:   [email or web address]    Order Specific Question:   ** REASON FOR EXAM (FREE TEXT)    Answer:   breast cancer  . CT Chest W Contrast    Standing Status:   Future    Standing Expiration Date:   10/03/2020    Order Specific Question:   ** REASON FOR EXAM (FREE TEXT)    Answer:   breast cancer    Order Specific Question:   If indicated for the ordered procedure, I authorize the administration of contrast media per Radiology protocol    Answer:   Yes    Order Specific Question:   Preferred imaging location?    Answer:   [facility]    Order Specific Question:   Radiology Contrast Protocol - do NOT remove file path    Answer:   \\  [email or web address]   All questions were answered. The patient knows to call the clinic with any problems, questions or concerns.      [name], MD 10/04/2019 1:17 PM

## 2019-10-04 NOTE — Assessment & Plan Note (Addendum)
#  Recurrent breast cancer-ER positive PR negative ? HER-2/neu-currently on Faslodex plus abema.  June 2020 CT A/P-NEG. STABLE.        # Continue Faslodex plus Abema 100 BID; Labs today-white count low/ANC 1.5; monitor for now.  Will order CT scans today.  # History of right upper extremity DVT:on Eliquis-stable  # CKD- stage III- monitor closely; monitor blood glucose; stable  # I discussed regarding Covid-19 precautions.  I reviewed the vaccine effectiveness and potential side effects in detail.  Also discussed long-term effectiveness and safety profile are unclear at this time.  I discussed December, 2020 ASCO position statement-that all patients are recommended COVID-19 vaccinations [when available]-as long as they do not have allergy to components of the vaccine.  However, I think the benefits of the vaccination outweigh the potential risks. Re: COVID-19 vaccination.   # DISPOSITION:will call results # Faslodex/today  # CT scan C/A/P [WEEK of 18th, feb]-  # Follow up in 1 month MD/ labs (CBC/CMP/Ca 27-29) Faslodex;-Dr.B  

## 2019-10-04 NOTE — Progress Notes (Signed)
Patient here for breast cancer f/u. She started verenzio on 09/17/2019. She is having difficulty sleeping r/t to anxiety and loss of her husband. She declines to start on any antianxiety medications at this time. Patient educated on mindfulness and relaxation techniques.

## 2019-10-05 LAB — CANCER ANTIGEN 27.29: CA 27.29: 35.2 U/mL (ref 0.0–38.6)

## 2019-10-11 ENCOUNTER — Telehealth: Payer: Self-pay | Admitting: Pharmacy Technician

## 2019-10-11 NOTE — Telephone Encounter (Addendum)
Oral Oncology Patient Advocate Encounter  Received notification from [facility] that prior authorization for Verzenio is required.  PA submitted on CoverMyMeds Key [id number]  Status is pending  Patients insurance plan changed.  New PA was required.  Oral Oncology Clinic will continue to follow.  [place] Patient [place] Phone [phone] Fax [phone] 10/11/2019 3:59 PM

## 2019-10-15 ENCOUNTER — Ambulatory Visit (HOSPITAL_COMMUNITY): Admission: RE | Admit: 2019-10-15 | Payer: Medicare Other | Source: Ambulatory Visit

## 2019-10-15 NOTE — Telephone Encounter (Signed)
Oral Oncology Patient Advocate Encounter  Prior Authorization for Kimberly [name] has been approved.    PA# [id number] Effective dates: 10/11/19 through 10/10/20  Oral Oncology Clinic will continue to follow.   [name] Phone [phone] Fax [phone] 10/15/2019 11:16 AM

## 2019-10-16 ENCOUNTER — Other Ambulatory Visit: Payer: Self-pay

## 2019-10-16 ENCOUNTER — Ambulatory Visit
Admission: RE | Admit: 2019-10-16 | Discharge: 2019-10-16 | Disposition: A | Discharge status: Home / Self Care | Payer: Medicare Other | Source: Ambulatory Visit | Attending: Internal Medicine | Admitting: Internal Medicine

## 2019-10-16 DIAGNOSIS — C50511 Malignant neoplasm of lower-outer quadrant of right female breast: Secondary | ICD-10-CM | POA: Diagnosis present

## 2019-10-16 DIAGNOSIS — Z170 Estrogen receptor positive status [ER+]: Secondary | ICD-10-CM | POA: Diagnosis present

## 2019-10-16 HISTORY — DX: Secondary malignant neoplasm of unspecified lung: C78.00

## 2019-10-16 MED ORDER — IOHEXOL 300 MG/ML  SOLN
85.0000 mL | Freq: Once | INTRAMUSCULAR | Status: AC | PRN
  Administered 2019-10-16: 12:00:00 85 mL via INTRAVENOUS

## 2019-10-19 ENCOUNTER — Telehealth: Payer: Self-pay | Admitting: Registered Nurse

## 2019-10-19 ENCOUNTER — Encounter: Payer: Self-pay | Admitting: Registered Nurse

## 2019-10-19 NOTE — Telephone Encounter (Signed)
Noted patient has been hospitalized in the past two months for covid and dehydration and due for 3 month diabetes follow up 11/01/2019.  Had oncology labs drawn this month but no HgbA1c please do POCT HgbA1c in office.  Also will be due microalbumin and foot exam.

## 2019-10-24 ENCOUNTER — Ambulatory Visit: Payer: Self-pay | Admitting: Registered Nurse

## 2019-10-24 ENCOUNTER — Telehealth: Payer: Self-pay | Admitting: Internal Medicine

## 2019-10-24 ENCOUNTER — Other Ambulatory Visit: Payer: Self-pay

## 2019-10-24 ENCOUNTER — Encounter: Payer: Self-pay | Admitting: Registered Nurse

## 2019-10-24 VITALS — BP 130/72 | HR 120 | Temp 98.8°F | Resp 12 | Ht 62.0 in | Wt 172.0 lb

## 2019-10-24 DIAGNOSIS — Z8616 Personal history of COVID-19: Secondary | ICD-10-CM

## 2019-10-24 DIAGNOSIS — E119 Type 2 diabetes mellitus without complications: Secondary | ICD-10-CM

## 2019-10-24 DIAGNOSIS — Z6831 Body mass index (BMI) 31.0-31.9, adult: Secondary | ICD-10-CM

## 2019-10-24 DIAGNOSIS — B351 Tinea unguium: Secondary | ICD-10-CM

## 2019-10-24 DIAGNOSIS — B353 Tinea pedis: Secondary | ICD-10-CM

## 2019-10-24 DIAGNOSIS — E079 Disorder of thyroid, unspecified: Secondary | ICD-10-CM

## 2019-10-24 DIAGNOSIS — N1831 Chronic kidney disease, stage 3a: Secondary | ICD-10-CM

## 2019-10-24 DIAGNOSIS — C50919 Malignant neoplasm of unspecified site of unspecified female breast: Secondary | ICD-10-CM

## 2019-10-24 DIAGNOSIS — I700 Atherosclerosis of aorta: Secondary | ICD-10-CM

## 2019-10-24 DIAGNOSIS — D638 Anemia in other chronic diseases classified elsewhere: Secondary | ICD-10-CM

## 2019-10-24 LAB — POCT GLYCOSYLATED HEMOGLOBIN (HGB A1C)
HbA1c, POC (controlled diabetic range): 7.7 % — AB (ref 0.0–7.0)
Hemoglobin A1C: 7.7 % — AB (ref 4.0–5.6)

## 2019-10-24 MED ORDER — NOVOLOG FLEXPEN 100 UNIT/ML ~~LOC~~ SOPN: 4.0000 [IU] | PEN_INJECTOR | Freq: Three times a day (TID) | SUBCUTANEOUS | 0 refills | Status: AC

## 2019-10-24 MED ORDER — INSULIN DETEMIR 100 UNIT/ML FLEXPEN: 20.0000 [IU] | PEN_INJECTOR | Freq: Every day | SUBCUTANEOUS | 0 refills | Status: DC

## 2019-10-24 NOTE — Patient Instructions (Signed)
Preventing Diabetes Mellitus Complications You can take action to prevent or slow down problems that are caused by diabetes (diabetes mellitus). Following your diabetes plan and taking care of yourself can reduce your risk of serious or life-threatening complications. What actions can I take to prevent diabetes complications? Manage your diabetes   Follow instructions from your health care providers about managing your diabetes. Your diabetes may be managed by a team of health care providers who can teach you how to care for yourself and can answer questions that you have.  Educate yourself about your condition so you can make healthy choices about eating and physical activity.  Check your blood sugar (glucose) levels as often as directed. Your health care provider will help you decide how often to check your blood glucose level depending on your treatment goals and how well you are meeting them.  Ask your health care provider if you should take low-dose aspirin daily and what dose is recommended for you. Taking low-dose aspirin daily is recommended to help prevent cardiovascular disease. Do not use nicotine or tobacco Do not use any products that contain nicotine or tobacco, such as cigarettes and e-cigarettes. If you need help quitting, ask your health care provider. Nicotine raises your risk for diabetes problems. If you quit using nicotine:  You will lower your risk for heart attack, stroke, nerve disease, and kidney disease.  Your cholesterol and blood pressure may improve.  Your blood circulation will improve. Keep your blood pressure under control Your personal target blood pressure is determined based on:  Your age.  Your medicines.  How long you have had diabetes.  Any other medical conditions you have. To control your blood pressure:  Follow instructions from your health care provider about meal planning, exercise, and medicines.  Make sure your health care provider  checks your blood pressure at every medical visit.  Monitor your blood pressure at home as told by your health care provider.  Keep your cholesterol under control To control your cholesterol:  Follow instructions from your health care provider about meal planning, exercise, and medicines.  Have your cholesterol checked at least once a year.  You may be prescribed medicine to lower cholesterol (statin). If you are not taking a statin, ask your health care provider if you should be. Controlling your cholesterol may:  Help prevent heart disease and stroke. These are the most common health problems for people with diabetes.  Improve your blood flow. Schedule and keep yearly physical exams and eye exams Your health care provider will tell you how often you need medical visits depending on your diabetes management plan. Keep all follow-up visits as directed. This is important so possible problems can be identified early and complications can be avoided or treated.  Every visit with your health care provider should include measuring your: ? Weight. ? Blood pressure. ? Blood glucose control.  Your A1c (hemoglobin A1c) level should be checked: ? At least 2 times a year, if you are meeting your treatment goals. ? 4 times a year, if you are not meeting treatment goals or if your treatment goals have changed.  Your blood lipids (lipid profile) should be checked yearly. You should also be checked yearly for protein in your urine (urine microalbumin).  If you have type 1 diabetes, get an eye exam 3-5 years after you are diagnosed, and then once a year after your first exam.  If you have type 2 diabetes, get an eye exam as soon as you  are diagnosed, and then once a year after your first exam. Keep your vaccines current It is recommended that you receive:  A flu (influenza) vaccine every year.  A pneumonia (pneumococcal) vaccine and a hepatitis B vaccine. If you are age 65 or older, you may  get the pneumonia vaccine as a series of two separate shots. Ask your health care provider which other vaccines may be recommended. Take care of your feet Diabetes may cause you to have poor blood circulation to your legs and feet. Because of this, taking care of your feet is very important. Diabetes can cause:  The skin on the feet to get thinner, break more easily, and heal more slowly.  Nerve damage in your legs and feet, which results in decreased feeling. You may not notice minor injuries that could lead to serious problems. To avoid foot problems:  Check your skin and feet every day for cuts, bruises, redness, blisters, or sores.  Schedule a foot exam with your health care provider once every year. This exam includes: ? Inspecting of the structure and skin of your feet. ? Checking the pulses and sensation in your feet.  Make sure that your health care provider performs a visual foot exam at every medical visit.  Take care of your teeth People with poorly controlled diabetes are more likely to have gum (periodontal) disease. Diabetes can make periodontal diseases harder to control. If not treated, periodontal diseases can lead to tooth loss. To prevent this:  Brush your teeth twice a day.  Floss at least once a day.  Visit your dentist 2 times a year. Drink responsibly Limit alcohol intake to no more than 1 drink a day for nonpregnant women and 2 drinks a day for men. One drink equals 12 oz of beer, 5 oz of wine, or 1 oz of hard liquor.  It is important to eat food when you drink alcohol to avoid low blood glucose (hypoglycemia). Avoid alcohol if you:  Have a history of alcohol abuse or dependence.  Are pregnant.  Have liver disease, pancreatitis, advanced neuropathy, or severe hypertriglyceridemia. Lessen stress Living with diabetes can be stressful. When you are experiencing stress, your blood glucose may be affected in two ways:  Stress hormones may cause your blood  glucose to rise.  You may be distracted from taking good care of yourself. Be aware of your stress level and make changes to help you manage challenging situations. To lower your stress levels:  Consider joining a support group.  Do planned relaxation or meditation.  Do a hobby that you enjoy.  Maintain healthy relationships.  Exercise regularly.  Work with your health care provider or a mental health professional. Summary  You can take action to prevent or slow down problems that are caused by diabetes (diabetes mellitus). Following your diabetes plan and taking care of yourself can reduce your risk of serious or life-threatening complications.  Follow instructions from your health care providers about managing your diabetes. Your diabetes may be managed by a team of health care providers who can teach you how to care for yourself and can answer questions that you have.  Your health care provider will tell you how often you need medical visits depending on your diabetes management plan. Keep all follow-up visits as directed. This is important so possible problems can be identified early and complications can be avoided or treated. This information is not intended to replace advice given to you by your health care provider. Make sure  you discuss any questions you have with your health care provider. Document Revised: 11/14/2017 Document Reviewed: 05/15/2016 Elsevier Patient Education  [place]. Diabetes Mellitus and Nutrition, Adult When you have diabetes (diabetes mellitus), it is very important to have healthy eating habits because your blood sugar (glucose) levels are greatly affected by what you eat and drink. Eating healthy foods in the appropriate amounts, at about the same times every day, can help you:  Control your blood glucose.  Lower your risk of heart disease.  Improve your blood pressure.  Reach or maintain a healthy weight. Every person with diabetes is  different, and each person has different needs for a meal plan. Your health care provider may recommend that you work with a diet and nutrition specialist (dietitian) to make a meal plan that is best for you. Your meal plan may vary depending on factors such as:  The calories you need.  The medicines you take.  Your weight.  Your blood glucose, blood pressure, and cholesterol levels.  Your activity level.  Other health conditions you have, such as heart or kidney disease. How do carbohydrates affect me? Carbohydrates, also called carbs, affect your blood glucose level more than any other type of food. Eating carbs naturally raises the amount of glucose in your blood. Carb counting is a method for keeping track of how many carbs you eat. Counting carbs is important to keep your blood glucose at a healthy level, especially if you use insulin or take certain oral diabetes medicines. It is important to know how many carbs you can safely have in each meal. This is different for every person. Your dietitian can help you calculate how many carbs you should have at each meal and for each snack. Foods that contain carbs include:  Bread, cereal, rice, pasta, and crackers.  Potatoes and corn.  Peas, beans, and lentils.  Milk and yogurt.  Fruit and juice.  Desserts, such as cakes, cookies, ice cream, and candy. How does alcohol affect me? Alcohol can cause a sudden decrease in blood glucose (hypoglycemia), especially if you use insulin or take certain oral diabetes medicines. Hypoglycemia can be a life-threatening condition. Symptoms of hypoglycemia (sleepiness, dizziness, and confusion) are similar to symptoms of having too much alcohol. If your health care provider says that alcohol is safe for you, follow these guidelines:  Limit alcohol intake to no more than 1 drink per day for nonpregnant women and 2 drinks per day for men. One drink equals 12 oz of beer, 5 oz of wine, or 1 oz of hard  liquor.  Do not drink on an empty stomach.  Keep yourself hydrated with water, diet soda, or unsweetened iced tea.  Keep in mind that regular soda, juice, and other mixers may contain a lot of sugar and must be counted as carbs. What are tips for following this plan?  Reading food labels  Start by checking the serving size on the "Nutrition Facts" label of packaged foods and drinks. The amount of calories, carbs, fats, and other nutrients listed on the label is based on one serving of the item. Many items contain more than one serving per package.  Check the total grams (g) of carbs in one serving. You can calculate the number of servings of carbs in one serving by dividing the total carbs by 15. For example, if a food has 30 g of total carbs, it would be equal to 2 servings of carbs.  Check the number of grams (  g) of saturated and trans fats in one serving. Choose foods that have low or no amount of these fats.  Check the number of milligrams (mg) of salt (sodium) in one serving. Most people should limit total sodium intake to less than 2,300 mg per day.  Always check the nutrition information of foods labeled as "low-fat" or "nonfat". These foods may be higher in added sugar or refined carbs and should be avoided.  Talk to your dietitian to identify your daily goals for nutrients listed on the label. Shopping  Avoid buying canned, premade, or processed foods. These foods tend to be high in fat, sodium, and added sugar.  Shop around the outside edge of the grocery store. This includes fresh fruits and vegetables, bulk grains, fresh meats, and fresh dairy. Cooking  Use low-heat cooking methods, such as baking, instead of high-heat cooking methods like deep frying.  Cook using healthy oils, such as olive, canola, or sunflower oil.  Avoid cooking with butter, cream, or high-fat meats. Meal planning  Eat meals and snacks regularly, preferably at the same times every day. Avoid going  long periods of time without eating.  Eat foods high in fiber, such as fresh fruits, vegetables, beans, and whole grains. Talk to your dietitian about how many servings of carbs you can eat at each meal.  Eat 4-6 ounces (oz) of lean protein each day, such as lean meat, chicken, fish, eggs, or tofu. One oz of lean protein is equal to: ? 1 oz of meat, chicken, or fish. ? 1 egg. ?  cup of tofu.  Eat some foods each day that contain healthy fats, such as avocado, nuts, seeds, and fish. Lifestyle  Check your blood glucose regularly.  Exercise regularly as told by your health care provider. This may include: ? 150 minutes of moderate-intensity or vigorous-intensity exercise each week. This could be brisk walking, biking, or water aerobics. ? Stretching and doing strength exercises, such as yoga or weightlifting, at least 2 times a week.  Take medicines as told by your health care provider.  Do not use any products that contain nicotine or tobacco, such as cigarettes and e-cigarettes. If you need help quitting, ask your health care provider.  Work with a Social worker or diabetes educator to identify strategies to manage stress and any emotional and social challenges. Questions to ask a health care provider  Do I need to meet with a diabetes educator?  Do I need to meet with a dietitian?  What number can I call if I have questions?  When are the best times to check my blood glucose? Where to find more information:  American Diabetes Association: diabetes.org  Academy of Nutrition and Dietetics: www.eatright.[employer] of Diabetes and Digestive and Kidney Diseases (NIH): [employer] Summary  A healthy meal plan will help you control your blood glucose and maintain a healthy lifestyle.  Working with a diet and nutrition specialist (dietitian) can help you make a meal plan that is best for you.  Keep in mind that carbohydrates (carbs) and alcohol have immediate  effects on your blood glucose levels. It is important to count carbs and to use alcohol carefully. This information is not intended to replace advice given to you by your health care provider. Make sure you discuss any questions you have with your health care provider. Document Revised: 07/29/2017 Document Reviewed: 09/20/2016 Elsevier Patient Education  [place]. Diabetes Mellitus and Sick Day Management Blood sugar (glucose) can be difficult to control  when you are sick. Common illnesses that can cause problems for people with diabetes (diabetes mellitus) include colds, fever, flu (influenza), nausea, vomiting, and diarrhea. These illnesses can cause stress and loss of body fluids (dehydration), and those issues can cause blood glucose levels to increase. Because of this, it is very important to take your insulin and diabetes medicines and eat some form of carbohydrate when you are sick. You should make a plan for days when you are sick (sick day plan) as part of your diabetes management plan. You and your health care provider should make this plan in advance. The following guidelines are intended to help you manage an illness that lasts for about 24 hours or less. Your health care provider may also give you more specific instructions. What do I need to do to manage my blood glucose?   Check your blood glucose every 2-4 hours, or as often as told by your health care provider.  Know your sick day treatment goals. Your target blood glucose levels may be different when you are sick.  If you use insulin, take your usual dose. ? If your blood glucose continues to be too high, you may need to take an additional insulin dose as told by your health care provider.  If you use oral diabetes medicine, you may need to stop taking it if you are not able to eat or drink normally. Ask your health care provider about whether you need to stop taking these medicines while you are sick.  If you use  injectable hormone medicines other than insulin to control your diabetes, ask your health care provider about whether you need to stop taking these medicines while you are sick. What else can I do to manage my diabetes when I am sick? Check your ketones  If you have type 1 diabetes, check your urine ketones every 4 hours.  If you have type 2 diabetes, check your urine ketones as often as told by your health care provider. Drink fluids  Drink enough fluid to keep your urine clear or pale yellow. This is especially important if you have a fever, vomiting, or diarrhea. Those symptoms can lead to dehydration.  Follow any instructions from your health care provider about beverages to avoid. ? Do not drink alcohol, caffeine, or drinks that contain a lot of sugar. Take medicines as directed  Take-over-the-counter and prescription medicines only as told by your health care provider.  Check medicine labels for added sugars. Some medicines may contain sugar or types of sugars that can raise your blood glucose level. What foods can I eat when I am sick?  You need to eat some form of carbohydrates when you are sick. You should eat 45-50 grams (45-50 g) of carbohydrates every 3-4 hours until you feel better. All of the food choices below contain about 15 g of carbohydrates. Plan ahead and keep some of these foods around so you have them if you get sick.  4-6 oz (120-177 mL) carbonated beverage that contains sugar, such as regular (not diet) soda. You may be able to drink carbonated beverages more easily if you open the beverage and let it sit at room temperature for a few minutes before drinking.   of a twin frozen ice pop.  4 oz (120 g) regular gelatin.  4 oz (120 mL) fruit juice.  4 oz (120 g) ice cream or frozen yogurt.  2 oz (60 g) sherbet.  8 oz (240 mL) clear broth or soup.  4 oz (120 g) regular custard.  4 oz (120 g) regular pudding.  8 oz (240 g) plain yogurt.  1 slice bread or  toast.  6 saltine crackers.  5 vanilla wafers. Questions to ask your health care provider Consider asking the following questions so you know what to do on days when you are sick:  Should I adjust my diabetes medicines?  How often do I need to check my blood glucose?  What supplies do I need to manage my diabetes at home when I am sick?  What number can I call if I have questions?  What foods and drinks should I avoid? Contact a health care provider if:  You develop symptoms of diabetic ketoacidosis, such as: ? Fatigue. ? Weight loss. ? Excessive thirst. ? Light-headedness. ? Fruity or sweet-smelling breath. ? Excessive urination. ? Vision changes. ? Confusion or irritability. ? Nausea. ? Vomiting. ? Rapid breathing. ? Pain in the abdomen. ? Feeling flushed.  You are unable to drink fluids without vomiting.  You have any of the following for more than 6 hours: ? Nausea. ? Vomiting. ? Diarrhea.  Your blood glucose is at or above 240 mg/dL (13.3 mmol/L), even after you take an additional insulin dose.  You have a change in how you think, feel, or act (mental status).  You develop another serious illness.  You have been sick or have had a fever for 2 days or longer and you are not getting better. Get help right away if:  Your blood glucose is lower than 54 mg/dL (3.0 mmol/L).  You have difficulty breathing.  You have moderate or high ketone levels in your urine.  You used emergency glucagon to treat low blood glucose. Summary  Blood sugar (glucose) can be difficult to control when you are sick. Common illnesses that can cause problems for people with diabetes (diabetes mellitus) include colds, fever, flu (influenza), nausea, vomiting, and diarrhea.  Illnesses can cause stress and loss of body fluids (dehydration), and those issues can cause blood glucose levels to increase.  Make a plan for days when you are sick (sick day plan) as part of your diabetes  management plan. You and your health care provider should make this plan in advance.  It is very important to take your insulin and diabetes medicines and to eat some form of carbohydrate when you are sick.  Contact your health care provider if have problems managing your blood glucose levels when you are sick, or if you have been sick or had a fever for 2 days or longer and are not getting better. This information is not intended to replace advice given to you by your health care provider. Make sure you discuss any questions you have with your health care provider. Document Revised: 05/14/2016 Document Reviewed: 05/14/2016 Elsevier Patient Education  [place]. Diabetes Mellitus and [place] care is an important part of your health, especially when you have diabetes. Diabetes may cause you to have problems because of poor blood flow (circulation) to your feet and legs, which can cause your skin to:  Become thinner and drier.  Break more easily.  Heal more slowly.  Peel and crack. You may also have nerve damage (neuropathy) in your legs and feet, causing decreased feeling in them. This means that you may not notice minor injuries to your feet that could lead to more serious problems. Noticing and addressing any potential problems early is the best way to prevent future foot problems.  How to care for your feet Foot hygiene  Wash your feet daily with warm water and mild soap. Do not use hot water. Then, pat your feet and the areas between your toes until they are completely dry. Do not soak your feet as this can dry your skin.  Trim your toenails straight across. Do not dig under them or around the cuticle. File the edges of your nails with an emery board or nail file.  Apply a moisturizing lotion or petroleum jelly to the skin on your feet and to dry, brittle toenails. Use lotion that does not contain alcohol and is unscented. Do not apply lotion between your toes. Shoes and  socks  Wear clean socks or stockings every day. Make sure they are not too tight. Do not wear knee-high stockings since they may decrease blood flow to your legs.  Wear shoes that fit properly and have enough cushioning. Always look in your shoes before you put them on to be sure there are no objects inside.  To break in new shoes, wear them for just a few hours a day. This prevents injuries on your feet. Wounds, scrapes, corns, and calluses  Check your feet daily for blisters, cuts, bruises, sores, and redness. If you cannot see the bottom of your feet, use a mirror or ask someone for help.  Do not cut corns or calluses or try to remove them with medicine.  If you find a minor scrape, cut, or break in the skin on your feet, keep it and the skin around it clean and dry. You may clean these areas with mild soap and water. Do not clean the area with peroxide, alcohol, or iodine.  If you have a wound, scrape, corn, or callus on your foot, look at it several times a day to make sure it is healing and not infected. Check for: ? Redness, swelling, or pain. ? Fluid or blood. ? Warmth. ? Pus or a bad smell. General instructions  Do not cross your legs. This may decrease blood flow to your feet.  Do not use heating pads or hot water bottles on your feet. They may burn your skin. If you have lost feeling in your feet or legs, you may not know this is happening until it is too late.  Protect your feet from hot and cold by wearing shoes, such as at the beach or on hot pavement.  Schedule a complete foot exam at least once a year (annually) or more often if you have foot problems. If you have foot problems, report any cuts, sores, or bruises to your health care provider immediately. Contact a health care provider if:  You have a medical condition that increases your risk of infection and you have any cuts, sores, or bruises on your feet.  You have an injury that is not healing.  You have  redness on your legs or feet.  You feel burning or tingling in your legs or feet.  You have pain or cramps in your legs and feet.  Your legs or feet are numb.  Your feet always feel cold.  You have pain around a toenail. Get help right away if:  You have a wound, scrape, corn, or callus on your foot and: ? You have pain, swelling, or redness that gets worse. ? You have fluid or blood coming from the wound, scrape, corn, or callus. ? Your wound, scrape, corn, or callus feels warm to the touch. ? You have pus or  a bad smell coming from the wound, scrape, corn, or callus. ? You have a fever. ? You have a red line going up your leg. Summary  Check your feet every day for cuts, sores, red spots, swelling, and blisters.  Moisturize feet and legs daily.  Wear shoes that fit properly and have enough cushioning.  If you have foot problems, report any cuts, sores, or bruises to your health care provider immediately.  Schedule a complete foot exam at least once a year (annually) or more often if you have foot problems. This information is not intended to replace advice given to you by your health care provider. Make sure you discuss any questions you have with your health care provider. Document Revised: 05/09/2019 Document Reviewed: 09/17/2016 Elsevier Patient Education  [place]  Athlete's foot (tinea pedis) is a fungal infection of the skin on your feet. It often occurs on the skin that is between or underneath the toes. It can also occur on the soles of your feet. The infection can spread from person to person (is contagious). It can also spread when a person's bare feet come in contact with the fungus on shower floors or on items such as shoes. What are the causes? This condition is caused by a fungus that grows in warm, moist places. You can get athlete's foot by sharing shoes, shower stalls, towels, and wet floors with someone who is infected. Not washing  your feet or changing your socks often enough can also lead to athlete's foot. What increases the risk? This condition is more likely to develop in:  Men.  People who have a weak body defense system (immune system).  People who have diabetes.  People who use public showers, such as at a gym.  People who wear heavy-duty shoes, such as Environmental manager.  Seasons with warm, humid weather. What are the signs or symptoms? Symptoms of this condition include:  Itchy areas between your toes or on the soles of your feet.  White, flaky, or scaly areas between your toes or on the soles of your feet.  Very itchy small blisters between your toes or on the soles of your feet.  Small cuts in your skin. These cuts can become infected.  Thick or discolored toenails. How is this diagnosed? This condition may be diagnosed with a physical exam and a review of your medical history. Your health care provider may also take a skin or toenail sample to examine under a microscope. How is this treated? This condition is treated with antifungal medicines. These may be applied as powders, ointments, or creams. In severe cases, an oral antifungal medicine may be given. Follow these instructions at home: Medicines  Apply or take over-the-counter and prescription medicines only as told by your health care provider.  Apply your antifungal medicine as told by your health care provider. Do not stop using the antifungal even if your condition improves. Foot care  Do not scratch your feet.  Keep your feet dry: ? Wear cotton or wool socks. Change your socks every day or if they become wet. ? Wear shoes that allow air to flow, such as sandals or canvas tennis shoes.  Wash and dry your feet, including the area between your toes. Also, wash and dry your feet: ? Every day or as told by your health care provider. ? After exercising. General instructions  Do not let others use towels, shoes, nail  clippers, or other personal items that  touch your feet.  Protect your feet by wearing sandals in wet areas, such as locker rooms and shared showers.  Keep all follow-up visits as told by your health care provider. This is important.  If you have diabetes, keep your blood sugar under control. Contact a health care provider if:  You have a fever.  You have swelling, soreness, warmth, or redness in your foot.  Your feet are not getting better with treatment.  Your symptoms get worse.  You have new symptoms. Summary  Athlete's foot (tinea pedis) is a fungal infection of the skin on your feet. It often occurs on skin that is between or underneath the toes.  This condition is caused by a fungus that grows in warm, moist places.  Symptoms include white, flaky, or scaly areas between your toes or on the soles of your feet.  This condition is treated with antifungal medicines.  Keep your feet clean. Always dry them thoroughly. This information is not intended to replace advice given to you by your health care provider. Make sure you discuss any questions you have with your health care provider. Document Revised: 08/11/2017 Document Reviewed: 06/06/2017 Elsevier Patient Education  [place]. Fungal Nail Infection A fungal nail infection is a common infection of the toenails or fingernails. This condition affects toenails more often than fingernails. It often affects the great, or big, toes. More than one nail may be infected. The condition can be passed from person to person (is contagious). What are the causes? This condition is caused by a fungus. Several types of fungi can cause the infection. These fungi are common in moist and warm areas. If your hands or feet come into contact with the fungus, it may get into a crack in your fingernail or toenail and cause the infection. What increases the risk? The following factors may make you more likely to develop this  condition:  Being female.  Being of older age.  Living with someone who has the fungus.  Walking barefoot in areas where the fungus thrives, such as showers or locker rooms.  Wearing shoes and socks that cause your feet to sweat.  Having a nail injury or a recent nail surgery.  Having certain medical conditions, such as: ? Athlete's foot. ? Diabetes. ? Psoriasis. ? Poor circulation. ? A weak body defense system (immune system). What are the signs or symptoms? Symptoms of this condition include:  A pale spot on the nail.  Thickening of the nail.  A nail that becomes yellow or brown.  A brittle or ragged nail edge.  A crumbling nail.  A nail that has lifted away from the nail bed. How is this diagnosed? This condition is diagnosed with a physical exam. Your health care provider may take a scraping or clipping from your nail to test for the fungus. How is this treated? Treatment is not needed for mild infections. If you have significant nail changes, treatment may include:  Antifungal medicines taken by mouth (orally). You may need to take the medicine for several weeks or several months, and you may not see the results for a long time. These medicines can cause side effects. Ask your health care provider what problems to watch for.  Antifungal nail polish or nail cream. These may be used along with oral antifungal medicines.  Laser treatment of the nail.  Surgery to remove the nail. This may be needed for the most severe infections. It can take a long time, usually up to  a year, for the infection to go away. The infection may also come back. Follow these instructions at home: Medicines  Take or apply over-the-counter and prescription medicines only as told by your health care provider.  Ask your health care provider about using over-the-counter mentholated ointment on your nails. Nail care  Trim your nails often.  Wash and dry your hands and feet every  day.  Keep your feet dry: ? Wear absorbent socks, and change your socks frequently. ? Wear shoes that allow air to circulate, such as sandals or canvas tennis shoes. Throw out old shoes.  Do not use artificial nails.  If you go to a nail salon, make sure you choose one that uses clean instruments.  Use antifungal foot powder on your feet and in your shoes. General instructions  Do not share personal items, such as towels or nail clippers.  Do not walk barefoot in shower rooms or locker rooms.  Wear rubber gloves if you are working with your hands in wet areas.  Keep all follow-up visits as told by your health care provider. This is important. Contact a health care provider if: Your infection is not getting better or it is getting worse after several months. Summary  A fungal nail infection is a common infection of the toenails or fingernails.  Treatment is not needed for mild infections. If you have significant nail changes, treatment may include taking medicine orally and applying medicine to your nails.  It can take a long time, usually up to a year, for the infection to go away. The infection may also come back.  Take or apply over-the-counter and prescription medicines only as told by your health care provider.  Follow instructions for taking care of your nails to help prevent infection from coming back or spreading. This information is not intended to replace advice given to you by your health care provider. Make sure you discuss any questions you have with your health care provider. Document Revised: 12/07/2018 Document Reviewed: 01/20/2018 Elsevier Patient Education  [place].

## 2019-10-24 NOTE — Telephone Encounter (Signed)
On 2/23-spoke to patient regarding results of the CT scan; follow-up as planned

## 2019-10-24 NOTE — Progress Notes (Signed)
Subjective:    Patient ID: Kimberly [name], female    DOB: Jul 31, 1956, 64 y.o.   MRN: [id number]  64y/o caucasian widowed female established patient here for follow up visit s/p covid/hospitalization.  Patient reported she was told to stop verzenio in hospital and hasn't restarted.  Patient reported she received remdesevir infusions in the hospital.  Developed pneumonia and ground glass on chest imaging 10/16/2019 oncology continuing to monitor.  Still fatigue/chronic with cancer treatment/anemia; notices not back to baseline stamina yet.  Was treated with zithromax/omnicef and rocephin.  Lisinopril stopped in the hospital.   Wondering when she should get her covid vaccine now.  Still under care of oncologist.  Spouse died unexpectedly from covid last month.  [employer] ends this month.  She needs to establish care with new PCM.  Planning to buy medicare advantage plan on open market.  Unfortunately her gyn provider not network with new plan but she is still going to stay with their office. Bilateral hand arthritis still having pain but topical creams/AROM helping no concerns at this time. Last office visit with me 05/31/2018  Summary of visit from paper chart: diabetes type II follow up was having nausea with peanuts and popcorn snacks (Patient was to schedule optometry appt and repeat HgbA1c in 3 months) and fatigue chronic with chemotherapy ongoing 18 months at that time and right arm DVT was on Eliquis x 2 months at that visit.  Swelling RUE circumference 13.5 above elbow and 11.5 distal  Left 12"/10" (continue eliquis).  BMI was 32 wt 179.6lbs BP 118/72 P 114; breast cancer ER positive (oncology f/u scheduled week after appt with me), dyslipidemia (TLC/continue atorvastatin), elevated creatinine (will be rechecked on oncology labs next week) and decreased GFR  Per paper chart review [facility] bilateral mastectomy and reconstructions 07/2011 after chemotherapy and  radiation/metastasis 2018; c-section x 2; colonoscopy 2010; PMHx: type II diabetes, overweight, breast cancer, dyslipidemia, hypertension, diverticulitis, osteoarthritis hands, allergic rhinitis  Medication Hx: Eliquis 05/2018-present; lisinopril 10mg  07/2016-05/2017/20mg  10/2014-03/2018; kombiglyze XR 2.12/998 05/2014-Sep 2017; Lantus solostar pen 44 units 09/2009 50 02/2014/51 units 05/2015; zyrtec 2018 from [place]; flonase 2018; glipizide ER 10mg  daily Jun 2018 increased to BID 07/2017-present; Ozempic 0.5mg  weekly 03/2017-2020; levothyroxine 52mcg historical/current; onglyza 5mg  09/2013; metformin 1000mg  BID 02/2014 ; lipitor 20mg  09/2013-current; herceptin dc'd; femra 2.5mg  daily; ferrous fumerate; meloxicam 15mg  daily 10/2014; protonix 40mg  before meals 05/2018; calcium 600mg  + vitamin D 05/2018; zofran 8mg  prn 05/2018; compazine 10mg  prn 05/2018; Faslodex 250mg /61ml 05/2018; Verzenio 100mg  BID 05/2018; Lamisil 250mg  05/2018; aspirin 81mg  05/2018; immodium 2mg  05/2018 to present Preferred pharmacy [facility]     Review of Systems  Constitutional: Positive for activity change, appetite change and fatigue. Negative for chills, diaphoresis, fever and unexpected weight change.  HENT: Negative for dental problem, ear discharge, ear pain, facial swelling, hearing loss, sinus pressure, sinus pain, trouble swallowing and voice change.   Eyes: Negative for photophobia, pain, discharge, redness, itching and visual disturbance.  Respiratory: Negative for cough, shortness of breath, wheezing and stridor.   Cardiovascular: Negative for chest pain, palpitations and leg swelling.  Gastrointestinal: Negative for abdominal pain, blood in stool, diarrhea and vomiting.  Endocrine: Negative for polydipsia, polyphagia and polyuria.  Genitourinary: Negative for difficulty urinating.  Musculoskeletal: Positive for arthralgias. Negative for back pain, gait problem, joint swelling, myalgias, neck pain and neck stiffness.   Skin: Negative for color change, pallor, rash and wound.  Allergic/Immunologic: Positive for environmental allergies and immunocompromised state. Negative  for food allergies.  Neurological: Negative for dizziness, tremors, seizures, syncope, facial asymmetry, speech difficulty, weakness, light-headedness, numbness and headaches.  Hematological: Negative for adenopathy. Does not bruise/bleed easily.  Psychiatric/Behavioral: Negative for agitation, confusion and sleep disturbance.       Objective:   Physical Exam Vitals and nursing note reviewed.  Constitutional:      General: She is awake. She is not in acute distress.    Appearance: Normal appearance. She is well-developed and well-groomed. She is obese. She is not ill-appearing, toxic-appearing or diaphoretic.  HENT:     Head: Normocephalic and atraumatic.     Jaw: There is normal jaw occlusion. No trismus, tenderness, swelling, pain on movement or malocclusion.     Salivary Glands: Right salivary gland is not diffusely enlarged or tender. Left salivary gland is not diffusely enlarged or tender.     Right Ear: Hearing, ear canal and external ear normal. A middle ear effusion is present.     Left Ear: Hearing, ear canal and external ear normal. A middle ear effusion is present.     Nose: Nose normal. No nasal deformity, septal deviation, signs of injury, laceration, nasal tenderness, mucosal edema, congestion or rhinorrhea.     Right Sinus: No maxillary sinus tenderness or frontal sinus tenderness.     Left Sinus: No maxillary sinus tenderness or frontal sinus tenderness.     Mouth/Throat:     Lips: Pink. No lesions.     Mouth: Mucous membranes are moist. Mucous membranes are not pale, not dry and not cyanotic. No injury, lacerations, oral lesions or angioedema.     Dentition: No dental caries, dental abscesses or gum lesions.     Tongue: No lesions. Tongue does not deviate from midline.     Palate: No mass and lesions.     Pharynx:  Oropharynx is clear. Uvula midline. No pharyngeal swelling, oropharyngeal exudate, posterior oropharyngeal erythema or uvula swelling.     Tonsils: No tonsillar exudate or tonsillar abscesses.  Eyes:     General: Lids are normal. Vision grossly intact. Gaze aligned appropriately. Allergic shiner present. No visual field deficit or scleral icterus.       Right eye: No foreign body, discharge or hordeolum.        Left eye: No foreign body, discharge or hordeolum.     Extraocular Movements: Extraocular movements intact.     Right eye: Normal extraocular motion and no nystagmus.     Left eye: Normal extraocular motion and no nystagmus.     Conjunctiva/sclera: Conjunctivae normal.     Right eye: Right conjunctiva is not injected. No chemosis, exudate or hemorrhage.    Left eye: Left conjunctiva is not injected. No chemosis, exudate or hemorrhage.    Pupils: Pupils are equal, round, and reactive to light. Pupils are equal.     Right eye: Pupil is round and reactive.     Left eye: Pupil is round and reactive.  Neck:     Thyroid: No thyroid mass, thyromegaly or thyroid tenderness.     Vascular: No carotid bruit.     Trachea: Trachea and phonation normal. No tracheal tenderness or tracheal deviation.  Cardiovascular:     Rate and Rhythm: Regular rhythm. Tachycardia present.     Chest Wall: PMI is not displaced.     Pulses:          Radial pulses are 2+ on the right side and 2+ on the left side.       Dorsalis pedis  pulses are 1+ on the right side and 1+ on the left side.       Posterior tibial pulses are 1+ on the right side and 1+ on the left side.     Heart sounds: Normal heart sounds, S1 normal and S2 normal. Heart sounds not distant. No murmur. No friction rub. No gallop.   Pulmonary:     Effort: Pulmonary effort is normal. No accessory muscle usage or respiratory distress.     Breath sounds: Normal breath sounds and air entry. No stridor, decreased air movement or transmitted upper airway  sounds. No decreased breath sounds, wheezing, rhonchi or rales.     Comments: Spoke full sentences without difficulty; wearing cloth mask due to covid 19 pandemic; no cough observed in exam room Chest:     Chest wall: No tenderness.  Abdominal:     General: Abdomen is flat. Bowel sounds are normal. There is no distension.     Palpations: Abdomen is soft. There is no shifting dullness, fluid wave, hepatomegaly, splenomegaly, mass or pulsatile mass.     Tenderness: There is no abdominal tenderness. There is no right CVA tenderness, left CVA tenderness, guarding or rebound. Negative signs include [name]'s sign.  Musculoskeletal:        General: No swelling, tenderness, deformity or signs of injury. Normal range of motion.     Right shoulder: Normal.     Left shoulder: Normal.     Right elbow: Normal.     Left elbow: Normal.     Right hand: No lacerations. Normal strength. Normal pulse.     Left hand: No lacerations. Normal strength. Normal pulse.     Cervical back: Normal, normal range of motion and neck supple. No swelling, edema, deformity, erythema, signs of trauma, lacerations, rigidity, torticollis, tenderness or crepitus. No pain with movement, spinous process tenderness or muscular tenderness. Normal range of motion.     Thoracic back: Normal.     Lumbar back: Normal.     Right hip: Normal.     Left hip: Normal.     Right knee: Normal.     Left knee: Normal.     Right lower leg: No edema.     Left lower leg: No edema.     Right ankle: Normal.     Right Achilles Tendon: Normal.     Left ankle: Normal.     Left Achilles Tendon: Normal.     Right foot: Normal range of motion. No deformity, bunion, Charcot foot or foot drop.     Left foot: Normal range of motion. No deformity, bunion, Charcot foot or foot drop.     Comments: Patient with bilateral hand stiffness/pain chronic arthritis  Feet:     Right foot:     Protective Sensation: 9 sites tested. 9 sites sensed.     Skin  integrity: Erythema, callus and dry skin present. No ulcer, blister, skin breakdown or fissure.     Toenail Condition: Right toenails are normal.     Left foot:     Protective Sensation: 9 sites tested. 9 sites sensed.     Skin integrity: Callus and dry skin present. No ulcer, blister, skin breakdown, erythema or fissure.     Toenail Condition: Left toenails are abnormally thick. Fungal disease present.    Comments: Moccasin foot scaling and erythema between toes right; left great toe with yellowing/opacity irregular shapes most pronounced distally with thickening/easily friable nail with subungal debris that extends to within 0.5cm of nail bed  for discoloration with opacity; bilateral feet/legs with fine scale dry skin patient reported she didn't have time to put on lotion today prior to appt; callus under MTP joints and heels mild Lymphadenopathy:     Head:     Right side of head: No submental, submandibular, tonsillar, preauricular, posterior auricular or occipital adenopathy.     Left side of head: No submental, submandibular, tonsillar, preauricular, posterior auricular or occipital adenopathy.     Cervical: No cervical adenopathy.     Right cervical: No superficial, deep or posterior cervical adenopathy.    Left cervical: No superficial, deep or posterior cervical adenopathy.  Skin:    General: Skin is warm and dry.     Capillary Refill: Capillary refill takes less than 2 seconds.     Coloration: Skin is not ashen, cyanotic, jaundiced, mottled, pale or sallow.     Findings: Erythema and rash present. No abrasion, abscess, acne, bruising, burn, ecchymosis, signs of injury, laceration, lesion, petechiae or wound. Rash is macular and scaling. Rash is not crusting, nodular, papular, purpuric, pustular, urticarial or vesicular.     Nails: There is no clubbing.     Comments: Feet/legs  Neurological:     General: No focal deficit present.     Mental Status: She is alert and oriented to person,  place, and time. Mental status is at baseline. She is not disoriented.     GCS: GCS eye subscore is 4. GCS verbal subscore is 5. GCS motor subscore is 6.     Cranial Nerves: Cranial nerves are intact. No cranial nerve deficit, dysarthria or facial asymmetry.     Sensory: Sensation is intact. No sensory deficit.     Motor: Motor function is intact. No weakness, tremor, atrophy, abnormal muscle tone or seizure activity.     Coordination: Coordination is intact. Coordination normal.     Gait: Gait is intact. Gait normal.     Comments: Gait sure and steady in hallway; on/off exam table and in/out of chair without difficulty; bilateral hand grasp equal 5/5 and upper/lower extremity strength equal bilaterally 5/5  Psychiatric:        Attention and Perception: Attention and perception normal.        Mood and Affect: Mood and affect normal.        Speech: Speech normal.        Behavior: Behavior normal. Behavior is cooperative.        Thought Content: Thought content normal.        Cognition and Memory: Cognition and memory normal.        Judgment: Judgment normal.    Repeat BP 120/80 HR 106  Patient with history chronic tachycardia noted at office visits [place] 2019, today Post hospitalization abdomen and chest CT IMPRESSION: 1. No evidence of metastatic disease in the chest, abdomen or pelvis. 2. Mild patchy ground-glass opacity and reticulation at the periphery of both lungs is new and compatible with resolving COVID-19 pneumonia or early postinflammatory fibrosis. If there is clinical concern for postinflammatory fibrosis, a follow-up high-resolution chest CT study could be obtained in 6 months. 3. Aortic Atherosclerosis (ICD10-I70.0). Additional chronic findings as detailed.   Electronically Signed   By: Ilona Sorrel M.D.   On: 10/16/2019 14:39  Results for Kimberly [name], Kimberly [name] (MRN [id number]) as of 10/26/2019 22:53  Ref. Range 10/04/2019 10:14 10/24/2019 11:45  Sodium  Latest Ref Range: 135 - 145 mmol/L 133 (L)   Potassium Latest Ref Range: 3.5 - 5.1 mmol/L  3.9   Chloride Latest Ref Range: 98 - 111 mmol/L 102   CO2 Latest Ref Range: 22 - 32 mmol/L 20 (L)   Glucose Latest Ref Range: 70 - 99 mg/dL 235 (H)   BUN Latest Ref Range: 8 - 23 mg/dL 23   Creatinine Latest Ref Range: 0.44 - 1.00 mg/dL 1.12 (H)   Calcium Latest Ref Range: 8.9 - 10.3 mg/dL 9.2   Anion gap Latest Ref Range: 5 - 15  11   Alkaline Phosphatase Latest Ref Range: 38 - 126 U/L 78   Albumin Latest Ref Range: 3.5 - 5.0 g/dL 3.5   AST Latest Ref Range: 15 - 41 U/L 29   ALT Latest Ref Range: 0 - 44 U/L 22   Total Protein Latest Ref Range: 6.5 - 8.1 g/dL 7.3   Total Bilirubin Latest Ref Range: 0.3 - 1.2 mg/dL 0.5   GFR, Est Non African American Latest Ref Range: >60 mL/min 52 (L)   GFR, Est African American Latest Ref Range: >60 mL/min >60   WBC Latest Ref Range: 4.0 - 10.5 K/uL 3.0 (L)   RBC Latest Ref Range: 3.87 - 5.11 MIL/uL 3.76 (L)   Hemoglobin Latest Ref Range: 12.0 - 15.0 g/dL 11.5 (L)   HCT Latest Ref Range: 36.0 - 46.0 % 36.7   MCV Latest Ref Range: 80.0 - 100.0 fL 97.6   MCH Latest Ref Range: 26.0 - 34.0 pg 30.6   MCHC Latest Ref Range: 30.0 - 36.0 g/dL 31.3   RDW Latest Ref Range: 11.5 - 15.5 % 14.1   Platelets Latest Ref Range: 150 - 400 K/uL 248   nRBC Latest Ref Range: 0.0 - 0.2 % 0.0   Neutrophils Latest Units: % 50   Lymphocytes Latest Units: % 42   Monocytes Relative Latest Units: % 4   Eosinophil Latest Units: % 3   Basophil Latest Units: % 1   Immature Granulocytes Latest Units: % 0   NEUT# Latest Ref Range: 1.7 - 7.7 K/uL 1.5 (L)   Lymphocyte # Latest Ref Range: 0.7 - 4.0 K/uL 1.3   Monocyte # Latest Ref Range: 0.1 - 1.0 K/uL 0.1   Eosinophils Absolute Latest Ref Range: 0.0 - 0.5 K/uL 0.1   Basophils Absolute Latest Ref Range: 0.0 - 0.1 K/uL 0.0   Abs Immature Granulocytes Latest Ref Range: 0.00 - 0.07 K/uL 0.00   Hemoglobin A1C Unknown  Pend  HbA1c, POC  (prediabetic range) Unknown  Pend  HbA1c, POC (controlled diabetic range) Latest Ref Range: 0.0 - 7.0 %  7.7 (A)  CA 27.29 Latest Ref Range: 0.0 - 38.6 U/mL 35.2    Lab review paper chart [facility] HgbA1c 09/2013 8.4 10/2013 8.3 02/2014 8.5 03/2014 8.9 10/2014 7.5 12/2016 10.8 Cr 0.90 GFR 69 01/2017 9.9 01/2018 7.1 04/2018 8.5 Cr 1.25 GFR 46 Lipids total/trigs/HDL/LDL  250/471/36/[place] 09/2013 03/2014 184/266/36/95 12/2016 241/316/45/133; 04/2018 108/205/37/41 TSH 5.25 09/2013 03/2014 4.3 12/2016 4.54  01/2018 3.99 06/2018 6.7 WBC/Hgb/Hct 09/2013 5.6/12.4/36.6; 03/2014 4.0/11.6/35.2; 12/2016 4.8/12.7/38.6    Assessment & Plan:  A-type II diabetes mellitus with insulin use; BMI 31.46, onychomycosis toenails, tinea pedis right foot, metastatic breast cancer, history of covid 19 disease, dyslipidemia, essential hypertension, CKD Stage III, allergic rhinitis, thyroid dysfunction, anemia of chronic disease, abdominal aortic atherosclerosis, history of right upper extremity DVT  P-HgbA1c improved from 8.8 to 7.7.  Patient needed refills of her insulin pens electronic Rx sent to her pharmacy of choice Novolog flexpen 4 units TID #15  RF0 and insulin detemir 100 units/ml sig 20 units inject at bedtime #15 RF0. Not taking ozempic will continue on insulin until blood sugars improve as 500 the other day.  Hospitalist stated return to ozempic once off steroids but due to home stressors/post covid lability of blood sugars continuing insulin.  Patient reported since hospitalization for covid her blood sugars elevated more than usual.  Added stressor death of spouse after hospitalization discharge unexpectedly.  This week [employer] expires end of Feb 2021 and changing to new insurance and establishing with new PCM.  Oncology labs due first week of March and she has follow up scheduled with her provider.  Not exercising too muddy outside no exercise equipment at home discussed walking in  house hourly 250 steps. Has not seen dentist in past year and I encouraged her to schedule a cleaning/exam.  Optometry scheduled for March 2021.  Covid vaccine have to wait 90 days after remdesevir (Dec 2020 hospitalization) + test 08/21/2019 and discuss with oncology next month also depends on her current oncology therapy also timing of covid vaccination.  microalbumin urine sample sent today patient provided sample at office visit.  Next HgbA1c recommended 3 months.  Patient checking her medicine bottles at home to see what refills she needs for her chronic conditions Congratulated patient on taking steps to improve her blood sugar control despite all the stressors/recent hospitalization.  Encouraged her to continue diet monitoring/trying to increase activity/taking medication every day and testing blood sugars.  Encouraged weight loss over the next year but emphasized healthy diet and activity daily main goals.  Continue glipizide XL 10mg  po BID.  Ensure staying hydrated drink water to keep urine pale yellow clear and voiding every 4 hours when awake.  Avoid dehydration.  Discussed increasing steps 10% per week goal to get 150 minutes per week and by 1 year 10000 steps per day.  Discussed do not have to do long duration but 5 minutes per hour adds up throughout the day/parking end of parking lots/taking stretch break at home/work every hour for quick walk up and down hallway to give cardiovascular benefits.  Consider carrying phone/using free app on phone to track steps.  Avoid added sugars in diet/non-nutritional greater than 25gms/5 teaspoons per day.  [place] handouts printed and given on diabetes and nutrition/sick care/foot care to patient.  Notify optometry provider diabetes diagnosis.  Also notify dentist of diabetes diagnosis as more frequent cleanings/services may be covered by insurance with diabetes.  Encouraged weight loss.  Apply emollient to feet each night e.g. vaseline/aquaphor/eucerin (squeeze  or scoop container not pump as pump has alcohol in it and more drying).  Do visual foot inspection every day when putting on socks/shoes check for redness/sores.  Discussed high blood sugars can affect nerve function/altered sensation and may not noticed hot spots (red areas)/blisters or cuts especially between toes.  Patient verbalized understanding information/instructions, agreed with plan of care and had no further questions at this time.  Since hospitalizations has not been taking verzenio.  She is to discuss with her oncology provider timing of restarting medication.  Oncology manages Verzenio and faslodex infusions. Anemia continues and oncology managing.  Oncology completed chest CT and continuing to monitor if ground glass resolving as she heals from covid/covid pneumonia.   Patient verbalized understanding information/instructions, agreed with plan of care  Cholesterol due in 6 months as elevated blood sugars/no exercise due to hospitalizations  Continue atorvastatin 20mg  po daily.  Patient agreed with plan of care  and verbalized understanding of information/instructions and had no further questions at this time.   She was instructed to stop lisinopril during hospitalization.  BP 130/72 today.  CKD Stage III  Continue to monitor BP/encourage tight glucose control.  HgbA1c improving off steroids with insulin.  Once lability of blood sugars decreases consider restarting ozempic.  Continue current medications as directed. Continue to monitor blood pressure at home and maintain log of blood pressure and pulse to bring to follow up appointments. Continue low sodium/DASH diet and restart exercise program  Eventual goal 150 minutes exercise/activity per week.  Recommended weight loss/weight maintenance to BMI 20-25. Return to the clinic if any new symptoms/notify clinic staff if visual changes, frequent headache, chest pain or dyspnea on mild or minimal exertion.  Patient verbalized agreement and  understanding of treatment plan and had no further questions at this time. P2: Diet and Exercise specific for HTN  Thickened toenails with subungal debris c/w onychomycosis  Patient going to try TLC and OTC therapy first.  Discussed toenails grow slow may take months of therapy up to or exceeding a year when entire toenail involved.  Sometimes oral therapy is needed.  If no improvement consider podiatry also. Discussed antifungal powder in footwear. Rotating footwear and placing footwear in sun to dry out between use.  Do not wear same pair of shoes every day.  Applying tea tree oil to nail daily alternative plan of care/holistic.  Caution as can sometimes cause skin irritation.  [place] Rx alternative but expensive. [facility] handout on onychomycosis given to patient.  Patient verbalized understanding and agreed with plan of care and had no further questions at this time.  Ill-defined erythema between toes no maceration and fine scale in a moccasin distribution right foot  OTC medication athletes foot spray or powder. Call or return to clinic as needed if these symptoms worsen or fail to improve as anticipated. Discussed hygiene with patient e.g. do not stay in sweat soaked clothes/change socks if wet. Discussed air out shoes in sun opening laces when weather permits, rotating shoes do not wear same pair every day to allow time to dry out.  May use over the counter athletes foot spray/powder in shoes also.   Reoccurrence of condition common and may require retreatment. Sometimes using blowdryer to dry skin thoroughly also helpful.  Patient verbalized agreement and understanding of treatment plan and had no further questions at this time. P2: Avoidance and hand washing.  Hypothyroidism continue 14mcg po daily.  Will need TSH recheck with labs next week overdue annual level.   Follow up sooner if side effects experienced e.g. palpitations, heat/cold intolerance, diarrhea, insomnia, hair loss, skin changes,  weight gain, trouble concentrating. Patient given [facility] handout on hypothyroidism. Patient verbalized understanding and agreed with plan of care and had no further questions at this time.   Patient may use normal saline nasal spray 2 sprays each nostril q2h wa as needed. flonase 68mcg 1 spray each nostril BID Patient denied personal or family history of ENT cancer.  OTC antihistamine of choice zyrtec 10mg  po daily.  Avoid triggers if possible.  Shower prior to bedtime if exposed to triggers.  If allergic dust/dust mites recommend mattress/pillow covers/encasements; washing linens, vacuuming, sweeping, dusting weekly.  Call or return to clinic as needed if these symptoms worsen or fail to improve as anticipated.  Patient verbalized understanding of instructions, agreed with plan of care and had no further questions at this time.  P2:  Avoidance and hand washing.  Aortic atherosclerosis incidental finding on chest/abdominal CT patient on statin.    Continue eliquis 5mg  po BID for prevention reoccur ence DVT.  2019 RUE DVT.  Currently managed by oncology.

## 2019-10-25 LAB — MICROALBUMIN / CREATININE URINE RATIO
Creatinine, Urine: 109.1 mg/dL
Microalb/Creat Ratio: 10 mg/g creat (ref 0–29)
Microalbumin, Urine: 11.2 ug/mL

## 2019-10-26 ENCOUNTER — Encounter: Payer: Self-pay | Admitting: Registered Nurse

## 2019-10-26 ENCOUNTER — Telehealth: Payer: Self-pay | Admitting: Registered Nurse

## 2019-10-26 DIAGNOSIS — B351 Tinea unguium: Secondary | ICD-10-CM | POA: Insufficient documentation

## 2019-10-26 DIAGNOSIS — B353 Tinea pedis: Secondary | ICD-10-CM | POA: Insufficient documentation

## 2019-10-26 DIAGNOSIS — N1831 Chronic kidney disease, stage 3a: Secondary | ICD-10-CM | POA: Insufficient documentation

## 2019-10-26 DIAGNOSIS — I700 Atherosclerosis of aorta: Secondary | ICD-10-CM | POA: Insufficient documentation

## 2019-10-26 DIAGNOSIS — Z6831 Body mass index (BMI) 31.0-31.9, adult: Secondary | ICD-10-CM | POA: Insufficient documentation

## 2019-10-26 DIAGNOSIS — Z8616 Personal history of COVID-19: Secondary | ICD-10-CM | POA: Insufficient documentation

## 2019-10-26 NOTE — Telephone Encounter (Signed)
My chart message sent to patient noted last TSH level greater than 1 year ago.  Need recheck/ordered and patient has lab draw planned with oncology this upcoming week.  Also to check with patient if any of her medicine bottles at home with no refills on label requiring refill prior to insurance change Monday.

## 2019-10-27 NOTE — Telephone Encounter (Signed)
Telephone message left with same instructions/information as my chart message today at 1048.  I let patient know I would try to reach her again this afternoon/evening by telephone or she can reply to me via mychart.

## 2019-10-27 NOTE — Telephone Encounter (Signed)
Telephone message left for patient to verify she does not need any refills and to have TSH drawn at oncology this week also.  Reminded patient she can reach me via mychart over the weekend when clinic closed.

## 2019-10-28 NOTE — Telephone Encounter (Signed)
Patient replied via mychart.  No further Rx refills needed and she will discuss TSH draw with oncology this week.

## 2019-10-29 ENCOUNTER — Telehealth: Payer: Self-pay | Admitting: Pharmacy Technician

## 2019-10-30 NOTE — Telephone Encounter (Signed)
Oral Oncology Patient Advocate Encounter  Coordinated with patient to complete application for [employer] Patient Assistance program in an effort to reduce patient's out of pocket expense for Verzenio to $0.    Application completed and faxed to [phone] on 10/29/2019.   [employer] Patient Assistance program phone number for follow up is [phone].   Patient had insurance change as of 10/29/19 and copay for Kimberly [name] is $2886.20 for the first fill.  This encounter will be updated until final determination.   [place] Patient [place] Phone [phone] Fax [phone] 10/30/2019 11:48 AM

## 2019-11-01 ENCOUNTER — Inpatient Hospital Stay: Payer: PPO

## 2019-11-01 ENCOUNTER — Other Ambulatory Visit: Payer: Self-pay

## 2019-11-01 ENCOUNTER — Inpatient Hospital Stay: Payer: PPO | Attending: Internal Medicine

## 2019-11-01 ENCOUNTER — Inpatient Hospital Stay: Payer: PPO | Admitting: Internal Medicine

## 2019-11-01 VITALS — BP 108/76 | HR 99 | Temp 97.4°F | Wt 172.0 lb

## 2019-11-01 DIAGNOSIS — E7800 Pure hypercholesterolemia, unspecified: Secondary | ICD-10-CM | POA: Insufficient documentation

## 2019-11-01 DIAGNOSIS — C50511 Malignant neoplasm of lower-outer quadrant of right female breast: Secondary | ICD-10-CM | POA: Diagnosis not present

## 2019-11-01 DIAGNOSIS — M79601 Pain in right arm: Secondary | ICD-10-CM | POA: Diagnosis not present

## 2019-11-01 DIAGNOSIS — E119 Type 2 diabetes mellitus without complications: Secondary | ICD-10-CM | POA: Insufficient documentation

## 2019-11-01 DIAGNOSIS — Z803 Family history of malignant neoplasm of breast: Secondary | ICD-10-CM | POA: Diagnosis not present

## 2019-11-01 DIAGNOSIS — E079 Disorder of thyroid, unspecified: Secondary | ICD-10-CM

## 2019-11-01 DIAGNOSIS — I10 Essential (primary) hypertension: Secondary | ICD-10-CM | POA: Insufficient documentation

## 2019-11-01 DIAGNOSIS — M7989 Other specified soft tissue disorders: Secondary | ICD-10-CM | POA: Diagnosis not present

## 2019-11-01 DIAGNOSIS — Z7901 Long term (current) use of anticoagulants: Secondary | ICD-10-CM | POA: Insufficient documentation

## 2019-11-01 DIAGNOSIS — Z9013 Acquired absence of bilateral breasts and nipples: Secondary | ICD-10-CM | POA: Diagnosis not present

## 2019-11-01 DIAGNOSIS — Z79899 Other long term (current) drug therapy: Secondary | ICD-10-CM | POA: Diagnosis not present

## 2019-11-01 DIAGNOSIS — Z833 Family history of diabetes mellitus: Secondary | ICD-10-CM | POA: Diagnosis not present

## 2019-11-01 DIAGNOSIS — N183 Chronic kidney disease, stage 3 unspecified: Secondary | ICD-10-CM | POA: Diagnosis not present

## 2019-11-01 DIAGNOSIS — Z794 Long term (current) use of insulin: Secondary | ICD-10-CM | POA: Insufficient documentation

## 2019-11-01 DIAGNOSIS — Z170 Estrogen receptor positive status [ER+]: Secondary | ICD-10-CM | POA: Insufficient documentation

## 2019-11-01 LAB — CBC WITH DIFFERENTIAL/PLATELET
Abs Immature Granulocytes: 0.01 10*3/uL (ref 0.00–0.07)
Basophils Absolute: 0.1 10*3/uL (ref 0.0–0.1)
Basophils Relative: 2 %
Eosinophils Absolute: 0 10*3/uL (ref 0.0–0.5)
Eosinophils Relative: 1 %
HCT: 32.7 % — ABNORMAL LOW (ref 36.0–46.0)
Hemoglobin: 10.7 g/dL — ABNORMAL LOW (ref 12.0–15.0)
Immature Granulocytes: 0 %
Lymphocytes Relative: 48 %
Lymphs Abs: 1.4 10*3/uL (ref 0.7–4.0)
MCH: 31.4 pg (ref 26.0–34.0)
MCHC: 32.7 g/dL (ref 30.0–36.0)
MCV: 95.9 fL (ref 80.0–100.0)
Monocytes Absolute: 0.2 10*3/uL (ref 0.1–1.0)
Monocytes Relative: 7 %
Neutro Abs: 1.2 10*3/uL — ABNORMAL LOW (ref 1.7–7.7)
Neutrophils Relative %: 42 %
Platelets: 270 10*3/uL (ref 150–400)
RBC: 3.41 MIL/uL — ABNORMAL LOW (ref 3.87–5.11)
RDW: 14.9 % (ref 11.5–15.5)
WBC: 2.9 10*3/uL — ABNORMAL LOW (ref 4.0–10.5)
nRBC: 0 % (ref 0.0–0.2)

## 2019-11-01 LAB — COMPREHENSIVE METABOLIC PANEL
ALT: 17 U/L (ref 0–44)
AST: 27 U/L (ref 15–41)
Albumin: 3.7 g/dL (ref 3.5–5.0)
Alkaline Phosphatase: 68 U/L (ref 38–126)
Anion gap: 10 (ref 5–15)
BUN: 22 mg/dL (ref 8–23)
CO2: 20 mmol/L — ABNORMAL LOW (ref 22–32)
Calcium: 8.9 mg/dL (ref 8.9–10.3)
Chloride: 101 mmol/L (ref 98–111)
Creatinine, Ser: 1.17 mg/dL — ABNORMAL HIGH (ref 0.44–1.00)
GFR calc Af Amer: 57 mL/min — ABNORMAL LOW (ref >60)
GFR calc non Af Amer: 49 mL/min — ABNORMAL LOW (ref >60)
Glucose, Bld: 230 mg/dL — ABNORMAL HIGH (ref 70–99)
Potassium: 4 mmol/L (ref 3.5–5.1)
Sodium: 131 mmol/L — ABNORMAL LOW (ref 135–145)
Total Bilirubin: 0.5 mg/dL (ref 0.3–1.2)
Total Protein: 7 g/dL (ref 6.5–8.1)

## 2019-11-01 MED ORDER — FULVESTRANT 250 MG/5ML IM SOLN
500.0000 mg | Freq: Once | INTRAMUSCULAR | Status: AC
  Administered 2019-11-01: 500 mg via INTRAMUSCULAR
  Filled 2019-11-01: qty 10

## 2019-11-01 NOTE — Assessment & Plan Note (Addendum)
#  Recurrent breast cancer-ER positive PR negative ? HER-2/neu-currently on Faslodex plus abema.  FEB 2021- CT A/P-NEG. STABLE-no evidence of any progressive metastatic disease; groundglass opacities improving [recent Covid infection].        # Continue Faslodex plus Abema 100 BID; Labs today-white count low/ANC 1.2; monitor for now.   # History of right upper extremity DVT:on Eliquis-stable.  However patient concerned about price of the medication/need for continued anticoagulation.  Will get ultrasound next week  # CKD- stage III- monitor closely; monitor blood glucose- stable.   #Encouraged the patient to get her Covid vaccination; discussed the pros and cons.  Since patient did not get any immunoglobulins/monoclonal antibody-I think she is still a candidate for vaccine at this time   #DISPOSITION: # dopplers of Right UE in 1 week # Faslodex/today  # Follow up in 1 month MD/ labs (CBC/CMP/Ca 27-29) Faslodex;-Dr.B   # I reviewed the blood work- with the patient in detail; also reviewed the imaging independently [as summarized above]; and with the patient in detail.

## 2019-11-01 NOTE — Progress Notes (Signed)
.[facility] OFFICE PROGRESS NOTE  Patient Care Team: System, Pcp Not In as PCP - General Neldon Mc, MD (General Surgery) Everlene Farrier, MD (Obstetrics and Gynecology) Noreene Filbert, MD Forest Gleason, MD (Inactive) (Unknown Physician Specialty)  Cancer Staging Carcinoma of lower-outer quadrant of right breast in female, estrogen receptor positive [facility]) Staging form: Breast, AJCC 7th Edition - Pathologic: ypT1c,ypN2a, MX - Signed by Haywood Lasso, MD on 03/10/2012    Oncology History Overview Note  # DEC 2012- RIGHT BREAST CA T2 N2 M0 tumor from biopsy.  Estrogen receptor positive, Progesterone receptor positive.  Current receptor negative by FISH 2. Neoadjuvant chemotherapy started in December of 28 with Cytoxan Adriamycin 3. Started on Taxol weekly chemotherapy. 4. Patient finished 12  cycles of Taxol chemotherapy in May of 2013.     5. Status post right modified radical mastectomy [Dr.Bowers; [place]] June of 2013, ypT1c  yp N2  MO. started also on Lerazole    7. Radiation therapy to the right breast (September of 2013).  Lymph node was positive for HER-2 receptor gene amplification of 2.22.  Will proceed with Herceptin treatment starting in September of 2013.   8.Patient has finished Herceptin (maintenance therapy) in August of 2014 8. Start patient on letrozole from November, 2013. 9. Patient started on Herceptin in September 2013.    10Patient finished 12 months of Herceptin therapy on August, 2014  # 6. Status post left side prophylactic mastectomy.  #Late MAY 2018-RECURRENCE BREAST CA- ER positive/PR negative; ?? HER-2/neu- [biopsy- proven-mediastinal lymph node; in suff for her 2 testing].  [elevated Tumor marker- CT/PET- uptake in Right Mediastinal LN; Sternum [June 2018 EBUS- Dr.Kasa]   # March 10 2017- faslodex + Abema; OCT 5th CT-PR of mediastinal LN [consent]  #August 2020-left chest wall nodule biopsy  benign --------------------------------------------    DIAGNOSIS: [ BREAST CANCER- ER/PR/HER2 NEU POS  STAGE:  4   ;GOALS: PALLIATIVE  CURRENT/MOST RECENT THERAPY - ABEMA + FASLODEX    Carcinoma of lower-outer quadrant of right breast in female, estrogen receptor positive ([place])   INTERVAL HISTORY:  Kimberly [name] 64 y.o.  female pleasant patient above history of metastatic ER PR positive HER-2/neu positive breast cancer currently on abema+ Faslodex is here for follow-up/review results of the CT scan.  Patient seems to be coping well with the recent loss of her husband.  However still getting emotional  Patient denies any worsening shortness of breath or cough.  Denies any nausea vomiting abdominal pain. Chronic mild diarrhea.   Review of Systems  Constitutional: Positive for malaise/fatigue. Negative for chills, diaphoresis, fever and weight loss.  HENT: Negative for nosebleeds and sore throat.   Eyes: Negative for double vision.  Respiratory: Negative for cough, hemoptysis, sputum production, shortness of breath and wheezing.   Cardiovascular: Negative for chest pain, palpitations, orthopnea and leg swelling.  Gastrointestinal: Positive for diarrhea. Negative for abdominal pain, blood in stool, constipation, heartburn, melena, nausea and vomiting.  Genitourinary: Negative for dysuria, frequency and urgency.  Musculoskeletal: Positive for back pain and joint pain.  Neurological: Negative for dizziness, tingling, focal weakness, weakness and headaches.  Endo/Heme/Allergies: Does not bruise/bleed easily.  Psychiatric/Behavioral: Negative for depression. The patient is not nervous/anxious and does not have insomnia.       PAST MEDICAL HISTORY :  Past Medical History:  Diagnosis Date  . Arthritis   . Cancer of lower-outer quadrant of female breast ([place]) 08/06/2011   RIGHT, chemo and bilateral mastectomies.  . High cholesterol   .  History of kidney stones   . Hx of bilateral  breast implants   . Hypertension   . Lung metastases ([place]) 20118   chemo pills and faslidex shots.  Marland Kitchen PONV (postoperative nausea and vomiting)   . Type II diabetes mellitus ([facility])    fasting 140-150  . Vertigo    LAST WEEK  . Vertigo 01/2017    PAST SURGICAL HISTORY :   Past Surgical History:  Procedure Laterality Date  . BREAST BIOPSY  07/2011, 2020   right  . BREAST RECONSTRUCTION  03/07/2012   Procedure: BREAST RECONSTRUCTION;  Surgeon: Crissie Reese, MD;  Location: [place];  Service: Plastics;  Laterality: Left;  BREAST RECONSTRUCTION WITH PLACEMENT OF TISSUE EXPANDER TO LEFT BREAST  . BREAST RECONSTRUCTION  02/06/2013  . CESAREAN SECTION  [id number]  . ENDOBRONCHIAL ULTRASOUND N/A 02/24/2017   Procedure: ENDOBRONCHIAL ULTRASOUND;  Surgeon: [name], MD;  Location: [facility];  Service: Cardiopulmonary;  Laterality: N/A;  . LATISSIMUS FLAP TO BREAST Right 02/06/2013   Procedure: LATISSIMUS FLAP TO RIGHT BREAST WITH IMPLANT;  Surgeon: Crissie Reese, MD;  Location: [place];  Service: Plastics;  Laterality: Right;  . MASTECTOMY  03/07/12   modified right; total left  . MODIFIED MASTECTOMY  03/07/2012   Procedure: MODIFIED MASTECTOMY;  Surgeon: Haywood Lasso, MD;  Location[place];  Service: General;  Laterality: Right;  . PORTACATH PLACEMENT  07/2011  . SCAR REVISION  03/30/2012   Procedure: SCAR REVISION;  Surgeon: Haywood Lasso, MD;  Location: [place];  Service: General;  Laterality: Right;  CLOSURE OF MASTECTOMY INCISION  . WISDOM TOOTH EXTRACTION      FAMILY HISTORY :   Family History  Problem Relation Age of Onset  . Breast cancer Mother   . Diabetes Father     SOCIAL HISTORY:   Social History   Tobacco Use  . Smoking status: Never Smoker  . Smokeless tobacco: Never Used  Substance Use Topics  . Alcohol use: No  . Drug use: No    ALLERGIES:  is allergic to sulfa antibiotics.  MEDICATIONS:  Current Outpatient Medications  Medication Sig  Dispense Refill  . apixaban (ELIQUIS) 5 MG TABS tablet Take 1 tablet (5 mg total) by mouth 2 (two) times daily. 60 tablet 6  . atorvastatin (LIPITOR) 20 MG tablet Take 1 tablet (20 mg total) by mouth daily. 90 tablet 3  . betamethasone valerate (VALISONE) 0.1 % cream Apply 0.1 application topically as needed.    . Calcium Carbonate-Vitamin D (CALCIUM 600 + D PO) Take 1 tablet by mouth 2 (two) times daily.    . fulvestrant (FASLODEX) 250 MG/5ML injection Inject into the muscle every 30 (thirty) days. One injection each buttock over 1-2 minutes. Warm prior to use.    Marland Kitchen glipiZIDE (GLUCOTROL XL) 10 MG 24 hr tablet Take 1 tablet (10 mg total) by mouth 2 (two) times daily. 180 tablet 3  . insulin aspart (NOVOLOG FLEXPEN) 100 UNIT/ML FlexPen Inject 4 Units into the skin 3 (three) times daily with meals. 15 mL 0  . Insulin Detemir (LEVEMIR) 100 UNIT/ML Pen Inject 20 Units into the skin at bedtime. 15 mL 0  . levothyroxine (SYNTHROID) 25 MCG tablet TAKE 1 TABLET (25 MCG TOTAL) BY MOUTH DAILY BEFORE BREAKFAST. 90 tablet 1  . loperamide (IMODIUM A-D) 2 MG tablet Take 2 mg by mouth 4 (four) times daily as needed for diarrhea or loose stools.    . ondansetron (ZOFRAN) 8 MG tablet TAKE  1 TABLET (8 MG TOTAL) BY MOUTH EVERY 8 (EIGHT) HOURS AS NEEDED FOR NAUSEA OR VOMITING. 20 tablet 3  . promethazine (PHENERGAN) 25 MG tablet Take 1 tablet (25 mg total) by mouth every 6 (six) hours as needed for nausea or vomiting. 30 tablet 0  . VERZENIO 100 MG tablet TAKE 1 TABLET BY MOUTH TWICE DAILY 56 tablet 3   No current facility-administered medications for this visit.    PHYSICAL EXAMINATION: ECOG PERFORMANCE STATUS: 1 - Symptomatic but completely ambulatory  BP 108/76 (BP Location: Left Arm, Patient Position: Sitting, Cuff Size: Normal)   Pulse 99   Temp (!) 97.4 F (36.3 C) (Tympanic)   Wt 172 lb (78 kg)   BMI 31.46 kg/m   Filed Weights   11/01/19 1033  Weight: 172 lb (78 kg)    Physical Exam   Constitutional: She is oriented to person, place, and time and well-developed, well-nourished, and in no distress.  She is alone.   HENT:  Head: Normocephalic and atraumatic.  Mouth/Throat: Oropharynx is clear and moist. No oropharyngeal exudate.  Eyes: Pupils are equal, round, and reactive to light.  Cardiovascular: Normal rate and regular rhythm.  Pulmonary/Chest: No respiratory distress. She has no wheezes.  Abdominal: Soft. Bowel sounds are normal. She exhibits no distension and no mass. There is no abdominal tenderness. There is no rebound and no guarding.  Musculoskeletal:        General: No tenderness or edema. Normal range of motion.     Cervical back: Normal range of motion and neck supple.     Comments: Approximately 1 cm hard nodule felt in the anterior chest wall/left parasternal.  Neurological: She is alert and oriented to person, place, and time.  Skin: Skin is warm.  Psychiatric: Affect normal.    LABORATORY DATA:  I have reviewed the data as listed    Component Value Date/Time   NA 131 (L) 11/01/2019 1012   NA 135 11/28/2014 1401   K 4.0 11/01/2019 1012   K 4.7 11/28/2014 1401   CL 101 11/01/2019 1012   CL 99 (L) 11/28/2014 1401   CO2 20 (L) 11/01/2019 1012   CO2 28 11/28/2014 1401   GLUCOSE 230 (H) 11/01/2019 1012   GLUCOSE 215 (H) 11/28/2014 1401   BUN 22 11/01/2019 1012   BUN 16 11/28/2014 1401   CREATININE 1.17 (H) 11/01/2019 1012   CREATININE 0.96 11/28/2014 1401   CALCIUM 8.9 11/01/2019 1012   CALCIUM 9.9 11/28/2014 1401   PROT 7.0 11/01/2019 1012   PROT 7.6 11/28/2014 1401   ALBUMIN 3.7 11/01/2019 1012   ALBUMIN 4.5 11/28/2014 1401   AST 27 11/01/2019 1012   AST 26 11/28/2014 1401   ALT 17 11/01/2019 1012   ALT 28 11/28/2014 1401   ALKPHOS 68 11/01/2019 1012   ALKPHOS 78 11/28/2014 1401   BILITOT 0.5 11/01/2019 1012   BILITOT 0.5 11/28/2014 1401   GFRNONAA 49 (L) 11/01/2019 1012   GFRNONAA >60 11/28/2014 1401   GFRAA 57 (L) 11/01/2019 1012    GFRAA >60 11/28/2014 1401    No results found for: SPEP, UPEP  Lab Results  Component Value Date   WBC 2.9 (L) 11/01/2019   NEUTROABS 1.2 (L) 11/01/2019   HGB 10.7 (L) 11/01/2019   HCT 32.7 (L) 11/01/2019   MCV 95.9 11/01/2019   PLT 270 11/01/2019      Chemistry      Component Value Date/Time   NA 131 (L) 11/01/2019 1012   NA 135  11/28/2014 1401   K 4.0 11/01/2019 1012   K 4.7 11/28/2014 1401   CL 101 11/01/2019 1012   CL 99 (L) 11/28/2014 1401   CO2 20 (L) 11/01/2019 1012   CO2 28 11/28/2014 1401   BUN 22 11/01/2019 1012   BUN 16 11/28/2014 1401   CREATININE 1.17 (H) 11/01/2019 1012   CREATININE 0.96 11/28/2014 1401      Component Value Date/Time   CALCIUM 8.9 11/01/2019 1012   CALCIUM 9.9 11/28/2014 1401   ALKPHOS 68 11/01/2019 1012   ALKPHOS 78 11/28/2014 1401   AST 27 11/01/2019 1012   AST 26 11/28/2014 1401   ALT 17 11/01/2019 1012   ALT 28 11/28/2014 1401   BILITOT 0.5 11/01/2019 1012   BILITOT 0.5 11/28/2014 1401       RADIOGRAPHIC STUDIES: I have personally reviewed the radiological images as listed and agreed with the findings in the report. No results found.   ASSESSMENT & PLAN:  Carcinoma of lower-outer quadrant of right breast in female, estrogen receptor positive ([place])  # Recurrent breast cancer-ER positive PR negative ? HER-2/neu-currently on Faslodex plus abema.  FEB 2021- CT A/P-NEG. STABLE-no evidence of any progressive metastatic disease; groundglass opacities improving [recent Covid infection].        # Continue Faslodex plus Abema 100 BID; Labs today-white count low/ANC 1.2; monitor for now.   # History of right upper extremity DVT:on Eliquis-stable.  However patient concerned about price of the medication/need for continued anticoagulation.  Will get ultrasound next week  # CKD- stage III- monitor closely; monitor blood glucose- stable.   #Encouraged the patient to get her Covid vaccination; discussed the pros and cons.  Since  patient did not get any immunoglobulins/monoclonal antibody-I think she is still a candidate for vaccine at this time   #DISPOSITION: # dopplers of Right UE in 1 week # Faslodex/today  # Follow up in 1 month MD/ labs (CBC/CMP/Ca 27-29) Faslodex;-Dr.B   # I reviewed the blood work- with the patient in detail; also reviewed the imaging independently [as summarized above]; and with the patient in detail.    Orders Placed This Encounter  Procedures  . US Venous Img Upper Uni Right(DVT)    Standing Status:   Future    Standing Expiration Date:   12/31/2020    Order Specific Question:   Reason for Exam (SYMPTOM  OR DIAGNOSIS REQUIRED)    Answer:   hx of DVT of right Upper extrimity    Order Specific Question:   Preferred imaging location?    Answer:   [facility]   All questions were answered. The patient knows to call the clinic with any problems, questions or concerns.      [name], MD 11/02/2019 7:22 AM

## 2019-11-02 LAB — CANCER ANTIGEN 27.29: CA 27.29: 34.8 U/mL (ref 0.0–38.6)

## 2019-11-05 NOTE — Telephone Encounter (Signed)
Called to check the status of application.  Kimberly [name], rep, pulled application from their fax queue and started to make Kimberly [name] a profile in their system.  She reviewed all the information that was faxed in and just needed a section on page completed and faxed in.  I have completed that and faxed back to [employer].  I will call back tomorrow for the rep to pull the fax that I sent and attach to patients profile.  [place] Patient [place] Phone [phone] Fax [phone] 11/05/2019 3:34 PM

## 2019-11-07 DIAGNOSIS — Z6832 Body mass index (BMI) 32.0-32.9, adult: Secondary | ICD-10-CM | POA: Diagnosis not present

## 2019-11-07 DIAGNOSIS — Z853 Personal history of malignant neoplasm of breast: Secondary | ICD-10-CM | POA: Diagnosis not present

## 2019-11-07 DIAGNOSIS — N762 Acute vulvitis: Secondary | ICD-10-CM | POA: Diagnosis not present

## 2019-11-07 DIAGNOSIS — Z01419 Encounter for gynecological examination (general) (routine) without abnormal findings: Secondary | ICD-10-CM | POA: Diagnosis not present

## 2019-11-07 NOTE — Telephone Encounter (Signed)
Oral Oncology Patient Advocate Encounter  Received notification from [facility] Patient Assistance program that patient has been successfully enrolled into their program to receive Verzenio from the manufacturer at $0 out of pocket until 08/29/2020.    I called and spoke with patient.  She knows we will have to re-apply.   [employer] contracted pharmacy will reach out to the patient in 3-5 days to set up shipment of medication.  Patient knows to call the office with questions or concerns.   Oral Oncology Clinic will continue to follow.  [place] Patient [place] Phone [phone] Fax [phone] 11/07/2019 11:42 AM

## 2019-11-08 ENCOUNTER — Other Ambulatory Visit: Payer: Self-pay

## 2019-11-08 ENCOUNTER — Ambulatory Visit
Admission: RE | Admit: 2019-11-08 | Discharge: 2019-11-08 | Disposition: A | Discharge status: Home / Self Care | Payer: PPO | Source: Ambulatory Visit | Attending: Internal Medicine | Admitting: Internal Medicine

## 2019-11-08 DIAGNOSIS — M79601 Pain in right arm: Secondary | ICD-10-CM

## 2019-11-08 DIAGNOSIS — Z170 Estrogen receptor positive status [ER+]: Secondary | ICD-10-CM | POA: Insufficient documentation

## 2019-11-08 DIAGNOSIS — M7989 Other specified soft tissue disorders: Secondary | ICD-10-CM | POA: Diagnosis not present

## 2019-11-08 DIAGNOSIS — C50511 Malignant neoplasm of lower-outer quadrant of right female breast: Secondary | ICD-10-CM | POA: Diagnosis not present

## 2019-11-08 DIAGNOSIS — R600 Localized edema: Secondary | ICD-10-CM | POA: Diagnosis not present

## 2019-11-11 ENCOUNTER — Telehealth: Payer: Self-pay | Admitting: Internal Medicine

## 2019-11-11 NOTE — Telephone Encounter (Signed)
On 3/11-spoke to patient regarding negative results of the ultrasound; okay to discontinue Eliquis/financial reason.

## 2019-11-22 NOTE — Progress Notes (Signed)
Pharmacist Chemotherapy Monitoring - Follow Up Assessment    I verify that I have reviewed each item in the below checklist:  . Regimen for the patient is scheduled for the appropriate day and plan matches scheduled date. Marland Kitchen Appropriate non-routine labs are ordered dependent on drug ordered. . If applicable, additional medications reviewed and ordered per protocol based on lifetime cumulative doses and/or treatment regimen.   Plan for follow-up and/or issues identified: No . I-vent associated with next due treatment: No . MD and/or nursing notified: No  Grady Mohabir K 11/22/2019 8:38 AM

## 2019-11-28 ENCOUNTER — Other Ambulatory Visit: Payer: Self-pay

## 2019-11-29 ENCOUNTER — Inpatient Hospital Stay (HOSPITAL_BASED_OUTPATIENT_CLINIC_OR_DEPARTMENT_OTHER): Payer: PPO | Admitting: Internal Medicine

## 2019-11-29 ENCOUNTER — Inpatient Hospital Stay: Payer: PPO

## 2019-11-29 ENCOUNTER — Inpatient Hospital Stay: Payer: PPO | Attending: Internal Medicine

## 2019-11-29 ENCOUNTER — Encounter: Payer: Self-pay | Admitting: Internal Medicine

## 2019-11-29 DIAGNOSIS — C50511 Malignant neoplasm of lower-outer quadrant of right female breast: Secondary | ICD-10-CM

## 2019-11-29 DIAGNOSIS — Z833 Family history of diabetes mellitus: Secondary | ICD-10-CM | POA: Insufficient documentation

## 2019-11-29 DIAGNOSIS — I10 Essential (primary) hypertension: Secondary | ICD-10-CM | POA: Insufficient documentation

## 2019-11-29 DIAGNOSIS — Z9013 Acquired absence of bilateral breasts and nipples: Secondary | ICD-10-CM | POA: Insufficient documentation

## 2019-11-29 DIAGNOSIS — Z79899 Other long term (current) drug therapy: Secondary | ICD-10-CM | POA: Diagnosis not present

## 2019-11-29 DIAGNOSIS — E7800 Pure hypercholesterolemia, unspecified: Secondary | ICD-10-CM | POA: Insufficient documentation

## 2019-11-29 DIAGNOSIS — Z86718 Personal history of other venous thrombosis and embolism: Secondary | ICD-10-CM | POA: Insufficient documentation

## 2019-11-29 DIAGNOSIS — Z170 Estrogen receptor positive status [ER+]: Secondary | ICD-10-CM

## 2019-11-29 DIAGNOSIS — Z9221 Personal history of antineoplastic chemotherapy: Secondary | ICD-10-CM | POA: Diagnosis not present

## 2019-11-29 DIAGNOSIS — N183 Chronic kidney disease, stage 3 unspecified: Secondary | ICD-10-CM | POA: Insufficient documentation

## 2019-11-29 DIAGNOSIS — C7800 Secondary malignant neoplasm of unspecified lung: Secondary | ICD-10-CM | POA: Diagnosis not present

## 2019-11-29 DIAGNOSIS — E119 Type 2 diabetes mellitus without complications: Secondary | ICD-10-CM | POA: Diagnosis not present

## 2019-11-29 DIAGNOSIS — Z803 Family history of malignant neoplasm of breast: Secondary | ICD-10-CM | POA: Insufficient documentation

## 2019-11-29 DIAGNOSIS — Z7901 Long term (current) use of anticoagulants: Secondary | ICD-10-CM | POA: Diagnosis not present

## 2019-11-29 DIAGNOSIS — Z794 Long term (current) use of insulin: Secondary | ICD-10-CM | POA: Insufficient documentation

## 2019-11-29 DIAGNOSIS — Z923 Personal history of irradiation: Secondary | ICD-10-CM | POA: Insufficient documentation

## 2019-11-29 LAB — CBC WITH DIFFERENTIAL/PLATELET
Abs Immature Granulocytes: 0.02 10*3/uL (ref 0.00–0.07)
Basophils Absolute: 0.1 10*3/uL (ref 0.0–0.1)
Basophils Relative: 1 %
Eosinophils Absolute: 0.1 10*3/uL (ref 0.0–0.5)
Eosinophils Relative: 2 %
HCT: 35.5 % — ABNORMAL LOW (ref 36.0–46.0)
Hemoglobin: 12.1 g/dL (ref 12.0–15.0)
Immature Granulocytes: 1 %
Lymphocytes Relative: 44 %
Lymphs Abs: 1.7 10*3/uL (ref 0.7–4.0)
MCH: 32.4 pg (ref 26.0–34.0)
MCHC: 34.1 g/dL (ref 30.0–36.0)
MCV: 94.9 fL (ref 80.0–100.0)
Monocytes Absolute: 0.2 10*3/uL (ref 0.1–1.0)
Monocytes Relative: 5 %
Neutro Abs: 1.9 10*3/uL (ref 1.7–7.7)
Neutrophils Relative %: 47 %
Platelets: 228 10*3/uL (ref 150–400)
RBC: 3.74 MIL/uL — ABNORMAL LOW (ref 3.87–5.11)
RDW: 14.5 % (ref 11.5–15.5)
WBC: 3.9 10*3/uL — ABNORMAL LOW (ref 4.0–10.5)
nRBC: 0 % (ref 0.0–0.2)

## 2019-11-29 LAB — COMPREHENSIVE METABOLIC PANEL
ALT: 20 U/L (ref 0–44)
AST: 27 U/L (ref 15–41)
Albumin: 3.7 g/dL (ref 3.5–5.0)
Alkaline Phosphatase: 82 U/L (ref 38–126)
Anion gap: 10 (ref 5–15)
BUN: 26 mg/dL — ABNORMAL HIGH (ref 8–23)
CO2: 22 mmol/L (ref 22–32)
Calcium: 9.6 mg/dL (ref 8.9–10.3)
Chloride: 102 mmol/L (ref 98–111)
Creatinine, Ser: 1.07 mg/dL — ABNORMAL HIGH (ref 0.44–1.00)
GFR calc Af Amer: >60 mL/min (ref >60)
GFR calc non Af Amer: 54 mL/min — ABNORMAL LOW (ref >60)
Glucose, Bld: 185 mg/dL — ABNORMAL HIGH (ref 70–99)
Potassium: 3.8 mmol/L (ref 3.5–5.1)
Sodium: 134 mmol/L — ABNORMAL LOW (ref 135–145)
Total Bilirubin: 0.5 mg/dL (ref 0.3–1.2)
Total Protein: 7.6 g/dL (ref 6.5–8.1)

## 2019-11-29 MED ORDER — FULVESTRANT 250 MG/5ML IM SOLN
500.0000 mg | Freq: Once | INTRAMUSCULAR | Status: AC
  Administered 2019-11-29: 11:00:00 500 mg via INTRAMUSCULAR
  Filled 2019-11-29: qty 10

## 2019-11-29 NOTE — Progress Notes (Signed)
.[place] OFFICE PROGRESS NOTE  Patient Care Team: Patient, No Pcp Per as PCP - General ([place]) Neldon Mc, MD (General Surgery) Everlene Farrier, MD (Obstetrics and Gynecology) Noreene Filbert, MD Forest Gleason, MD (Inactive) (Unknown Physician Specialty)  Cancer Staging Carcinoma of lower-outer quadrant of right breast in female, estrogen receptor positive [facility]) Staging form: Breast, AJCC 7th Edition - Pathologic: ypT1c,ypN2a, MX - Signed by Haywood Lasso, MD on 03/10/2012    Oncology History Overview Note  # DEC 2012- RIGHT BREAST CA T2 N2 M0 tumor from biopsy.  Estrogen receptor positive, Progesterone receptor positive.  Current receptor negative by FISH 2. Neoadjuvant chemotherapy started in December of 28 with Cytoxan Adriamycin 3. Started on Taxol weekly chemotherapy. 4. Patient finished 12  cycles of Taxol chemotherapy in May of 2013.     5. Status post right modified radical mastectomy [Dr.Bowers; [place]] June of 2013, ypT1c  yp N2  MO. started also on Lerazole    7. Radiation therapy to the right breast (September of 2013).  Lymph node was positive for HER-2 receptor gene amplification of 2.22.  Will proceed with Herceptin treatment starting in September of 2013.   8.Patient has finished Herceptin (maintenance therapy) in August of 2014 8. Start patient on letrozole from November, 2013. 9. Patient started on Herceptin in September 2013.    10Patient finished 12 months of Herceptin therapy on August, 2014  # 6. Status post left side prophylactic mastectomy.  #Late MAY 2018-RECURRENCE BREAST CA- ER positive/PR negative; ?? HER-2/neu- [biopsy- proven-mediastinal lymph node; in suff for her 2 testing].  [elevated Tumor marker- CT/PET- uptake in Right Mediastinal LN; Sternum [June 2018 EBUS- Dr.Kasa]   # March 10 2017- faslodex + Abema; OCT 5th CT-PR of mediastinal LN [consent]  #August 2020-left chest wall nodule biopsy benign  #August  2019- DVT-right upper extremity; Eliquis; stop end of March 2021 [repeat ultrasound negative/patient preference] --------------------------------------------    DIAGNOSIS: [ BREAST CANCER- ER/PR/HER2 NEU POS  STAGE:  4   ;GOALS: PALLIATIVE  CURRENT/MOST RECENT THERAPY - ABEMA + FASLODEX    Carcinoma of lower-outer quadrant of right breast in female, estrogen receptor positive ([place])   INTERVAL HISTORY:  Kimberly [name] 64 y.o.  female pleasant patient above history of metastatic ER PR positive HER-2/neu positive breast cancer currently on abema+ Faslodex is here for follow-up.  In the interim patient had ultrasound of the right upper extremity.  Negative for blood clot.  Patient was taken off Eliquis.  Otherwise denies any new shortness of breath or cough.  She denies any new diarrhea or abdominal pain.  No skin rash no headaches.   Review of Systems  Constitutional: Positive for malaise/fatigue. Negative for chills, diaphoresis, fever and weight loss.  HENT: Negative for nosebleeds and sore throat.   Eyes: Negative for double vision.  Respiratory: Negative for cough, hemoptysis, sputum production, shortness of breath and wheezing.   Cardiovascular: Negative for chest pain, palpitations, orthopnea and leg swelling.  Gastrointestinal: Positive for diarrhea. Negative for abdominal pain, blood in stool, constipation, heartburn, melena, nausea and vomiting.  Genitourinary: Negative for dysuria, frequency and urgency.  Musculoskeletal: Positive for back pain and joint pain.  Neurological: Negative for dizziness, tingling, focal weakness, weakness and headaches.  Endo/Heme/Allergies: Does not bruise/bleed easily.  Psychiatric/Behavioral: Negative for depression. The patient is not nervous/anxious and does not have insomnia.       PAST MEDICAL HISTORY :  Past Medical History:  Diagnosis Date  . Arthritis   .  Cancer of lower-outer quadrant of female breast ([place]) 08/06/2011   RIGHT,  chemo and bilateral mastectomies.  . High cholesterol   . History of kidney stones   . Hx of bilateral breast implants   . Hypertension   . Lung metastases ([place]) 20118   chemo pills and faslidex shots.  Marland Kitchen PONV (postoperative nausea and vomiting)   . Type II diabetes mellitus ([facility])    fasting 140-150  . Vertigo    LAST WEEK  . Vertigo 01/2017    PAST SURGICAL HISTORY :   Past Surgical History:  Procedure Laterality Date  . BREAST BIOPSY  07/2011, 2020   right  . BREAST RECONSTRUCTION  03/07/2012   Procedure: BREAST RECONSTRUCTION;  Surgeon: Crissie Reese, MD;  Location: [place];  Service: Plastics;  Laterality: Left;  BREAST RECONSTRUCTION WITH PLACEMENT OF TISSUE EXPANDER TO LEFT BREAST  . BREAST RECONSTRUCTION  02/06/2013  . CESAREAN SECTION  [id number]  . ENDOBRONCHIAL ULTRASOUND N/A 02/24/2017   Procedure: ENDOBRONCHIAL ULTRASOUND;  Surgeon: [name], MD;  Location: [facility];  Service: Cardiopulmonary;  Laterality: N/A;  . LATISSIMUS FLAP TO BREAST Right 02/06/2013   Procedure: LATISSIMUS FLAP TO RIGHT BREAST WITH IMPLANT;  Surgeon: Crissie Reese, MD;  Location: [place];  Service: Plastics;  Laterality: Right;  . MASTECTOMY  03/07/12   modified right; total left  . MODIFIED MASTECTOMY  03/07/2012   Procedure: MODIFIED MASTECTOMY;  Surgeon: Haywood Lasso, MD;  Location: [place];  Service: General;  Laterality: Right;  . PORTACATH PLACEMENT  07/2011  . SCAR REVISION  03/30/2012   Procedure: SCAR REVISION;  Surgeon: Haywood Lasso, MD;  Location: [place];  Service: General;  Laterality: Right;  CLOSURE OF MASTECTOMY INCISION  . WISDOM TOOTH EXTRACTION      FAMILY HISTORY :   Family History  Problem Relation Age of Onset  . Breast cancer Mother   . Diabetes Father     SOCIAL HISTORY:   Social History   Tobacco Use  . Smoking status: Never Smoker  . Smokeless tobacco: Never Used  Substance Use Topics  . Alcohol use: No  . Drug use: No    ALLERGIES:   is allergic to sulfa antibiotics.  MEDICATIONS:  Current Outpatient Medications  Medication Sig Dispense Refill  . atorvastatin (LIPITOR) 20 MG tablet Take 1 tablet (20 mg total) by mouth daily. 90 tablet 3  . betamethasone valerate (VALISONE) 0.1 % cream Apply 0.1 application topically as needed.    . Calcium Carbonate-Vitamin D (CALCIUM 600 + D PO) Take 1 tablet by mouth 2 (two) times daily.    . fulvestrant (FASLODEX) 250 MG/5ML injection Inject into the muscle every 30 (thirty) days. One injection each buttock over 1-2 minutes. Warm prior to use.    Marland Kitchen glipiZIDE (GLUCOTROL XL) 10 MG 24 hr tablet Take 1 tablet (10 mg total) by mouth 2 (two) times daily. 180 tablet 3  . insulin aspart (NOVOLOG FLEXPEN) 100 UNIT/ML FlexPen Inject 4 Units into the skin 3 (three) times daily with meals. 15 mL 0  . Insulin Detemir (LEVEMIR) 100 UNIT/ML Pen Inject 20 Units into the skin at bedtime. 15 mL 0  . levothyroxine (SYNTHROID) 25 MCG tablet TAKE 1 TABLET (25 MCG TOTAL) BY MOUTH DAILY BEFORE BREAKFAST. 90 tablet 1  . loperamide (IMODIUM A-D) 2 MG tablet Take 2 mg by mouth 4 (four) times daily as needed for diarrhea or loose stools.    . ondansetron (ZOFRAN) 8 MG tablet TAKE  1 TABLET (8 MG TOTAL) BY MOUTH EVERY 8 (EIGHT) HOURS AS NEEDED FOR NAUSEA OR VOMITING. 20 tablet 3  . promethazine (PHENERGAN) 25 MG tablet Take 1 tablet (25 mg total) by mouth every 6 (six) hours as needed for nausea or vomiting. 30 tablet 0  . VERZENIO 100 MG tablet TAKE 1 TABLET BY MOUTH TWICE DAILY 56 tablet 3  . apixaban (ELIQUIS) 5 MG TABS tablet Take 1 tablet (5 mg total) by mouth 2 (two) times daily. 60 tablet 6   No current facility-administered medications for this visit.    PHYSICAL EXAMINATION: ECOG PERFORMANCE STATUS: 1 - Symptomatic but completely ambulatory  BP 130/78 (BP Location: Left Arm, Patient Position: Sitting, Cuff Size: Normal)   Pulse (!) 108   Temp (!) 95.4 F (35.2 C) (Tympanic)   Wt 173 lb (78.5 kg)    BMI 31.64 kg/m   Filed Weights   11/29/19 1005  Weight: 173 lb (78.5 kg)    Physical Exam  Constitutional: She is oriented to person, place, and time and well-developed, well-nourished, and in no distress.  She is alone.   HENT:  Head: Normocephalic and atraumatic.  Mouth/Throat: Oropharynx is clear and moist. No oropharyngeal exudate.  Eyes: Pupils are equal, round, and reactive to light.  Cardiovascular: Normal rate and regular rhythm.  Pulmonary/Chest: No respiratory distress. She has no wheezes.  Abdominal: Soft. Bowel sounds are normal. She exhibits no distension and no mass. There is no abdominal tenderness. There is no rebound and no guarding.  Musculoskeletal:        General: No tenderness or edema. Normal range of motion.     Cervical back: Normal range of motion and neck supple.     Comments: Approximately 1 cm hard nodule felt in the anterior chest wall/left parasternal.  Neurological: She is alert and oriented to person, place, and time.  Skin: Skin is warm.  Psychiatric: Affect normal.    LABORATORY DATA:  I have reviewed the data as listed    Component Value Date/Time   NA 134 (L) 11/29/2019 0957   NA 135 11/28/2014 1401   K 3.8 11/29/2019 0957   K 4.7 11/28/2014 1401   CL 102 11/29/2019 0957   CL 99 (L) 11/28/2014 1401   CO2 22 11/29/2019 0957   CO2 28 11/28/2014 1401   GLUCOSE 185 (H) 11/29/2019 0957   GLUCOSE 215 (H) 11/28/2014 1401   BUN 26 (H) 11/29/2019 0957   BUN 16 11/28/2014 1401   CREATININE 1.07 (H) 11/29/2019 0957   CREATININE 0.96 11/28/2014 1401   CALCIUM 9.6 11/29/2019 0957   CALCIUM 9.9 11/28/2014 1401   PROT 7.6 11/29/2019 0957   PROT 7.6 11/28/2014 1401   ALBUMIN 3.7 11/29/2019 0957   ALBUMIN 4.5 11/28/2014 1401   AST 27 11/29/2019 0957   AST 26 11/28/2014 1401   ALT 20 11/29/2019 0957   ALT 28 11/28/2014 1401   ALKPHOS 82 11/29/2019 0957   ALKPHOS 78 11/28/2014 1401   BILITOT 0.5 11/29/2019 0957   BILITOT 0.5 11/28/2014 1401    GFRNONAA 54 (L) 11/29/2019 0957   GFRNONAA >60 11/28/2014 1401   GFRAA >60 11/29/2019 0957   GFRAA >60 11/28/2014 1401    No results found for: SPEP, UPEP  Lab Results  Component Value Date   WBC 3.9 (L) 11/29/2019   NEUTROABS 1.9 11/29/2019   HGB 12.1 11/29/2019   HCT 35.5 (L) 11/29/2019   MCV 94.9 11/29/2019   PLT 228 11/29/2019  Chemistry      Component Value Date/Time   NA 134 (L) 11/29/2019 0957   NA 135 11/28/2014 1401   K 3.8 11/29/2019 0957   K 4.7 11/28/2014 1401   CL 102 11/29/2019 0957   CL 99 (L) 11/28/2014 1401   CO2 22 11/29/2019 0957   CO2 28 11/28/2014 1401   BUN 26 (H) 11/29/2019 0957   BUN 16 11/28/2014 1401   CREATININE 1.07 (H) 11/29/2019 0957   CREATININE 0.96 11/28/2014 1401      Component Value Date/Time   CALCIUM 9.6 11/29/2019 0957   CALCIUM 9.9 11/28/2014 1401   ALKPHOS 82 11/29/2019 0957   ALKPHOS 78 11/28/2014 1401   AST 27 11/29/2019 0957   AST 26 11/28/2014 1401   ALT 20 11/29/2019 0957   ALT 28 11/28/2014 1401   BILITOT 0.5 11/29/2019 0957   BILITOT 0.5 11/28/2014 1401       RADIOGRAPHIC STUDIES: I have personally reviewed the radiological images as listed and agreed with the findings in the report. No results found.   ASSESSMENT & PLAN:  Carcinoma of lower-outer quadrant of right breast in female, estrogen receptor positive ([place])  # Recurrent breast cancer-ER positive PR negative ? HER-2/neu-currently on Faslodex plus abema.  FEB 2021- CT A/P-NEG. STABLE. No evidence of any progressive metastatic disease; groundglass opacities improving [recent Covid infection].        # Continue Faslodex plus Abema 100 BID; Labs today-white count 1.9 monitor for now.   # History of right upper extremity DVT: Stopped [Mid-March 2021]  # CKD- stage III- monitor closely; monitor blood glucose-STABLE.    #DISPOSITION: # Faslodex/today  # Follow up in 1 month MD/ labs (CBC/CMP/Ca 27-29) Faslodex;-Dr.B     Orders Placed This  Encounter  Procedures  . CBC with Differential    Standing Status:   Future    Standing Expiration Date:   11/28/2020  . Comprehensive metabolic panel    Standing Status:   Future    Standing Expiration Date:   11/28/2020  . Cancer antigen 27.29    Standing Status:   Future    Standing Expiration Date:   11/28/2020   All questions were answered. The patient knows to call the clinic with any problems, questions or concerns.      [name], MD 11/30/2019 12:17 PM

## 2019-11-29 NOTE — Assessment & Plan Note (Addendum)
#  Recurrent breast cancer-ER positive PR negative ? HER-2/neu-currently on Faslodex plus abema.  FEB 2021- CT A/P-NEG. STABLE. No evidence of any progressive metastatic disease; groundglass opacities improving [recent Covid infection].        # Continue Faslodex plus Abema 100 BID; Labs today-white count 1.9 monitor for now.   # History of right upper extremity DVT: Stopped [Mid-March 2021]  # CKD- stage III- monitor closely; monitor blood glucose-STABLE.    #DISPOSITION: # Faslodex/today  # Follow up in 1 month MD/ labs (CBC/CMP/Ca 27-29) Faslodex;-Dr.B

## 2019-11-30 ENCOUNTER — Encounter: Payer: Self-pay | Admitting: Internal Medicine

## 2019-11-30 LAB — CANCER ANTIGEN 27.29: CA 27.29: 29.3 U/mL (ref 0.0–38.6)

## 2020-01-03 ENCOUNTER — Inpatient Hospital Stay: Payer: PPO | Admitting: Internal Medicine

## 2020-01-03 ENCOUNTER — Inpatient Hospital Stay: Payer: PPO | Attending: Internal Medicine

## 2020-01-03 ENCOUNTER — Inpatient Hospital Stay: Payer: PPO

## 2020-01-03 ENCOUNTER — Other Ambulatory Visit: Payer: Self-pay | Admitting: *Deleted

## 2020-01-03 ENCOUNTER — Other Ambulatory Visit: Payer: Self-pay

## 2020-01-03 ENCOUNTER — Telehealth: Payer: Self-pay | Admitting: Internal Medicine

## 2020-01-03 VITALS — BP 120/73 | HR 94 | Temp 96.9°F | Resp 18 | Wt 172.4 lb

## 2020-01-03 DIAGNOSIS — E119 Type 2 diabetes mellitus without complications: Secondary | ICD-10-CM | POA: Diagnosis not present

## 2020-01-03 DIAGNOSIS — N183 Chronic kidney disease, stage 3 unspecified: Secondary | ICD-10-CM | POA: Insufficient documentation

## 2020-01-03 DIAGNOSIS — Z170 Estrogen receptor positive status [ER+]: Secondary | ICD-10-CM

## 2020-01-03 DIAGNOSIS — Z5111 Encounter for antineoplastic chemotherapy: Secondary | ICD-10-CM | POA: Diagnosis not present

## 2020-01-03 DIAGNOSIS — C50511 Malignant neoplasm of lower-outer quadrant of right female breast: Secondary | ICD-10-CM | POA: Diagnosis not present

## 2020-01-03 DIAGNOSIS — R300 Dysuria: Secondary | ICD-10-CM

## 2020-01-03 DIAGNOSIS — Z9013 Acquired absence of bilateral breasts and nipples: Secondary | ICD-10-CM | POA: Insufficient documentation

## 2020-01-03 DIAGNOSIS — Z803 Family history of malignant neoplasm of breast: Secondary | ICD-10-CM | POA: Insufficient documentation

## 2020-01-03 DIAGNOSIS — C7800 Secondary malignant neoplasm of unspecified lung: Secondary | ICD-10-CM | POA: Diagnosis not present

## 2020-01-03 DIAGNOSIS — N390 Urinary tract infection, site not specified: Secondary | ICD-10-CM | POA: Diagnosis not present

## 2020-01-03 DIAGNOSIS — Z79899 Other long term (current) drug therapy: Secondary | ICD-10-CM | POA: Diagnosis not present

## 2020-01-03 DIAGNOSIS — I10 Essential (primary) hypertension: Secondary | ICD-10-CM | POA: Diagnosis not present

## 2020-01-03 DIAGNOSIS — Z833 Family history of diabetes mellitus: Secondary | ICD-10-CM | POA: Diagnosis not present

## 2020-01-03 DIAGNOSIS — T451X5A Adverse effect of antineoplastic and immunosuppressive drugs, initial encounter: Secondary | ICD-10-CM

## 2020-01-03 DIAGNOSIS — Z794 Long term (current) use of insulin: Secondary | ICD-10-CM | POA: Diagnosis not present

## 2020-01-03 LAB — URINALYSIS, COMPLETE (UACMP) WITH MICROSCOPIC
Bilirubin Urine: NEGATIVE
Glucose, UA: NEGATIVE mg/dL
Ketones, ur: NEGATIVE mg/dL
Nitrite: NEGATIVE
Protein, ur: >=300 mg/dL — AB
RBC / HPF: >50 RBC/hpf — ABNORMAL HIGH (ref 0–5)
Specific Gravity, Urine: 1.023 (ref 1.005–1.030)
WBC, UA: >50 WBC/hpf — ABNORMAL HIGH (ref 0–5)
pH: 5 (ref 5.0–8.0)

## 2020-01-03 LAB — COMPREHENSIVE METABOLIC PANEL
ALT: 18 U/L (ref 0–44)
AST: 24 U/L (ref 15–41)
Albumin: 3.7 g/dL (ref 3.5–5.0)
Alkaline Phosphatase: 73 U/L (ref 38–126)
Anion gap: 11 (ref 5–15)
BUN: 15 mg/dL (ref 8–23)
CO2: 22 mmol/L (ref 22–32)
Calcium: 8.8 mg/dL — ABNORMAL LOW (ref 8.9–10.3)
Chloride: 102 mmol/L (ref 98–111)
Creatinine, Ser: 1.02 mg/dL — ABNORMAL HIGH (ref 0.44–1.00)
GFR calc Af Amer: >60 mL/min (ref >60)
GFR calc non Af Amer: 58 mL/min — ABNORMAL LOW (ref >60)
Glucose, Bld: 143 mg/dL — ABNORMAL HIGH (ref 70–99)
Potassium: 3.4 mmol/L — ABNORMAL LOW (ref 3.5–5.1)
Sodium: 135 mmol/L (ref 135–145)
Total Bilirubin: 0.6 mg/dL (ref 0.3–1.2)
Total Protein: 7.2 g/dL (ref 6.5–8.1)

## 2020-01-03 LAB — CBC WITH DIFFERENTIAL/PLATELET
Abs Immature Granulocytes: 0.02 10*3/uL (ref 0.00–0.07)
Basophils Absolute: 0.1 10*3/uL (ref 0.0–0.1)
Basophils Relative: 2 %
Eosinophils Absolute: 0.1 10*3/uL (ref 0.0–0.5)
Eosinophils Relative: 2 %
HCT: 33.9 % — ABNORMAL LOW (ref 36.0–46.0)
Hemoglobin: 11.8 g/dL — ABNORMAL LOW (ref 12.0–15.0)
Immature Granulocytes: 0 %
Lymphocytes Relative: 48 %
Lymphs Abs: 2.2 10*3/uL (ref 0.7–4.0)
MCH: 32.9 pg (ref 26.0–34.0)
MCHC: 34.8 g/dL (ref 30.0–36.0)
MCV: 94.4 fL (ref 80.0–100.0)
Monocytes Absolute: 0.2 10*3/uL (ref 0.1–1.0)
Monocytes Relative: 5 %
Neutro Abs: 2 10*3/uL (ref 1.7–7.7)
Neutrophils Relative %: 43 %
Platelets: 252 10*3/uL (ref 150–400)
RBC: 3.59 MIL/uL — ABNORMAL LOW (ref 3.87–5.11)
RDW: 13 % (ref 11.5–15.5)
WBC: 4.5 10*3/uL (ref 4.0–10.5)
nRBC: 0 % (ref 0.0–0.2)

## 2020-01-03 MED ORDER — FULVESTRANT 250 MG/5ML IM SOLN
500.0000 mg | Freq: Once | INTRAMUSCULAR | Status: AC
  Administered 2020-01-03: 500 mg via INTRAMUSCULAR
  Filled 2020-01-03: qty 10

## 2020-01-03 MED ORDER — ONDANSETRON HCL 8 MG PO TABS: 8.0000 mg | ORAL_TABLET | Freq: Three times a day (TID) | ORAL | 3 refills | Status: DC | PRN

## 2020-01-03 MED ORDER — CIPROFLOXACIN HCL 500 MG PO TABS
500.0000 mg | ORAL_TABLET | Freq: Two times a day (BID) | ORAL | 0 refills | Status: DC
Start: 2020-01-03 — End: 2020-01-31

## 2020-01-03 NOTE — Telephone Encounter (Signed)
Reviewed the UA; positive for infection. ' Called in a script for ciprofloxacin. Pt to be informed.  GB

## 2020-01-03 NOTE — Assessment & Plan Note (Addendum)
#  Recurrent breast cancer-ER positive PR negative ? HER-2/neu-currently on Faslodex plus abema.  FEB 2021- CT A/P-NEG. STABLE. No evidence of any progressive metastatic disease; stable.  # Continue Faslodex; hold Abema 100 BID for 1 week-given UTI; Labs today-white count- WBC today.   #Clinically UTI-recommend UA and culture.  Hold abemaciclib for 1 week  # Bil ingiunal fold rash- fungal dermatitis-recommend Desenex; also minimizing moisture buildup/drying  # CKD- stage III- monitor closely; monitor blood glucose-stable   #DISPOSITION: # Faslodex/today  # Follow up in 1 month MD/ labs (CBC/CMP/Ca 27-29) Faslodex;-Dr.B   Addendum: UA positive for infection.  Culture pending.  Start patient on ciprofloxacin.  Patient informed.   

## 2020-01-03 NOTE — Patient Instructions (Addendum)
#   use desinex for the rash # keep the area dry.  # HOLD verenzio for 1 week; until the UTI gets better

## 2020-01-03 NOTE — Progress Notes (Signed)
.[place] OFFICE PROGRESS NOTE  Patient Care Team: Patient, No Pcp Per as PCP - General ([place]) Neldon Mc, MD (General Surgery) Everlene Farrier, MD (Obstetrics and Gynecology) Noreene Filbert, MD Forest Gleason, MD (Inactive) (Unknown Physician Specialty)  Cancer Staging Carcinoma of lower-outer quadrant of right breast in female, estrogen receptor positive [facility]) Staging form: Breast, AJCC 7th Edition - Pathologic: ypT1c,ypN2a, MX - Signed by Haywood Lasso, MD on 03/10/2012    Oncology History Overview Note  # DEC 2012- RIGHT BREAST CA T2 N2 M0 tumor from biopsy.  Estrogen receptor positive, Progesterone receptor positive.  Current receptor negative by FISH 2. Neoadjuvant chemotherapy started in December of 28 with Cytoxan Adriamycin 3. Started on Taxol weekly chemotherapy. 4. Patient finished 12  cycles of Taxol chemotherapy in May of 2013.     5. Status post right modified radical mastectomy [Dr.Bowers; [place]] June of 2013, ypT1c  yp N2  MO. started also on Lerazole    7. Radiation therapy to the right breast (September of 2013).  Lymph node was positive for HER-2 receptor gene amplification of 2.22.  Will proceed with Herceptin treatment starting in September of 2013.   8.Patient has finished Herceptin (maintenance therapy) in August of 2014 8. Start patient on letrozole from November, 2013. 9. Patient started on Herceptin in September 2013.    10Patient finished 12 months of Herceptin therapy on August, 2014  # 6. Status post left side prophylactic mastectomy.  #Late MAY 2018-RECURRENCE BREAST CA- ER positive/PR negative; ?? HER-2/neu- [biopsy- proven-mediastinal lymph node; in suff for her 2 testing].  [elevated Tumor marker- CT/PET- uptake in Right Mediastinal LN; Sternum [June 2018 EBUS- Dr.Kasa]   # March 10 2017- faslodex + Abema; OCT 5th CT-PR of mediastinal LN [consent]  #August 2020-left chest wall nodule biopsy benign  #August  2019- DVT-right upper extremity; Eliquis; stop end of March 2021 [repeat ultrasound negative/patient preference] --------------------------------------------    DIAGNOSIS: [ BREAST CANCER- ER/PR/HER2 NEU POS  STAGE:  4   ;GOALS: PALLIATIVE  CURRENT/MOST RECENT THERAPY - ABEMA + FASLODEX    Carcinoma of lower-outer quadrant of right breast in female, estrogen receptor positive ([place])   INTERVAL HISTORY:  Kimberly [name] 64 y.o.  female pleasant patient above history of metastatic ER PR positive HER-2/neu positive breast cancer currently on abema+ Faslodex is here for follow-up.  Today patient feels poorly.  Complains dysuria increased frequency of urination no hematuria.  Complains of body aches hip pain.  No chills no rigors.  Review of Systems  Constitutional: Positive for malaise/fatigue. Negative for chills, diaphoresis, fever and weight loss.  HENT: Negative for nosebleeds and sore throat.   Eyes: Negative for double vision.  Respiratory: Negative for cough, hemoptysis, sputum production, shortness of breath and wheezing.   Cardiovascular: Negative for chest pain, palpitations, orthopnea and leg swelling.  Gastrointestinal: Positive for diarrhea. Negative for abdominal pain, blood in stool, constipation, heartburn, melena, nausea and vomiting.  Genitourinary: Positive for dysuria, frequency and urgency.  Musculoskeletal: Positive for back pain and joint pain.  Neurological: Negative for dizziness, tingling, focal weakness, weakness and headaches.  Endo/Heme/Allergies: Does not bruise/bleed easily.  Psychiatric/Behavioral: Negative for depression. The patient is not nervous/anxious and does not have insomnia.       PAST MEDICAL HISTORY :  Past Medical History:  Diagnosis Date  . Arthritis   . Cancer of lower-outer quadrant of female breast ([place]) 08/06/2011   RIGHT, chemo and bilateral mastectomies.  . High cholesterol   .  History of kidney stones   . Hx of bilateral  breast implants   . Hypertension   . Lung metastases ([place]) 20118   chemo pills and faslidex shots.  Marland Kitchen PONV (postoperative nausea and vomiting)   . Type II diabetes mellitus ([facility])    fasting 140-150  . Vertigo    LAST WEEK  . Vertigo 01/2017    PAST SURGICAL HISTORY :   Past Surgical History:  Procedure Laterality Date  . BREAST BIOPSY  07/2011, 2020   right  . BREAST RECONSTRUCTION  03/07/2012   Procedure: BREAST RECONSTRUCTION;  Surgeon: Crissie Reese, MD;  Location: [place];  Service: Plastics;  Laterality: Left;  BREAST RECONSTRUCTION WITH PLACEMENT OF TISSUE EXPANDER TO LEFT BREAST  . BREAST RECONSTRUCTION  02/06/2013  . CESAREAN SECTION  [id number]  . ENDOBRONCHIAL ULTRASOUND N/A 02/24/2017   Procedure: ENDOBRONCHIAL ULTRASOUND;  Surgeon: [name], MD;  Location: [facility];  Service: Cardiopulmonary;  Laterality: N/A;  . LATISSIMUS FLAP TO BREAST Right 02/06/2013   Procedure: LATISSIMUS FLAP TO RIGHT BREAST WITH IMPLANT;  Surgeon: Crissie Reese, MD;  Location: [place];  Service: Plastics;  Laterality: Right;  . MASTECTOMY  03/07/12   modified right; total left  . MODIFIED MASTECTOMY  03/07/2012   Procedure: MODIFIED MASTECTOMY;  Surgeon: Haywood Lasso, MD;  Location: [place];  Service: General;  Laterality: Right;  . PORTACATH PLACEMENT  07/2011  . SCAR REVISION  03/30/2012   Procedure: SCAR REVISION;  Surgeon: Haywood Lasso, MD;  Location: [place];  Service: General;  Laterality: Right;  CLOSURE OF MASTECTOMY INCISION  . WISDOM TOOTH EXTRACTION      FAMILY HISTORY :   Family History  Problem Relation Age of Onset  . Breast cancer Mother   . Diabetes Father     SOCIAL HISTORY:   Social History   Tobacco Use  . Smoking status: Never Smoker  . Smokeless tobacco: Never Used  Substance Use Topics  . Alcohol use: No  . Drug use: No    ALLERGIES:  is allergic to sulfa antibiotics.  MEDICATIONS:  Current Outpatient Medications  Medication Sig  Dispense Refill  . atorvastatin (LIPITOR) 20 MG tablet Take 1 tablet (20 mg total) by mouth daily. 90 tablet 3  . betamethasone valerate (VALISONE) 0.1 % cream Apply 0.1 application topically as needed.    . Calcium Carbonate-Vitamin D (CALCIUM 600 + D PO) Take 1 tablet by mouth 2 (two) times daily.    . fulvestrant (FASLODEX) 250 MG/5ML injection Inject into the muscle every 30 (thirty) days. One injection each buttock over 1-2 minutes. Warm prior to use.    Marland Kitchen glipiZIDE (GLUCOTROL XL) 10 MG 24 hr tablet Take 1 tablet (10 mg total) by mouth 2 (two) times daily. 180 tablet 3  . insulin aspart (NOVOLOG FLEXPEN) 100 UNIT/ML FlexPen Inject 4 Units into the skin 3 (three) times daily with meals. 15 mL 0  . Insulin Detemir (LEVEMIR) 100 UNIT/ML Pen Inject 20 Units into the skin at bedtime. 15 mL 0  . levothyroxine (SYNTHROID) 25 MCG tablet TAKE 1 TABLET (25 MCG TOTAL) BY MOUTH DAILY BEFORE BREAKFAST. 90 tablet 1  . lisinopril (ZESTRIL) 10 MG tablet Take 10 mg by mouth daily.    . ondansetron (ZOFRAN-ODT) 4 MG disintegrating tablet ondansetron 4 mg disintegrating tablet  TAKE 1 TAKE 1 TABLET BY MOUTH EVERY 8 HOURS AS NEEDED FOR NAUSEA OR VOMITING    . promethazine (PHENERGAN) 25 MG tablet Take 1 tablet (  25 mg total) by mouth every 6 (six) hours as needed for nausea or vomiting. 30 tablet 0  . VERZENIO 100 MG tablet TAKE 1 TABLET BY MOUTH TWICE DAILY 56 tablet 3  . ciprofloxacin (CIPRO) 500 MG tablet Take 1 tablet (500 mg total) by mouth 2 (two) times daily. 14 tablet 0  . loperamide (IMODIUM A-D) 2 MG tablet Take 2 mg by mouth 4 (four) times daily as needed for diarrhea or loose stools.    . mupirocin ointment (BACTROBAN) 2 % mupirocin 2 % topical ointment  APPLY 1 APPLICATION TOPICALLY 3 (THREE) TIMES DAILY.    Marland Kitchen ondansetron (ZOFRAN) 8 MG tablet Take 1 tablet (8 mg total) by mouth every 8 (eight) hours as needed for nausea or vomiting. 20 tablet 3   No current facility-administered medications for this  visit.    PHYSICAL EXAMINATION: ECOG PERFORMANCE STATUS: 1 - Symptomatic but completely ambulatory  BP 120/73 (BP Location: Left Arm, Patient Position: Sitting)   Pulse 94   Temp (!) 96.9 F (36.1 C) (Tympanic)   Resp 18   Wt 172 lb 6.4 oz (78.2 kg)   SpO2 99%   BMI 31.53 kg/m   Filed Weights   01/03/20 1030  Weight: 172 lb 6.4 oz (78.2 kg)    Physical Exam  Constitutional: She is oriented to person, place, and time and well-developed, well-nourished, and in no distress.  She is alone.   HENT:  Head: Normocephalic and atraumatic.  Mouth/Throat: Oropharynx is clear and moist. No oropharyngeal exudate.  Eyes: Pupils are equal, round, and reactive to light.  Cardiovascular: Normal rate and regular rhythm.  Pulmonary/Chest: No respiratory distress. She has no wheezes.  Abdominal: Soft. Bowel sounds are normal. She exhibits no distension and no mass. There is no abdominal tenderness. There is no rebound and no guarding.  Musculoskeletal:        General: No tenderness or edema. Normal range of motion.     Cervical back: Normal range of motion and neck supple.     Comments: Approximately 1 cm hard nodule felt in the anterior chest wall/left parasternal.  Neurological: She is alert and oriented to person, place, and time.  Skin: Skin is warm.  Psychiatric: Affect normal.    LABORATORY DATA:  I have reviewed the data as listed    Component Value Date/Time   NA 135 01/03/2020 0959   NA 135 11/28/2014 1401   K 3.4 (L) 01/03/2020 0959   K 4.7 11/28/2014 1401   CL 102 01/03/2020 0959   CL 99 (L) 11/28/2014 1401   CO2 22 01/03/2020 0959   CO2 28 11/28/2014 1401   GLUCOSE 143 (H) 01/03/2020 0959   GLUCOSE 215 (H) 11/28/2014 1401   BUN 15 01/03/2020 0959   BUN 16 11/28/2014 1401   CREATININE 1.02 (H) 01/03/2020 0959   CREATININE 0.96 11/28/2014 1401   CALCIUM 8.8 (L) 01/03/2020 0959   CALCIUM 9.9 11/28/2014 1401   PROT 7.2 01/03/2020 0959   PROT 7.6 11/28/2014 1401    ALBUMIN 3.7 01/03/2020 0959   ALBUMIN 4.5 11/28/2014 1401   AST 24 01/03/2020 0959   AST 26 11/28/2014 1401   ALT 18 01/03/2020 0959   ALT 28 11/28/2014 1401   ALKPHOS 73 01/03/2020 0959   ALKPHOS 78 11/28/2014 1401   BILITOT 0.6 01/03/2020 0959   BILITOT 0.5 11/28/2014 1401   GFRNONAA 58 (L) 01/03/2020 0959   GFRNONAA >60 11/28/2014 1401   GFRAA >60 01/03/2020 0959   GFRAA >60  11/28/2014 1401    No results found for: SPEP, UPEP  Lab Results  Component Value Date   WBC 4.5 01/03/2020   NEUTROABS 2.0 01/03/2020   HGB 11.8 (L) 01/03/2020   HCT 33.9 (L) 01/03/2020   MCV 94.4 01/03/2020   PLT 252 01/03/2020      Chemistry      Component Value Date/Time   NA 135 01/03/2020 0959   NA 135 11/28/2014 1401   K 3.4 (L) 01/03/2020 0959   K 4.7 11/28/2014 1401   CL 102 01/03/2020 0959   CL 99 (L) 11/28/2014 1401   CO2 22 01/03/2020 0959   CO2 28 11/28/2014 1401   BUN 15 01/03/2020 0959   BUN 16 11/28/2014 1401   CREATININE 1.02 (H) 01/03/2020 0959   CREATININE 0.96 11/28/2014 1401      Component Value Date/Time   CALCIUM 8.8 (L) 01/03/2020 0959   CALCIUM 9.9 11/28/2014 1401   ALKPHOS 73 01/03/2020 0959   ALKPHOS 78 11/28/2014 1401   AST 24 01/03/2020 0959   AST 26 11/28/2014 1401   ALT 18 01/03/2020 0959   ALT 28 11/28/2014 1401   BILITOT 0.6 01/03/2020 0959   BILITOT 0.5 11/28/2014 1401       RADIOGRAPHIC STUDIES: I have personally reviewed the radiological images as listed and agreed with the findings in the report. No results found.   ASSESSMENT & PLAN:  Carcinoma of lower-outer quadrant of right breast in female, estrogen receptor positive ([place])  # Recurrent breast cancer-ER positive PR negative ? HER-2/neu-currently on Faslodex plus abema.  FEB 2021- CT A/P-NEG. STABLE. No evidence of any progressive metastatic disease; stable.  # Continue Faslodex; hold Abema 100 BID for 1 week-given UTI; Labs today-white count- WBC today.   #Clinically UTI-recommend UA  and culture.  Hold abemaciclib for 1 week  # Bil ingiunal fold rash- fungal dermatitis-recommend Desenex; also minimizing moisture buildup/drying  # CKD- stage III- monitor closely; monitor blood glucose-stable   #DISPOSITION: # Faslodex/today  # Follow up in 1 month MD/ labs (CBC/CMP/Ca 27-29) Faslodex;-Dr.B   Addendum: UA positive for infection.  Culture pending.  Start patient on ciprofloxacin.  Patient informed.    Orders Placed This Encounter  Procedures  . Urine culture    Standing Status:   Future    Number of Occurrences:   1    Standing Expiration Date:   01/02/2021  . Urinalysis, Complete w Microscopic    Standing Status:   Future    Number of Occurrences:   1    Standing Expiration Date:   01/02/2021   All questions were answered. The patient knows to call the clinic with any problems, questions or concerns.      [name], MD 01/03/2020 6:14 PM

## 2020-01-03 NOTE — Progress Notes (Signed)
Pt in for follow up states been in pain since Tuesday with UTI or kidney stone pain and decreased urine output.  Pt states has burning when voids as well.  Pt also has a rash in abdomen folds of skin and request medication.  Pt needs refill on zofran 4mg  ODT.

## 2020-01-03 NOTE — Telephone Encounter (Signed)
Patient made aware of cipro script and +UTI results. She also asked if a rf could be submitted for zofran. I spoke with Dr. Rogue Bussing. Md agreeable to zofran RF.

## 2020-01-04 ENCOUNTER — Other Ambulatory Visit: Payer: Self-pay | Admitting: *Deleted

## 2020-01-04 LAB — CANCER ANTIGEN 27.29: CA 27.29: 30.5 U/mL (ref 0.0–38.6)

## 2020-01-04 NOTE — Telephone Encounter (Signed)
Patient prefers ODT zofran. Please consider approving this script.

## 2020-01-05 LAB — URINE CULTURE: Culture: >=100000 — AB

## 2020-01-07 MED ORDER — ONDANSETRON 4 MG PO TBDP: 4.0000 mg | ORAL_TABLET | Freq: Three times a day (TID) | ORAL | 3 refills | Status: DC | PRN

## 2020-01-18 ENCOUNTER — Other Ambulatory Visit: Payer: Self-pay | Admitting: *Deleted

## 2020-01-18 ENCOUNTER — Encounter: Payer: Self-pay | Admitting: Internal Medicine

## 2020-01-18 MED ORDER — FLUCONAZOLE 150 MG PO TABS: 150.0000 mg | ORAL_TABLET | Freq: Every day | ORAL | 0 refills | Status: DC

## 2020-01-18 NOTE — Telephone Encounter (Signed)
How about we try some fluconozole? I will order it now.   Faythe Casa, NP 01/18/2020 11:38 AM

## 2020-01-31 ENCOUNTER — Inpatient Hospital Stay: Payer: PPO | Attending: Internal Medicine

## 2020-01-31 ENCOUNTER — Inpatient Hospital Stay: Payer: PPO | Admitting: Internal Medicine

## 2020-01-31 ENCOUNTER — Encounter: Payer: Self-pay | Admitting: Internal Medicine

## 2020-01-31 ENCOUNTER — Other Ambulatory Visit: Payer: Self-pay

## 2020-01-31 ENCOUNTER — Inpatient Hospital Stay: Payer: PPO

## 2020-01-31 DIAGNOSIS — Z9221 Personal history of antineoplastic chemotherapy: Secondary | ICD-10-CM | POA: Diagnosis not present

## 2020-01-31 DIAGNOSIS — Z923 Personal history of irradiation: Secondary | ICD-10-CM | POA: Insufficient documentation

## 2020-01-31 DIAGNOSIS — C7800 Secondary malignant neoplasm of unspecified lung: Secondary | ICD-10-CM | POA: Diagnosis not present

## 2020-01-31 DIAGNOSIS — Z79899 Other long term (current) drug therapy: Secondary | ICD-10-CM | POA: Insufficient documentation

## 2020-01-31 DIAGNOSIS — E119 Type 2 diabetes mellitus without complications: Secondary | ICD-10-CM | POA: Insufficient documentation

## 2020-01-31 DIAGNOSIS — Z5111 Encounter for antineoplastic chemotherapy: Secondary | ICD-10-CM | POA: Insufficient documentation

## 2020-01-31 DIAGNOSIS — Z87442 Personal history of urinary calculi: Secondary | ICD-10-CM | POA: Diagnosis not present

## 2020-01-31 DIAGNOSIS — C50511 Malignant neoplasm of lower-outer quadrant of right female breast: Secondary | ICD-10-CM | POA: Insufficient documentation

## 2020-01-31 DIAGNOSIS — Z833 Family history of diabetes mellitus: Secondary | ICD-10-CM | POA: Insufficient documentation

## 2020-01-31 DIAGNOSIS — N390 Urinary tract infection, site not specified: Secondary | ICD-10-CM | POA: Insufficient documentation

## 2020-01-31 DIAGNOSIS — N183 Chronic kidney disease, stage 3 unspecified: Secondary | ICD-10-CM | POA: Insufficient documentation

## 2020-01-31 DIAGNOSIS — Z170 Estrogen receptor positive status [ER+]: Secondary | ICD-10-CM | POA: Diagnosis not present

## 2020-01-31 DIAGNOSIS — Z803 Family history of malignant neoplasm of breast: Secondary | ICD-10-CM | POA: Diagnosis not present

## 2020-01-31 DIAGNOSIS — Z794 Long term (current) use of insulin: Secondary | ICD-10-CM | POA: Diagnosis not present

## 2020-01-31 DIAGNOSIS — Z9013 Acquired absence of bilateral breasts and nipples: Secondary | ICD-10-CM | POA: Diagnosis not present

## 2020-01-31 DIAGNOSIS — E7800 Pure hypercholesterolemia, unspecified: Secondary | ICD-10-CM | POA: Insufficient documentation

## 2020-01-31 DIAGNOSIS — B369 Superficial mycosis, unspecified: Secondary | ICD-10-CM | POA: Insufficient documentation

## 2020-01-31 DIAGNOSIS — I10 Essential (primary) hypertension: Secondary | ICD-10-CM | POA: Insufficient documentation

## 2020-01-31 LAB — COMPREHENSIVE METABOLIC PANEL
ALT: 24 U/L (ref 0–44)
AST: 33 U/L (ref 15–41)
Albumin: 3.7 g/dL (ref 3.5–5.0)
Alkaline Phosphatase: 76 U/L (ref 38–126)
Anion gap: 12 (ref 5–15)
BUN: 21 mg/dL (ref 8–23)
CO2: 24 mmol/L (ref 22–32)
Calcium: 9.4 mg/dL (ref 8.9–10.3)
Chloride: 98 mmol/L (ref 98–111)
Creatinine, Ser: 1.1 mg/dL — ABNORMAL HIGH (ref 0.44–1.00)
GFR calc Af Amer: >60 mL/min (ref >60)
GFR calc non Af Amer: 53 mL/min — ABNORMAL LOW (ref >60)
Glucose, Bld: 187 mg/dL — ABNORMAL HIGH (ref 70–99)
Potassium: 4 mmol/L (ref 3.5–5.1)
Sodium: 134 mmol/L — ABNORMAL LOW (ref 135–145)
Total Bilirubin: 0.6 mg/dL (ref 0.3–1.2)
Total Protein: 7.6 g/dL (ref 6.5–8.1)

## 2020-01-31 LAB — CBC WITH DIFFERENTIAL/PLATELET
Abs Immature Granulocytes: 0.02 10*3/uL (ref 0.00–0.07)
Basophils Absolute: 0.1 10*3/uL (ref 0.0–0.1)
Basophils Relative: 2 %
Eosinophils Absolute: 0.1 10*3/uL (ref 0.0–0.5)
Eosinophils Relative: 2 %
HCT: 35.4 % — ABNORMAL LOW (ref 36.0–46.0)
Hemoglobin: 12.3 g/dL (ref 12.0–15.0)
Immature Granulocytes: 1 %
Lymphocytes Relative: 51 %
Lymphs Abs: 1.8 10*3/uL (ref 0.7–4.0)
MCH: 32.7 pg (ref 26.0–34.0)
MCHC: 34.7 g/dL (ref 30.0–36.0)
MCV: 94.1 fL (ref 80.0–100.0)
Monocytes Absolute: 0.2 10*3/uL (ref 0.1–1.0)
Monocytes Relative: 7 %
Neutro Abs: 1.3 10*3/uL — ABNORMAL LOW (ref 1.7–7.7)
Neutrophils Relative %: 37 %
Platelets: 258 10*3/uL (ref 150–400)
RBC: 3.76 MIL/uL — ABNORMAL LOW (ref 3.87–5.11)
RDW: 13.5 % (ref 11.5–15.5)
WBC: 3.5 10*3/uL — ABNORMAL LOW (ref 4.0–10.5)
nRBC: 0 % (ref 0.0–0.2)

## 2020-01-31 MED ORDER — FULVESTRANT 250 MG/5ML IM SOLN
500.0000 mg | Freq: Once | INTRAMUSCULAR | Status: AC
  Administered 2020-01-31: 500 mg via INTRAMUSCULAR
  Filled 2020-01-31: qty 10

## 2020-01-31 MED ORDER — TERBINAFINE HCL 250 MG PO TABS: 250.0000 mg | ORAL_TABLET | Freq: Every day | ORAL | 0 refills | Status: DC

## 2020-01-31 NOTE — Assessment & Plan Note (Addendum)
#  Recurrent breast cancer-ER positive PR negative ? HER-2/neu-currently on Faslodex plus abema.  FEB 2021- CT A/P-NEG. STABLE. No evidence of any progressive metastatic disease; stable  # Continue Faslodex; hold Abema 100 BID; Labs today-white count- WBC today. Will repeat scans in August 2021.   #Clinically UTI- s/p cipro- resolved.   # Bil ingiunal fold rash- fungal dermatitis- worse; not improved on topical; recommend terbinafine.  Prescription sent.  No interactions noted.   CKD- stage III- monitor closely; stable   #DISPOSITION: # Faslodex/today  # Follow up in July 8th  month MD/ labs (CBC/CMP/Ca 27-29) Faslodex;-Dr.B

## 2020-01-31 NOTE — Progress Notes (Signed)
.[place] OFFICE PROGRESS NOTE  Patient Care Team: Patient, No Pcp Per as PCP - General ([place]) Neldon Mc, MD (General Surgery) Everlene Farrier, MD (Obstetrics and Gynecology) Noreene Filbert, MD Forest Gleason, MD (Inactive) (Unknown Physician Specialty)  Cancer Staging Carcinoma of lower-outer quadrant of right breast in female, estrogen receptor positive [facility]) Staging form: Breast, AJCC 7th Edition - Pathologic: ypT1c,ypN2a, MX - Signed by Haywood Lasso, MD on 03/10/2012    Oncology History Overview Note  # DEC 2012- RIGHT BREAST CA T2 N2 M0 tumor from biopsy.  Estrogen receptor positive, Progesterone receptor positive.  Current receptor negative by FISH 2. Neoadjuvant chemotherapy started in December of 28 with Cytoxan Adriamycin 3. Started on Taxol weekly chemotherapy. 4. Patient finished 12  cycles of Taxol chemotherapy in May of 2013.     5. Status post right modified radical mastectomy [Dr.Bowers; [place]] June of 2013, ypT1c  yp N2  MO. started also on Lerazole    7. Radiation therapy to the right breast (September of 2013).  Lymph node was positive for HER-2 receptor gene amplification of 2.22.  Will proceed with Herceptin treatment starting in September of 2013.   8.Patient has finished Herceptin (maintenance therapy) in August of 2014 8. Start patient on letrozole from November, 2013. 9. Patient started on Herceptin in September 2013.    10Patient finished 12 months of Herceptin therapy on August, 2014  # 6. Status post left side prophylactic mastectomy.  #Late MAY 2018-RECURRENCE BREAST CA- ER positive/PR negative; ?? HER-2/neu- [biopsy- proven-mediastinal lymph node; in suff for her 2 testing].  [elevated Tumor marker- CT/PET- uptake in Right Mediastinal LN; Sternum [June 2018 EBUS- Dr.Kasa]   # March 10 2017- faslodex + Abema; OCT 5th CT-PR of mediastinal LN [consent]  #August 2020-left chest wall nodule biopsy benign  #August  2019- DVT-right upper extremity; Eliquis; stop end of March 2021 [repeat ultrasound negative/patient preference] --------------------------------------------    DIAGNOSIS: [ BREAST CANCER- ER/PR/HER2 NEU POS  STAGE:  4   ;GOALS: PALLIATIVE  CURRENT/MOST RECENT THERAPY - ABEMA + FASLODEX    Carcinoma of lower-outer quadrant of right breast in female, estrogen receptor positive ([place])   INTERVAL HISTORY:  Kimberly [name] 64 y.o.  female pleasant patient above history of metastatic ER PR positive HER-2/neu positive breast cancer currently on abema+ Faslodex is here for follow-up.  Patient was treated with ciprofloxacin for UTI.  Symptoms improved.  Patient complains of worsening/rather not improving rash of her groins.  Itchy.  Painful.  She previously tried Desenex without any significant improvement.  Otherwise no fever no chills no nausea no vomiting.  Review of Systems  Constitutional: Positive for malaise/fatigue. Negative for chills, diaphoresis, fever and weight loss.  HENT: Negative for nosebleeds and sore throat.   Eyes: Negative for double vision.  Respiratory: Negative for cough, hemoptysis, sputum production, shortness of breath and wheezing.   Cardiovascular: Negative for chest pain, palpitations, orthopnea and leg swelling.  Gastrointestinal: Positive for diarrhea. Negative for abdominal pain, blood in stool, constipation, heartburn, melena, nausea and vomiting.  Musculoskeletal: Positive for back pain and joint pain.  Skin: Positive for rash.  Neurological: Negative for dizziness, tingling, focal weakness, weakness and headaches.  Endo/Heme/Allergies: Does not bruise/bleed easily.  Psychiatric/Behavioral: Negative for depression. The patient is not nervous/anxious and does not have insomnia.       PAST MEDICAL HISTORY :  Past Medical History:  Diagnosis Date  . Arthritis   . Cancer of lower-outer quadrant of female  breast ([place]) 08/06/2011   RIGHT, chemo and  bilateral mastectomies.  . High cholesterol   . History of kidney stones   . Hx of bilateral breast implants   . Hypertension   . Lung metastases ([place]) 20118   chemo pills and faslidex shots.  Marland Kitchen PONV (postoperative nausea and vomiting)   . Type II diabetes mellitus ([facility])    fasting 140-150  . Vertigo    LAST WEEK  . Vertigo 01/2017    PAST SURGICAL HISTORY :   Past Surgical History:  Procedure Laterality Date  . BREAST BIOPSY  07/2011, 2020   right  . BREAST RECONSTRUCTION  03/07/2012   Procedure: BREAST RECONSTRUCTION;  Surgeon: Crissie Reese, MD;  Location: [place];  Service: Plastics;  Laterality: Left;  BREAST RECONSTRUCTION WITH PLACEMENT OF TISSUE EXPANDER TO LEFT BREAST  . BREAST RECONSTRUCTION  02/06/2013  . CESAREAN SECTION  [id number]  . ENDOBRONCHIAL ULTRASOUND N/A 02/24/2017   Procedure: ENDOBRONCHIAL ULTRASOUND;  Surgeon: [name], MD;  Location: [facility];  Service: Cardiopulmonary;  Laterality: N/A;  . LATISSIMUS FLAP TO BREAST Right 02/06/2013   Procedure: LATISSIMUS FLAP TO RIGHT BREAST WITH IMPLANT;  Surgeon: Crissie Reese, MD;  Location: [place];  Service: Plastics;  Laterality: Right;  . MASTECTOMY  03/07/12   modified right; total left  . MODIFIED MASTECTOMY  03/07/2012   Procedure: MODIFIED MASTECTOMY;  Surgeon: Haywood Lasso, MD;  Location: [place];  Service: General;  Laterality: Right;  . PORTACATH PLACEMENT  07/2011  . SCAR REVISION  03/30/2012   Procedure: SCAR REVISION;  Surgeon: Haywood Lasso, MD;  Location: [place];  Service: General;  Laterality: Right;  CLOSURE OF MASTECTOMY INCISION  . WISDOM TOOTH EXTRACTION      FAMILY HISTORY :   Family History  Problem Relation Age of Onset  . Breast cancer Mother   . Diabetes Father     SOCIAL HISTORY:   Social History   Tobacco Use  . Smoking status: Never Smoker  . Smokeless tobacco: Never Used  Substance Use Topics  . Alcohol use: No  . Drug use: No    ALLERGIES:  is allergic  to sulfa antibiotics.  MEDICATIONS:  Current Outpatient Medications  Medication Sig Dispense Refill  . atorvastatin (LIPITOR) 20 MG tablet Take 1 tablet (20 mg total) by mouth daily. 90 tablet 3  . betamethasone valerate (VALISONE) 0.1 % cream Apply 0.1 application topically as needed.    . Calcium Carbonate-Vitamin D (CALCIUM 600 + D PO) Take 1 tablet by mouth 2 (two) times daily.    . fulvestrant (FASLODEX) 250 MG/5ML injection Inject into the muscle every 30 (thirty) days. One injection each buttock over 1-2 minutes. Warm prior to use.    Marland Kitchen glipiZIDE (GLUCOTROL XL) 10 MG 24 hr tablet Take 1 tablet (10 mg total) by mouth 2 (two) times daily. 180 tablet 3  . insulin aspart (NOVOLOG FLEXPEN) 100 UNIT/ML FlexPen Inject 4 Units into the skin 3 (three) times daily with meals. 15 mL 0  . Insulin Detemir (LEVEMIR) 100 UNIT/ML Pen Inject 20 Units into the skin at bedtime. 15 mL 0  . levothyroxine (SYNTHROID) 25 MCG tablet TAKE 1 TABLET (25 MCG TOTAL) BY MOUTH DAILY BEFORE BREAKFAST. 90 tablet 1  . lisinopril (ZESTRIL) 10 MG tablet Take 10 mg by mouth daily.    . mupirocin ointment (BACTROBAN) 2 % mupirocin 2 % topical ointment  APPLY 1 APPLICATION TOPICALLY 3 (THREE) TIMES DAILY.    Marland Kitchen  VERZENIO 100 MG tablet TAKE 1 TABLET BY MOUTH TWICE DAILY 56 tablet 3  . fluconazole (DIFLUCAN) 150 MG tablet Take 1 tablet (150 mg total) by mouth daily. Take another tablet in 72 hours if yeast infection not resolved. (Patient not taking: Reported on 01/31/2020) 2 tablet 0  . loperamide (IMODIUM A-D) 2 MG tablet Take 2 mg by mouth 4 (four) times daily as needed for diarrhea or loose stools.    . ondansetron (ZOFRAN) 8 MG tablet Take 1 tablet (8 mg total) by mouth every 8 (eight) hours as needed for nausea or vomiting. (Patient not taking: Reported on 01/31/2020) 20 tablet 3  . ondansetron (ZOFRAN-ODT) 4 MG disintegrating tablet Take 1 tablet (4 mg total) by mouth every 8 (eight) hours as needed for nausea or vomiting.  (Patient not taking: Reported on 01/31/2020) 20 tablet 3  . promethazine (PHENERGAN) 25 MG tablet Take 1 tablet (25 mg total) by mouth every 6 (six) hours as needed for nausea or vomiting. (Patient not taking: Reported on 01/31/2020) 30 tablet 0  . terbinafine (LAMISIL) 250 MG tablet Take 1 tablet (250 mg total) by mouth daily. 14 tablet 0   No current facility-administered medications for this visit.   Facility-Administered Medications Ordered in Other Visits  Medication Dose Route Frequency Provider Last Rate Last Admin  . fulvestrant (FASLODEX) injection 500 mg  500 mg Intramuscular Once [name], MD        PHYSICAL EXAMINATION: ECOG PERFORMANCE STATUS: 1 - Symptomatic but completely ambulatory  BP 134/86   Pulse (!) 105   Temp 97.6 F (36.4 C) (Tympanic)   Resp 20   Ht 5' 2" (1.575 m)   Wt 173 lb (78.5 kg)   BMI 31.64 kg/m   Filed Weights   01/31/20 1042  Weight: 173 lb (78.5 kg)    Physical Exam  Constitutional: She is oriented to person, place, and time and well-developed, well-nourished, and in no distress.  She is alone.   HENT:  Head: Normocephalic and atraumatic.  Mouth/Throat: Oropharynx is clear and moist. No oropharyngeal exudate.  Eyes: Pupils are equal, round, and reactive to light.  Cardiovascular: Normal rate and regular rhythm.  Pulmonary/Chest: Effort normal and breath sounds normal. No respiratory distress. She has no wheezes.  Abdominal: Soft. Bowel sounds are normal. She exhibits no distension and no mass. There is no abdominal tenderness. There is no rebound and no guarding.  Musculoskeletal:        General: No tenderness or edema. Normal range of motion.     Cervical back: Normal range of motion and neck supple.     Comments: Approximately 1 cm hard nodule felt in the anterior chest wall/left parasternal.  Neurological: She is alert and oriented to person, place, and time.  Skin: Skin is warm.  Maculopapular rash in the left inguinal  region/fold.  Psychiatric: Affect normal.    LABORATORY DATA:  I have reviewed the data as listed    Component Value Date/Time   NA 134 (L) 01/31/2020 0957   NA 135 11/28/2014 1401   K 4.0 01/31/2020 0957   K 4.7 11/28/2014 1401   CL 98 01/31/2020 0957   CL 99 (L) 11/28/2014 1401   CO2 24 01/31/2020 0957   CO2 28 11/28/2014 1401   GLUCOSE 187 (H) 01/31/2020 0957   GLUCOSE 215 (H) 11/28/2014 1401   BUN 21 01/31/2020 0957   BUN 16 11/28/2014 1401   CREATININE 1.10 (H) 01/31/2020 0957   CREATININE 0.96 11/28/2014  1401   CALCIUM 9.4 01/31/2020 0957   CALCIUM 9.9 11/28/2014 1401   PROT 7.6 01/31/2020 0957   PROT 7.6 11/28/2014 1401   ALBUMIN 3.7 01/31/2020 0957   ALBUMIN 4.5 11/28/2014 1401   AST 33 01/31/2020 0957   AST 26 11/28/2014 1401   ALT 24 01/31/2020 0957   ALT 28 11/28/2014 1401   ALKPHOS 76 01/31/2020 0957   ALKPHOS 78 11/28/2014 1401   BILITOT 0.6 01/31/2020 0957   BILITOT 0.5 11/28/2014 1401   GFRNONAA 53 (L) 01/31/2020 0957   GFRNONAA >60 11/28/2014 1401   GFRAA >60 01/31/2020 0957   GFRAA >60 11/28/2014 1401    No results found for: SPEP, UPEP  Lab Results  Component Value Date   WBC 3.5 (L) 01/31/2020   NEUTROABS 1.3 (L) 01/31/2020   HGB 12.3 01/31/2020   HCT 35.4 (L) 01/31/2020   MCV 94.1 01/31/2020   PLT 258 01/31/2020      Chemistry      Component Value Date/Time   NA 134 (L) 01/31/2020 0957   NA 135 11/28/2014 1401   K 4.0 01/31/2020 0957   K 4.7 11/28/2014 1401   CL 98 01/31/2020 0957   CL 99 (L) 11/28/2014 1401   CO2 24 01/31/2020 0957   CO2 28 11/28/2014 1401   BUN 21 01/31/2020 0957   BUN 16 11/28/2014 1401   CREATININE 1.10 (H) 01/31/2020 0957   CREATININE 0.96 11/28/2014 1401      Component Value Date/Time   CALCIUM 9.4 01/31/2020 0957   CALCIUM 9.9 11/28/2014 1401   ALKPHOS 76 01/31/2020 0957   ALKPHOS 78 11/28/2014 1401   AST 33 01/31/2020 0957   AST 26 11/28/2014 1401   ALT 24 01/31/2020 0957   ALT 28 11/28/2014  1401   BILITOT 0.6 01/31/2020 0957   BILITOT 0.5 11/28/2014 1401       RADIOGRAPHIC STUDIES: I have personally reviewed the radiological images as listed and agreed with the findings in the report. No results found.   ASSESSMENT & PLAN:  Carcinoma of lower-outer quadrant of right breast in female, estrogen receptor positive ([place])  # Recurrent breast cancer-ER positive PR negative ? HER-2/neu-currently on Faslodex plus abema.  FEB 2021- CT A/P-NEG. STABLE. No evidence of any progressive metastatic disease; stable  # Continue Faslodex; hold Abema 100 BID; Labs today-white count- WBC today. Will repeat scans in August 2021.   #Clinically UTI- s/p cipro- resolved.   # Bil ingiunal fold rash- fungal dermatitis- worse; not improved on topical; recommend terbinafine.  Prescription sent.  No interactions noted.   CKD- stage III- monitor closely; stable   #DISPOSITION: # Faslodex/today  # Follow up in July 8th  month MD/ labs (CBC/CMP/Ca 27-29) Faslodex;-Dr.B      No orders of the defined types were placed in this encounter.  All questions were answered. The patient knows to call the clinic with any problems, questions or concerns.      [name], MD 01/31/2020 11:02 AM

## 2020-02-01 LAB — CANCER ANTIGEN 27.29: CA 27.29: 36 U/mL (ref 0.0–38.6)

## 2020-02-21 ENCOUNTER — Other Ambulatory Visit: Payer: Self-pay | Admitting: Internal Medicine

## 2020-02-21 DIAGNOSIS — Z170 Estrogen receptor positive status [ER+]: Secondary | ICD-10-CM

## 2020-02-21 NOTE — Telephone Encounter (Signed)
CBC with Differential Order: [id number] Status:  Final result Visible to patient:  Yes (seen) Next appt:  03/06/2020 at 09:00 AM in Oncology ([facility]) Dx:  Carcinoma of lower-outer quadrant of ...  0 Result Notes  Ref Range & Units 3 wk ago 1 mo ago 2 mo ago  WBC 4.0 - 10.5 K/uL 3.5Low  4.5  3.9Low   RBC 3.87 - 5.11 MIL/uL 3.76Low  3.59Low  3.74Low   Hemoglobin 12.0 - 15.0 g/dL 12.3  11.8Low  12.1   HCT 36 - 46 % 35.4Low  33.9Low  35.5Low   MCV 80.0 - 100.0 fL 94.1  94.4  94.9   MCH 26.0 - 34.0 pg 32.7  32.9  32.4   MCHC 30.0 - 36.0 g/dL 34.7  34.8  34.1   RDW 11.5 - 15.5 % 13.5  13.0  14.5   Platelets 150 - 400 K/uL 258  252  228   nRBC 0.0 - 0.2 % 0.0  0.0  0.0   Neutrophils Relative % % 37  43  47   Neutro Abs 1.7 - 7.7 K/uL 1.3Low  2.0  1.9   Lymphocytes Relative % 51  48  44   Lymphs Abs 0.7 - 4.0 K/uL 1.8  2.2  1.7   Monocytes Relative % 7  5  5    Monocytes Absolute 0 - 1 K/uL 0.2  0.2  0.2   Eosinophils Relative % 2  2  2    Eosinophils Absolute 0 - 0 K/uL 0.1  0.1  0.1   Basophils Relative % 2  2  1    Basophils Absolute 0 - 0 K/uL 0.1  0.1  0.1   Immature Granulocytes % 1  0  1   Abs Immature Granulocytes 0.00 - 0.07 K/uL 0.02  0.02 CM  0.02 CM   Comment: Performed at [facility], [address] [place], [place] [address]  Resulting Agency  [facility] CLIN LAB [facility]      Specimen Collected: 01/31/20 09:57 Last Resulted: 01/31/20 10:35     Lab Flowsheet   Order Details   View Encounter   Lab and Collection Details   Routing   Result History     CM=Additional comments    Result Care Coordination  Patient Communication  Add Comments Seen Back to Top      Other Results from 01/31/2020  Cancer antigen 27.29  Status:  Final result Visible to patient:  Yes (seen) Next appt:  03/06/2020 at 09:00 AM in Oncology ([facility]) Dx:  Carcinoma of lower-outer quadrant of ... Order: [id number]  0 Result Notes  Ref  Range & Units 3 wk ago 1 mo ago 2 mo ago  CA 27.29 0.0 - 38.6 U/mL 36.0  30.5 CM  29.3 CM   Comment: (NOTE)  [employer] Immunochemiluminometric Methodology [facility])  Values obtained with different assay methods or kits cannot be used  interchangeably. Results cannot be interpreted as absolute evidence  of the presence or absence of malignant disease.  Performed At: [facility]  [address] [place], [place] [id number]  Rush Farmer MD KG:[id number]   Resulting Agency  [facility] CLIN LAB [facility] CL[facility] LAB [facility] CL[facility] LAB      Specimen Collected: 01/31/20 09:57 Last Resulted: 02/01/20 04:36     Lab Flowsheet   Order Details   View Encounter   Lab and Collection Details   Routing   Result History     CM=Additional comments  Result Care Coordination  Patient Communication  Add Comments Seen Back to Top        Contains abnormal dataComprehensive metabolic panel  Status:  Final result Visible to patient:  Yes (seen) Next appt:  03/06/2020 at 09:00 AM in Oncology ([facility]) Dx:  Carcinoma of lower-outer quadrant of ... Order: [id number]  0 Result Notes  Ref Range & Units 3 wk ago 1 mo ago 2 mo ago  Sodium 135 - 145 mmol/L 134Low  135  134Low   Potassium 3.5 - 5.1 mmol/L 4.0  3.4Low  3.8   Chloride 98 - 111 mmol/L 98  102  102   CO2 22 - 32 mmol/L 24  22  22    Glucose, Bld 70 - 99 mg/dL 187High  143High CM  185High CM   Comment: Glucose reference range applies only to samples taken after fasting for at least 8 hours.  BUN 8 - 23 mg/dL 21  15  26High   Creatinine, Ser 0.44 - 1.00 mg/dL 1.10High  1.02High  1.07High   Calcium 8.9 - 10.3 mg/dL 9.4  8.8Low  9.6   Total Protein 6.5 - 8.1 g/dL 7.6  7.2  7.6   Albumin 3.5 - 5.0 g/dL 3.7  3.7  3.7   AST 15 - 41 U/L 33  24  27   ALT 0 - 44 U/L 24  18  20    Alkaline Phosphatase 38 - 126 U/L 76  73  82   Total Bilirubin 0.3 - 1.2 mg/dL 0.6  0.6  0.5   GFR calc non Af Amer >60 mL/min 53Low   58Low  54Low   GFR calc Af Amer >60 mL/min >60  >60  >60   Anion gap 5 - 15 12  11  CM  10 CM   Comment: Performed at [facility], [address] [place], [place] [address]  Resulting Agency  [facility] CLIN LAB [facility] CLIN LAB [name] LAB      Specimen Collected: 01/31/20 09:57 Last Resulted: 01/31/20 10:47

## 2020-02-21 NOTE — Telephone Encounter (Signed)
Ok  re-start verzinio. I just sent the refill. Thanks, GB

## 2020-02-21 NOTE — Telephone Encounter (Signed)
Dr. Jacinto Reap- Are you still holding her verzenio?

## 2020-02-25 ENCOUNTER — Encounter: Payer: Self-pay | Admitting: Internal Medicine

## 2020-02-25 ENCOUNTER — Other Ambulatory Visit: Payer: Self-pay | Admitting: Internal Medicine

## 2020-02-25 MED ORDER — CIPROFLOXACIN HCL 500 MG PO TABS
500.0000 mg | ORAL_TABLET | Freq: Two times a day (BID) | ORAL | 0 refills | Status: DC
Start: 2020-02-25 — End: 2020-04-03

## 2020-03-05 ENCOUNTER — Other Ambulatory Visit: Payer: Self-pay

## 2020-03-05 DIAGNOSIS — C50511 Malignant neoplasm of lower-outer quadrant of right female breast: Secondary | ICD-10-CM

## 2020-03-06 ENCOUNTER — Inpatient Hospital Stay: Payer: PPO

## 2020-03-06 ENCOUNTER — Encounter: Payer: Self-pay | Admitting: Internal Medicine

## 2020-03-06 ENCOUNTER — Other Ambulatory Visit: Payer: Self-pay

## 2020-03-06 ENCOUNTER — Inpatient Hospital Stay: Payer: PPO | Attending: Internal Medicine

## 2020-03-06 ENCOUNTER — Inpatient Hospital Stay: Payer: PPO | Admitting: Internal Medicine

## 2020-03-06 DIAGNOSIS — I10 Essential (primary) hypertension: Secondary | ICD-10-CM | POA: Diagnosis not present

## 2020-03-06 DIAGNOSIS — Z5111 Encounter for antineoplastic chemotherapy: Secondary | ICD-10-CM | POA: Insufficient documentation

## 2020-03-06 DIAGNOSIS — N183 Chronic kidney disease, stage 3 unspecified: Secondary | ICD-10-CM | POA: Insufficient documentation

## 2020-03-06 DIAGNOSIS — C7800 Secondary malignant neoplasm of unspecified lung: Secondary | ICD-10-CM | POA: Diagnosis not present

## 2020-03-06 DIAGNOSIS — C50511 Malignant neoplasm of lower-outer quadrant of right female breast: Secondary | ICD-10-CM | POA: Diagnosis not present

## 2020-03-06 DIAGNOSIS — Z803 Family history of malignant neoplasm of breast: Secondary | ICD-10-CM | POA: Insufficient documentation

## 2020-03-06 DIAGNOSIS — E7800 Pure hypercholesterolemia, unspecified: Secondary | ICD-10-CM | POA: Insufficient documentation

## 2020-03-06 DIAGNOSIS — Z794 Long term (current) use of insulin: Secondary | ICD-10-CM | POA: Diagnosis not present

## 2020-03-06 DIAGNOSIS — Z170 Estrogen receptor positive status [ER+]: Secondary | ICD-10-CM | POA: Diagnosis not present

## 2020-03-06 DIAGNOSIS — Z833 Family history of diabetes mellitus: Secondary | ICD-10-CM | POA: Diagnosis not present

## 2020-03-06 DIAGNOSIS — Z9013 Acquired absence of bilateral breasts and nipples: Secondary | ICD-10-CM | POA: Diagnosis not present

## 2020-03-06 DIAGNOSIS — Z79899 Other long term (current) drug therapy: Secondary | ICD-10-CM | POA: Diagnosis not present

## 2020-03-06 DIAGNOSIS — E119 Type 2 diabetes mellitus without complications: Secondary | ICD-10-CM | POA: Diagnosis not present

## 2020-03-06 LAB — CBC WITH DIFFERENTIAL/PLATELET
Abs Immature Granulocytes: 0.01 10*3/uL (ref 0.00–0.07)
Basophils Absolute: 0 10*3/uL (ref 0.0–0.1)
Basophils Relative: 2 %
Eosinophils Absolute: 0.1 10*3/uL (ref 0.0–0.5)
Eosinophils Relative: 2 %
HCT: 31.8 % — ABNORMAL LOW (ref 36.0–46.0)
Hemoglobin: 11.2 g/dL — ABNORMAL LOW (ref 12.0–15.0)
Immature Granulocytes: 0 %
Lymphocytes Relative: 45 %
Lymphs Abs: 1.2 10*3/uL (ref 0.7–4.0)
MCH: 33.1 pg (ref 26.0–34.0)
MCHC: 35.2 g/dL (ref 30.0–36.0)
MCV: 94.1 fL (ref 80.0–100.0)
Monocytes Absolute: 0.2 10*3/uL (ref 0.1–1.0)
Monocytes Relative: 6 %
Neutro Abs: 1.3 10*3/uL — ABNORMAL LOW (ref 1.7–7.7)
Neutrophils Relative %: 45 %
Platelets: 221 10*3/uL (ref 150–400)
RBC: 3.38 MIL/uL — ABNORMAL LOW (ref 3.87–5.11)
RDW: 13.8 % (ref 11.5–15.5)
WBC: 2.8 10*3/uL — ABNORMAL LOW (ref 4.0–10.5)
nRBC: 0 % (ref 0.0–0.2)

## 2020-03-06 LAB — COMPREHENSIVE METABOLIC PANEL
ALT: 19 U/L (ref 0–44)
AST: 27 U/L (ref 15–41)
Albumin: 3.3 g/dL — ABNORMAL LOW (ref 3.5–5.0)
Alkaline Phosphatase: 95 U/L (ref 38–126)
Anion gap: 10 (ref 5–15)
BUN: 17 mg/dL (ref 8–23)
CO2: 23 mmol/L (ref 22–32)
Calcium: 9.4 mg/dL (ref 8.9–10.3)
Chloride: 102 mmol/L (ref 98–111)
Creatinine, Ser: 1.08 mg/dL — ABNORMAL HIGH (ref 0.44–1.00)
GFR calc Af Amer: >60 mL/min (ref >60)
GFR calc non Af Amer: 54 mL/min — ABNORMAL LOW (ref >60)
Glucose, Bld: 273 mg/dL — ABNORMAL HIGH (ref 70–99)
Potassium: 4.1 mmol/L (ref 3.5–5.1)
Sodium: 135 mmol/L (ref 135–145)
Total Bilirubin: 0.4 mg/dL (ref 0.3–1.2)
Total Protein: 6.8 g/dL (ref 6.5–8.1)

## 2020-03-06 MED ORDER — FULVESTRANT 250 MG/5ML IM SOLN
500.0000 mg | Freq: Once | INTRAMUSCULAR | Status: AC
  Administered 2020-03-06: 500 mg via INTRAMUSCULAR

## 2020-03-06 NOTE — Progress Notes (Signed)
.[place] OFFICE PROGRESS NOTE  Patient Care Team: Patient, No Pcp Per as PCP - General ([place]) Kimberly Mc, MD (General Surgery) Kimberly Farrier, MD (Obstetrics and Gynecology) Kimberly Filbert, MD Kimberly Gleason, MD (Inactive) (Unknown Physician Specialty)  Cancer Staging Carcinoma of lower-outer quadrant of right breast in Kimberly [name], estrogen receptor positive [facility]) Staging form: Breast, AJCC 7th Edition - Pathologic: ypT1c,ypN2a, MX - Signed by Kimberly Lasso, MD on 03/10/2012    Oncology History Overview Note  # DEC 2012- RIGHT BREAST CA T2 N2 M0 tumor from biopsy.  Estrogen receptor positive, Progesterone receptor positive.  Current receptor negative by FISH 2. Neoadjuvant chemotherapy started in December of 28 with Cytoxan Adriamycin 3. Started on Taxol weekly chemotherapy. 4. Patient finished 12  cycles of Taxol chemotherapy in May of 2013.     5. Status post right modified radical mastectomy [Dr.Bowers; [place]] June of 2013, ypT1c  yp N2  MO. started also on Lerazole    7. Radiation therapy to the right breast (September of 2013).  Lymph node was positive for HER-2 receptor gene amplification of 2.22.  Will proceed with Herceptin treatment starting in September of 2013.   8.Patient has finished Herceptin (maintenance therapy) in August of 2014 8. Start patient on letrozole from November, 2013. 9. Patient started on Herceptin in September 2013.    10Patient finished 12 months of Herceptin therapy on August, 2014  # 6. Status post left side prophylactic mastectomy.  #Late MAY 2018-RECURRENCE BREAST CA- ER positive/PR negative; ?? HER-2/neu- [biopsy- proven-mediastinal lymph node; in suff for her 2 testing].  [elevated Tumor marker- CT/PET- uptake in Right Mediastinal LN; Sternum [June 2018 EBUS- Dr.Kasa]   # March 10 2017- faslodex + Abema; OCT 5th CT-PR of mediastinal LN [consent]  #August 2020-left chest wall nodule biopsy benign  #August  2019- DVT-right upper extremity; Eliquis; stop end of March 2021 [repeat ultrasound negative/patient preference] --------------------------------------------    DIAGNOSIS: [ BREAST CANCER- ER/PR/HER2 NEU POS  STAGE:  4   ;GOALS: PALLIATIVE  CURRENT/MOST RECENT THERAPY - ABEMA + FASLODEX    Carcinoma of lower-outer quadrant of right breast in Kimberly [name], estrogen receptor positive ([place])   INTERVAL HISTORY:  Kimberly Kimberly [name] 64 y.o.  Kimberly [name] pleasant patient above history of metastatic ER PR positive HER-2/neu positive breast cancer currently on abema+ Faslodex is here for follow-up  In the interim patient complained of UTI symptoms-currently ciprofloxacin symptoms resolved.  Denies abdominal pain nausea vomiting.  Denies any fevers or chills.  Review of Systems  Constitutional: Positive for malaise/fatigue. Negative for chills, diaphoresis, fever and weight loss.  HENT: Negative for nosebleeds and sore throat.   Eyes: Negative for double vision.  Respiratory: Negative for cough, hemoptysis, sputum production, shortness of breath and wheezing.   Cardiovascular: Negative for chest pain, palpitations, orthopnea and leg swelling.  Gastrointestinal: Positive for diarrhea. Negative for abdominal pain, blood in stool, constipation, heartburn, melena, nausea and vomiting.  Musculoskeletal: Positive for back pain and joint pain.  Skin: Positive for rash.  Neurological: Negative for dizziness, tingling, focal weakness, weakness and headaches.  Endo/Heme/Allergies: Does not bruise/bleed easily.  Psychiatric/Behavioral: Negative for depression. The patient is not nervous/anxious and does not have insomnia.       PAST MEDICAL HISTORY :  Past Medical History:  Diagnosis Date  . Arthritis   . Cancer of lower-outer quadrant of Kimberly [name] breast ([place]) 08/06/2011   RIGHT, chemo and bilateral mastectomies.  . High cholesterol   . History of kidney stones   .  Hx of bilateral breast implants   .  Hypertension   . Lung metastases ([place]) 20118   chemo pills and faslidex shots.  Marland Kitchen PONV (postoperative nausea and vomiting)   . Type II diabetes mellitus ([facility])    fasting 140-150  . Vertigo    LAST WEEK  . Vertigo 01/2017    PAST SURGICAL HISTORY :   Past Surgical History:  Procedure Laterality Date  . BREAST BIOPSY  07/2011, 2020   right  . BREAST RECONSTRUCTION  03/07/2012   Procedure: BREAST RECONSTRUCTION;  Surgeon: Kimberly Reese, MD;  Location: [place];  Service: Plastics;  Laterality: Left;  BREAST RECONSTRUCTION WITH PLACEMENT OF TISSUE EXPANDER TO LEFT BREAST  . BREAST RECONSTRUCTION  02/06/2013  . CESAREAN SECTION  [id number]  . ENDOBRONCHIAL ULTRASOUND N/A 02/24/2017   Procedure: ENDOBRONCHIAL ULTRASOUND;  Surgeon: Kimberly Lipps, MD;  Location: [facility];  Service: Cardiopulmonary;  Laterality: N/A;  . LATISSIMUS FLAP TO BREAST Right 02/06/2013   Procedure: LATISSIMUS FLAP TO RIGHT BREAST WITH IMPLANT;  Surgeon: Kimberly Reese, MD;  Location: [place];  Service: Plastics;  Laterality: Right;  . MASTECTOMY  03/07/12   modified right; total left  . MODIFIED MASTECTOMY  03/07/2012   Procedure: MODIFIED MASTECTOMY;  Surgeon: Kimberly Lasso, MD;  Location: [place];  Service: General;  Laterality: Right;  . PORTACATH PLACEMENT  07/2011  . SCAR REVISION  03/30/2012   Procedure: SCAR REVISION;  Surgeon: Kimberly Lasso, MD;  Location: [place];  Service: General;  Laterality: Right;  CLOSURE OF MASTECTOMY INCISION  . WISDOM TOOTH EXTRACTION      FAMILY HISTORY :   Family History  Problem Relation Age of Onset  . Breast cancer Mother   . Diabetes Father     SOCIAL HISTORY:   Social History   Tobacco Use  . Smoking status: Never Smoker  . Smokeless tobacco: Never Used  Vaping Use  . Vaping Use: Never used  Substance Use Topics  . Alcohol use: No  . Drug use: No    ALLERGIES:  is allergic to sulfa antibiotics.  MEDICATIONS:  Current Outpatient Medications   Medication Sig Dispense Refill  . atorvastatin (LIPITOR) 20 MG tablet Take 1 tablet (20 mg total) by mouth daily. 90 tablet 3  . betamethasone valerate (VALISONE) 0.1 % cream Apply 0.1 application topically as needed.    . fulvestrant (FASLODEX) 250 MG/5ML injection Inject into the muscle every 30 (thirty) days. One injection each buttock over 1-2 minutes. Warm prior to use.    Marland Kitchen glipiZIDE (GLUCOTROL XL) 10 MG 24 hr tablet Take 1 tablet (10 mg total) by mouth 2 (two) times daily. 180 tablet 3  . insulin aspart (NOVOLOG FLEXPEN) 100 UNIT/ML FlexPen Inject 4 Units into the skin 3 (three) times daily with meals. 15 mL 0  . Insulin Detemir (LEVEMIR) 100 UNIT/ML Pen Inject 20 Units into the skin at bedtime. 15 mL 0  . levothyroxine (SYNTHROID) 25 MCG tablet TAKE 1 TABLET (25 MCG TOTAL) BY MOUTH DAILY BEFORE BREAKFAST. 90 tablet 1  . lisinopril (ZESTRIL) 10 MG tablet Take 10 mg by mouth daily.    Marland Kitchen loperamide (IMODIUM A-D) 2 MG tablet Take 2 mg by mouth 4 (four) times daily as needed for diarrhea or loose stools.    . mupirocin ointment (BACTROBAN) 2 % mupirocin 2 % topical ointment  APPLY 1 APPLICATION TOPICALLY 3 (THREE) TIMES DAILY.    Marland Kitchen VERZENIO 100 MG tablet TAKE 1 TABLET BY MOUTH TWICE  DAILY 56 tablet 0  . Calcium Carbonate-Vitamin D (CALCIUM 600 + D PO) Take 1 tablet by mouth 2 (two) times daily.    . ciprofloxacin (CIPRO) 500 MG tablet Take 1 tablet (500 mg total) by mouth 2 (two) times daily. (Patient not taking: Reported on 03/06/2020) 10 tablet 0  . fluconazole (DIFLUCAN) 150 MG tablet Take 1 tablet (150 mg total) by mouth daily. Take another tablet in 72 hours if yeast infection not resolved. (Patient not taking: Reported on 01/31/2020) 2 tablet 0  . ondansetron (ZOFRAN) 8 MG tablet Take 1 tablet (8 mg total) by mouth every 8 (eight) hours as needed for nausea or vomiting. (Patient not taking: Reported on 01/31/2020) 20 tablet 3  . ondansetron (ZOFRAN-ODT) 4 MG disintegrating tablet Take 1 tablet  (4 mg total) by mouth every 8 (eight) hours as needed for nausea or vomiting. (Patient not taking: Reported on 01/31/2020) 20 tablet 3  . promethazine (PHENERGAN) 25 MG tablet Take 1 tablet (25 mg total) by mouth every 6 (six) hours as needed for nausea or vomiting. (Patient not taking: Reported on 01/31/2020) 30 tablet 0  . terbinafine (LAMISIL) 250 MG tablet Take 1 tablet (250 mg total) by mouth daily. (Patient not taking: Reported on 03/06/2020) 14 tablet 0   No current facility-administered medications for this visit.    PHYSICAL EXAMINATION: ECOG PERFORMANCE STATUS: 1 - Symptomatic but completely ambulatory  BP 132/79   Pulse 90   Temp 97.6 F (36.4 C)   Resp 20   Wt 173 lb (78.5 kg)   SpO2 99%   BMI 31.64 kg/m   Filed Weights   03/06/20 0927  Weight: 173 lb (78.5 kg)    Physical Exam Constitutional:      Comments: She is alone.   HENT:     Head: Normocephalic and atraumatic.     Mouth/Throat:     Pharynx: No oropharyngeal exudate.  Eyes:     Pupils: Pupils are equal, round, and reactive to light.  Cardiovascular:     Rate and Rhythm: Normal rate and regular rhythm.  Pulmonary:     Effort: Pulmonary effort is normal. No respiratory distress.     Breath sounds: Normal breath sounds. No wheezing.  Abdominal:     General: Bowel sounds are normal. There is no distension.     Palpations: Abdomen is soft. There is no mass.     Tenderness: There is no abdominal tenderness. There is no guarding or rebound.  Musculoskeletal:        General: No tenderness. Normal range of motion.     Cervical back: Normal range of motion and neck supple.     Comments: Approximately 1 cm hard nodule felt in the anterior chest wall/left parasternal.  Skin:    General: Skin is warm.     Comments: Maculopapular rash in the left inguinal region/fold.  Neurological:     Mental Status: She is alert and oriented to person, place, and time.  Psychiatric:        Mood and Affect: Affect normal.      LABORATORY DATA:  I have reviewed the data as listed    Component Value Date/Time   NA 135 03/06/2020 0856   NA 135 11/28/2014 1401   K 4.1 03/06/2020 0856   K 4.7 11/28/2014 1401   CL 102 03/06/2020 0856   CL 99 (L) 11/28/2014 1401   CO2 23 03/06/2020 0856   CO2 28 11/28/2014 1401   GLUCOSE 273 (H) 03/06/2020  0856   GLUCOSE 215 (H) 11/28/2014 1401   BUN 17 03/06/2020 0856   BUN 16 11/28/2014 1401   CREATININE 1.08 (H) 03/06/2020 0856   CREATININE 0.96 11/28/2014 1401   CALCIUM 9.4 03/06/2020 0856   CALCIUM 9.9 11/28/2014 1401   PROT 6.8 03/06/2020 0856   PROT 7.6 11/28/2014 1401   ALBUMIN 3.3 (L) 03/06/2020 0856   ALBUMIN 4.5 11/28/2014 1401   AST 27 03/06/2020 0856   AST 26 11/28/2014 1401   ALT 19 03/06/2020 0856   ALT 28 11/28/2014 1401   ALKPHOS 95 03/06/2020 0856   ALKPHOS 78 11/28/2014 1401   BILITOT 0.4 03/06/2020 0856   BILITOT 0.5 11/28/2014 1401   GFRNONAA 54 (L) 03/06/2020 0856   GFRNONAA >60 11/28/2014 1401   GFRAA >60 03/06/2020 0856   GFRAA >60 11/28/2014 1401    No results found for: SPEP, UPEP  Lab Results  Component Value Date   WBC 2.8 (L) 03/06/2020   NEUTROABS 1.3 (L) 03/06/2020   HGB 11.2 (L) 03/06/2020   HCT 31.8 (L) 03/06/2020   MCV 94.1 03/06/2020   PLT 221 03/06/2020      Chemistry      Component Value Date/Time   NA 135 03/06/2020 0856   NA 135 11/28/2014 1401   K 4.1 03/06/2020 0856   K 4.7 11/28/2014 1401   CL 102 03/06/2020 0856   CL 99 (L) 11/28/2014 1401   CO2 23 03/06/2020 0856   CO2 28 11/28/2014 1401   BUN 17 03/06/2020 0856   BUN 16 11/28/2014 1401   CREATININE 1.08 (H) 03/06/2020 0856   CREATININE 0.96 11/28/2014 1401      Component Value Date/Time   CALCIUM 9.4 03/06/2020 0856   CALCIUM 9.9 11/28/2014 1401   ALKPHOS 95 03/06/2020 0856   ALKPHOS 78 11/28/2014 1401   AST 27 03/06/2020 0856   AST 26 11/28/2014 1401   ALT 19 03/06/2020 0856   ALT 28 11/28/2014 1401   BILITOT 0.4 03/06/2020 0856    BILITOT 0.5 11/28/2014 1401       RADIOGRAPHIC STUDIES: I have personally reviewed the radiological images as listed and agreed with the findings in the report. No results found.   ASSESSMENT & PLAN:  Carcinoma of lower-outer quadrant of right breast in Kimberly [name], estrogen receptor positive ([place])  # Recurrent breast cancer-ER positive PR negative ? HER-2/neu-currently on Faslodex plus abema.  FEB 2021- CT A/P-NEG. STABLE. No evidence of any progressive metastatic disease-STABLE.   # Continue Faslodex; hold Abema 100 BID; Labs today-white count-2.3;ANC- 1.3;   Will order scans today.   #Clinically UTI- s/p cipro- resolved.    # CKD- stage III- monitor closely-STABLE.   #DISPOSITION: # Faslodex/today  # Follow up 1 month MD/ labs (CBC/CMP/Ca 27-29) Faslodex;CT C/A/P-Dr.B      Orders Placed This Encounter  Procedures  . CT Abdomen Pelvis W Contrast    Standing Status:   Future    Standing Expiration Date:   03/06/2021    Order Specific Question:   ** REASON FOR EXAM (FREE TEXT)    Answer:   breast cancer    Order Specific Question:   If indicated for the ordered procedure, I authorize the administration of contrast media per Radiology protocol    Answer:   Yes    Order Specific Question:   Preferred imaging location?    Answer:   [facility]    Order Specific Question:   Is Oral Contrast requested for this exam?  Answer:   Yes, Per Radiology protocol    Order Specific Question:   Radiology Contrast Protocol - do NOT remove file path    Answer:   \\[email or web address]  . CT Chest W Contrast    Standing Status:   Future    Standing Expiration Date:   03/06/2021    Order Specific Question:   ** REASON FOR EXAM (FREE TEXT)    Answer:   breast cancer    Order Specific Question:   If indicated for the ordered procedure, I authorize the administration of contrast media per Radiology protocol    Answer:   Yes    Order Specific Question:   Preferred imaging  location?    Answer:   [facility]    Order Specific Question:   Radiology Contrast Protocol - do NOT remove file path    Answer:   \\[email or web address]  . CBC with Differential/Platelet    Standing Status:   Future    Standing Expiration Date:   03/06/2021  . Comprehensive metabolic panel    Standing Status:   Future    Standing Expiration Date:   03/06/2021  . Cancer antigen 27.29    Standing Status:   Future    Standing Expiration Date:   03/06/2021   All questions were answered. The patient knows to call the clinic with any problems, questions or concerns.      [name], MD 03/06/2020 12:20 PM

## 2020-03-06 NOTE — Assessment & Plan Note (Addendum)
#  Recurrent breast cancer-ER positive PR negative ? HER-2/neu-currently on Faslodex plus abema.  FEB 2021- CT A/P-NEG. STABLE. No evidence of any progressive metastatic disease-STABLE.   # Continue Faslodex; hold Abema 100 BID; Labs today-white count-2.3;ANC- 1.3;   Will order scans today.   #Clinically UTI- s/p cipro- resolved.    # CKD- stage III- monitor closely-STABLE.   #DISPOSITION: # Faslodex/today  # Follow up 1 month MD/ labs (CBC/CMP/Ca 27-29) Faslodex;CT C/A/P-Dr.B

## 2020-03-07 LAB — CANCER ANTIGEN 27.29: CA 27.29: 28.9 U/mL (ref 0.0–38.6)

## 2020-03-17 ENCOUNTER — Other Ambulatory Visit: Payer: Self-pay | Admitting: Internal Medicine

## 2020-03-17 DIAGNOSIS — C50511 Malignant neoplasm of lower-outer quadrant of right female breast: Secondary | ICD-10-CM

## 2020-03-24 ENCOUNTER — Other Ambulatory Visit: Payer: Self-pay | Admitting: *Deleted

## 2020-03-24 DIAGNOSIS — E039 Hypothyroidism, unspecified: Secondary | ICD-10-CM

## 2020-03-24 MED ORDER — LEVOTHYROXINE SODIUM 25 MCG PO TABS: 25.0000 ug | ORAL_TABLET | Freq: Every day | ORAL | 1 refills | Status: AC

## 2020-04-02 ENCOUNTER — Ambulatory Visit
Admission: RE | Admit: 2020-04-02 | Discharge: 2020-04-02 | Disposition: A | Discharge status: Home / Self Care | Payer: PPO | Source: Ambulatory Visit | Attending: Internal Medicine | Admitting: Internal Medicine

## 2020-04-02 ENCOUNTER — Other Ambulatory Visit: Payer: PPO

## 2020-04-02 ENCOUNTER — Telehealth: Payer: Self-pay | Admitting: Internal Medicine

## 2020-04-02 ENCOUNTER — Other Ambulatory Visit: Payer: Self-pay

## 2020-04-02 DIAGNOSIS — C50511 Malignant neoplasm of lower-outer quadrant of right female breast: Secondary | ICD-10-CM | POA: Insufficient documentation

## 2020-04-02 DIAGNOSIS — C7800 Secondary malignant neoplasm of unspecified lung: Secondary | ICD-10-CM | POA: Diagnosis not present

## 2020-04-02 DIAGNOSIS — Z170 Estrogen receptor positive status [ER+]: Secondary | ICD-10-CM | POA: Diagnosis not present

## 2020-04-02 DIAGNOSIS — C50911 Malignant neoplasm of unspecified site of right female breast: Secondary | ICD-10-CM | POA: Diagnosis not present

## 2020-04-02 MED ORDER — IOHEXOL 300 MG/ML  SOLN
100.0000 mL | Freq: Once | INTRAMUSCULAR | Status: AC | PRN
  Administered 2020-04-02: 100 mL via INTRAVENOUS

## 2020-04-02 NOTE — Telephone Encounter (Signed)
Left voicemail for the patient-informing CT no evidence of recurrent disease.  Follow-up as planned

## 2020-04-03 ENCOUNTER — Encounter: Payer: Self-pay | Admitting: Internal Medicine

## 2020-04-03 ENCOUNTER — Other Ambulatory Visit: Payer: Self-pay

## 2020-04-04 ENCOUNTER — Inpatient Hospital Stay: Payer: PPO | Admitting: Internal Medicine

## 2020-04-04 ENCOUNTER — Inpatient Hospital Stay: Payer: PPO | Attending: Internal Medicine

## 2020-04-04 ENCOUNTER — Inpatient Hospital Stay: Payer: PPO

## 2020-04-04 VITALS — BP 119/71 | HR 103 | Temp 98.7°F | Resp 16 | Ht 62.0 in | Wt 175.0 lb

## 2020-04-04 DIAGNOSIS — Z923 Personal history of irradiation: Secondary | ICD-10-CM | POA: Insufficient documentation

## 2020-04-04 DIAGNOSIS — Z803 Family history of malignant neoplasm of breast: Secondary | ICD-10-CM | POA: Insufficient documentation

## 2020-04-04 DIAGNOSIS — C50511 Malignant neoplasm of lower-outer quadrant of right female breast: Secondary | ICD-10-CM | POA: Insufficient documentation

## 2020-04-04 DIAGNOSIS — Z170 Estrogen receptor positive status [ER+]: Secondary | ICD-10-CM

## 2020-04-04 DIAGNOSIS — Z833 Family history of diabetes mellitus: Secondary | ICD-10-CM | POA: Insufficient documentation

## 2020-04-04 DIAGNOSIS — B369 Superficial mycosis, unspecified: Secondary | ICD-10-CM | POA: Diagnosis not present

## 2020-04-04 DIAGNOSIS — E119 Type 2 diabetes mellitus without complications: Secondary | ICD-10-CM | POA: Insufficient documentation

## 2020-04-04 DIAGNOSIS — I10 Essential (primary) hypertension: Secondary | ICD-10-CM | POA: Insufficient documentation

## 2020-04-04 DIAGNOSIS — Z79899 Other long term (current) drug therapy: Secondary | ICD-10-CM | POA: Diagnosis not present

## 2020-04-04 DIAGNOSIS — N183 Chronic kidney disease, stage 3 unspecified: Secondary | ICD-10-CM | POA: Insufficient documentation

## 2020-04-04 DIAGNOSIS — Z794 Long term (current) use of insulin: Secondary | ICD-10-CM | POA: Insufficient documentation

## 2020-04-04 DIAGNOSIS — Z5111 Encounter for antineoplastic chemotherapy: Secondary | ICD-10-CM | POA: Diagnosis not present

## 2020-04-04 DIAGNOSIS — Z9221 Personal history of antineoplastic chemotherapy: Secondary | ICD-10-CM | POA: Insufficient documentation

## 2020-04-04 DIAGNOSIS — Z9013 Acquired absence of bilateral breasts and nipples: Secondary | ICD-10-CM | POA: Insufficient documentation

## 2020-04-04 DIAGNOSIS — E7800 Pure hypercholesterolemia, unspecified: Secondary | ICD-10-CM | POA: Insufficient documentation

## 2020-04-04 LAB — CBC WITH DIFFERENTIAL/PLATELET
Abs Immature Granulocytes: 0.01 10*3/uL (ref 0.00–0.07)
Basophils Absolute: 0.1 10*3/uL (ref 0.0–0.1)
Basophils Relative: 2 %
Eosinophils Absolute: 0.1 10*3/uL (ref 0.0–0.5)
Eosinophils Relative: 3 %
HCT: 34.4 % — ABNORMAL LOW (ref 36.0–46.0)
Hemoglobin: 12.2 g/dL (ref 12.0–15.0)
Immature Granulocytes: 0 %
Lymphocytes Relative: 45 %
Lymphs Abs: 1.8 10*3/uL (ref 0.7–4.0)
MCH: 33.6 pg (ref 26.0–34.0)
MCHC: 35.5 g/dL (ref 30.0–36.0)
MCV: 94.8 fL (ref 80.0–100.0)
Monocytes Absolute: 0.2 10*3/uL (ref 0.1–1.0)
Monocytes Relative: 5 %
Neutro Abs: 1.8 10*3/uL (ref 1.7–7.7)
Neutrophils Relative %: 45 %
Platelets: 223 10*3/uL (ref 150–400)
RBC: 3.63 MIL/uL — ABNORMAL LOW (ref 3.87–5.11)
RDW: 13.1 % (ref 11.5–15.5)
WBC: 3.9 10*3/uL — ABNORMAL LOW (ref 4.0–10.5)
nRBC: 0 % (ref 0.0–0.2)

## 2020-04-04 LAB — COMPREHENSIVE METABOLIC PANEL
ALT: 22 U/L (ref 0–44)
AST: 33 U/L (ref 15–41)
Albumin: 3.6 g/dL (ref 3.5–5.0)
Alkaline Phosphatase: 94 U/L (ref 38–126)
Anion gap: 11 (ref 5–15)
BUN: 18 mg/dL (ref 8–23)
CO2: 22 mmol/L (ref 22–32)
Calcium: 9.1 mg/dL (ref 8.9–10.3)
Chloride: 99 mmol/L (ref 98–111)
Creatinine, Ser: 1.04 mg/dL — ABNORMAL HIGH (ref 0.44–1.00)
GFR calc Af Amer: >60 mL/min (ref >60)
GFR calc non Af Amer: 56 mL/min — ABNORMAL LOW (ref >60)
Glucose, Bld: 262 mg/dL — ABNORMAL HIGH (ref 70–99)
Potassium: 4.2 mmol/L (ref 3.5–5.1)
Sodium: 132 mmol/L — ABNORMAL LOW (ref 135–145)
Total Bilirubin: 0.5 mg/dL (ref 0.3–1.2)
Total Protein: 7.1 g/dL (ref 6.5–8.1)

## 2020-04-04 MED ORDER — FULVESTRANT 250 MG/5ML IM SOLN
500.0000 mg | Freq: Once | INTRAMUSCULAR | Status: AC
  Administered 2020-04-04: 500 mg via INTRAMUSCULAR
  Filled 2020-04-04: qty 10

## 2020-04-04 MED ORDER — TERBINAFINE HCL 250 MG PO TABS: 250.0000 mg | ORAL_TABLET | Freq: Every day | ORAL | 0 refills | Status: DC

## 2020-04-04 NOTE — Assessment & Plan Note (Addendum)
#  Recurrent breast cancer-ER positive PR negative ? HER-2/neu-currently on Faslodex plus abema.  AUG 4th 2021- CT A/P- No evidence of any progressive metastatic disease- STABLE.   # Continue Faslodex; CONTINUE Abema 100 BID; Labs today-white count-3.2 ;ANC- 1.8     # CKD- stage III- monitor closely-STABLE.  # Fungal dermatitis: poor reposne to topical; start terbinafine x 7 days. Script sent.    #DISPOSITION: # Faslodex/today  # Follow up 1 month MD/ labs (CBC/CMP/Ca 27-29) Faslodex; -Dr.B   # I reviewed the blood work- with the patient in detail; also reviewed the imaging independently [as summarized above]; and with the patient in detail.   Addendum: pt will continue abema as ordered; will refill

## 2020-04-05 LAB — CANCER ANTIGEN 27.29: CA 27.29: 25.9 U/mL (ref 0.0–38.6)

## 2020-04-06 NOTE — Progress Notes (Signed)
.[place] OFFICE PROGRESS NOTE  Patient Care Team: Patient, No Pcp Per as PCP - General ([place]) Neldon Mc, MD (General Surgery) Everlene Farrier, MD (Obstetrics and Gynecology) Noreene Filbert, MD Forest Gleason, MD (Inactive) (Unknown Physician Specialty)  Cancer Staging Carcinoma of lower-outer quadrant of right breast in female, estrogen receptor positive [facility]) Staging form: Breast, AJCC 7th Edition - Pathologic: ypT1c,ypN2a, MX - Signed by Haywood Lasso, MD on 03/10/2012    Oncology History Overview Note  # DEC 2012- RIGHT BREAST CA T2 N2 M0 tumor from biopsy.  Estrogen receptor positive, Progesterone receptor positive.  Current receptor negative by FISH 2. Neoadjuvant chemotherapy started in December of 28 with Cytoxan Adriamycin 3. Started on Taxol weekly chemotherapy. 4. Patient finished 12  cycles of Taxol chemotherapy in May of 2013.     5. Status post right modified radical mastectomy [Dr.Bowers; [place]] June of 2013, ypT1c  yp N2  MO. started also on Lerazole    7. Radiation therapy to the right breast (September of 2013).  Lymph node was positive for HER-2 receptor gene amplification of 2.22.  Will proceed with Herceptin treatment starting in September of 2013.   8.Patient has finished Herceptin (maintenance therapy) in August of 2014 8. Start patient on letrozole from November, 2013. 9. Patient started on Herceptin in September 2013.    10Patient finished 12 months of Herceptin therapy on August, 2014  # 6. Status post left side prophylactic mastectomy.  #Late MAY 2018-RECURRENCE BREAST CA- ER positive/PR negative; ?? HER-2/neu- [biopsy- proven-mediastinal lymph node; in suff for her 2 testing].  [elevated Tumor marker- CT/PET- uptake in Right Mediastinal LN; Sternum [June 2018 EBUS- Dr.Kasa]   # March 10 2017- faslodex + Abema; OCT 5th CT-PR of mediastinal LN [consent]  #August 2020-left chest wall nodule biopsy benign  #August  2019- DVT-right upper extremity; Eliquis; stop end of March 2021 [repeat ultrasound negative/patient preference] --------------------------------------------    DIAGNOSIS: [ BREAST CANCER- ER/PR/HER2 NEU POS  STAGE:  4   ;GOALS: PALLIATIVE  CURRENT/MOST RECENT THERAPY - ABEMA + FASLODEX    Carcinoma of lower-outer quadrant of right breast in female, estrogen receptor positive ([place])   INTERVAL HISTORY:  Kimberly [name] 64 y.o.  female pleasant patient above history of metastatic ER PR positive HER-2/neu positive breast cancer currently on abema+ Faslodex is here for follow-up/results of the CT scan.  Denies any new shortness of breath or cough.  No abdominal pain.  No fevers or chills.   Noted to have rash under the left abdominal pannus.  Review of Systems  Constitutional: Positive for malaise/fatigue. Negative for chills, diaphoresis, fever and weight loss.  HENT: Negative for nosebleeds and sore throat.   Eyes: Negative for double vision.  Respiratory: Negative for cough, hemoptysis, sputum production, shortness of breath and wheezing.   Cardiovascular: Negative for chest pain, palpitations, orthopnea and leg swelling.  Gastrointestinal: Positive for diarrhea. Negative for abdominal pain, blood in stool, constipation, heartburn, melena, nausea and vomiting.  Musculoskeletal: Positive for back pain and joint pain.  Skin: Positive for rash.  Neurological: Negative for dizziness, tingling, focal weakness, weakness and headaches.  Endo/Heme/Allergies: Does not bruise/bleed easily.  Psychiatric/Behavioral: Negative for depression. The patient is not nervous/anxious and does not have insomnia.       PAST MEDICAL HISTORY :  Past Medical History:  Diagnosis Date  . Arthritis   . Cancer of lower-outer quadrant of female breast ([place]) 08/06/2011   RIGHT, chemo and bilateral mastectomies.  Marland Kitchen  High cholesterol   . History of kidney stones   . Hx of bilateral breast implants   .  Hypertension   . Lung metastases ([place]) [address]18   chemo pills and faslidex shots.  Marland Kitchen PONV (postoperative nausea and vomiting)   . Type II diabetes mellitus ([facility])    fasting 140-150  . Vertigo    LAST WEEK  . Vertigo 01/2017    PAST SURGICAL HISTORY :   Past Surgical History:  Procedure Laterality Date  . BREAST BIOPSY  07/2011, 2020   right  . BREAST RECONSTRUCTION  03/07/2012   Procedure: BREAST RECONSTRUCTION;  Surgeon: Crissie Reese, MD;  Location: [facility];  Service: Plastics;  Laterality: Left;  BREAST RECONSTRUCTION WITH PLACEMENT OF TISSUE EXPANDER TO LEFT BREAST  . BREAST RECONSTRUCTION  02/06/2013  . CESAREAN SECTION  [id number]  . ENDOBRONCHIAL ULTRASOUND N/A 02/24/2017   Procedure: ENDOBRONCHIAL ULTRASOUND;  Surgeon: [name], MD;  Location: [facility];  Service: Cardiopulmonary;  Laterality: N/A;  . LATISSIMUS FLAP TO BREAST Right 02/06/2013   Procedure: LATISSIMUS FLAP TO RIGHT BREAST WITH IMPLANT;  Surgeon: Crissie Reese, MD;  Location: [place] [facility];  Service: Plastics;  Laterality: Right;  . MASTECTOMY  03/07/12   modified right; total left  . MODIFIED MASTECTOMY  03/07/2012   Procedure: MODIFIED MASTECTOMY;  Surgeon: Haywood Lasso, MD;  Location: [facility];  Service: General;  Laterality: Right;  . PORTACATH PLACEMENT  07/2011  . SCAR REVISION  03/30/2012   Procedure: SCAR REVISION;  Surgeon: Haywood Lasso, MD;  Location: [facility];  Service: General;  Laterality: Right;  CLOSURE OF MASTECTOMY INCISION  . WISDOM TOOTH EXTRACTION      FAMILY HISTORY :   Family History  Problem Relation Age of Onset  . Breast cancer Mother   . Diabetes Father     SOCIAL HISTORY:   Social History   Tobacco Use  . Smoking status: Never Smoker  . Smokeless tobacco: Never Used  Vaping Use  . Vaping Use: Never used  Substance Use Topics  . Alcohol use: No  . Drug use: No    ALLERGIES:  is allergic to sulfa antibiotics.  MEDICATIONS:  Current Outpatient Medications   Medication Sig Dispense Refill  . atorvastatin (LIPITOR) 20 MG tablet Take 1 tablet (20 mg total) by mouth daily. 90 tablet 3  . betamethasone valerate (VALISONE) 0.1 % cream Apply 0.1 application topically as needed.    . Calcium Carbonate-Vitamin D (CALCIUM 600 + D PO) Take 1 tablet by mouth 2 (two) times daily.    . fulvestrant (FASLODEX) 250 MG/5ML injection Inject into the muscle every 30 (thirty) days. One injection each buttock over 1-2 minutes. Warm prior to use.    Marland Kitchen glipiZIDE (GLUCOTROL XL) 10 MG 24 hr tablet Take 1 tablet (10 mg total) by mouth 2 (two) times daily. 180 tablet 3  . insulin aspart (NOVOLOG FLEXPEN) 100 UNIT/ML FlexPen Inject 4 Units into the skin 3 (three) times daily with meals. 15 mL 0  . Insulin Detemir (LEVEMIR) 100 UNIT/ML Pen Inject 20 Units into the skin at bedtime. 15 mL 0  . levothyroxine (SYNTHROID) 25 MCG tablet Take 1 tablet (25 mcg total) by mouth daily before breakfast. 90 tablet 1  . lisinopril (ZESTRIL) 10 MG tablet Take 10 mg by mouth daily.    Marland Kitchen VERZENIO 100 MG tablet TAKE 1 TABLET BY MOUTH TWICE DAILY 56 tablet 0  . fluconazole (DIFLUCAN) 150 MG tablet Take 1 tablet (150 mg  total) by mouth daily. Take another tablet in 72 hours if yeast infection not resolved. (Patient not taking: Reported on 01/31/2020) 2 tablet 0  . loperamide (IMODIUM A-D) 2 MG tablet Take 2 mg by mouth 4 (four) times daily as needed for diarrhea or loose stools. (Patient not taking: Reported on 04/03/2020)    . mupirocin ointment (BACTROBAN) 2 % mupirocin 2 % topical ointment  APPLY 1 APPLICATION TOPICALLY 3 (THREE) TIMES DAILY. (Patient not taking: Reported on 04/03/2020)    . ondansetron (ZOFRAN) 8 MG tablet Take 1 tablet (8 mg total) by mouth every 8 (eight) hours as needed for nausea or vomiting. (Patient not taking: Reported on 01/31/2020) 20 tablet 3  . ondansetron (ZOFRAN-ODT) 4 MG disintegrating tablet Take 1 tablet (4 mg total) by mouth every 8 (eight) hours as needed for nausea or  vomiting. (Patient not taking: Reported on 01/31/2020) 20 tablet 3  . promethazine (PHENERGAN) 25 MG tablet Take 1 tablet (25 mg total) by mouth every 6 (six) hours as needed for nausea or vomiting. (Patient not taking: Reported on 01/31/2020) 30 tablet 0  . terbinafine (LAMISIL) 250 MG tablet Take 1 tablet (250 mg total) by mouth daily. 14 tablet 0   No current facility-administered medications for this visit.    PHYSICAL EXAMINATION: ECOG PERFORMANCE STATUS: 1 - Symptomatic but completely ambulatory  BP 119/71 (BP Location: Left Arm, Patient Position: Sitting, Cuff Size: Normal)   Pulse (!) 103   Temp 98.7 F (37.1 C) (Tympanic)   Resp 16   Ht '5\' 2"'  (1.575 m)   Wt 175 lb (79.4 kg)   SpO2 98%   BMI 32.01 kg/m   Filed Weights   04/03/20 1558 04/04/20 1326  Weight: 175 lb (79.4 kg) 175 lb (79.4 kg)    Physical Exam Constitutional:      Comments: Kimberly [name] is alone.   HENT:     Head: Normocephalic and atraumatic.     Mouth/Throat:     Pharynx: No oropharyngeal exudate.  Eyes:     Pupils: Pupils are equal, round, and reactive to light.  Cardiovascular:     Rate and Rhythm: Normal rate and regular rhythm.  Pulmonary:     Effort: Pulmonary effort is normal. No respiratory distress.     Breath sounds: Normal breath sounds. No wheezing.  Abdominal:     General: Bowel sounds are normal. There is no distension.     Palpations: Abdomen is soft. There is no mass.     Tenderness: There is no abdominal tenderness. There is no guarding or rebound.  Musculoskeletal:        General: No tenderness. Normal range of motion.     Cervical back: Normal range of motion and neck supple.     Comments: Approximately 1 cm hard nodule felt in the anterior chest wall/left parasternal.  Skin:    General: Skin is warm.     Comments: Maculopapular rash in the left inguinal region/fold.  Neurological:     Mental Status: Kimberly [name] is alert and oriented to person, place, and time.  Psychiatric:        Mood and  Affect: Affect normal.     LABORATORY DATA:  I have reviewed the data as listed    Component Value Date/Time   NA 132 (L) 04/04/2020 1302   NA 135 11/28/2014 1401   K 4.2 04/04/2020 1302   K 4.7 11/28/2014 1401   CL 99 04/04/2020 1302   CL 99 (L) 11/28/2014 1401  CO2 22 04/04/2020 1302   CO2 28 11/28/2014 1401   GLUCOSE 262 (H) 04/04/2020 1302   GLUCOSE 215 (H) 11/28/2014 1401   BUN 18 04/04/2020 1302   BUN 16 11/28/2014 1401   CREATININE 1.04 (H) 04/04/2020 1302   CREATININE 0.96 11/28/2014 1401   CALCIUM 9.1 04/04/2020 1302   CALCIUM 9.9 11/28/2014 1401   PROT 7.1 04/04/2020 1302   PROT 7.6 11/28/2014 1401   ALBUMIN 3.6 04/04/2020 1302   ALBUMIN 4.5 11/28/2014 1401   AST 33 04/04/2020 1302   AST 26 11/28/2014 1401   ALT 22 04/04/2020 1302   ALT 28 11/28/2014 1401   ALKPHOS 94 04/04/2020 1302   ALKPHOS 78 11/28/2014 1401   BILITOT 0.5 04/04/2020 1302   BILITOT 0.5 11/28/2014 1401   GFRNONAA 56 (L) 04/04/2020 1302   GFRNONAA >60 11/28/2014 1401   GFRAA >60 04/04/2020 1302   GFRAA >60 11/28/2014 1401    No results found for: SPEP, UPEP  Lab Results  Component Value Date   WBC 3.9 (L) 04/04/2020   NEUTROABS 1.8 04/04/2020   HGB 12.2 04/04/2020   HCT 34.4 (L) 04/04/2020   MCV 94.8 04/04/2020   PLT 223 04/04/2020      Chemistry      Component Value Date/Time   NA 132 (L) 04/04/2020 1302   NA 135 11/28/2014 1401   K 4.2 04/04/2020 1302   K 4.7 11/28/2014 1401   CL 99 04/04/2020 1302   CL 99 (L) 11/28/2014 1401   CO2 22 04/04/2020 1302   CO2 28 11/28/2014 1401   BUN 18 04/04/2020 1302   BUN 16 11/28/2014 1401   CREATININE 1.04 (H) 04/04/2020 1302   CREATININE 0.96 11/28/2014 1401      Component Value Date/Time   CALCIUM 9.1 04/04/2020 1302   CALCIUM 9.9 11/28/2014 1401   ALKPHOS 94 04/04/2020 1302   ALKPHOS 78 11/28/2014 1401   AST 33 04/04/2020 1302   AST 26 11/28/2014 1401   ALT 22 04/04/2020 1302   ALT 28 11/28/2014 1401   BILITOT 0.5  04/04/2020 1302   BILITOT 0.5 11/28/2014 1401       RADIOGRAPHIC STUDIES: I have personally reviewed the radiological images as listed and agreed with the findings in the report. No results found.   ASSESSMENT & PLAN:  Carcinoma of lower-outer quadrant of right breast in female, estrogen receptor positive ([place])  # Recurrent breast cancer-ER positive PR negative ? HER-2/neu-currently on Faslodex plus abema.  AUG 4th 2021- CT A/P- No evidence of any progressive metastatic disease- STABLE.   # Continue Faslodex; hold Abema 100 BID; Labs today-white count-3.2 ;ANC- 1.8     # CKD- stage III- monitor closely-STABLE.  # Fungal dermatitis: poor reposne to topical; start terbinafine x 7 days. Script sent.    #DISPOSITION: # Faslodex/today  # Follow up 1 month MD/ labs (CBC/CMP/Ca 27-29) Faslodex; -Dr.B   # I reviewed the blood work- with the patient in detail; also reviewed the imaging independently [as summarized above]; and with the patient in detail.       Orders Placed This Encounter  Procedures  . CBC with Differential    Standing Status:   Standing    Number of Occurrences:   12    Standing Expiration Date:   04/04/2021  . Comprehensive metabolic panel    Standing Status:   Standing    Number of Occurrences:   12    Standing Expiration Date:   04/04/2021  . Cancer  antigen 27.29    Standing Status:   Standing    Number of Occurrences:   12    Standing Expiration Date:   04/04/2021   All questions were answered. The patient knows to call the clinic with any problems, questions or concerns.      [name], MD 04/06/2020 8:42 PM

## 2020-04-10 ENCOUNTER — Other Ambulatory Visit: Payer: Self-pay | Admitting: Internal Medicine

## 2020-04-10 DIAGNOSIS — C50511 Malignant neoplasm of lower-outer quadrant of right female breast: Secondary | ICD-10-CM

## 2020-04-10 NOTE — Telephone Encounter (Signed)
Dr. Jacinto Reap - your last last note states to HOLD the verzenio  # Continue Faslodex; hold Abema 100 BID; Labs today-white count-3.2 ;ANC- 1.8

## 2020-04-10 NOTE — Telephone Encounter (Signed)
CBC with Differential/Platelet Order: [id number] Status:  Final result Visible to patient:  Yes (seen) Next appt:  05/01/2020 at 09:00 AM in Oncology ([facility]) Dx:  Carcinoma of lower-outer quadrant of ...  0 Result Notes  Ref Range & Units 6 d ago 1 mo ago  WBC 4.0 - 10.5 K/uL 3.9Low  2.8Low   RBC 3.87 - 5.11 MIL/uL 3.63Low  3.38Low   Hemoglobin 12.0 - 15.0 g/dL 12.2  11.2Low   HCT 36 - 46 % 34.4Low  31.8Low   MCV 80.0 - 100.0 fL 94.8  94.1   MCH 26.0 - 34.0 pg 33.6  33.1   MCHC 30.0 - 36.0 g/dL 35.5  35.2   RDW 11.5 - 15.5 % 13.1  13.8   Platelets 150 - 400 K/uL 223  221   nRBC 0.0 - 0.2 % 0.0  0.0   Neutrophils Relative % % 45  45   Neutro Abs 1.7 - 7.7 K/uL 1.8  1.3Low   Lymphocytes Relative % 45  45   Lymphs Abs 0.7 - 4.0 K/uL 1.8  1.2   Monocytes Relative % 5  6   Monocytes Absolute 0 - 1 K/uL 0.2  0.2   Eosinophils Relative % 3  2   Eosinophils Absolute 0 - 0 K/uL 0.1  0.1   Basophils Relative % 2  2   Basophils Absolute 0 - 0 K/uL 0.1  0.0   Immature Granulocytes % 0  0   Abs Immature Granulocytes 0.00 - 0.07 K/uL 0.01  0.01 CM   Comment: Performed at [facility], [address] [place], [place] [address]  Resulting Agency  [facility] CLIN LAB [facility] CL[facility] LAB      Specimen Collected: 04/04/20 13:02 Last Resulted: 04/04/20 13:15     Lab Flowsheet   Order Details   View Encounter   Lab and Collection Details   Routing   Result History     CM=Additional comments    Result Care Coordination  Patient Communication  Add Comments Seen Back to Top      Other Results from 04/04/2020  Cancer antigen 27.29  Status:  Final result Visible to patient:  Yes (seen) Next appt:  05/01/2020 at 09:00 AM in Oncology ([facility]) Dx:  Carcinoma of lower-outer quadrant of ... Order: [id number]  0 Result Notes  Ref Range & Units 6 d ago 1 mo ago  CA 27.29 0.0 - 38.6 U/mL 25.9  28.9 CM   Comment: (NOTE)  [employer] Immunochemiluminometric  Methodology [facility])  Values obtained with different assay methods or kits cannot be used  interchangeably. Results cannot be interpreted as absolute evidence  of the presence or absence of malignant disease.  Performed At: [facility]  [address] [place], [place] [id number]  Rush Farmer MD NO:[id number]   Resulting Agency  [facility] CLIN LAB [facility] CLIN LAB      Specimen Collected: 04/04/20 13:02 Last Resulted: 04/05/20 03:36     Lab Flowsheet   Order Details   View Encounter   Lab and Collection Details   Routing   Result History     CM=Additional comments    Result Care Coordination  Patient Communication  Add Comments Seen Back to Top        Contains abnormal dataComprehensive metabolic panel  Status:  Final result Visible to patient:  Yes (seen) Next appt:  05/01/2020 at 09:00 AM in Oncology ([facility]) Dx:  Carcinoma of lower-outer quadrant of ... Order: [id number]  0 Result Notes  Ref Range & Units 6 d ago 1 mo ago  Sodium 135 - 145 mmol/L 132Low  135   Potassium 3.5 - 5.1 mmol/L 4.2  4.1   Chloride 98 - 111 mmol/L 99  102   CO2 22 - 32 mmol/L 22  23   Glucose, Bld 70 - 99 mg/dL 262High  273High CM   Comment: Glucose reference range applies only to samples taken after fasting for at least 8 hours.  BUN 8 - 23 mg/dL 18  17   Creatinine, Ser 0.44 - 1.00 mg/dL 1.04High  1.08High   Calcium 8.9 - 10.3 mg/dL 9.1  9.4   Total Protein 6.5 - 8.1 g/dL 7.1  6.8   Albumin 3.5 - 5.0 g/dL 3.6  3.3Low   AST 15 - 41 U/L 33  27   ALT 0 - 44 U/L 22  19   Alkaline Phosphatase 38 - 126 U/L 94  95   Total Bilirubin 0.3 - 1.2 mg/dL 0.5  0.4   GFR calc non Af Amer >60 mL/min 56Low  54Low   GFR calc Af Amer >60 mL/min >60  >60   Anion gap 5 - 15 11  10  CM   Comment: Performed at [facility], [address] [place], [place] [address]  Resulting Agency  [facility] CLIN LAB [facility] CLIN LAB      Specimen Collected: 04/04/20 13:02 Last Resulted:  04/04/20 13:31

## 2020-04-11 NOTE — Telephone Encounter (Signed)
Pt to continue the abema- I corrected my note- GB

## 2020-05-01 ENCOUNTER — Inpatient Hospital Stay: Payer: PPO | Admitting: Internal Medicine

## 2020-05-01 ENCOUNTER — Encounter: Payer: Self-pay | Admitting: Internal Medicine

## 2020-05-01 ENCOUNTER — Inpatient Hospital Stay: Payer: PPO

## 2020-05-01 ENCOUNTER — Inpatient Hospital Stay: Payer: PPO | Attending: Internal Medicine

## 2020-05-01 ENCOUNTER — Other Ambulatory Visit: Payer: Self-pay

## 2020-05-01 DIAGNOSIS — Z170 Estrogen receptor positive status [ER+]: Secondary | ICD-10-CM | POA: Diagnosis not present

## 2020-05-01 DIAGNOSIS — C50511 Malignant neoplasm of lower-outer quadrant of right female breast: Secondary | ICD-10-CM | POA: Insufficient documentation

## 2020-05-01 DIAGNOSIS — Z794 Long term (current) use of insulin: Secondary | ICD-10-CM | POA: Insufficient documentation

## 2020-05-01 DIAGNOSIS — R112 Nausea with vomiting, unspecified: Secondary | ICD-10-CM | POA: Diagnosis not present

## 2020-05-01 DIAGNOSIS — Z9013 Acquired absence of bilateral breasts and nipples: Secondary | ICD-10-CM | POA: Insufficient documentation

## 2020-05-01 DIAGNOSIS — Z79899 Other long term (current) drug therapy: Secondary | ICD-10-CM | POA: Diagnosis not present

## 2020-05-01 DIAGNOSIS — I10 Essential (primary) hypertension: Secondary | ICD-10-CM | POA: Insufficient documentation

## 2020-05-01 DIAGNOSIS — N183 Chronic kidney disease, stage 3 unspecified: Secondary | ICD-10-CM | POA: Diagnosis not present

## 2020-05-01 DIAGNOSIS — C7800 Secondary malignant neoplasm of unspecified lung: Secondary | ICD-10-CM | POA: Diagnosis not present

## 2020-05-01 DIAGNOSIS — B369 Superficial mycosis, unspecified: Secondary | ICD-10-CM | POA: Insufficient documentation

## 2020-05-01 DIAGNOSIS — E119 Type 2 diabetes mellitus without complications: Secondary | ICD-10-CM | POA: Insufficient documentation

## 2020-05-01 LAB — CBC WITH DIFFERENTIAL/PLATELET
Abs Immature Granulocytes: 0.02 10*3/uL (ref 0.00–0.07)
Basophils Absolute: 0 10*3/uL (ref 0.0–0.1)
Basophils Relative: 1 %
Eosinophils Absolute: 0.1 10*3/uL (ref 0.0–0.5)
Eosinophils Relative: 2 %
HCT: 34.9 % — ABNORMAL LOW (ref 36.0–46.0)
Hemoglobin: 12.2 g/dL (ref 12.0–15.0)
Immature Granulocytes: 1 %
Lymphocytes Relative: 45 %
Lymphs Abs: 1.5 10*3/uL (ref 0.7–4.0)
MCH: 33 pg (ref 26.0–34.0)
MCHC: 35 g/dL (ref 30.0–36.0)
MCV: 94.3 fL (ref 80.0–100.0)
Monocytes Absolute: 0.2 10*3/uL (ref 0.1–1.0)
Monocytes Relative: 6 %
Neutro Abs: 1.5 10*3/uL — ABNORMAL LOW (ref 1.7–7.7)
Neutrophils Relative %: 45 %
Platelets: 195 10*3/uL (ref 150–400)
RBC: 3.7 MIL/uL — ABNORMAL LOW (ref 3.87–5.11)
RDW: 12.8 % (ref 11.5–15.5)
WBC: 3.3 10*3/uL — ABNORMAL LOW (ref 4.0–10.5)
nRBC: 0 % (ref 0.0–0.2)

## 2020-05-01 LAB — COMPREHENSIVE METABOLIC PANEL
ALT: 21 U/L (ref 0–44)
AST: 31 U/L (ref 15–41)
Albumin: 3.6 g/dL (ref 3.5–5.0)
Alkaline Phosphatase: 108 U/L (ref 38–126)
Anion gap: 11 (ref 5–15)
BUN: 14 mg/dL (ref 8–23)
CO2: 21 mmol/L — ABNORMAL LOW (ref 22–32)
Calcium: 8.8 mg/dL — ABNORMAL LOW (ref 8.9–10.3)
Chloride: 101 mmol/L (ref 98–111)
Creatinine, Ser: 1.05 mg/dL — ABNORMAL HIGH (ref 0.44–1.00)
GFR calc Af Amer: >60 mL/min (ref >60)
GFR calc non Af Amer: 56 mL/min — ABNORMAL LOW (ref >60)
Glucose, Bld: 271 mg/dL — ABNORMAL HIGH (ref 70–99)
Potassium: 3.9 mmol/L (ref 3.5–5.1)
Sodium: 133 mmol/L — ABNORMAL LOW (ref 135–145)
Total Bilirubin: 0.4 mg/dL (ref 0.3–1.2)
Total Protein: 7 g/dL (ref 6.5–8.1)

## 2020-05-01 MED ORDER — FULVESTRANT 250 MG/5ML IM SOLN
500.0000 mg | Freq: Once | INTRAMUSCULAR | Status: AC
  Administered 2020-05-01: 500 mg via INTRAMUSCULAR
  Filled 2020-05-01: qty 10

## 2020-05-01 NOTE — Assessment & Plan Note (Addendum)
#  Recurrent breast cancer-ER positive PR negative ? HER-2/neu-currently on Faslodex plus abema.  AUG 4th 2021- CT A/P- No evidence of any progressive metastatic disease- STABLE.   # Continue Faslodex; CONTINUE Abema 100 BID; Labs today-white count-3.3; ANC- 1.5     # CKD- stage III- monitor closely-STABLE.  # Fungal dermatitis: poor reposne to topical; s/p terbinafine x 7 days. stable  # COVID BOOSTER: Discussed given patient's diagnosis and other comorbidities/therapies-patient would be considered immunocompromised.  As per CDC recommendation/FDA approval-I would recommend booster vaccine.  Patient is interested.    #DISPOSITION: # Faslodex/today  # Follow up 1 month MD/ labs (CBC/CMP/Ca 27-29) Faslodex; -Dr.B

## 2020-05-01 NOTE — Progress Notes (Signed)
.[facility] OFFICE PROGRESS NOTE  Patient Care Team: Patient, No Pcp Per as PCP - General ([place]) Neldon Mc, MD (General Surgery) Everlene Farrier, MD (Obstetrics and Gynecology) Noreene Filbert, MD Forest Gleason, MD (Inactive) (Unknown Physician Specialty)  Cancer Staging Carcinoma of lower-outer quadrant of right breast in female, estrogen receptor positive [facility]) Staging form: Breast, AJCC 7th Edition - Pathologic: ypT1c,ypN2a, MX - Signed by Haywood Lasso, MD on 03/10/2012    Oncology History Overview Note  # DEC 2012- RIGHT BREAST CA T2 N2 M0 tumor from biopsy.  Estrogen receptor positive, Progesterone receptor positive.  Current receptor negative by FISH 2. Neoadjuvant chemotherapy started in December of 28 with Cytoxan Adriamycin 3. Started on Taxol weekly chemotherapy. 4. Patient finished 12  cycles of Taxol chemotherapy in May of 2013.     5. Status post right modified radical mastectomy [Dr.Bowers; [place]] June of 2013, ypT1c  yp N2  MO. started also on Lerazole    7. Radiation therapy to the right breast (September of 2013).  Lymph node was positive for HER-2 receptor gene amplification of 2.22.  Will proceed with Herceptin treatment starting in September of 2013.   8.Patient has finished Herceptin (maintenance therapy) in August of 2014 8. Start patient on letrozole from November, 2013. 9. Patient started on Herceptin in September 2013.    10Patient finished 12 months of Herceptin therapy on August, 2014  # 6. Status post left side prophylactic mastectomy.  #Late MAY 2018-RECURRENCE BREAST CA- ER positive/PR negative; ?? HER-2/neu- [biopsy- proven-mediastinal lymph node; in suff for her 2 testing].  [elevated Tumor marker- CT/PET- uptake in Right Mediastinal LN; Sternum [June 2018 EBUS- Dr.Kasa]   # March 10 2017- faslodex + Abema; OCT 5th CT-PR of mediastinal LN [consent]  #August 2020-left chest wall nodule biopsy benign  #August  2019- DVT-right upper extremity; Eliquis; stop end of March 2021 [repeat ultrasound negative/patient preference] --------------------------------------------    DIAGNOSIS: [ BREAST CANCER- ER/PR/HER2 NEU POS  STAGE:  4   ;GOALS: PALLIATIVE  CURRENT/MOST RECENT THERAPY - ABEMA + FASLODEX    Carcinoma of lower-outer quadrant of right breast in female, estrogen receptor positive ([place])   INTERVAL HISTORY:  Kimberly [name] 64 y.o.  female pleasant patient above history of metastatic ER PR positive HER-2/neu positive breast cancer currently on abema+ Faslodex is here for follow-up.  States rash in her left abdominal pannus-is improved not resolved.  She is continues topical antifungal powders.  Otherwise denies any new shortness of breath or cough no abdominal pain pain no fevers or chills.  Review of Systems  Constitutional: Positive for malaise/fatigue. Negative for chills, diaphoresis, fever and weight loss.  HENT: Negative for nosebleeds and sore throat.   Eyes: Negative for double vision.  Respiratory: Negative for cough, hemoptysis, sputum production, shortness of breath and wheezing.   Cardiovascular: Negative for chest pain, palpitations, orthopnea and leg swelling.  Gastrointestinal: Positive for diarrhea. Negative for abdominal pain, blood in stool, constipation, heartburn, melena, nausea and vomiting.  Musculoskeletal: Positive for back pain and joint pain.  Skin: Positive for rash.  Neurological: Negative for dizziness, tingling, focal weakness, weakness and headaches.  Endo/Heme/Allergies: Does not bruise/bleed easily.  Psychiatric/Behavioral: Negative for depression. The patient is not nervous/anxious and does not have insomnia.       PAST MEDICAL HISTORY :  Past Medical History:  Diagnosis Date  . Arthritis   . Cancer of lower-outer quadrant of female breast ([place]) 08/06/2011   RIGHT, chemo and bilateral  mastectomies.  . High cholesterol   . History of kidney  stones   . Hx of bilateral breast implants   . Hypertension   . Lung metastases ([place]) [address]   chemo pills and faslidex shots.  Marland Kitchen PONV (postoperative nausea and vomiting)   . Type II diabetes mellitus ([facility])    fasting 140-150  . Vertigo    LAST WEEK  . Vertigo 01/2017    PAST SURGICAL HISTORY :   Past Surgical History:  Procedure Laterality Date  . BREAST BIOPSY  07/2011, 2020   right  . BREAST RECONSTRUCTION  03/07/2012   Procedure: BREAST RECONSTRUCTION;  Surgeon: Crissie Reese, MD;  Location: [facility];  Service: Plastics;  Laterality: Left;  BREAST RECONSTRUCTION WITH PLACEMENT OF TISSUE EXPANDER TO LEFT BREAST  . BREAST RECONSTRUCTION  02/06/2013  . CESAREAN SECTION  [id number]  . ENDOBRONCHIAL ULTRASOUND N/A 02/24/2017   Procedure: ENDOBRONCHIAL ULTRASOUND;  Surgeon: [name], MD;  Location: [facility];  Service: Cardiopulmonary;  Laterality: N/A;  . LATISSIMUS FLAP TO BREAST Right 02/06/2013   Procedure: LATISSIMUS FLAP TO RIGHT BREAST WITH IMPLANT;  Surgeon: Crissie Reese, MD;  Location: [facility];  Service: Plastics;  Laterality: Right;  . MASTECTOMY  03/07/12   modified right; total left  . MODIFIED MASTECTOMY  03/07/2012   Procedure: MODIFIED MASTECTOMY;  Surgeon: Haywood Lasso, MD;  Location: [facility];  Service: General;  Laterality: Right;  . PORTACATH PLACEMENT  07/2011  . SCAR REVISION  03/30/2012   Procedure: SCAR REVISION;  Surgeon: Haywood Lasso, MD;  Location: [facility];  Service: General;  Laterality: Right;  CLOSURE OF MASTECTOMY INCISION  . WISDOM TOOTH EXTRACTION      FAMILY HISTORY :   Family History  Problem Relation Age of Onset  . Breast cancer Mother   . Diabetes Father     SOCIAL HISTORY:   Social History   Tobacco Use  . Smoking status: Never Smoker  . Smokeless tobacco: Never Used  Vaping Use  . Vaping Use: Never used  Substance Use Topics  . Alcohol use: No  . Drug use: No    ALLERGIES:  is allergic to sulfa  antibiotics.  MEDICATIONS:  Current Outpatient Medications  Medication Sig Dispense Refill  . atorvastatin (LIPITOR) 20 MG tablet Take 1 tablet (20 mg total) by mouth daily. 90 tablet 3  . betamethasone valerate (VALISONE) 0.1 % cream Apply 0.1 application topically as needed.    . Calcium Carbonate-Vitamin D (CALCIUM 600 + D PO) Take 1 tablet by mouth 2 (two) times daily.    . fulvestrant (FASLODEX) 250 MG/5ML injection Inject into the muscle every 30 (thirty) days. One injection each buttock over 1-2 minutes. Warm prior to use.    Marland Kitchen glipiZIDE (GLUCOTROL XL) 10 MG 24 hr tablet Take 1 tablet (10 mg total) by mouth 2 (two) times daily. 180 tablet 3  . insulin aspart (NOVOLOG FLEXPEN) 100 UNIT/ML FlexPen Inject 4 Units into the skin 3 (three) times daily with meals. 15 mL 0  . Insulin Detemir (LEVEMIR) 100 UNIT/ML Pen Inject 20 Units into the skin at bedtime. 15 mL 0  . levothyroxine (SYNTHROID) 25 MCG tablet Take 1 tablet (25 mcg total) by mouth daily before breakfast. 90 tablet 1  . lisinopril (ZESTRIL) 10 MG tablet Take 10 mg by mouth daily.    . mupirocin ointment (BACTROBAN) 2 % mupirocin 2 % topical ointment  APPLY 1 APPLICATION TOPICALLY 3 (THREE) TIMES DAILY.    Marland Kitchen  ondansetron (ZOFRAN) 8 MG tablet Take 1 tablet (8 mg total) by mouth every 8 (eight) hours as needed for nausea or vomiting. 20 tablet 3  . ondansetron (ZOFRAN-ODT) 4 MG disintegrating tablet Take 1 tablet (4 mg total) by mouth every 8 (eight) hours as needed for nausea or vomiting. 20 tablet 3  . promethazine (PHENERGAN) 25 MG tablet Take 1 tablet (25 mg total) by mouth every 6 (six) hours as needed for nausea or vomiting. 30 tablet 0  . terbinafine (LAMISIL) 250 MG tablet Take 1 tablet (250 mg total) by mouth daily. 14 tablet 0  . VERZENIO 100 MG tablet TAKE 1 TABLET BY MOUTH TWICE DAILY 56 tablet 0  . fluconazole (DIFLUCAN) 150 MG tablet Take 1 tablet (150 mg total) by mouth daily. Take another tablet in 72 hours if yeast  infection not resolved. (Patient not taking: Reported on 01/31/2020) 2 tablet 0  . loperamide (IMODIUM A-D) 2 MG tablet Take 2 mg by mouth 4 (four) times daily as needed for diarrhea or loose stools. (Patient not taking: Reported on 04/03/2020)     No current facility-administered medications for this visit.    PHYSICAL EXAMINATION: ECOG PERFORMANCE STATUS: 1 - Symptomatic but completely ambulatory  BP 128/76 (BP Location: Left Arm, Patient Position: Sitting, Cuff Size: Normal)   Pulse 88   Temp (!) 97.5 F (36.4 C) (Tympanic)   Resp 16   Ht _0  (1.575 m)   Wt 174 lb (78.9 kg)   SpO2 100%   BMI 31.83 kg/m   Filed Weights   05/01/20 0929  Weight: 174 lb (78.9 kg)    Physical Exam Constitutional:      Comments: She is alone.   HENT:     Head: Normocephalic and atraumatic.     Mouth/Throat:     Pharynx: No oropharyngeal exudate.  Eyes:     Pupils: Pupils are equal, round, and reactive to light.  Cardiovascular:     Rate and Rhythm: Normal rate and regular rhythm.  Pulmonary:     Effort: Pulmonary effort is normal. No respiratory distress.     Breath sounds: Normal breath sounds. No wheezing.  Abdominal:     General: Bowel sounds are normal. There is no distension.     Palpations: Abdomen is soft. There is no mass.     Tenderness: There is no abdominal tenderness. There is no guarding or rebound.  Musculoskeletal:        General: No tenderness. Normal range of motion.     Cervical back: Normal range of motion and neck supple.     Comments: Approximately 1 cm hard nodule felt in the anterior chest wall/left parasternal.  Skin:    General: Skin is warm.     Comments: Maculopapular rash in the left inguinal region/fold.  Neurological:     Mental Status: She is alert and oriented to person, place, and time.  Psychiatric:        Mood and Affect: Affect normal.     LABORATORY DATA:  I have reviewed the data as listed    Component Value Date/Time   NA 133 (L)  05/01/2020 0853   NA 135 11/28/2014 1401   K 3.9 05/01/2020 0853   K 4.7 11/28/2014 1401   CL 101 05/01/2020 0853   CL 99 (L) 11/28/2014 1401   CO2 21 (L) 05/01/2020 0853   CO2 28 11/28/2014 1401   GLUCOSE 271 (H) 05/01/2020 0853   GLUCOSE 215 (H) 11/28/2014 1401  BUN 14 05/01/2020 0853   BUN 16 11/28/2014 1401   CREATININE 1.05 (H) 05/01/2020 0853   CREATININE 0.96 11/28/2014 1401   CALCIUM 8.8 (L) 05/01/2020 0853   CALCIUM 9.9 11/28/2014 1401   PROT 7.0 05/01/2020 0853   PROT 7.6 11/28/2014 1401   ALBUMIN 3.6 05/01/2020 0853   ALBUMIN 4.5 11/28/2014 1401   AST 31 05/01/2020 0853   AST 26 11/28/2014 1401   ALT 21 05/01/2020 0853   ALT 28 11/28/2014 1401   ALKPHOS 108 05/01/2020 0853   ALKPHOS 78 11/28/2014 1401   BILITOT 0.4 05/01/2020 0853   BILITOT 0.5 11/28/2014 1401   GFRNONAA 56 (L) 05/01/2020 0853   GFRNONAA >60 11/28/2014 1401   GFRAA >60 05/01/2020 0853   GFRAA >60 11/28/2014 1401    No results found for: SPEP, UPEP  Lab Results  Component Value Date   WBC 3.3 (L) 05/01/2020   NEUTROABS 1.5 (L) 05/01/2020   HGB 12.2 05/01/2020   HCT 34.9 (L) 05/01/2020   MCV 94.3 05/01/2020   PLT 195 05/01/2020      Chemistry      Component Value Date/Time   NA 133 (L) 05/01/2020 0853   NA 135 11/28/2014 1401   K 3.9 05/01/2020 0853   K 4.7 11/28/2014 1401   CL 101 05/01/2020 0853   CL 99 (L) 11/28/2014 1401   CO2 21 (L) 05/01/2020 0853   CO2 28 11/28/2014 1401   BUN 14 05/01/2020 0853   BUN 16 11/28/2014 1401   CREATININE 1.05 (H) 05/01/2020 0853   CREATININE 0.96 11/28/2014 1401      Component Value Date/Time   CALCIUM 8.8 (L) 05/01/2020 0853   CALCIUM 9.9 11/28/2014 1401   ALKPHOS 108 05/01/2020 0853   ALKPHOS 78 11/28/2014 1401   AST 31 05/01/2020 0853   AST 26 11/28/2014 1401   ALT 21 05/01/2020 0853   ALT 28 11/28/2014 1401   BILITOT 0.4 05/01/2020 0853   BILITOT 0.5 11/28/2014 1401       RADIOGRAPHIC STUDIES: I have personally reviewed the  radiological images as listed and agreed with the findings in the report. No results found.   ASSESSMENT & PLAN:  Carcinoma of lower-outer quadrant of right breast in female, estrogen receptor positive ([place])  # Recurrent breast cancer-ER positive PR negative ? HER-2/neu-currently on Faslodex plus abema.  AUG 4th 2021- CT A/P- No evidence of any progressive metastatic disease- STABLE.   # Continue Faslodex; CONTINUE Abema 100 BID; Labs today-white count-3.3; ANC- 1.5     # CKD- stage III- monitor closely-STABLE.  # Fungal dermatitis: poor reposne to topical; s/p terbinafine x 7 days. stable  # COVID BOOSTER: Discussed given patient's diagnosis and other comorbidities/therapies-patient would be considered immunocompromised.  As per CDC recommendation/FDA approval-I would recommend booster vaccine.  Patient is interested.    #DISPOSITION: # Faslodex/today  # Follow up 1 month MD/ labs (CBC/CMP/Ca 27-29) Faslodex; -Dr.B       No orders of the defined types were placed in this encounter.  All questions were answered. The patient knows to call the clinic with any problems, questions or concerns.      [name], MD 05/01/2020 1:38 PM

## 2020-05-02 LAB — CANCER ANTIGEN 27.29: CA 27.29: 21.9 U/mL (ref 0.0–38.6)

## 2020-05-05 ENCOUNTER — Other Ambulatory Visit: Payer: Self-pay | Admitting: Internal Medicine

## 2020-05-05 DIAGNOSIS — C50511 Malignant neoplasm of lower-outer quadrant of right female breast: Secondary | ICD-10-CM

## 2020-05-23 ENCOUNTER — Inpatient Hospital Stay (HOSPITAL_BASED_OUTPATIENT_CLINIC_OR_DEPARTMENT_OTHER): Payer: PPO | Admitting: Nurse Practitioner

## 2020-05-23 ENCOUNTER — Other Ambulatory Visit: Payer: Self-pay

## 2020-05-23 ENCOUNTER — Inpatient Hospital Stay: Payer: PPO

## 2020-05-23 ENCOUNTER — Telehealth: Payer: Self-pay | Admitting: *Deleted

## 2020-05-23 ENCOUNTER — Encounter: Payer: Self-pay | Admitting: Nurse Practitioner

## 2020-05-23 VITALS — BP 174/90 | HR 93 | Resp 20

## 2020-05-23 VITALS — BP 129/66 | HR 100 | Temp 98.9°F | Resp 18

## 2020-05-23 DIAGNOSIS — R7989 Other specified abnormal findings of blood chemistry: Secondary | ICD-10-CM | POA: Diagnosis not present

## 2020-05-23 DIAGNOSIS — C50511 Malignant neoplasm of lower-outer quadrant of right female breast: Secondary | ICD-10-CM | POA: Diagnosis not present

## 2020-05-23 DIAGNOSIS — R112 Nausea with vomiting, unspecified: Secondary | ICD-10-CM

## 2020-05-23 DIAGNOSIS — Z170 Estrogen receptor positive status [ER+]: Secondary | ICD-10-CM

## 2020-05-23 LAB — CBC WITH DIFFERENTIAL/PLATELET
Abs Immature Granulocytes: 0.01 10*3/uL (ref 0.00–0.07)
Basophils Absolute: 0.1 10*3/uL (ref 0.0–0.1)
Basophils Relative: 1 %
Eosinophils Absolute: 0 10*3/uL (ref 0.0–0.5)
Eosinophils Relative: 0 %
HCT: 36.7 % (ref 36.0–46.0)
Hemoglobin: 13.2 g/dL (ref 12.0–15.0)
Immature Granulocytes: 0 %
Lymphocytes Relative: 22 %
Lymphs Abs: 1.2 10*3/uL (ref 0.7–4.0)
MCH: 33 pg (ref 26.0–34.0)
MCHC: 36 g/dL (ref 30.0–36.0)
MCV: 91.8 fL (ref 80.0–100.0)
Monocytes Absolute: 0.2 10*3/uL (ref 0.1–1.0)
Monocytes Relative: 4 %
Neutro Abs: 3.8 10*3/uL (ref 1.7–7.7)
Neutrophils Relative %: 73 %
Platelets: 273 10*3/uL (ref 150–400)
RBC: 4 MIL/uL (ref 3.87–5.11)
RDW: 12.9 % (ref 11.5–15.5)
WBC: 5.3 10*3/uL (ref 4.0–10.5)
nRBC: 0 % (ref 0.0–0.2)

## 2020-05-23 LAB — COMPREHENSIVE METABOLIC PANEL
ALT: 94 U/L — ABNORMAL HIGH (ref 0–44)
AST: 215 U/L — ABNORMAL HIGH (ref 15–41)
Albumin: 3.8 g/dL (ref 3.5–5.0)
Alkaline Phosphatase: 113 U/L (ref 38–126)
Anion gap: 15 (ref 5–15)
BUN: 15 mg/dL (ref 8–23)
CO2: 24 mmol/L (ref 22–32)
Calcium: 9.4 mg/dL (ref 8.9–10.3)
Chloride: 96 mmol/L — ABNORMAL LOW (ref 98–111)
Creatinine, Ser: 1.05 mg/dL — ABNORMAL HIGH (ref 0.44–1.00)
GFR calc Af Amer: >60 mL/min (ref >60)
GFR calc non Af Amer: 56 mL/min — ABNORMAL LOW (ref >60)
Glucose, Bld: 373 mg/dL — ABNORMAL HIGH (ref 70–99)
Potassium: 3.9 mmol/L (ref 3.5–5.1)
Sodium: 135 mmol/L (ref 135–145)
Total Bilirubin: 2.3 mg/dL — ABNORMAL HIGH (ref 0.3–1.2)
Total Protein: 8 g/dL (ref 6.5–8.1)

## 2020-05-23 LAB — AMYLASE: Amylase: 75 U/L (ref 28–100)

## 2020-05-23 LAB — LIPASE, BLOOD: Lipase: 37 U/L (ref 11–51)

## 2020-05-23 MED ORDER — HEPARIN SOD (PORK) LOCK FLUSH 100 UNIT/ML IV SOLN
500.0000 [IU] | Freq: Once | INTRAVENOUS | Status: DC
  Filled 2020-05-23: qty 5

## 2020-05-23 MED ORDER — SODIUM CHLORIDE 0.9 % IV SOLN
Freq: Once | INTRAVENOUS | Status: AC
  Filled 2020-05-23: qty 250

## 2020-05-23 MED ORDER — SODIUM CHLORIDE 0.9% FLUSH
10.0000 mL | INTRAVENOUS | Status: DC | PRN
  Filled 2020-05-23: qty 10

## 2020-05-23 MED ORDER — PROCHLORPERAZINE EDISYLATE 10 MG/2ML IJ SOLN
10.0000 mg | Freq: Once | INTRAMUSCULAR | Status: AC
  Administered 2020-05-23: 10 mg via INTRAVENOUS
  Filled 2020-05-23: qty 2

## 2020-05-23 MED ORDER — PROMETHAZINE HCL 25 MG PO TABS: 25.0000 mg | ORAL_TABLET | Freq: Four times a day (QID) | ORAL | 0 refills | Status: DC | PRN

## 2020-05-23 MED ORDER — PROCHLORPERAZINE MALEATE 10 MG PO TABS: 10.0000 mg | ORAL_TABLET | Freq: Four times a day (QID) | ORAL | 0 refills | Status: DC | PRN

## 2020-05-23 MED ORDER — ONDANSETRON HCL 4 MG/2ML IJ SOLN
8.0000 mg | Freq: Once | INTRAMUSCULAR | Status: AC
  Administered 2020-05-23: 8 mg via INTRAVENOUS

## 2020-05-23 NOTE — Progress Notes (Signed)
[facility] [place]  [phone] [phone]  Patient Care Team: Patient, No Pcp Per as PCP - General ([place]) Neldon Mc, MD (General Surgery) Everlene Farrier, MD (Obstetrics and Gynecology) Noreene Filbert, MD Forest Gleason, MD (Inactive) (Unknown Physician Specialty)   Name of the patient: Kimberly [name]  [id number]  11/16/1955   Date of visit: 05/23/20  Diagnosis- Breast Cancer  Chief complaint/ Reason for visit-nausea and vomiting  Heme/Onc history:  Oncology History Overview Note  # DEC 2012- RIGHT BREAST CA T2 N2 M0 tumor from biopsy.  Estrogen receptor positive, Progesterone receptor positive.  Current receptor negative by FISH 2. Neoadjuvant chemotherapy started in December of 28 with Cytoxan Adriamycin 3. Started on Taxol weekly chemotherapy. 4. Patient finished 12  cycles of Taxol chemotherapy in May of 2013.     5. Status post right modified radical mastectomy [Dr.Bowers; [place]] June of 2013, ypT1c  yp N2  MO. started also on Lerazole    7. Radiation therapy to the right breast (September of 2013).  Lymph node was positive for HER-2 receptor gene amplification of 2.22.  Will proceed with Herceptin treatment starting in September of 2013.   8.Patient has finished Herceptin (maintenance therapy) in August of 2014 8. Start patient on letrozole from November, 2013. 9. Patient started on Herceptin in September 2013.    10Patient finished 12 months of Herceptin therapy on August, 2014  # 6. Status post left side prophylactic mastectomy.  #Late MAY 2018-RECURRENCE BREAST CA- ER positive/PR negative; ?? HER-2/neu- [biopsy- proven-mediastinal lymph node; in suff for her 2 testing].  [elevated Tumor marker- CT/PET- uptake in Right Mediastinal LN; Sternum [June 2018 EBUS- Dr.Kasa]   # March 10 2017- faslodex + Abema; OCT 5th CT-PR of mediastinal LN [consent]  #August 2020-left chest wall nodule  biopsy benign  #August 2019- DVT-right upper extremity; Eliquis; stop end of March 2021 [repeat ultrasound negative/patient preference] --------------------------------------------    DIAGNOSIS: [ BREAST CANCER- ER/PR/HER2 NEU POS  STAGE:  4   ;GOALS: PALLIATIVE  CURRENT/MOST RECENT THERAPY - ABEMA + FASLODEX    Carcinoma of lower-outer quadrant of right breast in female, estrogen receptor positive ([place])    Interval history- Kimberly [name], 64 year old female patient with above history of stage IV breast cancer currently receiving palliative abema and faslodex [facility] for complaints of nausea and vomiting. She has pain all over from vomiting. Unable to tolerate food or fluids. Started early this morning (around 4 am), she was woken from sleep with urge to vomit. Describes emesis as yellow, thin liquid. Now dry heaves. Tried oral medications but was unable to keep them down. No fevers or chills. No diarrhea. No abdominal pain. Ate out earlier in the day. Denies other sick contacts.   Review of systems- Review of Systems  Constitutional: Positive for malaise/fatigue. Negative for chills, fever and weight loss.  HENT: Negative for hearing loss, nosebleeds, sore throat and tinnitus.   Eyes: Negative for blurred vision and double vision.  Respiratory: Negative for cough, hemoptysis, shortness of breath and wheezing.   Cardiovascular: Negative for chest pain, palpitations and leg swelling.  Gastrointestinal: Positive for nausea and vomiting. Negative for abdominal pain, blood in stool, constipation, diarrhea and melena.  Genitourinary: Negative for dysuria and urgency.  Musculoskeletal: Negative for back pain, falls, joint pain and myalgias.  Skin: Negative for itching and rash.  Neurological: Negative for dizziness, tingling, sensory change, loss of consciousness, weakness and headaches.  Endo/Heme/Allergies:  Negative for environmental allergies. Does not  bruise/bleed easily.  Psychiatric/Behavioral: Negative for depression. The patient is not nervous/anxious and does not have insomnia.      Allergies  Allergen Reactions  . Sulfa Antibiotics Rash    Past Medical History:  Diagnosis Date  . Arthritis   . Cancer of lower-outer quadrant of female breast ([place]) 08/06/2011   RIGHT, chemo and bilateral mastectomies.  . High cholesterol   . History of kidney stones   . Hx of bilateral breast implants   . Hypertension   . Lung metastases ([place]) [address]   chemo pills and faslidex shots.  Marland Kitchen PONV (postoperative nausea and vomiting)   . Type II diabetes mellitus ([facility])    fasting 140-150  . Vertigo    LAST WEEK  . Vertigo 01/2017    Past Surgical History:  Procedure Laterality Date  . BREAST BIOPSY  07/2011, 2020   right  . BREAST RECONSTRUCTION  03/07/2012   Procedure: BREAST RECONSTRUCTION;  Surgeon: Crissie Reese, MD;  Location: [facility];  Service: Plastics;  Laterality: Left;  BREAST RECONSTRUCTION WITH PLACEMENT OF TISSUE EXPANDER TO LEFT BREAST  . BREAST RECONSTRUCTION  02/06/2013  . CESAREAN SECTION  [id number]  . ENDOBRONCHIAL ULTRASOUND N/A 02/24/2017   Procedure: ENDOBRONCHIAL ULTRASOUND;  Surgeon: [name], MD;  Location: [facility];  Service: Cardiopulmonary;  Laterality: N/A;  . LATISSIMUS FLAP TO BREAST Right 02/06/2013   Procedure: LATISSIMUS FLAP TO RIGHT BREAST WITH IMPLANT;  Surgeon: Crissie Reese, MD;  Location: [facility];  Service: Plastics;  Laterality: Right;  . MASTECTOMY  03/07/12   modified right; total left  . MODIFIED MASTECTOMY  03/07/2012   Procedure: MODIFIED MASTECTOMY;  Surgeon: Haywood Lasso, MD;  Location: [facility];  Service: General;  Laterality: Right;  . PORTACATH PLACEMENT  07/2011  . SCAR REVISION  03/30/2012   Procedure: SCAR REVISION;  Surgeon: Haywood Lasso, MD;  Location: [facility];  Service: General;  Laterality: Right;  CLOSURE OF MASTECTOMY INCISION  . WISDOM TOOTH EXTRACTION       Social History   Socioeconomic History  . Marital status: Married    Spouse name: Not on file  . Number of children: Not on file  . Years of education: Not on file  . Highest education level: Not on file  Occupational History  . Not on file  Tobacco Use  . Smoking status: Never Smoker  . Smokeless tobacco: Never Used  Vaping Use  . Vaping Use: Never used  Substance and Sexual Activity  . Alcohol use: No  . Drug use: No  . Sexual activity: Yes  Other Topics Concern  . Not on file  Social History Narrative  . Not on file   Social Determinants of Health   Financial Resource Strain:   . Difficulty of Paying Living Expenses: Not on file  Food Insecurity:   . Worried About Charity fundraiser in the Last Year: Not on file  . Ran Out of Food in the Last Year: Not on file  Transportation Needs:   . Lack of Transportation (Medical): Not on file  . Lack of Transportation (Non-Medical): Not on file  Physical Activity:   . Days of Exercise per Week: Not on file  . Minutes of Exercise per Session: Not on file  Stress:   . Feeling of Stress : Not on file  Social Connections:   . Frequency of Communication with Friends and Family: Not on file  . Frequency of  Social Gatherings with Friends and Family: Not on file  . Attends Religious Services: Not on file  . Active Member of Clubs or Organizations: Not on file  . Attends Archivist Meetings: Not on file  . Marital Status: Not on file  Intimate Partner Violence:   . Fear of Current or Ex-Partner: Not on file  . Emotionally Abused: Not on file  . Physically Abused: Not on file  . Sexually Abused: Not on file    Family History  Problem Relation Age of Onset  . Breast cancer Mother   . Diabetes Father      Current Outpatient Medications:  .  atorvastatin (LIPITOR) 20 MG tablet, Take 1 tablet (20 mg total) by mouth daily., Disp: 90 tablet, Rfl: 3 .  betamethasone valerate (VALISONE) 0.1 % cream, Apply 0.1  application topically as needed., Disp: , Rfl:  .  Calcium Carbonate-Vitamin D (CALCIUM 600 + D PO), Take 1 tablet by mouth 2 (two) times daily., Disp: , Rfl:  .  fluconazole (DIFLUCAN) 150 MG tablet, Take 1 tablet (150 mg total) by mouth daily. Take another tablet in 72 hours if yeast infection not resolved. (Patient not taking: Reported on 01/31/2020), Disp: 2 tablet, Rfl: 0 .  fulvestrant (FASLODEX) 250 MG/5ML injection, Inject into the muscle every 30 (thirty) days. One injection each buttock over 1-2 minutes. Warm prior to use., Disp: , Rfl:  .  glipiZIDE (GLUCOTROL XL) 10 MG 24 hr tablet, Take 1 tablet (10 mg total) by mouth 2 (two) times daily., Disp: 180 tablet, Rfl: 3 .  insulin aspart (NOVOLOG FLEXPEN) 100 UNIT/ML FlexPen, Inject 4 Units into the skin 3 (three) times daily with meals., Disp: 15 mL, Rfl: 0 .  Insulin Detemir (LEVEMIR) 100 UNIT/ML Pen, Inject 20 Units into the skin at bedtime., Disp: 15 mL, Rfl: 0 .  levothyroxine (SYNTHROID) 25 MCG tablet, Take 1 tablet (25 mcg total) by mouth daily before breakfast., Disp: 90 tablet, Rfl: 1 .  lisinopril (ZESTRIL) 10 MG tablet, Take 10 mg by mouth daily., Disp: , Rfl:  .  loperamide (IMODIUM A-D) 2 MG tablet, Take 2 mg by mouth 4 (four) times daily as needed for diarrhea or loose stools. (Patient not taking: Reported on 04/03/2020), Disp: , Rfl:  .  mupirocin ointment (BACTROBAN) 2 %, mupirocin 2 % topical ointment  APPLY 1 APPLICATION TOPICALLY 3 (THREE) TIMES DAILY., Disp: , Rfl:  .  ondansetron (ZOFRAN) 8 MG tablet, Take 1 tablet (8 mg total) by mouth every 8 (eight) hours as needed for nausea or vomiting., Disp: 20 tablet, Rfl: 3 .  ondansetron (ZOFRAN-ODT) 4 MG disintegrating tablet, Take 1 tablet (4 mg total) by mouth every 8 (eight) hours as needed for nausea or vomiting., Disp: 20 tablet, Rfl: 3 .  promethazine (PHENERGAN) 25 MG tablet, Take 1 tablet (25 mg total) by mouth every 6 (six) hours as needed for nausea or vomiting., Disp: 30  tablet, Rfl: 0 .  terbinafine (LAMISIL) 250 MG tablet, Take 1 tablet (250 mg total) by mouth daily., Disp: 14 tablet, Rfl: 0 .  VERZENIO 100 MG tablet, TAKE 1 TABLET BY MOUTH TWICE DAILY, Disp: 60 tablet, Rfl: 3 No current facility-administered medications for this visit.  Facility-Administered Medications Ordered in Other Visits:  .  0.9 %  sodium chloride infusion, , Intravenous, Once, Verlon Au, NP, Last Rate: 999 mL/hr at 05/23/20 1314, New Bag at 05/23/20 1314 .  heparin lock flush 100 unit/mL, 500 Units, Intravenous, Once, Zenia Resides,  Beverely Risen, NP .  sodium chloride flush (NS) 0.9 % injection 10 mL, 10 mL, Intravenous, PRN, Verlon Au, NP  Physical exam:  Vitals:   05/23/20 1337  BP: (!) 174/90  Pulse: 93  Resp: 20  SpO2: 100%   Physical Exam Constitutional:      Appearance: She is ill-appearing.  HENT:     Head: Normocephalic and atraumatic.  Eyes:     General: No scleral icterus.    Conjunctiva/sclera: Conjunctivae normal.  Cardiovascular:     Rate and Rhythm: Normal rate and regular rhythm.  Pulmonary:     Effort: Pulmonary effort is normal.     Breath sounds: Normal breath sounds.  Abdominal:     General: There is no distension.     Tenderness: There is no abdominal tenderness. There is no right CVA tenderness, left CVA tenderness, guarding or rebound.     Comments: Dry heaves; holding emesis bag.   Musculoskeletal:     Cervical back: Neck supple. No rigidity.  Skin:    General: Skin is warm and dry.     Coloration: Skin is not jaundiced or pale.  Neurological:     General: No focal deficit present.     Mental Status: She is alert and oriented to person, place, and time.  Psychiatric:        Mood and Affect: Mood normal.        Behavior: Behavior normal.      CMP Latest Ref Rng & Units 05/23/2020  Glucose 70 - 99 mg/dL 373(H)  BUN 8 - 23 mg/dL 15  Creatinine 0.44 - 1.00 mg/dL 1.05(H)  Sodium 135 - 145 mmol/L 135  Potassium 3.5 - 5.1 mmol/L 3.9    Chloride 98 - 111 mmol/L 96(L)  CO2 22 - 32 mmol/L 24  Calcium 8.9 - 10.3 mg/dL 9.4  Total Protein 6.5 - 8.1 g/dL 8.0  Total Bilirubin 0.3 - 1.2 mg/dL 2.3(H)  Alkaline Phos 38 - 126 U/L 113  AST 15 - 41 U/L 215(H)  ALT 0 - 44 U/L 94(H)   CBC Latest Ref Rng & Units 05/23/2020  WBC 4.0 - 10.5 K/uL 5.3  Hemoglobin 12.0 - 15.0 g/dL 13.2  Hematocrit 36 - 46 % 36.7  Platelets 150 - 400 K/uL 273    No images are attached to the encounter.  No results found.  Assessment and plan- Patient is a 64 y.o. female diagnosed with breast cancer currently receiving abema and faslodex who [facility] for intractable nausea and vomiting.   1. Viral illness/Intractable vomiting- etiology unclear but question food poisoning vs viral illness. IV fluids in clinic 1500 cc of NaCl, antiemetics including zofran and compazine with improvement in symptoms. Labs overall stable. Bilirubin and LFTs elevated from baseline. Plan to recheck after acute illness resolves. Will send prescription for phenergan. Encouraged hydration, small sips.   Patient advised to notify the clinic if there is no improvement in symptoms or if symptoms worsen in next 24 hours. Otherwise follow up with Dr. Rogue Bussing as scheduled.  Will do phone follow up with patient in 3 days. If ongoing fluid losses, consider additional IV hydration.   Case discussed with Dr. Rogue Bussing who agreed with and contributed to plan of care.    Visit Diagnosis 1. Intractable vomiting with nausea   2. Elevated LFTs     Patient expressed understanding and was in agreement with this plan. She also understands that She can call clinic at any time with  any questions, concerns, or complaints.   Thank you for allowing me to participate in the care of this very pleasant patient.   Beckey Rutter, DNP, AGNP-C [facility]

## 2020-05-23 NOTE — Telephone Encounter (Signed)
Call from Ms Henrene Pastor reporting that patient is vomiting uncontrollably and nothing is stopping it. She reports that patient chest is hurting from all the vomiting. She cannot keep anything down. Please advise

## 2020-05-23 NOTE — Telephone Encounter (Signed)
Spoke to [facility]. Pt is vomiting and dry heaving since 4am. Not able to keep nausea medication down. Made appt for 1pm for smc today.

## 2020-05-23 NOTE — Progress Notes (Signed)
Pt began vomiting at 4 am. Tried her oral nausea med but unable to hold it down. Pt is anxious and moaning. Peripheral IV started and labs obtained from IV catheter. Pt was given IV zofran and  IV compazine along with 1500 ml of NS. Pt was feeling some better at time of discharge. NP is sending prescription for phenergan into her pharmacy.

## 2020-05-26 ENCOUNTER — Telehealth: Payer: Self-pay | Admitting: Nurse Practitioner

## 2020-05-26 NOTE — Telephone Encounter (Signed)
Spoke to patient. Symptoms improved over the weekend and she feels she has nearly returned to normal.

## 2020-05-29 ENCOUNTER — Ambulatory Visit: Payer: PPO

## 2020-05-29 ENCOUNTER — Ambulatory Visit: Payer: PPO | Admitting: Internal Medicine

## 2020-05-29 ENCOUNTER — Other Ambulatory Visit: Payer: PPO

## 2020-05-30 ENCOUNTER — Inpatient Hospital Stay (HOSPITAL_BASED_OUTPATIENT_CLINIC_OR_DEPARTMENT_OTHER): Payer: PPO | Admitting: Internal Medicine

## 2020-05-30 ENCOUNTER — Other Ambulatory Visit: Payer: Self-pay

## 2020-05-30 ENCOUNTER — Other Ambulatory Visit: Payer: Self-pay | Admitting: Internal Medicine

## 2020-05-30 ENCOUNTER — Inpatient Hospital Stay: Payer: PPO | Attending: Internal Medicine

## 2020-05-30 ENCOUNTER — Inpatient Hospital Stay: Payer: PPO

## 2020-05-30 DIAGNOSIS — E7800 Pure hypercholesterolemia, unspecified: Secondary | ICD-10-CM | POA: Insufficient documentation

## 2020-05-30 DIAGNOSIS — Z803 Family history of malignant neoplasm of breast: Secondary | ICD-10-CM | POA: Diagnosis not present

## 2020-05-30 DIAGNOSIS — Z9013 Acquired absence of bilateral breasts and nipples: Secondary | ICD-10-CM | POA: Diagnosis not present

## 2020-05-30 DIAGNOSIS — I10 Essential (primary) hypertension: Secondary | ICD-10-CM | POA: Insufficient documentation

## 2020-05-30 DIAGNOSIS — Z833 Family history of diabetes mellitus: Secondary | ICD-10-CM | POA: Insufficient documentation

## 2020-05-30 DIAGNOSIS — C50511 Malignant neoplasm of lower-outer quadrant of right female breast: Secondary | ICD-10-CM | POA: Insufficient documentation

## 2020-05-30 DIAGNOSIS — M1990 Unspecified osteoarthritis, unspecified site: Secondary | ICD-10-CM | POA: Diagnosis not present

## 2020-05-30 DIAGNOSIS — Z79899 Other long term (current) drug therapy: Secondary | ICD-10-CM | POA: Insufficient documentation

## 2020-05-30 DIAGNOSIS — Z5111 Encounter for antineoplastic chemotherapy: Secondary | ICD-10-CM | POA: Insufficient documentation

## 2020-05-30 DIAGNOSIS — Z170 Estrogen receptor positive status [ER+]: Secondary | ICD-10-CM | POA: Diagnosis not present

## 2020-05-30 DIAGNOSIS — R300 Dysuria: Secondary | ICD-10-CM

## 2020-05-30 DIAGNOSIS — E119 Type 2 diabetes mellitus without complications: Secondary | ICD-10-CM | POA: Diagnosis not present

## 2020-05-30 DIAGNOSIS — Z794 Long term (current) use of insulin: Secondary | ICD-10-CM | POA: Diagnosis not present

## 2020-05-30 DIAGNOSIS — Z23 Encounter for immunization: Secondary | ICD-10-CM | POA: Insufficient documentation

## 2020-05-30 DIAGNOSIS — N183 Chronic kidney disease, stage 3 unspecified: Secondary | ICD-10-CM | POA: Diagnosis not present

## 2020-05-30 LAB — COMPREHENSIVE METABOLIC PANEL
ALT: 51 U/L — ABNORMAL HIGH (ref 0–44)
AST: 43 U/L — ABNORMAL HIGH (ref 15–41)
Albumin: 3.5 g/dL (ref 3.5–5.0)
Alkaline Phosphatase: 137 U/L — ABNORMAL HIGH (ref 38–126)
Anion gap: 12 (ref 5–15)
BUN: 15 mg/dL (ref 8–23)
CO2: 23 mmol/L (ref 22–32)
Calcium: 9.5 mg/dL (ref 8.9–10.3)
Chloride: 97 mmol/L — ABNORMAL LOW (ref 98–111)
Creatinine, Ser: 1.06 mg/dL — ABNORMAL HIGH (ref 0.44–1.00)
GFR calc Af Amer: >60 mL/min (ref >60)
GFR calc non Af Amer: 55 mL/min — ABNORMAL LOW (ref >60)
Glucose, Bld: 329 mg/dL — ABNORMAL HIGH (ref 70–99)
Potassium: 4 mmol/L (ref 3.5–5.1)
Sodium: 132 mmol/L — ABNORMAL LOW (ref 135–145)
Total Bilirubin: 0.8 mg/dL (ref 0.3–1.2)
Total Protein: 7 g/dL (ref 6.5–8.1)

## 2020-05-30 LAB — CBC WITH DIFFERENTIAL/PLATELET
Abs Immature Granulocytes: 0.02 10*3/uL (ref 0.00–0.07)
Basophils Absolute: 0.1 10*3/uL (ref 0.0–0.1)
Basophils Relative: 2 %
Eosinophils Absolute: 0.1 10*3/uL (ref 0.0–0.5)
Eosinophils Relative: 3 %
HCT: 33.1 % — ABNORMAL LOW (ref 36.0–46.0)
Hemoglobin: 11.8 g/dL — ABNORMAL LOW (ref 12.0–15.0)
Immature Granulocytes: 1 %
Lymphocytes Relative: 36 %
Lymphs Abs: 1.3 10*3/uL (ref 0.7–4.0)
MCH: 33.1 pg (ref 26.0–34.0)
MCHC: 35.6 g/dL (ref 30.0–36.0)
MCV: 93 fL (ref 80.0–100.0)
Monocytes Absolute: 0.3 10*3/uL (ref 0.1–1.0)
Monocytes Relative: 7 %
Neutro Abs: 1.9 10*3/uL (ref 1.7–7.7)
Neutrophils Relative %: 51 %
Platelets: 243 10*3/uL (ref 150–400)
RBC: 3.56 MIL/uL — ABNORMAL LOW (ref 3.87–5.11)
RDW: 13.2 % (ref 11.5–15.5)
WBC: 3.6 10*3/uL — ABNORMAL LOW (ref 4.0–10.5)
nRBC: 0 % (ref 0.0–0.2)

## 2020-05-30 LAB — URINALYSIS, COMPLETE (UACMP) WITH MICROSCOPIC
Bacteria, UA: NONE SEEN
Bilirubin Urine: NEGATIVE
Glucose, UA: >=500 mg/dL — AB
Ketones, ur: NEGATIVE mg/dL
Nitrite: NEGATIVE
Protein, ur: NEGATIVE mg/dL
Specific Gravity, Urine: 1.029 (ref 1.005–1.030)
pH: 5 (ref 5.0–8.0)

## 2020-05-30 MED ORDER — FULVESTRANT 250 MG/5ML IM SOLN
500.0000 mg | Freq: Once | INTRAMUSCULAR | Status: AC
  Administered 2020-05-30: 500 mg via INTRAMUSCULAR
  Filled 2020-05-30: qty 10

## 2020-05-30 MED ORDER — INFLUENZA VAC A&B SA ADJ QUAD 0.5 ML IM PRSY
0.5000 mL | PREFILLED_SYRINGE | Freq: Once | INTRAMUSCULAR | Status: AC
  Administered 2020-05-30: 0.5 mL via INTRAMUSCULAR
  Filled 2020-05-30: qty 0.5

## 2020-05-30 MED ORDER — CIPROFLOXACIN HCL 500 MG PO TABS: 500.0000 mg | ORAL_TABLET | Freq: Two times a day (BID) | ORAL | 0 refills | Status: DC

## 2020-05-30 NOTE — Assessment & Plan Note (Addendum)
#  Recurrent breast cancer-ER positive PR negative ? HER-2/neu-currently on Faslodex plus abema.  AUG 4th 2021- CT A/P- No evidence of any progressive metastatic disease- STABLE   # Continue Faslodex; CONTINUE Abema 100 BID; Labs today-white count-3.6; ANC- 1.9  #Recent nausea vomiting diarrhea/elevated LFTs-question visit to food poisoning/viral infection.  Currently resolved.  AST ALT improving.  Normal bilirubin.  #Increased frequency of urination/mild dysuria- check UA/culture today.    # CKD- stage III- monitor closely- STABLE  # Flu shot today; COVID BOOSTER: Discussed given patient's diagnosis and other comorbidities/therapies-patient would be considered immunocompromised.  As per CDC recommendation/FDA approval-I would recommend booster vaccine.  Patient is interested.    #DISPOSITION: # Faslodex/today  # Follow up 1 month MD/ labs (CBC/CMP/Ca 27-29) Faslodex; -Dr.B

## 2020-05-30 NOTE — Progress Notes (Signed)
.[place] OFFICE PROGRESS NOTE  Patient Care Team: Patient, No Pcp Per as PCP - General ([place]) Neldon Mc, MD (General Surgery) Everlene Farrier, MD (Obstetrics and Gynecology) Noreene Filbert, MD Forest Gleason, MD (Inactive) (Unknown Physician Specialty)  Cancer Staging Carcinoma of lower-outer quadrant of right breast in female, estrogen receptor positive [facility]) Staging form: Breast, AJCC 7th Edition - Pathologic: ypT1c,ypN2a, MX - Signed by Haywood Lasso, MD on 03/10/2012    Oncology History Overview Note  # DEC 2012- RIGHT BREAST CA T2 N2 M0 tumor from biopsy.  Estrogen receptor positive, Progesterone receptor positive.  Current receptor negative by FISH 2. Neoadjuvant chemotherapy started in December of 28 with Cytoxan Adriamycin 3. Started on Taxol weekly chemotherapy. 4. Patient finished 12  cycles of Taxol chemotherapy in May of 2013.     5. Status post right modified radical mastectomy [Dr.Bowers; [place]] June of 2013, ypT1c  yp N2  MO. started also on Lerazole    7. Radiation therapy to the right breast (September of 2013).  Lymph node was positive for HER-2 receptor gene amplification of 2.22.  Will proceed with Herceptin treatment starting in September of 2013.   8.Patient has finished Herceptin (maintenance therapy) in August of 2014 8. Start patient on letrozole from November, 2013. 9. Patient started on Herceptin in September 2013.    10Patient finished 12 months of Herceptin therapy on August, 2014  # 6. Status post left side prophylactic mastectomy.  #Late MAY 2018-RECURRENCE BREAST CA- ER positive/PR negative; ?? HER-2/neu- [biopsy- proven-mediastinal lymph node; in suff for her 2 testing].  [elevated Tumor marker- CT/PET- uptake in Right Mediastinal LN; Sternum [June 2018 EBUS- Dr.Kasa]   # March 10 2017- faslodex + Abema; OCT 5th CT-PR of mediastinal LN [consent]  #August 2020-left chest wall nodule biopsy benign  #August  2019- DVT-right upper extremity; Eliquis; stop end of March 2021 [repeat ultrasound negative/patient preference] --------------------------------------------    DIAGNOSIS: [ BREAST CANCER- ER/PR/HER2 NEU POS  STAGE:  4   ;GOALS: PALLIATIVE  CURRENT/MOST RECENT THERAPY - ABEMA + FASLODEX    Carcinoma of lower-outer quadrant of right breast in female, estrogen receptor positive ([place])   INTERVAL HISTORY:  Kimberly [name] 64 y.o.  female pleasant patient above history of metastatic ER PR positive HER-2/neu positive breast cancer currently on abema+ Faslodex is here for follow-up.  Patient was seen in the symptom management clinic-nausea vomiting diarrhea needing IV fluids for 2 consecutive days.  She thinks it was related to food that she ate.  Currently symptoms of nausea vomiting diarrhea have resolved.  Over the last few days complains of increased frequency of urination.  No dysuria.  No foul-smelling urine.  No back pain.  No fevers or chills.   Review of Systems  Constitutional: Positive for malaise/fatigue. Negative for chills, diaphoresis, fever and weight loss.  HENT: Negative for nosebleeds and sore throat.   Eyes: Negative for double vision.  Respiratory: Negative for cough, hemoptysis, sputum production, shortness of breath and wheezing.   Cardiovascular: Negative for chest pain, palpitations, orthopnea and leg swelling.  Gastrointestinal: Negative for abdominal pain, blood in stool, constipation, heartburn, melena, nausea and vomiting.  Genitourinary: Positive for frequency.  Musculoskeletal: Positive for back pain and joint pain.  Skin: Positive for rash.  Neurological: Negative for dizziness, tingling, focal weakness, weakness and headaches.  Endo/Heme/Allergies: Does not bruise/bleed easily.  Psychiatric/Behavioral: Negative for depression. The patient is not nervous/anxious and does not have insomnia.  PAST MEDICAL HISTORY :  Past Medical History:   Diagnosis Date  . Arthritis   . Cancer of lower-outer quadrant of female breast ([place]) 08/06/2011   RIGHT, chemo and bilateral mastectomies.  . High cholesterol   . History of kidney stones   . Hx of bilateral breast implants   . Hypertension   . Lung metastases ([place]) [address]   chemo pills and faslidex shots.  Marland Kitchen PONV (postoperative nausea and vomiting)   . Type II diabetes mellitus ([facility])    fasting 140-150  . Vertigo    LAST WEEK  . Vertigo 01/2017    PAST SURGICAL HISTORY :   Past Surgical History:  Procedure Laterality Date  . BREAST BIOPSY  07/2011, 2020   right  . BREAST RECONSTRUCTION  03/07/2012   Procedure: BREAST RECONSTRUCTION;  Surgeon: Crissie Reese, MD;  Location: [place];  Service: Plastics;  Laterality: Left;  BREAST RECONSTRUCTION WITH PLACEMENT OF TISSUE EXPANDER TO LEFT BREAST  . BREAST RECONSTRUCTION  02/06/2013  . CESAREAN SECTION  [id number]  . ENDOBRONCHIAL ULTRASOUND N/A 02/24/2017   Procedure: ENDOBRONCHIAL ULTRASOUND;  Surgeon: [name], MD;  Location: [facility];  Service: Cardiopulmonary;  Laterality: N/A;  . LATISSIMUS FLAP TO BREAST Right 02/06/2013   Procedure: LATISSIMUS FLAP TO RIGHT BREAST WITH IMPLANT;  Surgeon: Crissie Reese, MD;  Location: [place];  Service: Plastics;  Laterality: Right;  . MASTECTOMY  03/07/12   modified right; total left  . MODIFIED MASTECTOMY  03/07/2012   Procedure: MODIFIED MASTECTOMY;  Surgeon: Haywood Lasso, MD;  Location: [place];  Service: General;  Laterality: Right;  . PORTACATH PLACEMENT  07/2011  . SCAR REVISION  03/30/2012   Procedure: SCAR REVISION;  Surgeon: Haywood Lasso, MD;  Location: [place];  Service: General;  Laterality: Right;  CLOSURE OF MASTECTOMY INCISION  . WISDOM TOOTH EXTRACTION      FAMILY HISTORY :   Family History  Problem Relation Age of Onset  . Breast cancer Mother   . Diabetes Father     SOCIAL HISTORY:   Social History   Tobacco Use  . Smoking status: Never Smoker  .  Smokeless tobacco: Never Used  Vaping Use  . Vaping Use: Never used  Substance Use Topics  . Alcohol use: No  . Drug use: No    ALLERGIES:  is allergic to sulfa antibiotics.  MEDICATIONS:  Current Outpatient Medications  Medication Sig Dispense Refill  . atorvastatin (LIPITOR) 20 MG tablet Take 1 tablet (20 mg total) by mouth daily. 90 tablet 3  . betamethasone valerate (VALISONE) 0.1 % cream Apply 0.1 application topically as needed.    . Calcium Carbonate-Vitamin D (CALCIUM 600 + D PO) Take 1 tablet by mouth 2 (two) times daily.    . fluconazole (DIFLUCAN) 150 MG tablet Take 1 tablet (150 mg total) by mouth daily. Take another tablet in 72 hours if yeast infection not resolved. (Patient not taking: Reported on 01/31/2020) 2 tablet 0  . fulvestrant (FASLODEX) 250 MG/5ML injection Inject into the muscle every 30 (thirty) days. One injection each buttock over 1-2 minutes. Warm prior to use.    Marland Kitchen glipiZIDE (GLUCOTROL XL) 10 MG 24 hr tablet Take 1 tablet (10 mg total) by mouth 2 (two) times daily. 180 tablet 3  . insulin aspart (NOVOLOG FLEXPEN) 100 UNIT/ML FlexPen Inject 4 Units into the skin 3 (three) times daily with meals. 15 mL 0  . Insulin Detemir (LEVEMIR) 100 UNIT/ML Pen Inject 20 Units into  the skin at bedtime. 15 mL 0  . levothyroxine (SYNTHROID) 25 MCG tablet Take 1 tablet (25 mcg total) by mouth daily before breakfast. 90 tablet 1  . lisinopril (ZESTRIL) 10 MG tablet Take 10 mg by mouth daily.    Marland Kitchen loperamide (IMODIUM A-D) 2 MG tablet Take 2 mg by mouth 4 (four) times daily as needed for diarrhea or loose stools. (Patient not taking: Reported on 04/03/2020)    . mupirocin ointment (BACTROBAN) 2 % mupirocin 2 % topical ointment  APPLY 1 APPLICATION TOPICALLY 3 (THREE) TIMES DAILY.    Marland Kitchen ondansetron (ZOFRAN) 8 MG tablet Take 1 tablet (8 mg total) by mouth every 8 (eight) hours as needed for nausea or vomiting. 20 tablet 3  . ondansetron (ZOFRAN-ODT) 4 MG disintegrating tablet Take 1  tablet (4 mg total) by mouth every 8 (eight) hours as needed for nausea or vomiting. 20 tablet 3  . prochlorperazine (COMPAZINE) 10 MG tablet Take 1 tablet (10 mg total) by mouth every 6 (six) hours as needed for nausea or vomiting. 30 tablet 0  . promethazine (PHENERGAN) 25 MG tablet Take 1 tablet (25 mg total) by mouth every 6 (six) hours as needed for refractory nausea / vomiting. 30 tablet 0  . terbinafine (LAMISIL) 250 MG tablet Take 1 tablet (250 mg total) by mouth daily. 14 tablet 0  . VERZENIO 100 MG tablet TAKE 1 TABLET BY MOUTH TWICE DAILY 60 tablet 3   No current facility-administered medications for this visit.   Facility-Administered Medications Ordered in Other Visits  Medication Dose Route Frequency Provider Last Rate Last Admin  . fulvestrant (FASLODEX) injection 500 mg  500 mg Intramuscular Once Charlaine Dalton R, MD      . influenza vaccine adjuvanted (FLUAD) injection 0.5 mL  0.5 mL Intramuscular Once [name], MD        PHYSICAL EXAMINATION: ECOG PERFORMANCE STATUS: 1 - Symptomatic but completely ambulatory  BP (!) 146/79 (BP Location: Left Arm, Patient Position: Sitting, Cuff Size: Normal)   Pulse 96   Temp 98 F (36.7 C) (Tympanic)   Wt 173 lb 6.4 oz (78.7 kg)   SpO2 98%   BMI 31.72 kg/m   Filed Weights   05/30/20 1050  Weight: 173 lb 6.4 oz (78.7 kg)    Physical Exam Constitutional:      Comments: She is alone.   HENT:     Head: Normocephalic and atraumatic.     Mouth/Throat:     Pharynx: No oropharyngeal exudate.  Eyes:     Pupils: Pupils are equal, round, and reactive to light.  Cardiovascular:     Rate and Rhythm: Normal rate and regular rhythm.  Pulmonary:     Effort: Pulmonary effort is normal. No respiratory distress.     Breath sounds: Normal breath sounds. No wheezing.  Abdominal:     General: Bowel sounds are normal. There is no distension.     Palpations: Abdomen is soft. There is no mass.     Tenderness: There is no  abdominal tenderness. There is no guarding or rebound.  Musculoskeletal:        General: No tenderness. Normal range of motion.     Cervical back: Normal range of motion and neck supple.     Comments: Approximately 1 cm hard nodule felt in the anterior chest wall/left parasternal.  Skin:    General: Skin is warm.     Comments: Maculopapular rash in the left inguinal region/fold.  Neurological:  Mental Status: She is alert and oriented to person, place, and time.  Psychiatric:        Mood and Affect: Affect normal.     LABORATORY DATA:  I have reviewed the data as listed    Component Value Date/Time   NA 132 (L) 05/30/2020 1022   NA 135 11/28/2014 1401   K 4.0 05/30/2020 1022   K 4.7 11/28/2014 1401   CL 97 (L) 05/30/2020 1022   CL 99 (L) 11/28/2014 1401   CO2 23 05/30/2020 1022   CO2 28 11/28/2014 1401   GLUCOSE 329 (H) 05/30/2020 1022   GLUCOSE 215 (H) 11/28/2014 1401   BUN 15 05/30/2020 1022   BUN 16 11/28/2014 1401   CREATININE 1.06 (H) 05/30/2020 1022   CREATININE 0.96 11/28/2014 1401   CALCIUM 9.5 05/30/2020 1022   CALCIUM 9.9 11/28/2014 1401   PROT 7.0 05/30/2020 1022   PROT 7.6 11/28/2014 1401   ALBUMIN 3.5 05/30/2020 1022   ALBUMIN 4.5 11/28/2014 1401   AST 43 (H) 05/30/2020 1022   AST 26 11/28/2014 1401   ALT 51 (H) 05/30/2020 1022   ALT 28 11/28/2014 1401   ALKPHOS 137 (H) 05/30/2020 1022   ALKPHOS 78 11/28/2014 1401   BILITOT 0.8 05/30/2020 1022   BILITOT 0.5 11/28/2014 1401   GFRNONAA 55 (L) 05/30/2020 1022   GFRNONAA >60 11/28/2014 1401   GFRAA >60 05/30/2020 1022   GFRAA >60 11/28/2014 1401    No results found for: SPEP, UPEP  Lab Results  Component Value Date   WBC 3.6 (L) 05/30/2020   NEUTROABS 1.9 05/30/2020   HGB 11.8 (L) 05/30/2020   HCT 33.1 (L) 05/30/2020   MCV 93.0 05/30/2020   PLT 243 05/30/2020      Chemistry      Component Value Date/Time   NA 132 (L) 05/30/2020 1022   NA 135 11/28/2014 1401   K 4.0 05/30/2020 1022   K  4.7 11/28/2014 1401   CL 97 (L) 05/30/2020 1022   CL 99 (L) 11/28/2014 1401   CO2 23 05/30/2020 1022   CO2 28 11/28/2014 1401   BUN 15 05/30/2020 1022   BUN 16 11/28/2014 1401   CREATININE 1.06 (H) 05/30/2020 1022   CREATININE 0.96 11/28/2014 1401      Component Value Date/Time   CALCIUM 9.5 05/30/2020 1022   CALCIUM 9.9 11/28/2014 1401   ALKPHOS 137 (H) 05/30/2020 1022   ALKPHOS 78 11/28/2014 1401   AST 43 (H) 05/30/2020 1022   AST 26 11/28/2014 1401   ALT 51 (H) 05/30/2020 1022   ALT 28 11/28/2014 1401   BILITOT 0.8 05/30/2020 1022   BILITOT 0.5 11/28/2014 1401       RADIOGRAPHIC STUDIES: I have personally reviewed the radiological images as listed and agreed with the findings in the report. No results found.   ASSESSMENT & PLAN:  Carcinoma of lower-outer quadrant of right breast in female, estrogen receptor positive ([place])  # Recurrent breast cancer-ER positive PR negative ? HER-2/neu-currently on Faslodex plus abema.  AUG 4th 2021- CT A/P- No evidence of any progressive metastatic disease- STABLE   # Continue Faslodex; CONTINUE Abema 100 BID; Labs today-white count-3.6; ANC- 1.9  #Recent nausea vomiting diarrhea/elevated LFTs-question visit to food poisoning/viral infection.  Currently resolved.  AST ALT improving.  Normal bilirubin.  #Increased frequency of urination/mild dysuria- check UA/culture today.    # CKD- stage III- monitor closely- STABLE  # Flu shot today; COVID BOOSTER: Discussed given patient's diagnosis and  other comorbidities/therapies-patient would be considered immunocompromised.  As per CDC recommendation/FDA approval-I would recommend booster vaccine.  Patient is interested.    #DISPOSITION: # Faslodex/today  # Follow up 1 month MD/ labs (CBC/CMP/Ca 27-29) Faslodex; -Dr.B       No orders of the defined types were placed in this encounter.  All questions were answered. The patient knows to call the clinic with any problems, questions or  concerns.      [name], MD 05/30/2020 11:14 AM

## 2020-05-30 NOTE — Progress Notes (Signed)
UA suggestive of possible infection.  Given symptoms recommend treatment with ciprofloxacin.  Await cultures.  H/T-please inform patient.

## 2020-05-30 NOTE — Progress Notes (Signed)
Spoke with patient. She is aware that her prelim results for u/a demonstrated +UTI. Pt made aware that md sent script for cipro to her pharmacy. Encouraged patient to increase her po fluid intake while using the cipro

## 2020-05-31 LAB — CANCER ANTIGEN 27.29: CA 27.29: 30.4 U/mL (ref 0.0–38.6)

## 2020-06-02 LAB — URINE CULTURE: Culture: >=100000 — AB

## 2020-06-30 ENCOUNTER — Encounter: Payer: Self-pay | Admitting: Internal Medicine

## 2020-06-30 ENCOUNTER — Inpatient Hospital Stay: Payer: PPO | Admitting: Internal Medicine

## 2020-06-30 ENCOUNTER — Inpatient Hospital Stay: Payer: PPO | Attending: Internal Medicine

## 2020-06-30 ENCOUNTER — Other Ambulatory Visit: Payer: Self-pay

## 2020-06-30 ENCOUNTER — Inpatient Hospital Stay: Payer: PPO

## 2020-06-30 DIAGNOSIS — Z170 Estrogen receptor positive status [ER+]: Secondary | ICD-10-CM

## 2020-06-30 DIAGNOSIS — F419 Anxiety disorder, unspecified: Secondary | ICD-10-CM | POA: Insufficient documentation

## 2020-06-30 DIAGNOSIS — N183 Chronic kidney disease, stage 3 unspecified: Secondary | ICD-10-CM | POA: Insufficient documentation

## 2020-06-30 DIAGNOSIS — Z87442 Personal history of urinary calculi: Secondary | ICD-10-CM | POA: Insufficient documentation

## 2020-06-30 DIAGNOSIS — E119 Type 2 diabetes mellitus without complications: Secondary | ICD-10-CM | POA: Insufficient documentation

## 2020-06-30 DIAGNOSIS — I10 Essential (primary) hypertension: Secondary | ICD-10-CM | POA: Diagnosis not present

## 2020-06-30 DIAGNOSIS — Z9013 Acquired absence of bilateral breasts and nipples: Secondary | ICD-10-CM | POA: Diagnosis not present

## 2020-06-30 DIAGNOSIS — Z5111 Encounter for antineoplastic chemotherapy: Secondary | ICD-10-CM | POA: Diagnosis not present

## 2020-06-30 DIAGNOSIS — Z9221 Personal history of antineoplastic chemotherapy: Secondary | ICD-10-CM | POA: Diagnosis not present

## 2020-06-30 DIAGNOSIS — E7800 Pure hypercholesterolemia, unspecified: Secondary | ICD-10-CM | POA: Diagnosis not present

## 2020-06-30 DIAGNOSIS — Z794 Long term (current) use of insulin: Secondary | ICD-10-CM | POA: Diagnosis not present

## 2020-06-30 DIAGNOSIS — C50511 Malignant neoplasm of lower-outer quadrant of right female breast: Secondary | ICD-10-CM | POA: Insufficient documentation

## 2020-06-30 DIAGNOSIS — Z833 Family history of diabetes mellitus: Secondary | ICD-10-CM | POA: Insufficient documentation

## 2020-06-30 DIAGNOSIS — Z803 Family history of malignant neoplasm of breast: Secondary | ICD-10-CM | POA: Diagnosis not present

## 2020-06-30 DIAGNOSIS — Z79899 Other long term (current) drug therapy: Secondary | ICD-10-CM | POA: Diagnosis not present

## 2020-06-30 DIAGNOSIS — C7800 Secondary malignant neoplasm of unspecified lung: Secondary | ICD-10-CM | POA: Diagnosis not present

## 2020-06-30 DIAGNOSIS — Z923 Personal history of irradiation: Secondary | ICD-10-CM | POA: Insufficient documentation

## 2020-06-30 LAB — CBC WITH DIFFERENTIAL/PLATELET
Abs Immature Granulocytes: 0.01 10*3/uL (ref 0.00–0.07)
Basophils Absolute: 0.1 10*3/uL (ref 0.0–0.1)
Basophils Relative: 3 %
Eosinophils Absolute: 0.1 10*3/uL (ref 0.0–0.5)
Eosinophils Relative: 3 %
HCT: 37.2 % (ref 36.0–46.0)
Hemoglobin: 12.8 g/dL (ref 12.0–15.0)
Immature Granulocytes: 0 %
Lymphocytes Relative: 47 %
Lymphs Abs: 1.4 10*3/uL (ref 0.7–4.0)
MCH: 32.3 pg (ref 26.0–34.0)
MCHC: 34.4 g/dL (ref 30.0–36.0)
MCV: 93.9 fL (ref 80.0–100.0)
Monocytes Absolute: 0.3 10*3/uL (ref 0.1–1.0)
Monocytes Relative: 11 %
Neutro Abs: 1 10*3/uL — ABNORMAL LOW (ref 1.7–7.7)
Neutrophils Relative %: 36 %
Platelets: 240 10*3/uL (ref 150–400)
RBC: 3.96 MIL/uL (ref 3.87–5.11)
RDW: 13.4 % (ref 11.5–15.5)
WBC: 2.8 10*3/uL — ABNORMAL LOW (ref 4.0–10.5)
nRBC: 0 % (ref 0.0–0.2)

## 2020-06-30 LAB — COMPREHENSIVE METABOLIC PANEL
ALT: 23 U/L (ref 0–44)
AST: 32 U/L (ref 15–41)
Albumin: 3.4 g/dL — ABNORMAL LOW (ref 3.5–5.0)
Alkaline Phosphatase: 108 U/L (ref 38–126)
Anion gap: 11 (ref 5–15)
BUN: 13 mg/dL (ref 8–23)
CO2: 23 mmol/L (ref 22–32)
Calcium: 9.4 mg/dL (ref 8.9–10.3)
Chloride: 97 mmol/L — ABNORMAL LOW (ref 98–111)
Creatinine, Ser: 1.28 mg/dL — ABNORMAL HIGH (ref 0.44–1.00)
GFR, Estimated: 46 mL/min — ABNORMAL LOW (ref >60)
Glucose, Bld: 351 mg/dL — ABNORMAL HIGH (ref 70–99)
Potassium: 3.6 mmol/L (ref 3.5–5.1)
Sodium: 131 mmol/L — ABNORMAL LOW (ref 135–145)
Total Bilirubin: 0.6 mg/dL (ref 0.3–1.2)
Total Protein: 7.5 g/dL (ref 6.5–8.1)

## 2020-06-30 MED ORDER — FULVESTRANT 250 MG/5ML IM SOLN
500.0000 mg | Freq: Once | INTRAMUSCULAR | Status: AC
  Administered 2020-06-30: 500 mg via INTRAMUSCULAR
  Filled 2020-06-30: qty 10

## 2020-06-30 NOTE — Assessment & Plan Note (Addendum)
#  Recurrent breast cancer-ER positive PR negative ? HER-2/neu-currently on Faslodex plus abema.  AUG 4th 2021- CT A/P- No evidence of any progressive metastatic disease- STABLE   # Continue Faslodex; CONTINUE Abema 100 BID; Labs today-white count-2.3; ANC- 1.0  #  Poorly controlled BG- 351; recommend better control of BG.    # CKD- stage III- monitor closely;Creatinine- 1.2; drink fluids-STABLE.    # Tachycardia- HR- 119; sinus; ? Anxiety. Monitor for now.    #DISPOSITION: # Faslodex/today  # Follow up 1 month MD/ labs (CBC/CMP/Ca 27-29) Faslodex; -Dr.B      

## 2020-06-30 NOTE — Progress Notes (Signed)
.Kimberly [name] OFFICE PROGRESS NOTE  Patient Care Team: Patient, No Pcp Per as PCP - General ([place]) Neldon Mc, MD (General Surgery) Everlene Farrier, MD (Obstetrics and Gynecology) Noreene Filbert, MD Forest Gleason, MD (Inactive) (Unknown Physician Specialty)  Cancer Staging Carcinoma of lower-outer quadrant of right breast in female, estrogen receptor positive [facility]) Staging form: Breast, AJCC 7th Edition - Pathologic: ypT1c,ypN2a, MX - Signed by Haywood Lasso, MD on 03/10/2012    Oncology History Overview Note  # DEC 2012- RIGHT BREAST CA T2 N2 M0 tumor from biopsy.  Estrogen receptor positive, Progesterone receptor positive.  Current receptor negative by FISH 2. Neoadjuvant chemotherapy started in December of 28 with Cytoxan Adriamycin 3. Started on Taxol weekly chemotherapy. 4. Patient finished 12  cycles of Taxol chemotherapy in May of 2013.     5. Status post right modified radical mastectomy [Dr.Bowers; [place]] June of 2013, ypT1c  yp N2  MO. started also on Lerazole    7. Radiation therapy to the right breast (September of 2013).  Lymph node was positive for HER-2 receptor gene amplification of 2.22.  Will proceed with Herceptin treatment starting in September of 2013.   8.Patient has finished Herceptin (maintenance therapy) in August of 2014 8. Start patient on letrozole from November, 2013. 9. Patient started on Herceptin in September 2013.    10Patient finished 12 months of Herceptin therapy on August, 2014  # 6. Status post left side prophylactic mastectomy.  #Late MAY 2018-RECURRENCE BREAST CA- ER positive/PR negative; ?? HER-2/neu- [biopsy- proven-mediastinal lymph node; in suff for her 2 testing].  [elevated Tumor marker- CT/PET- uptake in Right Mediastinal LN; Sternum [June 2018 EBUS- Dr.Kasa]   # March 10 2017- faslodex + Abema; OCT 5th CT-PR of mediastinal LN [consent]  #August 2020-left chest wall nodule biopsy benign  #August  2019- DVT-right upper extremity; Eliquis; stop end of March 2021 [repeat ultrasound negative/patient preference] --------------------------------------------    DIAGNOSIS: [ BREAST CANCER- ER/PR/HER2 NEU POS  STAGE:  4   ;GOALS: PALLIATIVE  CURRENT/MOST RECENT THERAPY - ABEMA + FASLODEX    Carcinoma of lower-outer quadrant of right breast in female, estrogen receptor positive ([place])   INTERVAL HISTORY:  Kimberly [name] 64 y.o.  female pleasant patient above history of metastatic ER PR positive HER-2/neu positive breast cancer currently on abema+ Faslodex is here for follow-up.  Patient denies any nausea vomiting. Denies any fevers or chills. No diarrhea. Denies any recent UTIs. Denies any dizzy spells or falls.   Review of Systems  Constitutional: Positive for malaise/fatigue. Negative for chills, diaphoresis, fever and weight loss.  HENT: Negative for nosebleeds and sore throat.   Eyes: Negative for double vision.  Respiratory: Negative for cough, hemoptysis, sputum production, shortness of breath and wheezing.   Cardiovascular: Negative for chest pain, palpitations, orthopnea and leg swelling.  Gastrointestinal: Negative for abdominal pain, blood in stool, constipation, heartburn, melena, nausea and vomiting.  Musculoskeletal: Positive for back pain and joint pain.  Neurological: Negative for dizziness, tingling, focal weakness, weakness and headaches.  Endo/Heme/Allergies: Does not bruise/bleed easily.  Psychiatric/Behavioral: Negative for depression. The patient is not nervous/anxious and does not have insomnia.       PAST MEDICAL HISTORY :  Past Medical History:  Diagnosis Date  . Arthritis   . Cancer of lower-outer quadrant of female breast ([place]) 08/06/2011   RIGHT, chemo and bilateral mastectomies.  . High cholesterol   . History of kidney stones   . Hx of bilateral breast implants   .  Hypertension   . Lung metastases ([place]) 20118   chemo pills and faslidex shots.   Marland Kitchen PONV (postoperative nausea and vomiting)   . Type II diabetes mellitus ([facility])    fasting 140-150  . Vertigo    LAST WEEK  . Vertigo 01/2017    PAST SURGICAL HISTORY :   Past Surgical History:  Procedure Laterality Date  . BREAST BIOPSY  07/2011, 2020   right  . BREAST RECONSTRUCTION  03/07/2012   Procedure: BREAST RECONSTRUCTION;  Surgeon: Crissie Reese, MD;  Location: [place];  Service: Plastics;  Laterality: Left;  BREAST RECONSTRUCTION WITH PLACEMENT OF TISSUE EXPANDER TO LEFT BREAST  . BREAST RECONSTRUCTION  02/06/2013  . CESAREAN SECTION  [id number]  . ENDOBRONCHIAL ULTRASOUND N/A 02/24/2017   Procedure: ENDOBRONCHIAL ULTRASOUND;  Surgeon: [name], MD;  Location: [facility];  Service: Cardiopulmonary;  Laterality: N/A;  . LATISSIMUS FLAP TO BREAST Right 02/06/2013   Procedure: LATISSIMUS FLAP TO RIGHT BREAST WITH IMPLANT;  Surgeon: Crissie Reese, MD;  Location: [facility];  Service: Plastics;  Laterality: Right;  . MASTECTOMY  03/07/12   modified right; total left  . MODIFIED MASTECTOMY  03/07/2012   Procedure: MODIFIED MASTECTOMY;  Surgeon: Haywood Lasso, MD;  Location: [facility];  Service: General;  Laterality: Right;  . PORTACATH PLACEMENT  07/2011  . SCAR REVISION  03/30/2012   Procedure: SCAR REVISION;  Surgeon: Haywood Lasso, MD;  Location: [facility];  Service: General;  Laterality: Right;  CLOSURE OF MASTECTOMY INCISION  . WISDOM TOOTH EXTRACTION      FAMILY HISTORY :   Family History  Problem Relation Age of Onset  . Breast cancer Mother   . Diabetes Father     SOCIAL HISTORY:   Social History   Tobacco Use  . Smoking status: Never Smoker  . Smokeless tobacco: Never Used  Vaping Use  . Vaping Use: Never used  Substance Use Topics  . Alcohol use: No  . Drug use: No    ALLERGIES:  is allergic to sulfa antibiotics.  MEDICATIONS:  Current Outpatient Medications  Medication Sig Dispense Refill  . atorvastatin (LIPITOR) 20 MG tablet Take 1 tablet  (20 mg total) by mouth daily. 90 tablet 3  . betamethasone valerate (VALISONE) 0.1 % cream Apply 0.1 application topically as needed.    . Calcium Carbonate-Vitamin D (CALCIUM 600 + D PO) Take 1 tablet by mouth 2 (two) times daily.    . fulvestrant (FASLODEX) 250 MG/5ML injection Inject into the muscle every 30 (thirty) days. One injection each buttock over 1-2 minutes. Warm prior to use.    Marland Kitchen glipiZIDE (GLUCOTROL XL) 10 MG 24 hr tablet Take 1 tablet (10 mg total) by mouth 2 (two) times daily. 180 tablet 3  . insulin aspart (NOVOLOG FLEXPEN) 100 UNIT/ML FlexPen Inject 4 Units into the skin 3 (three) times daily with meals. 15 mL 0  . Insulin Detemir (LEVEMIR) 100 UNIT/ML Pen Inject 20 Units into the skin at bedtime. 15 mL 0  . levothyroxine (SYNTHROID) 25 MCG tablet Take 1 tablet (25 mcg total) by mouth daily before breakfast. 90 tablet 1  . lisinopril (ZESTRIL) 10 MG tablet Take 10 mg by mouth daily.    Marland Kitchen terbinafine (LAMISIL) 250 MG tablet Take 1 tablet (250 mg total) by mouth daily. 14 tablet 0  . VERZENIO 100 MG tablet TAKE 1 TABLET BY MOUTH TWICE DAILY 60 tablet 3  . loperamide (IMODIUM A-D) 2 MG tablet Take 2 mg by mouth  4 (four) times daily as needed for diarrhea or loose stools. (Patient not taking: Reported on 04/03/2020)    . mupirocin ointment (BACTROBAN) 2 % mupirocin 2 % topical ointment  APPLY 1 APPLICATION TOPICALLY 3 (THREE) TIMES DAILY. (Patient not taking: Reported on 06/30/2020)    . ondansetron (ZOFRAN) 8 MG tablet Take 1 tablet (8 mg total) by mouth every 8 (eight) hours as needed for nausea or vomiting. (Patient not taking: Reported on 06/30/2020) 20 tablet 3  . ondansetron (ZOFRAN-ODT) 4 MG disintegrating tablet Take 1 tablet (4 mg total) by mouth every 8 (eight) hours as needed for nausea or vomiting. (Patient not taking: Reported on 06/30/2020) 20 tablet 3  . prochlorperazine (COMPAZINE) 10 MG tablet Take 1 tablet (10 mg total) by mouth every 6 (six) hours as needed for nausea or  vomiting. (Patient not taking: Reported on 06/30/2020) 30 tablet 0  . promethazine (PHENERGAN) 25 MG tablet Take 1 tablet (25 mg total) by mouth every 6 (six) hours as needed for refractory nausea / vomiting. (Patient not taking: Reported on 06/30/2020) 30 tablet 0   No current facility-administered medications for this visit.    PHYSICAL EXAMINATION: ECOG PERFORMANCE STATUS: 1 - Symptomatic but completely ambulatory  BP (!) 143/83 (BP Location: Left Arm, Patient Position: Sitting)   Pulse (!) 116   Temp 98.9 F (37.2 C) (Oral)   Resp 20   Ht '5\' 2"'  (1.575 m)   Wt 169 lb 4.8 oz (76.8 kg)   BMI 30.97 kg/m   Filed Weights   06/30/20 1025  Weight: 169 lb 4.8 oz (76.8 kg)    Physical Exam Constitutional:      Comments: She is alone.   HENT:     Head: Normocephalic and atraumatic.     Mouth/Throat:     Pharynx: No oropharyngeal exudate.  Eyes:     Pupils: Pupils are equal, round, and reactive to light.  Cardiovascular:     Rate and Rhythm: Normal rate and regular rhythm.  Pulmonary:     Effort: Pulmonary effort is normal. No respiratory distress.     Breath sounds: Normal breath sounds. No wheezing.  Abdominal:     General: Bowel sounds are normal. There is no distension.     Palpations: Abdomen is soft. There is no mass.     Tenderness: There is no abdominal tenderness. There is no guarding or rebound.  Musculoskeletal:        General: No tenderness. Normal range of motion.     Cervical back: Normal range of motion and neck supple.     Comments: Approximately 1 cm hard nodule felt in the anterior chest wall/left parasternal.  Skin:    General: Skin is warm.     Comments: Maculopapular rash in the left inguinal region/fold.  Neurological:     Mental Status: She is alert and oriented to person, place, and time.  Psychiatric:        Mood and Affect: Affect normal.     LABORATORY DATA:  I have reviewed the data as listed    Component Value Date/Time   NA 131 (L)  06/30/2020 0957   NA 135 11/28/2014 1401   K 3.6 06/30/2020 0957   K 4.7 11/28/2014 1401   CL 97 (L) 06/30/2020 0957   CL 99 (L) 11/28/2014 1401   CO2 23 06/30/2020 0957   CO2 28 11/28/2014 1401   GLUCOSE 351 (H) 06/30/2020 0957   GLUCOSE 215 (H) 11/28/2014 1401   BUN 13  06/30/2020 0957   BUN 16 11/28/2014 1401   CREATININE 1.28 (H) 06/30/2020 0957   CREATININE 0.96 11/28/2014 1401   CALCIUM 9.4 06/30/2020 0957   CALCIUM 9.9 11/28/2014 1401   PROT 7.5 06/30/2020 0957   PROT 7.6 11/28/2014 1401   ALBUMIN 3.4 (L) 06/30/2020 0957   ALBUMIN 4.5 11/28/2014 1401   AST 32 06/30/2020 0957   AST 26 11/28/2014 1401   ALT 23 06/30/2020 0957   ALT 28 11/28/2014 1401   ALKPHOS 108 06/30/2020 0957   ALKPHOS 78 11/28/2014 1401   BILITOT 0.6 06/30/2020 0957   BILITOT 0.5 11/28/2014 1401   GFRNONAA 46 (L) 06/30/2020 0957   GFRNONAA >60 11/28/2014 1401   GFRAA >60 05/30/2020 1022   GFRAA >60 11/28/2014 1401    No results found for: SPEP, UPEP  Lab Results  Component Value Date   WBC 2.8 (L) 06/30/2020   NEUTROABS 1.0 (L) 06/30/2020   HGB 12.8 06/30/2020   HCT 37.2 06/30/2020   MCV 93.9 06/30/2020   PLT 240 06/30/2020      Chemistry      Component Value Date/Time   NA 131 (L) 06/30/2020 0957   NA 135 11/28/2014 1401   K 3.6 06/30/2020 0957   K 4.7 11/28/2014 1401   CL 97 (L) 06/30/2020 0957   CL 99 (L) 11/28/2014 1401   CO2 23 06/30/2020 0957   CO2 28 11/28/2014 1401   BUN 13 06/30/2020 0957   BUN 16 11/28/2014 1401   CREATININE 1.28 (H) 06/30/2020 0957   CREATININE 0.96 11/28/2014 1401      Component Value Date/Time   CALCIUM 9.4 06/30/2020 0957   CALCIUM 9.9 11/28/2014 1401   ALKPHOS 108 06/30/2020 0957   ALKPHOS 78 11/28/2014 1401   AST 32 06/30/2020 0957   AST 26 11/28/2014 1401   ALT 23 06/30/2020 0957   ALT 28 11/28/2014 1401   BILITOT 0.6 06/30/2020 0957   BILITOT 0.5 11/28/2014 1401       RADIOGRAPHIC STUDIES: I have personally reviewed the  radiological images as listed and agreed with the findings in the report. No results found.   ASSESSMENT & PLAN:  Carcinoma of lower-outer quadrant of right breast in female, estrogen receptor positive ([place])  # Recurrent breast cancer-ER positive PR negative ? HER-2/neu-currently on Faslodex plus abema.  AUG 4th 2021- CT A/P- No evidence of any progressive metastatic disease- STABLE   # Continue Faslodex; CONTINUE Abema 100 BID; Labs today-white count-2.3; ANC- 1.0  #  Poorly controlled BG- 351; recommend better control of BG.    # CKD- stage III- monitor closely;Creatinine- 1.2; drink fluids-STABLE.    # Tachycardia- HR- 119; sinus; ? Anxiety. Monitor for now.    #DISPOSITION: # Faslodex/today  # Follow up 1 month MD/ labs (CBC/CMP/Ca 27-29) Faslodex; -Dr.B       No orders of the defined types were placed in this encounter.  All questions were answered. The patient knows to call the clinic with any problems, questions or concerns.      [name], MD 06/30/2020 3:57 PM

## 2020-07-01 LAB — CANCER ANTIGEN 27.29: CA 27.29: 44 U/mL — ABNORMAL HIGH (ref 0.0–38.6)

## 2020-07-03 ENCOUNTER — Telehealth: Payer: Self-pay | Admitting: Internal Medicine

## 2020-07-03 ENCOUNTER — Telehealth: Payer: Self-pay | Admitting: Pharmacy Technician

## 2020-07-03 NOTE — Telephone Encounter (Signed)
On 11/02- spoke to pt re: results of elevated TM- will continue to monitor for now. Pt in agreement,

## 2020-07-04 NOTE — Telephone Encounter (Signed)
Oral Oncology Patient Advocate Encounter  Received notification from [employer] that the request for prior authorization for Verzenio has been denied due to patient not having a contraindication or trial/failure of Ibrance or Kisqali.      This determination is currently being appealed.  The appeal packet was faxed to [phone].  Phone number to follow up on appeal status is [phone]   This encounter will continue to be updated until final determination.    [place] Patient [place] Phone [phone] Fax [phone] 07/08/2020 10:17 AM

## 2020-07-04 NOTE — Telephone Encounter (Signed)
Oral Oncology Patient Advocate Encounter  Received notification from [employer] that prior authorization for Verzenio is required.  PA submitted on CoverMyMeds on 07/03/20 Key [id number] Status is pending  Oral Oncology Clinic will continue to follow.  [place] Patient [place] Phone [phone] Fax [phone] 07/04/2020 9:09 AM

## 2020-07-08 NOTE — Telephone Encounter (Signed)
Oral Oncology Patient Advocate Encounter  Prior Authorization for Kimberly [name] has been approved.    PA# [id number] Effective dates: 07/08/20 through 07/08/21  Oral Oncology Clinic will continue to follow.   Kimberly [name] Patient [place] Phone [phone] Fax [phone] 07/08/2020 2:54 PM

## 2020-07-09 ENCOUNTER — Telehealth: Payer: Self-pay | Admitting: Pharmacy Technician

## 2020-07-09 NOTE — Telephone Encounter (Signed)
Oral Oncology Patient Advocate Encounter  Coordinated through email for patient to sign application for [employer] Patient Assistance in an effort to reduce patient's out of pocket expense for Verzenio to $0.    Application completed and faxed to [phone].   [employer] patient assistance phone number for follow up is [phone].   This encounter will be updated until final determination.   [place] Patient [place] Phone [phone] Fax [phone] 07/09/2020 3:20 PM

## 2020-07-14 NOTE — Telephone Encounter (Signed)
Oral Oncology Patient Advocate Encounter  Received notification from [facility] Patient Assistance program that patient has been successfully enrolled into their program to receive Verzenio from the manufacturer at $0 out of pocket until 08/29/21.    I called and spoke with patient.  She knows we will have to re-apply.   Specialty Pharmacy that will dispense medication is [facility].  Patient knows to call the office with questions or concerns.   Oral Oncology Clinic will continue to follow.  [place] Patient [place] Phone [phone] Fax [phone] 07/14/2020 9:52 AM

## 2020-08-04 ENCOUNTER — Inpatient Hospital Stay: Payer: PPO

## 2020-08-04 ENCOUNTER — Inpatient Hospital Stay: Payer: PPO | Admitting: Internal Medicine

## 2020-08-04 ENCOUNTER — Inpatient Hospital Stay: Payer: PPO | Attending: Internal Medicine

## 2020-08-04 ENCOUNTER — Other Ambulatory Visit: Payer: Self-pay

## 2020-08-04 ENCOUNTER — Encounter: Payer: Self-pay | Admitting: Internal Medicine

## 2020-08-04 DIAGNOSIS — E7800 Pure hypercholesterolemia, unspecified: Secondary | ICD-10-CM | POA: Diagnosis not present

## 2020-08-04 DIAGNOSIS — E119 Type 2 diabetes mellitus without complications: Secondary | ICD-10-CM | POA: Diagnosis not present

## 2020-08-04 DIAGNOSIS — Z9013 Acquired absence of bilateral breasts and nipples: Secondary | ICD-10-CM | POA: Diagnosis not present

## 2020-08-04 DIAGNOSIS — Z170 Estrogen receptor positive status [ER+]: Secondary | ICD-10-CM | POA: Diagnosis not present

## 2020-08-04 DIAGNOSIS — Z5111 Encounter for antineoplastic chemotherapy: Secondary | ICD-10-CM | POA: Insufficient documentation

## 2020-08-04 DIAGNOSIS — Z803 Family history of malignant neoplasm of breast: Secondary | ICD-10-CM | POA: Diagnosis not present

## 2020-08-04 DIAGNOSIS — N183 Chronic kidney disease, stage 3 unspecified: Secondary | ICD-10-CM | POA: Insufficient documentation

## 2020-08-04 DIAGNOSIS — Z923 Personal history of irradiation: Secondary | ICD-10-CM | POA: Diagnosis not present

## 2020-08-04 DIAGNOSIS — Z833 Family history of diabetes mellitus: Secondary | ICD-10-CM | POA: Insufficient documentation

## 2020-08-04 DIAGNOSIS — Z794 Long term (current) use of insulin: Secondary | ICD-10-CM | POA: Insufficient documentation

## 2020-08-04 DIAGNOSIS — C50511 Malignant neoplasm of lower-outer quadrant of right female breast: Secondary | ICD-10-CM

## 2020-08-04 DIAGNOSIS — Z86718 Personal history of other venous thrombosis and embolism: Secondary | ICD-10-CM | POA: Insufficient documentation

## 2020-08-04 DIAGNOSIS — I10 Essential (primary) hypertension: Secondary | ICD-10-CM | POA: Insufficient documentation

## 2020-08-04 DIAGNOSIS — Z79899 Other long term (current) drug therapy: Secondary | ICD-10-CM | POA: Insufficient documentation

## 2020-08-04 LAB — CBC WITH DIFFERENTIAL/PLATELET
Abs Immature Granulocytes: 0 10*3/uL (ref 0.00–0.07)
Basophils Absolute: 0.1 10*3/uL (ref 0.0–0.1)
Basophils Relative: 2 %
Eosinophils Absolute: 0 10*3/uL (ref 0.0–0.5)
Eosinophils Relative: 1 %
HCT: 35.9 % — ABNORMAL LOW (ref 36.0–46.0)
Hemoglobin: 12.5 g/dL (ref 12.0–15.0)
Immature Granulocytes: 0 %
Lymphocytes Relative: 44 %
Lymphs Abs: 1.3 10*3/uL (ref 0.7–4.0)
MCH: 33 pg (ref 26.0–34.0)
MCHC: 34.8 g/dL (ref 30.0–36.0)
MCV: 94.7 fL (ref 80.0–100.0)
Monocytes Absolute: 0.2 10*3/uL (ref 0.1–1.0)
Monocytes Relative: 8 %
Neutro Abs: 1.3 10*3/uL — ABNORMAL LOW (ref 1.7–7.7)
Neutrophils Relative %: 45 %
Platelets: 216 10*3/uL (ref 150–400)
RBC: 3.79 MIL/uL — ABNORMAL LOW (ref 3.87–5.11)
RDW: 13.5 % (ref 11.5–15.5)
WBC: 2.9 10*3/uL — ABNORMAL LOW (ref 4.0–10.5)
nRBC: 0 % (ref 0.0–0.2)

## 2020-08-04 LAB — COMPREHENSIVE METABOLIC PANEL
ALT: 20 U/L (ref 0–44)
AST: 30 U/L (ref 15–41)
Albumin: 3.6 g/dL (ref 3.5–5.0)
Alkaline Phosphatase: 96 U/L (ref 38–126)
Anion gap: 13 (ref 5–15)
BUN: 14 mg/dL (ref 8–23)
CO2: 23 mmol/L (ref 22–32)
Calcium: 9.7 mg/dL (ref 8.9–10.3)
Chloride: 96 mmol/L — ABNORMAL LOW (ref 98–111)
Creatinine, Ser: 0.87 mg/dL (ref 0.44–1.00)
GFR, Estimated: >60 mL/min (ref >60)
Glucose, Bld: 260 mg/dL — ABNORMAL HIGH (ref 70–99)
Potassium: 3.4 mmol/L — ABNORMAL LOW (ref 3.5–5.1)
Sodium: 132 mmol/L — ABNORMAL LOW (ref 135–145)
Total Bilirubin: 0.7 mg/dL (ref 0.3–1.2)
Total Protein: 7 g/dL (ref 6.5–8.1)

## 2020-08-04 MED ORDER — MUPIROCIN CALCIUM 2 % EX CREA: 1.0000 "application " | TOPICAL_CREAM | Freq: Two times a day (BID) | CUTANEOUS | 0 refills | Status: DC

## 2020-08-04 MED ORDER — FULVESTRANT 250 MG/5ML IM SOLN
500.0000 mg | Freq: Once | INTRAMUSCULAR | Status: AC
  Administered 2020-08-04: 500 mg via INTRAMUSCULAR
  Filled 2020-08-04: qty 10

## 2020-08-04 NOTE — Progress Notes (Signed)
Pt in for follow up, states still having vaginal itch and is requesting refill on bactroban ointment.

## 2020-08-04 NOTE — Assessment & Plan Note (Addendum)
#  Recurrent breast cancer-ER positive PR negative ? HER-2/neu-currently on Faslodex plus abema.  AUG 4th 2021- CT A/P- No evidence of any progressive metastatic disease- STABLE   # Continue Faslodex; CONTINUE Abema 100 BID; Labs today-white count-2.9; ANC- 1.3.   #  Poorly controlled BG- 260; recommend better control of BG. STABLE.    # CKD- stage III- monitor closely;Creatinine- 0.8- Improved.     # Rash in pelvic-? Awaiting evaluation with OB- macth 2022; reductant with derm referral. Will give script for mupirocin   #DISPOSITION: # Faslodex/today  # Follow up second week of jan 2022- MD/ labs (CBC/CMP/Ca 27-29) Faslodex;CT C/A/P prior -Dr.B

## 2020-08-04 NOTE — Progress Notes (Signed)
.[place] OFFICE PROGRESS NOTE  Patient Care Team: Patient, No Pcp Per as PCP - General (General Practice) Neldon Mc, MD (General Surgery) Everlene Farrier, MD (Obstetrics and Gynecology) Noreene Filbert, MD Forest Gleason, MD (Inactive) (Unknown Physician Specialty) [name], MD as Consulting Physician (Hematology and Oncology)  Cancer Staging Carcinoma of lower-outer quadrant of right breast in female, estrogen receptor positive [facility]) Staging form: Breast, AJCC 7th Edition - Pathologic: ypT1c,ypN2a, MX - Signed by Haywood Lasso, MD on 03/10/2012    Oncology History Overview Note  # DEC 2012- RIGHT BREAST CA T2 N2 M0 tumor from biopsy.  Estrogen receptor positive, Progesterone receptor positive.  Current receptor negative by FISH 2. Neoadjuvant chemotherapy started in December of 28 with Cytoxan Adriamycin 3. Started on Taxol weekly chemotherapy. 4. Patient finished 12  cycles of Taxol chemotherapy in May of 2013.     5. Status post right modified radical mastectomy [Dr.Bowers; [place]] June of 2013, ypT1c  yp N2  MO. started also on Lerazole    7. Radiation therapy to the right breast (September of 2013).  Lymph node was positive for HER-2 receptor gene amplification of 2.22.  Will proceed with Herceptin treatment starting in September of 2013.   8.Patient has finished Herceptin (maintenance therapy) in August of 2014 8. Start patient on letrozole from November, 2013. 9. Patient started on Herceptin in September 2013.    10Patient finished 12 months of Herceptin therapy on August, 2014  # 6. Status post left side prophylactic mastectomy.  #Late MAY 2018-RECURRENCE BREAST CA- ER positive/PR negative; ?? HER-2/neu- [biopsy- proven-mediastinal lymph node; in suff for her 2 testing].  [elevated Tumor marker- CT/PET- uptake in Right Mediastinal LN; Sternum [June 2018 EBUS- Dr.Kasa]   # March 10 2017- faslodex + Abema; OCT 5th CT-PR of mediastinal  LN [consent]  #August 2020-left chest wall nodule biopsy benign  #August 2019- DVT-right upper extremity; Eliquis; stop end of March 2021 [repeat ultrasound negative/patient preference] --------------------------------------------    DIAGNOSIS: [ BREAST CANCER- ER/PR/HER2 NEU POS  STAGE:  4   ;GOALS: PALLIATIVE  CURRENT/MOST RECENT THERAPY - ABEMA + FASLODEX    Carcinoma of lower-outer quadrant of right breast in female, estrogen receptor positive ([place])   INTERVAL HISTORY:  Kimberly [name] 64 y.o.  female pleasant patient above history of metastatic ER PR positive HER-2/neu positive breast cancer currently on abema+ Faslodex is here for follow-up.  Patient denies any abdominal pain nausea vomiting. Denies any headaches. Denies any diarrhea. No recent UTIs. No new lumps or bumps.  Complains of rash in the "private area"; improved with mupirocin [previously given by OB].   Review of Systems  Constitutional: Positive for malaise/fatigue. Negative for chills, diaphoresis, fever and weight loss.  HENT: Negative for nosebleeds and sore throat.   Eyes: Negative for double vision.  Respiratory: Negative for cough, hemoptysis, sputum production, shortness of breath and wheezing.   Cardiovascular: Negative for chest pain, palpitations, orthopnea and leg swelling.  Gastrointestinal: Negative for abdominal pain, blood in stool, constipation, heartburn, melena, nausea and vomiting.  Musculoskeletal: Positive for back pain and joint pain.  Neurological: Negative for dizziness, tingling, focal weakness, weakness and headaches.  Endo/Heme/Allergies: Does not bruise/bleed easily.  Psychiatric/Behavioral: Negative for depression. The patient is not nervous/anxious and does not have insomnia.       PAST MEDICAL HISTORY :  Past Medical History:  Diagnosis Date  . Arthritis   . Cancer of lower-outer quadrant of female breast ([facility]) 08/06/2011  RIGHT, chemo and bilateral mastectomies.  . High  cholesterol   . History of kidney stones   . Hx of bilateral breast implants   . Hypertension   . Lung metastases ([place]) 20118   chemo pills and faslidex shots.  Marland Kitchen PONV (postoperative nausea and vomiting)   . Type II diabetes mellitus ([facility])    fasting 140-150  . Vertigo    LAST WEEK  . Vertigo 01/2017    PAST SURGICAL HISTORY :   Past Surgical History:  Procedure Laterality Date  . BREAST BIOPSY  07/2011, 2020   right  . BREAST RECONSTRUCTION  03/07/2012   Procedure: BREAST RECONSTRUCTION;  Surgeon: Crissie Reese, MD;  Location: [place];  Service: Plastics;  Laterality: Left;  BREAST RECONSTRUCTION WITH PLACEMENT OF TISSUE EXPANDER TO LEFT BREAST  . BREAST RECONSTRUCTION  02/06/2013  . CESAREAN SECTION  [id number]  . ENDOBRONCHIAL ULTRASOUND N/A 02/24/2017   Procedure: ENDOBRONCHIAL ULTRASOUND;  Surgeon: [name], MD;  Location: [facility];  Service: Cardiopulmonary;  Laterality: N/A;  . LATISSIMUS FLAP TO BREAST Right 02/06/2013   Procedure: LATISSIMUS FLAP TO RIGHT BREAST WITH IMPLANT;  Surgeon: Crissie Reese, MD;  Location: [place];  Service: Plastics;  Laterality: Right;  . MASTECTOMY  03/07/12   modified right; total left  . MODIFIED MASTECTOMY  03/07/2012   Procedure: MODIFIED MASTECTOMY;  Surgeon: Haywood Lasso, MD;  Location: [place];  Service: General;  Laterality: Right;  . PORTACATH PLACEMENT  07/2011  . SCAR REVISION  03/30/2012   Procedure: SCAR REVISION;  Surgeon: Haywood Lasso, MD;  Location: [place];  Service: General;  Laterality: Right;  CLOSURE OF MASTECTOMY INCISION  . WISDOM TOOTH EXTRACTION      FAMILY HISTORY :   Family History  Problem Relation Age of Onset  . Breast cancer Mother   . Diabetes Father     SOCIAL HISTORY:   Social History   Tobacco Use  . Smoking status: Never Smoker  . Smokeless tobacco: Never Used  Vaping Use  . Vaping Use: Never used  Substance Use Topics  . Alcohol use: No  . Drug use: No    ALLERGIES:  is  allergic to sulfa antibiotics.  MEDICATIONS:  Current Outpatient Medications  Medication Sig Dispense Refill  . atorvastatin (LIPITOR) 20 MG tablet Take 1 tablet (20 mg total) by mouth daily. 90 tablet 3  . Calcium Carbonate-Vitamin D (CALCIUM 600 + D PO) Take 1 tablet by mouth 2 (two) times daily.    . fulvestrant (FASLODEX) 250 MG/5ML injection Inject into the muscle every 30 (thirty) days. One injection each buttock over 1-2 minutes. Warm prior to use.    Marland Kitchen glipiZIDE (GLUCOTROL XL) 10 MG 24 hr tablet Take 1 tablet (10 mg total) by mouth 2 (two) times daily. 180 tablet 3  . insulin aspart (NOVOLOG FLEXPEN) 100 UNIT/ML FlexPen Inject 4 Units into the skin 3 (three) times daily with meals. 15 mL 0  . Insulin Detemir (LEVEMIR) 100 UNIT/ML Pen Inject 20 Units into the skin at bedtime. 15 mL 0  . levothyroxine (SYNTHROID) 25 MCG tablet Take 1 tablet (25 mcg total) by mouth daily before breakfast. 90 tablet 1  . lisinopril (ZESTRIL) 10 MG tablet Take 10 mg by mouth daily.    . mupirocin ointment (BACTROBAN) 2 % mupirocin 2 % topical ointment  APPLY 1 APPLICATION TOPICALLY 3 (THREE) TIMES DAILY.    Marland Kitchen betamethasone valerate (VALISONE) 0.1 % cream Apply 0.1 application topically as needed. (  Patient not taking: Reported on 08/04/2020)    . loperamide (IMODIUM A-D) 2 MG tablet Take 2 mg by mouth 4 (four) times daily as needed for diarrhea or loose stools. (Patient not taking: Reported on 04/03/2020)    . mupirocin cream (BACTROBAN) 2 % Apply 1 application topically 2 (two) times daily. 15 g 0  . ondansetron (ZOFRAN) 8 MG tablet Take 1 tablet (8 mg total) by mouth every 8 (eight) hours as needed for nausea or vomiting. (Patient not taking: Reported on 06/30/2020) 20 tablet 3  . ondansetron (ZOFRAN-ODT) 4 MG disintegrating tablet Take 1 tablet (4 mg total) by mouth every 8 (eight) hours as needed for nausea or vomiting. (Patient not taking: Reported on 06/30/2020) 20 tablet 3  . prochlorperazine (COMPAZINE) 10  MG tablet Take 1 tablet (10 mg total) by mouth every 6 (six) hours as needed for nausea or vomiting. (Patient not taking: Reported on 06/30/2020) 30 tablet 0  . promethazine (PHENERGAN) 25 MG tablet Take 1 tablet (25 mg total) by mouth every 6 (six) hours as needed for refractory nausea / vomiting. (Patient not taking: Reported on 06/30/2020) 30 tablet 0  . terbinafine (LAMISIL) 250 MG tablet Take 1 tablet (250 mg total) by mouth daily. (Patient not taking: Reported on 08/04/2020) 14 tablet 0  . VERZENIO 100 MG tablet TAKE 1 TABLET BY MOUTH TWICE DAILY 56 tablet 0   No current facility-administered medications for this visit.    PHYSICAL EXAMINATION: ECOG PERFORMANCE STATUS: 1 - Symptomatic but completely ambulatory  BP 136/86 (BP Location: Left Arm, Patient Position: Sitting)   Pulse 92   Temp (!) 96.9 F (36.1 C) (Tympanic)   Resp 18   Wt 167 lb (75.8 kg)   SpO2 99%   BMI 30.54 kg/m   Filed Weights   08/04/20 1023  Weight: 167 lb (75.8 kg)    Physical Exam Constitutional:      Comments: She is alone.   HENT:     Head: Normocephalic and atraumatic.     Mouth/Throat:     Pharynx: No oropharyngeal exudate.  Eyes:     Pupils: Pupils are equal, round, and reactive to light.  Cardiovascular:     Rate and Rhythm: Normal rate and regular rhythm.  Pulmonary:     Effort: Pulmonary effort is normal. No respiratory distress.     Breath sounds: Normal breath sounds. No wheezing.  Abdominal:     General: Bowel sounds are normal. There is no distension.     Palpations: Abdomen is soft. There is no mass.     Tenderness: There is no abdominal tenderness. There is no guarding or rebound.  Musculoskeletal:        General: No tenderness. Normal range of motion.     Cervical back: Normal range of motion and neck supple.     Comments: Approximately 1 cm hard nodule felt in the anterior chest wall/left parasternal.  Skin:    General: Skin is warm.     Comments: Maculopapular rash in the  left inguinal region/fold.  Neurological:     Mental Status: She is alert and oriented to person, place, and time.  Psychiatric:        Mood and Affect: Affect normal.     LABORATORY DATA:  I have reviewed the data as listed    Component Value Date/Time   NA 132 (L) 08/04/2020 0954   NA 135 11/28/2014 1401   K 3.4 (L) 08/04/2020 0954   K 4.7 11/28/2014 1401  CL 96 (L) 08/04/2020 0954   CL 99 (L) 11/28/2014 1401   CO2 23 08/04/2020 0954   CO2 28 11/28/2014 1401   GLUCOSE 260 (H) 08/04/2020 0954   GLUCOSE 215 (H) 11/28/2014 1401   BUN 14 08/04/2020 0954   BUN 16 11/28/2014 1401   CREATININE 0.87 08/04/2020 0954   CREATININE 0.96 11/28/2014 1401   CALCIUM 9.7 08/04/2020 0954   CALCIUM 9.9 11/28/2014 1401   PROT 7.0 08/04/2020 0954   PROT 7.6 11/28/2014 1401   ALBUMIN 3.6 08/04/2020 0954   ALBUMIN 4.5 11/28/2014 1401   AST 30 08/04/2020 0954   AST 26 11/28/2014 1401   ALT 20 08/04/2020 0954   ALT 28 11/28/2014 1401   ALKPHOS 96 08/04/2020 0954   ALKPHOS 78 11/28/2014 1401   BILITOT 0.7 08/04/2020 0954   BILITOT 0.5 11/28/2014 1401   GFRNONAA >60 08/04/2020 0954   GFRNONAA >60 11/28/2014 1401   GFRAA >60 05/30/2020 1022   GFRAA >60 11/28/2014 1401    No results found for: SPEP, UPEP  Lab Results  Component Value Date   WBC 2.9 (L) 08/04/2020   NEUTROABS 1.3 (L) 08/04/2020   HGB 12.5 08/04/2020   HCT 35.9 (L) 08/04/2020   MCV 94.7 08/04/2020   PLT 216 08/04/2020      Chemistry      Component Value Date/Time   NA 132 (L) 08/04/2020 0954   NA 135 11/28/2014 1401   K 3.4 (L) 08/04/2020 0954   K 4.7 11/28/2014 1401   CL 96 (L) 08/04/2020 0954   CL 99 (L) 11/28/2014 1401   CO2 23 08/04/2020 0954   CO2 28 11/28/2014 1401   BUN 14 08/04/2020 0954   BUN 16 11/28/2014 1401   CREATININE 0.87 08/04/2020 0954   CREATININE 0.96 11/28/2014 1401      Component Value Date/Time   CALCIUM 9.7 08/04/2020 0954   CALCIUM 9.9 11/28/2014 1401   ALKPHOS 96 08/04/2020  0954   ALKPHOS 78 11/28/2014 1401   AST 30 08/04/2020 0954   AST 26 11/28/2014 1401   ALT 20 08/04/2020 0954   ALT 28 11/28/2014 1401   BILITOT 0.7 08/04/2020 0954   BILITOT 0.5 11/28/2014 1401       RADIOGRAPHIC STUDIES: I have personally reviewed the radiological images as listed and agreed with the findings in the report. No results found.   ASSESSMENT & PLAN:  Carcinoma of lower-outer quadrant of right breast in female, estrogen receptor positive ([place])  # Recurrent breast cancer-ER positive PR negative ? HER-2/neu-currently on Faslodex plus abema.  AUG 4th 2021- CT A/P- No evidence of any progressive metastatic disease- STABLE   # Continue Faslodex; CONTINUE Abema 100 BID; Labs today-white count-2.9; ANC- 1.3.   #  Poorly controlled BG- 260; recommend better control of BG. STABLE.    # CKD- stage III- monitor closely;Creatinine- 0.8- Improved.     # Rash in pelvic-? Awaiting evaluation with OB- macth 2022; reductant with derm referral. Will give script for mupirocin   #DISPOSITION: # Faslodex/today  # Follow up second week of jan 2022- MD/ labs (CBC/CMP/Ca 27-29) Faslodex;CT C/A/P prior -Dr.B       Orders Placed This Encounter  Procedures  . CT CHEST ABDOMEN PELVIS W CONTRAST    Standing Status:   Future    Standing Expiration Date:   08/04/2021    Order Specific Question:   Preferred imaging location?    Answer:    [facility]    Order Specific Question:  Radiology Contrast Protocol - do NOT remove file path    Answer:   [email or web address]  . CBC with Differential/Platelet    Standing Status:   Future    Standing Expiration Date:   08/04/2021  . Comprehensive metabolic panel    Standing Status:   Future    Standing Expiration Date:   08/04/2021  . Cancer antigen 27.29    Standing Status:   Future    Standing Expiration Date:   08/04/2021   All questions were answered. The patient knows to call the clinic with any  problems, questions or concerns.      [name], MD 08/09/2020 6:21 AM

## 2020-08-05 ENCOUNTER — Other Ambulatory Visit: Payer: Self-pay | Admitting: Internal Medicine

## 2020-08-05 DIAGNOSIS — C50511 Malignant neoplasm of lower-outer quadrant of right female breast: Secondary | ICD-10-CM

## 2020-08-05 DIAGNOSIS — Z170 Estrogen receptor positive status [ER+]: Secondary | ICD-10-CM

## 2020-08-05 LAB — CANCER ANTIGEN 27.29: CA 27.29: 40.6 U/mL — ABNORMAL HIGH (ref 0.0–38.6)

## 2020-08-19 DIAGNOSIS — E1169 Type 2 diabetes mellitus with other specified complication: Secondary | ICD-10-CM | POA: Diagnosis not present

## 2020-08-19 DIAGNOSIS — K760 Fatty (change of) liver, not elsewhere classified: Secondary | ICD-10-CM | POA: Insufficient documentation

## 2020-08-19 DIAGNOSIS — C50919 Malignant neoplasm of unspecified site of unspecified female breast: Secondary | ICD-10-CM | POA: Diagnosis not present

## 2020-08-19 DIAGNOSIS — Z0000 Encounter for general adult medical examination without abnormal findings: Secondary | ICD-10-CM | POA: Diagnosis not present

## 2020-08-19 DIAGNOSIS — Z79899 Other long term (current) drug therapy: Secondary | ICD-10-CM | POA: Diagnosis not present

## 2020-08-19 DIAGNOSIS — N183 Chronic kidney disease, stage 3 unspecified: Secondary | ICD-10-CM | POA: Diagnosis not present

## 2020-08-19 DIAGNOSIS — E1122 Type 2 diabetes mellitus with diabetic chronic kidney disease: Secondary | ICD-10-CM | POA: Insufficient documentation

## 2020-08-19 DIAGNOSIS — I152 Hypertension secondary to endocrine disorders: Secondary | ICD-10-CM | POA: Insufficient documentation

## 2020-08-19 DIAGNOSIS — E039 Hypothyroidism, unspecified: Secondary | ICD-10-CM | POA: Diagnosis not present

## 2020-08-19 DIAGNOSIS — B372 Candidiasis of skin and nail: Secondary | ICD-10-CM | POA: Diagnosis not present

## 2020-08-19 DIAGNOSIS — Z8639 Personal history of other endocrine, nutritional and metabolic disease: Secondary | ICD-10-CM | POA: Diagnosis not present

## 2020-08-19 DIAGNOSIS — E1159 Type 2 diabetes mellitus with other circulatory complications: Secondary | ICD-10-CM | POA: Diagnosis not present

## 2020-08-19 DIAGNOSIS — C50511 Malignant neoplasm of lower-outer quadrant of right female breast: Secondary | ICD-10-CM | POA: Diagnosis not present

## 2020-08-19 DIAGNOSIS — I700 Atherosclerosis of aorta: Secondary | ICD-10-CM | POA: Diagnosis not present

## 2020-08-19 DIAGNOSIS — N182 Chronic kidney disease, stage 2 (mild): Secondary | ICD-10-CM | POA: Insufficient documentation

## 2020-08-26 ENCOUNTER — Other Ambulatory Visit: Payer: Self-pay | Admitting: Internal Medicine

## 2020-08-26 DIAGNOSIS — Z170 Estrogen receptor positive status [ER+]: Secondary | ICD-10-CM

## 2020-09-05 ENCOUNTER — Other Ambulatory Visit: Payer: Self-pay

## 2020-09-05 ENCOUNTER — Encounter: Payer: Self-pay | Admitting: Internal Medicine

## 2020-09-05 ENCOUNTER — Ambulatory Visit
Admission: RE | Admit: 2020-09-05 | Discharge: 2020-09-05 | Disposition: A | Discharge status: Home / Self Care | Payer: PPO | Source: Ambulatory Visit | Attending: Internal Medicine | Admitting: Internal Medicine

## 2020-09-05 DIAGNOSIS — Z8701 Personal history of pneumonia (recurrent): Secondary | ICD-10-CM | POA: Diagnosis not present

## 2020-09-05 DIAGNOSIS — C50511 Malignant neoplasm of lower-outer quadrant of right female breast: Secondary | ICD-10-CM | POA: Insufficient documentation

## 2020-09-05 DIAGNOSIS — Z170 Estrogen receptor positive status [ER+]: Secondary | ICD-10-CM | POA: Insufficient documentation

## 2020-09-05 DIAGNOSIS — C50911 Malignant neoplasm of unspecified site of right female breast: Secondary | ICD-10-CM | POA: Diagnosis not present

## 2020-09-05 DIAGNOSIS — K8020 Calculus of gallbladder without cholecystitis without obstruction: Secondary | ICD-10-CM | POA: Diagnosis not present

## 2020-09-05 DIAGNOSIS — Z5111 Encounter for antineoplastic chemotherapy: Secondary | ICD-10-CM | POA: Diagnosis not present

## 2020-09-05 DIAGNOSIS — K5730 Diverticulosis of large intestine without perforation or abscess without bleeding: Secondary | ICD-10-CM | POA: Diagnosis not present

## 2020-09-05 DIAGNOSIS — I8500 Esophageal varices without bleeding: Secondary | ICD-10-CM | POA: Diagnosis not present

## 2020-09-05 LAB — POCT I-STAT CREATININE: Creatinine, Ser: 1 mg/dL (ref 0.44–1.00)

## 2020-09-05 MED ORDER — IOHEXOL 300 MG/ML  SOLN
100.0000 mL | Freq: Once | INTRAMUSCULAR | Status: AC | PRN
  Administered 2020-09-05: 100 mL via INTRAVENOUS

## 2020-09-08 ENCOUNTER — Inpatient Hospital Stay: Payer: PPO | Attending: Internal Medicine

## 2020-09-08 ENCOUNTER — Inpatient Hospital Stay: Payer: PPO | Admitting: Internal Medicine

## 2020-09-08 ENCOUNTER — Other Ambulatory Visit: Payer: Self-pay | Admitting: *Deleted

## 2020-09-08 ENCOUNTER — Inpatient Hospital Stay: Payer: PPO

## 2020-09-08 DIAGNOSIS — Z803 Family history of malignant neoplasm of breast: Secondary | ICD-10-CM | POA: Diagnosis not present

## 2020-09-08 DIAGNOSIS — C50511 Malignant neoplasm of lower-outer quadrant of right female breast: Secondary | ICD-10-CM

## 2020-09-08 DIAGNOSIS — E119 Type 2 diabetes mellitus without complications: Secondary | ICD-10-CM | POA: Diagnosis not present

## 2020-09-08 DIAGNOSIS — Z79899 Other long term (current) drug therapy: Secondary | ICD-10-CM | POA: Diagnosis not present

## 2020-09-08 DIAGNOSIS — Z9013 Acquired absence of bilateral breasts and nipples: Secondary | ICD-10-CM | POA: Insufficient documentation

## 2020-09-08 DIAGNOSIS — Z170 Estrogen receptor positive status [ER+]: Secondary | ICD-10-CM | POA: Insufficient documentation

## 2020-09-08 DIAGNOSIS — Z87442 Personal history of urinary calculi: Secondary | ICD-10-CM | POA: Diagnosis not present

## 2020-09-08 DIAGNOSIS — Z9221 Personal history of antineoplastic chemotherapy: Secondary | ICD-10-CM | POA: Insufficient documentation

## 2020-09-08 DIAGNOSIS — Z794 Long term (current) use of insulin: Secondary | ICD-10-CM | POA: Insufficient documentation

## 2020-09-08 DIAGNOSIS — C7800 Secondary malignant neoplasm of unspecified lung: Secondary | ICD-10-CM | POA: Diagnosis not present

## 2020-09-08 DIAGNOSIS — Z923 Personal history of irradiation: Secondary | ICD-10-CM | POA: Insufficient documentation

## 2020-09-08 DIAGNOSIS — K760 Fatty (change of) liver, not elsewhere classified: Secondary | ICD-10-CM | POA: Diagnosis not present

## 2020-09-08 DIAGNOSIS — Z5111 Encounter for antineoplastic chemotherapy: Secondary | ICD-10-CM | POA: Insufficient documentation

## 2020-09-08 DIAGNOSIS — Z833 Family history of diabetes mellitus: Secondary | ICD-10-CM | POA: Insufficient documentation

## 2020-09-08 DIAGNOSIS — I10 Essential (primary) hypertension: Secondary | ICD-10-CM | POA: Diagnosis not present

## 2020-09-08 DIAGNOSIS — E7800 Pure hypercholesterolemia, unspecified: Secondary | ICD-10-CM | POA: Diagnosis not present

## 2020-09-08 LAB — COMPREHENSIVE METABOLIC PANEL
ALT: 22 U/L (ref 0–44)
AST: 32 U/L (ref 15–41)
Albumin: 3.6 g/dL (ref 3.5–5.0)
Alkaline Phosphatase: 77 U/L (ref 38–126)
Anion gap: 10 (ref 5–15)
BUN: 17 mg/dL (ref 8–23)
CO2: 25 mmol/L (ref 22–32)
Calcium: 9.4 mg/dL (ref 8.9–10.3)
Chloride: 99 mmol/L (ref 98–111)
Creatinine, Ser: 0.98 mg/dL (ref 0.44–1.00)
GFR, Estimated: >60 mL/min (ref >60)
Glucose, Bld: 196 mg/dL — ABNORMAL HIGH (ref 70–99)
Potassium: 3.9 mmol/L (ref 3.5–5.1)
Sodium: 134 mmol/L — ABNORMAL LOW (ref 135–145)
Total Bilirubin: 0.5 mg/dL (ref 0.3–1.2)
Total Protein: 7.2 g/dL (ref 6.5–8.1)

## 2020-09-08 LAB — CBC WITH DIFFERENTIAL/PLATELET
Abs Immature Granulocytes: 0.01 10*3/uL (ref 0.00–0.07)
Basophils Absolute: 0.1 10*3/uL (ref 0.0–0.1)
Basophils Relative: 3 %
Eosinophils Absolute: 0.1 10*3/uL (ref 0.0–0.5)
Eosinophils Relative: 2 %
HCT: 37.1 % (ref 36.0–46.0)
Hemoglobin: 12.8 g/dL (ref 12.0–15.0)
Immature Granulocytes: 0 %
Lymphocytes Relative: 40 %
Lymphs Abs: 1.5 10*3/uL (ref 0.7–4.0)
MCH: 33.1 pg (ref 26.0–34.0)
MCHC: 34.5 g/dL (ref 30.0–36.0)
MCV: 95.9 fL (ref 80.0–100.0)
Monocytes Absolute: 0.2 10*3/uL (ref 0.1–1.0)
Monocytes Relative: 6 %
Neutro Abs: 1.8 10*3/uL (ref 1.7–7.7)
Neutrophils Relative %: 49 %
Platelets: 243 10*3/uL (ref 150–400)
RBC: 3.87 MIL/uL (ref 3.87–5.11)
RDW: 12.9 % (ref 11.5–15.5)
WBC: 3.7 10*3/uL — ABNORMAL LOW (ref 4.0–10.5)
nRBC: 0 % (ref 0.0–0.2)

## 2020-09-08 IMAGING — CT CT CHEST WITH CONTRAST
2 of 5 series · 13 of 36 positions shown, 16 images · IV contrast (omnipaque)
Comparison: CT CAP 10/20/2018

CLINICAL DATA: Patient with history of breast cancer. Follow-up
exam. Additionally, patient complains of a palpable not within the
left chest wall.

EXAM:
CT CHEST, ABDOMEN, AND PELVIS WITH CONTRAST
TECHNIQUE: Multidetector CT imaging of the chest, abdomen and pelvis was
performed following the standard protocol during bolus
administration of intravenous contrast.
CONTRAST:  100mL OMNIPAQUE IOHEXOL 300 MG/ML  SOLN

[Series 2: cap with · axial · 0.77mm/px · z∈[-956,-416]mm · 10 of 132 slices shown, 13 images]
[im 12/132  mediastinal]
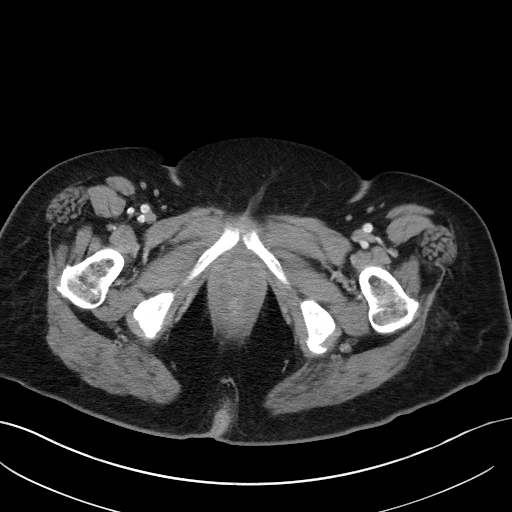
[im 12/132  lung]
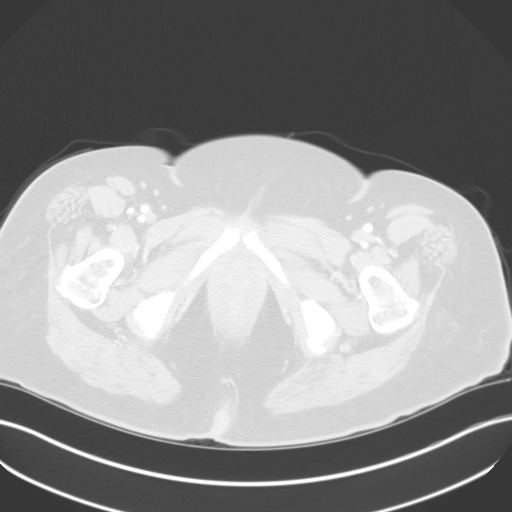
[im 24/132  lung]
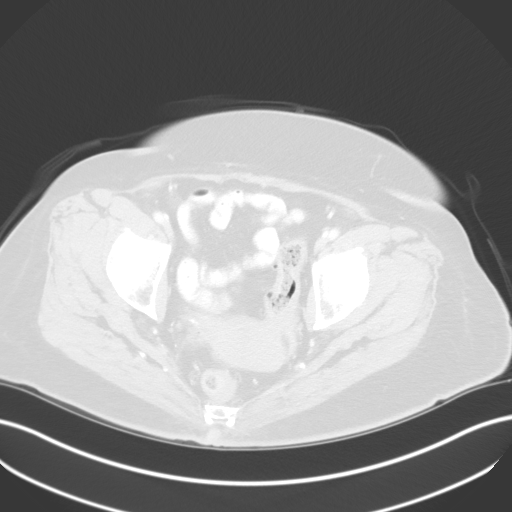
[im 36/132  lung]
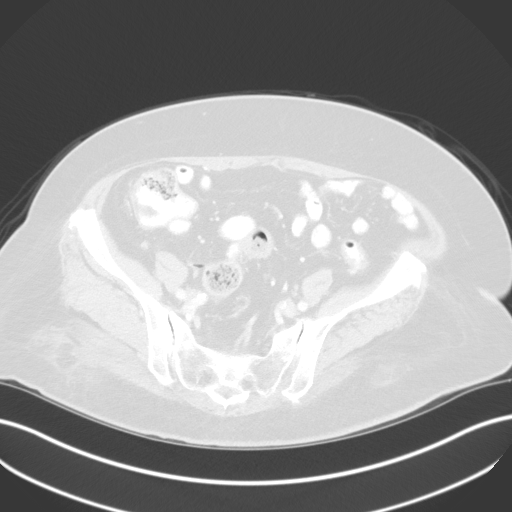
[im 48/132  lung]
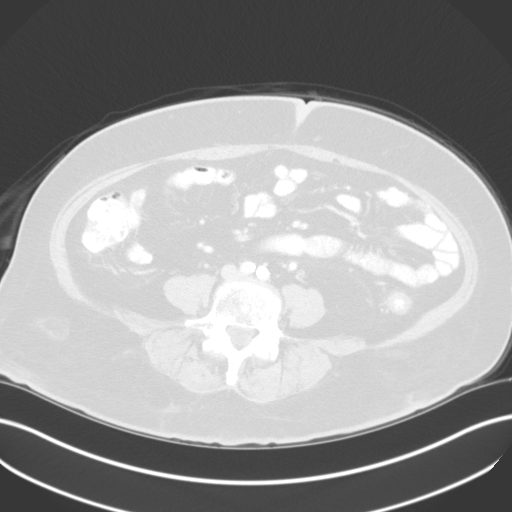
[im 60/132  mediastinal]
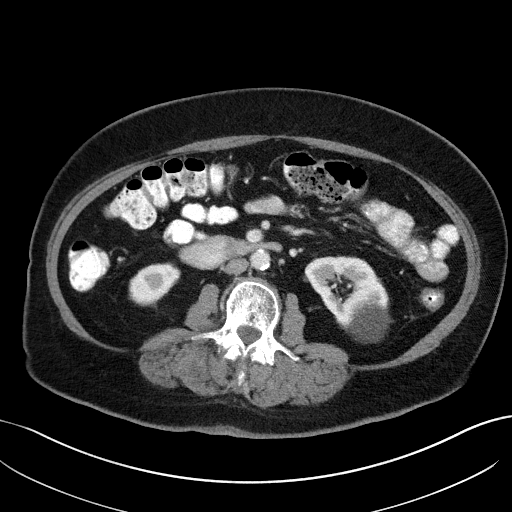
[im 60/132  lung]
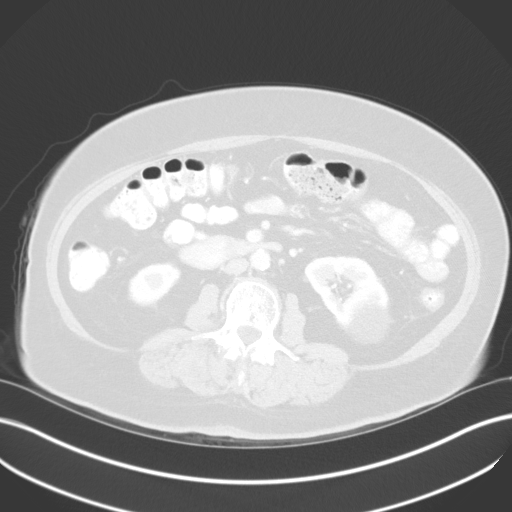
[im 72/132  lung]
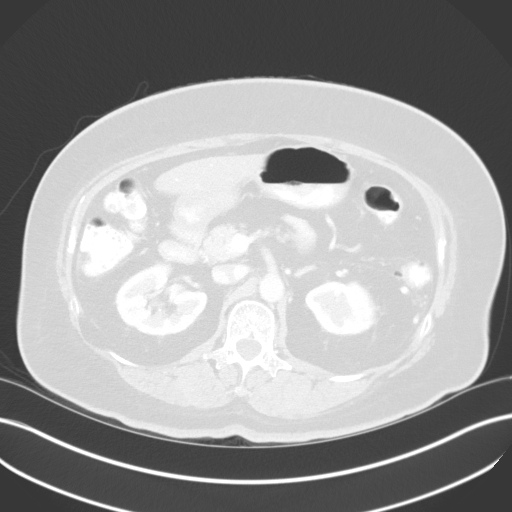
[im 84/132  lung]
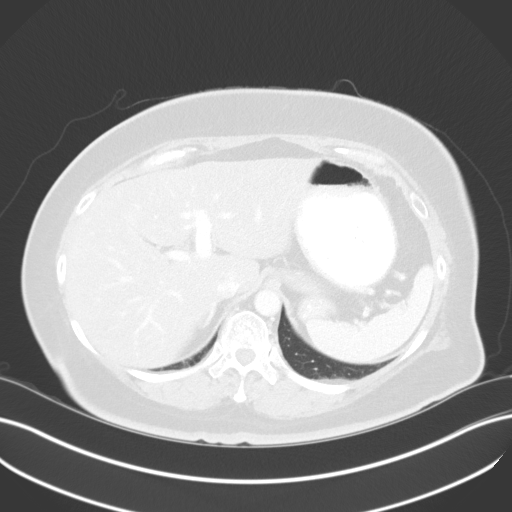
[im 96/132  lung]
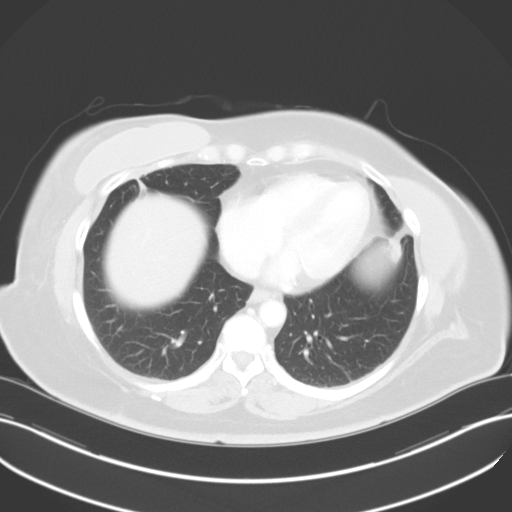
[im 108/132  mediastinal]
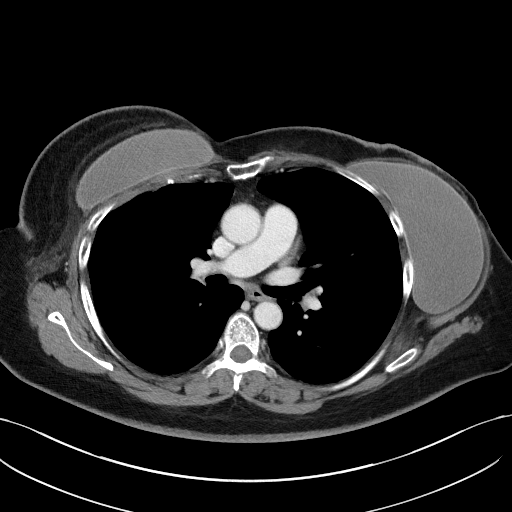
[im 108/132  lung]
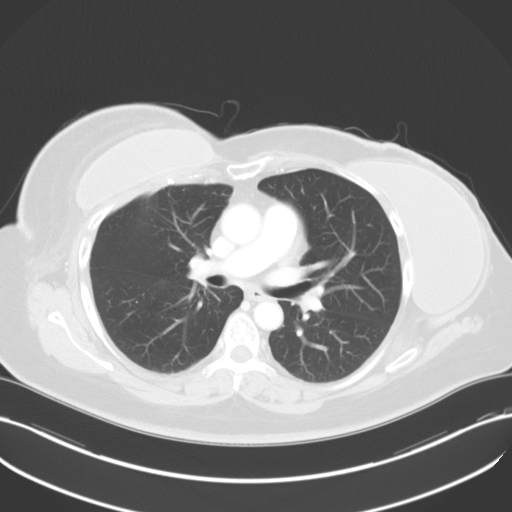
[im 120/132  lung]
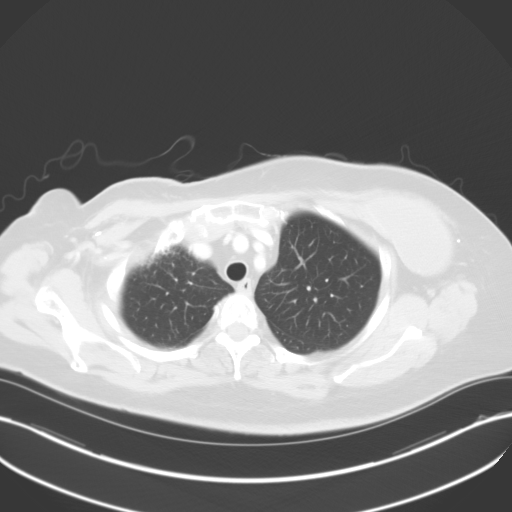

[Series 5: coronals · coronal · 0.94mm/px · 3 of 135 slices shown]
[im 27/135  lung]
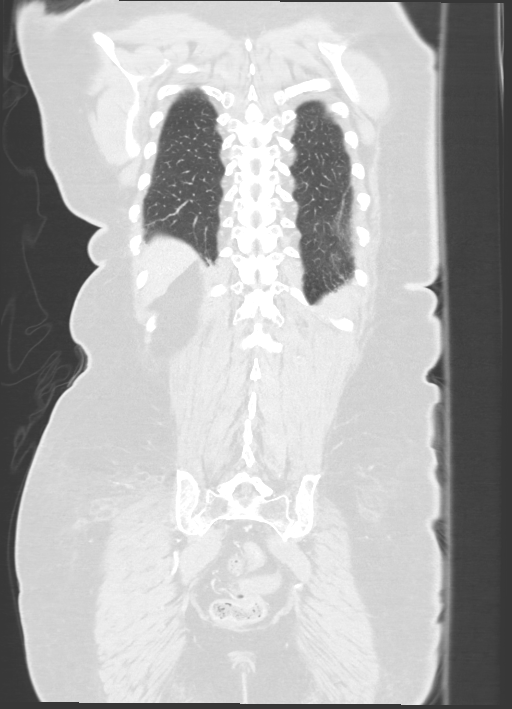
[im 54/135  lung]
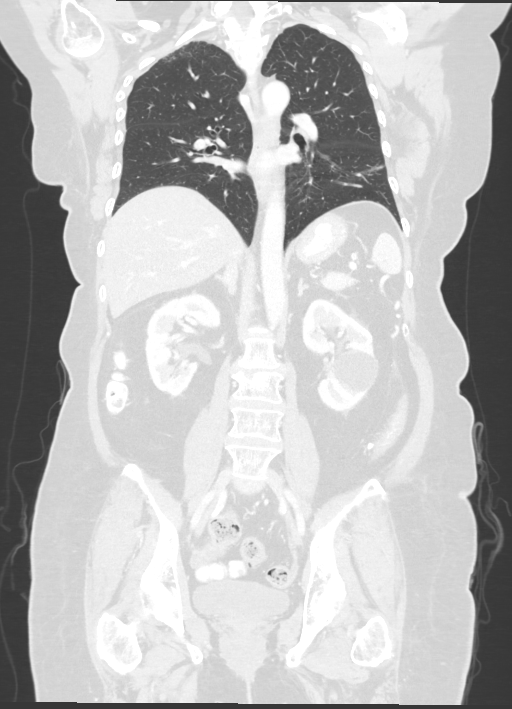
[im 81/135  lung]
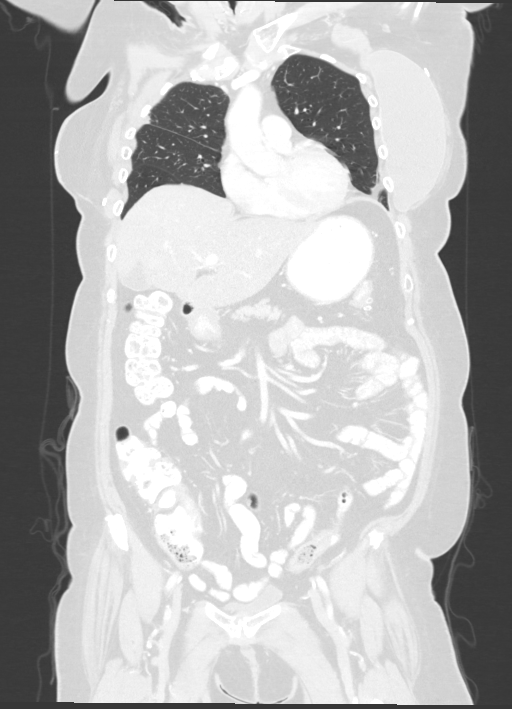

[13 of 36 positions shown; findings below may reference images not displayed]

FINDINGS: CT CHEST FINDINGS

Cardiovascular: Normal heart size. Coronary arterial vascular
calcifications. Trace fluid superior pericardial recess.

Mediastinum/Nodes: Postsurgical changes right axilla. No enlarged
axillary, mediastinal or hilar lymphadenopathy. Normal appearance of
the esophagus.

Lungs/Pleura: Central airways are patent. Dependent atelectasis
within the left lower lobe. Stable scarring within the lingula.
Postradiation changes within the anterior right upper lobe and right
middle lobe. No pleural effusion or pneumothorax.

Musculoskeletal: Midthoracic spine degenerative changes. No
aggressive or acute appearing osseous lesions. Palpable marker is
demonstrated overlying the left chest wall anteriorly. Just deep to
the palpable marker is a 1.2 cm soft tissue mass (image 22; series
2).

CT ABDOMEN PELVIS FINDINGS

Hepatobiliary: The liver is normal in size and contour. No focal
hepatic lesion is identified. Small stones within the gallbladder
lumen. No gallbladder wall thickening or pericholecystic fluid. No
intrahepatic or extrahepatic biliary ductal dilatation.

Pancreas: Unremarkable

Spleen: Unremarkable

Adrenals/Urinary Tract: Normal adrenal glands. Kidneys enhance
symmetrically with contrast. Similar-appearing complex cyst within
the inferior pole of the left kidney measuring 4.9 cm (image 69;
series 2). No hydronephrosis. Urinary bladder is unremarkable.
Punctate 3 mm stone superior pole right kidney (image 92; series 5).

Stomach/Bowel: Sigmoid colonic diverticulosis. No CT evidence for
acute diverticulitis. No evidence for small bowel obstruction.
Normal appendix. No free fluid or free intraperitoneal air. Normal
morphology of the stomach.

Vascular/Lymphatic: Normal caliber abdominal aorta. Peripheral
calcified atherosclerotic plaque. No retroperitoneal
lymphadenopathy.

Reproductive: Uterus and adnexal structures are unremarkable.

Other: None.

Musculoskeletal: Stable subcutaneous fat stranding overlying the
flanks bilaterally. Lumbar spine degenerative changes. No aggressive
or acute appearing osseous lesions. Stable superior endplate wedge
compression deformity of the L3 vertebral body.
IMPRESSION: 1. Underlying the palpable marker within the anterior chest wall is
a 1.2 cm soft tissue nodule. This is nonspecific in etiology.
Recommend dedicated evaluation with ultrasound at the [REDACTED].
2. Otherwise no evidence for localized recurrent disease or
metastatic disease within the chest, abdomen or pelvis.

## 2020-09-08 MED ORDER — FULVESTRANT 250 MG/5ML IM SOLN
500.0000 mg | Freq: Once | INTRAMUSCULAR | Status: AC
  Administered 2020-09-08: 500 mg via INTRAMUSCULAR
  Filled 2020-09-08: qty 10

## 2020-09-08 NOTE — Progress Notes (Signed)
.[name] OFFICE PROGRESS NOTE  Patient Care Team: Patient, No Pcp Per as PCP - General (General Practice) Neldon Mc, MD (General Surgery) Everlene Farrier, MD (Obstetrics and Gynecology) Noreene Filbert, MD Forest Gleason, MD (Inactive) (Unknown Physician Specialty) [name], MD as Consulting Physician (Hematology and Oncology)  Cancer Staging Carcinoma of lower-outer quadrant of right breast in female, estrogen receptor positive [facility]) Staging form: Breast, AJCC 7th Edition - Pathologic: ypT1c,ypN2a, MX - Signed by Haywood Lasso, MD on 03/10/2012    Oncology History Overview Note  # DEC 2012- RIGHT BREAST CA T2 N2 M0 tumor from biopsy.  Estrogen receptor positive, Progesterone receptor positive.  Current receptor negative by FISH 2. Neoadjuvant chemotherapy started in December of 28 with Cytoxan Adriamycin 3. Started on Taxol weekly chemotherapy. 4. Patient finished 12  cycles of Taxol chemotherapy in May of 2013.     5. Status post right modified radical mastectomy [Dr.Bowers; [place]] June of 2013, ypT1c  yp N2  MO. started also on Lerazole    7. Radiation therapy to the right breast (September of 2013).  Lymph node was positive for HER-2 receptor gene amplification of 2.22.  Will proceed with Herceptin treatment starting in September of 2013.   8.Patient has finished Herceptin (maintenance therapy) in August of 2014 8. Start patient on letrozole from November, 2013. 9. Patient started on Herceptin in September 2013.    10Patient finished 12 months of Herceptin therapy on August, 2014  # 6. Status post left side prophylactic mastectomy.  #Late MAY 2018-RECURRENCE BREAST CA- ER positive/PR negative; ?? HER-2/neu- [biopsy- proven-mediastinal lymph node; in suff for her 2 testing].  [elevated Tumor marker- CT/PET- uptake in Right Mediastinal LN; Sternum [June 2018 EBUS- Dr.Kasa]   # March 10 2017- faslodex + Abema; OCT 5th CT-PR of mediastinal  LN [consent]  #August 2020-left chest wall nodule biopsy benign  #August 2019- DVT-right upper extremity; Eliquis; stop end of March 2021 [repeat ultrasound negative/patient preference] --------------------------------------------    DIAGNOSIS: [ BREAST CANCER- ER/PR/HER2 NEU POS  STAGE:  4   ;GOALS: PALLIATIVE  CURRENT/MOST RECENT THERAPY - ABEMA + FASLODEX    Carcinoma of lower-outer quadrant of right breast in female, estrogen receptor positive ([place])   INTERVAL HISTORY:  Kimberly [name] 65 y.o.  female pleasant patient above history of metastatic ER PR positive HER-2/neu positive breast cancer currently on abema+ Faslodex is here for follow-up/review of the CT scan.  No nausea no vomiting or any abdominal pain.  No chest pain.  No headaches.  Denies any new lumps or bumps.  Review of Systems  Constitutional: Positive for malaise/fatigue. Negative for chills, diaphoresis, fever and weight loss.  HENT: Negative for nosebleeds and sore throat.   Eyes: Negative for double vision.  Respiratory: Negative for cough, hemoptysis, sputum production, shortness of breath and wheezing.   Cardiovascular: Negative for chest pain, palpitations, orthopnea and leg swelling.  Gastrointestinal: Negative for abdominal pain, blood in stool, constipation, heartburn, melena, nausea and vomiting.  Musculoskeletal: Positive for back pain and joint pain.  Neurological: Negative for dizziness, tingling, focal weakness, weakness and headaches.  Endo/Heme/Allergies: Does not bruise/bleed easily.  Psychiatric/Behavioral: Negative for depression. The patient is not nervous/anxious and does not have insomnia.       PAST MEDICAL HISTORY :  Past Medical History:  Diagnosis Date  . Arthritis   . Cancer of lower-outer quadrant of female breast ([place]) 08/06/2011   RIGHT, chemo and bilateral mastectomies.  . High cholesterol   .  History of kidney stones   . Hx of bilateral breast implants   . Hypertension    . Lung metastases ([place]) 20118   chemo pills and faslidex shots.  Marland Kitchen PONV (postoperative nausea and vomiting)   . Type II diabetes mellitus ([facility])    fasting 140-150  . Vertigo    LAST WEEK  . Vertigo 01/2017    PAST SURGICAL HISTORY :   Past Surgical History:  Procedure Laterality Date  . BREAST BIOPSY  07/2011, 2020   right  . BREAST RECONSTRUCTION  03/07/2012   Procedure: BREAST RECONSTRUCTION;  Surgeon: Crissie Reese, MD;  Location: [place];  Service: Plastics;  Laterality: Left;  BREAST RECONSTRUCTION WITH PLACEMENT OF TISSUE EXPANDER TO LEFT BREAST  . BREAST RECONSTRUCTION  02/06/2013  . CESAREAN SECTION  [id number]  . ENDOBRONCHIAL ULTRASOUND N/A 02/24/2017   Procedure: ENDOBRONCHIAL ULTRASOUND;  Surgeon: [name], MD;  Location: [facility];  Service: Cardiopulmonary;  Laterality: N/A;  . LATISSIMUS FLAP TO BREAST Right 02/06/2013   Procedure: LATISSIMUS FLAP TO RIGHT BREAST WITH IMPLANT;  Surgeon: Crissie Reese, MD;  Location: [place];  Service: Plastics;  Laterality: Right;  . MASTECTOMY  03/07/12   modified right; total left  . MODIFIED MASTECTOMY  03/07/2012   Procedure: MODIFIED MASTECTOMY;  Surgeon: Haywood Lasso, MD;  Location: [place];  Service: General;  Laterality: Right;  . PORTACATH PLACEMENT  07/2011  . SCAR REVISION  03/30/2012   Procedure: SCAR REVISION;  Surgeon: Haywood Lasso, MD;  Location: [place];  Service: General;  Laterality: Right;  CLOSURE OF MASTECTOMY INCISION  . WISDOM TOOTH EXTRACTION      FAMILY HISTORY :   Family History  Problem Relation Age of Onset  . Breast cancer Mother   . Diabetes Father     SOCIAL HISTORY:   Social History   Tobacco Use  . Smoking status: Never Smoker  . Smokeless tobacco: Never Used  Vaping Use  . Vaping Use: Never used  Substance Use Topics  . Alcohol use: No  . Drug use: No    ALLERGIES:  is allergic to sulfa antibiotics.  MEDICATIONS:  Current Outpatient Medications  Medication Sig  Dispense Refill  . atorvastatin (LIPITOR) 20 MG tablet Take 1 tablet (20 mg total) by mouth daily. 90 tablet 3  . Calcium Carbonate-Vitamin D (CALCIUM 600 + D PO) Take 1 tablet by mouth 2 (two) times daily.    . fulvestrant (FASLODEX) 250 MG/5ML injection Inject into the muscle every 30 (thirty) days. One injection each buttock over 1-2 minutes. Warm prior to use.    Marland Kitchen glipiZIDE (GLUCOTROL XL) 10 MG 24 hr tablet Take 1 tablet (10 mg total) by mouth 2 (two) times daily. 180 tablet 3  . insulin aspart (NOVOLOG FLEXPEN) 100 UNIT/ML FlexPen Inject 4 Units into the skin 3 (three) times daily with meals. 15 mL 0  . Insulin Detemir (LEVEMIR) 100 UNIT/ML Pen Inject 20 Units into the skin at bedtime. 15 mL 0  . levothyroxine (SYNTHROID) 25 MCG tablet Take 1 tablet (25 mcg total) by mouth daily before breakfast. 90 tablet 1  . lisinopril (ZESTRIL) 10 MG tablet Take 10 mg by mouth daily.    Marland Kitchen loperamide (IMODIUM A-D) 2 MG tablet Take 2 mg by mouth 4 (four) times daily as needed for diarrhea or loose stools.    . mupirocin cream (BACTROBAN) 2 % Apply 1 application topically 2 (two) times daily. 15 g 0  . mupirocin ointment (BACTROBAN)  2 % mupirocin 2 % topical ointment  APPLY 1 APPLICATION TOPICALLY 3 (THREE) TIMES DAILY.    Marland Kitchen VERZENIO 100 MG tablet TAKE 1 TABLET BY MOUTH TWICE DAILY 56 tablet 0  . betamethasone valerate (VALISONE) 0.1 % cream Apply 0.1 application topically as needed. (Patient not taking: No sig reported)    . ondansetron (ZOFRAN) 8 MG tablet Take 1 tablet (8 mg total) by mouth every 8 (eight) hours as needed for nausea or vomiting. (Patient not taking: No sig reported) 20 tablet 3  . ondansetron (ZOFRAN-ODT) 4 MG disintegrating tablet Take 1 tablet (4 mg total) by mouth every 8 (eight) hours as needed for nausea or vomiting. (Patient not taking: No sig reported) 20 tablet 3  . prochlorperazine (COMPAZINE) 10 MG tablet Take 1 tablet (10 mg total) by mouth every 6 (six) hours as needed for  nausea or vomiting. (Patient not taking: No sig reported) 30 tablet 0  . promethazine (PHENERGAN) 25 MG tablet Take 1 tablet (25 mg total) by mouth every 6 (six) hours as needed for refractory nausea / vomiting. (Patient not taking: No sig reported) 30 tablet 0  . terbinafine (LAMISIL) 250 MG tablet Take 1 tablet (250 mg total) by mouth daily. (Patient not taking: No sig reported) 14 tablet 0   No current facility-administered medications for this visit.   Facility-Administered Medications Ordered in Other Visits  Medication Dose Route Frequency Provider Last Rate Last Admin  . fulvestrant (FASLODEX) injection 500 mg  500 mg Intramuscular Once [name], MD        PHYSICAL EXAMINATION: ECOG PERFORMANCE STATUS: 1 - Symptomatic but completely ambulatory  BP 139/80   Pulse (!) 107   Temp 98.3 F (36.8 C) (Oral)   Resp 18   Wt 169 lb (76.7 kg)   SpO2 99%   BMI 30.91 kg/m   Filed Weights   09/08/20 0954  Weight: 169 lb (76.7 kg)    Physical Exam Constitutional:      Comments: She is alone.   HENT:     Head: Normocephalic and atraumatic.     Mouth/Throat:     Pharynx: No oropharyngeal exudate.  Eyes:     Pupils: Pupils are equal, round, and reactive to light.  Cardiovascular:     Rate and Rhythm: Normal rate and regular rhythm.  Pulmonary:     Effort: Pulmonary effort is normal. No respiratory distress.     Breath sounds: Normal breath sounds. No wheezing.  Abdominal:     General: Bowel sounds are normal. There is no distension.     Palpations: Abdomen is soft. There is no mass.     Tenderness: There is no abdominal tenderness. There is no guarding or rebound.  Musculoskeletal:        General: No tenderness. Normal range of motion.     Cervical back: Normal range of motion and neck supple.     Comments: Approximately 1 cm hard nodule felt in the anterior chest wall/left parasternal.  Skin:    General: Skin is warm.     Comments: Maculopapular rash in the  left inguinal region/fold.  Neurological:     Mental Status: She is alert and oriented to person, place, and time.  Psychiatric:        Mood and Affect: Affect normal.     LABORATORY DATA:  I have reviewed the data as listed    Component Value Date/Time   NA 134 (L) 09/08/2020 0930   NA 135 11/28/2014 1401  K 3.9 09/08/2020 0930   K 4.7 11/28/2014 1401   CL 99 09/08/2020 0930   CL 99 (L) 11/28/2014 1401   CO2 25 09/08/2020 0930   CO2 28 11/28/2014 1401   GLUCOSE 196 (H) 09/08/2020 0930   GLUCOSE 215 (H) 11/28/2014 1401   BUN 17 09/08/2020 0930   BUN 16 11/28/2014 1401   CREATININE 0.98 09/08/2020 0930   CREATININE 0.96 11/28/2014 1401   CALCIUM 9.4 09/08/2020 0930   CALCIUM 9.9 11/28/2014 1401   PROT 7.2 09/08/2020 0930   PROT 7.6 11/28/2014 1401   ALBUMIN 3.6 09/08/2020 0930   ALBUMIN 4.5 11/28/2014 1401   AST 32 09/08/2020 0930   AST 26 11/28/2014 1401   ALT 22 09/08/2020 0930   ALT 28 11/28/2014 1401   ALKPHOS 77 09/08/2020 0930   ALKPHOS 78 11/28/2014 1401   BILITOT 0.5 09/08/2020 0930   BILITOT 0.5 11/28/2014 1401   GFRNONAA >60 09/08/2020 0930   GFRNONAA >60 11/28/2014 1401   GFRAA >60 05/30/2020 1022   GFRAA >60 11/28/2014 1401    No results found for: SPEP, UPEP  Lab Results  Component Value Date   WBC 3.7 (L) 09/08/2020   NEUTROABS 1.8 09/08/2020   HGB 12.8 09/08/2020   HCT 37.1 09/08/2020   MCV 95.9 09/08/2020   PLT 243 09/08/2020      Chemistry      Component Value Date/Time   NA 134 (L) 09/08/2020 0930   NA 135 11/28/2014 1401   K 3.9 09/08/2020 0930   K 4.7 11/28/2014 1401   CL 99 09/08/2020 0930   CL 99 (L) 11/28/2014 1401   CO2 25 09/08/2020 0930   CO2 28 11/28/2014 1401   BUN 17 09/08/2020 0930   BUN 16 11/28/2014 1401   CREATININE 0.98 09/08/2020 0930   CREATININE 0.96 11/28/2014 1401      Component Value Date/Time   CALCIUM 9.4 09/08/2020 0930   CALCIUM 9.9 11/28/2014 1401   ALKPHOS 77 09/08/2020 0930   ALKPHOS 78  11/28/2014 1401   AST 32 09/08/2020 0930   AST 26 11/28/2014 1401   ALT 22 09/08/2020 0930   ALT 28 11/28/2014 1401   BILITOT 0.5 09/08/2020 0930   BILITOT 0.5 11/28/2014 1401       RADIOGRAPHIC STUDIES: I have personally reviewed the radiological images as listed and agreed with the findings in the report. No results found.   ASSESSMENT & PLAN:  Carcinoma of lower-outer quadrant of right breast in female, estrogen receptor positive ([place])  # Recurrent breast cancer-ER positive PR negative ? HER-2/neu-currently on Faslodex plus abema.  JAN 5th 2022-  No evidence of metastatic disease in the chest, abdomen or pelvis;  Suggestion of fine liver surface irregularity, cannot exclude cirrhosis. No liver masses. Small lower thoracic and mild paraumbilical esophageal varices. Normal size spleen. No ascites.  # Continue Faslodex; CONTINUE Abema 100 BID; Labs today- ANC- 1.8   # Cirrhosis- ? On CT scan- will get liver MRI; likely fatty liver.  Check hepatitis panel. Hold of liver biopsy/ GI eval.   #  Poorly controlled BG- 176; recommend better control of BG. STABLE.    #DISPOSITION: # Faslodex/today  # Follow up in 4 weeks- - MD/ labs (CBC/CMP/Ca 27-29) Faslodex;MRI liver prior-Dr.B   # I reviewed the blood work- with the patient in detail; also reviewed the imaging independently [as summarized above]; and with the patient in detail.      Orders Placed This Encounter  Procedures  .  MR LIVER W WO CONTRAST    Standing Status:   Future    Standing Expiration Date:   09/08/2021    Order Specific Question:   If indicated for the ordered procedure, I authorize the administration of contrast media per Radiology protocol    Answer:   Yes    Order Specific Question:   What is the patient's sedation requirement?    Answer:   No Sedation    Order Specific Question:   Does the patient have a pacemaker or implanted devices?    Answer:   No    Order Specific Question:   Preferred imaging  location?    Answer:   [facility] (table limit - 550lbs)  . Hepatitis B surface antibody,quantitative    Standing Status:   Future    Standing Expiration Date:   09/08/2021  . Hepatitis C antibody    Standing Status:   Future    Standing Expiration Date:   09/08/2021   All questions were answered. The patient knows to call the clinic with any problems, questions or concerns.      [name], MD 09/08/2020 10:19 AM

## 2020-09-08 NOTE — Assessment & Plan Note (Addendum)
#  Recurrent breast cancer-ER positive PR negative ? HER-2/neu-currently on Faslodex plus abema.  JAN 5th 2022-  No evidence of metastatic disease in the chest, abdomen or pelvis;  Suggestion of fine liver surface irregularity, cannot exclude cirrhosis. No liver masses. Small lower thoracic and mild paraumbilical esophageal varices. Normal size spleen. No ascites.  # Continue Faslodex; CONTINUE Abema 100 BID; Labs today- ANC- 1.8   # Cirrhosis- ? On CT scan- will get liver MRI; likely fatty liver.  Check hepatitis panel. Hold of liver biopsy/ GI eval.   #  Poorly controlled BG- 176; recommend better control of BG. STABLE.    #DISPOSITION: # Faslodex/today  # Follow up in 4 weeks- - MD/ labs (CBC/CMP/Ca 27-29) Faslodex;MRI liver prior-Dr.B   # I reviewed the blood work- with the patient in detail; also reviewed the imaging independently [as summarized above]; and with the patient in detail.

## 2020-09-09 DIAGNOSIS — E1169 Type 2 diabetes mellitus with other specified complication: Secondary | ICD-10-CM | POA: Diagnosis not present

## 2020-09-09 DIAGNOSIS — E782 Mixed hyperlipidemia: Secondary | ICD-10-CM | POA: Diagnosis not present

## 2020-09-09 DIAGNOSIS — Z794 Long term (current) use of insulin: Secondary | ICD-10-CM | POA: Diagnosis not present

## 2020-09-09 DIAGNOSIS — E1122 Type 2 diabetes mellitus with diabetic chronic kidney disease: Secondary | ICD-10-CM | POA: Diagnosis not present

## 2020-09-09 DIAGNOSIS — N1831 Chronic kidney disease, stage 3a: Secondary | ICD-10-CM | POA: Diagnosis not present

## 2020-09-09 DIAGNOSIS — E039 Hypothyroidism, unspecified: Secondary | ICD-10-CM | POA: Diagnosis not present

## 2020-09-09 LAB — CANCER ANTIGEN 27.29: CA 27.29: 31.7 U/mL (ref 0.0–38.6)

## 2020-09-18 ENCOUNTER — Other Ambulatory Visit: Payer: Self-pay | Admitting: Nurse Practitioner

## 2020-09-18 DIAGNOSIS — Z170 Estrogen receptor positive status [ER+]: Secondary | ICD-10-CM

## 2020-09-21 ENCOUNTER — Other Ambulatory Visit: Payer: Self-pay | Admitting: Nurse Practitioner

## 2020-09-21 DIAGNOSIS — C50511 Malignant neoplasm of lower-outer quadrant of right female breast: Secondary | ICD-10-CM

## 2020-09-21 DIAGNOSIS — Z170 Estrogen receptor positive status [ER+]: Secondary | ICD-10-CM

## 2020-10-02 NOTE — Progress Notes (Signed)
Faslodex is approved thru [place] for patient assistance medication. Effect 09/22/2020 to 08/29/2021.  Assist required to be on hand for appt.   Madalyn Rob, CPhT IV Drug Replacement Specialist  [place] Phone: [phone]

## 2020-10-03 ENCOUNTER — Ambulatory Visit
Admission: RE | Admit: 2020-10-03 | Discharge: 2020-10-03 | Disposition: A | Discharge status: Home / Self Care | Payer: PPO | Source: Ambulatory Visit | Attending: Internal Medicine | Admitting: Internal Medicine

## 2020-10-03 ENCOUNTER — Other Ambulatory Visit: Payer: Self-pay

## 2020-10-03 DIAGNOSIS — K760 Fatty (change of) liver, not elsewhere classified: Secondary | ICD-10-CM | POA: Insufficient documentation

## 2020-10-03 DIAGNOSIS — Z170 Estrogen receptor positive status [ER+]: Secondary | ICD-10-CM | POA: Diagnosis not present

## 2020-10-03 DIAGNOSIS — C50511 Malignant neoplasm of lower-outer quadrant of right female breast: Secondary | ICD-10-CM | POA: Diagnosis not present

## 2020-10-03 DIAGNOSIS — N281 Cyst of kidney, acquired: Secondary | ICD-10-CM | POA: Diagnosis not present

## 2020-10-03 DIAGNOSIS — R160 Hepatomegaly, not elsewhere classified: Secondary | ICD-10-CM | POA: Diagnosis not present

## 2020-10-03 DIAGNOSIS — K7689 Other specified diseases of liver: Secondary | ICD-10-CM | POA: Diagnosis not present

## 2020-10-03 DIAGNOSIS — K8020 Calculus of gallbladder without cholecystitis without obstruction: Secondary | ICD-10-CM | POA: Diagnosis not present

## 2020-10-03 MED ORDER — GADOBUTROL 1 MMOL/ML IV SOLN
7.5000 mL | Freq: Once | INTRAVENOUS | Status: AC | PRN
  Administered 2020-10-03: 7.5 mL via INTRAVENOUS

## 2020-10-06 ENCOUNTER — Inpatient Hospital Stay: Payer: PPO | Attending: Internal Medicine

## 2020-10-06 ENCOUNTER — Inpatient Hospital Stay: Payer: PPO

## 2020-10-06 ENCOUNTER — Other Ambulatory Visit: Payer: Self-pay

## 2020-10-06 ENCOUNTER — Inpatient Hospital Stay: Payer: PPO | Admitting: Internal Medicine

## 2020-10-06 DIAGNOSIS — Z170 Estrogen receptor positive status [ER+]: Secondary | ICD-10-CM

## 2020-10-06 DIAGNOSIS — Z87442 Personal history of urinary calculi: Secondary | ICD-10-CM | POA: Insufficient documentation

## 2020-10-06 DIAGNOSIS — C50511 Malignant neoplasm of lower-outer quadrant of right female breast: Secondary | ICD-10-CM | POA: Insufficient documentation

## 2020-10-06 DIAGNOSIS — E119 Type 2 diabetes mellitus without complications: Secondary | ICD-10-CM | POA: Diagnosis not present

## 2020-10-06 DIAGNOSIS — K760 Fatty (change of) liver, not elsewhere classified: Secondary | ICD-10-CM

## 2020-10-06 DIAGNOSIS — E7800 Pure hypercholesterolemia, unspecified: Secondary | ICD-10-CM | POA: Insufficient documentation

## 2020-10-06 DIAGNOSIS — Z794 Long term (current) use of insulin: Secondary | ICD-10-CM | POA: Diagnosis not present

## 2020-10-06 DIAGNOSIS — Z833 Family history of diabetes mellitus: Secondary | ICD-10-CM | POA: Diagnosis not present

## 2020-10-06 DIAGNOSIS — Z9013 Acquired absence of bilateral breasts and nipples: Secondary | ICD-10-CM | POA: Insufficient documentation

## 2020-10-06 DIAGNOSIS — Z79899 Other long term (current) drug therapy: Secondary | ICD-10-CM | POA: Insufficient documentation

## 2020-10-06 DIAGNOSIS — Z5111 Encounter for antineoplastic chemotherapy: Secondary | ICD-10-CM | POA: Insufficient documentation

## 2020-10-06 DIAGNOSIS — Z803 Family history of malignant neoplasm of breast: Secondary | ICD-10-CM | POA: Insufficient documentation

## 2020-10-06 DIAGNOSIS — I10 Essential (primary) hypertension: Secondary | ICD-10-CM | POA: Diagnosis not present

## 2020-10-06 LAB — CBC WITH DIFFERENTIAL/PLATELET
Abs Immature Granulocytes: 0.01 10*3/uL (ref 0.00–0.07)
Basophils Absolute: 0.1 10*3/uL (ref 0.0–0.1)
Basophils Relative: 2 %
Eosinophils Absolute: 0.1 10*3/uL (ref 0.0–0.5)
Eosinophils Relative: 2 %
HCT: 35.1 % — ABNORMAL LOW (ref 36.0–46.0)
Hemoglobin: 12 g/dL (ref 12.0–15.0)
Immature Granulocytes: 0 %
Lymphocytes Relative: 47 %
Lymphs Abs: 1.8 10*3/uL (ref 0.7–4.0)
MCH: 32.6 pg (ref 26.0–34.0)
MCHC: 34.2 g/dL (ref 30.0–36.0)
MCV: 95.4 fL (ref 80.0–100.0)
Monocytes Absolute: 0.2 10*3/uL (ref 0.1–1.0)
Monocytes Relative: 6 %
Neutro Abs: 1.6 10*3/uL — ABNORMAL LOW (ref 1.7–7.7)
Neutrophils Relative %: 43 %
Platelets: 266 10*3/uL (ref 150–400)
RBC: 3.68 MIL/uL — ABNORMAL LOW (ref 3.87–5.11)
RDW: 13.1 % (ref 11.5–15.5)
WBC: 3.8 10*3/uL — ABNORMAL LOW (ref 4.0–10.5)
nRBC: 0 % (ref 0.0–0.2)

## 2020-10-06 LAB — HEPATITIS C ANTIBODY: HCV Ab: NONREACTIVE

## 2020-10-06 LAB — COMPREHENSIVE METABOLIC PANEL
ALT: 20 U/L (ref 0–44)
AST: 30 U/L (ref 15–41)
Albumin: 3.6 g/dL (ref 3.5–5.0)
Alkaline Phosphatase: 78 U/L (ref 38–126)
Anion gap: 11 (ref 5–15)
BUN: 19 mg/dL (ref 8–23)
CO2: 24 mmol/L (ref 22–32)
Calcium: 9.7 mg/dL (ref 8.9–10.3)
Chloride: 100 mmol/L (ref 98–111)
Creatinine, Ser: 0.99 mg/dL (ref 0.44–1.00)
GFR, Estimated: >60 mL/min (ref >60)
Glucose, Bld: 173 mg/dL — ABNORMAL HIGH (ref 70–99)
Potassium: 3.9 mmol/L (ref 3.5–5.1)
Sodium: 135 mmol/L (ref 135–145)
Total Bilirubin: 0.3 mg/dL (ref 0.3–1.2)
Total Protein: 7.6 g/dL (ref 6.5–8.1)

## 2020-10-06 MED ORDER — FULVESTRANT 250 MG/5ML IM SOLN
500.0000 mg | Freq: Once | INTRAMUSCULAR | Status: AC
Start: 2020-10-06 — End: 2020-10-06
  Administered 2020-10-06: 500 mg via INTRAMUSCULAR
  Filled 2020-10-06: qty 10

## 2020-10-06 NOTE — Assessment & Plan Note (Addendum)
#  Recurrent breast cancer-ER positive PR negative ? HER-2/neu-currently on Faslodex plus abema.  JAN 5th 2022-  No evidence of metastatic disease in the chest, abdomen or pelvis-STABLE.  # Continue Faslodex; CONTINUE Abema 100 BID; Labs today- ANC- 1.7;   # Cirrhosis- ? On CT scan-MRI liver- Negative for any overt cirrhosis. No further work up is recommended.    #  Poorly controlled BG- 176; recommend better control of BG. STABLE.    #DISPOSITION: # Faslodex/today  # Follow up in 4 weeks- - MD/ labs (CBC/CMP/Ca 27-29) Faslodex;-Dr.B   # I reviewed the blood work- with the patient in detail; also reviewed the imaging independently [as summarized above]; and with the patient in detail.

## 2020-10-06 NOTE — Progress Notes (Signed)
.[place] OFFICE PROGRESS NOTE  Patient Care Team: Ezequiel Kayser, MD as PCP - General (Internal Medicine) Neldon Mc, MD (General Surgery) Everlene Farrier, MD (Obstetrics and Gynecology) Noreene Filbert, MD Forest Gleason, MD (Inactive) (Unknown Physician Specialty) [name], MD as Consulting Physician (Hematology and Oncology)  Cancer Staging Carcinoma of lower-outer quadrant of right breast in female, estrogen receptor positive [facility]) Staging form: Breast, AJCC 7th Edition - Pathologic: ypT1c,ypN2a, MX - Signed by Haywood Lasso, MD on 03/10/2012 Cancer stage: ypT1c,ypN2a, MX    Oncology History Overview Note  # DEC 2012- RIGHT BREAST CA T2 N2 M0 tumor from biopsy.  Estrogen receptor positive, Progesterone receptor positive.  Current receptor negative by FISH 2. Neoadjuvant chemotherapy started in December of 28 with Cytoxan Adriamycin 3. Started on Taxol weekly chemotherapy. 4. Patient finished 12  cycles of Taxol chemotherapy in May of 2013.     5. Status post right modified radical mastectomy [Dr.Bowers; [place]] June of 2013, ypT1c  yp N2  MO. started also on Lerazole    7. Radiation therapy to the right breast (September of 2013).  Lymph node was positive for HER-2 receptor gene amplification of 2.22.  Will proceed with Herceptin treatment starting in September of 2013.   8.Patient has finished Herceptin (maintenance therapy) in August of 2014 8. Start patient on letrozole from November, 2013. 9. Patient started on Herceptin in September 2013.    10Patient finished 12 months of Herceptin therapy on August, 2014  # 6. Status post left side prophylactic mastectomy.  #Late MAY 2018-RECURRENCE BREAST CA- ER positive/PR negative; ?? HER-2/neu- [biopsy- proven-mediastinal lymph node; in suff for her 2 testing].  [elevated Tumor marker- CT/PET- uptake in Right Mediastinal LN; Sternum [June 2018 EBUS- Dr.Kasa]   # March 10 2017- faslodex + Abema;  OCT 5th CT-PR of mediastinal LN [consent]  #August 2020-left chest wall nodule biopsy benign  #August 2019- DVT-right upper extremity; Eliquis; stop end of March 2021 [repeat ultrasound negative/patient preference]; # JAN 2022-Cirrhosis- ? On CT scan-FEB 2022MRI liver- Negative fro overt cirrhosis --------------------------------------------    DIAGNOSIS: [ BREAST CANCER- ER/PR/HER2 NEU POS  STAGE:  4   ;GOALS: PALLIATIVE  CURRENT/MOST RECENT THERAPY - ABEMA + FASLODEX    Carcinoma of lower-outer quadrant of right breast in female, estrogen receptor positive ([place])   INTERVAL HISTORY:  Kimberly [name] 65 y.o.  female [place] [facility] above history of metastatic ER PR positive HER-2/neu positive breast cancer currently on abema+ Faslodex is here for follow-up/review of the MRI liver.  In the interim patient has joined [employer].  She is try to get physically active.  Denies any headaches.  Denies any new lumps or bumps.  No nausea no vomiting.  No chest pain.  No fever no chills no cough.  Awaiting to get a booster injection for Covid.  Review of Systems  Constitutional: Positive for malaise/fatigue. Negative for chills, diaphoresis, fever and weight loss.  HENT: Negative for nosebleeds and sore throat.   Eyes: Negative for double vision.  Respiratory: Negative for cough, hemoptysis, sputum production, shortness of breath and wheezing.   Cardiovascular: Negative for chest pain, palpitations, orthopnea and leg swelling.  Gastrointestinal: Negative for abdominal pain, blood in stool, constipation, heartburn, melena, nausea and vomiting.  Musculoskeletal: Positive for back pain and joint pain.  Neurological: Negative for dizziness, tingling, focal weakness, weakness and headaches.  Endo/Heme/Allergies: Does not bruise/bleed easily.  Psychiatric/Behavioral: Negative for depression. The patient is not nervous/anxious and does not  have insomnia.       PAST MEDICAL  HISTORY :  Past Medical History:  Diagnosis Date  . Arthritis   . Cancer of lower-outer quadrant of female breast ([place]) 08/06/2011   RIGHT, chemo and bilateral mastectomies.  . High cholesterol   . History of kidney stones   . Hx of bilateral breast implants   . Hypertension   . Lung metastases ([place]) 20118   chemo pills and faslidex shots.  Marland Kitchen PONV (postoperative nausea and vomiting)   . Type II diabetes mellitus ([facility])    fasting 140-150  . Vertigo    LAST WEEK  . Vertigo 01/2017    PAST SURGICAL HISTORY :   Past Surgical History:  Procedure Laterality Date  . BREAST BIOPSY  07/2011, 2020   right  . BREAST RECONSTRUCTION  03/07/2012   Procedure: BREAST RECONSTRUCTION;  Surgeon: Crissie Reese, MD;  Location: [place];  Service: Plastics;  Laterality: Left;  BREAST RECONSTRUCTION WITH PLACEMENT OF TISSUE EXPANDER TO LEFT BREAST  . BREAST RECONSTRUCTION  02/06/2013  . CESAREAN SECTION  [id number]  . ENDOBRONCHIAL ULTRASOUND N/A 02/24/2017   Procedure: ENDOBRONCHIAL ULTRASOUND;  Surgeon: [name], MD;  Location: [facility];  Service: Cardiopulmonary;  Laterality: N/A;  . LATISSIMUS FLAP TO BREAST Right 02/06/2013   Procedure: LATISSIMUS FLAP TO RIGHT BREAST WITH IMPLANT;  Surgeon: Crissie Reese, MD;  Location: [place];  Service: Plastics;  Laterality: Right;  . MASTECTOMY  03/07/12   modified right; total left  . MODIFIED MASTECTOMY  03/07/2012   Procedure: MODIFIED MASTECTOMY;  Surgeon: Haywood Lasso, MD;  Location: [place];  Service: General;  Laterality: Right;  . PORTACATH PLACEMENT  07/2011  . SCAR REVISION  03/30/2012   Procedure: SCAR REVISION;  Surgeon: Haywood Lasso, MD;  Location: [place];  Service: General;  Laterality: Right;  CLOSURE OF MASTECTOMY INCISION  . WISDOM TOOTH EXTRACTION      FAMILY HISTORY :   Family History  Problem Relation Age of Onset  . Breast cancer Mother   . Diabetes Father     SOCIAL HISTORY:   Social History   Tobacco Use   . Smoking status: Never Smoker  . Smokeless tobacco: Never Used  Vaping Use  . Vaping Use: Never used  Substance Use Topics  . Alcohol use: No  . Drug use: No    ALLERGIES:  is allergic to sulfa antibiotics.  MEDICATIONS:  Current Outpatient Medications  Medication Sig Dispense Refill  . atorvastatin (LIPITOR) 20 MG tablet Take 1 tablet (20 mg total) by mouth daily. 90 tablet 3  . betamethasone valerate (VALISONE) 0.1 % cream Apply 0.1 application topically as needed. (Patient not taking: No sig reported)    . Calcium Carbonate-Vitamin D (CALCIUM 600 + D PO) Take 1 tablet by mouth 2 (two) times daily.    . fulvestrant (FASLODEX) 250 MG/5ML injection Inject into the muscle every 30 (thirty) days. One injection each buttock over 1-2 minutes. Warm prior to use.    Marland Kitchen glipiZIDE (GLUCOTROL XL) 10 MG 24 hr tablet Take 1 tablet (10 mg total) by mouth 2 (two) times daily. 180 tablet 3  . insulin aspart (NOVOLOG FLEXPEN) 100 UNIT/ML FlexPen Inject 4 Units into the skin 3 (three) times daily with meals. 15 mL 0  . Insulin Detemir (LEVEMIR) 100 UNIT/ML Pen Inject 20 Units into the skin at bedtime. 15 mL 0  . levothyroxine (SYNTHROID) 25 MCG tablet Take 1 tablet (25 mcg total) by mouth  daily before breakfast. 90 tablet 1  . lisinopril (ZESTRIL) 10 MG tablet Take 10 mg by mouth daily.    Marland Kitchen loperamide (IMODIUM A-D) 2 MG tablet Take 2 mg by mouth 4 (four) times daily as needed for diarrhea or loose stools.    . mupirocin cream (BACTROBAN) 2 % Apply 1 application topically 2 (two) times daily. 15 g 0  . mupirocin ointment (BACTROBAN) 2 % mupirocin 2 % topical ointment  APPLY 1 APPLICATION TOPICALLY 3 (THREE) TIMES DAILY.    Marland Kitchen ondansetron (ZOFRAN) 8 MG tablet Take 1 tablet (8 mg total) by mouth every 8 (eight) hours as needed for nausea or vomiting. (Patient not taking: No sig reported) 20 tablet 3  . ondansetron (ZOFRAN-ODT) 4 MG disintegrating tablet Take 1 tablet (4 mg total) by mouth every 8 (eight)  hours as needed for nausea or vomiting. (Patient not taking: No sig reported) 20 tablet 3  . prochlorperazine (COMPAZINE) 10 MG tablet Take 1 tablet (10 mg total) by mouth every 6 (six) hours as needed for nausea or vomiting. (Patient not taking: No sig reported) 30 tablet 0  . promethazine (PHENERGAN) 25 MG tablet Take 1 tablet (25 mg total) by mouth every 6 (six) hours as needed for refractory nausea / vomiting. (Patient not taking: No sig reported) 30 tablet 0  . terbinafine (LAMISIL) 250 MG tablet Take 1 tablet (250 mg total) by mouth daily. (Patient not taking: No sig reported) 14 tablet 0  . VERZENIO 100 MG tablet TAKE 1 TABLET BY MOUTH TWICE DAILY 56 tablet 0   No current facility-administered medications for this visit.    PHYSICAL EXAMINATION: ECOG PERFORMANCE STATUS: 1 - Symptomatic but completely ambulatory  BP 109/79   Pulse 74   Temp (!) 97.2 F (36.2 C) (Tympanic)   Resp 20   Ht '5\' 2"'  (1.575 m)   Wt 170 lb (77.1 kg)   BMI 31.09 kg/m   Filed Weights   10/06/20 1000  Weight: 170 lb (77.1 kg)    Physical Exam Constitutional:      Comments: She is alone.   HENT:     Head: Normocephalic and atraumatic.     Mouth/Throat:     Pharynx: No oropharyngeal exudate.  Eyes:     Pupils: Pupils are equal, round, and reactive to light.  Cardiovascular:     Rate and Rhythm: Normal rate and regular rhythm.  Pulmonary:     Effort: Pulmonary effort is normal. No respiratory distress.     Breath sounds: Normal breath sounds. No wheezing.  Abdominal:     General: Bowel sounds are normal. There is no distension.     Palpations: Abdomen is soft. There is no mass.     Tenderness: There is no abdominal tenderness. There is no guarding or rebound.  Musculoskeletal:        General: No tenderness. Normal range of motion.     Cervical back: Normal range of motion and neck supple.     Comments: Approximately 1 cm hard nodule felt in the anterior chest wall/left parasternal.  Skin:     General: Skin is warm.     Comments: Maculopapular rash in the left inguinal region/fold.  Neurological:     Mental Status: She is alert and oriented to person, place, and time.  Psychiatric:        Mood and Affect: Affect normal.     LABORATORY DATA:  I have reviewed the data as listed    Component Value Date/Time  NA 135 10/06/2020 0933   NA 135 11/28/2014 1401   K 3.9 10/06/2020 0933   K 4.7 11/28/2014 1401   CL 100 10/06/2020 0933   CL 99 (L) 11/28/2014 1401   CO2 24 10/06/2020 0933   CO2 28 11/28/2014 1401   GLUCOSE 173 (H) 10/06/2020 0933   GLUCOSE 215 (H) 11/28/2014 1401   BUN 19 10/06/2020 0933   BUN 16 11/28/2014 1401   CREATININE 0.99 10/06/2020 0933   CREATININE 0.96 11/28/2014 1401   CALCIUM 9.7 10/06/2020 0933   CALCIUM 9.9 11/28/2014 1401   PROT 7.6 10/06/2020 0933   PROT 7.6 11/28/2014 1401   ALBUMIN 3.6 10/06/2020 0933   ALBUMIN 4.5 11/28/2014 1401   AST 30 10/06/2020 0933   AST 26 11/28/2014 1401   ALT 20 10/06/2020 0933   ALT 28 11/28/2014 1401   ALKPHOS 78 10/06/2020 0933   ALKPHOS 78 11/28/2014 1401   BILITOT 0.3 10/06/2020 0933   BILITOT 0.5 11/28/2014 1401   GFRNONAA >60 10/06/2020 0933   GFRNONAA >60 11/28/2014 1401   GFRAA >60 05/30/2020 1022   GFRAA >60 11/28/2014 1401    No results found for: SPEP, UPEP  Lab Results  Component Value Date   WBC 3.8 (L) 10/06/2020   NEUTROABS 1.6 (L) 10/06/2020   HGB 12.0 10/06/2020   HCT 35.1 (L) 10/06/2020   MCV 95.4 10/06/2020   PLT 266 10/06/2020      Chemistry      Component Value Date/Time   NA 135 10/06/2020 0933   NA 135 11/28/2014 1401   K 3.9 10/06/2020 0933   K 4.7 11/28/2014 1401   CL 100 10/06/2020 0933   CL 99 (L) 11/28/2014 1401   CO2 24 10/06/2020 0933   CO2 28 11/28/2014 1401   BUN 19 10/06/2020 0933   BUN 16 11/28/2014 1401   CREATININE 0.99 10/06/2020 0933   CREATININE 0.96 11/28/2014 1401      Component Value Date/Time   CALCIUM 9.7 10/06/2020 0933   CALCIUM 9.9  11/28/2014 1401   ALKPHOS 78 10/06/2020 0933   ALKPHOS 78 11/28/2014 1401   AST 30 10/06/2020 0933   AST 26 11/28/2014 1401   ALT 20 10/06/2020 0933   ALT 28 11/28/2014 1401   BILITOT 0.3 10/06/2020 0933   BILITOT 0.5 11/28/2014 1401       RADIOGRAPHIC STUDIES: I have personally reviewed the radiological images as listed and agreed with the findings in the report. No results found.   ASSESSMENT & PLAN:  Carcinoma of lower-outer quadrant of right breast in female, estrogen receptor positive ([place])  # Recurrent breast cancer-ER positive PR negative ? HER-2/neu-currently on Faslodex plus abema.  JAN 5th 2022-  No evidence of metastatic disease in the chest, abdomen or pelvis-STABLE.  # Continue Faslodex; CONTINUE Abema 100 BID; Labs today- ANC- 1.7;   # Cirrhosis- ? On CT scan-MRI liver- Negative for any overt cirrhosis. No further work up is recommended.    #  Poorly controlled BG- 176; recommend better control of BG. STABLE.    #DISPOSITION: # Faslodex/today  # Follow up in 4 weeks- - MD/ labs (CBC/CMP/Ca 27-29) Faslodex;-Dr.B   # I reviewed the blood work- with the patient in detail; also reviewed the imaging independently [as summarized above]; and with the patient in detail.      No orders of the defined types were placed in this encounter.  All questions were answered. The patient knows to call the clinic with any problems, questions  or concerns.      [name], MD 10/06/2020 10:56 AM

## 2020-10-07 LAB — CANCER ANTIGEN 27.29: CA 27.29: 23.3 U/mL (ref 0.0–38.6)

## 2020-10-07 LAB — HEPATITIS B SURFACE ANTIBODY, QUANTITATIVE: Hep B S AB Quant (Post): <3.1 m[IU]/mL — ABNORMAL LOW (ref >9.9)

## 2020-10-12 IMAGING — US US SOFT TISSUE EXCLUDE HEAD/NECK
1 series · 10 of 10 positions shown · non-contrast
Comparison: None.

CLINICAL DATA: Carcinoma of the lower outer quadrant of the right
breast. A nodule in the anterior left chest has been palpated.

EXAM:
ULTRASOUND OF HEAD/NECK SOFT TISSUES
TECHNIQUE: Ultrasound examination of the head and neck soft tissues was
performed in the area of clinical concern.

[Series 1: us soft tissue exclude head/neck · 0.06mm/px · 10 acquisitions, 10 frames shown]
[im 1/10]
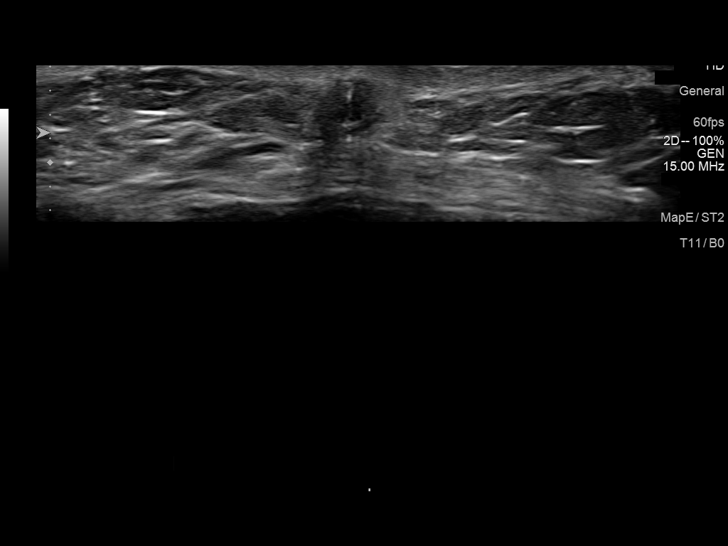
[im 2/10]
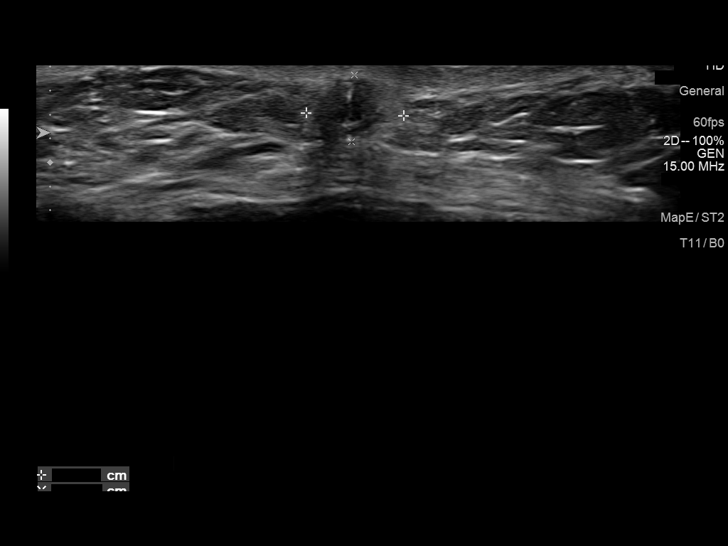
[im 3/10]
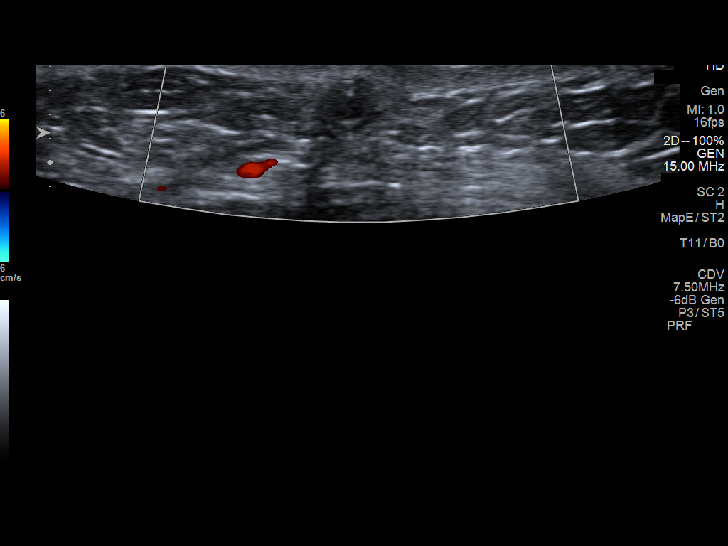
[im 4/10]
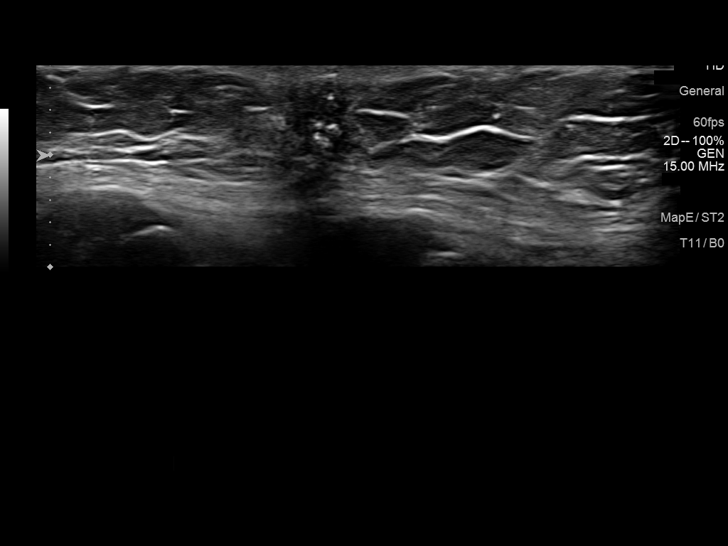
[im 5/10]
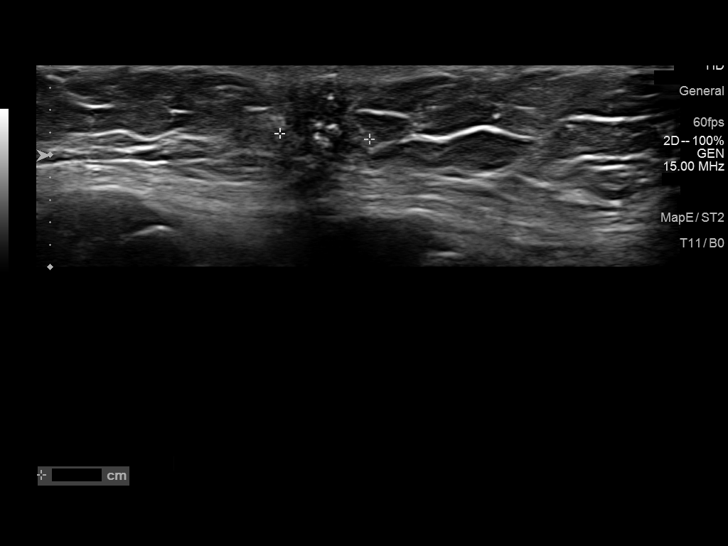
[im 6/10]
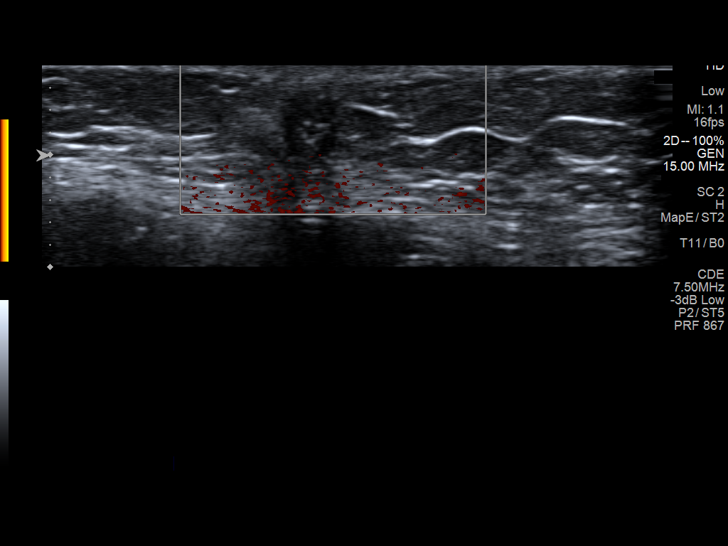
[im 7/10]
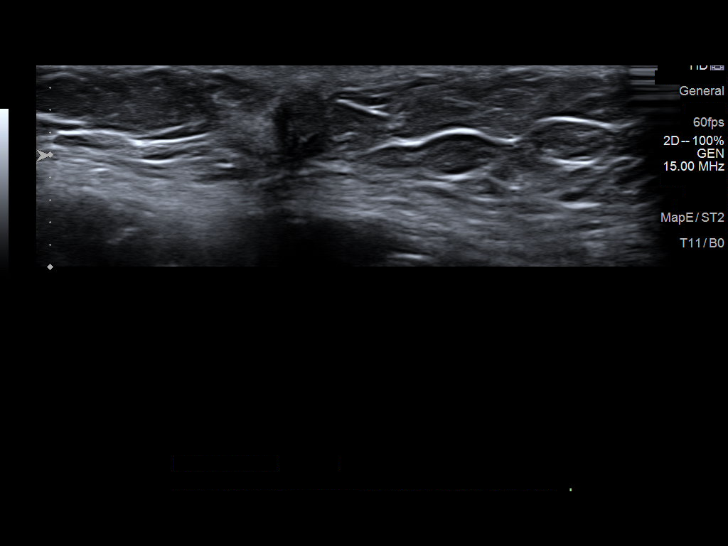
[im 8/10]
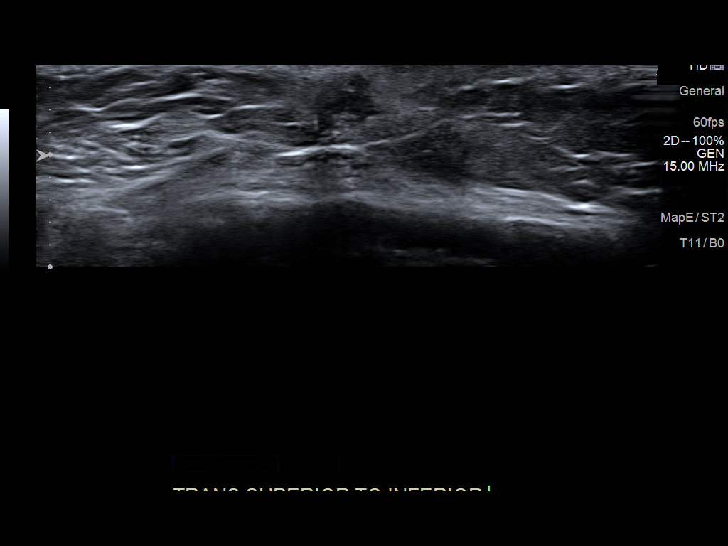
[im 9/10]
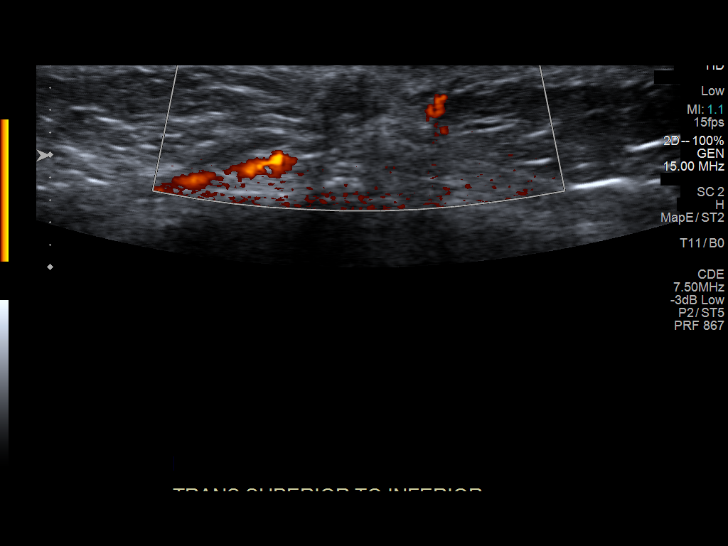
[im 10/10]
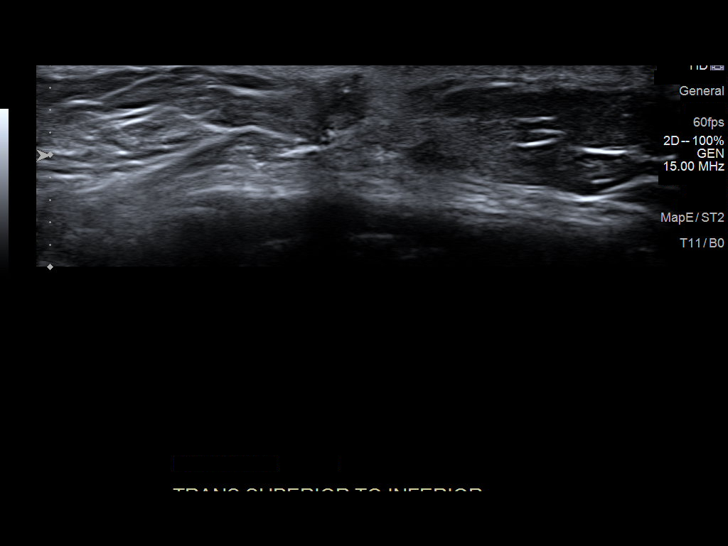

[10 of 10 positions shown; findings below may reference images not displayed]

FINDINGS: There is an irregular hypoechoic mass in the region of the patient's
symptoms extending from the undersurface of the skin to the chest
wall. Involvement of the skin and/or chest wall musculature is not
excluded. There appear to be calcifications within the lump.
IMPRESSION: There is a hypoechoic irregular mass with internal calcifications in
the region of the patient's symptoms on the left. Involvement of the
overlying skin and/or the underlying musculature is not excluded but
not definitive. The internal calcifications raise the possibility of
scarring or fat necrosis. However, malignancy is not excluded on
this study. An MRI of the chest may help to confirm fat necrosis. If
an MRI is not performed or if an MRI does not confirm fat necrosis,
recommend ultrasound-guided biopsy to exclude malignancy.

These results will be called to the ordering clinician or
representative by the Radiologist Assistant, and communication
documented in the PACS or zVision Dashboard.

## 2020-10-14 ENCOUNTER — Other Ambulatory Visit: Payer: Self-pay | Admitting: Nurse Practitioner

## 2020-10-14 DIAGNOSIS — Z170 Estrogen receptor positive status [ER+]: Secondary | ICD-10-CM

## 2020-11-03 ENCOUNTER — Inpatient Hospital Stay: Payer: PPO | Attending: Internal Medicine

## 2020-11-03 ENCOUNTER — Inpatient Hospital Stay: Payer: PPO

## 2020-11-03 ENCOUNTER — Inpatient Hospital Stay: Payer: PPO | Admitting: Internal Medicine

## 2020-11-03 ENCOUNTER — Encounter: Payer: Self-pay | Admitting: Internal Medicine

## 2020-11-03 DIAGNOSIS — Z9013 Acquired absence of bilateral breasts and nipples: Secondary | ICD-10-CM | POA: Insufficient documentation

## 2020-11-03 DIAGNOSIS — Z170 Estrogen receptor positive status [ER+]: Secondary | ICD-10-CM | POA: Diagnosis not present

## 2020-11-03 DIAGNOSIS — Z833 Family history of diabetes mellitus: Secondary | ICD-10-CM | POA: Diagnosis not present

## 2020-11-03 DIAGNOSIS — Z794 Long term (current) use of insulin: Secondary | ICD-10-CM | POA: Diagnosis not present

## 2020-11-03 DIAGNOSIS — Z5111 Encounter for antineoplastic chemotherapy: Secondary | ICD-10-CM | POA: Insufficient documentation

## 2020-11-03 DIAGNOSIS — C50511 Malignant neoplasm of lower-outer quadrant of right female breast: Secondary | ICD-10-CM | POA: Diagnosis not present

## 2020-11-03 DIAGNOSIS — E119 Type 2 diabetes mellitus without complications: Secondary | ICD-10-CM | POA: Insufficient documentation

## 2020-11-03 DIAGNOSIS — C7800 Secondary malignant neoplasm of unspecified lung: Secondary | ICD-10-CM | POA: Insufficient documentation

## 2020-11-03 DIAGNOSIS — Z79899 Other long term (current) drug therapy: Secondary | ICD-10-CM | POA: Diagnosis not present

## 2020-11-03 DIAGNOSIS — Z803 Family history of malignant neoplasm of breast: Secondary | ICD-10-CM | POA: Insufficient documentation

## 2020-11-03 DIAGNOSIS — I10 Essential (primary) hypertension: Secondary | ICD-10-CM | POA: Insufficient documentation

## 2020-11-03 DIAGNOSIS — Z86718 Personal history of other venous thrombosis and embolism: Secondary | ICD-10-CM | POA: Insufficient documentation

## 2020-11-03 LAB — COMPREHENSIVE METABOLIC PANEL
ALT: 21 U/L (ref 0–44)
AST: 30 U/L (ref 15–41)
Albumin: 3.5 g/dL (ref 3.5–5.0)
Alkaline Phosphatase: 81 U/L (ref 38–126)
Anion gap: 13 (ref 5–15)
BUN: 16 mg/dL (ref 8–23)
CO2: 23 mmol/L (ref 22–32)
Calcium: 9.4 mg/dL (ref 8.9–10.3)
Chloride: 98 mmol/L (ref 98–111)
Creatinine, Ser: 0.99 mg/dL (ref 0.44–1.00)
GFR, Estimated: >60 mL/min (ref >60)
Glucose, Bld: 183 mg/dL — ABNORMAL HIGH (ref 70–99)
Potassium: 3.7 mmol/L (ref 3.5–5.1)
Sodium: 134 mmol/L — ABNORMAL LOW (ref 135–145)
Total Bilirubin: 0.7 mg/dL (ref 0.3–1.2)
Total Protein: 7.2 g/dL (ref 6.5–8.1)

## 2020-11-03 LAB — CBC WITH DIFFERENTIAL/PLATELET
Abs Immature Granulocytes: 0 10*3/uL (ref 0.00–0.07)
Basophils Absolute: 0.1 10*3/uL (ref 0.0–0.1)
Basophils Relative: 2 %
Eosinophils Absolute: 0.1 10*3/uL (ref 0.0–0.5)
Eosinophils Relative: 4 %
HCT: 33.1 % — ABNORMAL LOW (ref 36.0–46.0)
Hemoglobin: 11.4 g/dL — ABNORMAL LOW (ref 12.0–15.0)
Immature Granulocytes: 0 %
Lymphocytes Relative: 46 %
Lymphs Abs: 1.6 10*3/uL (ref 0.7–4.0)
MCH: 33.2 pg (ref 26.0–34.0)
MCHC: 34.4 g/dL (ref 30.0–36.0)
MCV: 96.5 fL (ref 80.0–100.0)
Monocytes Absolute: 0.3 10*3/uL (ref 0.1–1.0)
Monocytes Relative: 7 %
Neutro Abs: 1.4 10*3/uL — ABNORMAL LOW (ref 1.7–7.7)
Neutrophils Relative %: 41 %
Platelets: 239 10*3/uL (ref 150–400)
RBC: 3.43 MIL/uL — ABNORMAL LOW (ref 3.87–5.11)
RDW: 14 % (ref 11.5–15.5)
WBC: 3.4 10*3/uL — ABNORMAL LOW (ref 4.0–10.5)
nRBC: 0 % (ref 0.0–0.2)

## 2020-11-03 MED ORDER — FULVESTRANT 250 MG/5ML IM SOLN
500.0000 mg | Freq: Once | INTRAMUSCULAR | Status: AC
  Administered 2020-11-03: 500 mg via INTRAMUSCULAR
  Filled 2020-11-03: qty 10

## 2020-11-03 NOTE — Progress Notes (Signed)
.[name] OFFICE PROGRESS NOTE  Patient Care Team: Ezequiel Kayser, MD as PCP - General (Internal Medicine) Neldon Mc, MD (General Surgery) Everlene Farrier, MD (Obstetrics and Gynecology) Noreene Filbert, MD Forest Gleason, MD (Inactive) (Unknown Physician Specialty) [name], MD as Consulting Physician (Hematology and Oncology)  Cancer Staging Carcinoma of lower-outer quadrant of right breast in female, estrogen receptor positive [facility]) Staging form: Breast, AJCC 7th Edition - Pathologic: ypT1c,ypN2a, MX - Signed by Haywood Lasso, MD on 03/10/2012 Cancer stage: ypT1c,ypN2a, MX    Oncology History Overview Note  # DEC 2012- RIGHT BREAST CA T2 N2 M0 tumor from biopsy.  Estrogen receptor positive, Progesterone receptor positive.  Current receptor negative by FISH 2. Neoadjuvant chemotherapy started in December of 28 with Cytoxan Adriamycin 3. Started on Taxol weekly chemotherapy. 4. Patient finished 12  cycles of Taxol chemotherapy in May of 2013.     5. Status post right modified radical mastectomy [Dr.Bowers; [place]] June of 2013, ypT1c  yp N2  MO. started also on Lerazole    7. Radiation therapy to the right breast (September of 2013).  Lymph node was positive for HER-2 receptor gene amplification of 2.22.  Will proceed with Herceptin treatment starting in September of 2013.   8.Patient has finished Herceptin (maintenance therapy) in August of 2014 8. Start patient on letrozole from November, 2013. 9. Patient started on Herceptin in September 2013.    10Patient finished 12 months of Herceptin therapy on August, 2014  # 6. Status post left side prophylactic mastectomy.  #Late MAY 2018-RECURRENCE BREAST CA- ER positive/PR negative; ?? HER-2/neu- [biopsy- proven-mediastinal lymph node; in suff for her 2 testing].  [elevated Tumor marker- CT/PET- uptake in Right Mediastinal LN; Sternum [June 2018 EBUS- Dr.Kasa]   # March 10 2017- faslodex + Abema;  OCT 5th CT-PR of mediastinal LN [consent]  #August 2020-left chest wall nodule biopsy benign  #August 2019- DVT-right upper extremity; Eliquis; stop end of March 2021 [repeat ultrasound negative/patient preference]; # JAN 2022-Cirrhosis- ? On CT scan-FEB 2022MRI liver- Negative fro overt cirrhosis --------------------------------------------------------------- -    DIAGNOSIS: [ BREAST CANCER- ER/PR/HER2 NEU POS  STAGE:  4   ;GOALS: PALLIATIVE  CURRENT/MOST RECENT THERAPY - ABEMA + FASLODEX    Carcinoma of lower-outer quadrant of right breast in female, estrogen receptor positive ([place])   INTERVAL HISTORY:  Kimberly [name] 65 y.o.  female pleasant patient above history of metastatic ER PR positive HER-2/neu positive breast cancer currently on abema+ Faslodex is here for follow-up.  Patient continues to be physically active.  She has been doing water aerobics lessons/and also walks 3 miles last week.  Denies any headaches.  Denies any new lumps or bumps.  No nausea no vomiting.  No fevers or chills.  Review of Systems  Constitutional: Positive for malaise/fatigue. Negative for chills, diaphoresis, fever and weight loss.  HENT: Negative for nosebleeds and sore throat.   Eyes: Negative for double vision.  Respiratory: Negative for cough, hemoptysis, sputum production, shortness of breath and wheezing.   Cardiovascular: Negative for chest pain, palpitations, orthopnea and leg swelling.  Gastrointestinal: Negative for abdominal pain, blood in stool, constipation, heartburn, melena, nausea and vomiting.  Musculoskeletal: Positive for back pain and joint pain.  Neurological: Negative for dizziness, tingling, focal weakness, weakness and headaches.  Endo/Heme/Allergies: Does not bruise/bleed easily.  Psychiatric/Behavioral: Negative for depression. The patient is not nervous/anxious and does not have insomnia.       PAST MEDICAL HISTORY :  Past Medical  History:  Diagnosis Date  .  Arthritis   . Cancer of lower-outer quadrant of female breast ([place]) 08/06/2011   RIGHT, chemo and bilateral mastectomies.  . High cholesterol   . History of kidney stones   . Hx of bilateral breast implants   . Hypertension   . Lung metastases ([place]) 20118   chemo pills and faslidex shots.  Marland Kitchen PONV (postoperative nausea and vomiting)   . Type II diabetes mellitus ([facility])    fasting 140-150  . Vertigo    LAST WEEK  . Vertigo 01/2017    PAST SURGICAL HISTORY :   Past Surgical History:  Procedure Laterality Date  . BREAST BIOPSY  07/2011, 2020   right  . BREAST RECONSTRUCTION  03/07/2012   Procedure: BREAST RECONSTRUCTION;  Surgeon: Crissie Reese, MD;  Location: [place];  Service: Plastics;  Laterality: Left;  BREAST RECONSTRUCTION WITH PLACEMENT OF TISSUE EXPANDER TO LEFT BREAST  . BREAST RECONSTRUCTION  02/06/2013  . CESAREAN SECTION  [id number]  . ENDOBRONCHIAL ULTRASOUND N/A 02/24/2017   Procedure: ENDOBRONCHIAL ULTRASOUND;  Surgeon: [name], MD;  Location: [facility];  Service: Cardiopulmonary;  Laterality: N/A;  . LATISSIMUS FLAP TO BREAST Right 02/06/2013   Procedure: LATISSIMUS FLAP TO RIGHT BREAST WITH IMPLANT;  Surgeon: Crissie Reese, MD;  Location: [place];  Service: Plastics;  Laterality: Right;  . MASTECTOMY  03/07/12   modified right; total left  . MODIFIED MASTECTOMY  03/07/2012   Procedure: MODIFIED MASTECTOMY;  Surgeon: Haywood Lasso, MD;  Location: [place];  Service: General;  Laterality: Right;  . PORTACATH PLACEMENT  07/2011  . SCAR REVISION  03/30/2012   Procedure: SCAR REVISION;  Surgeon: Haywood Lasso, MD;  Location: [place];  Service: General;  Laterality: Right;  CLOSURE OF MASTECTOMY INCISION  . WISDOM TOOTH EXTRACTION      FAMILY HISTORY :   Family History  Problem Relation Age of Onset  . Breast cancer Mother   . Diabetes Father     SOCIAL HISTORY:   Social History   Tobacco Use  . Smoking status: Never Smoker  . Smokeless tobacco:  Never Used  Vaping Use  . Vaping Use: Never used  Substance Use Topics  . Alcohol use: No  . Drug use: No    ALLERGIES:  is allergic to sulfa antibiotics.  MEDICATIONS:  Current Outpatient Medications  Medication Sig Dispense Refill  . atorvastatin (LIPITOR) 20 MG tablet Take 1 tablet (20 mg total) by mouth daily. 90 tablet 3  . Calcium Carbonate-Vitamin D (CALCIUM 600 + D PO) Take 1 tablet by mouth 2 (two) times daily.    . fulvestrant (FASLODEX) 250 MG/5ML injection Inject into the muscle every 30 (thirty) days. One injection each buttock over 1-2 minutes. Warm prior to use.    Marland Kitchen glipiZIDE (GLUCOTROL XL) 10 MG 24 hr tablet Take 1 tablet (10 mg total) by mouth 2 (two) times daily. 180 tablet 3  . insulin aspart (NOVOLOG FLEXPEN) 100 UNIT/ML FlexPen Inject 4 Units into the skin 3 (three) times daily with meals. 15 mL 0  . Insulin Detemir (LEVEMIR) 100 UNIT/ML Pen Inject 20 Units into the skin at bedtime. 15 mL 0  . levothyroxine (SYNTHROID) 25 MCG tablet Take 1 tablet (25 mcg total) by mouth daily before breakfast. 90 tablet 1  . lisinopril (ZESTRIL) 10 MG tablet Take 10 mg by mouth daily.    Marland Kitchen loperamide (IMODIUM A-D) 2 MG tablet Take 2 mg by mouth 4 (four) times  daily as needed for diarrhea or loose stools.    . mupirocin cream (BACTROBAN) 2 % Apply 1 application topically 2 (two) times daily. 15 g 0  . mupirocin ointment (BACTROBAN) 2 % mupirocin 2 % topical ointment  APPLY 1 APPLICATION TOPICALLY 3 (THREE) TIMES DAILY.    Marland Kitchen VERZENIO 100 MG tablet TAKE 1 TABLET BY MOUTH TWICE DAILY 56 tablet 0  . betamethasone valerate (VALISONE) 0.1 % cream Apply 0.1 application topically as needed. (Patient not taking: No sig reported)    . ondansetron (ZOFRAN) 8 MG tablet Take 1 tablet (8 mg total) by mouth every 8 (eight) hours as needed for nausea or vomiting. (Patient not taking: Reported on 11/03/2020) 20 tablet 3  . ondansetron (ZOFRAN-ODT) 4 MG disintegrating tablet Take 1 tablet (4 mg total) by  mouth every 8 (eight) hours as needed for nausea or vomiting. (Patient not taking: No sig reported) 20 tablet 3  . prochlorperazine (COMPAZINE) 10 MG tablet Take 1 tablet (10 mg total) by mouth every 6 (six) hours as needed for nausea or vomiting. (Patient not taking: No sig reported) 30 tablet 0  . promethazine (PHENERGAN) 25 MG tablet Take 1 tablet (25 mg total) by mouth every 6 (six) hours as needed for refractory nausea / vomiting. (Patient not taking: No sig reported) 30 tablet 0   No current facility-administered medications for this visit.    PHYSICAL EXAMINATION: ECOG PERFORMANCE STATUS: 1 - Symptomatic but completely ambulatory  BP 137/75 (BP Location: Left Arm, Patient Position: Sitting, Cuff Size: Normal)   Pulse 96   Temp (!) 97.3 F (36.3 C) (Tympanic)   Resp 16   Ht 5' 2" (1.575 m)   Wt 173 lb (78.5 kg)   SpO2 99%   BMI 31.64 kg/m   Filed Weights   11/03/20 1043  Weight: 173 lb (78.5 kg)    Physical Exam Constitutional:      Comments: She is alone.   HENT:     Head: Normocephalic and atraumatic.     Mouth/Throat:     Pharynx: No oropharyngeal exudate.  Eyes:     Pupils: Pupils are equal, round, and reactive to light.  Cardiovascular:     Rate and Rhythm: Normal rate and regular rhythm.  Pulmonary:     Effort: Pulmonary effort is normal. No respiratory distress.     Breath sounds: Normal breath sounds. No wheezing.  Abdominal:     General: Bowel sounds are normal. There is no distension.     Palpations: Abdomen is soft. There is no mass.     Tenderness: There is no abdominal tenderness. There is no guarding or rebound.  Musculoskeletal:        General: No tenderness. Normal range of motion.     Cervical back: Normal range of motion and neck supple.     Comments: Approximately 1 cm hard nodule felt in the anterior chest wall/left parasternal.  Skin:    General: Skin is warm.     Comments: Maculopapular rash in the left inguinal region/fold.   Neurological:     Mental Status: She is alert and oriented to person, place, and time.  Psychiatric:        Mood and Affect: Affect normal.     LABORATORY DATA:  I have reviewed the data as listed    Component Value Date/Time   NA 134 (L) 11/03/2020 0947   NA 135 11/28/2014 1401   K 3.7 11/03/2020 0947   K 4.7 11/28/2014 1401  CL 98 11/03/2020 0947   CL 99 (L) 11/28/2014 1401   CO2 23 11/03/2020 0947   CO2 28 11/28/2014 1401   GLUCOSE 183 (H) 11/03/2020 0947   GLUCOSE 215 (H) 11/28/2014 1401   BUN 16 11/03/2020 0947   BUN 16 11/28/2014 1401   CREATININE 0.99 11/03/2020 0947   CREATININE 0.96 11/28/2014 1401   CALCIUM 9.4 11/03/2020 0947   CALCIUM 9.9 11/28/2014 1401   PROT 7.2 11/03/2020 0947   PROT 7.6 11/28/2014 1401   ALBUMIN 3.5 11/03/2020 0947   ALBUMIN 4.5 11/28/2014 1401   AST 30 11/03/2020 0947   AST 26 11/28/2014 1401   ALT 21 11/03/2020 0947   ALT 28 11/28/2014 1401   ALKPHOS 81 11/03/2020 0947   ALKPHOS 78 11/28/2014 1401   BILITOT 0.7 11/03/2020 0947   BILITOT 0.5 11/28/2014 1401   GFRNONAA >60 11/03/2020 0947   GFRNONAA >60 11/28/2014 1401   GFRAA >60 05/30/2020 1022   GFRAA >60 11/28/2014 1401    No results found for: SPEP, UPEP  Lab Results  Component Value Date   WBC 3.4 (L) 11/03/2020   NEUTROABS 1.4 (L) 11/03/2020   HGB 11.4 (L) 11/03/2020   HCT 33.1 (L) 11/03/2020   MCV 96.5 11/03/2020   PLT 239 11/03/2020      Chemistry      Component Value Date/Time   NA 134 (L) 11/03/2020 0947   NA 135 11/28/2014 1401   K 3.7 11/03/2020 0947   K 4.7 11/28/2014 1401   CL 98 11/03/2020 0947   CL 99 (L) 11/28/2014 1401   CO2 23 11/03/2020 0947   CO2 28 11/28/2014 1401   BUN 16 11/03/2020 0947   BUN 16 11/28/2014 1401   CREATININE 0.99 11/03/2020 0947   CREATININE 0.96 11/28/2014 1401      Component Value Date/Time   CALCIUM 9.4 11/03/2020 0947   CALCIUM 9.9 11/28/2014 1401   ALKPHOS 81 11/03/2020 0947   ALKPHOS 78 11/28/2014 1401    AST 30 11/03/2020 0947   AST 26 11/28/2014 1401   ALT 21 11/03/2020 0947   ALT 28 11/28/2014 1401   BILITOT 0.7 11/03/2020 0947   BILITOT 0.5 11/28/2014 1401       RADIOGRAPHIC STUDIES: I have personally reviewed the radiological images as listed and agreed with the findings in the report. No results found.   ASSESSMENT & PLAN:  Carcinoma of lower-outer quadrant of right breast in female, estrogen receptor positive ([place])  # Recurrent breast cancer-ER positive PR negative ? HER-2/neu-currently on Faslodex plus abema.  JAN 5th 2022-  No evidence of metastatic disease in the chest, abdomen or pelvis STABLE.   # Continue Faslodex; CONTINUE Abema 100 BID; Labs today- ANC- 1.4-; Hb 11.8; platlets-N.   #  Poorly controlled BG- 183-STABLE.     #DISPOSITION: # Faslodex/today  # Follow up in 4 weeks- - MD/ labs (CBC/CMP/Ca 27-29) Faslodex;-Dr.B   No orders of the defined types were placed in this encounter.  All questions were answered. The patient knows to call the clinic with any problems, questions or concerns.      [name], MD 11/03/2020 1:01 PM

## 2020-11-03 NOTE — Assessment & Plan Note (Addendum)
#  Recurrent breast cancer-ER positive PR negative ? HER-2/neu-currently on Faslodex plus abema.  JAN 5th 2022-  No evidence of metastatic disease in the chest, abdomen or pelvis STABLE.   # Continue Faslodex; CONTINUE Abema 100 BID; Labs today- ANC- 1.4-; Hb 11.8; platlets-N.   #  Poorly controlled BG- 183-STABLE.     #DISPOSITION: # Faslodex/today  # Follow up in 4 weeks- - MD/ labs (CBC/CMP/Ca 27-29) Faslodex;-Dr.B

## 2020-11-04 LAB — CANCER ANTIGEN 27.29: CA 27.29: 25.3 U/mL (ref 0.0–38.6)

## 2020-11-10 ENCOUNTER — Other Ambulatory Visit: Payer: Self-pay | Admitting: Nurse Practitioner

## 2020-11-10 DIAGNOSIS — C50511 Malignant neoplasm of lower-outer quadrant of right female breast: Secondary | ICD-10-CM

## 2020-11-10 DIAGNOSIS — Z170 Estrogen receptor positive status [ER+]: Secondary | ICD-10-CM

## 2020-11-18 DIAGNOSIS — N182 Chronic kidney disease, stage 2 (mild): Secondary | ICD-10-CM | POA: Diagnosis not present

## 2020-11-18 DIAGNOSIS — I700 Atherosclerosis of aorta: Secondary | ICD-10-CM | POA: Diagnosis not present

## 2020-11-18 DIAGNOSIS — F5101 Primary insomnia: Secondary | ICD-10-CM | POA: Diagnosis not present

## 2020-11-18 DIAGNOSIS — K760 Fatty (change of) liver, not elsewhere classified: Secondary | ICD-10-CM | POA: Diagnosis not present

## 2020-11-18 DIAGNOSIS — Z794 Long term (current) use of insulin: Secondary | ICD-10-CM | POA: Diagnosis not present

## 2020-11-18 DIAGNOSIS — C50919 Malignant neoplasm of unspecified site of unspecified female breast: Secondary | ICD-10-CM | POA: Diagnosis not present

## 2020-11-18 DIAGNOSIS — J301 Allergic rhinitis due to pollen: Secondary | ICD-10-CM | POA: Diagnosis not present

## 2020-11-18 DIAGNOSIS — Z79899 Other long term (current) drug therapy: Secondary | ICD-10-CM | POA: Diagnosis not present

## 2020-11-18 DIAGNOSIS — C50511 Malignant neoplasm of lower-outer quadrant of right female breast: Secondary | ICD-10-CM | POA: Diagnosis not present

## 2020-11-18 DIAGNOSIS — M1711 Unilateral primary osteoarthritis, right knee: Secondary | ICD-10-CM | POA: Diagnosis not present

## 2020-11-18 DIAGNOSIS — E039 Hypothyroidism, unspecified: Secondary | ICD-10-CM | POA: Diagnosis not present

## 2020-11-18 DIAGNOSIS — E1169 Type 2 diabetes mellitus with other specified complication: Secondary | ICD-10-CM | POA: Diagnosis not present

## 2020-11-18 DIAGNOSIS — E1159 Type 2 diabetes mellitus with other circulatory complications: Secondary | ICD-10-CM | POA: Diagnosis not present

## 2020-11-18 DIAGNOSIS — E1122 Type 2 diabetes mellitus with diabetic chronic kidney disease: Secondary | ICD-10-CM | POA: Diagnosis not present

## 2020-11-20 DIAGNOSIS — R399 Unspecified symptoms and signs involving the genitourinary system: Secondary | ICD-10-CM | POA: Diagnosis not present

## 2020-11-27 DIAGNOSIS — Z124 Encounter for screening for malignant neoplasm of cervix: Secondary | ICD-10-CM | POA: Diagnosis not present

## 2020-11-27 DIAGNOSIS — N952 Postmenopausal atrophic vaginitis: Secondary | ICD-10-CM | POA: Diagnosis not present

## 2020-11-27 DIAGNOSIS — Z853 Personal history of malignant neoplasm of breast: Secondary | ICD-10-CM | POA: Diagnosis not present

## 2020-11-27 DIAGNOSIS — B354 Tinea corporis: Secondary | ICD-10-CM | POA: Diagnosis not present

## 2020-11-27 DIAGNOSIS — Z6830 Body mass index (BMI) 30.0-30.9, adult: Secondary | ICD-10-CM | POA: Diagnosis not present

## 2020-12-01 ENCOUNTER — Other Ambulatory Visit: Payer: Self-pay

## 2020-12-01 ENCOUNTER — Inpatient Hospital Stay: Payer: PPO

## 2020-12-01 ENCOUNTER — Inpatient Hospital Stay: Payer: PPO | Attending: Internal Medicine

## 2020-12-01 ENCOUNTER — Inpatient Hospital Stay: Payer: PPO | Admitting: Internal Medicine

## 2020-12-01 DIAGNOSIS — Z9013 Acquired absence of bilateral breasts and nipples: Secondary | ICD-10-CM | POA: Diagnosis not present

## 2020-12-01 DIAGNOSIS — Z794 Long term (current) use of insulin: Secondary | ICD-10-CM | POA: Insufficient documentation

## 2020-12-01 DIAGNOSIS — Z833 Family history of diabetes mellitus: Secondary | ICD-10-CM | POA: Diagnosis not present

## 2020-12-01 DIAGNOSIS — Z5111 Encounter for antineoplastic chemotherapy: Secondary | ICD-10-CM | POA: Insufficient documentation

## 2020-12-01 DIAGNOSIS — Z170 Estrogen receptor positive status [ER+]: Secondary | ICD-10-CM | POA: Diagnosis not present

## 2020-12-01 DIAGNOSIS — Z803 Family history of malignant neoplasm of breast: Secondary | ICD-10-CM | POA: Insufficient documentation

## 2020-12-01 DIAGNOSIS — Z86718 Personal history of other venous thrombosis and embolism: Secondary | ICD-10-CM | POA: Insufficient documentation

## 2020-12-01 DIAGNOSIS — Z79899 Other long term (current) drug therapy: Secondary | ICD-10-CM | POA: Diagnosis not present

## 2020-12-01 DIAGNOSIS — I10 Essential (primary) hypertension: Secondary | ICD-10-CM | POA: Insufficient documentation

## 2020-12-01 DIAGNOSIS — E119 Type 2 diabetes mellitus without complications: Secondary | ICD-10-CM | POA: Diagnosis not present

## 2020-12-01 DIAGNOSIS — C50511 Malignant neoplasm of lower-outer quadrant of right female breast: Secondary | ICD-10-CM

## 2020-12-01 LAB — COMPREHENSIVE METABOLIC PANEL
ALT: 20 U/L (ref 0–44)
AST: 29 U/L (ref 15–41)
Albumin: 3.5 g/dL (ref 3.5–5.0)
Alkaline Phosphatase: 119 U/L (ref 38–126)
Anion gap: 10 (ref 5–15)
BUN: 17 mg/dL (ref 8–23)
CO2: 23 mmol/L (ref 22–32)
Calcium: 10.1 mg/dL (ref 8.9–10.3)
Chloride: 97 mmol/L — ABNORMAL LOW (ref 98–111)
Creatinine, Ser: 1.04 mg/dL — ABNORMAL HIGH (ref 0.44–1.00)
GFR, Estimated: 59 mL/min — ABNORMAL LOW (ref >60)
Glucose, Bld: 360 mg/dL — ABNORMAL HIGH (ref 70–99)
Potassium: 4.1 mmol/L (ref 3.5–5.1)
Sodium: 130 mmol/L — ABNORMAL LOW (ref 135–145)
Total Bilirubin: 0.5 mg/dL (ref 0.3–1.2)
Total Protein: 7.2 g/dL (ref 6.5–8.1)

## 2020-12-01 LAB — CBC WITH DIFFERENTIAL/PLATELET
Abs Immature Granulocytes: 0 10*3/uL (ref 0.00–0.07)
Basophils Absolute: 0.1 10*3/uL (ref 0.0–0.1)
Basophils Relative: 2 %
Eosinophils Absolute: 0.1 10*3/uL (ref 0.0–0.5)
Eosinophils Relative: 3 %
HCT: 34.4 % — ABNORMAL LOW (ref 36.0–46.0)
Hemoglobin: 11.6 g/dL — ABNORMAL LOW (ref 12.0–15.0)
Immature Granulocytes: 0 %
Lymphocytes Relative: 44 %
Lymphs Abs: 1.4 10*3/uL (ref 0.7–4.0)
MCH: 33 pg (ref 26.0–34.0)
MCHC: 33.7 g/dL (ref 30.0–36.0)
MCV: 98 fL (ref 80.0–100.0)
Monocytes Absolute: 0.2 10*3/uL (ref 0.1–1.0)
Monocytes Relative: 7 %
Neutro Abs: 1.4 10*3/uL — ABNORMAL LOW (ref 1.7–7.7)
Neutrophils Relative %: 44 %
Platelets: 227 10*3/uL (ref 150–400)
RBC: 3.51 MIL/uL — ABNORMAL LOW (ref 3.87–5.11)
RDW: 13.7 % (ref 11.5–15.5)
WBC: 3.2 10*3/uL — ABNORMAL LOW (ref 4.0–10.5)
nRBC: 0 % (ref 0.0–0.2)

## 2020-12-01 MED ORDER — FULVESTRANT 250 MG/5ML IM SOLN
500.0000 mg | Freq: Once | INTRAMUSCULAR | Status: AC
  Administered 2020-12-01: 500 mg via INTRAMUSCULAR
  Filled 2020-12-01: qty 10

## 2020-12-01 NOTE — Progress Notes (Signed)
.[facility] OFFICE PROGRESS NOTE  Patient Care Team: Ezequiel Kayser, MD as PCP - General (Internal Medicine) Neldon Mc, MD (General Surgery) Everlene Farrier, MD (Obstetrics and Gynecology) Noreene Filbert, MD Forest Gleason, MD (Inactive) (Unknown Physician Specialty) [name], MD as Consulting Physician (Hematology and Oncology)  Cancer Staging Carcinoma of lower-outer quadrant of right breast in female, estrogen receptor positive [facility]) Staging form: Breast, AJCC 7th Edition - Pathologic: ypT1c,ypN2a, MX - Signed by Haywood Lasso, MD on 03/10/2012 Cancer stage: ypT1c,ypN2a, MX    Oncology History Overview Note  # DEC 2012- RIGHT BREAST CA T2 N2 M0 tumor from biopsy.  Estrogen receptor positive, Progesterone receptor positive.  Current receptor negative by FISH 2. Neoadjuvant chemotherapy started in December of 28 with Cytoxan Adriamycin 3. Started on Taxol weekly chemotherapy. 4. Patient finished 12  cycles of Taxol chemotherapy in May of 2013.     5. Status post right modified radical mastectomy [Dr.Bowers; [place]] June of 2013, ypT1c  yp N2  MO. started also on Lerazole    7. Radiation therapy to the right breast (September of 2013).  Lymph node was positive for HER-2 receptor gene amplification of 2.22.  Will proceed with Herceptin treatment starting in September of 2013.   8.Patient has finished Herceptin (maintenance therapy) in August of 2014 8. Start patient on letrozole from November, 2013. 9. Patient started on Herceptin in September 2013.    10Patient finished 12 months of Herceptin therapy on August, 2014  # 6. Status post left side prophylactic mastectomy.  #Late MAY 2018-RECURRENCE BREAST CA- ER positive/PR negative; ?? HER-2/neu- [biopsy- proven-mediastinal lymph node; in suff for her 2 testing].  [elevated Tumor marker- CT/PET- uptake in Right Mediastinal LN; Sternum [June 2018 EBUS- Dr.Kasa]   # March 10 2017- faslodex + Abema;  OCT 5th CT-PR of mediastinal LN [consent]  #August 2020-left chest wall nodule biopsy benign  #August 2019- DVT-right upper extremity; Eliquis; stop end of March 2021 [repeat ultrasound negative/patient preference]; # JAN 2022-Cirrhosis- ? On CT scan-FEB 2022MRI liver- Negative fro overt cirrhosis --------------------------------------------------------------- -    DIAGNOSIS: [ BREAST CANCER- ER/PR/HER2 NEU POS  STAGE:  4   ;GOALS: PALLIATIVE  CURRENT/MOST RECENT THERAPY - ABEMA + FASLODEX    Carcinoma of lower-outer quadrant of right breast in female, estrogen receptor positive ([place])   INTERVAL HISTORY:  Kimberly [name] 65 y.o.  female pleasant patient above history of metastatic ER PR positive HER-2/neu positive breast cancer currently on abema+ Faslodex is here for follow-up.  Patient interim patient diagnosed with UTI.  S/p use antibiotics.  Symptoms resolved.  No fever no chills.  No headaches.  No nausea no vomiting.  No diarrhea.  Review of Systems  Constitutional: Positive for malaise/fatigue. Negative for chills, diaphoresis, fever and weight loss.  HENT: Negative for nosebleeds and sore throat.   Eyes: Negative for double vision.  Respiratory: Negative for cough, hemoptysis, sputum production, shortness of breath and wheezing.   Cardiovascular: Negative for chest pain, palpitations, orthopnea and leg swelling.  Gastrointestinal: Negative for abdominal pain, blood in stool, constipation, heartburn, melena, nausea and vomiting.  Musculoskeletal: Positive for back pain and joint pain.  Neurological: Negative for dizziness, tingling, focal weakness, weakness and headaches.  Endo/Heme/Allergies: Does not bruise/bleed easily.  Psychiatric/Behavioral: Negative for depression. The patient is not nervous/anxious and does not have insomnia.       PAST MEDICAL HISTORY :  Past Medical History:  Diagnosis Date  . Arthritis   . Cancer of  lower-outer quadrant of female breast  ([place]) 08/06/2011   RIGHT, chemo and bilateral mastectomies.  . High cholesterol   . History of kidney stones   . Hx of bilateral breast implants   . Hypertension   . Lung metastases ([place]) 20118   chemo pills and faslidex shots.  Marland Kitchen PONV (postoperative nausea and vomiting)   . Type II diabetes mellitus ([facility])    fasting 140-150  . Vertigo    LAST WEEK  . Vertigo 01/2017    PAST SURGICAL HISTORY :   Past Surgical History:  Procedure Laterality Date  . BREAST BIOPSY  07/2011, 2020   right  . BREAST RECONSTRUCTION  03/07/2012   Procedure: BREAST RECONSTRUCTION;  Surgeon: Crissie Reese, MD;  Location: [place];  Service: Plastics;  Laterality: Left;  BREAST RECONSTRUCTION WITH PLACEMENT OF TISSUE EXPANDER TO LEFT BREAST  . BREAST RECONSTRUCTION  02/06/2013  . CESAREAN SECTION  [id number]  . ENDOBRONCHIAL ULTRASOUND N/A 02/24/2017   Procedure: ENDOBRONCHIAL ULTRASOUND;  Surgeon: [name], MD;  Location: [facility];  Service: Cardiopulmonary;  Laterality: N/A;  . LATISSIMUS FLAP TO BREAST Right 02/06/2013   Procedure: LATISSIMUS FLAP TO RIGHT BREAST WITH IMPLANT;  Surgeon: Crissie Reese, MD;  Location: [place];  Service: Plastics;  Laterality: Right;  . MASTECTOMY  03/07/12   modified right; total left  . MODIFIED MASTECTOMY  03/07/2012   Procedure: MODIFIED MASTECTOMY;  Surgeon: Haywood Lasso, MD;  Location: [place];  Service: General;  Laterality: Right;  . PORTACATH PLACEMENT  07/2011  . SCAR REVISION  03/30/2012   Procedure: SCAR REVISION;  Surgeon: Haywood Lasso, MD;  Location: [place];  Service: General;  Laterality: Right;  CLOSURE OF MASTECTOMY INCISION  . WISDOM TOOTH EXTRACTION      FAMILY HISTORY :   Family History  Problem Relation Age of Onset  . Breast cancer Mother   . Diabetes Father     SOCIAL HISTORY:   Social History   Tobacco Use  . Smoking status: Never Smoker  . Smokeless tobacco: Never Used  Vaping Use  . Vaping Use: Never used  Substance  Use Topics  . Alcohol use: No  . Drug use: No    ALLERGIES:  is allergic to sulfa antibiotics.  MEDICATIONS:  Current Outpatient Medications  Medication Sig Dispense Refill  . atorvastatin (LIPITOR) 20 MG tablet Take 1 tablet (20 mg total) by mouth daily. 90 tablet 3  . Calcium Carbonate-Vitamin D (CALCIUM 600 + D PO) Take 1 tablet by mouth 2 (two) times daily.    . fulvestrant (FASLODEX) 250 MG/5ML injection Inject into the muscle every 30 (thirty) days. One injection each buttock over 1-2 minutes. Warm prior to use.    Marland Kitchen glipiZIDE (GLUCOTROL XL) 10 MG 24 hr tablet Take 1 tablet (10 mg total) by mouth 2 (two) times daily. 180 tablet 3  . insulin aspart (NOVOLOG FLEXPEN) 100 UNIT/ML FlexPen Inject 4 Units into the skin 3 (three) times daily with meals. 15 mL 0  . Insulin Detemir (LEVEMIR) 100 UNIT/ML Pen Inject 20 Units into the skin at bedtime. 15 mL 0  . levothyroxine (SYNTHROID) 25 MCG tablet Take 1 tablet (25 mcg total) by mouth daily before breakfast. 90 tablet 1  . lisinopril (ZESTRIL) 10 MG tablet Take 10 mg by mouth daily.    Marland Kitchen VERZENIO 100 MG tablet TAKE 1 TABLET BY MOUTH TWICE DAILY 56 tablet 0  . betamethasone valerate (VALISONE) 0.1 % cream Apply 0.1 application topically  as needed. (Patient not taking: No sig reported)    . loperamide (IMODIUM A-D) 2 MG tablet Take 2 mg by mouth 4 (four) times daily as needed for diarrhea or loose stools. (Patient not taking: Reported on 12/01/2020)    . mupirocin cream (BACTROBAN) 2 % Apply 1 application topically 2 (two) times daily. (Patient not taking: Reported on 12/01/2020) 15 g 0  . mupirocin ointment (BACTROBAN) 2 % mupirocin 2 % topical ointment  APPLY 1 APPLICATION TOPICALLY 3 (THREE) TIMES DAILY. (Patient not taking: Reported on 12/01/2020)    . ondansetron (ZOFRAN) 8 MG tablet Take 1 tablet (8 mg total) by mouth every 8 (eight) hours as needed for nausea or vomiting. (Patient not taking: No sig reported) 20 tablet 3  . ondansetron  (ZOFRAN-ODT) 4 MG disintegrating tablet Take 1 tablet (4 mg total) by mouth every 8 (eight) hours as needed for nausea or vomiting. (Patient not taking: No sig reported) 20 tablet 3  . prochlorperazine (COMPAZINE) 10 MG tablet Take 1 tablet (10 mg total) by mouth every 6 (six) hours as needed for nausea or vomiting. (Patient not taking: No sig reported) 30 tablet 0  . promethazine (PHENERGAN) 25 MG tablet Take 1 tablet (25 mg total) by mouth every 6 (six) hours as needed for refractory nausea / vomiting. (Patient not taking: No sig reported) 30 tablet 0   No current facility-administered medications for this visit.    PHYSICAL EXAMINATION: ECOG PERFORMANCE STATUS: 1 - Symptomatic but completely ambulatory  BP 129/81   Pulse 96   Temp (!) 96.9 F (36.1 C) (Tympanic)   Resp 20   Ht 5' 2" (1.575 m)   Wt 175 lb (79.4 kg)   BMI 32.01 kg/m   Filed Weights   12/01/20 1000  Weight: 175 lb (79.4 kg)    Physical Exam Constitutional:      Comments: She is alone.   HENT:     Head: Normocephalic and atraumatic.     Mouth/Throat:     Pharynx: No oropharyngeal exudate.  Eyes:     Pupils: Pupils are equal, round, and reactive to light.  Cardiovascular:     Rate and Rhythm: Normal rate and regular rhythm.  Pulmonary:     Effort: Pulmonary effort is normal. No respiratory distress.     Breath sounds: Normal breath sounds. No wheezing.  Abdominal:     General: Bowel sounds are normal. There is no distension.     Palpations: Abdomen is soft. There is no mass.     Tenderness: There is no abdominal tenderness. There is no guarding or rebound.  Musculoskeletal:        General: No tenderness. Normal range of motion.     Cervical back: Normal range of motion and neck supple.     Comments: Approximately 1 cm hard nodule felt in the anterior chest wall/left parasternal.  Skin:    General: Skin is warm.     Comments: Maculopapular rash in the left inguinal region/fold.  Neurological:      Mental Status: She is alert and oriented to person, place, and time.  Psychiatric:        Mood and Affect: Affect normal.     LABORATORY DATA:  I have reviewed the data as listed    Component Value Date/Time   NA 130 (L) 12/01/2020 0942   NA 135 11/28/2014 1401   K 4.1 12/01/2020 0942   K 4.7 11/28/2014 1401   CL 97 (L) 12/01/2020 3546  CL 99 (L) 11/28/2014 1401   CO2 23 12/01/2020 0942   CO2 28 11/28/2014 1401   GLUCOSE 360 (H) 12/01/2020 0942   GLUCOSE 215 (H) 11/28/2014 1401   BUN 17 12/01/2020 0942   BUN 16 11/28/2014 1401   CREATININE 1.04 (H) 12/01/2020 0942   CREATININE 0.96 11/28/2014 1401   CALCIUM 10.1 12/01/2020 0942   CALCIUM 9.9 11/28/2014 1401   PROT 7.2 12/01/2020 0942   PROT 7.6 11/28/2014 1401   ALBUMIN 3.5 12/01/2020 0942   ALBUMIN 4.5 11/28/2014 1401   AST 29 12/01/2020 0942   AST 26 11/28/2014 1401   ALT 20 12/01/2020 0942   ALT 28 11/28/2014 1401   ALKPHOS 119 12/01/2020 0942   ALKPHOS 78 11/28/2014 1401   BILITOT 0.5 12/01/2020 0942   BILITOT 0.5 11/28/2014 1401   GFRNONAA 59 (L) 12/01/2020 0942   GFRNONAA >60 11/28/2014 1401   GFRAA >60 05/30/2020 1022   GFRAA >60 11/28/2014 1401    No results found for: SPEP, UPEP  Lab Results  Component Value Date   WBC 3.2 (L) 12/01/2020   NEUTROABS 1.4 (L) 12/01/2020   HGB 11.6 (L) 12/01/2020   HCT 34.4 (L) 12/01/2020   MCV 98.0 12/01/2020   PLT 227 12/01/2020      Chemistry      Component Value Date/Time   NA 130 (L) 12/01/2020 0942   NA 135 11/28/2014 1401   K 4.1 12/01/2020 0942   K 4.7 11/28/2014 1401   CL 97 (L) 12/01/2020 0942   CL 99 (L) 11/28/2014 1401   CO2 23 12/01/2020 0942   CO2 28 11/28/2014 1401   BUN 17 12/01/2020 0942   BUN 16 11/28/2014 1401   CREATININE 1.04 (H) 12/01/2020 0942   CREATININE 0.96 11/28/2014 1401      Component Value Date/Time   CALCIUM 10.1 12/01/2020 0942   CALCIUM 9.9 11/28/2014 1401   ALKPHOS 119 12/01/2020 0942   ALKPHOS 78 11/28/2014 1401    AST 29 12/01/2020 0942   AST 26 11/28/2014 1401   ALT 20 12/01/2020 0942   ALT 28 11/28/2014 1401   BILITOT 0.5 12/01/2020 0942   BILITOT 0.5 11/28/2014 1401       RADIOGRAPHIC STUDIES: I have personally reviewed the radiological images as listed and agreed with the findings in the report. No results found.   ASSESSMENT & PLAN:  Carcinoma of lower-outer quadrant of right breast in female, estrogen receptor positive ([place])  # Recurrent breast cancer-ER positive PR negative ? HER-2/neu-currently on Faslodex plus abema.  JAN 5th 2022-  No evidence of metastatic disease in the chest, abdomen or pelvis STABLE.   # Continue Faslodex; CONTINUE Abema 100 BID; Labs today- ANC- 1.4-; Hb 11.6; platlets-N. Will order scan at next visit  #  Poorly controlled BG- 360- recommend compliance with diet and medications.    #DISPOSITION: # Faslodex/today  # Follow up in 4 weeks-MD/ labs (CBC/CMP/Ca 27-29) Faslodex;-Dr.B   No orders of the defined types were placed in this encounter.  All questions were answered. The patient knows to call the clinic with any problems, questions or concerns.      [name], MD 12/01/2020 2:06 PM

## 2020-12-01 NOTE — Assessment & Plan Note (Addendum)
#  Recurrent breast cancer-ER positive PR negative ? HER-2/neu-currently on Faslodex plus abema.  JAN 5th 2022-  No evidence of metastatic disease in the chest, abdomen or pelvis STABLE.   # Continue Faslodex; CONTINUE Abema 100 BID; Labs today- ANC- 1.4-; Hb 11.6; platlets-N. Will order scan at next visit  #  Poorly controlled BG- 360- recommend compliance with diet and medications.    #DISPOSITION: # Faslodex/today  # Follow up in 4 weeks-MD/ labs (CBC/CMP/Ca 27-29) Faslodex;-Dr.B

## 2020-12-02 ENCOUNTER — Other Ambulatory Visit: Payer: Self-pay | Admitting: Nurse Practitioner

## 2020-12-02 DIAGNOSIS — C50511 Malignant neoplasm of lower-outer quadrant of right female breast: Secondary | ICD-10-CM

## 2020-12-02 DIAGNOSIS — Z170 Estrogen receptor positive status [ER+]: Secondary | ICD-10-CM

## 2020-12-02 LAB — CANCER ANTIGEN 27.29: CA 27.29: 36.7 U/mL (ref 0.0–38.6)

## 2020-12-29 ENCOUNTER — Inpatient Hospital Stay: Payer: PPO | Attending: Internal Medicine

## 2020-12-29 ENCOUNTER — Encounter: Payer: Self-pay | Admitting: Internal Medicine

## 2020-12-29 ENCOUNTER — Inpatient Hospital Stay: Payer: PPO

## 2020-12-29 ENCOUNTER — Inpatient Hospital Stay: Payer: PPO | Admitting: Internal Medicine

## 2020-12-29 VITALS — BP 114/55 | HR 106 | Temp 96.0°F | Resp 17 | Ht 62.0 in | Wt 174.2 lb

## 2020-12-29 DIAGNOSIS — C50511 Malignant neoplasm of lower-outer quadrant of right female breast: Secondary | ICD-10-CM

## 2020-12-29 DIAGNOSIS — Z5111 Encounter for antineoplastic chemotherapy: Secondary | ICD-10-CM | POA: Insufficient documentation

## 2020-12-29 DIAGNOSIS — Z803 Family history of malignant neoplasm of breast: Secondary | ICD-10-CM | POA: Diagnosis not present

## 2020-12-29 DIAGNOSIS — Z79899 Other long term (current) drug therapy: Secondary | ICD-10-CM | POA: Insufficient documentation

## 2020-12-29 DIAGNOSIS — Z170 Estrogen receptor positive status [ER+]: Secondary | ICD-10-CM | POA: Diagnosis not present

## 2020-12-29 DIAGNOSIS — Z9013 Acquired absence of bilateral breasts and nipples: Secondary | ICD-10-CM | POA: Insufficient documentation

## 2020-12-29 DIAGNOSIS — Z923 Personal history of irradiation: Secondary | ICD-10-CM | POA: Insufficient documentation

## 2020-12-29 DIAGNOSIS — Z833 Family history of diabetes mellitus: Secondary | ICD-10-CM | POA: Insufficient documentation

## 2020-12-29 LAB — CBC WITH DIFFERENTIAL/PLATELET
Abs Immature Granulocytes: 0.01 10*3/uL (ref 0.00–0.07)
Basophils Absolute: 0.1 10*3/uL (ref 0.0–0.1)
Basophils Relative: 2 %
Eosinophils Absolute: 0.1 10*3/uL (ref 0.0–0.5)
Eosinophils Relative: 2 %
HCT: 35.4 % — ABNORMAL LOW (ref 36.0–46.0)
Hemoglobin: 12.1 g/dL (ref 12.0–15.0)
Immature Granulocytes: 0 %
Lymphocytes Relative: 42 %
Lymphs Abs: 1.5 10*3/uL (ref 0.7–4.0)
MCH: 33.8 pg (ref 26.0–34.0)
MCHC: 34.2 g/dL (ref 30.0–36.0)
MCV: 98.9 fL (ref 80.0–100.0)
Monocytes Absolute: 0.3 10*3/uL (ref 0.1–1.0)
Monocytes Relative: 8 %
Neutro Abs: 1.6 10*3/uL — ABNORMAL LOW (ref 1.7–7.7)
Neutrophils Relative %: 46 %
Platelets: 249 10*3/uL (ref 150–400)
RBC: 3.58 MIL/uL — ABNORMAL LOW (ref 3.87–5.11)
RDW: 13.1 % (ref 11.5–15.5)
WBC: 3.6 10*3/uL — ABNORMAL LOW (ref 4.0–10.5)
nRBC: 0 % (ref 0.0–0.2)

## 2020-12-29 LAB — COMPREHENSIVE METABOLIC PANEL
ALT: 24 U/L (ref 0–44)
AST: 33 U/L (ref 15–41)
Albumin: 3.6 g/dL (ref 3.5–5.0)
Alkaline Phosphatase: 80 U/L (ref 38–126)
Anion gap: 11 (ref 5–15)
BUN: 21 mg/dL (ref 8–23)
CO2: 21 mmol/L — ABNORMAL LOW (ref 22–32)
Calcium: 9.2 mg/dL (ref 8.9–10.3)
Chloride: 103 mmol/L (ref 98–111)
Creatinine, Ser: 1.19 mg/dL — ABNORMAL HIGH (ref 0.44–1.00)
GFR, Estimated: 50 mL/min — ABNORMAL LOW (ref >60)
Glucose, Bld: 247 mg/dL — ABNORMAL HIGH (ref 70–99)
Potassium: 4 mmol/L (ref 3.5–5.1)
Sodium: 135 mmol/L (ref 135–145)
Total Bilirubin: 0.6 mg/dL (ref 0.3–1.2)
Total Protein: 7.6 g/dL (ref 6.5–8.1)

## 2020-12-29 MED ORDER — FULVESTRANT 250 MG/5ML IM SOLN
500.0000 mg | Freq: Once | INTRAMUSCULAR | Status: AC
  Administered 2020-12-29: 500 mg via INTRAMUSCULAR
  Filled 2020-12-29: qty 10

## 2020-12-29 NOTE — Progress Notes (Signed)
.[facility] OFFICE PROGRESS NOTE  Patient Care Team: Ezequiel Kayser, MD as PCP - General (Internal Medicine) Neldon Mc, MD (General Surgery) Everlene Farrier, MD (Obstetrics and Gynecology) Noreene Filbert, MD Forest Gleason, MD (Inactive) (Unknown Physician Specialty) [name], MD as Consulting Physician (Hematology and Oncology)  Cancer Staging Carcinoma of lower-outer quadrant of right breast in female, estrogen receptor positive [facility]) Staging form: Breast, AJCC 7th Edition - Pathologic: ypT1c,ypN2a, MX - Signed by Haywood Lasso, MD on 03/10/2012 Cancer stage: ypT1c,ypN2a, MX    Oncology History Overview Note  # DEC 2012- RIGHT BREAST CA T2 N2 M0 tumor from biopsy.  Estrogen receptor positive, Progesterone receptor positive.  Current receptor negative by FISH 2. Neoadjuvant chemotherapy started in December of 28 with Cytoxan Adriamycin 3. Started on Taxol weekly chemotherapy. 4. Patient finished 12  cycles of Taxol chemotherapy in May of 2013.     5. Status post right modified radical mastectomy [Dr.Bowers; [place]] June of 2013, ypT1c  yp N2  MO. started also on Lerazole    7. Radiation therapy to the right breast (September of 2013).  Lymph node was positive for HER-2 receptor gene amplification of 2.22.  Will proceed with Herceptin treatment starting in September of 2013.   8.Patient has finished Herceptin (maintenance therapy) in August of 2014 8. Start patient on letrozole from November, 2013. 9. Patient started on Herceptin in September 2013.    10Patient finished 12 months of Herceptin therapy on August, 2014  # 6. Status post left side prophylactic mastectomy.  #Late MAY 2018-RECURRENCE BREAST CA- ER positive/PR negative; ?? HER-2/neu- [biopsy- proven-mediastinal lymph node; in suff for her 2 testing].  [elevated Tumor marker- CT/PET- uptake in Right Mediastinal LN; Sternum [June 2018 EBUS- Dr.Kasa]   # March 10 2017- faslodex + Abema;  OCT 5th CT-PR of mediastinal LN [consent]  #August 2020-left chest wall nodule biopsy benign  #August 2019- DVT-right upper extremity; Eliquis; stop end of March 2021 [repeat ultrasound negative/patient preference]; # JAN 2022-Cirrhosis- ? On CT scan-FEB 2022MRI liver- Negative fro overt cirrhosis --------------------------------------------------------------- -    DIAGNOSIS: [ BREAST CANCER- ER/PR/HER2 NEU POS  STAGE:  4   ;GOALS: PALLIATIVE  CURRENT/MOST RECENT THERAPY - ABEMA + FASLODEX    Carcinoma of lower-outer quadrant of right breast in female, estrogen receptor positive ([place])   INTERVAL HISTORY:  Kimberly [name] 65 y.o.  female pleasant patient above history of metastatic ER PR positive HER-2/neu positive breast cancer currently on abema+ Faslodex is here for follow-up.  Patient denies any unusual chest pain or shortness of breath or cough.  Denies any headaches.  Denies any nausea vomiting diarrhea.  Review of Systems  Constitutional: Positive for malaise/fatigue. Negative for chills, diaphoresis, fever and weight loss.  HENT: Negative for nosebleeds and sore throat.   Eyes: Negative for double vision.  Respiratory: Negative for cough, hemoptysis, sputum production, shortness of breath and wheezing.   Cardiovascular: Negative for chest pain, palpitations, orthopnea and leg swelling.  Gastrointestinal: Negative for abdominal pain, blood in stool, constipation, heartburn, melena, nausea and vomiting.  Musculoskeletal: Positive for back pain and joint pain.  Neurological: Negative for dizziness, tingling, focal weakness, weakness and headaches.  Endo/Heme/Allergies: Does not bruise/bleed easily.  Psychiatric/Behavioral: Negative for depression. The patient is not nervous/anxious and does not have insomnia.       PAST MEDICAL HISTORY :  Past Medical History:  Diagnosis Date  . Arthritis   . Cancer of lower-outer quadrant of female breast ([place]) 08/06/2011  RIGHT,  chemo and bilateral mastectomies.  . High cholesterol   . History of kidney stones   . Hx of bilateral breast implants   . Hypertension   . Lung metastases ([place]) 20118   chemo pills and faslidex shots.  Marland Kitchen PONV (postoperative nausea and vomiting)   . Type II diabetes mellitus ([facility])    fasting 140-150  . Vertigo    LAST WEEK  . Vertigo 01/2017    PAST SURGICAL HISTORY :   Past Surgical History:  Procedure Laterality Date  . BREAST BIOPSY  07/2011, 2020   right  . BREAST RECONSTRUCTION  03/07/2012   Procedure: BREAST RECONSTRUCTION;  Surgeon: Crissie Reese, MD;  Location: [place];  Service: Plastics;  Laterality: Left;  BREAST RECONSTRUCTION WITH PLACEMENT OF TISSUE EXPANDER TO LEFT BREAST  . BREAST RECONSTRUCTION  02/06/2013  . CESAREAN SECTION  [id number]  . ENDOBRONCHIAL ULTRASOUND N/A 02/24/2017   Procedure: ENDOBRONCHIAL ULTRASOUND;  Surgeon: [name], MD;  Location: [facility];  Service: Cardiopulmonary;  Laterality: N/A;  . LATISSIMUS FLAP TO BREAST Right 02/06/2013   Procedure: LATISSIMUS FLAP TO RIGHT BREAST WITH IMPLANT;  Surgeon: Crissie Reese, MD;  Location: [place];  Service: Plastics;  Laterality: Right;  . MASTECTOMY  03/07/12   modified right; total left  . MODIFIED MASTECTOMY  03/07/2012   Procedure: MODIFIED MASTECTOMY;  Surgeon: Haywood Lasso, MD;  Location: [place];  Service: General;  Laterality: Right;  . PORTACATH PLACEMENT  07/2011  . SCAR REVISION  03/30/2012   Procedure: SCAR REVISION;  Surgeon: Haywood Lasso, MD;  Location: [place];  Service: General;  Laterality: Right;  CLOSURE OF MASTECTOMY INCISION  . WISDOM TOOTH EXTRACTION      FAMILY HISTORY :   Family History  Problem Relation Age of Onset  . Breast cancer Mother   . Diabetes Father     SOCIAL HISTORY:   Social History   Tobacco Use  . Smoking status: Never Smoker  . Smokeless tobacco: Never Used  Vaping Use  . Vaping Use: Never used  Substance Use Topics  . Alcohol use:  No  . Drug use: No    ALLERGIES:  is allergic to sulfa antibiotics.  MEDICATIONS:  Current Outpatient Medications  Medication Sig Dispense Refill  . atorvastatin (LIPITOR) 20 MG tablet Take 1 tablet (20 mg total) by mouth daily. 90 tablet 3  . fulvestrant (FASLODEX) 250 MG/5ML injection Inject into the muscle every 30 (thirty) days. One injection each buttock over 1-2 minutes. Warm prior to use.    Marland Kitchen glipiZIDE (GLUCOTROL XL) 10 MG 24 hr tablet Take 1 tablet (10 mg total) by mouth 2 (two) times daily. 180 tablet 3  . insulin aspart (NOVOLOG FLEXPEN) 100 UNIT/ML FlexPen Inject 4 Units into the skin 3 (three) times daily with meals. 15 mL 0  . Insulin Detemir (LEVEMIR) 100 UNIT/ML Pen Inject 20 Units into the skin at bedtime. 15 mL 0  . levothyroxine (SYNTHROID) 25 MCG tablet Take 1 tablet (25 mcg total) by mouth daily before breakfast. 90 tablet 1  . lisinopril (ZESTRIL) 10 MG tablet Take 10 mg by mouth daily.    Marland Kitchen loperamide (IMODIUM A-D) 2 MG tablet Take 2 mg by mouth 4 (four) times daily as needed for diarrhea or loose stools.    . mupirocin ointment (BACTROBAN) 2 %     . ondansetron (ZOFRAN) 8 MG tablet Take 1 tablet (8 mg total) by mouth every 8 (eight) hours as needed for  nausea or vomiting. 20 tablet 3  . VERZENIO 100 MG tablet TAKE 1 TABLET BY MOUTH TWICE DAILY 56 tablet 0  . betamethasone valerate (VALISONE) 0.1 % cream Apply 0.1 application topically as needed. (Patient not taking: No sig reported)    . mupirocin cream (BACTROBAN) 2 % Apply 1 application topically 2 (two) times daily. (Patient not taking: Reported on 12/01/2020) 15 g 0  . ondansetron (ZOFRAN-ODT) 4 MG disintegrating tablet Take 1 tablet (4 mg total) by mouth every 8 (eight) hours as needed for nausea or vomiting. (Patient not taking: No sig reported) 20 tablet 3  . prochlorperazine (COMPAZINE) 10 MG tablet Take 1 tablet (10 mg total) by mouth every 6 (six) hours as needed for nausea or vomiting. (Patient not taking: No  sig reported) 30 tablet 0  . promethazine (PHENERGAN) 25 MG tablet Take 1 tablet (25 mg total) by mouth every 6 (six) hours as needed for refractory nausea / vomiting. (Patient not taking: No sig reported) 30 tablet 0   No current facility-administered medications for this visit.    PHYSICAL EXAMINATION: ECOG PERFORMANCE STATUS: 1 - Symptomatic but completely ambulatory  BP (!) 114/55 (BP Location: Left Arm, Patient Position: Sitting, Cuff Size: Normal)   Pulse (!) 106   Temp (!) 96 F (35.6 C)   Resp 17   Ht _0  (1.575 m)   Wt 174 lb 3.2 oz (79 kg)   SpO2 99%   BMI 31.86 kg/m   Filed Weights   12/29/20 1058  Weight: 174 lb 3.2 oz (79 kg)    Physical Exam Constitutional:      Comments: She is alone.   HENT:     Head: Normocephalic and atraumatic.     Mouth/Throat:     Pharynx: No oropharyngeal exudate.  Eyes:     Pupils: Pupils are equal, round, and reactive to light.  Cardiovascular:     Rate and Rhythm: Normal rate and regular rhythm.  Pulmonary:     Effort: Pulmonary effort is normal. No respiratory distress.     Breath sounds: Normal breath sounds. No wheezing.  Abdominal:     General: Bowel sounds are normal. There is no distension.     Palpations: Abdomen is soft. There is no mass.     Tenderness: There is no abdominal tenderness. There is no guarding or rebound.  Musculoskeletal:        General: No tenderness. Normal range of motion.     Cervical back: Normal range of motion and neck supple.     Comments: Approximately 1 cm hard nodule felt in the anterior chest wall/left parasternal.  Skin:    General: Skin is warm.     Comments: Maculopapular rash in the left inguinal region/fold.  Neurological:     Mental Status: She is alert and oriented to person, place, and time.  Psychiatric:        Mood and Affect: Affect normal.     LABORATORY DATA:  I have reviewed the data as listed    Component Value Date/Time   NA 135 12/29/2020 1006   NA 135  11/28/2014 1401   K 4.0 12/29/2020 1006   K 4.7 11/28/2014 1401   CL 103 12/29/2020 1006   CL 99 (L) 11/28/2014 1401   CO2 21 (L) 12/29/2020 1006   CO2 28 11/28/2014 1401   GLUCOSE 247 (H) 12/29/2020 1006   GLUCOSE 215 (H) 11/28/2014 1401   BUN 21 12/29/2020 1006   BUN 16 11/28/2014 1401  CREATININE 1.19 (H) 12/29/2020 1006   CREATININE 0.96 11/28/2014 1401   CALCIUM 9.2 12/29/2020 1006   CALCIUM 9.9 11/28/2014 1401   PROT 7.6 12/29/2020 1006   PROT 7.6 11/28/2014 1401   ALBUMIN 3.6 12/29/2020 1006   ALBUMIN 4.5 11/28/2014 1401   AST 33 12/29/2020 1006   AST 26 11/28/2014 1401   ALT 24 12/29/2020 1006   ALT 28 11/28/2014 1401   ALKPHOS 80 12/29/2020 1006   ALKPHOS 78 11/28/2014 1401   BILITOT 0.6 12/29/2020 1006   BILITOT 0.5 11/28/2014 1401   GFRNONAA 50 (L) 12/29/2020 1006   GFRNONAA >60 11/28/2014 1401   GFRAA >60 05/30/2020 1022   GFRAA >60 11/28/2014 1401    No results found for: SPEP, UPEP  Lab Results  Component Value Date   WBC 3.6 (L) 12/29/2020   NEUTROABS 1.6 (L) 12/29/2020   HGB 12.1 12/29/2020   HCT 35.4 (L) 12/29/2020   MCV 98.9 12/29/2020   PLT 249 12/29/2020      Chemistry      Component Value Date/Time   NA 135 12/29/2020 1006   NA 135 11/28/2014 1401   K 4.0 12/29/2020 1006   K 4.7 11/28/2014 1401   CL 103 12/29/2020 1006   CL 99 (L) 11/28/2014 1401   CO2 21 (L) 12/29/2020 1006   CO2 28 11/28/2014 1401   BUN 21 12/29/2020 1006   BUN 16 11/28/2014 1401   CREATININE 1.19 (H) 12/29/2020 1006   CREATININE 0.96 11/28/2014 1401      Component Value Date/Time   CALCIUM 9.2 12/29/2020 1006   CALCIUM 9.9 11/28/2014 1401   ALKPHOS 80 12/29/2020 1006   ALKPHOS 78 11/28/2014 1401   AST 33 12/29/2020 1006   AST 26 11/28/2014 1401   ALT 24 12/29/2020 1006   ALT 28 11/28/2014 1401   BILITOT 0.6 12/29/2020 1006   BILITOT 0.5 11/28/2014 1401       RADIOGRAPHIC STUDIES: I have personally reviewed the radiological images as listed and  agreed with the findings in the report. No results found.   ASSESSMENT & PLAN:  Carcinoma of lower-outer quadrant of right breast in female, estrogen receptor positive ([place])  # Recurrent breast cancer-ER positive PR negative ? HER-2/neu-currently on Faslodex plus abema.  JAN 5th 2022-  No evidence of metastatic disease in the chest, abdomen or pelvis- STABLE.    # Continue Faslodex; CONTINUE Abema 100 BID; Labs today- ANC- 1.6-; Hb 11.6; platlets-N. Will order scans today.   #  Poorly controlled BG- 247- recommend continued compliance with diet and medications.    #DISPOSITION: # Faslodex/today  # Follow up in 4 weeks-MD/ labs (CBC/CMP/Ca 27-29) Faslodex; CT C/A/P proir;-Dr.B   Orders Placed This Encounter  Procedures  . CT CHEST ABDOMEN PELVIS W CONTRAST    Standing Status:   Future    Standing Expiration Date:   12/29/2021    Order Specific Question:   Preferred imaging location?    Answer:   [facility]    Order Specific Question:   Radiology Contrast Protocol - do NOT remove file path    Answer:   [email or web address]   All questions were answered. The patient knows to call the clinic with any problems, questions or concerns.      [name], MD 12/29/2020 3:30 PM

## 2020-12-29 NOTE — Assessment & Plan Note (Signed)
#  Recurrent breast cancer-ER positive PR negative ? HER-2/neu-currently on Faslodex plus abema.  JAN 5th 2022-  No evidence of metastatic disease in the chest, abdomen or pelvis- STABLE.    # Continue Faslodex; CONTINUE Abema 100 BID; Labs today- ANC- 1.6-; Hb 11.6; platlets-N. Will order scans today.   #  Poorly controlled BG- 247- recommend continued compliance with diet and medications.    #DISPOSITION: # Faslodex/today  # Follow up in 4 weeks-MD/ labs (CBC/CMP/Ca 27-29) Faslodex; CT C/A/P proir;-Dr.B

## 2020-12-30 LAB — CANCER ANTIGEN 27.29: CA 27.29: 27.9 U/mL (ref 0.0–38.6)

## 2020-12-31 ENCOUNTER — Other Ambulatory Visit: Payer: Self-pay | Admitting: Nurse Practitioner

## 2020-12-31 DIAGNOSIS — C50511 Malignant neoplasm of lower-outer quadrant of right female breast: Secondary | ICD-10-CM

## 2020-12-31 DIAGNOSIS — Z170 Estrogen receptor positive status [ER+]: Secondary | ICD-10-CM

## 2021-01-13 DIAGNOSIS — N390 Urinary tract infection, site not specified: Secondary | ICD-10-CM | POA: Diagnosis not present

## 2021-01-13 DIAGNOSIS — R309 Painful micturition, unspecified: Secondary | ICD-10-CM | POA: Diagnosis not present

## 2021-01-21 ENCOUNTER — Ambulatory Visit
Admission: RE | Admit: 2021-01-21 | Discharge: 2021-01-21 | Disposition: A | Discharge status: Home / Self Care | Payer: PPO | Source: Ambulatory Visit | Attending: Internal Medicine | Admitting: Internal Medicine

## 2021-01-21 ENCOUNTER — Other Ambulatory Visit: Payer: Self-pay

## 2021-01-21 DIAGNOSIS — C50511 Malignant neoplasm of lower-outer quadrant of right female breast: Secondary | ICD-10-CM | POA: Diagnosis not present

## 2021-01-21 DIAGNOSIS — Z170 Estrogen receptor positive status [ER+]: Secondary | ICD-10-CM | POA: Diagnosis not present

## 2021-01-21 DIAGNOSIS — J941 Fibrothorax: Secondary | ICD-10-CM | POA: Diagnosis not present

## 2021-01-21 DIAGNOSIS — I700 Atherosclerosis of aorta: Secondary | ICD-10-CM | POA: Diagnosis not present

## 2021-01-21 DIAGNOSIS — K8020 Calculus of gallbladder without cholecystitis without obstruction: Secondary | ICD-10-CM | POA: Diagnosis not present

## 2021-01-21 DIAGNOSIS — I2510 Atherosclerotic heart disease of native coronary artery without angina pectoris: Secondary | ICD-10-CM | POA: Diagnosis not present

## 2021-01-21 DIAGNOSIS — J984 Other disorders of lung: Secondary | ICD-10-CM | POA: Diagnosis not present

## 2021-01-21 DIAGNOSIS — J701 Chronic and other pulmonary manifestations due to radiation: Secondary | ICD-10-CM | POA: Diagnosis not present

## 2021-01-21 MED ORDER — IOHEXOL 300 MG/ML  SOLN
75.0000 mL | Freq: Once | INTRAMUSCULAR | Status: AC | PRN
  Administered 2021-01-21: 75 mL via INTRAVENOUS

## 2021-01-27 ENCOUNTER — Inpatient Hospital Stay: Payer: PPO

## 2021-01-27 ENCOUNTER — Other Ambulatory Visit: Payer: Self-pay | Admitting: Nurse Practitioner

## 2021-01-27 ENCOUNTER — Encounter: Payer: Self-pay | Admitting: Internal Medicine

## 2021-01-27 ENCOUNTER — Inpatient Hospital Stay: Payer: PPO | Admitting: Internal Medicine

## 2021-01-27 DIAGNOSIS — C50511 Malignant neoplasm of lower-outer quadrant of right female breast: Secondary | ICD-10-CM

## 2021-01-27 DIAGNOSIS — Z170 Estrogen receptor positive status [ER+]: Secondary | ICD-10-CM

## 2021-01-27 DIAGNOSIS — Z5111 Encounter for antineoplastic chemotherapy: Secondary | ICD-10-CM | POA: Diagnosis not present

## 2021-01-27 LAB — CBC WITH DIFFERENTIAL/PLATELET
Abs Immature Granulocytes: 0.01 10*3/uL (ref 0.00–0.07)
Basophils Absolute: 0.1 10*3/uL (ref 0.0–0.1)
Basophils Relative: 2 %
Eosinophils Absolute: 0.1 10*3/uL (ref 0.0–0.5)
Eosinophils Relative: 1 %
HCT: 34.4 % — ABNORMAL LOW (ref 36.0–46.0)
Hemoglobin: 11.9 g/dL — ABNORMAL LOW (ref 12.0–15.0)
Immature Granulocytes: 0 %
Lymphocytes Relative: 52 %
Lymphs Abs: 1.9 10*3/uL (ref 0.7–4.0)
MCH: 33.5 pg (ref 26.0–34.0)
MCHC: 34.6 g/dL (ref 30.0–36.0)
MCV: 96.9 fL (ref 80.0–100.0)
Monocytes Absolute: 0.2 10*3/uL (ref 0.1–1.0)
Monocytes Relative: 6 %
Neutro Abs: 1.4 10*3/uL — ABNORMAL LOW (ref 1.7–7.7)
Neutrophils Relative %: 39 %
Platelets: 205 10*3/uL (ref 150–400)
RBC: 3.55 MIL/uL — ABNORMAL LOW (ref 3.87–5.11)
RDW: 12.8 % (ref 11.5–15.5)
WBC: 3.7 10*3/uL — ABNORMAL LOW (ref 4.0–10.5)
nRBC: 0 % (ref 0.0–0.2)

## 2021-01-27 LAB — COMPREHENSIVE METABOLIC PANEL
ALT: 23 U/L (ref 0–44)
AST: 33 U/L (ref 15–41)
Albumin: 3.5 g/dL (ref 3.5–5.0)
Alkaline Phosphatase: 109 U/L (ref 38–126)
Anion gap: 10 (ref 5–15)
BUN: 16 mg/dL (ref 8–23)
CO2: 23 mmol/L (ref 22–32)
Calcium: 9.9 mg/dL (ref 8.9–10.3)
Chloride: 97 mmol/L — ABNORMAL LOW (ref 98–111)
Creatinine, Ser: 0.98 mg/dL (ref 0.44–1.00)
GFR, Estimated: >60 mL/min (ref >60)
Glucose, Bld: 278 mg/dL — ABNORMAL HIGH (ref 70–99)
Potassium: 3.7 mmol/L (ref 3.5–5.1)
Sodium: 130 mmol/L — ABNORMAL LOW (ref 135–145)
Total Bilirubin: 0.7 mg/dL (ref 0.3–1.2)
Total Protein: 7.2 g/dL (ref 6.5–8.1)

## 2021-01-27 MED ORDER — FULVESTRANT 250 MG/5ML IM SOLN
500.0000 mg | Freq: Once | INTRAMUSCULAR | Status: AC
  Administered 2021-01-27: 500 mg via INTRAMUSCULAR
  Filled 2021-01-27: qty 10

## 2021-01-27 NOTE — Progress Notes (Signed)
.[place] OFFICE PROGRESS NOTE  Patient Care Team: Ezequiel Kayser, MD as PCP - General (Internal Medicine) Neldon Mc, MD (General Surgery) Everlene Farrier, MD (Obstetrics and Gynecology) Noreene Filbert, MD Forest Gleason, MD (Inactive) (Unknown Physician Specialty) [name], MD as Consulting Physician (Hematology and Oncology)  Cancer Staging Carcinoma of lower-outer quadrant of right breast in female, estrogen receptor positive [facility]) Staging form: Breast, AJCC 7th Edition - Pathologic: ypT1c,ypN2a, MX - Signed by Haywood Lasso, MD on 03/10/2012 Cancer stage: ypT1c,ypN2a, MX    Oncology History Overview Note  # DEC 2012- RIGHT BREAST CA T2 N2 M0 tumor from biopsy.  Estrogen receptor positive, Progesterone receptor positive.  Current receptor negative by FISH 2. Neoadjuvant chemotherapy started in December of 28 with Cytoxan Adriamycin 3. Started on Taxol weekly chemotherapy. 4. Patient finished 12  cycles of Taxol chemotherapy in May of 2013.     5. Status post right modified radical mastectomy [Dr.Bowers; [place]] June of 2013, ypT1c  yp N2  MO. started also on Lerazole    7. Radiation therapy to the right breast (September of 2013).  Lymph node was positive for HER-2 receptor gene amplification of 2.22.  Will proceed with Herceptin treatment starting in September of 2013.   8.Patient has finished Herceptin (maintenance therapy) in August of 2014 8. Start patient on letrozole from November, 2013. 9. Patient started on Herceptin in September 2013.    10Patient finished 12 months of Herceptin therapy on August, 2014  # 6. Status post left side prophylactic mastectomy.  #Late MAY 2018-RECURRENCE BREAST CA- ER positive/PR negative; ?? HER-2/neu- [biopsy- proven-mediastinal lymph node; in suff for her 2 testing].  [elevated Tumor marker- CT/PET- uptake in Right Mediastinal LN; Sternum [June 2018 EBUS- Dr.Kasa]   # March 10 2017- faslodex + Abema;  OCT 5th CT-PR of mediastinal LN [consent]  #August 2020-left chest wall nodule biopsy benign  #August 2019- DVT-right upper extremity; Eliquis; stop end of March 2021 [repeat ultrasound negative/patient preference]; # JAN 2022-Cirrhosis- ? On CT scan-FEB 2022MRI liver- Negative fro overt cirrhosis --------------------------------------------------------------- -    DIAGNOSIS: [ BREAST CANCER- ER/PR/HER2 NEU POS  STAGE:  4   ;GOALS: PALLIATIVE  CURRENT/MOST RECENT THERAPY - ABEMA + FASLODEX    Carcinoma of lower-outer quadrant of right breast in female, estrogen receptor positive ([place])   INTERVAL HISTORY:  Kimberly [name] 65 y.o.  female pleasant patient above history of metastatic ER PR positive HER-2/neu positive breast cancer currently on abema+ Faslodex is here for follow-up/review results of the CT scan.  Patient denies any unusual diarrhea.  Denies any nausea vomiting abdominal pain.  No headaches.   Review of Systems  Constitutional: Positive for malaise/fatigue. Negative for chills, diaphoresis, fever and weight loss.  HENT: Negative for nosebleeds and sore throat.   Eyes: Negative for double vision.  Respiratory: Negative for cough, hemoptysis, sputum production, shortness of breath and wheezing.   Cardiovascular: Negative for chest pain, palpitations, orthopnea and leg swelling.  Gastrointestinal: Negative for abdominal pain, blood in stool, constipation, heartburn, melena, nausea and vomiting.  Musculoskeletal: Positive for back pain and joint pain.  Neurological: Negative for dizziness, tingling, focal weakness, weakness and headaches.  Endo/Heme/Allergies: Does not bruise/bleed easily.  Psychiatric/Behavioral: Negative for depression. The patient is not nervous/anxious and does not have insomnia.       PAST MEDICAL HISTORY :  Past Medical History:  Diagnosis Date  . Arthritis   . Cancer of lower-outer quadrant of female breast ([place]) 08/06/2011  RIGHT, chemo  and bilateral mastectomies.  . High cholesterol   . History of kidney stones   . Hx of bilateral breast implants   . Hypertension   . Lung metastases ([place]) 20118   chemo pills and faslidex shots.  Marland Kitchen PONV (postoperative nausea and vomiting)   . Type II diabetes mellitus ([facility])    fasting 140-150  . Vertigo    LAST WEEK  . Vertigo 01/2017    PAST SURGICAL HISTORY :   Past Surgical History:  Procedure Laterality Date  . BREAST BIOPSY  07/2011, 2020   right  . BREAST RECONSTRUCTION  03/07/2012   Procedure: BREAST RECONSTRUCTION;  Surgeon: Crissie Reese, MD;  Location: [place];  Service: Plastics;  Laterality: Left;  BREAST RECONSTRUCTION WITH PLACEMENT OF TISSUE EXPANDER TO LEFT BREAST  . BREAST RECONSTRUCTION  02/06/2013  . CESAREAN SECTION  [id number]  . ENDOBRONCHIAL ULTRASOUND N/A 02/24/2017   Procedure: ENDOBRONCHIAL ULTRASOUND;  Surgeon: [name], MD;  Location: [facility];  Service: Cardiopulmonary;  Laterality: N/A;  . LATISSIMUS FLAP TO BREAST Right 02/06/2013   Procedure: LATISSIMUS FLAP TO RIGHT BREAST WITH IMPLANT;  Surgeon: Crissie Reese, MD;  Location: [place];  Service: Plastics;  Laterality: Right;  . MASTECTOMY  03/07/12   modified right; total left  . MODIFIED MASTECTOMY  03/07/2012   Procedure: MODIFIED MASTECTOMY;  Surgeon: Haywood Lasso, MD;  Location: [place];  Service: General;  Laterality: Right;  . PORTACATH PLACEMENT  07/2011  . SCAR REVISION  03/30/2012   Procedure: SCAR REVISION;  Surgeon: Haywood Lasso, MD;  Location: [place];  Service: General;  Laterality: Right;  CLOSURE OF MASTECTOMY INCISION  . WISDOM TOOTH EXTRACTION      FAMILY HISTORY :   Family History  Problem Relation Age of Onset  . Breast cancer Mother   . Diabetes Father     SOCIAL HISTORY:   Social History   Tobacco Use  . Smoking status: Never Smoker  . Smokeless tobacco: Never Used  Vaping Use  . Vaping Use: Never used  Substance Use Topics  . Alcohol use: No   . Drug use: No    ALLERGIES:  is allergic to sulfa antibiotics.  MEDICATIONS:  Current Outpatient Medications  Medication Sig Dispense Refill  . atorvastatin (LIPITOR) 20 MG tablet Take 1 tablet (20 mg total) by mouth daily. 90 tablet 3  . betamethasone valerate (VALISONE) 0.1 % cream Apply 0.1 application topically as needed.    . fulvestrant (FASLODEX) 250 MG/5ML injection Inject into the muscle every 30 (thirty) days. One injection each buttock over 1-2 minutes. Warm prior to use.    Marland Kitchen glipiZIDE (GLUCOTROL XL) 10 MG 24 hr tablet Take 1 tablet (10 mg total) by mouth 2 (two) times daily. 180 tablet 3  . insulin aspart (NOVOLOG FLEXPEN) 100 UNIT/ML FlexPen Inject 4 Units into the skin 3 (three) times daily with meals. 15 mL 0  . Insulin Detemir (LEVEMIR) 100 UNIT/ML Pen Inject 20 Units into the skin at bedtime. 15 mL 0  . levothyroxine (SYNTHROID) 25 MCG tablet Take 1 tablet (25 mcg total) by mouth daily before breakfast. 90 tablet 1  . lisinopril (ZESTRIL) 10 MG tablet Take 10 mg by mouth daily.    Marland Kitchen loperamide (IMODIUM A-D) 2 MG tablet Take 2 mg by mouth 4 (four) times daily as needed for diarrhea or loose stools.    . mupirocin ointment (BACTROBAN) 2 %     . ondansetron (ZOFRAN) 8 MG  tablet Take 1 tablet (8 mg total) by mouth every 8 (eight) hours as needed for nausea or vomiting. 20 tablet 3  . ondansetron (ZOFRAN-ODT) 4 MG disintegrating tablet Take 1 tablet (4 mg total) by mouth every 8 (eight) hours as needed for nausea or vomiting. 20 tablet 3  . prochlorperazine (COMPAZINE) 10 MG tablet Take 1 tablet (10 mg total) by mouth every 6 (six) hours as needed for nausea or vomiting. 30 tablet 0  . promethazine (PHENERGAN) 25 MG tablet Take 1 tablet (25 mg total) by mouth every 6 (six) hours as needed for refractory nausea / vomiting. 30 tablet 0  . VERZENIO 100 MG tablet TAKE 1 TABLET BY MOUTH TWICE DAILY 56 tablet 0  . mupirocin cream (BACTROBAN) 2 % Apply 1 application topically 2 (two)  times daily. (Patient not taking: No sig reported) 15 g 0   No current facility-administered medications for this visit.    PHYSICAL EXAMINATION: ECOG PERFORMANCE STATUS: 1 - Symptomatic but completely ambulatory  BP 118/76   Pulse 95   Temp 97.8 F (36.6 C) (Tympanic)   Resp 16   Wt 78.7 kg   BMI 31.72 kg/m   Filed Weights   01/27/21 1011  Weight: 78.7 kg    Physical Exam Constitutional:      Comments: She is alone.   HENT:     Head: Normocephalic and atraumatic.     Mouth/Throat:     Pharynx: No oropharyngeal exudate.  Eyes:     Pupils: Pupils are equal, round, and reactive to light.  Cardiovascular:     Rate and Rhythm: Normal rate and regular rhythm.  Pulmonary:     Effort: Pulmonary effort is normal. No respiratory distress.     Breath sounds: Normal breath sounds. No wheezing.  Abdominal:     General: Bowel sounds are normal. There is no distension.     Palpations: Abdomen is soft. There is no mass.     Tenderness: There is no abdominal tenderness. There is no guarding or rebound.  Musculoskeletal:        General: No tenderness. Normal range of motion.     Cervical back: Normal range of motion and neck supple.     Comments: Approximately 1 cm hard nodule felt in the anterior chest wall/left parasternal.  Skin:    General: Skin is warm.     Comments: Maculopapular rash in the left inguinal region/fold.  Neurological:     Mental Status: She is alert and oriented to person, place, and time.  Psychiatric:        Mood and Affect: Affect normal.     LABORATORY DATA:  I have reviewed the data as listed    Component Value Date/Time   NA 130 (L) 01/27/2021 0956   NA 135 11/28/2014 1401   K 3.7 01/27/2021 0956   K 4.7 11/28/2014 1401   CL 97 (L) 01/27/2021 0956   CL 99 (L) 11/28/2014 1401   CO2 23 01/27/2021 0956   CO2 28 11/28/2014 1401   GLUCOSE 278 (H) 01/27/2021 0956   GLUCOSE 215 (H) 11/28/2014 1401   BUN 16 01/27/2021 0956   BUN 16 11/28/2014  1401   CREATININE 0.98 01/27/2021 0956   CREATININE 0.96 11/28/2014 1401   CALCIUM 9.9 01/27/2021 0956   CALCIUM 9.9 11/28/2014 1401   PROT 7.2 01/27/2021 0956   PROT 7.6 11/28/2014 1401   ALBUMIN 3.5 01/27/2021 0956   ALBUMIN 4.5 11/28/2014 1401   AST 33 01/27/2021 0956  AST 26 11/28/2014 1401   ALT 23 01/27/2021 0956   ALT 28 11/28/2014 1401   ALKPHOS 109 01/27/2021 0956   ALKPHOS 78 11/28/2014 1401   BILITOT 0.7 01/27/2021 0956   BILITOT 0.5 11/28/2014 1401   GFRNONAA >60 01/27/2021 0956   GFRNONAA >60 11/28/2014 1401   GFRAA >60 05/30/2020 1022   GFRAA >60 11/28/2014 1401    No results found for: SPEP, UPEP  Lab Results  Component Value Date   WBC 3.7 (L) 01/27/2021   NEUTROABS 1.4 (L) 01/27/2021   HGB 11.9 (L) 01/27/2021   HCT 34.4 (L) 01/27/2021   MCV 96.9 01/27/2021   PLT 205 01/27/2021      Chemistry      Component Value Date/Time   NA 130 (L) 01/27/2021 0956   NA 135 11/28/2014 1401   K 3.7 01/27/2021 0956   K 4.7 11/28/2014 1401   CL 97 (L) 01/27/2021 0956   CL 99 (L) 11/28/2014 1401   CO2 23 01/27/2021 0956   CO2 28 11/28/2014 1401   BUN 16 01/27/2021 0956   BUN 16 11/28/2014 1401   CREATININE 0.98 01/27/2021 0956   CREATININE 0.96 11/28/2014 1401      Component Value Date/Time   CALCIUM 9.9 01/27/2021 0956   CALCIUM 9.9 11/28/2014 1401   ALKPHOS 109 01/27/2021 0956   ALKPHOS 78 11/28/2014 1401   AST 33 01/27/2021 0956   AST 26 11/28/2014 1401   ALT 23 01/27/2021 0956   ALT 28 11/28/2014 1401   BILITOT 0.7 01/27/2021 0956   BILITOT 0.5 11/28/2014 1401       RADIOGRAPHIC STUDIES: I have personally reviewed the radiological images as listed and agreed with the findings in the report. No results found.   ASSESSMENT & PLAN:  Carcinoma of lower-outer quadrant of right breast in female, estrogen receptor positive ([place])  # Recurrent breast cancer-ER positive PR negative ? HER-2/neu-currently on Faslodex plus abema. MARCH 2022-  No evidence  of metastatic disease in the chest, abdomen or pelvis- STABLE   # Continue Faslodex; CONTINUE Abema 100 BID; Labs today- ANC- 1.4-; Hb 11.6; platlets-N.   #  Poorly controlled BG- 278- awaiting endocrinology evaluation; with diet and medications.   * [place]  #DISPOSITION:early AM appt # Faslodex/today  # Follow up in 4 weeks-MD/ labs (CBC/CMP/Ca 27-29) Faslodex; -Dr.B   # I reviewed the blood work- with the patient in detail; also reviewed the imaging independently [as summarized above]; and with the patient in detail.    No orders of the defined types were placed in this encounter.  All questions were answered. The patient knows to call the clinic with any problems, questions or concerns.      [name], MD 01/27/2021 12:48 PM

## 2021-01-27 NOTE — Assessment & Plan Note (Addendum)
#  Recurrent breast cancer-ER positive PR negative ? HER-2/neu-currently on Faslodex plus abema. MARCH 2022-  No evidence of metastatic disease in the chest, abdomen or pelvis- STABLE   # Continue Faslodex; CONTINUE Abema 100 BID; Labs today- ANC- 1.4-; Hb 11.6; platlets-N.   #  Poorly controlled BG- 278- awaiting endocrinology evaluation; with diet and medications.   * [place]  #DISPOSITION:early AM appt # Faslodex/today  # Follow up in 4 weeks-MD/ labs (CBC/CMP/Ca 27-29) Faslodex; -Dr.B   # I reviewed the blood work- with the patient in detail; also reviewed the imaging independently [as summarized above]; and with the patient in detail.

## 2021-01-27 NOTE — Progress Notes (Signed)
Patient here for results no complaints today. 

## 2021-01-28 ENCOUNTER — Encounter: Payer: Self-pay | Admitting: Internal Medicine

## 2021-01-28 LAB — CANCER ANTIGEN 27.29: CA 27.29: 24.8 U/mL (ref 0.0–38.6)

## 2021-02-23 ENCOUNTER — Other Ambulatory Visit: Payer: Self-pay | Admitting: Nurse Practitioner

## 2021-02-23 DIAGNOSIS — C50511 Malignant neoplasm of lower-outer quadrant of right female breast: Secondary | ICD-10-CM

## 2021-02-24 ENCOUNTER — Inpatient Hospital Stay: Payer: PPO

## 2021-02-24 ENCOUNTER — Other Ambulatory Visit: Payer: Self-pay

## 2021-02-24 ENCOUNTER — Encounter: Payer: Self-pay | Admitting: Internal Medicine

## 2021-02-24 ENCOUNTER — Inpatient Hospital Stay: Payer: PPO | Attending: Internal Medicine | Admitting: Internal Medicine

## 2021-02-24 DIAGNOSIS — Z79899 Other long term (current) drug therapy: Secondary | ICD-10-CM | POA: Insufficient documentation

## 2021-02-24 DIAGNOSIS — Z86718 Personal history of other venous thrombosis and embolism: Secondary | ICD-10-CM | POA: Insufficient documentation

## 2021-02-24 DIAGNOSIS — Z803 Family history of malignant neoplasm of breast: Secondary | ICD-10-CM | POA: Insufficient documentation

## 2021-02-24 DIAGNOSIS — C50511 Malignant neoplasm of lower-outer quadrant of right female breast: Secondary | ICD-10-CM

## 2021-02-24 DIAGNOSIS — E119 Type 2 diabetes mellitus without complications: Secondary | ICD-10-CM | POA: Insufficient documentation

## 2021-02-24 DIAGNOSIS — E7800 Pure hypercholesterolemia, unspecified: Secondary | ICD-10-CM | POA: Insufficient documentation

## 2021-02-24 DIAGNOSIS — Z9221 Personal history of antineoplastic chemotherapy: Secondary | ICD-10-CM | POA: Diagnosis not present

## 2021-02-24 DIAGNOSIS — I10 Essential (primary) hypertension: Secondary | ICD-10-CM | POA: Insufficient documentation

## 2021-02-24 DIAGNOSIS — Z833 Family history of diabetes mellitus: Secondary | ICD-10-CM | POA: Diagnosis not present

## 2021-02-24 DIAGNOSIS — Z9013 Acquired absence of bilateral breasts and nipples: Secondary | ICD-10-CM | POA: Insufficient documentation

## 2021-02-24 DIAGNOSIS — Z5111 Encounter for antineoplastic chemotherapy: Secondary | ICD-10-CM | POA: Diagnosis not present

## 2021-02-24 DIAGNOSIS — Z923 Personal history of irradiation: Secondary | ICD-10-CM | POA: Insufficient documentation

## 2021-02-24 DIAGNOSIS — Z794 Long term (current) use of insulin: Secondary | ICD-10-CM | POA: Diagnosis not present

## 2021-02-24 DIAGNOSIS — Z170 Estrogen receptor positive status [ER+]: Secondary | ICD-10-CM

## 2021-02-24 LAB — CBC WITH DIFFERENTIAL/PLATELET
Abs Immature Granulocytes: 0.01 10*3/uL (ref 0.00–0.07)
Basophils Absolute: 0.1 10*3/uL (ref 0.0–0.1)
Basophils Relative: 2 %
Eosinophils Absolute: 0.1 10*3/uL (ref 0.0–0.5)
Eosinophils Relative: 2 %
HCT: 33.6 % — ABNORMAL LOW (ref 36.0–46.0)
Hemoglobin: 11.4 g/dL — ABNORMAL LOW (ref 12.0–15.0)
Immature Granulocytes: 0 %
Lymphocytes Relative: 51 %
Lymphs Abs: 1.4 10*3/uL (ref 0.7–4.0)
MCH: 33.4 pg (ref 26.0–34.0)
MCHC: 33.9 g/dL (ref 30.0–36.0)
MCV: 98.5 fL (ref 80.0–100.0)
Monocytes Absolute: 0.2 10*3/uL (ref 0.1–1.0)
Monocytes Relative: 6 %
Neutro Abs: 1.1 10*3/uL — ABNORMAL LOW (ref 1.7–7.7)
Neutrophils Relative %: 39 %
Platelets: 202 10*3/uL (ref 150–400)
RBC: 3.41 MIL/uL — ABNORMAL LOW (ref 3.87–5.11)
RDW: 13.2 % (ref 11.5–15.5)
WBC: 2.8 10*3/uL — ABNORMAL LOW (ref 4.0–10.5)
nRBC: 0 % (ref 0.0–0.2)

## 2021-02-24 LAB — COMPREHENSIVE METABOLIC PANEL
ALT: 25 U/L (ref 0–44)
AST: 39 U/L (ref 15–41)
Albumin: 3.4 g/dL — ABNORMAL LOW (ref 3.5–5.0)
Alkaline Phosphatase: 114 U/L (ref 38–126)
Anion gap: 10 (ref 5–15)
BUN: 20 mg/dL (ref 8–23)
CO2: 22 mmol/L (ref 22–32)
Calcium: 9.4 mg/dL (ref 8.9–10.3)
Chloride: 103 mmol/L (ref 98–111)
Creatinine, Ser: 1.16 mg/dL — ABNORMAL HIGH (ref 0.44–1.00)
GFR, Estimated: 52 mL/min — ABNORMAL LOW (ref >60)
Glucose, Bld: 243 mg/dL — ABNORMAL HIGH (ref 70–99)
Potassium: 3.9 mmol/L (ref 3.5–5.1)
Sodium: 135 mmol/L (ref 135–145)
Total Bilirubin: 0.5 mg/dL (ref 0.3–1.2)
Total Protein: 6.8 g/dL (ref 6.5–8.1)

## 2021-02-24 MED ORDER — FULVESTRANT 250 MG/5ML IM SOLN
500.0000 mg | Freq: Once | INTRAMUSCULAR | Status: AC
  Administered 2021-02-24: 500 mg via INTRAMUSCULAR
  Filled 2021-02-24: qty 10

## 2021-02-24 NOTE — Assessment & Plan Note (Addendum)
#  Recurrent breast cancer-ER positive PR negative ? HER-2/neu-currently on Faslodex plus abema. MARCH 2022-  No evidence of metastatic disease in the chest, abdomen or pelvis-stable  # Continue Faslodex; CONTINUE Abema 100 BID; Labs today- ANC- 1.1-; Hb 11.2; platlets-N- STABLE.  Will order scans at next visit.  Tumor markers improved/stable  #  Poorly controlled BG- 243- awaiting endocrinology evaluation in aiugust; with diet and medications.    #DISPOSITION:early AM appt # Faslodex/today  # Follow up in 4 weeks [[place]]-MD/ labs (CBC/CMP/Ca 27-29) Faslodex; -Dr.B

## 2021-02-25 ENCOUNTER — Encounter: Payer: Self-pay | Admitting: Internal Medicine

## 2021-02-25 LAB — CANCER ANTIGEN 27.29: CA 27.29: 20.6 U/mL (ref 0.0–38.6)

## 2021-02-25 NOTE — Progress Notes (Signed)
.[place] OFFICE PROGRESS NOTE  Patient Care Team: Ezequiel Kayser, MD as PCP - General (Internal Medicine) Neldon Mc, MD (General Surgery) Everlene Farrier, MD (Obstetrics and Gynecology) Noreene Filbert, MD Forest Gleason, MD (Inactive) (Unknown Physician Specialty) [name], MD as Consulting Physician (Hematology and Oncology)  Cancer Staging Carcinoma of lower-outer quadrant of right breast in female, estrogen receptor positive [facility]) Staging form: Breast, AJCC 7th Edition - Pathologic: ypT1c,ypN2a, MX - Signed by Haywood Lasso, MD on 03/10/2012 Cancer stage: ypT1c,ypN2a, MX    Oncology History Overview Note  # DEC 2012- RIGHT BREAST CA T2 N2 M0 tumor from biopsy.  Estrogen receptor positive, Progesterone receptor positive.  Current receptor negative by FISH 2. Neoadjuvant chemotherapy started in December of 28 with Cytoxan Adriamycin 3. Started on Taxol weekly chemotherapy. 4. Patient finished 12  cycles of Taxol chemotherapy in May of 2013.     5. Status post right modified radical mastectomy [Dr.Bowers; [place]] June of 2013, ypT1c  yp N2  MO. started also on Lerazole    7. Radiation therapy to the right breast (September of 2013).  Lymph node was positive for HER-2 receptor gene amplification of 2.22.  Will proceed with Herceptin treatment starting in September of 2013.   8.Patient has finished Herceptin (maintenance therapy) in August of 2014 8. Start patient on letrozole from November, 2013. 9. Patient started on Herceptin in September 2013.    10Patient finished 12 months of Herceptin therapy on August, 2014  # 6. Status post left side prophylactic mastectomy.  #Late MAY 2018-RECURRENCE BREAST CA- ER positive/PR negative; ?? HER-2/neu- [biopsy- proven-mediastinal lymph node; in suff for her 2 testing].  [elevated Tumor marker- CT/PET- uptake in Right Mediastinal LN; Sternum [June 2018 EBUS- Dr.Kasa]   # March 10 2017- faslodex + Abema;  OCT 5th CT-PR of mediastinal LN [consent]  #August 2020-left chest wall nodule biopsy benign  #August 2019- DVT-right upper extremity; Eliquis; stop end of March 2021 [repeat ultrasound negative/patient preference]; # JAN 2022-Cirrhosis- ? On CT scan-FEB 2022MRI liver- Negative fro overt cirrhosis --------------------------------------------------------------- -    DIAGNOSIS: [ BREAST CANCER- ER/PR/HER2 NEU POS  STAGE:  4   ;GOALS: PALLIATIVE  CURRENT/MOST RECENT THERAPY - ABEMA + FASLODEX    Carcinoma of lower-outer quadrant of right breast in female, estrogen receptor positive ([place])   INTERVAL HISTORY:  Kimberly [name] 65 y.o.  female pleasant patient above history of metastatic ER PR positive HER-2/neu positive breast cancer currently on abema+ Faslodex is here for follow-up.  Patient denies any nausea vomiting or diarrhea.  Denies any fevers or chills.  Chronic mild fatigue.  No worsening headaches.    Review of Systems  Constitutional:  Positive for malaise/fatigue. Negative for chills, diaphoresis, fever and weight loss.  HENT:  Negative for nosebleeds and sore throat.   Eyes:  Negative for double vision.  Respiratory:  Negative for cough, hemoptysis, sputum production, shortness of breath and wheezing.   Cardiovascular:  Negative for chest pain, palpitations, orthopnea and leg swelling.  Gastrointestinal:  Negative for abdominal pain, blood in stool, constipation, heartburn, melena, nausea and vomiting.  Musculoskeletal:  Positive for back pain and joint pain.  Neurological:  Negative for dizziness, tingling, focal weakness, weakness and headaches.  Endo/Heme/Allergies:  Does not bruise/bleed easily.  Psychiatric/Behavioral:  Negative for depression. The patient is not nervous/anxious and does not have insomnia.      PAST MEDICAL HISTORY :  Past Medical History:  Diagnosis Date   Arthritis  Cancer of lower-outer quadrant of female breast ([place]) 08/06/2011   RIGHT,  chemo and bilateral mastectomies.   High cholesterol    History of kidney stones    Hx of bilateral breast implants    Hypertension    Lung metastases ([place]) 20118   chemo pills and faslidex shots.   PONV (postoperative nausea and vomiting)    Type II diabetes mellitus ([place])    fasting 140-150   Vertigo    LAST WEEK   Vertigo 01/2017    PAST SURGICAL HISTORY :   Past Surgical History:  Procedure Laterality Date   BREAST BIOPSY  07/2011, 2020   right   BREAST RECONSTRUCTION  03/07/2012   Procedure: BREAST RECONSTRUCTION;  Surgeon: Crissie Reese, MD;  Location: [place];  Service: Plastics;  Laterality: Left;  BREAST RECONSTRUCTION WITH PLACEMENT OF TISSUE EXPANDER TO LEFT BREAST   BREAST RECONSTRUCTION  02/06/2013   CESAREAN SECTION  [id number]   ENDOBRONCHIAL ULTRASOUND N/A 02/24/2017   Procedure: ENDOBRONCHIAL ULTRASOUND;  Surgeon: [name], MD;  Location: [facility];  Service: Cardiopulmonary;  Laterality: N/A;   LATISSIMUS FLAP TO BREAST Right 02/06/2013   Procedure: LATISSIMUS FLAP TO RIGHT BREAST WITH IMPLANT;  Surgeon: Crissie Reese, MD;  Location: [place];  Service: Plastics;  Laterality: Right;   MASTECTOMY  03/07/12   modified right; total left   MODIFIED MASTECTOMY  03/07/2012   Procedure: MODIFIED MASTECTOMY;  Surgeon: Haywood Lasso, MD;  Location: [place];  Service: General;  Laterality: Right;   PORTACATH PLACEMENT  07/2011   SCAR REVISION  03/30/2012   Procedure: SCAR REVISION;  Surgeon: Haywood Lasso, MD;  Location: [place];  Service: General;  Laterality: Right;  CLOSURE OF MASTECTOMY INCISION   WISDOM TOOTH EXTRACTION      FAMILY HISTORY :   Family History  Problem Relation Age of Onset   Breast cancer Mother    Diabetes Father     SOCIAL HISTORY:   Social History   Tobacco Use   Smoking status: Never   Smokeless tobacco: Never  Vaping Use   Vaping Use: Never used  Substance Use Topics   Alcohol use: No   Drug use: No    ALLERGIES:   is allergic to sulfa antibiotics.  MEDICATIONS:  Current Outpatient Medications  Medication Sig Dispense Refill   atorvastatin (LIPITOR) 20 MG tablet Take 1 tablet (20 mg total) by mouth daily. 90 tablet 3   betamethasone valerate (VALISONE) 0.1 % cream Apply 0.1 application topically as needed.     fulvestrant (FASLODEX) 250 MG/5ML injection Inject into the muscle every 30 (thirty) days. One injection each buttock over 1-2 minutes. Warm prior to use.     glipiZIDE (GLUCOTROL XL) 10 MG 24 hr tablet Take 1 tablet (10 mg total) by mouth 2 (two) times daily. 180 tablet 3   insulin aspart (NOVOLOG FLEXPEN) 100 UNIT/ML FlexPen Inject 4 Units into the skin 3 (three) times daily with meals. 15 mL 0   Insulin Detemir (LEVEMIR) 100 UNIT/ML Pen Inject 20 Units into the skin at bedtime. 15 mL 0   levothyroxine (SYNTHROID) 25 MCG tablet Take 1 tablet (25 mcg total) by mouth daily before breakfast. 90 tablet 1   lisinopril (ZESTRIL) 10 MG tablet Take 10 mg by mouth daily.     loperamide (IMODIUM A-D) 2 MG tablet Take 2 mg by mouth 4 (four) times daily as needed for diarrhea or loose stools.     mupirocin cream (BACTROBAN) 2 %  Apply 1 application topically 2 (two) times daily. 15 g 0   mupirocin ointment (BACTROBAN) 2 %      ondansetron (ZOFRAN) 8 MG tablet Take 1 tablet (8 mg total) by mouth every 8 (eight) hours as needed for nausea or vomiting. 20 tablet 3   ondansetron (ZOFRAN-ODT) 4 MG disintegrating tablet Take 1 tablet (4 mg total) by mouth every 8 (eight) hours as needed for nausea or vomiting. 20 tablet 3   prochlorperazine (COMPAZINE) 10 MG tablet Take 1 tablet (10 mg total) by mouth every 6 (six) hours as needed for nausea or vomiting. 30 tablet 0   promethazine (PHENERGAN) 25 MG tablet Take 1 tablet (25 mg total) by mouth every 6 (six) hours as needed for refractory nausea / vomiting. 30 tablet 0   VERZENIO 100 MG tablet TAKE 1 TABLET BY MOUTH TWICE DAILY 56 tablet 0   No current  facility-administered medications for this visit.    PHYSICAL EXAMINATION: ECOG PERFORMANCE STATUS: 1 - Symptomatic but completely ambulatory  BP 121/69 (BP Location: Left Arm, Patient Position: Sitting, Cuff Size: Normal)   Pulse 94   Temp 97.6 F (36.4 C) (Tympanic)   Resp 16   Ht '5\' 2"'  (1.575 m)   Wt 173 lb (78.5 kg)   SpO2 99%   BMI 31.64 kg/m   Filed Weights   02/24/21 0830  Weight: 173 lb (78.5 kg)    Physical Exam Constitutional:      Comments: She is alone.   HENT:     Head: Normocephalic and atraumatic.     Mouth/Throat:     Pharynx: No oropharyngeal exudate.  Eyes:     Pupils: Pupils are equal, round, and reactive to light.  Cardiovascular:     Rate and Rhythm: Normal rate and regular rhythm.  Pulmonary:     Effort: Pulmonary effort is normal. No respiratory distress.     Breath sounds: Normal breath sounds. No wheezing.  Abdominal:     General: Bowel sounds are normal. There is no distension.     Palpations: Abdomen is soft. There is no mass.     Tenderness: There is no abdominal tenderness. There is no guarding or rebound.  Musculoskeletal:        General: No tenderness. Normal range of motion.     Cervical back: Normal range of motion and neck supple.     Comments: Approximately 1 cm hard nodule felt in the anterior chest wall/left parasternal.  Skin:    General: Skin is warm.     Comments: Maculopapular rash in the left inguinal region/fold.  Neurological:     Mental Status: She is alert and oriented to person, place, and time.  Psychiatric:        Mood and Affect: Affect normal.    LABORATORY DATA:  I have reviewed the data as listed    Component Value Date/Time   NA 135 02/24/2021 0801   NA 135 11/28/2014 1401   K 3.9 02/24/2021 0801   K 4.7 11/28/2014 1401   CL 103 02/24/2021 0801   CL 99 (L) 11/28/2014 1401   CO2 22 02/24/2021 0801   CO2 28 11/28/2014 1401   GLUCOSE 243 (H) 02/24/2021 0801   GLUCOSE 215 (H) 11/28/2014 1401   BUN 20  02/24/2021 0801   BUN 16 11/28/2014 1401   CREATININE 1.16 (H) 02/24/2021 0801   CREATININE 0.96 11/28/2014 1401   CALCIUM 9.4 02/24/2021 0801   CALCIUM 9.9 11/28/2014 1401   PROT 6.8  02/24/2021 0801   PROT 7.6 11/28/2014 1401   ALBUMIN 3.4 (L) 02/24/2021 0801   ALBUMIN 4.5 11/28/2014 1401   AST 39 02/24/2021 0801   AST 26 11/28/2014 1401   ALT 25 02/24/2021 0801   ALT 28 11/28/2014 1401   ALKPHOS 114 02/24/2021 0801   ALKPHOS 78 11/28/2014 1401   BILITOT 0.5 02/24/2021 0801   BILITOT 0.5 11/28/2014 1401   GFRNONAA 52 (L) 02/24/2021 0801   GFRNONAA >60 11/28/2014 1401   GFRAA >60 05/30/2020 1022   GFRAA >60 11/28/2014 1401    No results found for: SPEP, UPEP  Lab Results  Component Value Date   WBC 2.8 (L) 02/24/2021   NEUTROABS 1.1 (L) 02/24/2021   HGB 11.4 (L) 02/24/2021   HCT 33.6 (L) 02/24/2021   MCV 98.5 02/24/2021   PLT 202 02/24/2021      Chemistry      Component Value Date/Time   NA 135 02/24/2021 0801   NA 135 11/28/2014 1401   K 3.9 02/24/2021 0801   K 4.7 11/28/2014 1401   CL 103 02/24/2021 0801   CL 99 (L) 11/28/2014 1401   CO2 22 02/24/2021 0801   CO2 28 11/28/2014 1401   BUN 20 02/24/2021 0801   BUN 16 11/28/2014 1401   CREATININE 1.16 (H) 02/24/2021 0801   CREATININE 0.96 11/28/2014 1401      Component Value Date/Time   CALCIUM 9.4 02/24/2021 0801   CALCIUM 9.9 11/28/2014 1401   ALKPHOS 114 02/24/2021 0801   ALKPHOS 78 11/28/2014 1401   AST 39 02/24/2021 0801   AST 26 11/28/2014 1401   ALT 25 02/24/2021 0801   ALT 28 11/28/2014 1401   BILITOT 0.5 02/24/2021 0801   BILITOT 0.5 11/28/2014 1401       RADIOGRAPHIC STUDIES: I have personally reviewed the radiological images as listed and agreed with the findings in the report. No results found.   ASSESSMENT & PLAN:  Carcinoma of lower-outer quadrant of right breast in female, estrogen receptor positive ([place])  # Recurrent breast cancer-ER positive PR negative ? HER-2/neu-currently on  Faslodex plus abema. MARCH 2022-  No evidence of metastatic disease in the chest, abdomen or pelvis-stable  # Continue Faslodex; CONTINUE Abema 100 BID; Labs today- ANC- 1.1-; Hb 11.2; platlets-N- STABLE.  Will order scans at next visit.  Tumor markers improved/stable  #  Poorly controlled BG- 243- awaiting endocrinology evaluation in aiugust; with diet and medications.    #DISPOSITION:early AM appt # Faslodex/today  # Follow up in 4 weeks [[place]]-MD/ labs (CBC/CMP/Ca 27-29) Faslodex; -Dr.B    No orders of the defined types were placed in this encounter.  All questions were answered. The patient knows to call the clinic with any problems, questions or concerns.      [name], MD 02/25/2021 8:13 AM

## 2021-02-27 ENCOUNTER — Telehealth: Payer: Self-pay | Admitting: *Deleted

## 2021-02-27 DIAGNOSIS — C50511 Malignant neoplasm of lower-outer quadrant of right female breast: Secondary | ICD-10-CM

## 2021-02-27 MED ORDER — ABEMACICLIB 100 MG PO TABS: 100.0000 mg | ORAL_TABLET | Freq: Two times a day (BID) | ORAL | 0 refills | Status: DC

## 2021-02-27 NOTE — Telephone Encounter (Signed)
Kimberly [name] from [facility] needs a prescription for her Verzenio 100mg .  you can fax it to [phone], or call [phone].  It can also be e-scribed to the [facility].   Verzenio RF routed pt's preferred pharmacy

## 2021-03-02 ENCOUNTER — Encounter: Payer: Self-pay | Admitting: Internal Medicine

## 2021-03-15 IMAGING — US US ABDOMEN LIMITED
1 series · 14 of 25 positions shown · non-contrast
Comparison: None.

CLINICAL DATA: Vomiting with elevated LFTs

EXAM:
ULTRASOUND ABDOMEN LIMITED RIGHT UPPER QUADRANT

[Series 1: us abdomen limited · 14 of 41 slices shown]
[im 1/41]
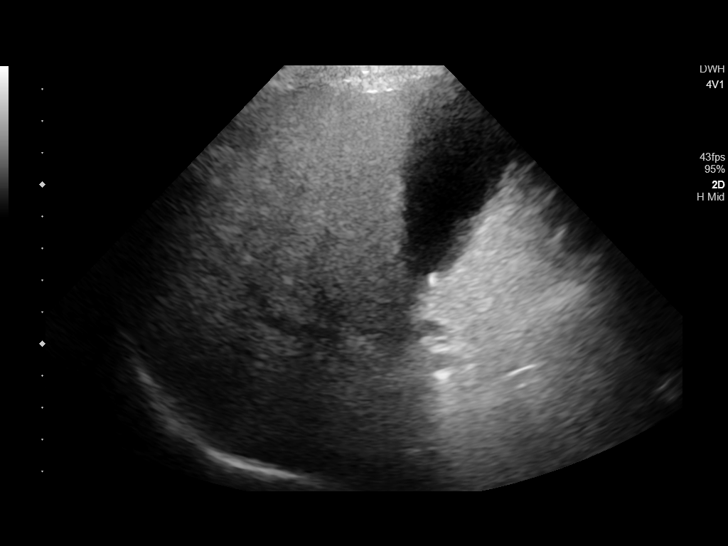
[im 4/41]
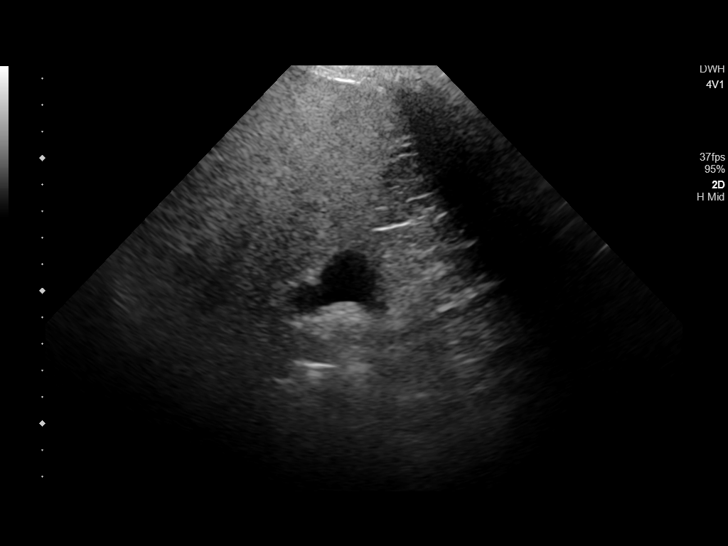
[im 7/41]
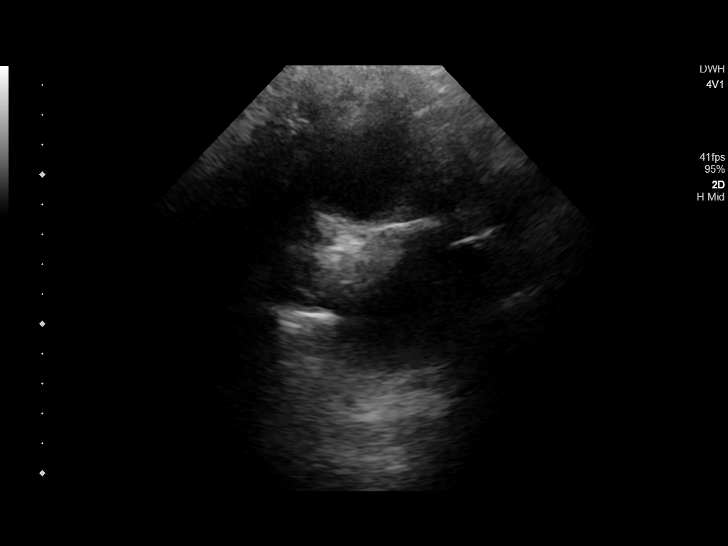
[im 11/41]
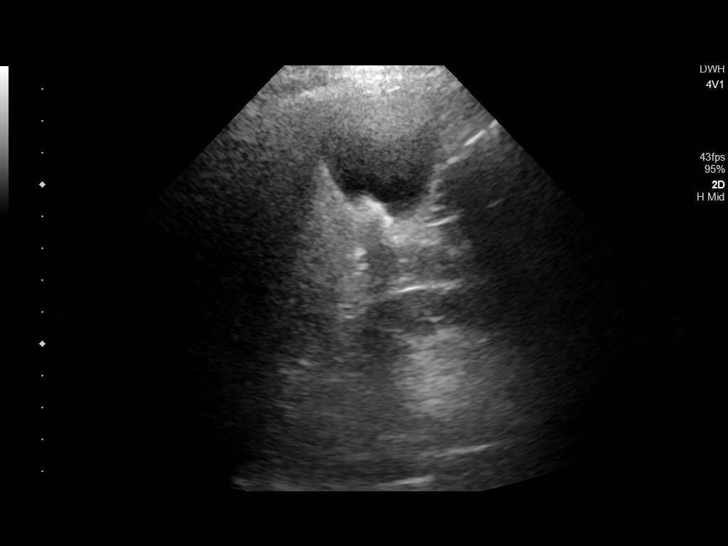
[im 14/41]
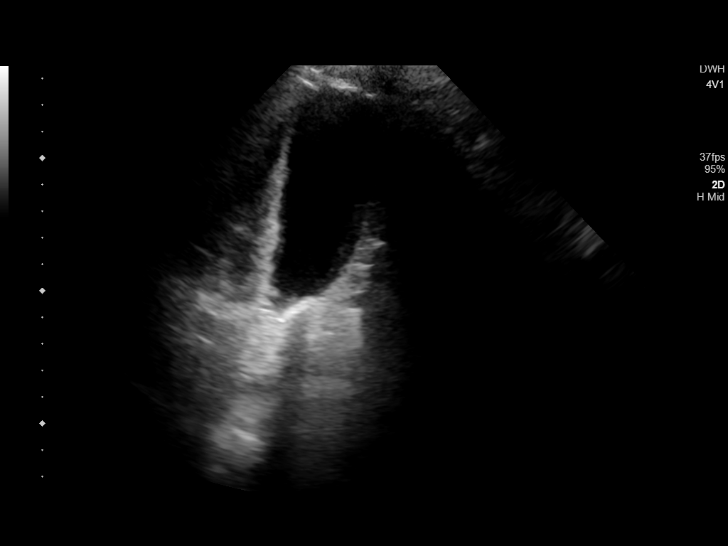
[im 16/41]
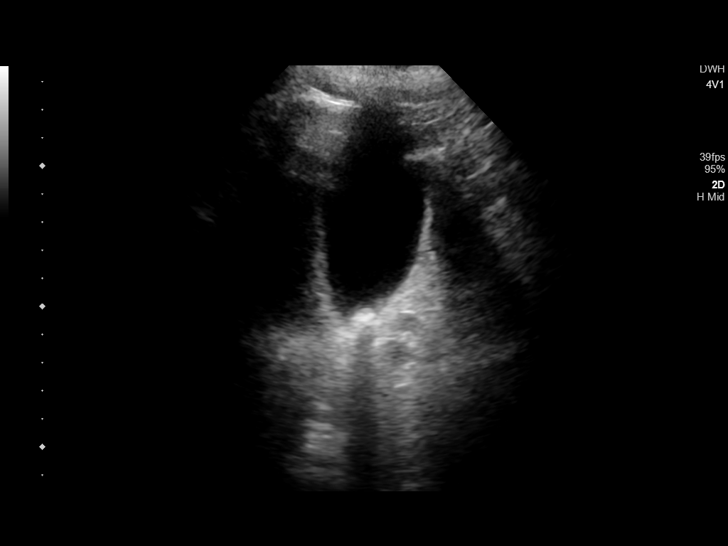
[im 19/41]
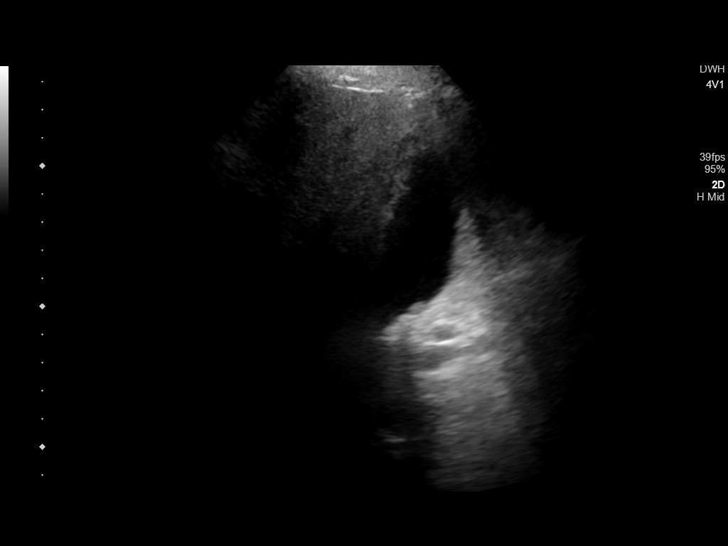
[im 22/41]
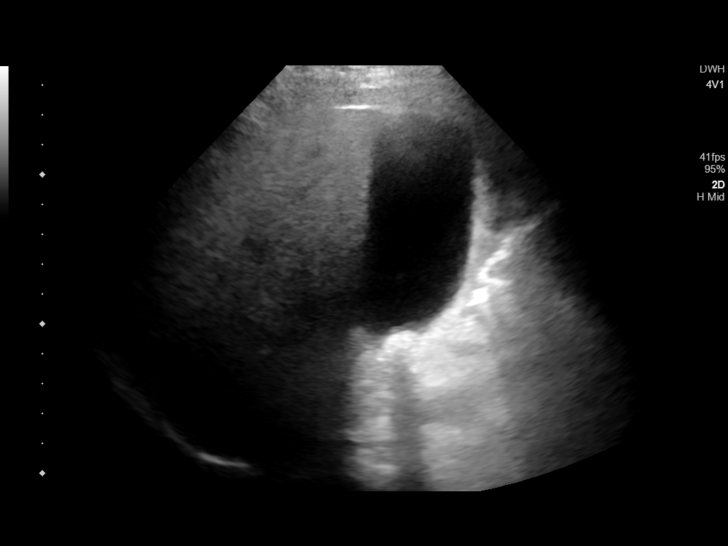
[im 26/41]
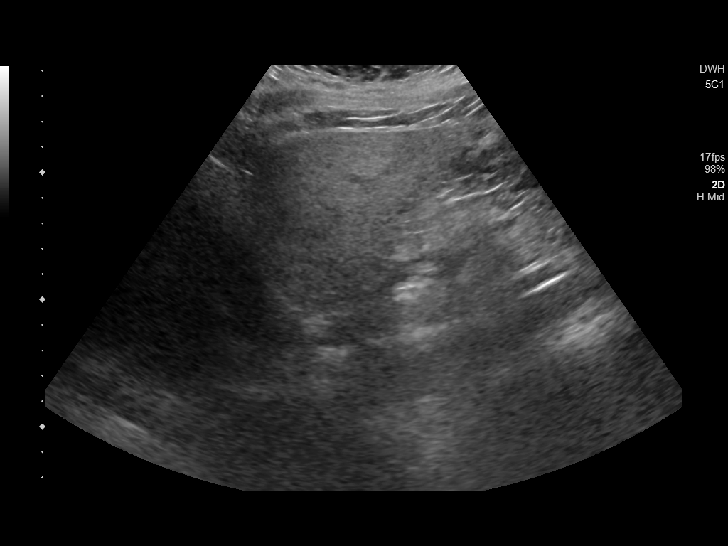
[im 27/41]
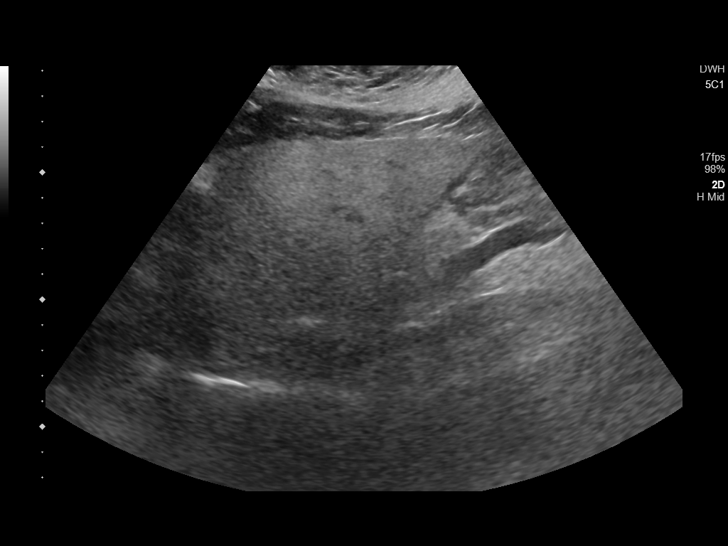
[im 31/41]
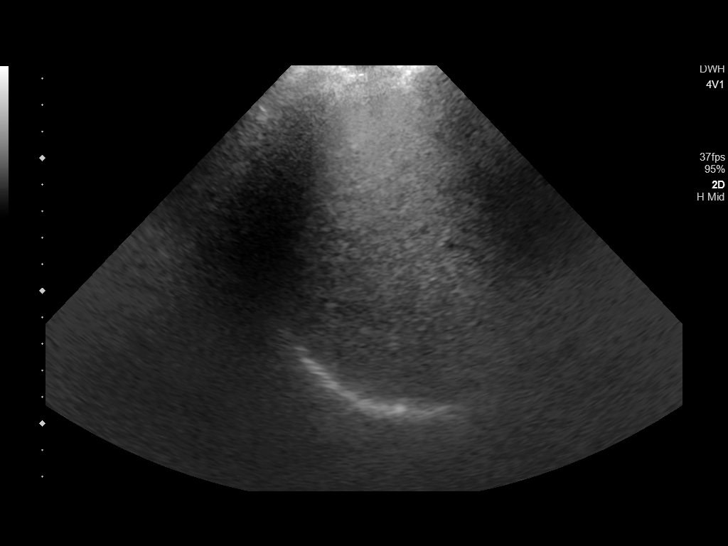
[im 34/41]
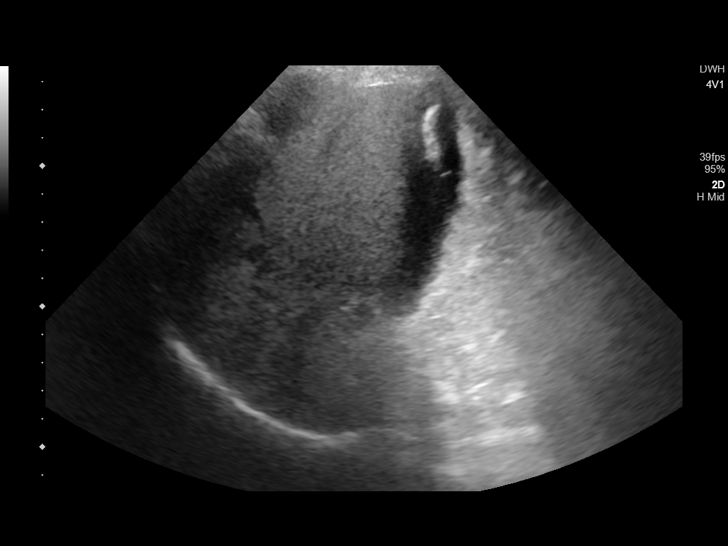
[im 37/41]
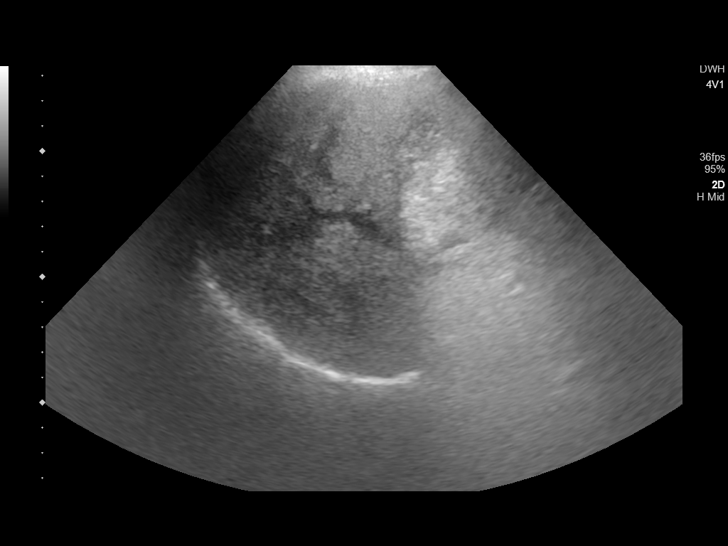
[im 41/41]
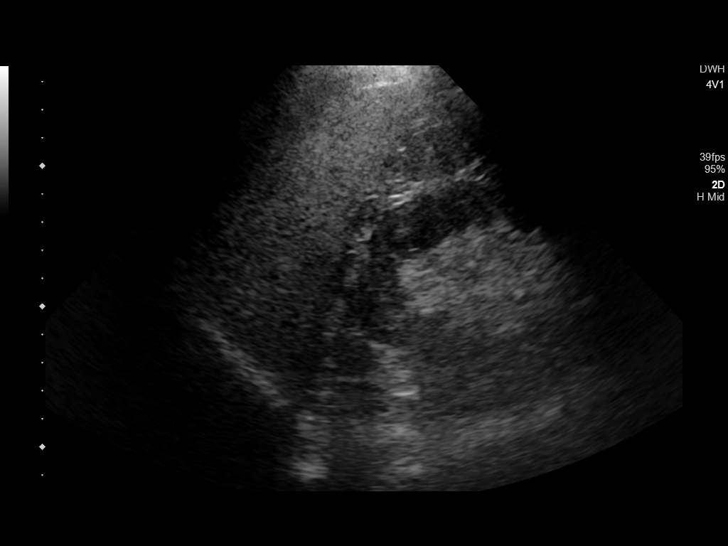

[14 of 25 positions shown; findings below may reference images not displayed]

FINDINGS: Gallbladder:

Cholelithiasis measuring up to 8 mm. A negative sonographic [name]
sign was reported by the sonographer. No pericholecystic fluid or
wall thickening.

Common bile duct:

Diameter: 6 mm

Liver:

Increased hepatic echogenicity without focal lesion. Portal vein is
patent on color Doppler imaging with normal direction of blood flow
towards the liver.

Other: None.
IMPRESSION: 1. Cholelithiasis without other evidence of acute cholecystitis.
2. Hepatic steatosis.

## 2021-03-19 ENCOUNTER — Encounter: Payer: Self-pay | Admitting: Internal Medicine

## 2021-03-19 ENCOUNTER — Other Ambulatory Visit: Payer: Self-pay | Admitting: *Deleted

## 2021-03-19 ENCOUNTER — Encounter: Payer: Self-pay | Admitting: *Deleted

## 2021-03-19 DIAGNOSIS — C50511 Malignant neoplasm of lower-outer quadrant of right female breast: Secondary | ICD-10-CM

## 2021-03-19 MED ORDER — ABEMACICLIB 100 MG PO TABS: 100.0000 mg | ORAL_TABLET | Freq: Two times a day (BID) | ORAL | 3 refills | Status: DC

## 2021-03-24 ENCOUNTER — Inpatient Hospital Stay (HOSPITAL_BASED_OUTPATIENT_CLINIC_OR_DEPARTMENT_OTHER): Payer: PPO | Admitting: Internal Medicine

## 2021-03-24 ENCOUNTER — Telehealth: Payer: Self-pay | Admitting: *Deleted

## 2021-03-24 ENCOUNTER — Other Ambulatory Visit: Payer: Self-pay

## 2021-03-24 ENCOUNTER — Inpatient Hospital Stay: Payer: PPO | Attending: Internal Medicine

## 2021-03-24 ENCOUNTER — Encounter: Payer: Self-pay | Admitting: Internal Medicine

## 2021-03-24 ENCOUNTER — Other Ambulatory Visit: Payer: Self-pay | Admitting: Internal Medicine

## 2021-03-24 ENCOUNTER — Ambulatory Visit: Payer: PPO

## 2021-03-24 DIAGNOSIS — Z5111 Encounter for antineoplastic chemotherapy: Secondary | ICD-10-CM | POA: Insufficient documentation

## 2021-03-24 DIAGNOSIS — C50511 Malignant neoplasm of lower-outer quadrant of right female breast: Secondary | ICD-10-CM

## 2021-03-24 DIAGNOSIS — Z803 Family history of malignant neoplasm of breast: Secondary | ICD-10-CM | POA: Insufficient documentation

## 2021-03-24 DIAGNOSIS — I10 Essential (primary) hypertension: Secondary | ICD-10-CM | POA: Diagnosis not present

## 2021-03-24 DIAGNOSIS — Z9013 Acquired absence of bilateral breasts and nipples: Secondary | ICD-10-CM | POA: Diagnosis not present

## 2021-03-24 DIAGNOSIS — Z833 Family history of diabetes mellitus: Secondary | ICD-10-CM | POA: Insufficient documentation

## 2021-03-24 DIAGNOSIS — Z170 Estrogen receptor positive status [ER+]: Secondary | ICD-10-CM | POA: Insufficient documentation

## 2021-03-24 DIAGNOSIS — R300 Dysuria: Secondary | ICD-10-CM

## 2021-03-24 DIAGNOSIS — C7800 Secondary malignant neoplasm of unspecified lung: Secondary | ICD-10-CM | POA: Insufficient documentation

## 2021-03-24 DIAGNOSIS — Z79899 Other long term (current) drug therapy: Secondary | ICD-10-CM | POA: Diagnosis not present

## 2021-03-24 DIAGNOSIS — E1165 Type 2 diabetes mellitus with hyperglycemia: Secondary | ICD-10-CM | POA: Insufficient documentation

## 2021-03-24 DIAGNOSIS — Z794 Long term (current) use of insulin: Secondary | ICD-10-CM | POA: Diagnosis not present

## 2021-03-24 LAB — COMPREHENSIVE METABOLIC PANEL
ALT: 25 U/L (ref 0–44)
AST: 36 U/L (ref 15–41)
Albumin: 3.5 g/dL (ref 3.5–5.0)
Alkaline Phosphatase: 97 U/L (ref 38–126)
Anion gap: 7 (ref 5–15)
BUN: 14 mg/dL (ref 8–23)
CO2: 25 mmol/L (ref 22–32)
Calcium: 9.3 mg/dL (ref 8.9–10.3)
Chloride: 103 mmol/L (ref 98–111)
Creatinine, Ser: 1.04 mg/dL — ABNORMAL HIGH (ref 0.44–1.00)
GFR, Estimated: 59 mL/min — ABNORMAL LOW (ref >60)
Glucose, Bld: 222 mg/dL — ABNORMAL HIGH (ref 70–99)
Potassium: 4.3 mmol/L (ref 3.5–5.1)
Sodium: 135 mmol/L (ref 135–145)
Total Bilirubin: 0.5 mg/dL (ref 0.3–1.2)
Total Protein: 7.3 g/dL (ref 6.5–8.1)

## 2021-03-24 LAB — URINALYSIS, COMPLETE (UACMP) WITH MICROSCOPIC
Bilirubin Urine: NEGATIVE
Glucose, UA: 500 mg/dL — AB
Hgb urine dipstick: NEGATIVE
Ketones, ur: NEGATIVE mg/dL
Nitrite: NEGATIVE
Protein, ur: NEGATIVE mg/dL
Specific Gravity, Urine: 1.025 (ref 1.005–1.030)
pH: 5 (ref 5.0–8.0)

## 2021-03-24 LAB — CBC WITH DIFFERENTIAL/PLATELET
Abs Immature Granulocytes: 0.01 10*3/uL (ref 0.00–0.07)
Basophils Absolute: 0.1 10*3/uL (ref 0.0–0.1)
Basophils Relative: 2 %
Eosinophils Absolute: 0.1 10*3/uL (ref 0.0–0.5)
Eosinophils Relative: 3 %
HCT: 34.6 % — ABNORMAL LOW (ref 36.0–46.0)
Hemoglobin: 12 g/dL (ref 12.0–15.0)
Immature Granulocytes: 0 %
Lymphocytes Relative: 48 %
Lymphs Abs: 1.7 10*3/uL (ref 0.7–4.0)
MCH: 34 pg (ref 26.0–34.0)
MCHC: 34.7 g/dL (ref 30.0–36.0)
MCV: 98 fL (ref 80.0–100.0)
Monocytes Absolute: 0.2 10*3/uL (ref 0.1–1.0)
Monocytes Relative: 6 %
Neutro Abs: 1.4 10*3/uL — ABNORMAL LOW (ref 1.7–7.7)
Neutrophils Relative %: 41 %
Platelets: 218 10*3/uL (ref 150–400)
RBC Morphology: NONE SEEN
RBC: 3.53 MIL/uL — ABNORMAL LOW (ref 3.87–5.11)
RDW: 14 % (ref 11.5–15.5)
WBC Morphology: ABNORMAL
WBC: 3.4 10*3/uL — ABNORMAL LOW (ref 4.0–10.5)
nRBC: 0 % (ref 0.0–0.2)

## 2021-03-24 MED ORDER — FULVESTRANT 250 MG/5ML IM SOLN
500.0000 mg | Freq: Once | INTRAMUSCULAR | Status: AC
  Administered 2021-03-24: 500 mg via INTRAMUSCULAR

## 2021-03-24 MED ORDER — CIPROFLOXACIN HCL 500 MG PO TABS: 500.0000 mg | ORAL_TABLET | Freq: Two times a day (BID) | ORAL | 0 refills | Status: DC

## 2021-03-24 NOTE — Progress Notes (Signed)
pt states since last Thursday she has not been feeling well believes she has a UTI due to lower back pain, frequent urination but not much, bloated, feels uncomfortable. Will like to know if she can provide a specimen.

## 2021-03-24 NOTE — Telephone Encounter (Signed)
Patient called asking for results of urine test  Urinalysis, Complete w Microscopic Order: [id number] Status: Final result   Visible to patient: Yes (seen)   Next appt: 04/21/2021 at 08:45 AM in Oncology ([facility])   Dx: Dysuria   0 Result Notes  Component Ref Range & Units 11:43  (03/24/21) 9 mo ago  (05/30/20) 1 yr ago  (01/03/20) 1 yr ago  (09/06/19) 3 yr ago  (04/25/17) 3 yr ago  (04/12/17) 8 yr ago  (08/14/12)  Color, Urine YELLOW YELLOW  YELLOW Abnormal   YELLOW Abnormal   YELLOW Abnormal   YELLOW Abnormal   YELLOW    APPearance CLEAR CLOUDY Abnormal   HAZY Abnormal   TURBID Abnormal   HAZY Abnormal   CLEAR Abnormal   CLOUDY Abnormal     Specific Gravity, Urine 1.005 - 1.030 1.025  1.029  1.023  1.038 High   1.021  >1.030 High     pH 5.0 - 8.0 5.0  5.0  5.0  6.0  5.0  5.5  6.0 R   Glucose, UA NEGATIVE mg/dL 500 Abnormal   >=500 Abnormal   NEGATIVE  >=500 Abnormal   NEGATIVE  250 Abnormal     Hgb urine dipstick NEGATIVE NEGATIVE  SMALL Abnormal   MODERATE Abnormal   NEGATIVE  NEGATIVE  NEGATIVE    Bilirubin Urine NEGATIVE NEGATIVE  NEGATIVE  NEGATIVE  NEGATIVE  NEGATIVE  NEGATIVE    Ketones, ur NEGATIVE mg/dL NEGATIVE  NEGATIVE  NEGATIVE  20 Abnormal   NEGATIVE  NEGATIVE    Protein, ur NEGATIVE mg/dL NEGATIVE  NEGATIVE  >=300 Abnormal   NEGATIVE  NEGATIVE  TRACE Abnormal   NEGATIVE R   Nitrite NEGATIVE NEGATIVE  NEGATIVE  NEGATIVE  NEGATIVE  NEGATIVE  NEGATIVE  NEGATIVE   Leukocytes,Ua NEGATIVE SMALL Abnormal   MODERATE Abnormal   LARGE Abnormal   NEGATIVE      Squamous Epithelial / LPF 0 - 5 0-5  0-5  0-5  0-5  0-5 Abnormal  R  0-5 Abnormal  R    WBC, UA 0 - 5 WBC/hpf 21-50  21-50  >50 High   6-10  0-5  TOO NUMEROUS TO COUNT    RBC / HPF 0 - 5 RBC/hpf 0-5  0-5  >50 High    0-5  0-5    Bacteria, UA NONE SEEN MANY Abnormal   NONE SEEN  MANY Abnormal   NONE SEEN  NONE SEEN  FEW Abnormal     Comment: Performed at [facility], [address] [place], [place] [address]   Resulting Agency  [facility] [place] [facility] [place] [facility] [place] [facility] [place] [facility] [place] [facility] [place] [facility] CONVERSION         Specimen Collected: 03/24/21 11:43 Last Resulted: 03/24/21 12:11

## 2021-03-24 NOTE — Assessment & Plan Note (Addendum)
#  Recurrent breast cancer-ER positive PR negative ? HER-2/neu-currently on Faslodex plus abema. MARCH 2022-  No evidence of metastatic disease in the chest, abdomen or pelvis-stable  # Continue Faslodex; HOLD Abema 100 BID x2 weeks [see below]; Labs today- ANC- 1.4-; Hb 11.2; platlets-N- STABLE.  Will order scans at next visit.    # Poorly controlled BG- 222- awaiting endocrinology evaluation in aiugust; with diet and medications.   # recurrent UTI/ ? Suprapubic tenderness- check UA/culture- [HOLD abema x2 weeks]; after neutrophil count 1.4.  Patient is factors for repeated UTIs include poorly controlled diabetes/neutropenia from CDK inhibitor.   #DISPOSITION:early AM appt # UA/urine culture today # Faslodex/today  # Follow up in 4 weeks [[place]]-MD/ labs (CBC/CMP/Ca 27-29) Faslodex; -Dr.B

## 2021-03-24 NOTE — Telephone Encounter (Signed)
Spoke with patient. Dr. Rogue Bussing sent script for Cipro 500 mg x 7 days. Pt aware.

## 2021-03-24 NOTE — Progress Notes (Signed)
Ciprofloxacin-called in to pharmacy.  GB

## 2021-03-24 NOTE — Progress Notes (Signed)
.[name] OFFICE PROGRESS NOTE  Patient Care Team: Kimberly [name] (Inactive) as PCP - General (Internal Medicine) Kimberly [name] (General Surgery) Kimberly [name] (Obstetrics and Gynecology) Kimberly [name] Kimberly [name] (Inactive) (Unknown Physician Specialty) Kimberly [name] as Consulting Physician (Hematology and Oncology)  Cancer Staging Carcinoma of lower-outer quadrant of right breast in female, estrogen receptor positive [facility]) Staging form: Breast, AJCC 7th Edition - Pathologic: ypT1c,ypN2a, MX - Signed by Kimberly Lasso, [name] on 03/10/2012 Cancer stage: ypT1c,ypN2a, MX    Oncology History Overview Note  # DEC 2012- RIGHT BREAST CA T2 N2 M0 tumor from biopsy.  Estrogen receptor positive, Progesterone receptor positive.  Current receptor negative by FISH 2. Neoadjuvant chemotherapy started in December of 28 with Cytoxan Adriamycin 3. Started on Taxol weekly chemotherapy. 4. Patient finished 12  cycles of Taxol chemotherapy in May of 2013.     5. Status post right modified radical mastectomy [Kimberly [name]; [place]] June of 2013, ypT1c  yp N2  MO. started also on Lerazole    7. Radiation therapy to the right breast (September of 2013).  Lymph node was positive for HER-2 receptor gene amplification of 2.22.  Will proceed with Herceptin treatment starting in September of 2013.   8.Patient has finished Herceptin (maintenance therapy) in August of 2014 8. Start patient on letrozole from November, 2013. 9. Patient started on Herceptin in September 2013.    10Patient finished 12 months of Herceptin therapy on August, 2014  # 6. Status post left side prophylactic mastectomy.  #Late MAY 2018-RECURRENCE BREAST CA- ER positive/PR negative; ?? HER-2/neu- [biopsy- proven-mediastinal lymph node; in suff for her 2 testing].  [elevated Tumor marker- CT/PET- uptake in Right Mediastinal LN; Sternum [June 2018 EBUS- Kimberly [name]]   # March 10 2017-  faslodex + Abema; OCT 5th CT-PR of mediastinal LN [consent]  #August 2020-left chest wall nodule biopsy benign  #August 2019- DVT-right upper extremity; Eliquis; stop end of March 2021 [repeat ultrasound negative/patient preference]; # JAN 2022-Cirrhosis- ? On CT scan-FEB 2022MRI liver- Negative fro overt cirrhosis --------------------------------------------------------------- -    DIAGNOSIS: [ BREAST CANCER- ER/PR/HER2 NEU POS  STAGE:  4   ;GOALS: PALLIATIVE  CURRENT/MOST RECENT THERAPY - ABEMA + FASLODEX    Carcinoma of lower-outer quadrant of right breast in female, estrogen receptor positive ([place])   INTERVAL HISTORY:  Kimberly [name] 65 y.o.  female pleasant patient above history of metastatic ER PR positive HER-2/neu positive breast cancer currently on abema+ Faslodex is here for follow-up.  Patient complains of bladder infection-like symptoms "suprapubic discomfort"; urgency/Frequency.  No dysuria.  No foul-smelling urine.  No fevers or back pain.  Patient was last treated with ciprofloxacin approximately 2 months ago.  She feels her symptoms have "returned".  She otherwise denies any nausea vomiting diarrhea.  No headaches  Review of Systems  Constitutional:  Positive for malaise/fatigue. Negative for chills, diaphoresis, fever and weight loss.  HENT:  Negative for nosebleeds and sore throat.   Eyes:  Negative for double vision.  Respiratory:  Negative for cough, hemoptysis, sputum production, shortness of breath and wheezing.   Cardiovascular:  Negative for chest pain, palpitations, orthopnea and leg swelling.  Gastrointestinal:  Negative for abdominal pain, blood in stool, constipation, heartburn, melena, nausea and vomiting.  Musculoskeletal:  Positive for back pain and joint pain.  Neurological:  Negative for dizziness, tingling, focal weakness, weakness and headaches.  Endo/Heme/Allergies:  Does not bruise/bleed easily.  Psychiatric/Behavioral:  Negative for  depression. The  patient is not nervous/anxious and does not have insomnia.      PAST MEDICAL HISTORY :  Past Medical History:  Diagnosis Date   Arthritis    Cancer of lower-outer quadrant of female breast ([place]) 08/06/2011   RIGHT, chemo and bilateral mastectomies.   High cholesterol    History of kidney stones    Hx of bilateral breast implants    Hypertension    Lung metastases ([place]) 20118   chemo pills and faslidex shots.   PONV (postoperative nausea and vomiting)    Type II diabetes mellitus ([place])    fasting 140-150   Vertigo    LAST WEEK   Vertigo 01/2017    PAST SURGICAL HISTORY :   Past Surgical History:  Procedure Laterality Date   BREAST BIOPSY  07/2011, 2020   right   BREAST RECONSTRUCTION  03/07/2012   Procedure: BREAST RECONSTRUCTION;  Surgeon: Kimberly Reese, [name];  Location: [place];  Service: Plastics;  Laterality: Left;  BREAST RECONSTRUCTION WITH PLACEMENT OF TISSUE EXPANDER TO LEFT BREAST   BREAST RECONSTRUCTION  02/06/2013   CESAREAN SECTION  [id number]   ENDOBRONCHIAL ULTRASOUND N/A 02/24/2017   Procedure: ENDOBRONCHIAL ULTRASOUND;  Surgeon: Kimberly Lipps, [name];  Location: [facility];  Service: Cardiopulmonary;  Laterality: N/A;   LATISSIMUS FLAP TO BREAST Right 02/06/2013   Procedure: LATISSIMUS FLAP TO RIGHT BREAST WITH IMPLANT;  Surgeon: Kimberly Reese, [name];  Location: [place];  Service: Plastics;  Laterality: Right;   MASTECTOMY  03/07/12   modified right; total left   MODIFIED MASTECTOMY  03/07/2012   Procedure: MODIFIED MASTECTOMY;  Surgeon: Kimberly Lasso, [name];  Location: [place];  Service: General;  Laterality: Right;   PORTACATH PLACEMENT  07/2011   SCAR REVISION  03/30/2012   Procedure: SCAR REVISION;  Surgeon: Kimberly Lasso, [name];  Location: [place];  Service: General;  Laterality: Right;  CLOSURE OF MASTECTOMY INCISION   WISDOM TOOTH EXTRACTION      FAMILY HISTORY :   Family History  Problem Relation Age of Onset   Breast cancer Mother     Diabetes Father     SOCIAL HISTORY:   Social History   Tobacco Use   Smoking status: Never   Smokeless tobacco: Never  Vaping Use   Vaping Use: Never used  Substance Use Topics   Alcohol use: No   Drug use: No    ALLERGIES:  is allergic to sulfa antibiotics.  MEDICATIONS:  Current Outpatient Medications  Medication Sig Dispense Refill   abemaciclib (VERZENIO) 100 MG tablet Take 1 tablet (100 mg total) by mouth 2 (two) times daily. Swallow tablets whole. Do not chew, crush, or split tablets before swallowing. 56 tablet 3   atorvastatin (LIPITOR) 20 MG tablet Take 1 tablet (20 mg total) by mouth daily. 90 tablet 3   betamethasone valerate (VALISONE) 0.1 % cream Apply 0.1 application topically as needed.     clotrimazole-betamethasone (LOTRISONE) cream SMARTSIG:Topical Morning-Evening     fulvestrant (FASLODEX) 250 MG/5ML injection Inject into the muscle every 30 (thirty) days. One injection each buttock over 1-2 minutes. Warm prior to use.     glipiZIDE (GLUCOTROL XL) 10 MG 24 hr tablet Take 1 tablet (10 mg total) by mouth 2 (two) times daily. 180 tablet 3   insulin aspart (NOVOLOG FLEXPEN) 100 UNIT/ML FlexPen Inject 4 Units into the skin 3 (three) times daily with meals. 15 mL 0   Insulin Detemir (LEVEMIR) 100 UNIT/ML Pen Inject 20 Units into the skin at  bedtime. 15 mL 0   levothyroxine (SYNTHROID) 25 MCG tablet Take 1 tablet (25 mcg total) by mouth daily before breakfast. 90 tablet 1   lisinopril (ZESTRIL) 10 MG tablet Take 10 mg by mouth daily.     mupirocin cream (BACTROBAN) 2 % Apply 1 application topically 2 (two) times daily. 15 g 0   mupirocin ointment (BACTROBAN) 2 %      loperamide (IMODIUM A-D) 2 MG tablet Take 2 mg by mouth 4 (four) times daily as needed for diarrhea or loose stools. (Patient not taking: Reported on 03/24/2021)     nitrofurantoin, macrocrystal-monohydrate, (MACROBID) 100 MG capsule Take 100 mg by mouth 2 (two) times daily. (Patient not taking: Reported on  03/24/2021)     ondansetron (ZOFRAN) 8 MG tablet Take 1 tablet (8 mg total) by mouth every 8 (eight) hours as needed for nausea or vomiting. (Patient not taking: Reported on 03/24/2021) 20 tablet 3   ondansetron (ZOFRAN-ODT) 4 MG disintegrating tablet Take 1 tablet (4 mg total) by mouth every 8 (eight) hours as needed for nausea or vomiting. (Patient not taking: Reported on 03/24/2021) 20 tablet 3   prochlorperazine (COMPAZINE) 10 MG tablet Take 1 tablet (10 mg total) by mouth every 6 (six) hours as needed for nausea or vomiting. (Patient not taking: Reported on 03/24/2021) 30 tablet 0   promethazine (PHENERGAN) 25 MG tablet Take 1 tablet (25 mg total) by mouth every 6 (six) hours as needed for refractory nausea / vomiting. (Patient not taking: Reported on 03/24/2021) 30 tablet 0   No current facility-administered medications for this visit.    PHYSICAL EXAMINATION: ECOG PERFORMANCE STATUS: 1 - Symptomatic but completely ambulatory  BP 131/77   Pulse 95   Temp 97.6 F (36.4 C)   Resp 16   Wt 172 lb 2.9 oz (78.1 kg)   SpO2 99%   BMI 31.49 kg/m   Filed Weights   03/24/21 1043  Weight: 172 lb 2.9 oz (78.1 kg)    Physical Exam Constitutional:      Comments: She is alone.   HENT:     Head: Normocephalic and atraumatic.     Mouth/Throat:     Pharynx: No oropharyngeal exudate.  Eyes:     Pupils: Pupils are equal, round, and reactive to light.  Cardiovascular:     Rate and Rhythm: Normal rate and regular rhythm.  Pulmonary:     Effort: Pulmonary effort is normal. No respiratory distress.     Breath sounds: Normal breath sounds. No wheezing.  Abdominal:     General: Bowel sounds are normal. There is no distension.     Palpations: Abdomen is soft. There is no mass.     Tenderness: There is no abdominal tenderness. There is no guarding or rebound.  Musculoskeletal:        General: No tenderness. Normal range of motion.     Cervical back: Normal range of motion and neck supple.      Comments: Approximately 1 cm hard nodule felt in the anterior chest wall/left parasternal.  Skin:    General: Skin is warm.     Comments: Maculopapular rash in the left inguinal region/fold.  Neurological:     Mental Status: She is alert and oriented to person, place, and time.  Psychiatric:        Mood and Affect: Affect normal.    LABORATORY DATA:  I have reviewed the data as listed    Component Value Date/Time   NA 135 03/24/2021 1024  NA 135 11/28/2014 1401   K 4.3 03/24/2021 1024   K 4.7 11/28/2014 1401   CL 103 03/24/2021 1024   CL 99 (L) 11/28/2014 1401   CO2 25 03/24/2021 1024   CO2 28 11/28/2014 1401   GLUCOSE 222 (H) 03/24/2021 1024   GLUCOSE 215 (H) 11/28/2014 1401   BUN 14 03/24/2021 1024   BUN 16 11/28/2014 1401   CREATININE 1.04 (H) 03/24/2021 1024   CREATININE 0.96 11/28/2014 1401   CALCIUM 9.3 03/24/2021 1024   CALCIUM 9.9 11/28/2014 1401   PROT 7.3 03/24/2021 1024   PROT 7.6 11/28/2014 1401   ALBUMIN 3.5 03/24/2021 1024   ALBUMIN 4.5 11/28/2014 1401   AST 36 03/24/2021 1024   AST 26 11/28/2014 1401   ALT 25 03/24/2021 1024   ALT 28 11/28/2014 1401   ALKPHOS 97 03/24/2021 1024   ALKPHOS 78 11/28/2014 1401   BILITOT 0.5 03/24/2021 1024   BILITOT 0.5 11/28/2014 1401   GFRNONAA 59 (L) 03/24/2021 1024   GFRNONAA >60 11/28/2014 1401   GFRAA >60 05/30/2020 1022   GFRAA >60 11/28/2014 1401    No results found for: SPEP, UPEP  Lab Results  Component Value Date   WBC 3.4 (L) 03/24/2021   NEUTROABS 1.4 (L) 03/24/2021   HGB 12.0 03/24/2021   HCT 34.6 (L) 03/24/2021   MCV 98.0 03/24/2021   PLT 218 03/24/2021      Chemistry      Component Value Date/Time   NA 135 03/24/2021 1024   NA 135 11/28/2014 1401   K 4.3 03/24/2021 1024   K 4.7 11/28/2014 1401   CL 103 03/24/2021 1024   CL 99 (L) 11/28/2014 1401   CO2 25 03/24/2021 1024   CO2 28 11/28/2014 1401   BUN 14 03/24/2021 1024   BUN 16 11/28/2014 1401   CREATININE 1.04 (H) 03/24/2021 1024    CREATININE 0.96 11/28/2014 1401      Component Value Date/Time   CALCIUM 9.3 03/24/2021 1024   CALCIUM 9.9 11/28/2014 1401   ALKPHOS 97 03/24/2021 1024   ALKPHOS 78 11/28/2014 1401   AST 36 03/24/2021 1024   AST 26 11/28/2014 1401   ALT 25 03/24/2021 1024   ALT 28 11/28/2014 1401   BILITOT 0.5 03/24/2021 1024   BILITOT 0.5 11/28/2014 1401       RADIOGRAPHIC STUDIES: I have personally reviewed the radiological images as listed and agreed with the findings in the report. No results found.   ASSESSMENT & PLAN:  Carcinoma of lower-outer quadrant of right breast in female, estrogen receptor positive ([place])  # Recurrent breast cancer-ER positive PR negative ? HER-2/neu-currently on Faslodex plus abema. MARCH 2022-  No evidence of metastatic disease in the chest, abdomen or pelvis-stable  # Continue Faslodex; HOLD Abema 100 BID x2 weeks [see below]; Labs today- ANC- 1.4-; Hb 11.2; platlets-N- STABLE.  Will order scans at next visit.    # Poorly controlled BG- 222- awaiting endocrinology evaluation in aiugust; with diet and medications.   # recurrent UTI/ ? Suprapubic tenderness- check UA/culture- [HOLD abema x2 weeks]; after neutrophil count 1.4.  Patient is factors for repeated UTIs include poorly controlled diabetes/neutropenia from CDK inhibitor.   #DISPOSITION:early AM appt # UA/urine culture today # Faslodex/today  # Follow up in 4 weeks [[place]]-[name]/ labs (CBC/CMP/Ca 27-29) Faslodex; -Dr.B     No orders of the defined types were placed in this encounter.  All questions were answered. The patient knows to call the clinic with any problems,  questions or concerns.      Kimberly [name] 03/24/2021 1:13 PM

## 2021-03-25 LAB — CANCER ANTIGEN 27.29: CA 27.29: 25.5 U/mL (ref 0.0–38.6)

## 2021-03-28 LAB — URINE CULTURE: Culture: 50000 — AB

## 2021-03-30 ENCOUNTER — Encounter: Payer: Self-pay | Admitting: Internal Medicine

## 2021-03-30 NOTE — Addendum Note (Signed)
Addended by: Adelina Mings on: 03/30/2021 03:49 PM   Modules accepted: Orders

## 2021-04-20 ENCOUNTER — Other Ambulatory Visit: Payer: Self-pay

## 2021-04-20 DIAGNOSIS — Z170 Estrogen receptor positive status [ER+]: Secondary | ICD-10-CM

## 2021-04-21 ENCOUNTER — Inpatient Hospital Stay (HOSPITAL_BASED_OUTPATIENT_CLINIC_OR_DEPARTMENT_OTHER): Payer: PPO | Admitting: Internal Medicine

## 2021-04-21 ENCOUNTER — Other Ambulatory Visit: Payer: Self-pay

## 2021-04-21 ENCOUNTER — Inpatient Hospital Stay: Payer: PPO

## 2021-04-21 ENCOUNTER — Encounter: Payer: Self-pay | Admitting: Internal Medicine

## 2021-04-21 ENCOUNTER — Inpatient Hospital Stay: Payer: PPO | Attending: Internal Medicine | Admitting: Internal Medicine

## 2021-04-21 VITALS — BP 107/61 | HR 86 | Temp 97.5°F | Resp 16 | Wt 172.0 lb

## 2021-04-21 DIAGNOSIS — R300 Dysuria: Secondary | ICD-10-CM

## 2021-04-21 DIAGNOSIS — Z170 Estrogen receptor positive status [ER+]: Secondary | ICD-10-CM

## 2021-04-21 DIAGNOSIS — C50511 Malignant neoplasm of lower-outer quadrant of right female breast: Secondary | ICD-10-CM | POA: Diagnosis not present

## 2021-04-21 DIAGNOSIS — Z5111 Encounter for antineoplastic chemotherapy: Secondary | ICD-10-CM | POA: Insufficient documentation

## 2021-04-21 DIAGNOSIS — Z803 Family history of malignant neoplasm of breast: Secondary | ICD-10-CM | POA: Diagnosis not present

## 2021-04-21 DIAGNOSIS — Z79899 Other long term (current) drug therapy: Secondary | ICD-10-CM | POA: Insufficient documentation

## 2021-04-21 DIAGNOSIS — I10 Essential (primary) hypertension: Secondary | ICD-10-CM | POA: Insufficient documentation

## 2021-04-21 DIAGNOSIS — N390 Urinary tract infection, site not specified: Secondary | ICD-10-CM

## 2021-04-21 DIAGNOSIS — Z9013 Acquired absence of bilateral breasts and nipples: Secondary | ICD-10-CM | POA: Diagnosis not present

## 2021-04-21 DIAGNOSIS — Z87440 Personal history of urinary (tract) infections: Secondary | ICD-10-CM | POA: Insufficient documentation

## 2021-04-21 DIAGNOSIS — C7800 Secondary malignant neoplasm of unspecified lung: Secondary | ICD-10-CM | POA: Insufficient documentation

## 2021-04-21 DIAGNOSIS — E119 Type 2 diabetes mellitus without complications: Secondary | ICD-10-CM | POA: Diagnosis not present

## 2021-04-21 DIAGNOSIS — Z794 Long term (current) use of insulin: Secondary | ICD-10-CM | POA: Insufficient documentation

## 2021-04-21 LAB — URINALYSIS, COMPLETE (UACMP) WITH MICROSCOPIC
Bilirubin Urine: NEGATIVE
Glucose, UA: >1000 mg/dL — AB
Hgb urine dipstick: NEGATIVE
Ketones, ur: NEGATIVE mg/dL
Leukocytes,Ua: NEGATIVE
Nitrite: NEGATIVE
Protein, ur: NEGATIVE mg/dL
Specific Gravity, Urine: 1.02 (ref 1.005–1.030)
WBC, UA: >50 WBC/hpf (ref 0–5)
pH: 5 (ref 5.0–8.0)

## 2021-04-21 LAB — CBC WITH DIFFERENTIAL/PLATELET
Abs Immature Granulocytes: 0.01 10*3/uL (ref 0.00–0.07)
Basophils Absolute: 0 10*3/uL (ref 0.0–0.1)
Basophils Relative: 1 %
Eosinophils Absolute: 0 10*3/uL (ref 0.0–0.5)
Eosinophils Relative: 1 %
HCT: 31.8 % — ABNORMAL LOW (ref 36.0–46.0)
Hemoglobin: 11.1 g/dL — ABNORMAL LOW (ref 12.0–15.0)
Immature Granulocytes: 0 %
Lymphocytes Relative: 53 %
Lymphs Abs: 1.5 10*3/uL (ref 0.7–4.0)
MCH: 34 pg (ref 26.0–34.0)
MCHC: 34.9 g/dL (ref 30.0–36.0)
MCV: 97.5 fL (ref 80.0–100.0)
Monocytes Absolute: 0.2 10*3/uL (ref 0.1–1.0)
Monocytes Relative: 6 %
Neutro Abs: 1.1 10*3/uL — ABNORMAL LOW (ref 1.7–7.7)
Neutrophils Relative %: 39 %
Platelets: 188 10*3/uL (ref 150–400)
RBC: 3.26 MIL/uL — ABNORMAL LOW (ref 3.87–5.11)
RDW: 13.3 % (ref 11.5–15.5)
WBC: 2.9 10*3/uL — ABNORMAL LOW (ref 4.0–10.5)
nRBC: 0 % (ref 0.0–0.2)

## 2021-04-21 LAB — COMPREHENSIVE METABOLIC PANEL
ALT: 25 U/L (ref 0–44)
AST: 34 U/L (ref 15–41)
Albumin: 3.2 g/dL — ABNORMAL LOW (ref 3.5–5.0)
Alkaline Phosphatase: 93 U/L (ref 38–126)
Anion gap: 7 (ref 5–15)
BUN: 17 mg/dL (ref 8–23)
CO2: 26 mmol/L (ref 22–32)
Calcium: 9.4 mg/dL (ref 8.9–10.3)
Chloride: 100 mmol/L (ref 98–111)
Creatinine, Ser: 1.09 mg/dL — ABNORMAL HIGH (ref 0.44–1.00)
GFR, Estimated: 56 mL/min — ABNORMAL LOW (ref >60)
Glucose, Bld: 299 mg/dL — ABNORMAL HIGH (ref 70–99)
Potassium: 4.1 mmol/L (ref 3.5–5.1)
Sodium: 133 mmol/L — ABNORMAL LOW (ref 135–145)
Total Bilirubin: 0.4 mg/dL (ref 0.3–1.2)
Total Protein: 6.8 g/dL (ref 6.5–8.1)

## 2021-04-21 MED ORDER — FULVESTRANT 250 MG/5ML IM SOLN
500.0000 mg | Freq: Once | INTRAMUSCULAR | Status: AC
  Administered 2021-04-21: 500 mg via INTRAMUSCULAR

## 2021-04-21 NOTE — Progress Notes (Signed)
.[place] OFFICE PROGRESS NOTE  Patient Care Team: Ezequiel Kayser, MD (Inactive) as PCP - General (Internal Medicine) Neldon Mc, MD (General Surgery) Everlene Farrier, MD (Obstetrics and Gynecology) Noreene Filbert, MD Forest Gleason, MD (Inactive) (Unknown Physician Specialty) [name], MD as Consulting Physician (Hematology and Oncology)  Cancer Staging Carcinoma of lower-outer quadrant of right breast in female, estrogen receptor positive [facility]) Staging form: Breast, AJCC 7th Edition - Pathologic: ypT1c,ypN2a, MX - Signed by Haywood Lasso, MD on 03/10/2012 Cancer stage: ypT1c,ypN2a, MX    Oncology History Overview Note  # DEC 2012- RIGHT BREAST CA T2 N2 M0 tumor from biopsy.  Estrogen receptor positive, Progesterone receptor positive.  Current receptor negative by FISH 2. Neoadjuvant chemotherapy started in December of 28 with Cytoxan Adriamycin 3. Started on Taxol weekly chemotherapy. 4. Patient finished 12  cycles of Taxol chemotherapy in May of 2013.     5. Status post right modified radical mastectomy [Dr.Bowers; [place]] June of 2013, ypT1c  yp N2  MO. started also on Lerazole    7. Radiation therapy to the right breast (September of 2013).  Lymph node was positive for HER-2 receptor gene amplification of 2.22.  Will proceed with Herceptin treatment starting in September of 2013.   8.Patient has finished Herceptin (maintenance therapy) in August of 2014 8. Start patient on letrozole from November, 2013. 9. Patient started on Herceptin in September 2013.    10Patient finished 12 months of Herceptin therapy on August, 2014  # 6. Status post left side prophylactic mastectomy.  #Late MAY 2018-RECURRENCE BREAST CA- ER positive/PR negative; ?? HER-2/neu- [biopsy- proven-mediastinal lymph node; in suff for her 2 testing].  [elevated Tumor marker- CT/PET- uptake in Right Mediastinal LN; Sternum [June 2018 EBUS- Dr.Kasa]   # March 10 2017-  faslodex + Abema; OCT 5th CT-PR of mediastinal LN [consent]  #August 2020-left chest wall nodule biopsy benign  #August 2019- DVT-right upper extremity; Eliquis; stop end of March 2021 [repeat ultrasound negative/patient preference]; # JAN 2022-Cirrhosis- ? On CT scan-FEB 2022MRI liver- Negative fro overt cirrhosis --------------------------------------------------------------- -    DIAGNOSIS: [ BREAST CANCER- ER/PR/HER2 NEU POS  STAGE:  4   ;GOALS: PALLIATIVE  CURRENT/MOST RECENT THERAPY - ABEMA + FASLODEX    Carcinoma of lower-outer quadrant of right breast in female, estrogen receptor positive ([place])   INTERVAL HISTORY:  Kimberly [name] 65 y.o.  female pleasant patient above history of metastatic ER PR positive HER-2/neu positive breast cancer currently on abema+ Faslodex is here for follow-up.  At last visit patient was treated with ciprofloxacin for UTI for 5 days.  Patient continues to have urinary urgency/bloating.  Patient denies any fevers or chills.  Patient stopped her abemaciclib for 1 1 week [instructed to weeks as recommended because of neutropenia/UTI because of anxiety]   Review of Systems  Constitutional:  Positive for malaise/fatigue. Negative for chills, diaphoresis, fever and weight loss.  HENT:  Negative for nosebleeds and sore throat.   Eyes:  Negative for double vision.  Respiratory:  Negative for cough, hemoptysis, sputum production, shortness of breath and wheezing.   Cardiovascular:  Negative for chest pain, palpitations, orthopnea and leg swelling.  Gastrointestinal:  Negative for abdominal pain, blood in stool, constipation, heartburn, melena, nausea and vomiting.  Musculoskeletal:  Positive for back pain and joint pain.  Neurological:  Negative for dizziness, tingling, focal weakness, weakness and headaches.  Endo/Heme/Allergies:  Does not bruise/bleed easily.  Psychiatric/Behavioral:  Negative for depression. The patient is not nervous/anxious  and does not have insomnia.      PAST MEDICAL HISTORY :  Past Medical History:  Diagnosis Date   Arthritis    Cancer of lower-outer quadrant of female breast ([place]) 08/06/2011   RIGHT, chemo and bilateral mastectomies.   High cholesterol    History of kidney stones    Hx of bilateral breast implants    Hypertension    Lung metastases ([place]) [address]   chemo pills and faslidex shots.   PONV (postoperative nausea and vomiting)    Type II diabetes mellitus ([place])    fasting 140-150   Vertigo    LAST WEEK   Vertigo 01/2017    PAST SURGICAL HISTORY :   Past Surgical History:  Procedure Laterality Date   BREAST BIOPSY  07/2011, 2020   right   BREAST RECONSTRUCTION  03/07/2012   Procedure: BREAST RECONSTRUCTION;  Surgeon: Crissie Reese, MD;  Location: [place];  Service: Plastics;  Laterality: Left;  BREAST RECONSTRUCTION WITH PLACEMENT OF TISSUE EXPANDER TO LEFT BREAST   BREAST RECONSTRUCTION  02/06/2013   CESAREAN SECTION  [id number]   ENDOBRONCHIAL ULTRASOUND N/A 02/24/2017   Procedure: ENDOBRONCHIAL ULTRASOUND;  Surgeon: [name], MD;  Location: [facility];  Service: Cardiopulmonary;  Laterality: N/A;   LATISSIMUS FLAP TO BREAST Right 02/06/2013   Procedure: LATISSIMUS FLAP TO RIGHT BREAST WITH IMPLANT;  Surgeon: Crissie Reese, MD;  Location: [place];  Service: Plastics;  Laterality: Right;   MASTECTOMY  03/07/12   modified right; total left   MODIFIED MASTECTOMY  03/07/2012   Procedure: MODIFIED MASTECTOMY;  Surgeon: Haywood Lasso, MD;  Location: [place];  Service: General;  Laterality: Right;   PORTACATH PLACEMENT  07/2011   SCAR REVISION  03/30/2012   Procedure: SCAR REVISION;  Surgeon: Haywood Lasso, MD;  Location: [place];  Service: General;  Laterality: Right;  CLOSURE OF MASTECTOMY INCISION   WISDOM TOOTH EXTRACTION      FAMILY HISTORY :   Family History  Problem Relation Age of Onset   Breast cancer Mother    Diabetes Father     SOCIAL HISTORY:   Social  History   Tobacco Use   Smoking status: Never   Smokeless tobacco: Never  Vaping Use   Vaping Use: Never used  Substance Use Topics   Alcohol use: No   Drug use: No    ALLERGIES:  is allergic to sulfa antibiotics.  MEDICATIONS:  Current Outpatient Medications  Medication Sig Dispense Refill   abemaciclib (VERZENIO) 100 MG tablet Take 1 tablet (100 mg total) by mouth 2 (two) times daily. Swallow tablets whole. Do not chew, crush, or split tablets before swallowing. 56 tablet 3   atorvastatin (LIPITOR) 20 MG tablet Take 1 tablet (20 mg total) by mouth daily. 90 tablet 3   betamethasone valerate (VALISONE) 0.1 % cream Apply 0.1 application topically as needed.     fulvestrant (FASLODEX) 250 MG/5ML injection Inject into the muscle every 30 (thirty) days. One injection each buttock over 1-2 minutes. Warm prior to use.     glipiZIDE (GLUCOTROL XL) 10 MG 24 hr tablet Take 1 tablet (10 mg total) by mouth 2 (two) times daily. 180 tablet 3   insulin aspart (NOVOLOG FLEXPEN) 100 UNIT/ML FlexPen Inject 4 Units into the skin 3 (three) times daily with meals. 15 mL 0   Insulin Detemir (LEVEMIR) 100 UNIT/ML Pen Inject 20 Units into the skin at bedtime. 15 mL 0   levothyroxine (SYNTHROID) 25 MCG tablet Take 1  tablet (25 mcg total) by mouth daily before breakfast. 90 tablet 1   lisinopril (ZESTRIL) 10 MG tablet Take 10 mg by mouth daily.     loperamide (IMODIUM A-D) 2 MG tablet Take 2 mg by mouth 4 (four) times daily as needed for diarrhea or loose stools.     mupirocin cream (BACTROBAN) 2 % Apply 1 application topically 2 (two) times daily. 15 g 0   clotrimazole-betamethasone (LOTRISONE) cream SMARTSIG:Topical Morning-Evening     mupirocin ointment (BACTROBAN) 2 %      nitrofurantoin, macrocrystal-monohydrate, (MACROBID) 100 MG capsule Take 100 mg by mouth 2 (two) times daily. (Patient not taking: No sig reported)     ondansetron (ZOFRAN) 8 MG tablet Take 1 tablet (8 mg total) by mouth every 8 (eight)  hours as needed for nausea or vomiting. (Patient not taking: No sig reported) 20 tablet 3   ondansetron (ZOFRAN-ODT) 4 MG disintegrating tablet Take 1 tablet (4 mg total) by mouth every 8 (eight) hours as needed for nausea or vomiting. (Patient not taking: No sig reported) 20 tablet 3   prochlorperazine (COMPAZINE) 10 MG tablet Take 1 tablet (10 mg total) by mouth every 6 (six) hours as needed for nausea or vomiting. (Patient not taking: No sig reported) 30 tablet 0   promethazine (PHENERGAN) 25 MG tablet Take 1 tablet (25 mg total) by mouth every 6 (six) hours as needed for refractory nausea / vomiting. (Patient not taking: No sig reported) 30 tablet 0   No current facility-administered medications for this visit.   Facility-Administered Medications Ordered in Other Visits  Medication Dose Route Frequency Provider Last Rate Last Admin   fulvestrant (FASLODEX) injection 500 mg  500 mg Intramuscular Once [name], MD        PHYSICAL EXAMINATION: ECOG PERFORMANCE STATUS: 1 - Symptomatic but completely ambulatory  BP 107/61   Pulse 86   Temp (!) 97.5 F (36.4 C)   Resp 16   Wt 171 lb 15.3 oz (78 kg)   SpO2 98%   BMI 31.45 kg/m   Filed Weights   04/21/21 0852  Weight: 171 lb 15.3 oz (78 kg)    Physical Exam Constitutional:      Comments: She is alone.   HENT:     Head: Normocephalic and atraumatic.     Mouth/Throat:     Pharynx: No oropharyngeal exudate.  Eyes:     Pupils: Pupils are equal, round, and reactive to light.  Cardiovascular:     Rate and Rhythm: Normal rate and regular rhythm.  Pulmonary:     Effort: Pulmonary effort is normal. No respiratory distress.     Breath sounds: Normal breath sounds. No wheezing.  Abdominal:     General: Bowel sounds are normal. There is no distension.     Palpations: Abdomen is soft. There is no mass.     Tenderness: There is no abdominal tenderness. There is no guarding or rebound.  Musculoskeletal:        General: No  tenderness. Normal range of motion.     Cervical back: Normal range of motion and neck supple.     Comments: Approximately 1 cm hard nodule felt in the anterior chest wall/left parasternal.  Skin:    General: Skin is warm.  Neurological:     Mental Status: She is alert and oriented to person, place, and time.  Psychiatric:        Mood and Affect: Affect normal.    LABORATORY DATA:  I have  reviewed the data as listed    Component Value Date/Time   NA 133 (L) 04/21/2021 0846   NA 135 11/28/2014 1401   K 4.1 04/21/2021 0846   K 4.7 11/28/2014 1401   CL 100 04/21/2021 0846   CL 99 (L) 11/28/2014 1401   CO2 26 04/21/2021 0846   CO2 28 11/28/2014 1401   GLUCOSE 299 (H) 04/21/2021 0846   GLUCOSE 215 (H) 11/28/2014 1401   BUN 17 04/21/2021 0846   BUN 16 11/28/2014 1401   CREATININE 1.09 (H) 04/21/2021 0846   CREATININE 0.96 11/28/2014 1401   CALCIUM 9.4 04/21/2021 0846   CALCIUM 9.9 11/28/2014 1401   PROT 6.8 04/21/2021 0846   PROT 7.6 11/28/2014 1401   ALBUMIN 3.2 (L) 04/21/2021 0846   ALBUMIN 4.5 11/28/2014 1401   AST 34 04/21/2021 0846   AST 26 11/28/2014 1401   ALT 25 04/21/2021 0846   ALT 28 11/28/2014 1401   ALKPHOS 93 04/21/2021 0846   ALKPHOS 78 11/28/2014 1401   BILITOT 0.4 04/21/2021 0846   BILITOT 0.5 11/28/2014 1401   GFRNONAA 56 (L) 04/21/2021 0846   GFRNONAA >60 11/28/2014 1401   GFRAA >60 05/30/2020 1022   GFRAA >60 11/28/2014 1401    No results found for: SPEP, UPEP  Lab Results  Component Value Date   WBC 2.9 (L) 04/21/2021   NEUTROABS 1.1 (L) 04/21/2021   HGB 11.1 (L) 04/21/2021   HCT 31.8 (L) 04/21/2021   MCV 97.5 04/21/2021   PLT 188 04/21/2021      Chemistry      Component Value Date/Time   NA 133 (L) 04/21/2021 0846   NA 135 11/28/2014 1401   K 4.1 04/21/2021 0846   K 4.7 11/28/2014 1401   CL 100 04/21/2021 0846   CL 99 (L) 11/28/2014 1401   CO2 26 04/21/2021 0846   CO2 28 11/28/2014 1401   BUN 17 04/21/2021 0846   BUN 16  11/28/2014 1401   CREATININE 1.09 (H) 04/21/2021 0846   CREATININE 0.96 11/28/2014 1401      Component Value Date/Time   CALCIUM 9.4 04/21/2021 0846   CALCIUM 9.9 11/28/2014 1401   ALKPHOS 93 04/21/2021 0846   ALKPHOS 78 11/28/2014 1401   AST 34 04/21/2021 0846   AST 26 11/28/2014 1401   ALT 25 04/21/2021 0846   ALT 28 11/28/2014 1401   BILITOT 0.4 04/21/2021 0846   BILITOT 0.5 11/28/2014 1401       RADIOGRAPHIC STUDIES: I have personally reviewed the radiological images as listed and agreed with the findings in the report. No results found.   ASSESSMENT & PLAN:  Carcinoma of lower-outer quadrant of right breast in female, estrogen receptor positive ([place])  # Recurrent breast cancer-ER positive PR negative ? HER-2/neu-currently on Faslodex plus abema. MARCH 2022-  No evidence of metastatic disease in the chest, abdomen or pelvis- STABLE.   # Continue Faslodex; HOLD Abema 100 BID x4 weeks [see below]; Labs today- ANC- 1.1-; Hb 11.1; platlets-N- STABLE.  Will order scans today    # Poorly controlled BG- 299- awaiting endocrinology evaluation in aiugust; with diet and medications.   # recurrent UTI/ ? Suprapubic tenderness- check UA/culture- [HOLD abema x4 weeks]; after neutrophil count 1.1.  Patient is factors for repeated UTIs include poorly controlled diabetes/neutropenia from CDK inhibitor.  We will make a referral to urology.   #DISPOSITION:early AM appt # UA/urine culture today # urology referral re: recurrent UTIs # Faslodex/today  # Follow up in 4  weeks [[place]]-MD/ labs (CBC/CMP/Ca 27-29) Faslodex prior;CT CAP -Dr.B     Orders Placed This Encounter  Procedures   Urine culture    Standing Status:   Future    Number of Occurrences:   1    Standing Expiration Date:   04/21/2022   CT CHEST ABDOMEN PELVIS W CONTRAST    Standing Status:   Future    Standing Expiration Date:   04/21/2022    Order Specific Question:   Preferred imaging location?    Answer:   [place]  [facility]    Order Specific Question:   Radiology Contrast Protocol - do NOT remove file path    Answer:   [email or web address]   Urinalysis, Complete w Microscopic    Standing Status:   Future    Number of Occurrences:   1    Standing Expiration Date:   04/21/2022   CBC with Differential    Standing Status:   Standing    Number of Occurrences:   20    Standing Expiration Date:   04/21/2022   Comprehensive metabolic panel    Standing Status:   Standing    Number of Occurrences:   20    Standing Expiration Date:   04/21/2022   Cancer antigen 27.29    Standing Status:   Standing    Number of Occurrences:   20    Standing Expiration Date:   04/21/2022   Ambulatory referral to Urology    Referral Priority:   Routine    Referral Type:   Consultation    Referral Reason:   Specialty Services Required    Referred to Provider:   Hollice Espy, MD    Requested Specialty:   Urology    Number of Visits Requested:   1    All questions were answered. The patient knows to call the clinic with any problems, questions or concerns.      [name], MD 04/21/2021 9:32 AM

## 2021-04-21 NOTE — Assessment & Plan Note (Signed)
#  Recurrent breast cancer-ER positive PR negative ? HER-2/neu-currently on Faslodex plus abema. MARCH 2022-  No evidence of metastatic disease in the chest, abdomen or pelvis- STABLE.   # Continue Faslodex; HOLD Abema 100 BID x4 weeks [see below]; Labs today- ANC- 1.1-; Hb 11.1; platlets-N- STABLE.  Will order scans today    # Poorly controlled BG- 299- awaiting endocrinology evaluation in aiugust; with diet and medications.   # recurrent UTI/ ? Suprapubic tenderness- check UA/culture- [HOLD abema x4 weeks]; after neutrophil count 1.1.  Patient is factors for repeated UTIs include poorly controlled diabetes/neutropenia from CDK inhibitor.  We will make a referral to urology.   #DISPOSITION:early AM appt # UA/urine culture today # urology referral re: recurrent UTIs # Faslodex/today  # Follow up in 4 weeks [[place]]-MD/ labs (CBC/CMP/Ca 27-29) Faslodex prior;CT CAP -Dr.B

## 2021-04-21 NOTE — Patient Instructions (Signed)
#  Stop taking abemaciclib for 1 month; do not start until next visit/further instructions.

## 2021-04-22 ENCOUNTER — Telehealth: Payer: Self-pay | Admitting: *Deleted

## 2021-04-22 DIAGNOSIS — N390 Urinary tract infection, site not specified: Secondary | ICD-10-CM

## 2021-04-22 LAB — CANCER ANTIGEN 27.29: CA 27.29: 22.4 U/mL (ref 0.0–38.6)

## 2021-04-22 MED ORDER — CIPROFLOXACIN HCL 500 MG PO TABS
500.0000 mg | ORAL_TABLET | Freq: Two times a day (BID) | ORAL | 0 refills | Status: AC
Start: 2021-04-22 — End: 2021-04-29

## 2021-04-22 NOTE — Telephone Encounter (Signed)
Spoke with patient. Patient aware that her test results showed a UTI. CIPRO was called into her pharmacy. Pt instructed to take cipro 500 mg 1 tablet twice daily for 7 days. She was instructed to drink plenty of water/hydrate.

## 2021-04-22 NOTE — Telephone Encounter (Signed)
Per Dr Rogue Bussing Please call in a prescription for ciprofloxacin 500 mg twice a day; for total of 7 days for patient's UTI. Please have patient keep urology consult apts.

## 2021-04-23 LAB — URINE CULTURE: Culture: >=100000 — AB

## 2021-04-30 ENCOUNTER — Ambulatory Visit: Payer: PPO

## 2021-05-11 IMAGING — CT CT CHEST W/ CM
2 of 5 series · 12 of 36 positions shown, 15 images · IV contrast (omnipaque)
Comparison: 02/13/2019 CT chest, abdomen and pelvis.

CLINICAL DATA: Metastatic right breast cancer with history of
bilateral mastectomy and chemotherapy with ongoing medical therapy.
Restaging. 0VK69-BQ pneumonia 08/20/2019.

EXAM:
CT CHEST, ABDOMEN, AND PELVIS WITH CONTRAST
TECHNIQUE: Multidetector CT imaging of the chest, abdomen and pelvis was
performed following the standard protocol during bolus
administration of intravenous contrast.
CONTRAST:  85mL OMNIPAQUE IOHEXOL 300 MG/ML  SOLN

[Series 2: axials cap 5.00 · axial · 0.78mm/px · z∈[-1461,-941]mm · 9 of 131 slices shown, 12 images]
[im 14/131  mediastinal]
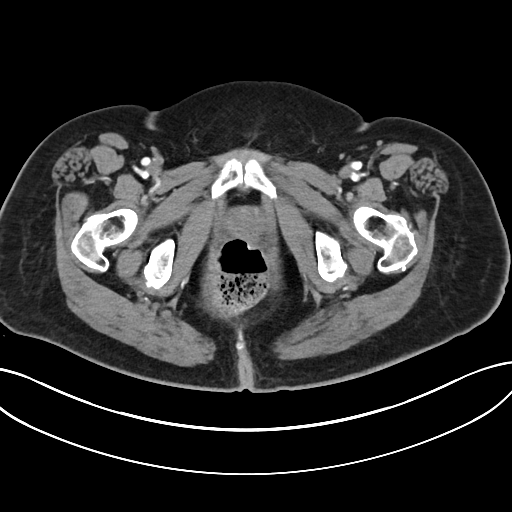
[im 14/131  lung]
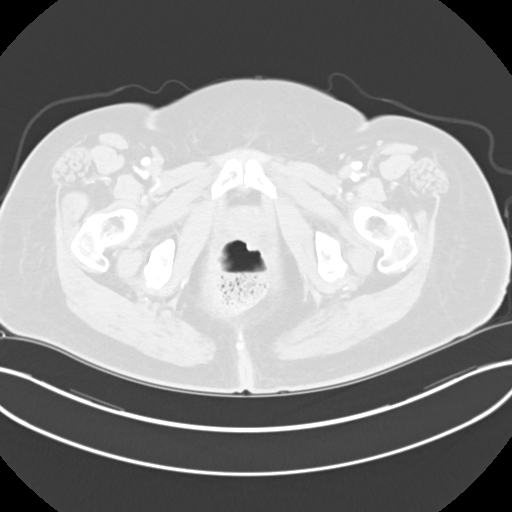
[im 27/131  lung]
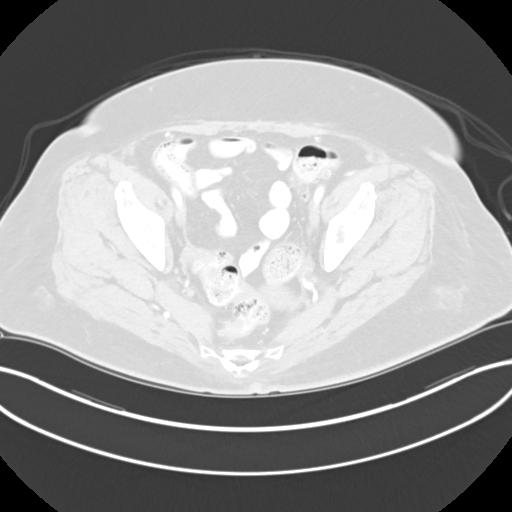
[im 40/131  lung]
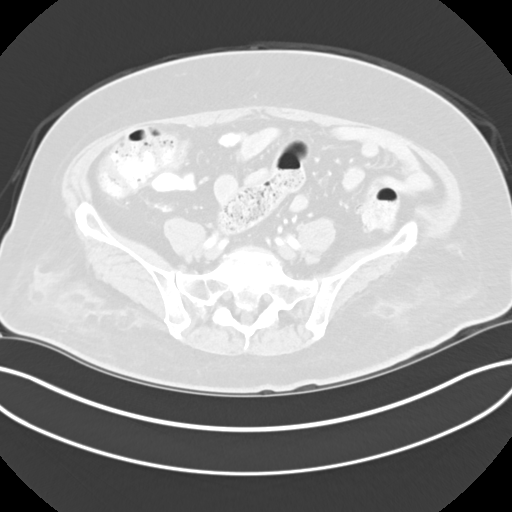
[im 53/131  lung]
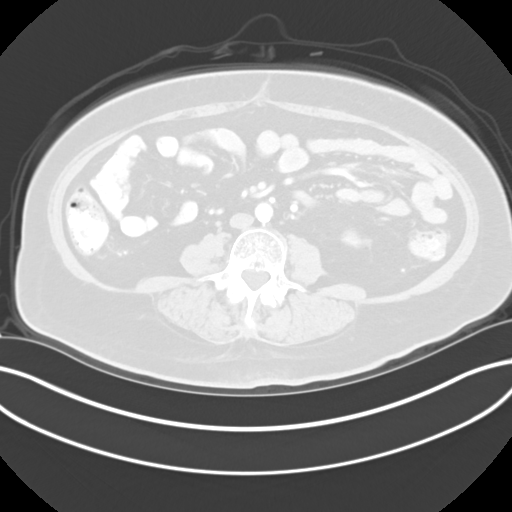
[im 66/131  mediastinal]
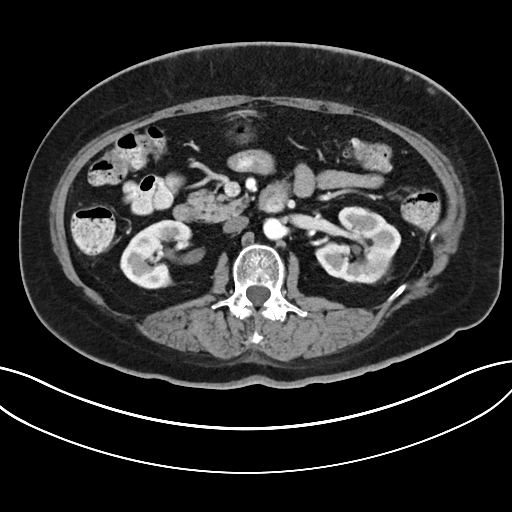
[im 66/131  lung]
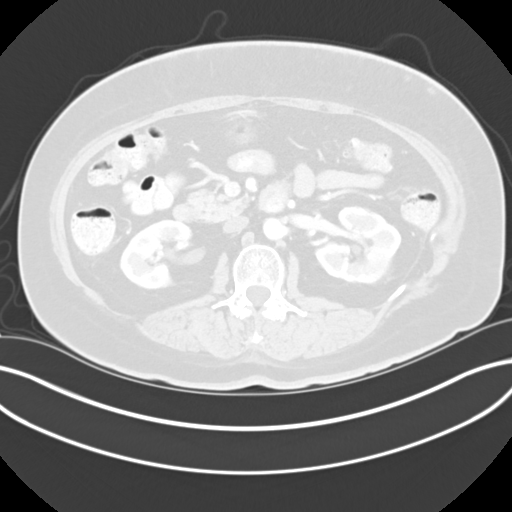
[im 79/131  lung]
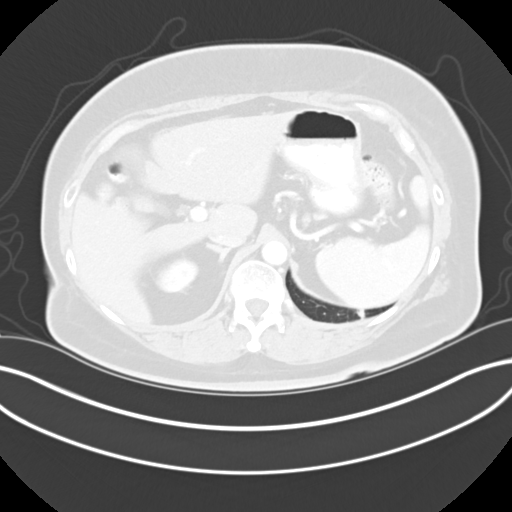
[im 92/131  lung]
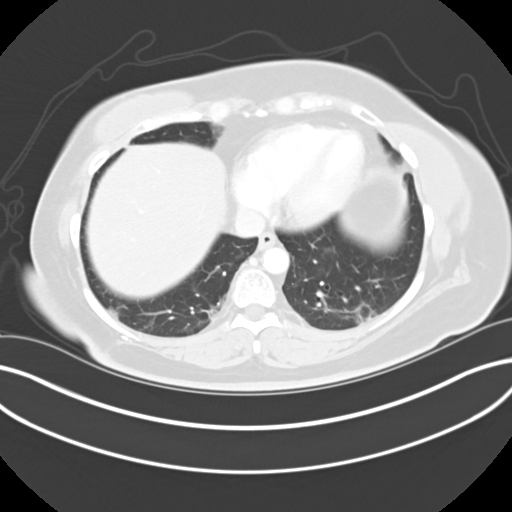
[im 105/131  lung]
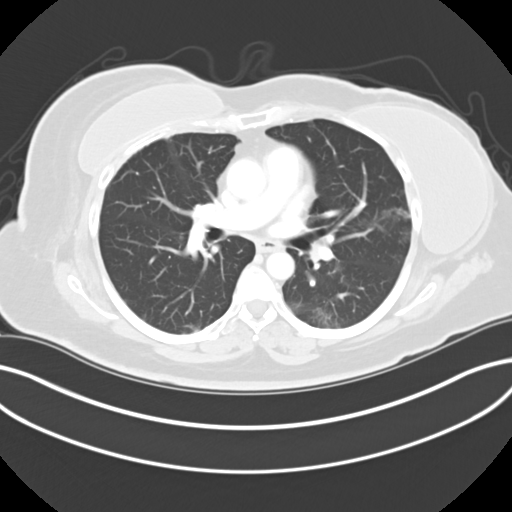
[im 118/131  mediastinal]
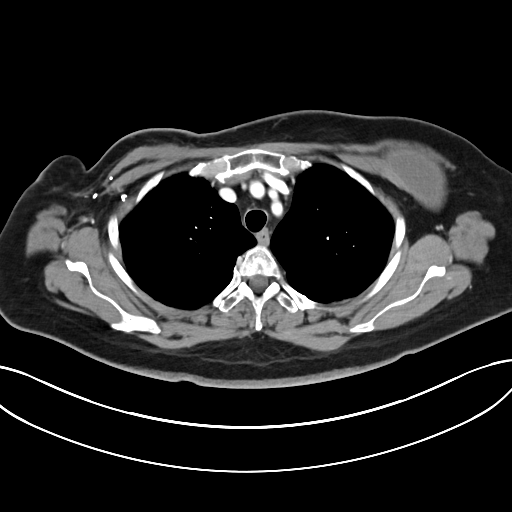
[im 118/131  lung]
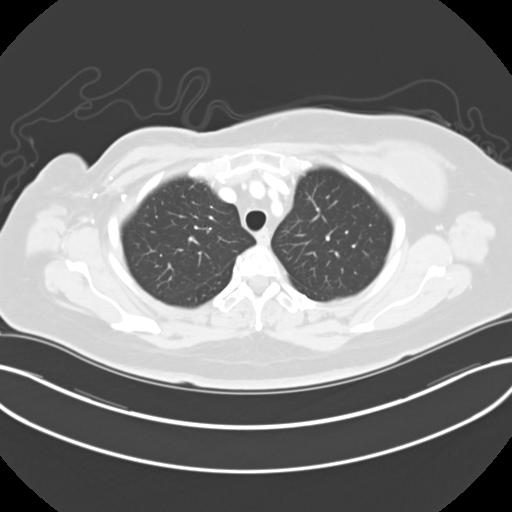

[Series 4: coronals cap 2.00 cor · coronal · 0.78mm/px · 3 of 141 slices shown]
[im 29/141  lung]
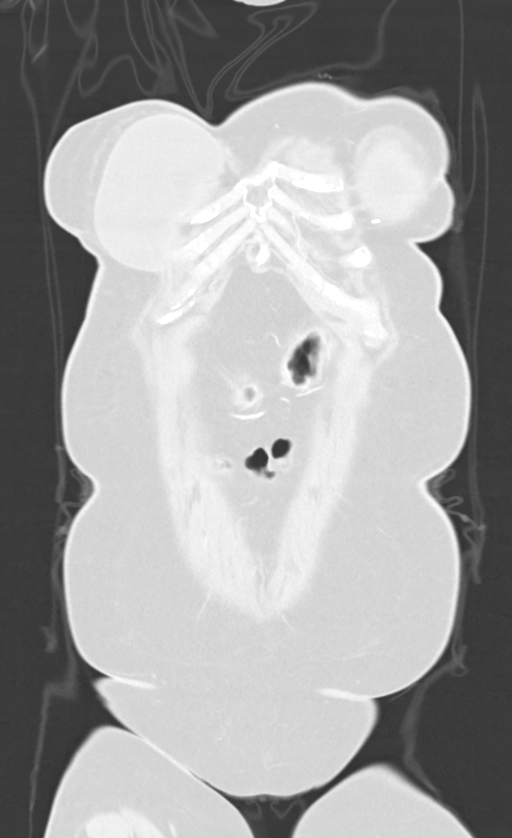
[im 57/141  lung]
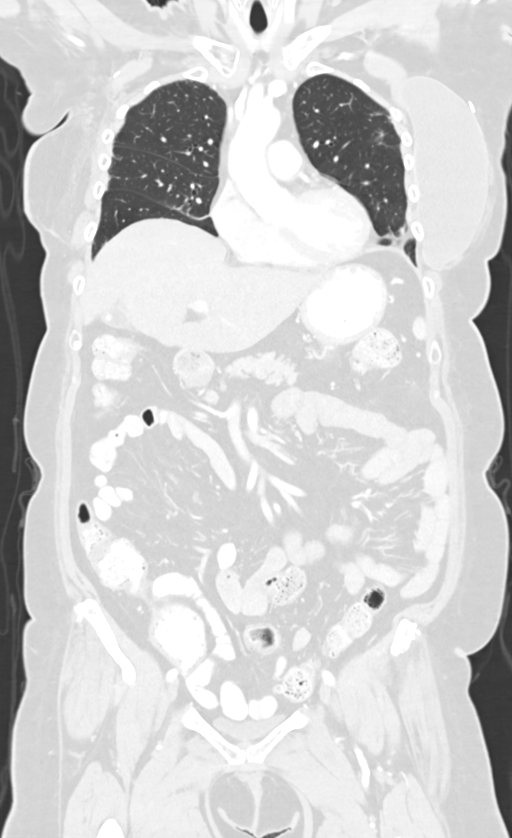
[im 85/141  lung]
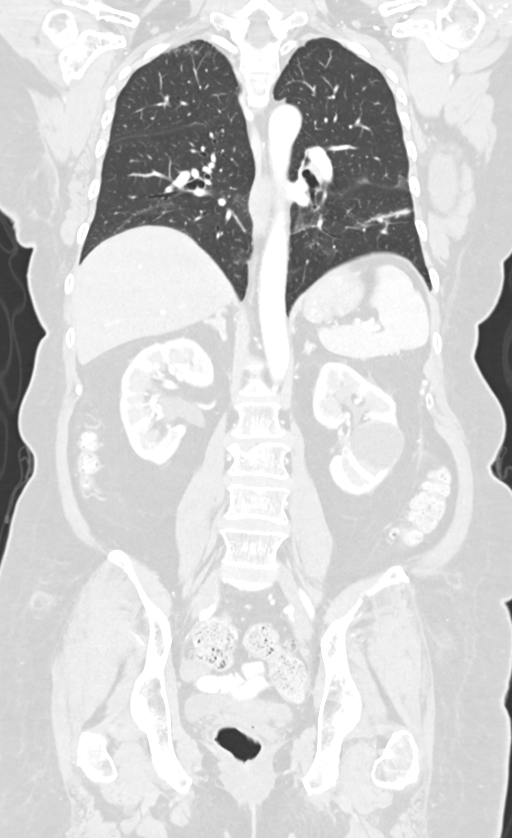

[12 of 36 positions shown; findings below may reference images not displayed]

FINDINGS: CT CHEST FINDINGS

Cardiovascular: Normal heart size. No significant pericardial
effusion/thickening. Left circumflex coronary atherosclerosis.
Mildly atherosclerotic nonaneurysmal thoracic aorta. Normal caliber
pulmonary arteries. No central pulmonary emboli.

Mediastinum/Nodes: No discrete thyroid nodules. Unremarkable
esophagus. Surgical clips again noted in the right axilla. Stable
soft tissue stranding in the right axilla. No pathologically
enlarged axillary lymph nodes. No mediastinal or hilar adenopathy.

Lungs/Pleura: No pneumothorax. No pleural effusion. Stable sharply
marginated mild patchy subpleural reticulation and ground-glass
opacity in the anterior apical right upper lobe compatible with mild
radiation fibrosis. No acute consolidative airspace disease, lung
masses or significant pulmonary nodules. Mild patchy ground-glass
opacity and reticulation throughout the periphery of both lungs is
new and compatible with resolving 0VK69-BQ pneumonia or early
postinflammatory fibrosis. Chronic parenchymal band in the lingula
is compatible with postinfectious/postinflammatory scarring.

Musculoskeletal: No aggressive appearing focal osseous lesions.
Minimal thoracic spondylosis. Bilateral mastectomy with bilateral
breast prostheses in place. Previously described subcutaneous soft
tissue lesion in the medial ventral left chest wall is unchanged
using similar measurement technique, measuring 0.8 cm (series
2/image 24).

CT ABDOMEN PELVIS FINDINGS

Hepatobiliary: Normal liver with no liver mass. Cholelithiasis. No
biliary ductal dilatation.

Pancreas: Normal, with no mass or duct dilation.

Spleen: Normal size. No mass.

Adrenals/Urinary Tract: Normal adrenals. Stable minimally complex
4.7 cm posterior lower left renal cyst with thin eccentric internal
septation, considered Bosniak category 2. Subcentimeter hypodense
renal cortical lesion in the lower right kidney is too small to
characterize and is unchanged, considered benign. No new renal
lesions. No hydronephrosis. Normal bladder.

Stomach/Bowel: Normal non-distended stomach. Normal caliber small
bowel with no small bowel wall thickening. Normal appendix. Oral
contrast transits to the colon. Mild sigmoid diverticulosis. No
large bowel wall thickening or acute pericolonic fat stranding.

Vascular/Lymphatic: Atherosclerotic nonaneurysmal abdominal aorta.
Patent portal, splenic, hepatic and renal veins. No pathologically
enlarged lymph nodes in the abdomen or pelvis.

Reproductive: Grossly normal retroverted uterus.  No adnexal mass.

Other: No pneumoperitoneum, ascites or focal fluid collection.

Musculoskeletal: No aggressive appearing focal osseous lesions.
Chronic mild L3 vertebral compression fracture. Mild lumbar
spondylosis.
IMPRESSION: 1. No evidence of metastatic disease in the chest, abdomen or
pelvis.
2. Mild patchy ground-glass opacity and reticulation at the
periphery of both lungs is new and compatible with resolving
0VK69-BQ pneumonia or early postinflammatory fibrosis. If there is
clinical concern for postinflammatory fibrosis, a follow-up
high-resolution chest CT study could be obtained in 6 months.
3. Aortic Atherosclerosis ([id number]). Additional chronic findings
as detailed.

## 2021-05-13 ENCOUNTER — Other Ambulatory Visit: Payer: Self-pay

## 2021-05-13 ENCOUNTER — Ambulatory Visit
Admission: RE | Admit: 2021-05-13 | Discharge: 2021-05-13 | Disposition: A | Discharge status: Home / Self Care | Payer: PPO | Source: Ambulatory Visit | Attending: Internal Medicine | Admitting: Internal Medicine

## 2021-05-13 DIAGNOSIS — J984 Other disorders of lung: Secondary | ICD-10-CM | POA: Diagnosis not present

## 2021-05-13 DIAGNOSIS — C50919 Malignant neoplasm of unspecified site of unspecified female breast: Secondary | ICD-10-CM | POA: Diagnosis not present

## 2021-05-13 DIAGNOSIS — I700 Atherosclerosis of aorta: Secondary | ICD-10-CM | POA: Diagnosis not present

## 2021-05-13 DIAGNOSIS — N281 Cyst of kidney, acquired: Secondary | ICD-10-CM | POA: Diagnosis not present

## 2021-05-13 DIAGNOSIS — C50511 Malignant neoplasm of lower-outer quadrant of right female breast: Secondary | ICD-10-CM | POA: Insufficient documentation

## 2021-05-13 DIAGNOSIS — K8020 Calculus of gallbladder without cholecystitis without obstruction: Secondary | ICD-10-CM | POA: Diagnosis not present

## 2021-05-13 DIAGNOSIS — Z170 Estrogen receptor positive status [ER+]: Secondary | ICD-10-CM | POA: Insufficient documentation

## 2021-05-13 DIAGNOSIS — K3189 Other diseases of stomach and duodenum: Secondary | ICD-10-CM | POA: Diagnosis not present

## 2021-05-13 DIAGNOSIS — I2510 Atherosclerotic heart disease of native coronary artery without angina pectoris: Secondary | ICD-10-CM | POA: Diagnosis not present

## 2021-05-13 MED ORDER — IOHEXOL 350 MG/ML SOLN
150.0000 mL | Freq: Once | INTRAVENOUS | Status: AC | PRN
  Administered 2021-05-13: 85 mL via INTRAVENOUS

## 2021-05-19 ENCOUNTER — Encounter: Payer: Self-pay | Admitting: Internal Medicine

## 2021-05-19 ENCOUNTER — Inpatient Hospital Stay: Payer: PPO | Admitting: Internal Medicine

## 2021-05-19 ENCOUNTER — Inpatient Hospital Stay: Payer: PPO

## 2021-05-19 ENCOUNTER — Other Ambulatory Visit: Payer: Self-pay

## 2021-05-19 ENCOUNTER — Inpatient Hospital Stay: Payer: PPO | Attending: Internal Medicine

## 2021-05-19 DIAGNOSIS — C50511 Malignant neoplasm of lower-outer quadrant of right female breast: Secondary | ICD-10-CM

## 2021-05-19 DIAGNOSIS — Z9013 Acquired absence of bilateral breasts and nipples: Secondary | ICD-10-CM | POA: Diagnosis not present

## 2021-05-19 DIAGNOSIS — Z170 Estrogen receptor positive status [ER+]: Secondary | ICD-10-CM

## 2021-05-19 DIAGNOSIS — I10 Essential (primary) hypertension: Secondary | ICD-10-CM | POA: Diagnosis not present

## 2021-05-19 DIAGNOSIS — Z794 Long term (current) use of insulin: Secondary | ICD-10-CM | POA: Insufficient documentation

## 2021-05-19 DIAGNOSIS — Z803 Family history of malignant neoplasm of breast: Secondary | ICD-10-CM | POA: Insufficient documentation

## 2021-05-19 DIAGNOSIS — E7800 Pure hypercholesterolemia, unspecified: Secondary | ICD-10-CM | POA: Diagnosis not present

## 2021-05-19 DIAGNOSIS — Z5111 Encounter for antineoplastic chemotherapy: Secondary | ICD-10-CM | POA: Diagnosis not present

## 2021-05-19 DIAGNOSIS — E119 Type 2 diabetes mellitus without complications: Secondary | ICD-10-CM | POA: Insufficient documentation

## 2021-05-19 DIAGNOSIS — Z923 Personal history of irradiation: Secondary | ICD-10-CM | POA: Insufficient documentation

## 2021-05-19 DIAGNOSIS — Z79899 Other long term (current) drug therapy: Secondary | ICD-10-CM | POA: Insufficient documentation

## 2021-05-19 LAB — COMPREHENSIVE METABOLIC PANEL
ALT: 48 U/L — ABNORMAL HIGH (ref 0–44)
AST: 51 U/L — ABNORMAL HIGH (ref 15–41)
Albumin: 3.5 g/dL (ref 3.5–5.0)
Alkaline Phosphatase: 111 U/L (ref 38–126)
Anion gap: 6 (ref 5–15)
BUN: 12 mg/dL (ref 8–23)
CO2: 25 mmol/L (ref 22–32)
Calcium: 9.1 mg/dL (ref 8.9–10.3)
Chloride: 101 mmol/L (ref 98–111)
Creatinine, Ser: 0.83 mg/dL (ref 0.44–1.00)
GFR, Estimated: >60 mL/min (ref >60)
Glucose, Bld: 241 mg/dL — ABNORMAL HIGH (ref 70–99)
Potassium: 3.5 mmol/L (ref 3.5–5.1)
Sodium: 132 mmol/L — ABNORMAL LOW (ref 135–145)
Total Bilirubin: 0.6 mg/dL (ref 0.3–1.2)
Total Protein: 7.1 g/dL (ref 6.5–8.1)

## 2021-05-19 LAB — CBC WITH DIFFERENTIAL/PLATELET
Abs Immature Granulocytes: 0.02 10*3/uL (ref 0.00–0.07)
Basophils Absolute: 0.1 10*3/uL (ref 0.0–0.1)
Basophils Relative: 2 %
Eosinophils Absolute: 0.1 10*3/uL (ref 0.0–0.5)
Eosinophils Relative: 3 %
HCT: 35.2 % — ABNORMAL LOW (ref 36.0–46.0)
Hemoglobin: 12.2 g/dL (ref 12.0–15.0)
Immature Granulocytes: 1 %
Lymphocytes Relative: 38 %
Lymphs Abs: 1.5 10*3/uL (ref 0.7–4.0)
MCH: 32.6 pg (ref 26.0–34.0)
MCHC: 34.7 g/dL (ref 30.0–36.0)
MCV: 94.1 fL (ref 80.0–100.0)
Monocytes Absolute: 0.3 10*3/uL (ref 0.1–1.0)
Monocytes Relative: 8 %
Neutro Abs: 1.9 10*3/uL (ref 1.7–7.7)
Neutrophils Relative %: 48 %
Platelets: 221 10*3/uL (ref 150–400)
RBC: 3.74 MIL/uL — ABNORMAL LOW (ref 3.87–5.11)
RDW: 12.8 % (ref 11.5–15.5)
WBC: 3.8 10*3/uL — ABNORMAL LOW (ref 4.0–10.5)
nRBC: 0 % (ref 0.0–0.2)

## 2021-05-19 MED ORDER — FULVESTRANT 250 MG/5ML IM SOLN
500.0000 mg | Freq: Once | INTRAMUSCULAR | Status: AC
  Administered 2021-05-19: 500 mg via INTRAMUSCULAR
  Filled 2021-05-19: qty 10

## 2021-05-19 NOTE — Patient Instructions (Signed)
#  Continue to hold your breast cancer medication/Verzenio for 1 more month/until next visit.

## 2021-05-19 NOTE — Progress Notes (Signed)
.[place] OFFICE PROGRESS NOTE  Patient Care Team: Ezequiel Kayser, MD (Inactive) as PCP - General (Internal Medicine) Neldon Mc, MD (General Surgery) Everlene Farrier, MD (Obstetrics and Gynecology) Noreene Filbert, MD Forest Gleason, MD (Inactive) (Unknown Physician Specialty) [name], MD as Consulting Physician (Hematology and Oncology)  Cancer Staging Carcinoma of lower-outer quadrant of right breast in female, estrogen receptor positive [facility]) Staging form: Breast, AJCC 7th Edition - Pathologic: ypT1c,ypN2a, MX - Signed by Haywood Lasso, MD on 03/10/2012 Cancer stage: ypT1c,ypN2a, MX    Oncology History Overview Note  # DEC 2012- RIGHT BREAST CA T2 N2 M0 tumor from biopsy.  Estrogen receptor positive, Progesterone receptor positive.  Current receptor negative by FISH 2. Neoadjuvant chemotherapy started in December of 28 with Cytoxan Adriamycin 3. Started on Taxol weekly chemotherapy. 4. Patient finished 12  cycles of Taxol chemotherapy in May of 2013.     5. Status post right modified radical mastectomy [Dr.Bowers; [place]] June of 2013, ypT1c  yp N2  MO. started also on Lerazole    7. Radiation therapy to the right breast (September of 2013).  Lymph node was positive for HER-2 receptor gene amplification of 2.22.  Will proceed with Herceptin treatment starting in September of 2013.   8.Patient has finished Herceptin (maintenance therapy) in August of 2014 8. Start patient on letrozole from November, 2013. 9. Patient started on Herceptin in September 2013.    10Patient finished 12 months of Herceptin therapy on August, 2014  # 6. Status post left side prophylactic mastectomy.  #Late MAY 2018-RECURRENCE BREAST CA- ER positive/PR negative; ?? HER-2/neu- [biopsy- proven-mediastinal lymph node; in suff for her 2 testing].  [elevated Tumor marker- CT/PET- uptake in Right Mediastinal LN; Sternum [June 2018 EBUS- Dr.Kasa]   # March 10 2017-  faslodex + Abema; OCT 5th CT-PR of mediastinal LN [consent]  #August 2020-left chest wall nodule biopsy benign  #August 2019- DVT-right upper extremity; Eliquis; stop end of March 2021 [repeat ultrasound negative/patient preference]; # JAN 2022-Cirrhosis- ? On CT scan-FEB 2022MRI liver- Negative fro overt cirrhosis --------------------------------------------------------------- -    DIAGNOSIS: [ BREAST CANCER- ER/PR/HER2 NEU POS  STAGE:  4   ;GOALS: PALLIATIVE  CURRENT/MOST RECENT THERAPY - ABEMA + FASLODEX    Carcinoma of lower-outer quadrant of right breast in female, estrogen receptor positive ([place])   INTERVAL HISTORY:  Kimberly [name] 65 y.o.  female pleasant patient above history of metastatic ER PR positive HER-2/neu positive breast cancer currently on abema+ Faslodex is here for follow-up/review results of the CT scan.     However had abema -likely cause of neutropenia is on hold for the last 4 weeks given the recurrent UTI.  The patient is currently awaiting to be evaluated by urology for recurrent UTI infections.  Appointment next 3 days.  No fever no chills.  No nausea vomiting.  No headaches.   Review of Systems  Constitutional:  Positive for malaise/fatigue. Negative for chills, diaphoresis, fever and weight loss.  HENT:  Negative for nosebleeds and sore throat.   Eyes:  Negative for double vision.  Respiratory:  Negative for cough, hemoptysis, sputum production, shortness of breath and wheezing.   Cardiovascular:  Negative for chest pain, palpitations, orthopnea and leg swelling.  Gastrointestinal:  Negative for abdominal pain, blood in stool, constipation, heartburn, melena, nausea and vomiting.  Musculoskeletal:  Positive for back pain and joint pain.  Neurological:  Negative for dizziness, tingling, focal weakness, weakness and headaches.  Endo/Heme/Allergies:  Does not bruise/bleed  easily.  Psychiatric/Behavioral:  Negative for depression. The patient is not  nervous/anxious and does not have insomnia.      PAST MEDICAL HISTORY :  Past Medical History:  Diagnosis Date  . Arthritis   . Cancer of lower-outer quadrant of female breast ([place]) 08/06/2011   RIGHT, chemo and bilateral mastectomies.  . High cholesterol   . History of kidney stones   . Hx of bilateral breast implants   . Hypertension   . Lung metastases ([place]) 20118   chemo pills and faslidex shots.  Marland Kitchen PONV (postoperative nausea and vomiting)   . Type II diabetes mellitus ([facility])    fasting 140-150  . Vertigo    LAST WEEK  . Vertigo 01/2017    PAST SURGICAL HISTORY :   Past Surgical History:  Procedure Laterality Date  . BREAST BIOPSY  07/2011, 2020   right  . BREAST RECONSTRUCTION  03/07/2012   Procedure: BREAST RECONSTRUCTION;  Surgeon: Crissie Reese, MD;  Location: [place];  Service: Plastics;  Laterality: Left;  BREAST RECONSTRUCTION WITH PLACEMENT OF TISSUE EXPANDER TO LEFT BREAST  . BREAST RECONSTRUCTION  02/06/2013  . CESAREAN SECTION  [id number]  . ENDOBRONCHIAL ULTRASOUND N/A 02/24/2017   Procedure: ENDOBRONCHIAL ULTRASOUND;  Surgeon: [name], MD;  Location: [facility];  Service: Cardiopulmonary;  Laterality: N/A;  . LATISSIMUS FLAP TO BREAST Right 02/06/2013   Procedure: LATISSIMUS FLAP TO RIGHT BREAST WITH IMPLANT;  Surgeon: Crissie Reese, MD;  Location: [facility];  Service: Plastics;  Laterality: Right;  . MASTECTOMY  03/07/12   modified right; total left  . MODIFIED MASTECTOMY  03/07/2012   Procedure: MODIFIED MASTECTOMY;  Surgeon: Haywood Lasso, MD;  Location: [place];  Service: General;  Laterality: Right;  . PORTACATH PLACEMENT  07/2011  . SCAR REVISION  03/30/2012   Procedure: SCAR REVISION;  Surgeon: Haywood Lasso, MD;  Location: [place];  Service: General;  Laterality: Right;  CLOSURE OF MASTECTOMY INCISION  . WISDOM TOOTH EXTRACTION      FAMILY HISTORY :   Family History  Problem Relation Age of Onset  . Breast cancer Mother   . Diabetes  Father     SOCIAL HISTORY:   Social History   Tobacco Use  . Smoking status: Never  . Smokeless tobacco: Never  Vaping Use  . Vaping Use: Never used  Substance Use Topics  . Alcohol use: No  . Drug use: No    ALLERGIES:  is allergic to sulfa antibiotics.  MEDICATIONS:  Current Outpatient Medications  Medication Sig Dispense Refill  . atorvastatin (LIPITOR) 20 MG tablet Take 1 tablet (20 mg total) by mouth daily. 90 tablet 3  . betamethasone valerate (VALISONE) 0.1 % cream Apply 0.1 application topically as needed.    . clotrimazole-betamethasone (LOTRISONE) cream SMARTSIG:Topical Morning-Evening    . fulvestrant (FASLODEX) 250 MG/5ML injection Inject into the muscle every 30 (thirty) days. One injection each buttock over 1-2 minutes. Warm prior to use.    Marland Kitchen glipiZIDE (GLUCOTROL XL) 10 MG 24 hr tablet Take 1 tablet (10 mg total) by mouth 2 (two) times daily. 180 tablet 3  . insulin aspart (NOVOLOG FLEXPEN) 100 UNIT/ML FlexPen Inject 4 Units into the skin 3 (three) times daily with meals. 15 mL 0  . Insulin Detemir (LEVEMIR) 100 UNIT/ML Pen Inject 20 Units into the skin at bedtime. 15 mL 0  . levothyroxine (SYNTHROID) 25 MCG tablet Take 1 tablet (25 mcg total) by mouth daily before breakfast. 90 tablet 1  .  lisinopril (ZESTRIL) 10 MG tablet Take 10 mg by mouth daily.    Marland Kitchen loperamide (IMODIUM A-D) 2 MG tablet Take 2 mg by mouth 4 (four) times daily as needed for diarrhea or loose stools.    . mupirocin cream (BACTROBAN) 2 % Apply 1 application topically 2 (two) times daily. 15 g 0  . mupirocin ointment (BACTROBAN) 2 %     . abemaciclib (VERZENIO) 100 MG tablet Take 1 tablet (100 mg total) by mouth 2 (two) times daily. Swallow tablets whole. Do not chew, crush, or split tablets before swallowing. (Patient not taking: Reported on 05/19/2021) 56 tablet 3  . nitrofurantoin, macrocrystal-monohydrate, (MACROBID) 100 MG capsule Take 100 mg by mouth 2 (two) times daily. (Patient not taking: No  sig reported)    . ondansetron (ZOFRAN) 8 MG tablet Take 1 tablet (8 mg total) by mouth every 8 (eight) hours as needed for nausea or vomiting. (Patient not taking: No sig reported) 20 tablet 3  . ondansetron (ZOFRAN-ODT) 4 MG disintegrating tablet Take 1 tablet (4 mg total) by mouth every 8 (eight) hours as needed for nausea or vomiting. (Patient not taking: No sig reported) 20 tablet 3  . prochlorperazine (COMPAZINE) 10 MG tablet Take 1 tablet (10 mg total) by mouth every 6 (six) hours as needed for nausea or vomiting. (Patient not taking: No sig reported) 30 tablet 0  . promethazine (PHENERGAN) 25 MG tablet Take 1 tablet (25 mg total) by mouth every 6 (six) hours as needed for refractory nausea / vomiting. (Patient not taking: No sig reported) 30 tablet 0   No current facility-administered medications for this visit.    PHYSICAL EXAMINATION: ECOG PERFORMANCE STATUS: 1 - Symptomatic but completely ambulatory  BP (!) 143/81   Pulse 89   Temp (!) 96.8 F (36 C)   Resp 16   Wt 171 lb 11.8 oz (77.9 kg)   SpO2 98%   BMI 31.41 kg/m   Filed Weights   05/19/21 0949  Weight: 171 lb 11.8 oz (77.9 kg)    Physical Exam Constitutional:      Comments: She is alone.   HENT:     Head: Normocephalic and atraumatic.     Mouth/Throat:     Pharynx: No oropharyngeal exudate.  Eyes:     Pupils: Pupils are equal, round, and reactive to light.  Cardiovascular:     Rate and Rhythm: Normal rate and regular rhythm.  Pulmonary:     Effort: Pulmonary effort is normal. No respiratory distress.     Breath sounds: Normal breath sounds. No wheezing.  Abdominal:     General: Bowel sounds are normal. There is no distension.     Palpations: Abdomen is soft. There is no mass.     Tenderness: There is no abdominal tenderness. There is no guarding or rebound.  Musculoskeletal:        General: No tenderness. Normal range of motion.     Cervical back: Normal range of motion and neck supple.     Comments:  Approximately 1 cm hard nodule felt in the anterior chest wall/left parasternal.  Skin:    General: Skin is warm.  Neurological:     Mental Status: She is alert and oriented to person, place, and time.  Psychiatric:        Mood and Affect: Affect normal.    LABORATORY DATA:  I have reviewed the data as listed    Component Value Date/Time   NA 132 (L) 05/19/2021 1275  NA 135 11/28/2014 1401   K 3.5 05/19/2021 0942   K 4.7 11/28/2014 1401   CL 101 05/19/2021 0942   CL 99 (L) 11/28/2014 1401   CO2 25 05/19/2021 0942   CO2 28 11/28/2014 1401   GLUCOSE 241 (H) 05/19/2021 0942   GLUCOSE 215 (H) 11/28/2014 1401   BUN 12 05/19/2021 0942   BUN 16 11/28/2014 1401   CREATININE 0.83 05/19/2021 0942   CREATININE 0.96 11/28/2014 1401   CALCIUM 9.1 05/19/2021 0942   CALCIUM 9.9 11/28/2014 1401   PROT 7.1 05/19/2021 0942   PROT 7.6 11/28/2014 1401   ALBUMIN 3.5 05/19/2021 0942   ALBUMIN 4.5 11/28/2014 1401   AST 51 (H) 05/19/2021 0942   AST 26 11/28/2014 1401   ALT 48 (H) 05/19/2021 0942   ALT 28 11/28/2014 1401   ALKPHOS 111 05/19/2021 0942   ALKPHOS 78 11/28/2014 1401   BILITOT 0.6 05/19/2021 0942   BILITOT 0.5 11/28/2014 1401   GFRNONAA >60 05/19/2021 0942   GFRNONAA >60 11/28/2014 1401   GFRAA >60 05/30/2020 1022   GFRAA >60 11/28/2014 1401    No results found for: SPEP, UPEP  Lab Results  Component Value Date   WBC 3.8 (L) 05/19/2021   NEUTROABS 1.9 05/19/2021   HGB 12.2 05/19/2021   HCT 35.2 (L) 05/19/2021   MCV 94.1 05/19/2021   PLT 221 05/19/2021      Chemistry      Component Value Date/Time   NA 132 (L) 05/19/2021 0942   NA 135 11/28/2014 1401   K 3.5 05/19/2021 0942   K 4.7 11/28/2014 1401   CL 101 05/19/2021 0942   CL 99 (L) 11/28/2014 1401   CO2 25 05/19/2021 0942   CO2 28 11/28/2014 1401   BUN 12 05/19/2021 0942   BUN 16 11/28/2014 1401   CREATININE 0.83 05/19/2021 0942   CREATININE 0.96 11/28/2014 1401      Component Value Date/Time   CALCIUM  9.1 05/19/2021 0942   CALCIUM 9.9 11/28/2014 1401   ALKPHOS 111 05/19/2021 0942   ALKPHOS 78 11/28/2014 1401   AST 51 (H) 05/19/2021 0942   AST 26 11/28/2014 1401   ALT 48 (H) 05/19/2021 0942   ALT 28 11/28/2014 1401   BILITOT 0.6 05/19/2021 0942   BILITOT 0.5 11/28/2014 1401       RADIOGRAPHIC STUDIES: I have personally reviewed the radiological images as listed and agreed with the findings in the report. No results found.   ASSESSMENT & PLAN:  Carcinoma of lower-outer quadrant of right breast in female, estrogen receptor positive ([place])  # Recurrent breast cancer-ER positive PR negative ? HER-2/neu-currently on Faslodex plus abema. SEP 14th, 2022- Stable examinations without evidence of local recurrence or metastatic disease in the chest, abdomen or pelvis. Stable treatment changes anteriorly in the right upper lobe and in the right axilla.    # Continue Faslodex; CONTINUE to HOLD Abema 100 BID x more 4 weeks [see below]; Labs today- ANC- 1.9-; Hb 12; platlets-N- STABLE.   # Poorly controlled BG- 299- awaiting endocrinology evaluation in aiugust; with diet and medications.   # recurrent UTI/ ? Suprapubic tenderness- check UA/culture- [HOLD abema for 4 more weeks]; after neutrophil count 1.9.  Patient is factors for repeated UTIs include poorly controlled diabetes/neutropenia from CDK inhibitor.  awaitig evaluation with urology.   #DISPOSITION:early AM appt # Faslodex/today  # Follow up in 4 weeksMD/ labs [CBC/CMP/Ca 27-29] Faslodex-Dr.B   # I reviewed the blood work- with the patient  in detail; also reviewed the imaging independently [as summarized above]; and with the patient in detail.     No orders of the defined types were placed in this encounter.   All questions were answered. The patient knows to call the clinic with any problems, questions or concerns.      [name], MD 05/19/2021 11:23 AM

## 2021-05-19 NOTE — Assessment & Plan Note (Addendum)
#  Recurrent breast cancer-ER positive PR negative ? HER-2/neu-currently on Faslodex plus abema. SEP 14th, 2022- Stable examinations without evidence of local recurrence or metastatic disease in the chest, abdomen or pelvis. Stable treatment changes anteriorly in the right upper lobe and in the right axilla.  # Continue Faslodex; CONTINUE to HOLD Abema 100 BID x more 4 weeks [see below]; Labs today- ANC- 1.9-; Hb 12; platlets-N- STABLE.   # Poorly controlled BG- 299- awaiting endocrinology evaluation in aiugust; with diet and medications.   # recurrent UTI/ ? Suprapubic tenderness- check UA/culture- [HOLD abema for 4 more weeks]; after neutrophil count 1.9.  Patient is factors for repeated UTIs include poorly controlled diabetes/neutropenia from CDK inhibitor.  awaitig evaluation with urology.   #DISPOSITION:early AM appt # Faslodex/today  # Follow up in 4 weeksMD/ labs [CBC/CMP/Ca 27-29] Faslodex-Dr.B   # I reviewed the blood work- with the patient in detail; also reviewed the imaging independently [as summarized above]; and with the patient in detail.

## 2021-05-20 LAB — CANCER ANTIGEN 27.29: CA 27.29: 28 U/mL (ref 0.0–38.6)

## 2021-05-20 NOTE — Progress Notes (Signed)
05/22/2021 8:51 AM   Kimberly [name] [birth year] [id number]  Referring provider: [name], MD [place],  [place] [id number]  Chief Complaint  Patient presents with   Dysuria    HPI: 65 year old female who presents today for further evaluation of dysuria.  She has a personal history of breast cancer, ER/PR positive diagnosed in 2016 with recurrence in 2018 currently on Faslodex (abema being held due to neutropenia and recurrent UTIs).  Was recent imaging in the form of CT chest abdomen pelvis on 05/13/2021 shows no significant GU pathology.  She does have an incidental renal cyst which is mildly complex but stable.  She has had for 4-5 documented possible urinary tract infections over the past calendar year including infections with Klebsiella/E. coli.  Her most recent infection was on 04/21/2021.  She has a personal history of diabetes.  Her last hemoglobin A1c was 13.6 on 08/2020.  She reports that her primary care physician left and she is had a hard time getting her blood sugar under control.  She is now been referred to her endocrinologist and is waiting that appointment.  She does mention today she has started doing a water aerobics class over the past year.  Both her and her sister who was in the class with her had several UTIs this year.  She wonders if this is a contributing factor.  Prior to this year, she has had no UTIs.  She is asymptomatic today other than some chronic low back pain.  When she has a UTI, she endorses traditional symptoms including dysuria, urgency frequency.  On several other occasions though, she is on minimal symptoms.  PMH: Past Medical History:  Diagnosis Date   Arthritis    Cancer of lower-outer quadrant of female breast ([place]) 08/06/2011   RIGHT, chemo and bilateral mastectomies.   High cholesterol    History of kidney stones    Hx of bilateral breast implants    Hypertension    Lung metastases ([place]) 20118    chemo pills and faslidex shots.   PONV (postoperative nausea and vomiting)    Type II diabetes mellitus ([place])    fasting 140-150   Vertigo    LAST WEEK   Vertigo 01/2017    Surgical History: Past Surgical History:  Procedure Laterality Date   BREAST BIOPSY  07/2011, 2020   right   BREAST RECONSTRUCTION  03/07/2012   Procedure: BREAST RECONSTRUCTION;  Surgeon: Crissie Reese, MD;  Location: [facility];  Service: Plastics;  Laterality: Left;  BREAST RECONSTRUCTION WITH PLACEMENT OF TISSUE EXPANDER TO LEFT BREAST   BREAST RECONSTRUCTION  02/06/2013   CESAREAN SECTION  [id number]   ENDOBRONCHIAL ULTRASOUND N/A 02/24/2017   Procedure: ENDOBRONCHIAL ULTRASOUND;  Surgeon: [name], MD;  Location: [facility];  Service: Cardiopulmonary;  Laterality: N/A;   LATISSIMUS FLAP TO BREAST Right 02/06/2013   Procedure: LATISSIMUS FLAP TO RIGHT BREAST WITH IMPLANT;  Surgeon: Crissie Reese, MD;  Location: [facility];  Service: Plastics;  Laterality: Right;   MASTECTOMY  03/07/12   modified right; total left   MODIFIED MASTECTOMY  03/07/2012   Procedure: MODIFIED MASTECTOMY;  Surgeon: Haywood Lasso, MD;  Location: [facility];  Service: General;  Laterality: Right;   PORTACATH PLACEMENT  07/2011   SCAR REVISION  03/30/2012   Procedure: SCAR REVISION;  Surgeon: Haywood Lasso, MD;  Location: [facility];  Service: General;  Laterality: Right;  CLOSURE OF MASTECTOMY INCISION   WISDOM TOOTH EXTRACTION  Home Medications:  Allergies as of 05/22/2021       Reactions   Sulfa Antibiotics Rash        Medication List        Accurate as of May 22, 2021  8:51 AM. If you have any questions, ask your nurse or doctor.          STOP taking these medications    abemaciclib 100 MG tablet Commonly known as: Verzenio Stopped by: Hollice Espy, MD   betamethasone valerate 0.1 % cream Commonly known as: VALISONE Stopped by: Hollice Espy, MD   mupirocin cream 2 % Commonly known as:  Bactroban Stopped by: Hollice Espy, MD   mupirocin ointment 2 % Commonly known as: BACTROBAN Stopped by: Hollice Espy, MD   nitrofurantoin (macrocrystal-monohydrate) 100 MG capsule Commonly known as: MACROBID Stopped by: Hollice Espy, MD   ondansetron 8 MG tablet Commonly known as: ZOFRAN Stopped by: Hollice Espy, MD   prochlorperazine 10 MG tablet Commonly known as: COMPAZINE Stopped by: Hollice Espy, MD   promethazine 25 MG tablet Commonly known as: PHENERGAN Stopped by: Hollice Espy, MD       TAKE these medications    atorvastatin 20 MG tablet Commonly known as: LIPITOR Take 1 tablet (20 mg total) by mouth daily.   clotrimazole-betamethasone cream Commonly known as: LOTRISONE SMARTSIG:Topical Morning-Evening   fulvestrant 250 MG/5ML injection Commonly known as: FASLODEX Inject into the muscle every 30 (thirty) days. One injection each buttock over 1-2 minutes. Warm prior to use.   glipiZIDE 10 MG 24 hr tablet Commonly known as: GLUCOTROL XL Take 1 tablet (10 mg total) by mouth 2 (two) times daily.   insulin detemir 100 UNIT/ML FlexPen Commonly known as: LEVEMIR Inject 20 Units into the skin at bedtime.   levothyroxine 25 MCG tablet Commonly known as: SYNTHROID Take 1 tablet (25 mcg total) by mouth daily before breakfast.   lisinopril 10 MG tablet Commonly known as: ZESTRIL Take 10 mg by mouth daily.   loperamide 2 MG tablet Commonly known as: IMODIUM A-D Take 2 mg by mouth 4 (four) times daily as needed for diarrhea or loose stools.   NovoLOG FlexPen 100 UNIT/ML FlexPen Generic drug: insulin aspart Inject 4 Units into the skin 3 (three) times daily with meals.   ondansetron 4 MG disintegrating tablet Commonly known as: ZOFRAN-ODT Take 1 tablet (4 mg total) by mouth every 8 (eight) hours as needed for nausea or vomiting.        Allergies:  Allergies  Allergen Reactions   Sulfa Antibiotics Rash    Family History: Family  History  Problem Relation Age of Onset   Breast cancer Mother    Diabetes Father     Social History:  reports that she has never smoked. She has never used smokeless tobacco. She reports that she does not drink alcohol and does not use drugs.   Physical Exam: BP 127/82   Pulse 97   Ht 5' 2.5" (1.588 m)   Wt 171 lb (77.6 kg)   BMI 30.78 kg/m   Constitutional:  Alert and oriented, No acute distress. HEENT: [place] AT, moist mucus membranes.  Trachea midline, no masses. Cardiovascular: No clubbing, cyanosis, or edema. Respiratory: Normal respiratory effort, no increased work of breathing. GI: Abdomen is soft, nontender, nondistended, no abdominal masses GU: No CVA tenderness Skin: No rashes, bruises or suspicious lesions. Neurologic: Grossly intact, no focal deficits, moving all 4 extremities. Psychiatric: Normal mood and affect.  Laboratory Data: Lab Results  Component  Value Date   WBC 3.8 (L) 05/19/2021   HGB 12.2 05/19/2021   HCT 35.2 (L) 05/19/2021   MCV 94.1 05/19/2021   PLT 221 05/19/2021    Lab Results  Component Value Date   CREATININE 0.83 05/19/2021     Urinalysis Results for orders placed or performed during the hospital encounter of 05/22/21  Urinalysis, Complete w Microscopic  Result Value Ref Range   Color, Urine YELLOW YELLOW   APPearance CLEAR CLEAR   Specific Gravity, Urine 1.025 1.005 - 1.030   pH 5.0 5.0 - 8.0   Glucose, UA >1,000 (A) NEGATIVE mg/dL   Hgb urine dipstick TRACE (A) NEGATIVE   Bilirubin Urine NEGATIVE NEGATIVE   Ketones, ur TRACE (A) NEGATIVE mg/dL   Protein, ur NEGATIVE NEGATIVE mg/dL   Nitrite NEGATIVE NEGATIVE   Leukocytes,Ua TRACE (A) NEGATIVE   Squamous Epithelial / LPF 0-5 0 - 5   WBC, UA 11-20 0 - 5 WBC/hpf   RBC / HPF 0-5 0 - 5 RBC/hpf   Bacteria, UA FEW (A) NONE SEEN     Pertinent Imaging: IMPRESSION: 1. Stable examinations without evidence of local recurrence or metastatic disease in the chest, abdomen or  pelvis. 2. Stable treatment changes anteriorly in the right upper lobe and in the right axilla. 3. No acute findings. 4. Stable incidental findings including cholelithiasis, a left renal cyst, coronary and Aortic Atherosclerosis (ICD10-I70.0).     Electronically Signed   By: Richardean Sale M.D.   On: 05/13/2021 16:40  CT chest/ abd/ pelvis personally reviewed, agree with radiologic trepidation  Assessment & Plan:    1. History of recurrent UTIs Contributing factors include primarily poorly controlled blood sugar and low estrogen state  No evidence of upper tract pathology or contributing urinary, behavioral issues  We discussed some UTI prevention strategies including cranberry tablets twice daily, d-mannose, and daily probiotic.  Poor candidate for topical estrogen given recurrent breast cancer  We discussed the option of prophylactic antibiotics first 3 to 6 months, will hold off for now due to concern for antibiotic stewardship, development of multidrug-resistant infection and will try to correct her blood sugar as above as primary intervention  If she continues to have recurrent infections, may consider cystoscopy and/or prophylaxis  She is agreeable this plan  2. Poorly controlled diabetes mellitus ([place]) As above, stressed that this is likely the primary contributing factor especially since previous years, her blood sugars been better controlled and now with markedly elevated A1c, her infections have been recurrent  Agree with endocrinology referral  3. Renal cyst Incidental, appears benign and she is undergoing serial imaging   F/u prn  Hollice Espy, MD  [facility] [address] [place], [place] [address] [phone]

## 2021-05-21 ENCOUNTER — Other Ambulatory Visit: Payer: Self-pay | Admitting: *Deleted

## 2021-05-21 DIAGNOSIS — R300 Dysuria: Secondary | ICD-10-CM

## 2021-05-22 ENCOUNTER — Encounter: Payer: Self-pay | Admitting: Urology

## 2021-05-22 ENCOUNTER — Other Ambulatory Visit
Admission: RE | Admit: 2021-05-22 | Discharge: 2021-05-22 | Disposition: A | Discharge status: Home / Self Care | Payer: PPO | Attending: Urology | Admitting: Urology

## 2021-05-22 ENCOUNTER — Other Ambulatory Visit: Payer: Self-pay

## 2021-05-22 ENCOUNTER — Ambulatory Visit: Payer: PPO | Admitting: Urology

## 2021-05-22 VITALS — BP 127/82 | HR 97 | Ht 62.5 in | Wt 171.0 lb

## 2021-05-22 DIAGNOSIS — E1165 Type 2 diabetes mellitus with hyperglycemia: Secondary | ICD-10-CM | POA: Diagnosis not present

## 2021-05-22 DIAGNOSIS — N281 Cyst of kidney, acquired: Secondary | ICD-10-CM

## 2021-05-22 DIAGNOSIS — Z87440 Personal history of urinary (tract) infections: Secondary | ICD-10-CM | POA: Diagnosis not present

## 2021-05-22 DIAGNOSIS — R300 Dysuria: Secondary | ICD-10-CM | POA: Insufficient documentation

## 2021-05-22 LAB — URINALYSIS, COMPLETE (UACMP) WITH MICROSCOPIC
Bilirubin Urine: NEGATIVE
Glucose, UA: >1000 mg/dL — AB
Nitrite: NEGATIVE
Protein, ur: NEGATIVE mg/dL
Specific Gravity, Urine: 1.025 (ref 1.005–1.030)
pH: 5 (ref 5.0–8.0)

## 2021-05-22 NOTE — Patient Instructions (Signed)
We discussed the following interventions today:  1.  Its imperative that you get your blood sugars under better control.  Until this happens, you will likely continue to have recurrent bladder infections.  Please work with your primary care on this.  2.  Given your history of breast cancer, you are not a great candidate for topical estrogen but this is likely contributing to vaginal dryness and thus recurrent infections.  3.  I like you to start taking cranberry tablets twice daily, a daily probiotic as well as a supplement d-mannose.  These may help her then some of the infections.  4.  When you think you have signs or symptoms of infection, we are always happy to see you in urology clinic for same or next day visit working with one of our PAs.  5.  We may consider cystoscopy down the road if you keep having infections.  There is no evidence that you have any kidney issues contributing to your infections.

## 2021-06-16 ENCOUNTER — Inpatient Hospital Stay: Payer: PPO | Attending: Internal Medicine

## 2021-06-16 ENCOUNTER — Telehealth: Payer: Self-pay | Admitting: Pharmacy Technician

## 2021-06-16 ENCOUNTER — Inpatient Hospital Stay: Payer: PPO

## 2021-06-16 ENCOUNTER — Other Ambulatory Visit: Payer: Self-pay

## 2021-06-16 ENCOUNTER — Inpatient Hospital Stay: Payer: PPO | Admitting: Internal Medicine

## 2021-06-16 DIAGNOSIS — D701 Agranulocytosis secondary to cancer chemotherapy: Secondary | ICD-10-CM | POA: Diagnosis not present

## 2021-06-16 DIAGNOSIS — Z170 Estrogen receptor positive status [ER+]: Secondary | ICD-10-CM | POA: Insufficient documentation

## 2021-06-16 DIAGNOSIS — Z79899 Other long term (current) drug therapy: Secondary | ICD-10-CM | POA: Diagnosis not present

## 2021-06-16 DIAGNOSIS — C771 Secondary and unspecified malignant neoplasm of intrathoracic lymph nodes: Secondary | ICD-10-CM | POA: Diagnosis not present

## 2021-06-16 DIAGNOSIS — Z9013 Acquired absence of bilateral breasts and nipples: Secondary | ICD-10-CM | POA: Insufficient documentation

## 2021-06-16 DIAGNOSIS — E119 Type 2 diabetes mellitus without complications: Secondary | ICD-10-CM | POA: Insufficient documentation

## 2021-06-16 DIAGNOSIS — Z23 Encounter for immunization: Secondary | ICD-10-CM

## 2021-06-16 DIAGNOSIS — T451X5A Adverse effect of antineoplastic and immunosuppressive drugs, initial encounter: Secondary | ICD-10-CM | POA: Insufficient documentation

## 2021-06-16 DIAGNOSIS — C50511 Malignant neoplasm of lower-outer quadrant of right female breast: Secondary | ICD-10-CM

## 2021-06-16 DIAGNOSIS — Z5111 Encounter for antineoplastic chemotherapy: Secondary | ICD-10-CM | POA: Diagnosis not present

## 2021-06-16 DIAGNOSIS — I10 Essential (primary) hypertension: Secondary | ICD-10-CM | POA: Insufficient documentation

## 2021-06-16 DIAGNOSIS — Z794 Long term (current) use of insulin: Secondary | ICD-10-CM | POA: Insufficient documentation

## 2021-06-16 LAB — COMPREHENSIVE METABOLIC PANEL
ALT: 48 U/L — ABNORMAL HIGH (ref 0–44)
AST: 53 U/L — ABNORMAL HIGH (ref 15–41)
Albumin: 3.7 g/dL (ref 3.5–5.0)
Alkaline Phosphatase: 166 U/L — ABNORMAL HIGH (ref 38–126)
Anion gap: 9 (ref 5–15)
BUN: 15 mg/dL (ref 8–23)
CO2: 22 mmol/L (ref 22–32)
Calcium: 9 mg/dL (ref 8.9–10.3)
Chloride: 98 mmol/L (ref 98–111)
Creatinine, Ser: 0.78 mg/dL (ref 0.44–1.00)
GFR, Estimated: >60 mL/min (ref >60)
Glucose, Bld: 434 mg/dL — ABNORMAL HIGH (ref 70–99)
Potassium: 4 mmol/L (ref 3.5–5.1)
Sodium: 129 mmol/L — ABNORMAL LOW (ref 135–145)
Total Bilirubin: 0.4 mg/dL (ref 0.3–1.2)
Total Protein: 7.6 g/dL (ref 6.5–8.1)

## 2021-06-16 LAB — CBC WITH DIFFERENTIAL/PLATELET
Abs Immature Granulocytes: 0.02 10*3/uL (ref 0.00–0.07)
Basophils Absolute: 0 10*3/uL (ref 0.0–0.1)
Basophils Relative: 1 %
Eosinophils Absolute: 0.2 10*3/uL (ref 0.0–0.5)
Eosinophils Relative: 5 %
HCT: 36.5 % (ref 36.0–46.0)
Hemoglobin: 12.5 g/dL (ref 12.0–15.0)
Immature Granulocytes: 1 %
Lymphocytes Relative: 32 %
Lymphs Abs: 1.1 10*3/uL (ref 0.7–4.0)
MCH: 31.6 pg (ref 26.0–34.0)
MCHC: 34.2 g/dL (ref 30.0–36.0)
MCV: 92.4 fL (ref 80.0–100.0)
Monocytes Absolute: 0.3 10*3/uL (ref 0.1–1.0)
Monocytes Relative: 9 %
Neutro Abs: 1.9 10*3/uL (ref 1.7–7.7)
Neutrophils Relative %: 52 %
Platelets: 205 10*3/uL (ref 150–400)
RBC: 3.95 MIL/uL (ref 3.87–5.11)
RDW: 12.5 % (ref 11.5–15.5)
WBC: 3.5 10*3/uL — ABNORMAL LOW (ref 4.0–10.5)
nRBC: 0 % (ref 0.0–0.2)

## 2021-06-16 MED ORDER — INFLUENZA VAC A&B SA ADJ QUAD 0.5 ML IM PRSY
0.5000 mL | PREFILLED_SYRINGE | Freq: Once | INTRAMUSCULAR | Status: AC
  Administered 2021-06-16: 0.5 mL via INTRAMUSCULAR
  Filled 2021-06-16: qty 0.5

## 2021-06-16 MED ORDER — FULVESTRANT 250 MG/5ML IM SOLN
500.0000 mg | Freq: Once | INTRAMUSCULAR | Status: AC
  Administered 2021-06-16: 500 mg via INTRAMUSCULAR
  Filled 2021-06-16: qty 10

## 2021-06-16 NOTE — Progress Notes (Signed)
Patient would like to get her flu shot. She got her covid booster on Friday.

## 2021-06-16 NOTE — Telephone Encounter (Signed)
Oral Oncology Patient Advocate Encounter  Met patient in lobby to complete renewal application for [employer] in an effort to reduce patient's out of pocket expense for Verzenio to $0.    Application completed and faxed to [phone].   [employer] patient assistance phone number for follow up is [phone].   This encounter will be updated until final determination.   [place] Patient [place] Phone [phone] Fax [phone] 06/17/2021 3:22 PM

## 2021-06-16 NOTE — Progress Notes (Signed)
.[name] OFFICE PROGRESS NOTE  Patient Care Team: Ezequiel Kayser, MD (Inactive) as PCP - General (Internal Medicine) Neldon Mc, MD (General Surgery) Everlene Farrier, MD (Obstetrics and Gynecology) Noreene Filbert, MD Forest Gleason, MD (Inactive) (Unknown Physician Specialty) [name], MD as Consulting Physician (Hematology and Oncology)  Cancer Staging Carcinoma of lower-outer quadrant of right breast in female, estrogen receptor positive [facility]) Staging form: Breast, AJCC 7th Edition - Pathologic: ypT1c,ypN2a, MX - Signed by Haywood Lasso, MD on 03/10/2012 Cancer stage: ypT1c,ypN2a, MX    Oncology History Overview Note  # DEC 2012- RIGHT BREAST CA T2 N2 M0 tumor from biopsy.  Estrogen receptor positive, Progesterone receptor positive.  Current receptor negative by FISH 2. Neoadjuvant chemotherapy started in December of 28 with Cytoxan Adriamycin 3. Started on Taxol weekly chemotherapy. 4. Patient finished 12  cycles of Taxol chemotherapy in May of 2013.     5. Status post right modified radical mastectomy [Dr.Bowers; [place]] June of 2013, ypT1c  yp N2  MO. started also on Lerazole    7. Radiation therapy to the right breast (September of 2013).  Lymph node was positive for HER-2 receptor gene amplification of 2.22.  Will proceed with Herceptin treatment starting in September of 2013.   8.Patient has finished Herceptin (maintenance therapy) in August of 2014 8. Start patient on letrozole from November, 2013. 9. Patient started on Herceptin in September 2013.    10Patient finished 12 months of Herceptin therapy on August, 2014  # 6. Status post left side prophylactic mastectomy.  #Late MAY 2018-RECURRENCE BREAST CA- ER positive/PR negative; ?? HER-2/neu- [biopsy- proven-mediastinal lymph node; in suff for her 2 testing].  [elevated Tumor marker- CT/PET- uptake in Right Mediastinal LN; Sternum [June 2018 EBUS- Dr.Kasa]   # March 10 2017-  faslodex + Abema; OCT 5th CT-PR of mediastinal LN [consent]  #August 2020-left chest wall nodule biopsy benign  #August 2019- DVT-right upper extremity; Eliquis; stop end of March 2021 [repeat ultrasound negative/patient preference]; # JAN 2022-Cirrhosis- ? On CT scan-FEB 2022MRI liver- Negative fro overt cirrhosis --------------------------------------------------------------- -    DIAGNOSIS: [ BREAST CANCER- ER/PR/HER2 NEU POS  STAGE:  4   ;GOALS: PALLIATIVE  CURRENT/MOST RECENT THERAPY - ABEMA + FASLODEX    Carcinoma of lower-outer quadrant of right breast in female, estrogen receptor positive ([place])   INTERVAL HISTORY: Alone.  Ambulating independently.  [name] 65 y.o.  female pleasant patient above history of metastatic ER PR positive HER-2/neu positive breast cancer currently on abema+ Faslodex is here for follow-up. Abema on hold for 2 months because of neutropenia/recurrent infections bladder.  In the interim patient was evaluated by urology.  Unfortunately patient is currently awaiting to see endocrinology.  She does not check her blood sugars frequently.  Denies any recent bladder infections.  No fever no chills.  No nausea vomiting.  No headaches.   Review of Systems  Constitutional:  Positive for malaise/fatigue. Negative for chills, diaphoresis, fever and weight loss.  HENT:  Negative for nosebleeds and sore throat.   Eyes:  Negative for double vision.  Respiratory:  Negative for cough, hemoptysis, sputum production, shortness of breath and wheezing.   Cardiovascular:  Negative for chest pain, palpitations, orthopnea and leg swelling.  Gastrointestinal:  Negative for abdominal pain, blood in stool, constipation, heartburn, melena, nausea and vomiting.  Musculoskeletal:  Positive for back pain and joint pain.  Neurological:  Negative for dizziness, tingling, focal weakness, weakness and headaches.  Endo/Heme/Allergies:  Does not bruise/bleed  easily.   Psychiatric/Behavioral:  Negative for depression. The patient is not nervous/anxious and does not have insomnia.      PAST MEDICAL HISTORY :  Past Medical History:  Diagnosis Date  . Arthritis   . Cancer of lower-outer quadrant of female breast ([place]) 08/06/2011   RIGHT, chemo and bilateral mastectomies.  . High cholesterol   . History of kidney stones   . Hx of bilateral breast implants   . Hypertension   . Lung metastases ([place]) 20118   chemo pills and faslidex shots.  Marland Kitchen PONV (postoperative nausea and vomiting)   . Type II diabetes mellitus ([facility])    fasting 140-150  . Vertigo    LAST WEEK  . Vertigo 01/2017    PAST SURGICAL HISTORY :   Past Surgical History:  Procedure Laterality Date  . BREAST BIOPSY  07/2011, 2020   right  . BREAST RECONSTRUCTION  03/07/2012   Procedure: BREAST RECONSTRUCTION;  Surgeon: Crissie Reese, MD;  Location: [place];  Service: Plastics;  Laterality: Left;  BREAST RECONSTRUCTION WITH PLACEMENT OF TISSUE EXPANDER TO LEFT BREAST  . BREAST RECONSTRUCTION  02/06/2013  . CESAREAN SECTION  [id number]  . ENDOBRONCHIAL ULTRASOUND N/A 02/24/2017   Procedure: ENDOBRONCHIAL ULTRASOUND;  Surgeon: [name], MD;  Location: [facility];  Service: Cardiopulmonary;  Laterality: N/A;  . LATISSIMUS FLAP TO BREAST Right 02/06/2013   Procedure: LATISSIMUS FLAP TO RIGHT BREAST WITH IMPLANT;  Surgeon: Crissie Reese, MD;  Location: [place];  Service: Plastics;  Laterality: Right;  . MASTECTOMY  03/07/12   modified right; total left  . MODIFIED MASTECTOMY  03/07/2012   Procedure: MODIFIED MASTECTOMY;  Surgeon: Haywood Lasso, MD;  Location: [place];  Service: General;  Laterality: Right;  . PORTACATH PLACEMENT  07/2011  . SCAR REVISION  03/30/2012   Procedure: SCAR REVISION;  Surgeon: Haywood Lasso, MD;  Location: [place];  Service: General;  Laterality: Right;  CLOSURE OF MASTECTOMY INCISION  . WISDOM TOOTH EXTRACTION      FAMILY HISTORY :   Family History   Problem Relation Age of Onset  . Breast cancer Mother   . Diabetes Father     SOCIAL HISTORY:   Social History   Tobacco Use  . Smoking status: Never  . Smokeless tobacco: Never  Vaping Use  . Vaping Use: Never used  Substance Use Topics  . Alcohol use: No  . Drug use: No    ALLERGIES:  is allergic to sulfa antibiotics.  MEDICATIONS:  Current Outpatient Medications  Medication Sig Dispense Refill  . atorvastatin (LIPITOR) 20 MG tablet Take 1 tablet (20 mg total) by mouth daily. 90 tablet 3  . clotrimazole-betamethasone (LOTRISONE) cream SMARTSIG:Topical Morning-Evening    . fulvestrant (FASLODEX) 250 MG/5ML injection Inject into the muscle every 30 (thirty) days. One injection each buttock over 1-2 minutes. Warm prior to use.    Marland Kitchen glipiZIDE (GLUCOTROL XL) 10 MG 24 hr tablet Take 1 tablet (10 mg total) by mouth 2 (two) times daily. 180 tablet 3  . insulin aspart (NOVOLOG FLEXPEN) 100 UNIT/ML FlexPen Inject 4 Units into the skin 3 (three) times daily with meals. 15 mL 0  . Insulin Detemir (LEVEMIR) 100 UNIT/ML Pen Inject 20 Units into the skin at bedtime. 15 mL 0  . levothyroxine (SYNTHROID) 25 MCG tablet Take 1 tablet (25 mcg total) by mouth daily before breakfast. 90 tablet 1  . lisinopril (ZESTRIL) 10 MG tablet Take 10 mg by mouth daily.    Marland Kitchen  loperamide (IMODIUM A-D) 2 MG tablet Take 2 mg by mouth 4 (four) times daily as needed for diarrhea or loose stools. (Patient not taking: Reported on 06/16/2021)    . ondansetron (ZOFRAN-ODT) 4 MG disintegrating tablet Take 1 tablet (4 mg total) by mouth every 8 (eight) hours as needed for nausea or vomiting. (Patient not taking: Reported on 06/16/2021) 20 tablet 3   No current facility-administered medications for this visit.    PHYSICAL EXAMINATION: ECOG PERFORMANCE STATUS: 1 - Symptomatic but completely ambulatory  BP 130/79 (BP Location: Left Arm, Patient Position: Sitting)   Pulse 98   Temp (!) 97.3 F (36.3 C) (Tympanic)    Resp 18   Wt 169 lb (76.7 kg)   SpO2 98%   BMI 30.42 kg/m   Filed Weights   06/16/21 0956  Weight: 169 lb (76.7 kg)    Physical Exam Constitutional:      Comments: She is alone.   HENT:     Head: Normocephalic and atraumatic.     Mouth/Throat:     Pharynx: No oropharyngeal exudate.  Eyes:     Pupils: Pupils are equal, round, and reactive to light.  Cardiovascular:     Rate and Rhythm: Normal rate and regular rhythm.  Pulmonary:     Effort: Pulmonary effort is normal. No respiratory distress.     Breath sounds: Normal breath sounds. No wheezing.  Abdominal:     General: Bowel sounds are normal. There is no distension.     Palpations: Abdomen is soft. There is no mass.     Tenderness: There is no abdominal tenderness. There is no guarding or rebound.  Musculoskeletal:        General: No tenderness. Normal range of motion.     Cervical back: Normal range of motion and neck supple.     Comments: Approximately 1 cm hard nodule felt in the anterior chest wall/left parasternal.  Skin:    General: Skin is warm.  Neurological:     Mental Status: She is alert and oriented to person, place, and time.  Psychiatric:        Mood and Affect: Affect normal.    LABORATORY DATA:  I have reviewed the data as listed    Component Value Date/Time   NA 129 (L) 06/16/2021 0908   NA 135 11/28/2014 1401   K 4.0 06/16/2021 0908   K 4.7 11/28/2014 1401   CL 98 06/16/2021 0908   CL 99 (L) 11/28/2014 1401   CO2 22 06/16/2021 0908   CO2 28 11/28/2014 1401   GLUCOSE 434 (H) 06/16/2021 0908   GLUCOSE 215 (H) 11/28/2014 1401   BUN 15 06/16/2021 0908   BUN 16 11/28/2014 1401   CREATININE 0.78 06/16/2021 0908   CREATININE 0.96 11/28/2014 1401   CALCIUM 9.0 06/16/2021 0908   CALCIUM 9.9 11/28/2014 1401   PROT 7.6 06/16/2021 0908   PROT 7.6 11/28/2014 1401   ALBUMIN 3.7 06/16/2021 0908   ALBUMIN 4.5 11/28/2014 1401   AST 53 (H) 06/16/2021 0908   AST 26 11/28/2014 1401   ALT 48 (H)  06/16/2021 0908   ALT 28 11/28/2014 1401   ALKPHOS 166 (H) 06/16/2021 0908   ALKPHOS 78 11/28/2014 1401   BILITOT 0.4 06/16/2021 0908   BILITOT 0.5 11/28/2014 1401   GFRNONAA >60 06/16/2021 0908   GFRNONAA >60 11/28/2014 1401   GFRAA >60 05/30/2020 1022   GFRAA >60 11/28/2014 1401    No results found for: SPEP, UPEP  Lab Results  Component Value Date   WBC 3.5 (L) 06/16/2021   NEUTROABS 1.9 06/16/2021   HGB 12.5 06/16/2021   HCT 36.5 06/16/2021   MCV 92.4 06/16/2021   PLT 205 06/16/2021      Chemistry      Component Value Date/Time   NA 129 (L) 06/16/2021 0908   NA 135 11/28/2014 1401   K 4.0 06/16/2021 0908   K 4.7 11/28/2014 1401   CL 98 06/16/2021 0908   CL 99 (L) 11/28/2014 1401   CO2 22 06/16/2021 0908   CO2 28 11/28/2014 1401   BUN 15 06/16/2021 0908   BUN 16 11/28/2014 1401   CREATININE 0.78 06/16/2021 0908   CREATININE 0.96 11/28/2014 1401      Component Value Date/Time   CALCIUM 9.0 06/16/2021 0908   CALCIUM 9.9 11/28/2014 1401   ALKPHOS 166 (H) 06/16/2021 0908   ALKPHOS 78 11/28/2014 1401   AST 53 (H) 06/16/2021 0908   AST 26 11/28/2014 1401   ALT 48 (H) 06/16/2021 0908   ALT 28 11/28/2014 1401   BILITOT 0.4 06/16/2021 0908   BILITOT 0.5 11/28/2014 1401       RADIOGRAPHIC STUDIES: I have personally reviewed the radiological images as listed and agreed with the findings in the report. No results found.   ASSESSMENT & PLAN:  Carcinoma of lower-outer quadrant of right breast in female, estrogen receptor positive ([place])  # Recurrent breast cancer-ER positive PR negative ? HER-2/neu-currently on Faslodex plus abema. SEP 14th, 2022- Stable examinations without evidence of local recurrence or metastatic disease in the chest, abdomen or pelvis. Stable treatment changes anteriorly in the right upper lobe and in the right axilla.    # Continue Faslodex; RE-START Abema 100 BID [held last 1 months because of neutropenia] Labs today-WBC- 3.5; Hb 12;  platlets-N- STABLE.    # Poorly controlled BG- 434- awaiting endocrinology evaluation in aiugust; with diet and medications.   # recurrent UTI/ ? Suprapubic tenderness- S/p eval with urology; resolved.  Restart Abema.   #DISPOSITION:early AM appt # Flu shot today # Faslodex/today  # Follow up in 4 weeksMD/ labs [CBC/CMP/Ca 27-29] Faslodex-Dr.B      Orders Placed This Encounter  Procedures  . CBC with Differential    Standing Status:   Future    Standing Expiration Date:   06/16/2022  . Comprehensive metabolic panel    Standing Status:   Future    Standing Expiration Date:   06/16/2022  . Cancer antigen 27.29    Standing Status:   Future    Standing Expiration Date:   06/16/2022     All questions were answered. The patient knows to call the clinic with any problems, questions or concerns.      [name], MD 06/16/2021 10:44 AM

## 2021-06-16 NOTE — Assessment & Plan Note (Addendum)
#  Recurrent breast cancer-ER positive PR negative ? HER-2/neu-currently on Faslodex plus abema. SEP 14th, 2022- Stable examinations without evidence of local recurrence or metastatic disease in the chest, abdomen or pelvis. Stable treatment changes anteriorly in the right upper lobe and in the right axilla.  # Continue Faslodex; RE-START Abema 100 BID [held last 1 months because of neutropenia] Labs today-WBC- 3.5; Hb 12; platlets-N- STABLE.    # Poorly controlled BG- 434- awaiting endocrinology evaluation in aiugust; with diet and medications.   # recurrent UTI/ ? Suprapubic tenderness- S/p eval with urology; resolved.  Restart Abema.   #DISPOSITION:early AM appt # Flu shot today # Faslodex/today  # Follow up in 4 weeksMD/ labs [CBC/CMP/Ca 27-29] Faslodex-Dr.B    

## 2021-06-17 LAB — CANCER ANTIGEN 27.29: CA 27.29: 26.5 U/mL (ref 0.0–38.6)

## 2021-06-19 NOTE — Telephone Encounter (Signed)
Oral Oncology Patient Advocate Encounter  Received notification from [facility] Patient Assistance program that patient has been successfully enrolled into their program to receive Verzenio from the manufacturer at $0 out of pocket until 08/21/2023.    I called and spoke with patient.  She knows we will have to re-apply.   Specialty Pharmacy that will dispense medication is [facility] .  Patient knows to call the office with questions or concerns.   Oral Oncology Clinic will continue to follow.  [place] Patient [place] Phone [phone] Fax [phone] 06/19/2021 8:58 AM

## 2021-07-01 ENCOUNTER — Other Ambulatory Visit: Payer: Self-pay | Admitting: Internal Medicine

## 2021-07-01 DIAGNOSIS — Z170 Estrogen receptor positive status [ER+]: Secondary | ICD-10-CM

## 2021-07-01 DIAGNOSIS — C50511 Malignant neoplasm of lower-outer quadrant of right female breast: Secondary | ICD-10-CM

## 2021-07-14 ENCOUNTER — Other Ambulatory Visit: Payer: Self-pay

## 2021-07-14 ENCOUNTER — Inpatient Hospital Stay: Payer: PPO

## 2021-07-14 ENCOUNTER — Encounter: Payer: Self-pay | Admitting: Internal Medicine

## 2021-07-14 ENCOUNTER — Inpatient Hospital Stay: Payer: PPO | Attending: Internal Medicine | Admitting: Internal Medicine

## 2021-07-14 DIAGNOSIS — I10 Essential (primary) hypertension: Secondary | ICD-10-CM | POA: Diagnosis not present

## 2021-07-14 DIAGNOSIS — Z170 Estrogen receptor positive status [ER+]: Secondary | ICD-10-CM

## 2021-07-14 DIAGNOSIS — Z79899 Other long term (current) drug therapy: Secondary | ICD-10-CM | POA: Insufficient documentation

## 2021-07-14 DIAGNOSIS — Z9013 Acquired absence of bilateral breasts and nipples: Secondary | ICD-10-CM | POA: Insufficient documentation

## 2021-07-14 DIAGNOSIS — Z5111 Encounter for antineoplastic chemotherapy: Secondary | ICD-10-CM | POA: Diagnosis not present

## 2021-07-14 DIAGNOSIS — Z923 Personal history of irradiation: Secondary | ICD-10-CM | POA: Insufficient documentation

## 2021-07-14 DIAGNOSIS — C50511 Malignant neoplasm of lower-outer quadrant of right female breast: Secondary | ICD-10-CM | POA: Insufficient documentation

## 2021-07-14 DIAGNOSIS — E119 Type 2 diabetes mellitus without complications: Secondary | ICD-10-CM | POA: Insufficient documentation

## 2021-07-14 DIAGNOSIS — Z794 Long term (current) use of insulin: Secondary | ICD-10-CM | POA: Insufficient documentation

## 2021-07-14 LAB — COMPREHENSIVE METABOLIC PANEL
ALT: 30 U/L (ref 0–44)
AST: 41 U/L (ref 15–41)
Albumin: 3.8 g/dL (ref 3.5–5.0)
Alkaline Phosphatase: 98 U/L (ref 38–126)
Anion gap: 8 (ref 5–15)
BUN: 20 mg/dL (ref 8–23)
CO2: 23 mmol/L (ref 22–32)
Calcium: 9.1 mg/dL (ref 8.9–10.3)
Chloride: 105 mmol/L (ref 98–111)
Creatinine, Ser: 1.1 mg/dL — ABNORMAL HIGH (ref 0.44–1.00)
GFR, Estimated: 55 mL/min — ABNORMAL LOW (ref >60)
Glucose, Bld: 87 mg/dL (ref 70–99)
Potassium: 3.7 mmol/L (ref 3.5–5.1)
Sodium: 136 mmol/L (ref 135–145)
Total Bilirubin: 0.5 mg/dL (ref 0.3–1.2)
Total Protein: 7.3 g/dL (ref 6.5–8.1)

## 2021-07-14 LAB — CBC WITH DIFFERENTIAL/PLATELET
Abs Immature Granulocytes: 0 10*3/uL (ref 0.00–0.07)
Basophils Absolute: 0 10*3/uL (ref 0.0–0.1)
Basophils Relative: 1 %
Eosinophils Absolute: 0.1 10*3/uL (ref 0.0–0.5)
Eosinophils Relative: 2 %
HCT: 34 % — ABNORMAL LOW (ref 36.0–46.0)
Hemoglobin: 11.2 g/dL — ABNORMAL LOW (ref 12.0–15.0)
Immature Granulocytes: 0 %
Lymphocytes Relative: 58 %
Lymphs Abs: 1.9 10*3/uL (ref 0.7–4.0)
MCH: 30.9 pg (ref 26.0–34.0)
MCHC: 32.9 g/dL (ref 30.0–36.0)
MCV: 93.9 fL (ref 80.0–100.0)
Monocytes Absolute: 0.2 10*3/uL (ref 0.1–1.0)
Monocytes Relative: 5 %
Neutro Abs: 1.1 10*3/uL — ABNORMAL LOW (ref 1.7–7.7)
Neutrophils Relative %: 34 %
Platelets: 229 10*3/uL (ref 150–400)
RBC: 3.62 MIL/uL — ABNORMAL LOW (ref 3.87–5.11)
RDW: 14.6 % (ref 11.5–15.5)
WBC: 3.3 10*3/uL — ABNORMAL LOW (ref 4.0–10.5)
nRBC: 0 % (ref 0.0–0.2)

## 2021-07-14 MED ORDER — FULVESTRANT 250 MG/5ML IM SOSY
500.0000 mg | PREFILLED_SYRINGE | Freq: Once | INTRAMUSCULAR | Status: AC
  Administered 2021-07-14: 500 mg via INTRAMUSCULAR
  Filled 2021-07-14: qty 10

## 2021-07-14 NOTE — Assessment & Plan Note (Addendum)
#  Recurrent breast cancer-ER positive PR negative ? HER-2/neu-currently on Faslodex plus abema. SEP 14th, 2022- STABLE- without evidence of local recurrence or metastatic disease in the chest, abdomen or pelvis. Stable treatment changes anteriorly in the right upper lobe and in the right axilla.  # Continue Faslodex; Currently on STARTED Abema 100 BID [held last 2 months because of neutropenia/UTI] Labs today-WBC- 3.5;ANC1.1 Hb 12; platlets-N- STABLE. Continue with abema at this time.   # Poorly controlled BG- 434- awaiting endocrinology evaluation in aiugust; with diet and medications.   # recurrent UTI/ ? Suprapubic tenderness- S/p eval with urology; resolved.  Monitor closely on Abema/with neutropenia  #DISPOSITION:early AM appt # Faslodex/today  # Follow up in 4 weeksMD/ labs [CBC/CMP/Ca 27-29] Faslodex-Dr.B

## 2021-07-14 NOTE — Progress Notes (Signed)
.[place] OFFICE PROGRESS NOTE  Patient Care Team: Ezequiel Kayser, MD (Inactive) as PCP - General (Internal Medicine) Neldon Mc, MD (General Surgery) Everlene Farrier, MD (Obstetrics and Gynecology) Noreene Filbert, MD Forest Gleason, MD (Unknown Physician Specialty) [name], MD as Consulting Physician (Hematology and Oncology)  Cancer Staging Carcinoma of lower-outer quadrant of right breast in female, estrogen receptor positive [facility]) Staging form: Breast, AJCC 7th Edition - Pathologic: ypT1c,ypN2a, MX - Signed by Haywood Lasso, MD on 03/10/2012 Cancer stage: ypT1c,ypN2a, MX    Oncology History Overview Note  # DEC 2012- RIGHT BREAST CA T2 N2 M0 tumor from biopsy.  Estrogen receptor positive, Progesterone receptor positive.  Current receptor negative by FISH 2. Neoadjuvant chemotherapy started in December of 28 with Cytoxan Adriamycin 3. Started on Taxol weekly chemotherapy. 4. Patient finished 12  cycles of Taxol chemotherapy in May of 2013.     5. Status post right modified radical mastectomy [Dr.Bowers; [place]] June of 2013, ypT1c  yp N2  MO. started also on Lerazole    7. Radiation therapy to the right breast (September of 2013).  Lymph node was positive for HER-2 receptor gene amplification of 2.22.  Will proceed with Herceptin treatment starting in September of 2013.   8.Patient has finished Herceptin (maintenance therapy) in August of 2014 8. Start patient on letrozole from November, 2013. 9. Patient started on Herceptin in September 2013.    10Patient finished 12 months of Herceptin therapy on August, 2014  # 6. Status post left side prophylactic mastectomy.  #Late MAY 2018-RECURRENCE BREAST CA- ER positive/PR negative; ?? HER-2/neu- [biopsy- proven-mediastinal lymph node; in suff for her 2 testing].  [elevated Tumor marker- CT/PET- uptake in Right Mediastinal LN; Sternum [June 2018 EBUS- Dr.Kasa]   # March 10 2017- faslodex + Abema;  OCT 5th CT-PR of mediastinal LN [consent]  #August 2020-left chest wall nodule biopsy benign  #August 2019- DVT-right upper extremity; Eliquis; stop end of March 2021 [repeat ultrasound negative/patient preference]; # JAN 2022-Cirrhosis- ? On CT scan-FEB 2022MRI liver- Negative fro overt cirrhosis --------------------------------------------------------------- -    DIAGNOSIS: [ BREAST CANCER- ER/PR/HER2 NEU POS  STAGE:  4   ;GOALS: PALLIATIVE  CURRENT/MOST RECENT THERAPY - ABEMA + FASLODEX    Carcinoma of lower-outer quadrant of right breast in female, estrogen receptor positive ([place])   INTERVAL HISTORY: Alone.  Ambulating independently.  Kimberly [name] 65 y.o.  female pleasant patient above history of metastatic ER PR positive HER-2/neu positive breast cancer currently on abema+ Faslodex is here for follow-up.   Abema was restarted 1 month ago after being held for 2 months because of neutropenia/recurrent infections bladder.  Patient denies any UTI symptoms at this time. Unfortunately patient is currently awaiting to see endocrinology.  She does not check her blood sugars frequently.  No fever no chills.  No nausea vomiting.  No headaches.   Review of Systems  Constitutional:  Positive for malaise/fatigue. Negative for chills, diaphoresis, fever and weight loss.  HENT:  Negative for nosebleeds and sore throat.   Eyes:  Negative for double vision.  Respiratory:  Negative for cough, hemoptysis, sputum production, shortness of breath and wheezing.   Cardiovascular:  Negative for chest pain, palpitations, orthopnea and leg swelling.  Gastrointestinal:  Negative for abdominal pain, blood in stool, constipation, heartburn, melena, nausea and vomiting.  Musculoskeletal:  Positive for back pain and joint pain.  Neurological:  Negative for dizziness, tingling, focal weakness, weakness and headaches.  Endo/Heme/Allergies:  Does not bruise/bleed  easily.  Psychiatric/Behavioral:   Negative for depression. The patient is not nervous/anxious and does not have insomnia.      PAST MEDICAL HISTORY :  Past Medical History:  Diagnosis Date   Arthritis    Cancer of lower-outer quadrant of female breast ([place]) 08/06/2011   RIGHT, chemo and bilateral mastectomies.   High cholesterol    History of kidney stones    Hx of bilateral breast implants    Hypertension    Lung metastases ([place]) 20118   chemo pills and faslidex shots.   PONV (postoperative nausea and vomiting)    Type II diabetes mellitus ([place])    fasting 140-150   Vertigo    LAST WEEK   Vertigo 01/2017    PAST SURGICAL HISTORY :   Past Surgical History:  Procedure Laterality Date   BREAST BIOPSY  07/2011, 2020   right   BREAST RECONSTRUCTION  03/07/2012   Procedure: BREAST RECONSTRUCTION;  Surgeon: Crissie Reese, MD;  Location: [place];  Service: Plastics;  Laterality: Left;  BREAST RECONSTRUCTION WITH PLACEMENT OF TISSUE EXPANDER TO LEFT BREAST   BREAST RECONSTRUCTION  02/06/2013   CESAREAN SECTION  [id number]   ENDOBRONCHIAL ULTRASOUND N/A 02/24/2017   Procedure: ENDOBRONCHIAL ULTRASOUND;  Surgeon: [name], MD;  Location: [facility];  Service: Cardiopulmonary;  Laterality: N/A;   LATISSIMUS FLAP TO BREAST Right 02/06/2013   Procedure: LATISSIMUS FLAP TO RIGHT BREAST WITH IMPLANT;  Surgeon: Crissie Reese, MD;  Location: [place];  Service: Plastics;  Laterality: Right;   MASTECTOMY  03/07/12   modified right; total left   MODIFIED MASTECTOMY  03/07/2012   Procedure: MODIFIED MASTECTOMY;  Surgeon: Haywood Lasso, MD;  Location: [place];  Service: General;  Laterality: Right;   PORTACATH PLACEMENT  07/2011   SCAR REVISION  03/30/2012   Procedure: SCAR REVISION;  Surgeon: Haywood Lasso, MD;  Location: [place];  Service: General;  Laterality: Right;  CLOSURE OF MASTECTOMY INCISION   WISDOM TOOTH EXTRACTION      FAMILY HISTORY :   Family History  Problem Relation Age of Onset   Breast cancer  Mother    Diabetes Father     SOCIAL HISTORY:   Social History   Tobacco Use   Smoking status: Never   Smokeless tobacco: Never  Vaping Use   Vaping Use: Never used  Substance Use Topics   Alcohol use: No   Drug use: No    ALLERGIES:  is allergic to sulfa antibiotics.  MEDICATIONS:  Current Outpatient Medications  Medication Sig Dispense Refill   atorvastatin (LIPITOR) 20 MG tablet Take 1 tablet (20 mg total) by mouth daily. 90 tablet 3   clotrimazole-betamethasone (LOTRISONE) cream SMARTSIG:Topical Morning-Evening     fulvestrant (FASLODEX) 250 MG/5ML injection Inject into the muscle every 30 (thirty) days. One injection each buttock over 1-2 minutes. Warm prior to use.     glipiZIDE (GLUCOTROL XL) 10 MG 24 hr tablet Take 1 tablet (10 mg total) by mouth 2 (two) times daily. 180 tablet 3   insulin aspart (NOVOLOG FLEXPEN) 100 UNIT/ML FlexPen Inject 4 Units into the skin 3 (three) times daily with meals. 15 mL 0   Insulin Detemir (LEVEMIR) 100 UNIT/ML Pen Inject 20 Units into the skin at bedtime. 15 mL 0   levothyroxine (SYNTHROID) 25 MCG tablet Take 1 tablet (25 mcg total) by mouth daily before breakfast. 90 tablet 1   lisinopril (ZESTRIL) 10 MG tablet Take 10 mg by mouth daily.  VERZENIO 100 MG tablet TAKE 1 TABLET BY MOUTH TWICE DAILY. SWALLOW TABLETS WHOLE; DO NOT CHEW, CRUSH, OR SPLIT TABLETS BEFORE SWALLOWING. 56 tablet 0   loperamide (IMODIUM A-D) 2 MG tablet Take 2 mg by mouth 4 (four) times daily as needed for diarrhea or loose stools. (Patient not taking: No sig reported)     ondansetron (ZOFRAN-ODT) 4 MG disintegrating tablet Take 1 tablet (4 mg total) by mouth every 8 (eight) hours as needed for nausea or vomiting. (Patient not taking: No sig reported) 20 tablet 3   No current facility-administered medications for this visit.    PHYSICAL EXAMINATION: ECOG PERFORMANCE STATUS: 1 - Symptomatic but completely ambulatory  BP 122/66   Pulse 91   Temp 98 F (36.7 C)    Resp 16   Wt 171 lb 12.8 oz (77.9 kg)   BMI 30.92 kg/m   Filed Weights   07/14/21 0840  Weight: 171 lb 12.8 oz (77.9 kg)    Physical Exam Constitutional:      Comments: She is alone.   HENT:     Head: Normocephalic and atraumatic.     Mouth/Throat:     Pharynx: No oropharyngeal exudate.  Eyes:     Pupils: Pupils are equal, round, and reactive to light.  Cardiovascular:     Rate and Rhythm: Normal rate and regular rhythm.  Pulmonary:     Effort: Pulmonary effort is normal. No respiratory distress.     Breath sounds: Normal breath sounds. No wheezing.  Abdominal:     General: Bowel sounds are normal. There is no distension.     Palpations: Abdomen is soft. There is no mass.     Tenderness: There is no abdominal tenderness. There is no guarding or rebound.  Musculoskeletal:        General: No tenderness. Normal range of motion.     Cervical back: Normal range of motion and neck supple.     Comments: Approximately 1 cm hard nodule felt in the anterior chest wall/left parasternal.  Skin:    General: Skin is warm.  Neurological:     Mental Status: She is alert and oriented to person, place, and time.  Psychiatric:        Mood and Affect: Affect normal.    LABORATORY DATA:  I have reviewed the data as listed    Component Value Date/Time   NA 136 07/14/2021 0813   NA 135 11/28/2014 1401   K 3.7 07/14/2021 0813   K 4.7 11/28/2014 1401   CL 105 07/14/2021 0813   CL 99 (L) 11/28/2014 1401   CO2 23 07/14/2021 0813   CO2 28 11/28/2014 1401   GLUCOSE 87 07/14/2021 0813   GLUCOSE 215 (H) 11/28/2014 1401   BUN 20 07/14/2021 0813   BUN 16 11/28/2014 1401   CREATININE 1.10 (H) 07/14/2021 0813   CREATININE 0.96 11/28/2014 1401   CALCIUM 9.1 07/14/2021 0813   CALCIUM 9.9 11/28/2014 1401   PROT 7.3 07/14/2021 0813   PROT 7.6 11/28/2014 1401   ALBUMIN 3.8 07/14/2021 0813   ALBUMIN 4.5 11/28/2014 1401   AST 41 07/14/2021 0813   AST 26 11/28/2014 1401   ALT 30 07/14/2021  0813   ALT 28 11/28/2014 1401   ALKPHOS 98 07/14/2021 0813   ALKPHOS 78 11/28/2014 1401   BILITOT 0.5 07/14/2021 0813   BILITOT 0.5 11/28/2014 1401   GFRNONAA 55 (L) 07/14/2021 0813   GFRNONAA >60 11/28/2014 1401   GFRAA >60 05/30/2020 1022  GFRAA >60 11/28/2014 1401    No results found for: SPEP, UPEP  Lab Results  Component Value Date   WBC 3.3 (L) 07/14/2021   NEUTROABS 1.1 (L) 07/14/2021   HGB 11.2 (L) 07/14/2021   HCT 34.0 (L) 07/14/2021   MCV 93.9 07/14/2021   PLT 229 07/14/2021      Chemistry      Component Value Date/Time   NA 136 07/14/2021 0813   NA 135 11/28/2014 1401   K 3.7 07/14/2021 0813   K 4.7 11/28/2014 1401   CL 105 07/14/2021 0813   CL 99 (L) 11/28/2014 1401   CO2 23 07/14/2021 0813   CO2 28 11/28/2014 1401   BUN 20 07/14/2021 0813   BUN 16 11/28/2014 1401   CREATININE 1.10 (H) 07/14/2021 0813   CREATININE 0.96 11/28/2014 1401      Component Value Date/Time   CALCIUM 9.1 07/14/2021 0813   CALCIUM 9.9 11/28/2014 1401   ALKPHOS 98 07/14/2021 0813   ALKPHOS 78 11/28/2014 1401   AST 41 07/14/2021 0813   AST 26 11/28/2014 1401   ALT 30 07/14/2021 0813   ALT 28 11/28/2014 1401   BILITOT 0.5 07/14/2021 0813   BILITOT 0.5 11/28/2014 1401       RADIOGRAPHIC STUDIES: I have personally reviewed the radiological images as listed and agreed with the findings in the report. No results found.   ASSESSMENT & PLAN:  Carcinoma of lower-outer quadrant of right breast in female, estrogen receptor positive ([place])  # Recurrent breast cancer-ER positive PR negative ? HER-2/neu-currently on Faslodex plus abema. SEP 14th, 2022- Stable examinations without evidence of local recurrence or metastatic disease in the chest, abdomen or pelvis. Stable treatment changes anteriorly in the right upper lobe and in the right axilla.    # Continue Faslodex; RE-START Abema 100 BID [held last 1 months because of neutropenia] Labs today-WBC- 3.5; Hb 12; platlets-N- STABLE.     # Poorly controlled BG- 434- awaiting endocrinology evaluation in aiugust; with diet and medications.   # recurrent UTI/ ? Suprapubic tenderness- S/p eval with urology; resolved.  Restart Abema.   #DISPOSITION:early AM appt # Flu shot today # Faslodex/today  # Follow up in 4 weeksMD/ labs [CBC/CMP/Ca 27-29] Faslodex-Dr.B      No orders of the defined types were placed in this encounter.    All questions were answered. The patient knows to call the clinic with any problems, questions or concerns.      [name], MD 07/14/2021 9:18 AM

## 2021-07-14 NOTE — Progress Notes (Signed)
Patient denies new problems/concerns today.   °

## 2021-07-15 ENCOUNTER — Other Ambulatory Visit (HOSPITAL_COMMUNITY): Payer: Self-pay

## 2021-07-15 LAB — CANCER ANTIGEN 27.29: CA 27.29: 27.3 U/mL (ref 0.0–38.6)

## 2021-08-11 ENCOUNTER — Encounter: Payer: Self-pay | Admitting: Nurse Practitioner

## 2021-08-11 ENCOUNTER — Inpatient Hospital Stay: Payer: PPO | Attending: Internal Medicine

## 2021-08-11 ENCOUNTER — Inpatient Hospital Stay: Payer: PPO | Admitting: Nurse Practitioner

## 2021-08-11 ENCOUNTER — Other Ambulatory Visit: Payer: Self-pay

## 2021-08-11 ENCOUNTER — Inpatient Hospital Stay: Payer: PPO

## 2021-08-11 VITALS — BP 124/66 | HR 96 | Temp 97.8°F | Resp 20 | Wt 167.9 lb

## 2021-08-11 DIAGNOSIS — Z170 Estrogen receptor positive status [ER+]: Secondary | ICD-10-CM | POA: Diagnosis not present

## 2021-08-11 DIAGNOSIS — Z923 Personal history of irradiation: Secondary | ICD-10-CM | POA: Diagnosis not present

## 2021-08-11 DIAGNOSIS — I10 Essential (primary) hypertension: Secondary | ICD-10-CM | POA: Diagnosis not present

## 2021-08-11 DIAGNOSIS — C50511 Malignant neoplasm of lower-outer quadrant of right female breast: Secondary | ICD-10-CM | POA: Insufficient documentation

## 2021-08-11 DIAGNOSIS — E119 Type 2 diabetes mellitus without complications: Secondary | ICD-10-CM | POA: Diagnosis not present

## 2021-08-11 DIAGNOSIS — Z79899 Other long term (current) drug therapy: Secondary | ICD-10-CM | POA: Diagnosis not present

## 2021-08-11 DIAGNOSIS — Z9013 Acquired absence of bilateral breasts and nipples: Secondary | ICD-10-CM | POA: Diagnosis not present

## 2021-08-11 DIAGNOSIS — Z5111 Encounter for antineoplastic chemotherapy: Secondary | ICD-10-CM | POA: Diagnosis not present

## 2021-08-11 DIAGNOSIS — Z794 Long term (current) use of insulin: Secondary | ICD-10-CM | POA: Diagnosis not present

## 2021-08-11 LAB — CBC WITH DIFFERENTIAL/PLATELET
Abs Immature Granulocytes: 0.01 10*3/uL (ref 0.00–0.07)
Basophils Absolute: 0.1 10*3/uL (ref 0.0–0.1)
Basophils Relative: 2 %
Eosinophils Absolute: 0.1 10*3/uL (ref 0.0–0.5)
Eosinophils Relative: 2 %
HCT: 34 % — ABNORMAL LOW (ref 36.0–46.0)
Hemoglobin: 11.8 g/dL — ABNORMAL LOW (ref 12.0–15.0)
Immature Granulocytes: 0 %
Lymphocytes Relative: 57 %
Lymphs Abs: 2.6 10*3/uL (ref 0.7–4.0)
MCH: 32.2 pg (ref 26.0–34.0)
MCHC: 34.7 g/dL (ref 30.0–36.0)
MCV: 92.6 fL (ref 80.0–100.0)
Monocytes Absolute: 0.3 10*3/uL (ref 0.1–1.0)
Monocytes Relative: 6 %
Neutro Abs: 1.5 10*3/uL — ABNORMAL LOW (ref 1.7–7.7)
Neutrophils Relative %: 33 %
Platelets: 217 10*3/uL (ref 150–400)
RBC: 3.67 MIL/uL — ABNORMAL LOW (ref 3.87–5.11)
RDW: 15.9 % — ABNORMAL HIGH (ref 11.5–15.5)
WBC: 4.5 10*3/uL (ref 4.0–10.5)
nRBC: 0 % (ref 0.0–0.2)

## 2021-08-11 LAB — COMPREHENSIVE METABOLIC PANEL
ALT: 30 U/L (ref 0–44)
AST: 38 U/L (ref 15–41)
Albumin: 3.9 g/dL (ref 3.5–5.0)
Alkaline Phosphatase: 82 U/L (ref 38–126)
Anion gap: 11 (ref 5–15)
BUN: 16 mg/dL (ref 8–23)
CO2: 23 mmol/L (ref 22–32)
Calcium: 9.7 mg/dL (ref 8.9–10.3)
Chloride: 102 mmol/L (ref 98–111)
Creatinine, Ser: 1.08 mg/dL — ABNORMAL HIGH (ref 0.44–1.00)
GFR, Estimated: 57 mL/min — ABNORMAL LOW (ref >60)
Glucose, Bld: 83 mg/dL (ref 70–99)
Potassium: 3.6 mmol/L (ref 3.5–5.1)
Sodium: 136 mmol/L (ref 135–145)
Total Bilirubin: 0.3 mg/dL (ref 0.3–1.2)
Total Protein: 7.4 g/dL (ref 6.5–8.1)

## 2021-08-11 MED ORDER — FULVESTRANT 250 MG/5ML IM SOSY
500.0000 mg | PREFILLED_SYRINGE | Freq: Once | INTRAMUSCULAR | Status: AC
  Administered 2021-08-11: 500 mg via INTRAMUSCULAR
  Filled 2021-08-11: qty 10

## 2021-08-11 NOTE — Progress Notes (Signed)
[name] OFFICE PROGRESS NOTE  Patient Care Team: Ezequiel Kayser, MD (Inactive) as PCP - General (Internal Medicine) Neldon Mc, MD (General Surgery) Everlene Farrier, MD (Obstetrics and Gynecology) Noreene Filbert, MD Forest Gleason, MD (Unknown Physician Specialty) [name], MD as Consulting Physician (Hematology and Oncology)   Cancer Staging  Carcinoma of lower-outer quadrant of right breast in female, estrogen receptor positive [facility]) Staging form: Breast, AJCC 7th Edition - Pathologic: ypT1c,ypN2a, MX - Signed by Haywood Lasso, MD on 03/10/2012 Cancer stage: ypT1c,ypN2a, MX   Oncology History Overview Note  # DEC 2012- RIGHT BREAST CA T2 N2 M0 tumor from biopsy.  Estrogen receptor positive, Progesterone receptor positive.  Current receptor negative by FISH 2. Neoadjuvant chemotherapy started in December of 28 with Cytoxan Adriamycin 3. Started on Taxol weekly chemotherapy. 4. Patient finished 12  cycles of Taxol chemotherapy in May of 2013.     5. Status post right modified radical mastectomy [Dr.Bowers; [place]] June of 2013, ypT1c  yp N2  MO. started also on Lerazole    7. Radiation therapy to the right breast (September of 2013).  Lymph node was positive for HER-2 receptor gene amplification of 2.22.  Will proceed with Herceptin treatment starting in September of 2013.   8.Patient has finished Herceptin (maintenance therapy) in August of 2014 8. Start patient on letrozole from November, 2013. 9. Patient started on Herceptin in September 2013.    10Patient finished 12 months of Herceptin therapy on August, 2014  # 6. Status post left side prophylactic mastectomy.  #Late MAY 2018-RECURRENCE BREAST CA- ER positive/PR negative; ?? HER-2/neu- [biopsy- proven-mediastinal lymph node; in suff for her 2 testing].  [elevated Tumor marker- CT/PET- uptake in Right Mediastinal LN; Sternum [June 2018 EBUS- Dr.Kasa]   # March 10 2017- faslodex + Abema;  OCT 5th CT-PR of mediastinal LN [consent]  #August 2020-left chest wall nodule biopsy benign  #August 2019- DVT-right upper extremity; Eliquis; stop end of March 2021 [repeat ultrasound negative/patient preference]; # JAN 2022-Cirrhosis- ? On CT scan-FEB 2022MRI liver- Negative fro overt cirrhosis --------------------------------------------------------------- -    DIAGNOSIS: [ BREAST CANCER- ER/PR/HER2 NEU POS  STAGE:  4   ;GOALS: PALLIATIVE  CURRENT/MOST RECENT THERAPY - ABEMA + FASLODEX    Carcinoma of lower-outer quadrant of right breast in female, estrogen receptor positive ([place])   INTERVAL HISTORY: Alone.  Ambulating independently.  Kimberly [name] 65 y.o.  female pleasant patient above history of metastatic ER PR positive HER-2/neu positive breast cancer currently on abema + Faslodex is here for follow-up and consideration of faslodex.   Denies fever, chills, nausea, vomiting, headaches. Feels well and denies specific complaints. She has chronic back and joint pain. Fatigue is stable an dunchanged.    Review of Systems  Constitutional:  Positive for malaise/fatigue. Negative for chills, diaphoresis, fever and weight loss.  HENT:  Negative for nosebleeds and sore throat.   Eyes:  Negative for double vision.  Respiratory:  Negative for cough, hemoptysis, sputum production, shortness of breath and wheezing.   Cardiovascular:  Negative for chest pain, palpitations, orthopnea and leg swelling.  Gastrointestinal:  Negative for abdominal pain, blood in stool, constipation, heartburn, melena, nausea and vomiting.  Musculoskeletal:  Positive for back pain and joint pain.  Neurological:  Negative for dizziness, tingling, focal weakness, weakness and headaches.  Endo/Heme/Allergies:  Does not bruise/bleed easily.  Psychiatric/Behavioral:  Negative for depression. The patient is not nervous/anxious and does not have insomnia.      PAST MEDICAL  HISTORY :  Past Medical History:   Diagnosis Date   Arthritis    Cancer of lower-outer quadrant of female breast ([place]) 08/06/2011   RIGHT, chemo and bilateral mastectomies.   High cholesterol    History of kidney stones    Hx of bilateral breast implants    Hypertension    Lung metastases ([place]) 20118   chemo pills and faslidex shots.   PONV (postoperative nausea and vomiting)    Type II diabetes mellitus ([place])    fasting 140-150   Vertigo    LAST WEEK   Vertigo 01/2017    PAST SURGICAL HISTORY :   Past Surgical History:  Procedure Laterality Date   BREAST BIOPSY  07/2011, 2020   right   BREAST RECONSTRUCTION  03/07/2012   Procedure: BREAST RECONSTRUCTION;  Surgeon: Crissie Reese, MD;  Location: [place];  Service: Plastics;  Laterality: Left;  BREAST RECONSTRUCTION WITH PLACEMENT OF TISSUE EXPANDER TO LEFT BREAST   BREAST RECONSTRUCTION  02/06/2013   CESAREAN SECTION  [id number]   ENDOBRONCHIAL ULTRASOUND N/A 02/24/2017   Procedure: ENDOBRONCHIAL ULTRASOUND;  Surgeon: [name], MD;  Location: [facility];  Service: Cardiopulmonary;  Laterality: N/A;   LATISSIMUS FLAP TO BREAST Right 02/06/2013   Procedure: LATISSIMUS FLAP TO RIGHT BREAST WITH IMPLANT;  Surgeon: Crissie Reese, MD;  Location: [place];  Service: Plastics;  Laterality: Right;   MASTECTOMY  03/07/12   modified right; total left   MODIFIED MASTECTOMY  03/07/2012   Procedure: MODIFIED MASTECTOMY;  Surgeon: Haywood Lasso, MD;  Location: [place];  Service: General;  Laterality: Right;   PORTACATH PLACEMENT  07/2011   SCAR REVISION  03/30/2012   Procedure: SCAR REVISION;  Surgeon: Haywood Lasso, MD;  Location: [place];  Service: General;  Laterality: Right;  CLOSURE OF MASTECTOMY INCISION   WISDOM TOOTH EXTRACTION      FAMILY HISTORY :   Family History  Problem Relation Age of Onset   Breast cancer Mother    Diabetes Father     SOCIAL HISTORY:   Social History   Tobacco Use   Smoking status: Never   Smokeless tobacco: Never   Vaping Use   Vaping Use: Never used  Substance Use Topics   Alcohol use: No   Drug use: No    ALLERGIES:  is allergic to sulfa antibiotics.  MEDICATIONS:  Current Outpatient Medications  Medication Sig Dispense Refill   atorvastatin (LIPITOR) 20 MG tablet Take 1 tablet (20 mg total) by mouth daily. 90 tablet 3   clotrimazole-betamethasone (LOTRISONE) cream SMARTSIG:Topical Morning-Evening     fulvestrant (FASLODEX) 250 MG/5ML injection Inject into the muscle every 30 (thirty) days. One injection each buttock over 1-2 minutes. Warm prior to use.     glipiZIDE (GLUCOTROL XL) 10 MG 24 hr tablet Take 1 tablet (10 mg total) by mouth 2 (two) times daily. 180 tablet 3   insulin aspart (NOVOLOG FLEXPEN) 100 UNIT/ML FlexPen Inject 4 Units into the skin 3 (three) times daily with meals. 15 mL 0   Insulin Detemir (LEVEMIR) 100 UNIT/ML Pen Inject 20 Units into the skin at bedtime. 15 mL 0   levothyroxine (SYNTHROID) 25 MCG tablet Take 1 tablet (25 mcg total) by mouth daily before breakfast. 90 tablet 1   lisinopril (ZESTRIL) 10 MG tablet Take 10 mg by mouth daily.     loperamide (IMODIUM A-D) 2 MG tablet Take 2 mg by mouth 4 (four) times daily as needed for diarrhea or loose stools.  ondansetron (ZOFRAN-ODT) 4 MG disintegrating tablet Take 1 tablet (4 mg total) by mouth every 8 (eight) hours as needed for nausea or vomiting. 20 tablet 3   VERZENIO 100 MG tablet TAKE 1 TABLET BY MOUTH TWICE DAILY. SWALLOW TABLETS WHOLE; DO NOT CHEW, CRUSH, OR SPLIT TABLETS BEFORE SWALLOWING. 56 tablet 0   No current facility-administered medications for this visit.    PHYSICAL EXAMINATION: ECOG PERFORMANCE STATUS: 1 - Symptomatic but completely ambulatory  BP 124/66   Pulse 96   Temp 97.8 F (36.6 C)   Resp 20   Wt 167 lb 14.4 oz (76.2 kg)   SpO2 100%   BMI 30.22 kg/m   Filed Weights   08/11/21 0824  Weight: 167 lb 14.4 oz (76.2 kg)    Physical Exam Constitutional:      Appearance: She is not  ill-appearing.  Eyes:     General: No scleral icterus.    Conjunctiva/sclera: Conjunctivae normal.  Cardiovascular:     Rate and Rhythm: Normal rate and regular rhythm.  Abdominal:     General: There is no distension.     Palpations: Abdomen is soft.     Tenderness: There is no abdominal tenderness. There is no guarding.  Musculoskeletal:        General: No deformity.     Right lower leg: No edema.     Left lower leg: No edema.  Lymphadenopathy:     Cervical: No cervical adenopathy.  Skin:    General: Skin is warm and dry.  Neurological:     Mental Status: She is alert and oriented to person, place, and time. Mental status is at baseline.  Psychiatric:        Mood and Affect: Mood normal.        Behavior: Behavior normal.    LABORATORY DATA:  I have reviewed the data as listed    Component Value Date/Time   NA 136 08/11/2021 0753   NA 135 11/28/2014 1401   K 3.6 08/11/2021 0753   K 4.7 11/28/2014 1401   CL 102 08/11/2021 0753   CL 99 (L) 11/28/2014 1401   CO2 23 08/11/2021 0753   CO2 28 11/28/2014 1401   GLUCOSE 83 08/11/2021 0753   GLUCOSE 215 (H) 11/28/2014 1401   BUN 16 08/11/2021 0753   BUN 16 11/28/2014 1401   CREATININE 1.08 (H) 08/11/2021 0753   CREATININE 0.96 11/28/2014 1401   CALCIUM 9.7 08/11/2021 0753   CALCIUM 9.9 11/28/2014 1401   PROT 7.4 08/11/2021 0753   PROT 7.6 11/28/2014 1401   ALBUMIN 3.9 08/11/2021 0753   ALBUMIN 4.5 11/28/2014 1401   AST 38 08/11/2021 0753   AST 26 11/28/2014 1401   ALT 30 08/11/2021 0753   ALT 28 11/28/2014 1401   ALKPHOS 82 08/11/2021 0753   ALKPHOS 78 11/28/2014 1401   BILITOT 0.3 08/11/2021 0753   BILITOT 0.5 11/28/2014 1401   GFRNONAA 57 (L) 08/11/2021 0753   GFRNONAA >60 11/28/2014 1401   GFRAA >60 05/30/2020 1022   GFRAA >60 11/28/2014 1401   No results found for: SPEP, UPEP  Lab Results  Component Value Date   WBC 4.5 08/11/2021   NEUTROABS 1.5 (L) 08/11/2021   HGB 11.8 (L) 08/11/2021   HCT 34.0 (L)  08/11/2021   MCV 92.6 08/11/2021   PLT 217 08/11/2021     Chemistry      Component Value Date/Time   NA 136 08/11/2021 0753   NA 135 11/28/2014 1401   K  3.6 08/11/2021 0753   K 4.7 11/28/2014 1401   CL 102 08/11/2021 0753   CL 99 (L) 11/28/2014 1401   CO2 23 08/11/2021 0753   CO2 28 11/28/2014 1401   BUN 16 08/11/2021 0753   BUN 16 11/28/2014 1401   CREATININE 1.08 (H) 08/11/2021 0753   CREATININE 0.96 11/28/2014 1401      Component Value Date/Time   CALCIUM 9.7 08/11/2021 0753   CALCIUM 9.9 11/28/2014 1401   ALKPHOS 82 08/11/2021 0753   ALKPHOS 78 11/28/2014 1401   AST 38 08/11/2021 0753   AST 26 11/28/2014 1401   ALT 30 08/11/2021 0753   ALT 28 11/28/2014 1401   BILITOT 0.3 08/11/2021 0753   BILITOT 0.5 11/28/2014 1401     RADIOGRAPHIC STUDIES: I have personally reviewed the radiological images as listed and agreed with the findings in the report. No results found.   ASSESSMENT & PLAN:  No problem-specific Assessment & Plan notes found for this encounter.  # Recurrent breast cancer-ER positive PR negative ? HER-2/neu-currently on Faslodex plus abema. SEP 14th, 2022- Stable examinations without evidence of local recurrence or metastatic disease in the chest, abdomen or pelvis. Stable treatment changes anteriorly in the right upper lobe and in the right axilla.    # Continue Faslodex; RE-START Abema 100 BID [held last 1 months because of neutropenia] Labs today-WBC- 4.5; Hb 11.8; platelets-217- STABLE.     # Blood sugars improved. Still awaiting endocrinology evaluation. Continue diet and medications.     # Recurrent UTI/? Suprapubic tenderness- S/p eval with urology; resolved.  Restart Abema.    DISPOSITION:early AM appt Faslodex/today  Follow up in 4 weeks, labs (cbc, cmp, ca27-29, Dr. Jacinto Reap, faslodex- [place]  No orders of the defined types were placed in this encounter.  All questions were answered. The patient knows to call the clinic with any problems, questions or  concerns.    Verlon Au, NP 08/11/2021

## 2021-08-12 LAB — CANCER ANTIGEN 27.29: CA 27.29: 29 U/mL (ref 0.0–38.6)

## 2021-08-14 ENCOUNTER — Other Ambulatory Visit: Payer: Self-pay | Admitting: Internal Medicine

## 2021-08-14 ENCOUNTER — Encounter: Payer: Self-pay | Admitting: Internal Medicine

## 2021-08-14 DIAGNOSIS — C50511 Malignant neoplasm of lower-outer quadrant of right female breast: Secondary | ICD-10-CM

## 2021-08-14 MED ORDER — ABEMACICLIB 100 MG PO TABS: 100.0000 mg | ORAL_TABLET | Freq: Two times a day (BID) | ORAL | 0 refills | Status: DC

## 2021-08-26 DIAGNOSIS — E039 Hypothyroidism, unspecified: Secondary | ICD-10-CM | POA: Diagnosis not present

## 2021-08-26 DIAGNOSIS — Z0000 Encounter for general adult medical examination without abnormal findings: Secondary | ICD-10-CM | POA: Diagnosis not present

## 2021-08-26 DIAGNOSIS — C50919 Malignant neoplasm of unspecified site of unspecified female breast: Secondary | ICD-10-CM | POA: Diagnosis not present

## 2021-08-26 DIAGNOSIS — Z8639 Personal history of other endocrine, nutritional and metabolic disease: Secondary | ICD-10-CM | POA: Diagnosis not present

## 2021-08-26 DIAGNOSIS — I700 Atherosclerosis of aorta: Secondary | ICD-10-CM | POA: Diagnosis not present

## 2021-08-26 DIAGNOSIS — K760 Fatty (change of) liver, not elsewhere classified: Secondary | ICD-10-CM | POA: Diagnosis not present

## 2021-08-26 DIAGNOSIS — E1169 Type 2 diabetes mellitus with other specified complication: Secondary | ICD-10-CM | POA: Diagnosis not present

## 2021-08-26 DIAGNOSIS — N182 Chronic kidney disease, stage 2 (mild): Secondary | ICD-10-CM | POA: Diagnosis not present

## 2021-08-26 DIAGNOSIS — Z23 Encounter for immunization: Secondary | ICD-10-CM | POA: Diagnosis not present

## 2021-08-26 DIAGNOSIS — C50511 Malignant neoplasm of lower-outer quadrant of right female breast: Secondary | ICD-10-CM | POA: Diagnosis not present

## 2021-08-26 DIAGNOSIS — M8589 Other specified disorders of bone density and structure, multiple sites: Secondary | ICD-10-CM | POA: Diagnosis not present

## 2021-08-26 DIAGNOSIS — M19041 Primary osteoarthritis, right hand: Secondary | ICD-10-CM | POA: Diagnosis not present

## 2021-08-26 DIAGNOSIS — E1122 Type 2 diabetes mellitus with diabetic chronic kidney disease: Secondary | ICD-10-CM | POA: Diagnosis not present

## 2021-08-26 DIAGNOSIS — E1159 Type 2 diabetes mellitus with other circulatory complications: Secondary | ICD-10-CM | POA: Diagnosis not present

## 2021-09-08 ENCOUNTER — Inpatient Hospital Stay: Payer: PPO | Admitting: Internal Medicine

## 2021-09-08 ENCOUNTER — Other Ambulatory Visit: Payer: Self-pay

## 2021-09-08 ENCOUNTER — Other Ambulatory Visit: Payer: Self-pay | Admitting: Nurse Practitioner

## 2021-09-08 ENCOUNTER — Inpatient Hospital Stay: Payer: PPO | Attending: Internal Medicine

## 2021-09-08 ENCOUNTER — Encounter: Payer: Self-pay | Admitting: Internal Medicine

## 2021-09-08 ENCOUNTER — Inpatient Hospital Stay: Payer: PPO

## 2021-09-08 DIAGNOSIS — Z923 Personal history of irradiation: Secondary | ICD-10-CM | POA: Insufficient documentation

## 2021-09-08 DIAGNOSIS — C50511 Malignant neoplasm of lower-outer quadrant of right female breast: Secondary | ICD-10-CM | POA: Diagnosis not present

## 2021-09-08 DIAGNOSIS — Z170 Estrogen receptor positive status [ER+]: Secondary | ICD-10-CM

## 2021-09-08 DIAGNOSIS — Z9011 Acquired absence of right breast and nipple: Secondary | ICD-10-CM | POA: Diagnosis not present

## 2021-09-08 DIAGNOSIS — Z5111 Encounter for antineoplastic chemotherapy: Secondary | ICD-10-CM | POA: Insufficient documentation

## 2021-09-08 DIAGNOSIS — Z79899 Other long term (current) drug therapy: Secondary | ICD-10-CM | POA: Insufficient documentation

## 2021-09-08 LAB — CBC WITH DIFFERENTIAL/PLATELET
Abs Immature Granulocytes: 0.01 10*3/uL (ref 0.00–0.07)
Basophils Absolute: 0.1 10*3/uL (ref 0.0–0.1)
Basophils Relative: 2 %
Eosinophils Absolute: 0 10*3/uL (ref 0.0–0.5)
Eosinophils Relative: 1 %
HCT: 32.5 % — ABNORMAL LOW (ref 36.0–46.0)
Hemoglobin: 11 g/dL — ABNORMAL LOW (ref 12.0–15.0)
Immature Granulocytes: 0 %
Lymphocytes Relative: 47 %
Lymphs Abs: 1.7 10*3/uL (ref 0.7–4.0)
MCH: 32.4 pg (ref 26.0–34.0)
MCHC: 33.8 g/dL (ref 30.0–36.0)
MCV: 95.9 fL (ref 80.0–100.0)
Monocytes Absolute: 0.2 10*3/uL (ref 0.1–1.0)
Monocytes Relative: 6 %
Neutro Abs: 1.6 10*3/uL — ABNORMAL LOW (ref 1.7–7.7)
Neutrophils Relative %: 44 %
Platelets: 264 10*3/uL (ref 150–400)
RBC: 3.39 MIL/uL — ABNORMAL LOW (ref 3.87–5.11)
RDW: 15.9 % — ABNORMAL HIGH (ref 11.5–15.5)
WBC: 3.6 10*3/uL — ABNORMAL LOW (ref 4.0–10.5)
nRBC: 0 % (ref 0.0–0.2)

## 2021-09-08 LAB — COMPREHENSIVE METABOLIC PANEL
ALT: 23 U/L (ref 0–44)
AST: 29 U/L (ref 15–41)
Albumin: 3.7 g/dL (ref 3.5–5.0)
Alkaline Phosphatase: 105 U/L (ref 38–126)
Anion gap: 6 (ref 5–15)
BUN: 20 mg/dL (ref 8–23)
CO2: 28 mmol/L (ref 22–32)
Calcium: 10.4 mg/dL — ABNORMAL HIGH (ref 8.9–10.3)
Chloride: 98 mmol/L (ref 98–111)
Creatinine, Ser: 1.06 mg/dL — ABNORMAL HIGH (ref 0.44–1.00)
GFR, Estimated: 58 mL/min — ABNORMAL LOW (ref >60)
Glucose, Bld: 200 mg/dL — ABNORMAL HIGH (ref 70–99)
Potassium: 3.7 mmol/L (ref 3.5–5.1)
Sodium: 132 mmol/L — ABNORMAL LOW (ref 135–145)
Total Bilirubin: 0.6 mg/dL (ref 0.3–1.2)
Total Protein: 7.3 g/dL (ref 6.5–8.1)

## 2021-09-08 MED ORDER — FULVESTRANT 250 MG/5ML IM SOSY
500.0000 mg | PREFILLED_SYRINGE | Freq: Once | INTRAMUSCULAR | Status: AC
  Administered 2021-09-08: 500 mg via INTRAMUSCULAR
  Filled 2021-09-08: qty 10

## 2021-09-08 NOTE — Progress Notes (Signed)
.[facility] OFFICE PROGRESS NOTE  Patient Care Team: Ezequiel Kayser, MD (Inactive) as PCP - General (Internal Medicine) Neldon Mc, MD (General Surgery) Everlene Farrier, MD (Obstetrics and Gynecology) Noreene Filbert, MD Forest Gleason, MD (Unknown Physician Specialty) [name], MD as Consulting Physician (Hematology and Oncology)   Cancer Staging  Carcinoma of lower-outer quadrant of right breast in female, estrogen receptor positive [facility]) Staging form: Breast, AJCC 7th Edition - Pathologic: ypT1c,ypN2a, MX - Signed by Haywood Lasso, MD on 03/10/2012 Cancer stage: ypT1c,ypN2a, MX    Oncology History Overview Note  # DEC 2012- RIGHT BREAST CA T2 N2 M0 tumor from biopsy.  Estrogen receptor positive, Progesterone receptor positive.  Current receptor negative by FISH 2. Neoadjuvant chemotherapy started in December of 28 with Cytoxan Adriamycin 3. Started on Taxol weekly chemotherapy. 4. Patient finished 12  cycles of Taxol chemotherapy in May of 2013.     5. Status post right modified radical mastectomy [Dr.Bowers; [place]] June of 2013, ypT1c  yp N2  MO. started also on Lerazole    7. Radiation therapy to the right breast (September of 2013).  Lymph node was positive for HER-2 receptor gene amplification of 2.22.  Will proceed with Herceptin treatment starting in September of 2013.   8.Patient has finished Herceptin (maintenance therapy) in August of 2014 8. Start patient on letrozole from November, 2013. 9. Patient started on Herceptin in September 2013.    10Patient finished 12 months of Herceptin therapy on August, 2014  # 6. Status post left side prophylactic mastectomy.  #Late MAY 2018-RECURRENCE BREAST CA- ER positive/PR negative; ?? HER-2/neu- [biopsy- proven-mediastinal lymph node; in suff for her 2 testing].  [elevated Tumor marker- CT/PET- uptake in Right Mediastinal LN; Sternum [June 2018 EBUS- Dr.Kasa]   # March 10 2017- faslodex +  Abema; OCT 5th CT-PR of mediastinal LN [consent]  #August 2020-left chest wall nodule biopsy benign  #August 2019- DVT-right upper extremity; Eliquis; stop end of March 2021 [repeat ultrasound negative/patient preference]; # JAN 2022-Cirrhosis- ? On CT scan-FEB 2022MRI liver- Negative fro overt cirrhosis --------------------------------------------------------------- -    DIAGNOSIS: [ BREAST CANCER- ER/PR/HER2 NEU POS  STAGE:  4   ;GOALS: PALLIATIVE  CURRENT/MOST RECENT THERAPY - ABEMA + FASLODEX    Carcinoma of lower-outer quadrant of right breast in female, estrogen receptor positive ([place])   INTERVAL HISTORY: Alone.  Ambulating independently.  Kimberly [name] 66 y.o.  female pleasant patient above history of metastatic ER PR positive HER-2/neu positive breast cancer currently on abema+ Faslodex is here for follow-up.   Patient denies any repeated bladder infections.  No fever no chills.   Patient states her blood sugars are better controlled at home. No nausea vomiting.  No headaches.  No new lumps or bumps.  No back pain.  Patient started taking calcium along with vitamin D as per PCP.  Review of Systems  Constitutional:  Positive for malaise/fatigue. Negative for chills, diaphoresis, fever and weight loss.  HENT:  Negative for nosebleeds and sore throat.   Eyes:  Negative for double vision.  Respiratory:  Negative for cough, hemoptysis, sputum production, shortness of breath and wheezing.   Cardiovascular:  Negative for chest pain, palpitations, orthopnea and leg swelling.  Gastrointestinal:  Negative for abdominal pain, blood in stool, constipation, heartburn, melena, nausea and vomiting.  Musculoskeletal:  Positive for back pain and joint pain.  Neurological:  Negative for dizziness, tingling, focal weakness, weakness and headaches.  Endo/Heme/Allergies:  Does not bruise/bleed easily.  Psychiatric/Behavioral:  Negative for depression. The patient is not nervous/anxious  and does not have insomnia.      PAST MEDICAL HISTORY :  Past Medical History:  Diagnosis Date   Arthritis    Cancer of lower-outer quadrant of female breast ([place]) 08/06/2011   RIGHT, chemo and bilateral mastectomies.   High cholesterol    History of kidney stones    Hx of bilateral breast implants    Hypertension    Lung metastases ([place]) 20118   chemo pills and faslidex shots.   PONV (postoperative nausea and vomiting)    Type II diabetes mellitus ([place])    fasting 140-150   Vertigo    LAST WEEK   Vertigo 01/2017    PAST SURGICAL HISTORY :   Past Surgical History:  Procedure Laterality Date   BREAST BIOPSY  07/2011, 2020   right   BREAST RECONSTRUCTION  03/07/2012   Procedure: BREAST RECONSTRUCTION;  Surgeon: Crissie Reese, MD;  Location: [facility];  Service: Plastics;  Laterality: Left;  BREAST RECONSTRUCTION WITH PLACEMENT OF TISSUE EXPANDER TO LEFT BREAST   BREAST RECONSTRUCTION  02/06/2013   CESAREAN SECTION  [id number]   ENDOBRONCHIAL ULTRASOUND N/A 02/24/2017   Procedure: ENDOBRONCHIAL ULTRASOUND;  Surgeon: [name], MD;  Location: [facility];  Service: Cardiopulmonary;  Laterality: N/A;   LATISSIMUS FLAP TO BREAST Right 02/06/2013   Procedure: LATISSIMUS FLAP TO RIGHT BREAST WITH IMPLANT;  Surgeon: Crissie Reese, MD;  Location: [facility];  Service: Plastics;  Laterality: Right;   MASTECTOMY  03/07/12   modified right; total left   MODIFIED MASTECTOMY  03/07/2012   Procedure: MODIFIED MASTECTOMY;  Surgeon: Haywood Lasso, MD;  Location: [place] [place];  Service: General;  Laterality: Right;   PORTACATH PLACEMENT  07/2011   SCAR REVISION  03/30/2012   Procedure: SCAR REVISION;  Surgeon: Haywood Lasso, MD;  Location: [facility];  Service: General;  Laterality: Right;  CLOSURE OF MASTECTOMY INCISION   WISDOM TOOTH EXTRACTION      FAMILY HISTORY :   Family History  Problem Relation Age of Onset   Breast cancer Mother    Diabetes Father     SOCIAL HISTORY:   Social  History   Tobacco Use   Smoking status: Never   Smokeless tobacco: Never  Vaping Use   Vaping Use: Never used  Substance Use Topics   Alcohol use: No   Drug use: No    ALLERGIES:  is allergic to sulfa antibiotics.  MEDICATIONS:  Current Outpatient Medications  Medication Sig Dispense Refill   abemaciclib (VERZENIO) 100 MG tablet Take 1 tablet (100 mg total) by mouth 2 (two) times daily. Swallow tablets whole. Do not chew, crush, or split tablets before swallowing. 56 tablet 0   atorvastatin (LIPITOR) 20 MG tablet Take 1 tablet (20 mg total) by mouth daily. 90 tablet 3   clotrimazole-betamethasone (LOTRISONE) cream SMARTSIG:Topical Morning-Evening     fulvestrant (FASLODEX) 250 MG/5ML injection Inject into the muscle every 30 (thirty) days. One injection each buttock over 1-2 minutes. Warm prior to use.     glipiZIDE (GLUCOTROL XL) 10 MG 24 hr tablet Take 1 tablet (10 mg total) by mouth 2 (two) times daily. 180 tablet 3   insulin aspart (NOVOLOG FLEXPEN) 100 UNIT/ML FlexPen Inject 4 Units into the skin 3 (three) times daily with meals. 15 mL 0   Insulin Detemir (LEVEMIR) 100 UNIT/ML Pen Inject 20 Units into the skin at bedtime. 15 mL 0   levothyroxine (SYNTHROID) 25 MCG tablet  Take 1 tablet (25 mcg total) by mouth daily before breakfast. 90 tablet 1   lisinopril (ZESTRIL) 10 MG tablet Take 10 mg by mouth daily.     loperamide (IMODIUM A-D) 2 MG tablet Take 2 mg by mouth 4 (four) times daily as needed for diarrhea or loose stools.     ondansetron (ZOFRAN-ODT) 4 MG disintegrating tablet Take 1 tablet (4 mg total) by mouth every 8 (eight) hours as needed for nausea or vomiting. 20 tablet 3   No current facility-administered medications for this visit.   Facility-Administered Medications Ordered in Other Visits  Medication Dose Route Frequency Provider Last Rate Last Admin   fulvestrant (FASLODEX) injection 500 mg  500 mg Intramuscular Once [name], MD        PHYSICAL  EXAMINATION: ECOG PERFORMANCE STATUS: 1 - Symptomatic but completely ambulatory  BP 119/67 (BP Location: Left Arm, Patient Position: Sitting, Cuff Size: Normal)    Pulse 94    Temp (!) 97.3 F (36.3 C) (Temporal)    Ht 5' 2.5" (1.588 m)    Wt 169 lb 3.2 oz (76.7 kg)    SpO2 99%    BMI 30.45 kg/m   Filed Weights   09/08/21 0838  Weight: 169 lb 3.2 oz (76.7 kg)    Physical Exam Constitutional:      Comments: She is alone.   HENT:     Head: Normocephalic and atraumatic.     Mouth/Throat:     Pharynx: No oropharyngeal exudate.  Eyes:     Pupils: Pupils are equal, round, and reactive to light.  Cardiovascular:     Rate and Rhythm: Normal rate and regular rhythm.  Pulmonary:     Effort: Pulmonary effort is normal. No respiratory distress.     Breath sounds: Normal breath sounds. No wheezing.  Abdominal:     General: Bowel sounds are normal. There is no distension.     Palpations: Abdomen is soft. There is no mass.     Tenderness: There is no abdominal tenderness. There is no guarding or rebound.  Musculoskeletal:        General: No tenderness. Normal range of motion.     Cervical back: Normal range of motion and neck supple.     Comments: Approximately 1 cm hard nodule felt in the anterior chest wall/left parasternal.  Skin:    General: Skin is warm.  Neurological:     Mental Status: She is alert and oriented to person, place, and time.  Psychiatric:        Mood and Affect: Affect normal.    LABORATORY DATA:  I have reviewed the data as listed    Component Value Date/Time   NA 132 (L) 09/08/2021 0814   NA 135 11/28/2014 1401   K 3.7 09/08/2021 0814   K 4.7 11/28/2014 1401   CL 98 09/08/2021 0814   CL 99 (L) 11/28/2014 1401   CO2 28 09/08/2021 0814   CO2 28 11/28/2014 1401   GLUCOSE 200 (H) 09/08/2021 0814   GLUCOSE 215 (H) 11/28/2014 1401   BUN 20 09/08/2021 0814   BUN 16 11/28/2014 1401   CREATININE 1.06 (H) 09/08/2021 0814   CREATININE 0.96 11/28/2014 1401    CALCIUM 10.4 (H) 09/08/2021 0814   CALCIUM 9.9 11/28/2014 1401   PROT 7.3 09/08/2021 0814   PROT 7.6 11/28/2014 1401   ALBUMIN 3.7 09/08/2021 0814   ALBUMIN 4.5 11/28/2014 1401   AST 29 09/08/2021 0814   AST 26 11/28/2014 1401  ALT 23 09/08/2021 0814   ALT 28 11/28/2014 1401   ALKPHOS 105 09/08/2021 0814   ALKPHOS 78 11/28/2014 1401   BILITOT 0.6 09/08/2021 0814   BILITOT 0.5 11/28/2014 1401   GFRNONAA 58 (L) 09/08/2021 0814   GFRNONAA >60 11/28/2014 1401   GFRAA >60 05/30/2020 1022   GFRAA >60 11/28/2014 1401    No results found for: SPEP, UPEP  Lab Results  Component Value Date   WBC 3.6 (L) 09/08/2021   NEUTROABS 1.6 (L) 09/08/2021   HGB 11.0 (L) 09/08/2021   HCT 32.5 (L) 09/08/2021   MCV 95.9 09/08/2021   PLT 264 09/08/2021      Chemistry      Component Value Date/Time   NA 132 (L) 09/08/2021 0814   NA 135 11/28/2014 1401   K 3.7 09/08/2021 0814   K 4.7 11/28/2014 1401   CL 98 09/08/2021 0814   CL 99 (L) 11/28/2014 1401   CO2 28 09/08/2021 0814   CO2 28 11/28/2014 1401   BUN 20 09/08/2021 0814   BUN 16 11/28/2014 1401   CREATININE 1.06 (H) 09/08/2021 0814   CREATININE 0.96 11/28/2014 1401      Component Value Date/Time   CALCIUM 10.4 (H) 09/08/2021 0814   CALCIUM 9.9 11/28/2014 1401   ALKPHOS 105 09/08/2021 0814   ALKPHOS 78 11/28/2014 1401   AST 29 09/08/2021 0814   AST 26 11/28/2014 1401   ALT 23 09/08/2021 0814   ALT 28 11/28/2014 1401   BILITOT 0.6 09/08/2021 0814   BILITOT 0.5 11/28/2014 1401       RADIOGRAPHIC STUDIES: I have personally reviewed the radiological images as listed and agreed with the findings in the report. No results found.   ASSESSMENT & PLAN:  Carcinoma of lower-outer quadrant of right breast in female, estrogen receptor positive ([place])  # Recurrent breast cancer-ER positive PR negative ? HER-2/neu-currently on Faslodex plus abema. SEP 14th, 2022- STABLE- without evidence of local recurrence or metastatic disease in the  chest, abdomen or pelvis. Stable- treatment changes anteriorly in the right upper lobe and in the right axilla.    # Continue Faslodex; Currently on  Abema 100 BID; Labs today-WBC- 3.6 ;ANC1.5 Hb 11platlets-N- STABLE. Continue with abema at this time.   # Poorly controlled BG- 200- s/p endocrinology evaluation-; with diet and medications.   # recurrent UTI/ ? Suprapubic tenderness- S/p eval with urology- STABLE.  Monitor closely on Abema/with neutropenia  # Hypercalcemia: Ca 10.4-? Etiology; just started calcium 600+ vit D [PCP]. STOP taking; repeat labs in 1 week  #DISPOSITION:early AM appt # 1 week- labs- bmp  # Faslodex/today  # Follow up in 4 weeksMD/ labs [CBC/CMP/Ca 27-29] Faslodex; CT [facility]-Dr.B       Orders Placed This Encounter  Procedures   CT CHEST ABDOMEN PELVIS W CONTRAST    Standing Status:   Future    Standing Expiration Date:   09/08/2022    Order Specific Question:   Preferred imaging location?    Answer:   [facility]    Order Specific Question:   Radiology Contrast Protocol - do NOT remove file path    Answer:   [email or web address]   Basic metabolic panel    Standing Status:   Future    Standing Expiration Date:   09/08/2022      All questions were answered. The patient knows to call the clinic with any problems, questions or concerns.      [name], MD 09/08/2021 9:04 AM

## 2021-09-08 NOTE — Patient Instructions (Signed)
STOP calcium plus vit D

## 2021-09-08 NOTE — Assessment & Plan Note (Addendum)
#  Recurrent breast cancer-ER positive PR negative ? HER-2/neu-currently on Faslodex plus abema. SEP 14th, 2022- STABLE- without evidence of local recurrence or metastatic disease in the chest, abdomen or pelvis. Stable- treatment changes anteriorly in the right upper lobe and in the right axilla.  # Continue Faslodex; Currently on  Abema 100 BID; Labs today-WBC- 3.6 ;ANC1.5 Hb 11platlets-N- STABLE. Continue with abema at this time.   # Poorly controlled BG- 200- s/p endocrinology evaluation-; with diet and medications.   # recurrent UTI/ ? Suprapubic tenderness- S/p eval with urology- STABLE.  Monitor closely on Abema/with neutropenia  # Hypercalcemia: Ca 10.4-? Etiology; just started calcium 600+ vit D [PCP]. STOP taking; repeat labs in 1 week  #DISPOSITION:early AM appt # 1 week- labs- bmp  # Faslodex/today  # Follow up in 4 weeksMD/ labs [CBC/CMP/Ca 27-29] Faslodex; CT [facility]-Dr.B

## 2021-09-09 ENCOUNTER — Other Ambulatory Visit: Payer: Self-pay | Admitting: Internal Medicine

## 2021-09-09 ENCOUNTER — Encounter: Payer: Self-pay | Admitting: Internal Medicine

## 2021-09-09 DIAGNOSIS — C50511 Malignant neoplasm of lower-outer quadrant of right female breast: Secondary | ICD-10-CM

## 2021-09-09 DIAGNOSIS — Z170 Estrogen receptor positive status [ER+]: Secondary | ICD-10-CM

## 2021-09-09 LAB — CANCER ANTIGEN 27.29: CA 27.29: 23.2 U/mL (ref 0.0–38.6)

## 2021-09-09 MED ORDER — ABEMACICLIB 100 MG PO TABS: 100.0000 mg | ORAL_TABLET | Freq: Two times a day (BID) | ORAL | 4 refills | Status: DC

## 2021-09-15 ENCOUNTER — Other Ambulatory Visit: Payer: Self-pay

## 2021-09-15 ENCOUNTER — Inpatient Hospital Stay: Payer: PPO

## 2021-09-15 DIAGNOSIS — C50511 Malignant neoplasm of lower-outer quadrant of right female breast: Secondary | ICD-10-CM

## 2021-09-15 DIAGNOSIS — Z5111 Encounter for antineoplastic chemotherapy: Secondary | ICD-10-CM | POA: Diagnosis not present

## 2021-09-15 LAB — BASIC METABOLIC PANEL
Anion gap: 8 (ref 5–15)
BUN: 17 mg/dL (ref 8–23)
CO2: 23 mmol/L (ref 22–32)
Calcium: 9.4 mg/dL (ref 8.9–10.3)
Chloride: 103 mmol/L (ref 98–111)
Creatinine, Ser: 0.89 mg/dL (ref 0.44–1.00)
GFR, Estimated: >60 mL/min (ref >60)
Glucose, Bld: 121 mg/dL — ABNORMAL HIGH (ref 70–99)
Potassium: 3.8 mmol/L (ref 3.5–5.1)
Sodium: 134 mmol/L — ABNORMAL LOW (ref 135–145)

## 2021-09-29 ENCOUNTER — Ambulatory Visit
Admission: RE | Admit: 2021-09-29 | Discharge: 2021-09-29 | Disposition: A | Discharge status: Home / Self Care | Payer: PPO | Source: Ambulatory Visit | Attending: Internal Medicine | Admitting: Internal Medicine

## 2021-09-29 ENCOUNTER — Other Ambulatory Visit: Payer: Self-pay

## 2021-09-29 DIAGNOSIS — C50511 Malignant neoplasm of lower-outer quadrant of right female breast: Secondary | ICD-10-CM | POA: Insufficient documentation

## 2021-09-29 DIAGNOSIS — Z170 Estrogen receptor positive status [ER+]: Secondary | ICD-10-CM | POA: Diagnosis present

## 2021-09-29 MED ORDER — IOHEXOL 300 MG/ML  SOLN
100.0000 mL | Freq: Once | INTRAMUSCULAR | Status: AC | PRN
  Administered 2021-09-29: 100 mL via INTRAVENOUS

## 2021-10-06 ENCOUNTER — Inpatient Hospital Stay: Payer: PPO | Attending: Internal Medicine

## 2021-10-06 ENCOUNTER — Inpatient Hospital Stay: Payer: PPO

## 2021-10-06 ENCOUNTER — Other Ambulatory Visit: Payer: Self-pay

## 2021-10-06 ENCOUNTER — Inpatient Hospital Stay: Payer: PPO | Admitting: Internal Medicine

## 2021-10-06 ENCOUNTER — Encounter: Payer: Self-pay | Admitting: Internal Medicine

## 2021-10-06 DIAGNOSIS — Z5111 Encounter for antineoplastic chemotherapy: Secondary | ICD-10-CM | POA: Insufficient documentation

## 2021-10-06 DIAGNOSIS — Z794 Long term (current) use of insulin: Secondary | ICD-10-CM | POA: Insufficient documentation

## 2021-10-06 DIAGNOSIS — Z170 Estrogen receptor positive status [ER+]: Secondary | ICD-10-CM | POA: Insufficient documentation

## 2021-10-06 DIAGNOSIS — Z86718 Personal history of other venous thrombosis and embolism: Secondary | ICD-10-CM | POA: Insufficient documentation

## 2021-10-06 DIAGNOSIS — C50511 Malignant neoplasm of lower-outer quadrant of right female breast: Secondary | ICD-10-CM | POA: Insufficient documentation

## 2021-10-06 DIAGNOSIS — Z9013 Acquired absence of bilateral breasts and nipples: Secondary | ICD-10-CM | POA: Insufficient documentation

## 2021-10-06 DIAGNOSIS — E119 Type 2 diabetes mellitus without complications: Secondary | ICD-10-CM | POA: Diagnosis not present

## 2021-10-06 DIAGNOSIS — Z79899 Other long term (current) drug therapy: Secondary | ICD-10-CM | POA: Insufficient documentation

## 2021-10-06 LAB — COMPREHENSIVE METABOLIC PANEL
ALT: 23 U/L (ref 0–44)
AST: 29 U/L (ref 15–41)
Albumin: 3.9 g/dL (ref 3.5–5.0)
Alkaline Phosphatase: 99 U/L (ref 38–126)
Anion gap: 8 (ref 5–15)
BUN: 18 mg/dL (ref 8–23)
CO2: 24 mmol/L (ref 22–32)
Calcium: 10.2 mg/dL (ref 8.9–10.3)
Chloride: 101 mmol/L (ref 98–111)
Creatinine, Ser: 0.92 mg/dL (ref 0.44–1.00)
GFR, Estimated: >60 mL/min (ref >60)
Glucose, Bld: 123 mg/dL — ABNORMAL HIGH (ref 70–99)
Potassium: 4.2 mmol/L (ref 3.5–5.1)
Sodium: 133 mmol/L — ABNORMAL LOW (ref 135–145)
Total Bilirubin: 0.4 mg/dL (ref 0.3–1.2)
Total Protein: 7.5 g/dL (ref 6.5–8.1)

## 2021-10-06 LAB — CBC WITH DIFFERENTIAL/PLATELET
Abs Immature Granulocytes: 0.02 10*3/uL (ref 0.00–0.07)
Basophils Absolute: 0.1 10*3/uL (ref 0.0–0.1)
Basophils Relative: 2 %
Eosinophils Absolute: 0.1 10*3/uL (ref 0.0–0.5)
Eosinophils Relative: 2 %
HCT: 35 % — ABNORMAL LOW (ref 36.0–46.0)
Hemoglobin: 11.9 g/dL — ABNORMAL LOW (ref 12.0–15.0)
Immature Granulocytes: 1 %
Lymphocytes Relative: 44 %
Lymphs Abs: 1.8 10*3/uL (ref 0.7–4.0)
MCH: 33.3 pg (ref 26.0–34.0)
MCHC: 34 g/dL (ref 30.0–36.0)
MCV: 98 fL (ref 80.0–100.0)
Monocytes Absolute: 0.3 10*3/uL (ref 0.1–1.0)
Monocytes Relative: 7 %
Neutro Abs: 1.7 10*3/uL (ref 1.7–7.7)
Neutrophils Relative %: 44 %
Platelets: 239 10*3/uL (ref 150–400)
RBC: 3.57 MIL/uL — ABNORMAL LOW (ref 3.87–5.11)
RDW: 13.6 % (ref 11.5–15.5)
WBC: 3.9 10*3/uL — ABNORMAL LOW (ref 4.0–10.5)
nRBC: 0 % (ref 0.0–0.2)

## 2021-10-06 MED ORDER — FULVESTRANT 250 MG/5ML IM SOSY
500.0000 mg | PREFILLED_SYRINGE | Freq: Once | INTRAMUSCULAR | Status: DC
  Filled 2021-10-06 (×2): qty 10

## 2021-10-06 NOTE — Progress Notes (Signed)
Faslodex has been received at [facility] and patient has been scheduled for injection only

## 2021-10-06 NOTE — Progress Notes (Signed)
.[place] OFFICE PROGRESS NOTE  Patient Care Team: Ezequiel Kayser, MD (Inactive) as PCP - General (Internal Medicine) Neldon Mc, MD (General Surgery) Everlene Farrier, MD (Obstetrics and Gynecology) Noreene Filbert, MD Forest Gleason, MD (Unknown Physician Specialty) [name], MD as Consulting Physician (Hematology and Oncology)   Cancer Staging  Carcinoma of lower-outer quadrant of right breast in female, estrogen receptor positive [facility]) Staging form: Breast, AJCC 7th Edition - Pathologic: ypT1c,ypN2a, MX - Signed by Haywood Lasso, MD on 03/10/2012 Cancer stage: ypT1c,ypN2a, MX    Oncology History Overview Note  # DEC 2012- RIGHT BREAST CA T2 N2 M0 tumor from biopsy.  Estrogen receptor positive, Progesterone receptor positive.  Current receptor negative by FISH 2. Neoadjuvant chemotherapy started in December of 28 with Cytoxan Adriamycin 3. Started on Taxol weekly chemotherapy. 4. Patient finished 12  cycles of Taxol chemotherapy in May of 2013.     5. Status post right modified radical mastectomy [Dr.Bowers; [place]] June of 2013, ypT1c  yp N2  MO. started also on Lerazole    7. Radiation therapy to the right breast (September of 2013).  Lymph node was positive for HER-2 receptor gene amplification of 2.22.  Will proceed with Herceptin treatment starting in September of 2013.   8.Patient has finished Herceptin (maintenance therapy) in August of 2014 8. Start patient on letrozole from November, 2013. 9. Patient started on Herceptin in September 2013.    10Patient finished 12 months of Herceptin therapy on August, 2014  # 6. Status post left side prophylactic mastectomy.  #Late MAY 2018-RECURRENCE BREAST CA- ER positive/PR negative; ?? HER-2/neu- [biopsy- proven-mediastinal lymph node; in suff for her 2 testing].  [elevated Tumor marker- CT/PET- uptake in Right Mediastinal LN; Sternum [June 2018 EBUS- Dr.Kasa]   # March 10 2017- faslodex +  Abema; OCT 5th CT-PR of mediastinal LN [consent]  #August 2020-left chest wall nodule biopsy benign  #August 2019- DVT-right upper extremity; Eliquis; stop end of March 2021 [repeat ultrasound negative/patient preference]; # JAN 2022-Cirrhosis- ? On CT scan-FEB 2022MRI liver- Negative fro overt cirrhosis --------------------------------------------------------------- -    DIAGNOSIS: [ BREAST CANCER- ER/PR/HER2 NEU POS  STAGE:  4   ;GOALS: PALLIATIVE  CURRENT/MOST RECENT THERAPY - ABEMA + FASLODEX    Carcinoma of lower-outer quadrant of right breast in female, estrogen receptor positive ([place])   INTERVAL HISTORY: Alone.  Ambulating independently.  Kimberly [name] 66 y.o.  female pleasant patient above history of metastatic ER PR positive HER-2/neu positive breast cancer currently on abema+ Faslodex is here for follow-up/review results of the CT scan.   Patient states her blood sugars are better controlled at home. No nausea vomiting.  No headaches.  No new lumps or bumps.  No back pain.  Patient stopped her calcium tablets.  She is currently taking only vitamin D.   Review of Systems  Constitutional:  Positive for malaise/fatigue. Negative for chills, diaphoresis, fever and weight loss.  HENT:  Negative for nosebleeds and sore throat.   Eyes:  Negative for double vision.  Respiratory:  Negative for cough, hemoptysis, sputum production, shortness of breath and wheezing.   Cardiovascular:  Negative for chest pain, palpitations, orthopnea and leg swelling.  Gastrointestinal:  Negative for abdominal pain, blood in stool, constipation, heartburn, melena, nausea and vomiting.  Musculoskeletal:  Positive for back pain and joint pain.  Neurological:  Negative for dizziness, tingling, focal weakness, weakness and headaches.  Endo/Heme/Allergies:  Does not bruise/bleed easily.  Psychiatric/Behavioral:  Negative for depression. The  patient is not nervous/anxious and does not have insomnia.       PAST MEDICAL HISTORY :  Past Medical History:  Diagnosis Date   Arthritis    Cancer of lower-outer quadrant of female breast ([place]) 08/06/2011   RIGHT, chemo and bilateral mastectomies.   High cholesterol    History of kidney stones    Hx of bilateral breast implants    Hypertension    Lung metastases ([place]) 20118   chemo pills and faslidex shots.   PONV (postoperative nausea and vomiting)    Type II diabetes mellitus ([place])    fasting 140-150   Vertigo    LAST WEEK   Vertigo 01/2017    PAST SURGICAL HISTORY :   Past Surgical History:  Procedure Laterality Date   BREAST BIOPSY  07/2011, 2020   right   BREAST RECONSTRUCTION  03/07/2012   Procedure: BREAST RECONSTRUCTION;  Surgeon: Crissie Reese, MD;  Location: [place];  Service: Plastics;  Laterality: Left;  BREAST RECONSTRUCTION WITH PLACEMENT OF TISSUE EXPANDER TO LEFT BREAST   BREAST RECONSTRUCTION  02/06/2013   CESAREAN SECTION  [id number],[id number]   ENDOBRONCHIAL ULTRASOUND N/A 02/24/2017   Procedure: ENDOBRONCHIAL ULTRASOUND;  Surgeon: [name], MD;  Location: [facility];  Service: Cardiopulmonary;  Laterality: N/A;   LATISSIMUS FLAP TO BREAST Right 02/06/2013   Procedure: LATISSIMUS FLAP TO RIGHT BREAST WITH IMPLANT;  Surgeon: Crissie Reese, MD;  Location: [place];  Service: Plastics;  Laterality: Right;   MASTECTOMY  03/07/12   modified right; total left   MODIFIED MASTECTOMY  03/07/2012   Procedure: MODIFIED MASTECTOMY;  Surgeon: Haywood Lasso, MD;  Location: [place];  Service: General;  Laterality: Right;   PORTACATH PLACEMENT  07/2011   SCAR REVISION  03/30/2012   Procedure: SCAR REVISION;  Surgeon: Haywood Lasso, MD;  Location: [place];  Service: General;  Laterality: Right;  CLOSURE OF MASTECTOMY INCISION   WISDOM TOOTH EXTRACTION      FAMILY HISTORY :   Family History  Problem Relation Age of Onset   Breast cancer Mother    Diabetes Father     SOCIAL HISTORY:   Social History   Tobacco Use    Smoking status: Never   Smokeless tobacco: Never  Vaping Use   Vaping Use: Never used  Substance Use Topics   Alcohol use: No   Drug use: No    ALLERGIES:  is allergic to sulfa antibiotics.  MEDICATIONS:  Current Outpatient Medications  Medication Sig Dispense Refill   abemaciclib (VERZENIO) 100 MG tablet Take 1 tablet (100 mg total) by mouth 2 (two) times daily. Swallow tablets whole. Do not chew, crush, or split tablets before swallowing. 56 tablet 4   atorvastatin (LIPITOR) 20 MG tablet Take 1 tablet (20 mg total) by mouth daily. 90 tablet 3   clotrimazole-betamethasone (LOTRISONE) cream SMARTSIG:Topical Morning-Evening     fulvestrant (FASLODEX) 250 MG/5ML injection Inject into the muscle every 30 (thirty) days. One injection each buttock over 1-2 minutes. Warm prior to use.     glipiZIDE (GLUCOTROL XL) 10 MG 24 hr tablet Take 1 tablet (10 mg total) by mouth 2 (two) times daily. 180 tablet 3   insulin aspart (NOVOLOG FLEXPEN) 100 UNIT/ML FlexPen Inject 4 Units into the skin 3 (three) times daily with meals. 15 mL 0   Insulin Detemir (LEVEMIR) 100 UNIT/ML Pen Inject 20 Units into the skin at bedtime. 15 mL 0   levothyroxine (SYNTHROID) 25 MCG tablet Take 1 tablet (25  mcg total) by mouth daily before breakfast. 90 tablet 1   lisinopril (ZESTRIL) 10 MG tablet Take 10 mg by mouth daily.     loperamide (IMODIUM A-D) 2 MG tablet Take 2 mg by mouth 4 (four) times daily as needed for diarrhea or loose stools.     ondansetron (ZOFRAN-ODT) 4 MG disintegrating tablet Take 1 tablet (4 mg total) by mouth every 8 (eight) hours as needed for nausea or vomiting. 20 tablet 3   No current facility-administered medications for this visit.    PHYSICAL EXAMINATION: ECOG PERFORMANCE STATUS: 1 - Symptomatic but completely ambulatory  BP 134/74 (BP Location: Left Arm)    Pulse 90    Temp 98.1 F (36.7 C) (Tympanic)    Resp 16    Wt 170 lb 9.6 oz (77.4 kg)    BMI 30.71 kg/m   Filed Weights   10/06/21  0900  Weight: 170 lb 9.6 oz (77.4 kg)    Physical Exam Constitutional:      Comments: She is alone.   HENT:     Head: Normocephalic and atraumatic.     Mouth/Throat:     Pharynx: No oropharyngeal exudate.  Eyes:     Pupils: Pupils are equal, round, and reactive to light.  Cardiovascular:     Rate and Rhythm: Normal rate and regular rhythm.  Pulmonary:     Effort: Pulmonary effort is normal. No respiratory distress.     Breath sounds: Normal breath sounds. No wheezing.  Abdominal:     General: Bowel sounds are normal. There is no distension.     Palpations: Abdomen is soft. There is no mass.     Tenderness: There is no abdominal tenderness. There is no guarding or rebound.  Musculoskeletal:        General: No tenderness. Normal range of motion.     Cervical back: Normal range of motion and neck supple.     Comments: Approximately 1 cm hard nodule felt in the anterior chest wall/left parasternal.  Skin:    General: Skin is warm.  Neurological:     Mental Status: She is alert and oriented to person, place, and time.  Psychiatric:        Mood and Affect: Affect normal.    LABORATORY DATA:  I have reviewed the data as listed    Component Value Date/Time   NA 133 (L) 10/06/2021 0842   NA 135 11/28/2014 1401   K 4.2 10/06/2021 0842   K 4.7 11/28/2014 1401   CL 101 10/06/2021 0842   CL 99 (L) 11/28/2014 1401   CO2 24 10/06/2021 0842   CO2 28 11/28/2014 1401   GLUCOSE 123 (H) 10/06/2021 0842   GLUCOSE 215 (H) 11/28/2014 1401   BUN 18 10/06/2021 0842   BUN 16 11/28/2014 1401   CREATININE 0.92 10/06/2021 0842   CREATININE 0.96 11/28/2014 1401   CALCIUM 10.2 10/06/2021 0842   CALCIUM 9.9 11/28/2014 1401   PROT 7.5 10/06/2021 0842   PROT 7.6 11/28/2014 1401   ALBUMIN 3.9 10/06/2021 0842   ALBUMIN 4.5 11/28/2014 1401   AST 29 10/06/2021 0842   AST 26 11/28/2014 1401   ALT 23 10/06/2021 0842   ALT 28 11/28/2014 1401   ALKPHOS 99 10/06/2021 0842   ALKPHOS 78 11/28/2014  1401   BILITOT 0.4 10/06/2021 0842   BILITOT 0.5 11/28/2014 1401   GFRNONAA >60 10/06/2021 0842   GFRNONAA >60 11/28/2014 1401   GFRAA >60 05/30/2020 1022   GFRAA >60 11/28/2014  1401    No results found for: SPEP, UPEP  Lab Results  Component Value Date   WBC 3.9 (L) 10/06/2021   NEUTROABS 1.7 10/06/2021   HGB 11.9 (L) 10/06/2021   HCT 35.0 (L) 10/06/2021   MCV 98.0 10/06/2021   PLT 239 10/06/2021      Chemistry      Component Value Date/Time   NA 133 (L) 10/06/2021 0842   NA 135 11/28/2014 1401   K 4.2 10/06/2021 0842   K 4.7 11/28/2014 1401   CL 101 10/06/2021 0842   CL 99 (L) 11/28/2014 1401   CO2 24 10/06/2021 0842   CO2 28 11/28/2014 1401   BUN 18 10/06/2021 0842   BUN 16 11/28/2014 1401   CREATININE 0.92 10/06/2021 0842   CREATININE 0.96 11/28/2014 1401      Component Value Date/Time   CALCIUM 10.2 10/06/2021 0842   CALCIUM 9.9 11/28/2014 1401   ALKPHOS 99 10/06/2021 0842   ALKPHOS 78 11/28/2014 1401   AST 29 10/06/2021 0842   AST 26 11/28/2014 1401   ALT 23 10/06/2021 0842   ALT 28 11/28/2014 1401   BILITOT 0.4 10/06/2021 0842   BILITOT 0.5 11/28/2014 1401       RADIOGRAPHIC STUDIES: I have personally reviewed the radiological images as listed and agreed with the findings in the report. No results found.   ASSESSMENT & PLAN:  Carcinoma of lower-outer quadrant of right breast in female, estrogen receptor positive ([place])  # Recurrent breast cancer-ER positive PR negative ? HER-2/neu-currently on Faslodex plus abema. JAN 31st, 2023 STABLE- without evidence of local recurrence or metastatic disease in the chest, abdomen or pelvis. Stable- treatment changes anteriorly in the right upper lobe and in the right axilla.    # Continue Faslodex; Currently on  Abema 100 BID; Labs today-WBC- 3.9 ;ANC1.7 Hb 11platlets-N- STABLE. Continue with abema at this time.   # DM- BG- 123- s/p endocrinology evaluation; with diet and medications- STABLE. Marland Kitchen   # recurrent  UTI/ ? Suprapubic tenderness- S/p eval with urology- STABLE.  Monitor closely on Abema/with neutropenia  # Hypercalcemia: Ca 10.4-resolved- continue just vit D.   # #Incidental findings on CT Imaging dated: JAN, 2023:hepatic steatosis/early cirrhosis/Aortic Atherosclerosis/Systemic atherosclerosis/ constipation/ bilateral foraminal impingement at L4-5; from degenerative disc diseaseI reviewed/discussed/counseled the patient.   #DISPOSITION:early AM appt # Faslodex when ever available  # Follow up in 5 weeksMD/ labs [CBC/CMP/Ca 27-29] Faslodex--Dr.B       No orders of the defined types were placed in this encounter.     All questions were answered. The patient knows to call the clinic with any problems, questions or concerns.      [name], MD 10/06/2021 9:48 AM

## 2021-10-06 NOTE — Assessment & Plan Note (Addendum)
#  Recurrent breast cancer-ER positive PR negative ? HER-2/neu-currently on Faslodex plus abema. JAN 31st, 2023 STABLE- without evidence of local recurrence or metastatic disease in the chest, abdomen or pelvis. Stable- treatment changes anteriorly in the right upper lobe and in the right axilla.  # Continue Faslodex; Currently on  Abema 100 BID; Labs today-WBC- 3.9 ;ANC1.7 Hb 11platlets-N- STABLE. Continue with abema at this time.   # DM- BG- 123- s/p endocrinology evaluation; with diet and medications- STABLE. Marland Kitchen   # recurrent UTI/ ? Suprapubic tenderness- S/p eval with urology- STABLE.  Monitor closely on Abema/with neutropenia  # Hypercalcemia: Ca 10.4-resolved- continue just vit D.   # #Incidental findings on CT Imaging dated: JAN, 2023:hepatic steatosis/early cirrhosis/Aortic Atherosclerosis/Systemic atherosclerosis/ constipation/ bilateral foraminal impingement at L4-5; from degenerative disc diseaseI reviewed/discussed/counseled the patient.   #DISPOSITION:early AM appt # Faslodex when ever available  # Follow up in 5 weeksMD/ labs [CBC/CMP/Ca 27-29] Faslodex--Dr.B   # I reviewed the blood work- with the patient in detail; also reviewed the imaging independently [as summarized above]; and with the patient in detail.

## 2021-10-06 NOTE — Progress Notes (Signed)
Faslodex supplied by patient assistance and has not arrived at the facility at this time.  Patient assistant coordinator is checking on the status of delivery.

## 2021-10-06 NOTE — Progress Notes (Signed)
Patient denies new problems/concerns today.   °

## 2021-10-07 ENCOUNTER — Inpatient Hospital Stay: Payer: PPO

## 2021-10-07 DIAGNOSIS — Z170 Estrogen receptor positive status [ER+]: Secondary | ICD-10-CM

## 2021-10-07 DIAGNOSIS — C50511 Malignant neoplasm of lower-outer quadrant of right female breast: Secondary | ICD-10-CM

## 2021-10-07 DIAGNOSIS — Z5111 Encounter for antineoplastic chemotherapy: Secondary | ICD-10-CM | POA: Diagnosis not present

## 2021-10-07 LAB — CANCER ANTIGEN 27.29: CA 27.29: 23 U/mL (ref 0.0–38.6)

## 2021-10-07 MED ORDER — FULVESTRANT 250 MG/5ML IM SOSY
500.0000 mg | PREFILLED_SYRINGE | Freq: Once | INTRAMUSCULAR | Status: AC
  Administered 2021-10-07: 500 mg via INTRAMUSCULAR
  Filled 2021-10-07: qty 10

## 2021-10-11 DIAGNOSIS — E538 Deficiency of other specified B group vitamins: Secondary | ICD-10-CM | POA: Insufficient documentation

## 2021-10-11 DIAGNOSIS — E559 Vitamin D deficiency, unspecified: Secondary | ICD-10-CM | POA: Insufficient documentation

## 2021-10-27 ENCOUNTER — Other Ambulatory Visit (HOSPITAL_COMMUNITY): Payer: Self-pay

## 2021-10-27 ENCOUNTER — Encounter: Payer: Self-pay | Admitting: Internal Medicine

## 2021-11-10 ENCOUNTER — Encounter: Payer: Self-pay | Admitting: Internal Medicine

## 2021-11-10 ENCOUNTER — Inpatient Hospital Stay: Payer: PPO | Admitting: Internal Medicine

## 2021-11-10 ENCOUNTER — Inpatient Hospital Stay: Payer: PPO | Attending: Internal Medicine

## 2021-11-10 ENCOUNTER — Other Ambulatory Visit: Payer: Self-pay

## 2021-11-10 ENCOUNTER — Inpatient Hospital Stay: Payer: PPO

## 2021-11-10 DIAGNOSIS — Z7984 Long term (current) use of oral hypoglycemic drugs: Secondary | ICD-10-CM | POA: Diagnosis not present

## 2021-11-10 DIAGNOSIS — C50511 Malignant neoplasm of lower-outer quadrant of right female breast: Secondary | ICD-10-CM

## 2021-11-10 DIAGNOSIS — Z79899 Other long term (current) drug therapy: Secondary | ICD-10-CM | POA: Diagnosis not present

## 2021-11-10 DIAGNOSIS — Z5111 Encounter for antineoplastic chemotherapy: Secondary | ICD-10-CM | POA: Insufficient documentation

## 2021-11-10 DIAGNOSIS — Z794 Long term (current) use of insulin: Secondary | ICD-10-CM | POA: Diagnosis not present

## 2021-11-10 DIAGNOSIS — E119 Type 2 diabetes mellitus without complications: Secondary | ICD-10-CM | POA: Diagnosis not present

## 2021-11-10 DIAGNOSIS — Z9013 Acquired absence of bilateral breasts and nipples: Secondary | ICD-10-CM | POA: Insufficient documentation

## 2021-11-10 DIAGNOSIS — Z170 Estrogen receptor positive status [ER+]: Secondary | ICD-10-CM | POA: Diagnosis not present

## 2021-11-10 DIAGNOSIS — I10 Essential (primary) hypertension: Secondary | ICD-10-CM | POA: Diagnosis not present

## 2021-11-10 DIAGNOSIS — N390 Urinary tract infection, site not specified: Secondary | ICD-10-CM | POA: Insufficient documentation

## 2021-11-10 LAB — COMPREHENSIVE METABOLIC PANEL
ALT: 22 U/L (ref 0–44)
AST: 30 U/L (ref 15–41)
Albumin: 3.7 g/dL (ref 3.5–5.0)
Alkaline Phosphatase: 76 U/L (ref 38–126)
Anion gap: 7 (ref 5–15)
BUN: 18 mg/dL (ref 8–23)
CO2: 24 mmol/L (ref 22–32)
Calcium: 9.6 mg/dL (ref 8.9–10.3)
Chloride: 103 mmol/L (ref 98–111)
Creatinine, Ser: 1 mg/dL (ref 0.44–1.00)
GFR, Estimated: >60 mL/min (ref >60)
Glucose, Bld: 115 mg/dL — ABNORMAL HIGH (ref 70–99)
Potassium: 3.4 mmol/L — ABNORMAL LOW (ref 3.5–5.1)
Sodium: 134 mmol/L — ABNORMAL LOW (ref 135–145)
Total Bilirubin: 0.6 mg/dL (ref 0.3–1.2)
Total Protein: 7 g/dL (ref 6.5–8.1)

## 2021-11-10 LAB — CBC WITH DIFFERENTIAL/PLATELET
Abs Immature Granulocytes: 0.01 10*3/uL (ref 0.00–0.07)
Basophils Absolute: 0 10*3/uL (ref 0.0–0.1)
Basophils Relative: 1 %
Eosinophils Absolute: 0 10*3/uL (ref 0.0–0.5)
Eosinophils Relative: 1 %
HCT: 32.4 % — ABNORMAL LOW (ref 36.0–46.0)
Hemoglobin: 11.3 g/dL — ABNORMAL LOW (ref 12.0–15.0)
Immature Granulocytes: 0 %
Lymphocytes Relative: 50 %
Lymphs Abs: 1.4 10*3/uL (ref 0.7–4.0)
MCH: 34 pg (ref 26.0–34.0)
MCHC: 34.9 g/dL (ref 30.0–36.0)
MCV: 97.6 fL (ref 80.0–100.0)
Monocytes Absolute: 0.2 10*3/uL (ref 0.1–1.0)
Monocytes Relative: 6 %
Neutro Abs: 1.2 10*3/uL — ABNORMAL LOW (ref 1.7–7.7)
Neutrophils Relative %: 42 %
Platelets: 219 10*3/uL (ref 150–400)
RBC: 3.32 MIL/uL — ABNORMAL LOW (ref 3.87–5.11)
RDW: 12.9 % (ref 11.5–15.5)
WBC: 2.9 10*3/uL — ABNORMAL LOW (ref 4.0–10.5)
nRBC: 0 % (ref 0.0–0.2)

## 2021-11-10 MED ORDER — FULVESTRANT 250 MG/5ML IM SOSY
500.0000 mg | PREFILLED_SYRINGE | Freq: Once | INTRAMUSCULAR | Status: AC
  Administered 2021-11-10: 500 mg via INTRAMUSCULAR
  Filled 2021-11-10: qty 10

## 2021-11-10 NOTE — Progress Notes (Signed)
Patient denies new problems/concerns today.   °

## 2021-11-10 NOTE — Assessment & Plan Note (Signed)
#  Recurrent breast cancer-ER positive PR negative ? HER-2/neu-currently on Faslodex plus abema. JAN 31st, 2023 STABLE- without evidence of local recurrence or metastatic disease in the chest, abdomen or pelvis. Stable- treatment changes anteriorly in the right upper lobe and in the right axilla.? ?? ?# Continue Faslodex; Currently on  Abema 100 BID; Labs today-WBC- 2.9;ANC1.2 Hb 11 platlets-N- STABLE. Continue with abema at this time.  ? ?# DM- BG- 123- s/p endocrinology evaluation; with diet and medications- STABLE.  ? ?# recurrent UTI/ ? Suprapubic tenderness- S/p eval with urology- STABLE.  Monitor closely on Abema/with neutropenia ? ?# Hypercalcemia: Ca 10.4-resolved- continue just vit D; STABLE.  ? ?#DISPOSITION:early AM appt ?# Faslodex today ?# Follow up in 4 weeksMD/ labs [CBC/CMP/Ca 27-29] Faslodex--Dr.B  ? ? ? ? ?

## 2021-11-10 NOTE — Progress Notes (Signed)
.[place] ?OFFICE PROGRESS NOTE ? ?Patient Care Team: ?Ezequiel Kayser, MD (Inactive) as PCP - General (Internal Medicine) ?Neldon Mc, MD (General Surgery) ?Everlene Farrier, MD (Obstetrics and Gynecology) ?Noreene Filbert, MD ?Forest Gleason, MD (Unknown Physician Specialty) ?[name], MD as Consulting Physician (Hematology and Oncology) ? ? Cancer Staging  ?Carcinoma of lower-outer quadrant of right breast in female, estrogen receptor positive ([place]) ?Staging form: Breast, AJCC 7th Edition ?- Pathologic: ypT1c,ypN2a, MX - Signed by Haywood Lasso, MD on 03/10/2012 ?Cancer stage: ypT1c,ypN2a, MX ? ? ? ?Oncology History Overview Note  ?# DEC 2012- RIGHT BREAST CA T2 N2 M0 tumor from biopsy.  Estrogen receptor positive, Progesterone receptor positive.  Current receptor negative by FISH ?2. Neoadjuvant chemotherapy started in December of 28 with Cytoxan Adriamycin ?3. Started on Taxol weekly chemotherapy. ?4. Patient finished 12  cycles of Taxol chemotherapy in May of 2013.     ?5. Status post right modified radical mastectomy [Dr.Bowers; [place]] June of 2013, ypT1c  yp N2  MO. ?started also on Lerazole ?   ?7. Radiation therapy to the right breast (September of 2013). ? Lymph node was positive for HER-2 receptor gene amplification of 2.22.  Will proceed with Herceptin treatment starting in September of 2013.   ?8.Patient has finished Herceptin (maintenance therapy) in August of 2014 ?8. Start patient on letrozole from November, 2013. ?9. Patient started on Herceptin in September 2013.  ?  ?10Patient finished 12 months of Herceptin therapy on August, 2014 ? ?# 6. Status post left side prophylactic mastectomy. ? ?#Late MAY 2018-RECURRENCE BREAST CA- ER positive/PR negative; ?? HER-2/neu- [biopsy- proven-mediastinal lymph node; in suff for her 2 testing].  [elevated Tumor marker- CT/PET- uptake in Right Mediastinal LN; Sternum [June 2018 EBUS- Dr.Kasa]  ? ?# March 10 2017- faslodex +  Abema; OCT 5th CT-PR of mediastinal LN [consent] ? ?#August 2020-left chest wall nodule biopsy benign ? ?#August 2019- DVT-right upper extremity; Eliquis; stop end of March 2021 [repeat ultrasound negative/patient preference]; # JAN 2022-Cirrhosis- ? On CT scan-FEB 2022MRI liver- Negative fro overt cirrhosis ?--------------------------------------------------------------- -   ? ?DIAGNOSIS: [ BREAST CANCER- ER/PR/HER2 NEU POS ? ?STAGE:  4   ;GOALS: PALLIATIVE ? ?CURRENT/MOST RECENT THERAPY - ABEMA + FASLODEX ? ?  ?Carcinoma of lower-outer quadrant of right breast in female, estrogen receptor positive ([place])  ? ?INTERVAL HISTORY: Alone.  Ambulating independently. ? ?Kimberly [name] 66 y.o.  female pleasant patient above history of metastatic ER PR positive HER-2/neu positive breast cancer currently on abema+ Faslodex is here for follow-up/. ? ?Patient denies any new bladder infections or diarrhea. No nausea vomiting.  No headaches.  No new lumps or bumps.  No back pain.   ? ?Review of Systems  ?Constitutional:  Positive for malaise/fatigue. Negative for chills, diaphoresis, fever and weight loss.  ?HENT:  Negative for nosebleeds and sore throat.   ?Eyes:  Negative for double vision.  ?Respiratory:  Negative for cough, hemoptysis, sputum production, shortness of breath and wheezing.   ?Cardiovascular:  Negative for chest pain, palpitations, orthopnea and leg swelling.  ?Gastrointestinal:  Negative for abdominal pain, blood in stool, constipation, heartburn, melena, nausea and vomiting.  ?Musculoskeletal:  Positive for back pain and joint pain.  ?Neurological:  Negative for dizziness, tingling, focal weakness, weakness and headaches.  ?Endo/Heme/Allergies:  Does not bruise/bleed easily.  ?Psychiatric/Behavioral:  Negative for depression. The patient is not nervous/anxious and does not have insomnia.   ?  ? ?PAST MEDICAL HISTORY :  ?Past Medical  History:  ?Diagnosis Date  ? Arthritis   ? Cancer of lower-outer quadrant  of female breast ([place]) 08/06/2011  ? RIGHT, chemo and bilateral mastectomies.  ? High cholesterol   ? History of kidney stones   ? Hx of bilateral breast implants   ? Hypertension   ? Lung metastases ([place]) 20118  ? chemo pills and faslidex shots.  ? PONV (postoperative nausea and vomiting)   ? Type II diabetes mellitus ([place])   ? fasting 140-150  ? Vertigo   ? LAST WEEK  ? Vertigo 01/2017  ? ? ?PAST SURGICAL HISTORY :   ?Past Surgical History:  ?Procedure Laterality Date  ? BREAST BIOPSY  07/2011, 2020  ? right  ? BREAST RECONSTRUCTION  03/07/2012  ? Procedure: BREAST RECONSTRUCTION;  Surgeon: Crissie Reese, MD;  Location: [place];  Service: Plastics;  Laterality: Left;  BREAST RECONSTRUCTION WITH PLACEMENT OF TISSUE EXPANDER TO LEFT BREAST  ? BREAST RECONSTRUCTION  02/06/2013  ? CESAREAN SECTION  [id number]  ? ENDOBRONCHIAL ULTRASOUND N/A 02/24/2017  ? Procedure: ENDOBRONCHIAL ULTRASOUND;  Surgeon: [name], MD;  Location: [facility];  Service: Cardiopulmonary;  Laterality: N/A;  ? LATISSIMUS FLAP TO BREAST Right 02/06/2013  ? Procedure: LATISSIMUS FLAP TO RIGHT BREAST WITH IMPLANT;  Surgeon: Crissie Reese, MD;  Location: [place];  Service: Plastics;  Laterality: Right;  ? MASTECTOMY  03/07/12  ? modified right; total left  ? MODIFIED MASTECTOMY  03/07/2012  ? Procedure: MODIFIED MASTECTOMY;  Surgeon: Haywood Lasso, MD;  Location: [place];  Service: General;  Laterality: Right;  ? PORTACATH PLACEMENT  07/2011  ? SCAR REVISION  03/30/2012  ? Procedure: SCAR REVISION;  Surgeon: Haywood Lasso, MD;  Location: [place];  Service: General;  Laterality: Right;  CLOSURE OF MASTECTOMY INCISION  ? WISDOM TOOTH EXTRACTION    ? ? ?FAMILY HISTORY :   ?Family History  ?Problem Relation Age of Onset  ? Breast cancer Mother   ? Diabetes Father   ? ? ?SOCIAL HISTORY:   ?Social History  ? ?Tobacco Use  ? Smoking status: Never  ? Smokeless tobacco: Never  ?Vaping Use  ? Vaping Use: Never used  ?Substance Use Topics  ? Alcohol  use: No  ? Drug use: No  ? ? ?ALLERGIES:  is allergic to sulfa antibiotics. ? ?MEDICATIONS:  ?Current Outpatient Medications  ?Medication Sig Dispense Refill  ? abemaciclib (VERZENIO) 100 MG tablet Take 1 tablet (100 mg total) by mouth 2 (two) times daily. Swallow tablets whole. Do not chew, crush, or split tablets before swallowing. 56 tablet 4  ? atorvastatin (LIPITOR) 20 MG tablet Take 1 tablet (20 mg total) by mouth daily. 90 tablet 3  ? Cholecalciferol 50 MCG (2000 UT) CAPS Take 3 capsules daily for 3 months, then reduce to 1 capsule daily thereafter for Vitamin D Deficiency.    ? clotrimazole-betamethasone (LOTRISONE) cream SMARTSIG:Topical Morning-Evening    ? fulvestrant (FASLODEX) 250 MG/5ML injection Inject into the muscle every 30 (thirty) days. One injection each buttock over 1-2 minutes. Warm prior to use.    ? glipiZIDE (GLUCOTROL XL) 10 MG 24 hr tablet Take 1 tablet (10 mg total) by mouth 2 (two) times daily. 180 tablet 3  ? insulin aspart (NOVOLOG FLEXPEN) 100 UNIT/ML FlexPen Inject 4 Units into the skin 3 (three) times daily with meals. 15 mL 0  ? Insulin Detemir (LEVEMIR) 100 UNIT/ML Pen Inject 20 Units into the skin at bedtime. 15 mL 0  ? levothyroxine (SYNTHROID)  25 MCG tablet Take 1 tablet (25 mcg total) by mouth daily before breakfast. 90 tablet 1  ? lisinopril (ZESTRIL) 10 MG tablet Take 10 mg by mouth daily.    ? loperamide (IMODIUM A-D) 2 MG tablet Take 2 mg by mouth 4 (four) times daily as needed for diarrhea or loose stools.    ? magnesium oxide (MAG-OX) 400 MG tablet Take 1 tablet by mouth daily.    ? ondansetron (ZOFRAN-ODT) 4 MG disintegrating tablet Take 1 tablet (4 mg total) by mouth every 8 (eight) hours as needed for nausea or vomiting. 20 tablet 3  ? ?No current facility-administered medications for this visit.  ? ? ?PHYSICAL EXAMINATION: ?ECOG PERFORMANCE STATUS: 1 - Symptomatic but completely ambulatory ? ?BP 117/74   Pulse 90   Temp (!) 97.5 ?F (36.4 ?C)   Resp 18   Wt 171  lb 9.6 oz (77.8 kg)   BMI 30.89 kg/m?  ? ?Filed Weights  ? 11/10/21 0800  ?Weight: 171 lb 9.6 oz (77.8 kg)  ? ? ?Physical Exam ?Constitutional:   ?   Comments: She is alone.   ?HENT:  ?   Head: Normocephalic a

## 2021-11-11 LAB — CANCER ANTIGEN 27.29: CA 27.29: 26.7 U/mL (ref 0.0–38.6)

## 2021-12-08 ENCOUNTER — Inpatient Hospital Stay: Payer: PPO

## 2021-12-08 ENCOUNTER — Encounter: Payer: Self-pay | Admitting: Internal Medicine

## 2021-12-08 ENCOUNTER — Inpatient Hospital Stay: Payer: PPO | Admitting: Internal Medicine

## 2021-12-08 ENCOUNTER — Inpatient Hospital Stay: Payer: PPO | Attending: Internal Medicine

## 2021-12-08 DIAGNOSIS — Z923 Personal history of irradiation: Secondary | ICD-10-CM | POA: Insufficient documentation

## 2021-12-08 DIAGNOSIS — Z794 Long term (current) use of insulin: Secondary | ICD-10-CM | POA: Diagnosis not present

## 2021-12-08 DIAGNOSIS — Z5111 Encounter for antineoplastic chemotherapy: Secondary | ICD-10-CM | POA: Diagnosis present

## 2021-12-08 DIAGNOSIS — Z9221 Personal history of antineoplastic chemotherapy: Secondary | ICD-10-CM | POA: Diagnosis not present

## 2021-12-08 DIAGNOSIS — Z79899 Other long term (current) drug therapy: Secondary | ICD-10-CM | POA: Insufficient documentation

## 2021-12-08 DIAGNOSIS — Z803 Family history of malignant neoplasm of breast: Secondary | ICD-10-CM | POA: Diagnosis not present

## 2021-12-08 DIAGNOSIS — I10 Essential (primary) hypertension: Secondary | ICD-10-CM | POA: Insufficient documentation

## 2021-12-08 DIAGNOSIS — E119 Type 2 diabetes mellitus without complications: Secondary | ICD-10-CM | POA: Diagnosis not present

## 2021-12-08 DIAGNOSIS — Z170 Estrogen receptor positive status [ER+]: Secondary | ICD-10-CM

## 2021-12-08 DIAGNOSIS — C50511 Malignant neoplasm of lower-outer quadrant of right female breast: Secondary | ICD-10-CM | POA: Diagnosis present

## 2021-12-08 DIAGNOSIS — Z9013 Acquired absence of bilateral breasts and nipples: Secondary | ICD-10-CM | POA: Insufficient documentation

## 2021-12-08 DIAGNOSIS — Z7984 Long term (current) use of oral hypoglycemic drugs: Secondary | ICD-10-CM | POA: Diagnosis not present

## 2021-12-08 LAB — CBC WITH DIFFERENTIAL/PLATELET
Abs Immature Granulocytes: 0.01 10*3/uL (ref 0.00–0.07)
Basophils Absolute: 0.1 10*3/uL (ref 0.0–0.1)
Basophils Relative: 2 %
Eosinophils Absolute: 0.1 10*3/uL (ref 0.0–0.5)
Eosinophils Relative: 2 %
HCT: 35.2 % — ABNORMAL LOW (ref 36.0–46.0)
Hemoglobin: 12 g/dL (ref 12.0–15.0)
Immature Granulocytes: 0 %
Lymphocytes Relative: 42 %
Lymphs Abs: 1.6 10*3/uL (ref 0.7–4.0)
MCH: 33.6 pg (ref 26.0–34.0)
MCHC: 34.1 g/dL (ref 30.0–36.0)
MCV: 98.6 fL (ref 80.0–100.0)
Monocytes Absolute: 0.2 10*3/uL (ref 0.1–1.0)
Monocytes Relative: 6 %
Neutro Abs: 1.8 10*3/uL (ref 1.7–7.7)
Neutrophils Relative %: 48 %
Platelets: 235 10*3/uL (ref 150–400)
RBC: 3.57 MIL/uL — ABNORMAL LOW (ref 3.87–5.11)
RDW: 12.7 % (ref 11.5–15.5)
WBC: 3.8 10*3/uL — ABNORMAL LOW (ref 4.0–10.5)
nRBC: 0 % (ref 0.0–0.2)

## 2021-12-08 LAB — COMPREHENSIVE METABOLIC PANEL
ALT: 24 U/L (ref 0–44)
AST: 30 U/L (ref 15–41)
Albumin: 3.7 g/dL (ref 3.5–5.0)
Alkaline Phosphatase: 104 U/L (ref 38–126)
Anion gap: 11 (ref 5–15)
BUN: 16 mg/dL (ref 8–23)
CO2: 24 mmol/L (ref 22–32)
Calcium: 10.1 mg/dL (ref 8.9–10.3)
Chloride: 99 mmol/L (ref 98–111)
Creatinine, Ser: 0.97 mg/dL (ref 0.44–1.00)
GFR, Estimated: >60 mL/min (ref >60)
Glucose, Bld: 279 mg/dL — ABNORMAL HIGH (ref 70–99)
Potassium: 3.5 mmol/L (ref 3.5–5.1)
Sodium: 134 mmol/L — ABNORMAL LOW (ref 135–145)
Total Bilirubin: 0.5 mg/dL (ref 0.3–1.2)
Total Protein: 7.1 g/dL (ref 6.5–8.1)

## 2021-12-08 MED ORDER — FULVESTRANT 250 MG/5ML IM SOSY
500.0000 mg | PREFILLED_SYRINGE | Freq: Once | INTRAMUSCULAR | Status: AC
  Administered 2021-12-08: 500 mg via INTRAMUSCULAR

## 2021-12-08 NOTE — Assessment & Plan Note (Addendum)
#  Recurrent breast cancer-ER positive PR negative ? HER-2/neu-currently on Faslodex plus abema. JAN 31st, 2023 STABLE- without evidence of local recurrence or metastatic disease in the chest, abdomen or pelvis. Stable- treatment changes anteriorly in the right upper lobe and in the right axilla.? ?? ?# Continue Faslodex; Currently on  Abema 100 BID; Labs today-WBC-3.8;ANC-1.8; Hb 12 platlets-N- STABLE. Continue with abema at this time. Will repeat CT scan in 2 months/will order at next visit.  ? ?# DM- BG- 279- s/p endocrinology evaluation; with diet and medications- STABLE.  ? ?# recurrent UTI/ ? Suprapubic tenderness- S/p eval with urology- STABLE.   Monitor closely on Abema/with neutropenia ? ?# Hypercalcemia: Ca 10.4-resolved- continue just vit D; STABLE.   ? ?#DISPOSITION:early AM appt ?# Faslodex today ?# Follow up in 4 weeksMD/ labs [CBC/CMP/Ca 27-29] Faslodex--Dr.B  ? ? ? ? ?

## 2021-12-08 NOTE — Progress Notes (Signed)
.[place] ?OFFICE PROGRESS NOTE ? ?Patient Care Team: ?Ezequiel Kayser, MD as PCP - General (Internal Medicine) ?Neldon Mc, MD (General Surgery) ?Everlene Farrier, MD (Obstetrics and Gynecology) ?Noreene Filbert, MD ?Forest Gleason, MD (Unknown Physician Specialty) ?[name], MD as Consulting Physician (Hematology and Oncology) ? ? Cancer Staging  ?Carcinoma of lower-outer quadrant of right breast in female, estrogen receptor positive ([place]) ?Staging form: Breast, AJCC 7th Edition ?- Pathologic: ypT1c,ypN2a, MX - Signed by Haywood Lasso, MD on 03/10/2012 ?Cancer stage: ypT1c,ypN2a, MX ? ? ? ?Oncology History Overview Note  ?# DEC 2012- RIGHT BREAST CA T2 N2 M0 tumor from biopsy.  Estrogen receptor positive, Progesterone receptor positive.  Current receptor negative by FISH ?2. Neoadjuvant chemotherapy started in December of 28 with Cytoxan Adriamycin ?3. Started on Taxol weekly chemotherapy. ?4. Patient finished 12  cycles of Taxol chemotherapy in May of 2013.     ?5. Status post right modified radical mastectomy [Dr.Bowers; [place]] June of 2013, ypT1c  yp N2  MO. ?started also on Lerazole ?   ?7. Radiation therapy to the right breast (September of 2013). ? Lymph node was positive for HER-2 receptor gene amplification of 2.22.  Will proceed with Herceptin treatment starting in September of 2013.   ?8.Patient has finished Herceptin (maintenance therapy) in August of 2014 ?8. Start patient on letrozole from November, 2013. ?9. Patient started on Herceptin in September 2013.  ?  ?10Patient finished 12 months of Herceptin therapy on August, 2014 ? ?# 6. Status post left side prophylactic mastectomy. ? ?#Late MAY 2018-RECURRENCE BREAST CA- ER positive/PR negative; ?? HER-2/neu- [biopsy- proven-mediastinal lymph node; in suff for her 2 testing].  [elevated Tumor marker- CT/PET- uptake in Right Mediastinal LN; Sternum [June 2018 EBUS- Dr.Kasa]  ? ?# March 10 2017- faslodex + Abema; OCT 5th  CT-PR of mediastinal LN [consent] ? ?#August 2020-left chest wall nodule biopsy benign ? ?#August 2019- DVT-right upper extremity; Eliquis; stop end of March 2021 [repeat ultrasound negative/patient preference]; # JAN 2022-Cirrhosis- ? On CT scan-FEB 2022MRI liver- Negative fro overt cirrhosis ?--------------------------------------------------------------- -   ? ?DIAGNOSIS: [ BREAST CANCER- ER/PR/HER2 NEU POS ? ?STAGE:  4   ;GOALS: PALLIATIVE ? ?CURRENT/MOST RECENT THERAPY - ABEMA + FASLODEX ? ?  ?Carcinoma of lower-outer quadrant of right breast in female, estrogen receptor positive ([place])  ? ?INTERVAL HISTORY: Alone.  Ambulating independently. ? ?Kimberly [name] 66 y.o.  female pleasant patient above history of metastatic ER PR positive HER-2/neu positive breast cancer currently on abema+ Faslodex is here for follow-up/. ? ?Patient denies any nausea vomiting or diarrhea.  No headaches.  No new lumps or bumps.  No new infections.  Back pain.   ? ?Review of Systems  ?Constitutional:  Positive for malaise/fatigue. Negative for chills, diaphoresis, fever and weight loss.  ?HENT:  Negative for nosebleeds and sore throat.   ?Eyes:  Negative for double vision.  ?Respiratory:  Negative for cough, hemoptysis, sputum production, shortness of breath and wheezing.   ?Cardiovascular:  Negative for chest pain, palpitations, orthopnea and leg swelling.  ?Gastrointestinal:  Negative for abdominal pain, blood in stool, constipation, heartburn, melena, nausea and vomiting.  ?Musculoskeletal:  Positive for back pain and joint pain.  ?Neurological:  Negative for dizziness, tingling, focal weakness, weakness and headaches.  ?Endo/Heme/Allergies:  Does not bruise/bleed easily.  ?Psychiatric/Behavioral:  Negative for depression. The patient is not nervous/anxious and does not have insomnia.   ?  ? ?PAST MEDICAL HISTORY :  ?Past Medical History:  ?  Diagnosis Date  ? Arthritis   ? Cancer of lower-outer quadrant of female breast ([place])  08/06/2011  ? RIGHT, chemo and bilateral mastectomies.  ? High cholesterol   ? History of kidney stones   ? Hx of bilateral breast implants   ? Hypertension   ? Lung metastases 20118  ? chemo pills and faslidex shots.  ? PONV (postoperative nausea and vomiting)   ? Type II diabetes mellitus ([place])   ? fasting 140-150  ? Vertigo   ? LAST WEEK  ? Vertigo 01/2017  ? ? ?PAST SURGICAL HISTORY :   ?Past Surgical History:  ?Procedure Laterality Date  ? BREAST BIOPSY  07/2011, 2020  ? right  ? BREAST RECONSTRUCTION  03/07/2012  ? Procedure: BREAST RECONSTRUCTION;  Surgeon: Crissie Reese, MD;  Location: [facility];  Service: Plastics;  Laterality: Left;  BREAST RECONSTRUCTION WITH PLACEMENT OF TISSUE EXPANDER TO LEFT BREAST  ? BREAST RECONSTRUCTION  02/06/2013  ? CESAREAN SECTION  4920,[id number]  ? ENDOBRONCHIAL ULTRASOUND N/A 02/24/2017  ? Procedure: ENDOBRONCHIAL ULTRASOUND;  Surgeon: [name], MD;  Location: [facility];  Service: Cardiopulmonary;  Laterality: N/A;  ? LATISSIMUS FLAP TO BREAST Right 02/06/2013  ? Procedure: LATISSIMUS FLAP TO RIGHT BREAST WITH IMPLANT;  Surgeon: Crissie Reese, MD;  Location: [facility];  Service: Plastics;  Laterality: Right;  ? MASTECTOMY  03/07/12  ? modified right; total left  ? MODIFIED MASTECTOMY  03/07/2012  ? Procedure: MODIFIED MASTECTOMY;  Surgeon: Haywood Lasso, MD;  Location: [facility];  Service: General;  Laterality: Right;  ? PORTACATH PLACEMENT  07/2011  ? SCAR REVISION  03/30/2012  ? Procedure: SCAR REVISION;  Surgeon: Haywood Lasso, MD;  Location: [facility];  Service: General;  Laterality: Right;  CLOSURE OF MASTECTOMY INCISION  ? WISDOM TOOTH EXTRACTION    ? ? ?FAMILY HISTORY :   ?Family History  ?Problem Relation Age of Onset  ? Breast cancer Mother   ? Diabetes Father   ? ? ?SOCIAL HISTORY:   ?Social History  ? ?Tobacco Use  ? Smoking status: Never  ? Smokeless tobacco: Never  ?Vaping Use  ? Vaping Use: Never used  ?Substance Use Topics  ? Alcohol use: No  ? Drug use: No   ? ? ?ALLERGIES:  is allergic to sulfa antibiotics. ? ?MEDICATIONS:  ?Current Outpatient Medications  ?Medication Sig Dispense Refill  ? abemaciclib (VERZENIO) 100 MG tablet Take 1 tablet (100 mg total) by mouth 2 (two) times daily. Swallow tablets whole. Do not chew, crush, or split tablets before swallowing. 56 tablet 4  ? atorvastatin (LIPITOR) 20 MG tablet Take 1 tablet (20 mg total) by mouth daily. 90 tablet 3  ? Cholecalciferol 50 MCG (2000 UT) CAPS Take 3 capsules daily for 3 months, then reduce to 1 capsule daily thereafter for Vitamin D Deficiency.    ? clotrimazole-betamethasone (LOTRISONE) cream SMARTSIG:Topical Morning-Evening    ? fulvestrant (FASLODEX) 250 MG/5ML injection Inject into the muscle every 30 (thirty) days. One injection each buttock over 1-2 minutes. Warm prior to use.    ? glipiZIDE (GLUCOTROL XL) 10 MG 24 hr tablet Take 1 tablet (10 mg total) by mouth 2 (two) times daily. 180 tablet 3  ? insulin aspart (NOVOLOG FLEXPEN) 100 UNIT/ML FlexPen Inject 4 Units into the skin 3 (three) times daily with meals. 15 mL 0  ? Insulin Detemir (LEVEMIR) 100 UNIT/ML Pen Inject 20 Units into the skin at bedtime. 15 mL 0  ? levothyroxine (SYNTHROID) 25 MCG tablet  Take 1 tablet (25 mcg total) by mouth daily before breakfast. 90 tablet 1  ? lisinopril (ZESTRIL) 10 MG tablet Take 10 mg by mouth daily.    ? loperamide (IMODIUM A-D) 2 MG tablet Take 2 mg by mouth 4 (four) times daily as needed for diarrhea or loose stools.    ? magnesium oxide (MAG-OX) 400 MG tablet Take 1 tablet by mouth daily.    ? ondansetron (ZOFRAN-ODT) 4 MG disintegrating tablet Take 1 tablet (4 mg total) by mouth every 8 (eight) hours as needed for nausea or vomiting. 20 tablet 3  ? ?No current facility-administered medications for this visit.  ? ?Facility-Administered Medications Ordered in Other Visits  ?Medication Dose Route Frequency Provider Last Rate Last Admin  ? fulvestrant (FASLODEX) injection 500 mg  500 mg Intramuscular Once  [name], MD      ? ? ?PHYSICAL EXAMINATION: ?ECOG PERFORMANCE STATUS: 1 - Symptomatic but completely ambulatory ? ?BP 133/77   Pulse 96   Temp 98.4 ?F (36.9 ?C)   Resp 16   Wt 170 lb 6.4 oz (77.

## 2021-12-08 NOTE — Progress Notes (Signed)
Patient denies new problems/concerns today.   °

## 2021-12-09 LAB — CANCER ANTIGEN 27.29: CA 27.29: 25.4 U/mL (ref 0.0–38.6)

## 2021-12-11 ENCOUNTER — Encounter: Payer: Self-pay | Admitting: Internal Medicine

## 2021-12-11 MED ORDER — MONTELUKAST SODIUM 10 MG PO TABS: 10.0000 mg | ORAL_TABLET | Freq: Every day | ORAL | 0 refills | Status: DC

## 2021-12-11 NOTE — Telephone Encounter (Signed)
Called in Singulair for allergies. ?

## 2022-01-03 ENCOUNTER — Other Ambulatory Visit: Payer: Self-pay | Admitting: Internal Medicine

## 2022-01-05 ENCOUNTER — Encounter: Payer: Self-pay | Admitting: Internal Medicine

## 2022-01-05 ENCOUNTER — Inpatient Hospital Stay: Payer: PPO

## 2022-01-05 ENCOUNTER — Inpatient Hospital Stay: Payer: PPO | Attending: Internal Medicine

## 2022-01-05 ENCOUNTER — Inpatient Hospital Stay: Payer: PPO | Admitting: Internal Medicine

## 2022-01-05 DIAGNOSIS — C50511 Malignant neoplasm of lower-outer quadrant of right female breast: Secondary | ICD-10-CM

## 2022-01-05 DIAGNOSIS — Z5111 Encounter for antineoplastic chemotherapy: Secondary | ICD-10-CM | POA: Diagnosis present

## 2022-01-05 DIAGNOSIS — Z7984 Long term (current) use of oral hypoglycemic drugs: Secondary | ICD-10-CM | POA: Diagnosis not present

## 2022-01-05 DIAGNOSIS — Z86718 Personal history of other venous thrombosis and embolism: Secondary | ICD-10-CM | POA: Diagnosis not present

## 2022-01-05 DIAGNOSIS — Z170 Estrogen receptor positive status [ER+]: Secondary | ICD-10-CM | POA: Diagnosis not present

## 2022-01-05 DIAGNOSIS — E871 Hypo-osmolality and hyponatremia: Secondary | ICD-10-CM | POA: Diagnosis not present

## 2022-01-05 DIAGNOSIS — Z79899 Other long term (current) drug therapy: Secondary | ICD-10-CM | POA: Diagnosis not present

## 2022-01-05 DIAGNOSIS — E119 Type 2 diabetes mellitus without complications: Secondary | ICD-10-CM | POA: Diagnosis not present

## 2022-01-05 DIAGNOSIS — Z794 Long term (current) use of insulin: Secondary | ICD-10-CM | POA: Diagnosis not present

## 2022-01-05 DIAGNOSIS — Z9013 Acquired absence of bilateral breasts and nipples: Secondary | ICD-10-CM | POA: Insufficient documentation

## 2022-01-05 LAB — COMPREHENSIVE METABOLIC PANEL
ALT: 27 U/L (ref 0–44)
AST: 38 U/L (ref 15–41)
Albumin: 3.5 g/dL (ref 3.5–5.0)
Alkaline Phosphatase: 79 U/L (ref 38–126)
Anion gap: 7 (ref 5–15)
BUN: 20 mg/dL (ref 8–23)
CO2: 23 mmol/L (ref 22–32)
Calcium: 9.5 mg/dL (ref 8.9–10.3)
Chloride: 99 mmol/L (ref 98–111)
Creatinine, Ser: 1.43 mg/dL — ABNORMAL HIGH (ref 0.44–1.00)
GFR, Estimated: 40 mL/min — ABNORMAL LOW (ref >60)
Glucose, Bld: 230 mg/dL — ABNORMAL HIGH (ref 70–99)
Potassium: 3.9 mmol/L (ref 3.5–5.1)
Sodium: 129 mmol/L — ABNORMAL LOW (ref 135–145)
Total Bilirubin: 0.6 mg/dL (ref 0.3–1.2)
Total Protein: 6.8 g/dL (ref 6.5–8.1)

## 2022-01-05 LAB — CBC WITH DIFFERENTIAL/PLATELET
Abs Immature Granulocytes: 0.01 10*3/uL (ref 0.00–0.07)
Basophils Absolute: 0.1 10*3/uL (ref 0.0–0.1)
Basophils Relative: 2 %
Eosinophils Absolute: 0 10*3/uL (ref 0.0–0.5)
Eosinophils Relative: 1 %
HCT: 34.2 % — ABNORMAL LOW (ref 36.0–46.0)
Hemoglobin: 11.8 g/dL — ABNORMAL LOW (ref 12.0–15.0)
Immature Granulocytes: 0 %
Lymphocytes Relative: 49 %
Lymphs Abs: 1.2 10*3/uL (ref 0.7–4.0)
MCH: 33.6 pg (ref 26.0–34.0)
MCHC: 34.5 g/dL (ref 30.0–36.0)
MCV: 97.4 fL (ref 80.0–100.0)
Monocytes Absolute: 0.2 10*3/uL (ref 0.1–1.0)
Monocytes Relative: 6 %
Neutro Abs: 1.1 10*3/uL — ABNORMAL LOW (ref 1.7–7.7)
Neutrophils Relative %: 42 %
Platelets: 226 10*3/uL (ref 150–400)
RBC: 3.51 MIL/uL — ABNORMAL LOW (ref 3.87–5.11)
RDW: 13.2 % (ref 11.5–15.5)
WBC: 2.6 10*3/uL — ABNORMAL LOW (ref 4.0–10.5)
nRBC: 0 % (ref 0.0–0.2)

## 2022-01-05 MED ORDER — FULVESTRANT 250 MG/5ML IM SOSY
500.0000 mg | PREFILLED_SYRINGE | Freq: Once | INTRAMUSCULAR | Status: AC
  Administered 2022-01-05: 500 mg via INTRAMUSCULAR
  Filled 2022-01-05: qty 10

## 2022-01-05 NOTE — Progress Notes (Signed)
.[place] ?OFFICE PROGRESS NOTE ? ?Patient Care Team: ?Ezequiel Kayser, MD as PCP - General (Internal Medicine) ?Neldon Mc, MD (General Surgery) ?Everlene Farrier, MD (Obstetrics and Gynecology) ?Noreene Filbert, MD ?Forest Gleason, MD (Unknown Physician Specialty) ?[name], MD as Consulting Physician (Hematology and Oncology) ? ? Cancer Staging  ?Carcinoma of lower-outer quadrant of right breast in female, estrogen receptor positive ([place]) ?Staging form: Breast, AJCC 7th Edition ?- Pathologic: ypT1c,ypN2a, MX - Signed by Haywood Lasso, MD on 03/10/2012 ?Cancer stage: ypT1c,ypN2a, MX ? ? ? ?Oncology History Overview Note  ?# DEC 2012- RIGHT BREAST CA T2 N2 M0 tumor from biopsy.  Estrogen receptor positive, Progesterone receptor positive.  Current receptor negative by FISH ?2. Neoadjuvant chemotherapy started in December of 28 with Cytoxan Adriamycin ?3. Started on Taxol weekly chemotherapy. ?4. Patient finished 12  cycles of Taxol chemotherapy in May of 2013.     ?5. Status post right modified radical mastectomy [Dr.Bowers; [place]] June of 2013, ypT1c  yp N2  MO. ?started also on Lerazole ?   ?7. Radiation therapy to the right breast (September of 2013). ? Lymph node was positive for HER-2 receptor gene amplification of 2.22.  Will proceed with Herceptin treatment starting in September of 2013.   ?8.Patient has finished Herceptin (maintenance therapy) in August of 2014 ?8. Start patient on letrozole from November, 2013. ?9. Patient started on Herceptin in September 2013.  ?  ?10Patient finished 12 months of Herceptin therapy on August, 2014 ? ?# 6. Status post left side prophylactic mastectomy. ? ?#Late MAY 2018-RECURRENCE BREAST CA- ER positive/PR negative; ?? HER-2/neu- [biopsy- proven-mediastinal lymph node; in suff for her 2 testing].  [elevated Tumor marker- CT/PET- uptake in Right Mediastinal LN; Sternum [June 2018 EBUS- Dr.Kasa]  ? ?# March 10 2017- faslodex + Abema; OCT 5th  CT-PR of mediastinal LN [consent] ? ?#August 2020-left chest wall nodule biopsy benign ? ?#August 2019- DVT-right upper extremity; Eliquis; stop end of March 2021 [repeat ultrasound negative/patient preference]; # JAN 2022-Cirrhosis- ? On CT scan-FEB 2022MRI liver- Negative fro overt cirrhosis ?--------------------------------------------------------------- -   ? ?DIAGNOSIS: [ BREAST CANCER- ER/PR/HER2 NEU POS ? ?STAGE:  4   ;GOALS: PALLIATIVE ? ?CURRENT/MOST RECENT THERAPY - ABEMA + FASLODEX ? ?  ?Carcinoma of lower-outer quadrant of right breast in female, estrogen receptor positive ([place])  ? ?INTERVAL HISTORY: Alone.  Ambulating independently. ? ?Kimberly [name] 66 y.o.  female pleasant patient above history of metastatic ER PR positive HER-2/neu positive breast cancer currently on abema+ Faslodex is here for follow-up/. ? ?Patient reports not feeling well for the past couple of days.  Tail bone pain (10/10) for the past couple of weeks after "falling" sitting to hard on toilet.  Otherwise denies any significant trauma.  Patient has been taking Tylenol.  ? ?States the patient has not been drinking of fluids in the last few days.  Had diarrhea at least 2 loose stools yesterday.  Currently resolved. ? ?Patient denies any nausea vomiting or diarrhea.  No headaches.  No new lumps or bumps.  No new infections.  ? ?Review of Systems  ?Constitutional:  Positive for malaise/fatigue. Negative for chills, diaphoresis, fever and weight loss.  ?HENT:  Negative for nosebleeds and sore throat.   ?Eyes:  Negative for double vision.  ?Respiratory:  Negative for cough, hemoptysis, sputum production, shortness of breath and wheezing.   ?Cardiovascular:  Negative for chest pain, palpitations, orthopnea and leg swelling.  ?Gastrointestinal:  Negative for abdominal pain, blood in  stool, constipation, heartburn, melena, nausea and vomiting.  ?Musculoskeletal:  Positive for back pain and joint pain.  ?Neurological:  Negative for  dizziness, tingling, focal weakness, weakness and headaches.  ?Endo/Heme/Allergies:  Does not bruise/bleed easily.  ?Psychiatric/Behavioral:  Negative for depression. The patient is not nervous/anxious and does not have insomnia.   ?  ? ?PAST MEDICAL HISTORY :  ?Past Medical History:  ?Diagnosis Date  ? Arthritis   ? Cancer of lower-outer quadrant of female breast ([place]) 08/06/2011  ? RIGHT, chemo and bilateral mastectomies.  ? High cholesterol   ? History of kidney stones   ? Hx of bilateral breast implants   ? Hypertension   ? Lung metastases 20118  ? chemo pills and faslidex shots.  ? PONV (postoperative nausea and vomiting)   ? Type II diabetes mellitus ([place])   ? fasting 140-150  ? Vertigo   ? LAST WEEK  ? Vertigo 01/2017  ? ? ?PAST SURGICAL HISTORY :   ?Past Surgical History:  ?Procedure Laterality Date  ? BREAST BIOPSY  07/2011, 2020  ? right  ? BREAST RECONSTRUCTION  03/07/2012  ? Procedure: BREAST RECONSTRUCTION;  Surgeon: Crissie Reese, MD;  Location: [place];  Service: Plastics;  Laterality: Left;  BREAST RECONSTRUCTION WITH PLACEMENT OF TISSUE EXPANDER TO LEFT BREAST  ? BREAST RECONSTRUCTION  02/06/2013  ? CESAREAN SECTION  4332,[id number]  ? ENDOBRONCHIAL ULTRASOUND N/A 02/24/2017  ? Procedure: ENDOBRONCHIAL ULTRASOUND;  Surgeon: [name], MD;  Location: [facility];  Service: Cardiopulmonary;  Laterality: N/A;  ? LATISSIMUS FLAP TO BREAST Right 02/06/2013  ? Procedure: LATISSIMUS FLAP TO RIGHT BREAST WITH IMPLANT;  Surgeon: Crissie Reese, MD;  Location: [facility];  Service: Plastics;  Laterality: Right;  ? MASTECTOMY  03/07/12  ? modified right; total left  ? MODIFIED MASTECTOMY  03/07/2012  ? Procedure: MODIFIED MASTECTOMY;  Surgeon: Haywood Lasso, MD;  Location: [place];  Service: General;  Laterality: Right;  ? PORTACATH PLACEMENT  07/2011  ? SCAR REVISION  03/30/2012  ? Procedure: SCAR REVISION;  Surgeon: Haywood Lasso, MD;  Location: [facility];  Service: General;  Laterality: Right;  CLOSURE OF  MASTECTOMY INCISION  ? WISDOM TOOTH EXTRACTION    ? ? ?FAMILY HISTORY :   ?Family History  ?Problem Relation Age of Onset  ? Breast cancer Mother   ? Diabetes Father   ? ? ?SOCIAL HISTORY:   ?Social History  ? ?Tobacco Use  ? Smoking status: Never  ? Smokeless tobacco: Never  ?Vaping Use  ? Vaping Use: Never used  ?Substance Use Topics  ? Alcohol use: No  ? Drug use: No  ? ? ?ALLERGIES:  is allergic to sulfa antibiotics. ? ?MEDICATIONS:  ?Current Outpatient Medications  ?Medication Sig Dispense Refill  ? abemaciclib (VERZENIO) 100 MG tablet Take 1 tablet (100 mg total) by mouth 2 (two) times daily. Swallow tablets whole. Do not chew, crush, or split tablets before swallowing. 56 tablet 4  ? atorvastatin (LIPITOR) 20 MG tablet Take 1 tablet (20 mg total) by mouth daily. 90 tablet 3  ? Cholecalciferol 50 MCG (2000 UT) CAPS Take 3 capsules daily for 3 months, then reduce to 1 capsule daily thereafter for Vitamin D Deficiency.    ? clotrimazole-betamethasone (LOTRISONE) cream SMARTSIG:Topical Morning-Evening    ? fulvestrant (FASLODEX) 250 MG/5ML injection Inject into the muscle every 30 (thirty) days. One injection each buttock over 1-2 minutes. Warm prior to use.    ? glipiZIDE (GLUCOTROL XL) 10 MG 24 hr  tablet Take 1 tablet (10 mg total) by mouth 2 (two) times daily. 180 tablet 3  ? insulin aspart (NOVOLOG FLEXPEN) 100 UNIT/ML FlexPen Inject 4 Units into the skin 3 (three) times daily with meals. 15 mL 0  ? Insulin Detemir (LEVEMIR) 100 UNIT/ML Pen Inject 20 Units into the skin at bedtime. 15 mL 0  ? levothyroxine (SYNTHROID) 25 MCG tablet Take 1 tablet (25 mcg total) by mouth daily before breakfast. 90 tablet 1  ? lisinopril (ZESTRIL) 10 MG tablet Take 10 mg by mouth daily.    ? loperamide (IMODIUM A-D) 2 MG tablet Take 2 mg by mouth 4 (four) times daily as needed for diarrhea or loose stools.    ? magnesium oxide (MAG-OX) 400 MG tablet Take 1 tablet by mouth daily.    ? montelukast (SINGULAIR) 10 MG tablet Take 1  tablet (10 mg total) by mouth at bedtime. 30 tablet 0  ? ondansetron (ZOFRAN-ODT) 4 MG disintegrating tablet Take 1 tablet (4 mg total) by mouth every 8 (eight) hours as needed for nausea or vomiting. 20 tablet

## 2022-01-05 NOTE — Assessment & Plan Note (Addendum)
#  Recurrent breast cancer-ER positive PR negative ? HER-2/neu-currently on Faslodex plus abema. JAN 31st, 2023 STABLE- without evidence of local recurrence or metastatic disease in the chest, abdomen or pelvis. Stable- treatment changes anteriorly in the right upper lobe and in the right axilla.? ?? ?# Continue Faslodex; Currently on  Abema 100 BID; Labs today-WBC-2.9;ANC-1.1; Hb-11- 12 platlets-N-  STABLE.Continue with abema at this time. Will repeat CT scan today.  ? ?# Hyponatremia [129]/ elevated creatinine [1.4]-sec to poor hydration/diarrhea- ? abema- recommend increasing fluids/salt intake electrolytes.  Continue albema.  ? ?# DM- BG- 230- s/p endocrinology evaluation; with diet and medications- STABLE.  ? ?# recurrent UTI/ ? Suprapubic tenderness- S/p eval with urology- STABLE.   Monitor closely on Abema/with neutropenia- STABLE.  ? ?# Hypercalcemia: Ca 10.4-resolved- continue just vit D- STABLE.  ? ?#DISPOSITION:early AM appt ?# Faslodex today ?# Follow up in 4 weeksMD/ labs [CBC/CMP/Ca 27-29] Faslodex; CT CAP--Dr.B  ? ? ? ? ?

## 2022-01-05 NOTE — Progress Notes (Signed)
Patient reports not feeling well for the past couple of days.  Tail bone pain (10/10) for the past couple of weeks after "falling" sitting to hard on toilet.   ?

## 2022-01-06 ENCOUNTER — Encounter: Payer: Self-pay | Admitting: Internal Medicine

## 2022-01-06 LAB — CANCER ANTIGEN 27.29: CA 27.29: 23.9 U/mL (ref 0.0–38.6)

## 2022-01-11 ENCOUNTER — Other Ambulatory Visit: Payer: Self-pay | Admitting: Internal Medicine

## 2022-01-11 DIAGNOSIS — C50511 Malignant neoplasm of lower-outer quadrant of right female breast: Secondary | ICD-10-CM

## 2022-01-11 DIAGNOSIS — Z170 Estrogen receptor positive status [ER+]: Secondary | ICD-10-CM

## 2022-01-11 NOTE — Telephone Encounter (Signed)
CBC with Differential ?Order: [id number] ?Status: Final result    ?Visible to patient: Yes (seen)    ?Next appt: 01/29/2022 at 08:30 AM in Radiology [facility])    ?Dx: Carcinoma of lower-outer quadrant of ...    ?0 Result Notes ?          ?Component Ref Range & Units 6 d ago 1 mo ago 2 mo ago 3 mo ago 4 mo ago 5 mo ago 6 mo ago  ?WBC 4.0 - 10.5 K/uL 2.6 Low   3.8 Low   2.9 Low   3.9 Low   3.6 Low   4.5  3.3 Low    ?RBC 3.87 - 5.11 MIL/uL 3.51 Low   3.57 Low   3.32 Low   3.57 Low   3.39 Low   3.67 Low   3.62 Low    ?Hemoglobin 12.0 - 15.0 g/dL 11.8 Low   12.0  11.3 Low   11.9 Low   11.0 Low   11.8 Low   11.2 Low    ?HCT 36.0 - 46.0 % 34.2 Low   35.2 Low   32.4 Low   35.0 Low   32.5 Low   34.0 Low   34.0 Low    ?MCV 80.0 - 100.0 fL 97.4  98.6  97.6  98.0  95.9  92.6  93.9   ?MCH 26.0 - 34.0 pg 33.6  33.6  34.0  33.3  32.4  32.2  30.9   ?MCHC 30.0 - 36.0 g/dL 34.5  34.1  34.9  34.0  33.8  34.7  32.9   ?RDW 11.5 - 15.5 % 13.2  12.7  12.9  13.6  15.9 High   15.9 High   14.6   ?Platelets 150 - 400 K/uL 226  235  219  239  264  217  229   ?nRBC 0.0 - 0.2 % 0.0  0.0  0.0  0.0  0.0  0.0  0.0   ?Neutrophils Relative % % 42  48  42  44  44  33  34   ?Neutro Abs 1.7 - 7.7 K/uL 1.1 Low   1.8  1.2 Low   1.7  1.6 Low   1.5 Low   1.1 Low    ?Lymphocytes Relative % 49  42  50  44  47  57  58   ?Lymphs Abs 0.7 - 4.0 K/uL 1.2  1.6  1.4  1.8  1.7  2.6  1.9   ?Monocytes Relative % '6  6  6  7  6  6  5   '$ ?Monocytes Absolute 0.1 - 1.0 K/uL 0.2  0.2  0.2  0.3  0.2  0.3  0.2   ?Eosinophils Relative % '1  2  1  2  1  2  2   '$ ?Eosinophils Absolute 0.0 - 0.5 K/uL 0.0  0.1  0.0  0.1  0.0  0.1  0.1   ?Basophils Relative % '2  2  1  2  2  2  1   '$ ?Basophils Absolute 0.0 - 0.1 K/uL 0.1  0.1  0.0  0.1  0.1  0.1  0.0   ?Immature Granulocytes % 0  0  0  1  0  0  0   ?Abs Immature Granulocytes 0.00 - 0.07 K/uL 0.01  0.01 CM  0.01 CM  0.02 CM  0.01 CM  0.01 CM  0.00 CM   ?Comment: Performed at [facility], [address] [place],  [place] [address]   ?Resulting Agency  [facility] [facility] [place] [facility] [place] [facility] [place] [facility] [place] [facility] [place] [facility] [facility]  ?  ? ?  ?  ?Specimen Collected: 01/05/22 07:57 Last Resulted: 01/05/22 08:19  ?  ?  Lab Flowsheet   ? Order Details   ? View Encounter   ? Lab and Collection Details   ? Routing   ? Result History    ?View Encounter Conversation    ?  ?CM=Additional comments    ?  ?Result Care Coordination ? ? ?Patient Communication ? ? Add Comments   Seen Back to Top  ?  ?  ? ?Other Results from 01/05/2022 ? ? Contains abnormal data Comprehensive metabolic panel ?Order: [id number] ?Status: Final result    ?Visible to patient: Yes (seen)    ?Next appt: 01/29/2022 at 08:30 AM in Radiology [facility])    ?Dx: Carcinoma of lower-outer quadrant of ...    ?0 Result Notes ?          ?Component Ref Range & Units 6 d ago ?(01/05/22) 1 mo ago ?(12/08/21) 2 mo ago ?(11/10/21) 3 mo ago ?(10/06/21) 3 mo ago ?(09/15/21) 4 mo ago ?(09/08/21) 5 mo ago ?(08/11/21)  ?Sodium 135 - 145 mmol/L 129 Low   134 Low   134 Low   133 Low   134 Low   132 Low   136   ?Potassium 3.5 - 5.1 mmol/L 3.9  3.5  3.4 Low   4.2  3.8  3.7  3.6   ?Chloride 98 - 111 mmol/L 99  99  103  101  103  98  102   ?CO2 22 - 32 mmol/L '23  24  24  24  23  28  23   '$ ?Glucose, Bld 70 - 99 mg/dL 230 High   279 High  CM  115 High  CM  123 High  CM  121 High  CM  200 High  CM  83 CM   ?Comment: Glucose reference range applies only to samples taken after fasting for at least 8 hours.  ?BUN 8 - 23 mg/dL '20  16  18  18  17  20  16   '$ ?Creatinine, Ser 0.44 - 1.00 mg/dL 1.43 High   0.97  1.00  0.92  0.89  1.06 High   1.08 High    ?Calcium 8.9 - 10.3 mg/dL 9.5  10.1  9.6  10.2  9.4  10.4 High   9.7   ?Total Protein 6.5 - 8.1 g/dL 6.8  7.1  7.0  7.5   7.3  7.4   ?Albumin 3.5 - 5.0 g/dL 3.5  3.7  3.7  3.9   3.7  3.9   ?AST 15 - 41 U/L 38  '30  30  29   29  '$ 38   ?ALT 0 - 44 U/L '27  24  22  23   23  30   '$ ?Alkaline Phosphatase 38 - 126 U/L 79  104  76  99   105  82   ?Total Bilirubin 0.3 - 1.2 mg/dL 0.6  0.5  0.6   0.4   0.6  0.3   ?GFR, Estimated >60 mL/min 40 Low   >60 CM  >60 CM  >60 CM  >60 CM  58 Low  CM  57 Low  CM   ?Comment: (NOTE)  ?Calculated using the CKD-EPI Creatinine Equation (2021)   ?Anion gap 5 - 15  7  11 CM  7 CM  8 CM  8 CM  6 CM  11 CM   ?Comment: Performed at [facility], [address] [place], [place] [address]  ?Resulting Agency  [place] [facility] [place] [facility] [place] [facility] [place] [facility] [place] [facility] [place] [facility] [place] [facility]  ?  ? ?  ?  ?Specimen Collected: 01/05/22 07:57 Last Resulted: 01/05/22 08:34  ?  ?  ?  ? ?Component Ref Range & Units 6 d ago 1 mo ago 2 mo ago 3 mo ago 4 mo ago 5 mo ago 6 mo ago  ?CA 27.29 0.0 - 38.6 U/mL 23.9  25.4 CM  26.7 CM  23.0 CM  23.2 CM  29.0 CM  27.3 CM   ?Comment: (NOTE)  ?[employer] Academic librarian [facility])  ?Values obtained with different assay methods or kits cannot be used  ?interchangeably. Results cannot be interpreted as absolute evidence  ?of the presence or absence of malignant disease.  ?Performed At: [place]  ?[address] [place], [place] [id number]  ?Rush Farmer MD YH:[id number]   ?Resulting Agency  [place] [facility] [place] [facility] [place] [facility] [place] [facility] [place] [facility] [place] [facility] [place] [facility]  ?  ? ?  ?  ?Specimen Collected: 01/05/22 07:57 Last Resulted: 01/06/22 01:35  ?  ?  ? ?

## 2022-01-12 ENCOUNTER — Other Ambulatory Visit: Payer: Self-pay | Admitting: Internal Medicine

## 2022-01-12 ENCOUNTER — Encounter: Payer: Self-pay | Admitting: Internal Medicine

## 2022-01-12 MED ORDER — CIPROFLOXACIN HCL 500 MG PO TABS
500.0000 mg | ORAL_TABLET | Freq: Two times a day (BID) | ORAL | 0 refills | Status: DC
Start: 2022-01-12 — End: 2022-03-03

## 2022-01-29 ENCOUNTER — Ambulatory Visit
Admission: RE | Admit: 2022-01-29 | Discharge: 2022-01-29 | Disposition: A | Discharge status: Home / Self Care | Payer: PPO | Source: Ambulatory Visit | Attending: Internal Medicine | Admitting: Internal Medicine

## 2022-01-29 DIAGNOSIS — C50511 Malignant neoplasm of lower-outer quadrant of right female breast: Secondary | ICD-10-CM | POA: Insufficient documentation

## 2022-01-29 DIAGNOSIS — Z170 Estrogen receptor positive status [ER+]: Secondary | ICD-10-CM | POA: Insufficient documentation

## 2022-01-29 MED ORDER — IOHEXOL 300 MG/ML  SOLN
80.0000 mL | Freq: Once | INTRAMUSCULAR | Status: AC | PRN
  Administered 2022-01-29: 80 mL via INTRAVENOUS

## 2022-02-02 ENCOUNTER — Other Ambulatory Visit: Payer: Self-pay | Admitting: Internal Medicine

## 2022-02-02 ENCOUNTER — Encounter: Payer: Self-pay | Admitting: Internal Medicine

## 2022-02-02 ENCOUNTER — Inpatient Hospital Stay: Payer: PPO

## 2022-02-02 ENCOUNTER — Inpatient Hospital Stay: Payer: PPO | Admitting: Internal Medicine

## 2022-02-02 ENCOUNTER — Inpatient Hospital Stay: Payer: PPO | Attending: Internal Medicine

## 2022-02-02 DIAGNOSIS — C50511 Malignant neoplasm of lower-outer quadrant of right female breast: Secondary | ICD-10-CM

## 2022-02-02 DIAGNOSIS — Z170 Estrogen receptor positive status [ER+]: Secondary | ICD-10-CM

## 2022-02-02 DIAGNOSIS — C7800 Secondary malignant neoplasm of unspecified lung: Secondary | ICD-10-CM | POA: Insufficient documentation

## 2022-02-02 DIAGNOSIS — Z86718 Personal history of other venous thrombosis and embolism: Secondary | ICD-10-CM | POA: Insufficient documentation

## 2022-02-02 DIAGNOSIS — Z9013 Acquired absence of bilateral breasts and nipples: Secondary | ICD-10-CM | POA: Insufficient documentation

## 2022-02-02 DIAGNOSIS — Z7984 Long term (current) use of oral hypoglycemic drugs: Secondary | ICD-10-CM | POA: Insufficient documentation

## 2022-02-02 DIAGNOSIS — Z923 Personal history of irradiation: Secondary | ICD-10-CM | POA: Insufficient documentation

## 2022-02-02 DIAGNOSIS — Z794 Long term (current) use of insulin: Secondary | ICD-10-CM | POA: Insufficient documentation

## 2022-02-02 DIAGNOSIS — Z79899 Other long term (current) drug therapy: Secondary | ICD-10-CM | POA: Insufficient documentation

## 2022-02-02 DIAGNOSIS — Z9221 Personal history of antineoplastic chemotherapy: Secondary | ICD-10-CM | POA: Insufficient documentation

## 2022-02-02 DIAGNOSIS — Z5111 Encounter for antineoplastic chemotherapy: Secondary | ICD-10-CM | POA: Insufficient documentation

## 2022-02-02 LAB — COMPREHENSIVE METABOLIC PANEL
ALT: 29 U/L (ref 0–44)
AST: 44 U/L — ABNORMAL HIGH (ref 15–41)
Albumin: 3.6 g/dL (ref 3.5–5.0)
Alkaline Phosphatase: 62 U/L (ref 38–126)
Anion gap: 8 (ref 5–15)
BUN: 14 mg/dL (ref 8–23)
CO2: 22 mmol/L (ref 22–32)
Calcium: 8.8 mg/dL — ABNORMAL LOW (ref 8.9–10.3)
Chloride: 103 mmol/L (ref 98–111)
Creatinine, Ser: 1.12 mg/dL — ABNORMAL HIGH (ref 0.44–1.00)
GFR, Estimated: 54 mL/min — ABNORMAL LOW (ref >60)
Glucose, Bld: 225 mg/dL — ABNORMAL HIGH (ref 70–99)
Potassium: 4 mmol/L (ref 3.5–5.1)
Sodium: 133 mmol/L — ABNORMAL LOW (ref 135–145)
Total Bilirubin: 0.6 mg/dL (ref 0.3–1.2)
Total Protein: 7.1 g/dL (ref 6.5–8.1)

## 2022-02-02 LAB — CBC WITH DIFFERENTIAL/PLATELET
Abs Immature Granulocytes: 0.01 10*3/uL (ref 0.00–0.07)
Basophils Absolute: 0.1 10*3/uL (ref 0.0–0.1)
Basophils Relative: 2 %
Eosinophils Absolute: 0.1 10*3/uL (ref 0.0–0.5)
Eosinophils Relative: 1 %
HCT: 34.2 % — ABNORMAL LOW (ref 36.0–46.0)
Hemoglobin: 11.6 g/dL — ABNORMAL LOW (ref 12.0–15.0)
Immature Granulocytes: 0 %
Lymphocytes Relative: 45 %
Lymphs Abs: 1.6 10*3/uL (ref 0.7–4.0)
MCH: 33.2 pg (ref 26.0–34.0)
MCHC: 33.9 g/dL (ref 30.0–36.0)
MCV: 98 fL (ref 80.0–100.0)
Monocytes Absolute: 0.3 10*3/uL (ref 0.1–1.0)
Monocytes Relative: 8 %
Neutro Abs: 1.6 10*3/uL — ABNORMAL LOW (ref 1.7–7.7)
Neutrophils Relative %: 44 %
Platelets: 246 10*3/uL (ref 150–400)
RBC: 3.49 MIL/uL — ABNORMAL LOW (ref 3.87–5.11)
RDW: 13.8 % (ref 11.5–15.5)
WBC: 3.5 10*3/uL — ABNORMAL LOW (ref 4.0–10.5)
nRBC: 0 % (ref 0.0–0.2)

## 2022-02-02 MED ORDER — ABEMACICLIB 100 MG PO TABS: ORAL_TABLET | ORAL | 0 refills | Status: DC

## 2022-02-02 MED ORDER — FULVESTRANT 250 MG/5ML IM SOSY
500.0000 mg | PREFILLED_SYRINGE | Freq: Once | INTRAMUSCULAR | Status: AC
  Administered 2022-02-02: 500 mg via INTRAMUSCULAR
  Filled 2022-02-02: qty 10

## 2022-02-02 NOTE — Progress Notes (Signed)
.[place] OFFICE PROGRESS NOTE  Patient Care Team: Ezequiel Kayser, MD as PCP - General (Internal Medicine) Neldon Mc, MD (General Surgery) Everlene Farrier, MD (Obstetrics and Gynecology) Noreene Filbert, MD Forest Gleason, MD (Unknown Physician Specialty) [name], MD as Consulting Physician (Hematology and Oncology)   Cancer Staging  Carcinoma of lower-outer quadrant of right breast in female, estrogen receptor positive [facility]) Staging form: Breast, AJCC 7th Edition - Pathologic: ypT1c,ypN2a, MX - Signed by Haywood Lasso, MD on 03/10/2012 Cancer stage: ypT1c,ypN2a, MX    Oncology History Overview Note  # DEC 2012- RIGHT BREAST CA T2 N2 M0 tumor from biopsy.  Estrogen receptor positive, Progesterone receptor positive.  Current receptor negative by FISH 2. Neoadjuvant chemotherapy started in December of 28 with Cytoxan Adriamycin 3. Started on Taxol weekly chemotherapy. 4. Patient finished 12  cycles of Taxol chemotherapy in May of 2013.     5. Status post right modified radical mastectomy [Dr.Bowers; [place]] June of 2013, ypT1c  yp N2  MO. started also on Lerazole    7. Radiation therapy to the right breast (September of 2013).  Lymph node was positive for HER-2 receptor gene amplification of 2.22.  Will proceed with Herceptin treatment starting in September of 2013.   8.Patient has finished Herceptin (maintenance therapy) in August of 2014 8. Start patient on letrozole from November, 2013. 9. Patient started on Herceptin in September 2013.    10Patient finished 12 months of Herceptin therapy on August, 2014  # 6. Status post left side prophylactic mastectomy.  #Late MAY 2018-RECURRENCE BREAST CA- ER positive/PR negative; ?? HER-2/neu- [biopsy- proven-mediastinal lymph node; in suff for her 2 testing].  [elevated Tumor marker- CT/PET- uptake in Right Mediastinal LN; Sternum [June 2018 EBUS- Dr.Kasa]   # March 10 2017- faslodex + Abema; OCT 5th  CT-PR of mediastinal LN [consent]  #August 2020-left chest wall nodule biopsy benign  #August 2019- DVT-right upper extremity; Eliquis; stop end of March 2021 [repeat ultrasound negative/patient preference]; # JAN 2022-Cirrhosis- ? On CT scan-FEB 2022MRI liver- Negative fro overt cirrhosis --------------------------------------------------------------- -    DIAGNOSIS: [ BREAST CANCER- ER/PR/HER2 NEU POS  STAGE:  4   ;GOALS: PALLIATIVE  CURRENT/MOST RECENT THERAPY - ABEMA + FASLODEX    Carcinoma of lower-outer quadrant of right breast in female, estrogen receptor positive ([place])   INTERVAL HISTORY: Alone.  Ambulating independently.  Kayah Hecker Renz 66 y.o.  female pleasant patient above history of metastatic ER PR positive HER-2/neu positive breast cancer currently on abema+ Faslodex is here for follow-up/review results of the CT scan.  Patient since the last visit had episode of UTI treated with antibiotics symptoms are resolved.  Patient denies any further falls.  Her tailbone area status post fall approximately 6 weeks ago is improving.  Patient denies any nausea vomiting or diarrhea.  No headaches.  No new lumps or bumps.  No new infections.   Review of Systems  Constitutional:  Positive for malaise/fatigue. Negative for chills, diaphoresis, fever and weight loss.  HENT:  Negative for nosebleeds and sore throat.   Eyes:  Negative for double vision.  Respiratory:  Negative for cough, hemoptysis, sputum production, shortness of breath and wheezing.   Cardiovascular:  Negative for chest pain, palpitations, orthopnea and leg swelling.  Gastrointestinal:  Negative for abdominal pain, blood in stool, constipation, heartburn, melena, nausea and vomiting.  Musculoskeletal:  Positive for back pain and joint pain.  Neurological:  Negative for dizziness, tingling, focal weakness, weakness and headaches.  Endo/Heme/Allergies:  Does not bruise/bleed easily.  Psychiatric/Behavioral:   Negative for depression. The patient is not nervous/anxious and does not have insomnia.      PAST MEDICAL HISTORY :  Past Medical History:  Diagnosis Date   Arthritis    Cancer of lower-outer quadrant of female breast ([place]) 08/06/2011   RIGHT, chemo and bilateral mastectomies.   High cholesterol    History of kidney stones    Hx of bilateral breast implants    Hypertension    Lung metastases 20118   chemo pills and faslidex shots.   PONV (postoperative nausea and vomiting)    Type II diabetes mellitus ([place])    fasting 140-150   Vertigo    LAST WEEK   Vertigo 01/2017    PAST SURGICAL HISTORY :   Past Surgical History:  Procedure Laterality Date   BREAST BIOPSY  07/2011, 2020   right   BREAST RECONSTRUCTION  03/07/2012   Procedure: BREAST RECONSTRUCTION;  Surgeon: Crissie Reese, MD;  Location: [place];  Service: Plastics;  Laterality: Left;  BREAST RECONSTRUCTION WITH PLACEMENT OF TISSUE EXPANDER TO LEFT BREAST   BREAST RECONSTRUCTION  02/06/2013   CESAREAN SECTION  [id number]   ENDOBRONCHIAL ULTRASOUND N/A 02/24/2017   Procedure: ENDOBRONCHIAL ULTRASOUND;  Surgeon: [name], MD;  Location: [facility];  Service: Cardiopulmonary;  Laterality: N/A;   LATISSIMUS FLAP TO BREAST Right 02/06/2013   Procedure: LATISSIMUS FLAP TO RIGHT BREAST WITH IMPLANT;  Surgeon: Crissie Reese, MD;  Location: [place];  Service: Plastics;  Laterality: Right;   MASTECTOMY  03/07/12   modified right; total left   MODIFIED MASTECTOMY  03/07/2012   Procedure: MODIFIED MASTECTOMY;  Surgeon: Haywood Lasso, MD;  Location: [place];  Service: General;  Laterality: Right;   PORTACATH PLACEMENT  07/2011   SCAR REVISION  03/30/2012   Procedure: SCAR REVISION;  Surgeon: Haywood Lasso, MD;  Location: [place];  Service: General;  Laterality: Right;  CLOSURE OF MASTECTOMY INCISION   WISDOM TOOTH EXTRACTION      FAMILY HISTORY :   Family History  Problem Relation Age of Onset   Breast cancer Mother     Diabetes Father     SOCIAL HISTORY:   Social History   Tobacco Use   Smoking status: Never   Smokeless tobacco: Never  Vaping Use   Vaping Use: Never used  Substance Use Topics   Alcohol use: No   Drug use: No    ALLERGIES:  is allergic to sulfa antibiotics.  MEDICATIONS:  Current Outpatient Medications  Medication Sig Dispense Refill   Cholecalciferol 50 MCG (2000 UT) CAPS Take 3 capsules daily for 3 months, then reduce to 1 capsule daily thereafter for Vitamin D Deficiency.     clotrimazole-betamethasone (LOTRISONE) cream SMARTSIG:Topical Morning-Evening     fulvestrant (FASLODEX) 250 MG/5ML injection Inject into the muscle every 30 (thirty) days. One injection each buttock over 1-2 minutes. Warm prior to use.     glipiZIDE (GLUCOTROL XL) 10 MG 24 hr tablet Take 1 tablet (10 mg total) by mouth 2 (two) times daily. 180 tablet 3   insulin aspart (NOVOLOG FLEXPEN) 100 UNIT/ML FlexPen Inject 4 Units into the skin 3 (three) times daily with meals. 15 mL 0   Insulin Detemir (LEVEMIR) 100 UNIT/ML Pen Inject 20 Units into the skin at bedtime. 15 mL 0   levothyroxine (SYNTHROID) 25 MCG tablet Take 1 tablet (25 mcg total) by mouth daily before breakfast. 90 tablet 1   lisinopril (  ZESTRIL) 10 MG tablet Take 10 mg by mouth daily.     loperamide (IMODIUM A-D) 2 MG tablet Take 2 mg by mouth 4 (four) times daily as needed for diarrhea or loose stools.     magnesium oxide (MAG-OX) 400 MG tablet Take 1 tablet by mouth daily.     montelukast (SINGULAIR) 10 MG tablet TAKE 1 TABLET BY MOUTH EVERYDAY AT BEDTIME 30 tablet 0   ondansetron (ZOFRAN-ODT) 4 MG disintegrating tablet Take 1 tablet (4 mg total) by mouth every 8 (eight) hours as needed for nausea or vomiting. 20 tablet 3   abemaciclib (VERZENIO) 100 MG tablet TAKE 1 TABLET BY MOUTH TWICE DAILY. SWALLOW TABLETS WHOLE. DO NOT CHEW, CRUSH, OR SPLIT TABLETS BEFORE SWALLOWING 56 tablet 0   atorvastatin (LIPITOR) 20 MG tablet Take 1 tablet (20 mg  total) by mouth daily. 90 tablet 3   ciprofloxacin (CIPRO) 500 MG tablet Take 1 tablet (500 mg total) by mouth 2 (two) times daily. (Patient not taking: Reported on 02/02/2022) 10 tablet 0   No current facility-administered medications for this visit.   Facility-Administered Medications Ordered in Other Visits  Medication Dose Route Frequency Provider Last Rate Last Admin   fulvestrant (FASLODEX) injection 500 mg  500 mg Intramuscular Once [name], MD        PHYSICAL EXAMINATION: ECOG PERFORMANCE STATUS: 1 - Symptomatic but completely ambulatory  BP 111/70   Pulse 96   Temp 98.7 F (37.1 C)   Resp 18   Wt 167 lb 12.8 oz (76.1 kg)   BMI 30.20 kg/m   Filed Weights   02/02/22 1000  Weight: 167 lb 12.8 oz (76.1 kg)     Physical Exam Constitutional:      Comments: She is alone.   HENT:     Head: Normocephalic and atraumatic.     Mouth/Throat:     Pharynx: No oropharyngeal exudate.  Eyes:     Pupils: Pupils are equal, round, and reactive to light.  Cardiovascular:     Rate and Rhythm: Normal rate and regular rhythm.  Pulmonary:     Effort: Pulmonary effort is normal. No respiratory distress.     Breath sounds: Normal breath sounds. No wheezing.  Abdominal:     General: Bowel sounds are normal. There is no distension.     Palpations: Abdomen is soft. There is no mass.     Tenderness: There is no abdominal tenderness. There is no guarding or rebound.  Musculoskeletal:        General: No tenderness. Normal range of motion.     Cervical back: Normal range of motion and neck supple.     Comments: Approximately 1 cm hard nodule felt in the anterior chest wall/left parasternal.  Skin:    General: Skin is warm.  Neurological:     Mental Status: She is alert and oriented to person, place, and time.  Psychiatric:        Mood and Affect: Affect normal.    LABORATORY DATA:  I have reviewed the data as listed    Component Value Date/Time   NA 133 (L) 02/02/2022  1009   NA 135 11/28/2014 1401   K 4.0 02/02/2022 1009   K 4.7 11/28/2014 1401   CL 103 02/02/2022 1009   CL 99 (L) 11/28/2014 1401   CO2 22 02/02/2022 1009   CO2 28 11/28/2014 1401   GLUCOSE 225 (H) 02/02/2022 1009   GLUCOSE 215 (H) 11/28/2014 1401   BUN 14 02/02/2022  1009   BUN 16 11/28/2014 1401   CREATININE 1.12 (H) 02/02/2022 1009   CREATININE 0.96 11/28/2014 1401   CALCIUM 8.8 (L) 02/02/2022 1009   CALCIUM 9.9 11/28/2014 1401   PROT 7.1 02/02/2022 1009   PROT 7.6 11/28/2014 1401   ALBUMIN 3.6 02/02/2022 1009   ALBUMIN 4.5 11/28/2014 1401   AST 44 (H) 02/02/2022 1009   AST 26 11/28/2014 1401   ALT 29 02/02/2022 1009   ALT 28 11/28/2014 1401   ALKPHOS 62 02/02/2022 1009   ALKPHOS 78 11/28/2014 1401   BILITOT 0.6 02/02/2022 1009   BILITOT 0.5 11/28/2014 1401   GFRNONAA 54 (L) 02/02/2022 1009   GFRNONAA >60 11/28/2014 1401   GFRAA >60 05/30/2020 1022   GFRAA >60 11/28/2014 1401    No results found for: SPEP, UPEP  Lab Results  Component Value Date   WBC 3.5 (L) 02/02/2022   NEUTROABS 1.6 (L) 02/02/2022   HGB 11.6 (L) 02/02/2022   HCT 34.2 (L) 02/02/2022   MCV 98.0 02/02/2022   PLT 246 02/02/2022      Chemistry      Component Value Date/Time   NA 133 (L) 02/02/2022 1009   NA 135 11/28/2014 1401   K 4.0 02/02/2022 1009   K 4.7 11/28/2014 1401   CL 103 02/02/2022 1009   CL 99 (L) 11/28/2014 1401   CO2 22 02/02/2022 1009   CO2 28 11/28/2014 1401   BUN 14 02/02/2022 1009   BUN 16 11/28/2014 1401   CREATININE 1.12 (H) 02/02/2022 1009   CREATININE 0.96 11/28/2014 1401      Component Value Date/Time   CALCIUM 8.8 (L) 02/02/2022 1009   CALCIUM 9.9 11/28/2014 1401   ALKPHOS 62 02/02/2022 1009   ALKPHOS 78 11/28/2014 1401   AST 44 (H) 02/02/2022 1009   AST 26 11/28/2014 1401   ALT 29 02/02/2022 1009   ALT 28 11/28/2014 1401   BILITOT 0.6 02/02/2022 1009   BILITOT 0.5 11/28/2014 1401       RADIOGRAPHIC STUDIES: I have personally reviewed the  radiological images as listed and agreed with the findings in the report. No results found.   ASSESSMENT & PLAN:  Carcinoma of lower-outer quadrant of right breast in female, estrogen receptor positive ([place])  # Recurrent breast cancer-ER positive PR negative ? HER-2/neu-currently on Faslodex plus abema. June 2nd, 2023- CT CAP-  No definitive evidence to suggest recurrent or metastatic disease in the chest, abdomen or pelvis.    # Continue Faslodex; Currently on  Abema 100 BID; Labs today-WBC-3.5; ANC-1.6; Hb-11- 12 platlets-N-  STABLE.Continue with abema at this time.   # Hyponatremia [133]/ elevated creatinine [1.2]-sec to poor hydration/diarrhea- ? abema- recommend increasing fluids/salt intake electrolytes. STABLE.   Continue albema.   # DM- BG- 230- s/p endocrinology evaluation; with diet and medications- STABLE.   # recurrent UTI/ ? Suprapubic tenderness- S/p eval with urology- STABLE.   Monitor closely on Abema/with neutropenia- STABLE.   # Hypercalcemia: Ca 10.4-resolved- continue just vit D- STABLE.   #Incidental findings on Imaging CT June 2023: Arteriosclerosis atherosclerosis; mitral calcification; diverticulosis without diverticulitis.  I reviewed/discussed/counseled the patient.   #DISPOSITION:early AM appt # Faslodex today # Follow up in 4 weeksMD/ labs [CBC/CMP/Ca 27-29] Faslodex; --Dr.B         Orders Placed This Encounter  Procedures   Cancer antigen 27.29    Standing Status:   Future    Standing Expiration Date:   02/03/2023      All questions  were answered. The patient knows to call the clinic with any problems, questions or concerns.      [name], MD 02/02/2022 11:41 AM

## 2022-02-02 NOTE — Assessment & Plan Note (Addendum)
#  Recurrent breast cancer-ER positive PR negative ? HER-2/neu-currently on Faslodex plus abema. June 2nd, 2023- CT CAP-  No definitive evidence to suggest recurrent or metastatic disease in the chest, abdomen or pelvis.   # Continue Faslodex; Currently on  Abema 100 BID; Labs today-WBC-3.5; ANC-1.6; Hb-11- 12 platlets-N-  STABLE.Continue with abema at this time.   # Hyponatremia [133]/ elevated creatinine [1.2]-sec to poor hydration/diarrhea- ? abema- recommend increasing fluids/salt intake electrolytes. STABLE.   Continue albema.   # DM- BG- 230- s/p endocrinology evaluation; with diet and medications- STABLE.   # recurrent UTI/ ? Suprapubic tenderness- S/p eval with urology- STABLE.   Monitor closely on Abema/with neutropenia- STABLE.   # Hypercalcemia: Ca 10.4-resolved- continue just vit D- STABLE.   #Incidental findings on Imaging CT June 2023: Arteriosclerosis atherosclerosis; mitral calcification; diverticulosis without diverticulitis.  I reviewed/discussed/counseled the patient.   #DISPOSITION:early AM appt # Faslodex today # Follow up in 4 weeksMD/ labs [CBC/CMP/Ca 27-29] Faslodex; --Dr.B

## 2022-02-02 NOTE — Progress Notes (Signed)
UTI symptoms have improved.  Neuropathy for years in her feet that is worse at night.    Verzenio refill pended for MD review and requesting refills on rx.

## 2022-02-03 ENCOUNTER — Other Ambulatory Visit: Payer: Self-pay | Admitting: Internal Medicine

## 2022-02-03 DIAGNOSIS — Z170 Estrogen receptor positive status [ER+]: Secondary | ICD-10-CM

## 2022-02-03 LAB — CANCER ANTIGEN 27.29: CA 27.29: 33.8 U/mL (ref 0.0–38.6)

## 2022-02-04 ENCOUNTER — Encounter: Payer: Self-pay | Admitting: Internal Medicine

## 2022-02-04 NOTE — Telephone Encounter (Signed)
CBC with Differential Order: [id number] Status: Final result    Visible to patient: Yes (seen)    Next appt: 03/03/2022 at 02:45 PM in Oncology ([facility])    Dx: Carcinoma of lower-outer quadrant of ...    0 Result Notes           Component Ref Range & Units 2 d ago (02/02/22) 1 mo ago (01/05/22) 1 mo ago (12/08/21) 2 mo ago (11/10/21) 4 mo ago (10/06/21) 4 mo ago (09/08/21) 5 mo ago (08/11/21)  WBC 4.0 - 10.5 K/uL 3.5 Low   2.6 Low   3.8 Low   2.9 Low   3.9 Low   3.6 Low   4.5   RBC 3.87 - 5.11 MIL/uL 3.49 Low   3.51 Low   3.57 Low   3.32 Low   3.57 Low   3.39 Low   3.67 Low    Hemoglobin 12.0 - 15.0 g/dL 11.6 Low   11.8 Low   12.0  11.3 Low   11.9 Low   11.0 Low   11.8 Low    HCT 36.0 - 46.0 % 34.2 Low   34.2 Low   35.2 Low   32.4 Low   35.0 Low   32.5 Low   34.0 Low    MCV 80.0 - 100.0 fL 98.0  97.4  98.6  97.6  98.0  95.9  92.6   MCH 26.0 - 34.0 pg 33.2  33.6  33.6  34.0  33.3  32.4  32.2   MCHC 30.0 - 36.0 g/dL 33.9  34.5  34.1  34.9  34.0  33.8  34.7   RDW 11.5 - 15.5 % 13.8  13.2  12.7  12.9  13.6  15.9 High   15.9 High    Platelets 150 - 400 K/uL 246  226  235  219  239  264  217   nRBC 0.0 - 0.2 % 0.0  0.0  0.0  0.0  0.0  0.0  0.0   Neutrophils Relative % % 44  42  48  42  44  44  33   Neutro Abs 1.7 - 7.7 K/uL 1.6 Low   1.1 Low   1.8  1.2 Low   1.7  1.6 Low   1.5 Low    Lymphocytes Relative % 45  49  42  50  44  47  57   Lymphs Abs 0.7 - 4.0 K/uL 1.6  1.2  1.6  1.4  1.8  1.7  2.6   Monocytes Relative % '8  6  6  6  7  6  6   '$ Monocytes Absolute 0.1 - 1.0 K/uL 0.3  0.2  0.2  0.2  0.3  0.2  0.3   Eosinophils Relative % '1  1  2  1  2  1  2   '$ Eosinophils Absolute 0.0 - 0.5 K/uL 0.1  0.0  0.1  0.0  0.1  0.0  0.1   Basophils Relative % '2  2  2  1  2  2  2   '$ Basophils Absolute 0.0 - 0.1 K/uL 0.1  0.1  0.1  0.0  0.1  0.1  0.1   Immature Granulocytes % 0  0  0  0  1  0  0   Abs Immature Granulocytes 0.00 - 0.07 K/uL 0.01  0.01 CM  0.01 CM  0.01 CM  0.02 CM  0.01 CM  0.01 CM   Comment:  Performed  at [facility], [address] [place], [place] [address]  Resulting Agency  [facility] CLIN LAB [place] [facility] [place] [facility] [place] [facility] [place] [facility] [place] [facility] [place] [facility]         Specimen Collected: 02/02/22 10:09 Last Resulted: 02/02/22 10:29      Lab Flowsheet    Order Details    View Encounter    Lab and Collection Details    Routing    Result History    View All Conversations on this Encounter      CM=Additional comments      Result Care Coordination   Patient Communication   Add Comments   Seen Back to Top       Other Results from 02/02/2022  Cancer antigen 27.29 Order: [id number] Status: Final result    Visible to patient: Yes (seen)    Next appt: 03/03/2022 at 02:45 PM in Oncology ([facility])    Dx: Carcinoma of lower-outer quadrant of ...    0 Result Notes           Component Ref Range & Units 2 d ago (02/02/22) 1 mo ago (01/05/22) 1 mo ago (12/08/21) 2 mo ago (11/10/21) 4 mo ago (10/06/21) 4 mo ago (09/08/21) 5 mo ago (08/11/21)  CA 27.29 0.0 - 38.6 U/mL 33.8  23.9 CM  25.4 CM  26.7 CM  23.0 CM  23.2 CM  29.0 CM   Comment: (NOTE)  [employer] Immunochemiluminometric Methodology [facility])  Values obtained with different assay methods or kits cannot be used  interchangeably. Results cannot be interpreted as absolute evidence  of the presence or absence of malignant disease.  Performed At: [facility]  [address] [place], [place] [id number]  Rush Farmer MD FI:[id number]   Resulting Agency  [place] CL[facility] [place] [facility] [place] [facility] [place] [facility] [place] [facility] [place] [facility] [place] [facility]         Specimen Collected: 02/02/22 10:09 Last Resulted: 02/03/22 02:35      Lab Flowsheet    Order Details    View Encounter    Lab and Collection Details    Routing    Result History    View All Conversations on this Encounter      CM=Additional comments      Result Care Coordination   Patient Communication   Add Comments   Seen Back to Top           Contains abnormal data Comprehensive metabolic panel Order: [id number] Status: Final result    Visible to patient: Yes (seen)    Next appt: 03/03/2022 at 02:45 PM in Oncology ([facility])    Dx: Carcinoma of lower-outer quadrant of ...    0 Result Notes           Component Ref Range & Units 2 d ago (02/02/22) 1 mo ago (01/05/22) 1 mo ago (12/08/21) 2 mo ago (11/10/21) 4 mo ago (10/06/21) 4 mo ago (09/15/21) 4 mo ago (09/08/21)  Sodium 135 - 145 mmol/L 133 Low   129 Low   134 Low   134 Low   133 Low   134 Low   132 Low    Potassium 3.5 - 5.1 mmol/L 4.0  3.9  3.5  3.4 Low   4.2  3.8  3.7   Chloride 98 - 111 mmol/L 103  99  99  103  101  103  98   CO2 22 -  32 mmol/L '22  23  24  24  24  23  28   '$ Glucose, Bld 70 - 99 mg/dL 225 High   230 High  CM  279 High  CM  115 High  CM  123 High  CM  121 High  CM  200 High  CM   Comment: Glucose reference range applies only to samples taken after fasting for at least 8 hours.  BUN 8 - 23 mg/dL '14  20  16  18  18  17  20   '$ Creatinine, Ser 0.44 - 1.00 mg/dL 1.12 High   1.43 High   0.97  1.00  0.92  0.89  1.06 High    Calcium 8.9 - 10.3 mg/dL 8.8 Low   9.5  10.1  9.6  10.2  9.4  10.4 High    Total Protein 6.5 - 8.1 g/dL 7.1  6.8  7.1  7.0  7.5   7.3   Albumin 3.5 - 5.0 g/dL 3.6  3.5  3.7  3.7  3.9   3.7   AST 15 - 41 U/L 44 High   38  '30  30  29   29   '$ ALT 0 - 44 U/L '29  27  24  22  23   23   '$ Alkaline Phosphatase 38 - 126 U/L 62  79  104  76  99   105   Total Bilirubin 0.3 - 1.2 mg/dL 0.6  0.6  0.5  0.6  0.4   0.6   GFR, Estimated >60 mL/min 54 Low   40 Low  CM  >60 CM  >60 CM  >60 CM  >60 CM  58 Low  CM   Comment: (NOTE)  Calculated using the CKD-EPI Creatinine Equation (2021)   Anion gap 5 - '15 8  7 '$ CM  11 CM  7 CM  8 CM  8 CM  6 CM   Comment: Performed at [facility], [address] [place], [place] [address]  Resulting Agency  [facility] CLIN LAB [place] [facility] LAB [place] [facility] [place] [facility] [place] [facility] [place] [facility] [place] [facility]         Specimen  Collected: 02/02/22 10:09 Last Resulted: 02/02/22 10:43

## 2022-03-03 ENCOUNTER — Inpatient Hospital Stay: Payer: PPO | Attending: Internal Medicine

## 2022-03-03 ENCOUNTER — Inpatient Hospital Stay: Payer: PPO

## 2022-03-03 ENCOUNTER — Inpatient Hospital Stay: Payer: PPO | Admitting: Internal Medicine

## 2022-03-03 ENCOUNTER — Encounter: Payer: Self-pay | Admitting: Internal Medicine

## 2022-03-03 DIAGNOSIS — Z170 Estrogen receptor positive status [ER+]: Secondary | ICD-10-CM

## 2022-03-03 DIAGNOSIS — Z7984 Long term (current) use of oral hypoglycemic drugs: Secondary | ICD-10-CM | POA: Insufficient documentation

## 2022-03-03 DIAGNOSIS — C50511 Malignant neoplasm of lower-outer quadrant of right female breast: Secondary | ICD-10-CM | POA: Insufficient documentation

## 2022-03-03 DIAGNOSIS — E871 Hypo-osmolality and hyponatremia: Secondary | ICD-10-CM | POA: Insufficient documentation

## 2022-03-03 DIAGNOSIS — Z9013 Acquired absence of bilateral breasts and nipples: Secondary | ICD-10-CM | POA: Diagnosis not present

## 2022-03-03 DIAGNOSIS — R21 Rash and other nonspecific skin eruption: Secondary | ICD-10-CM | POA: Insufficient documentation

## 2022-03-03 DIAGNOSIS — Z794 Long term (current) use of insulin: Secondary | ICD-10-CM | POA: Insufficient documentation

## 2022-03-03 DIAGNOSIS — Z79899 Other long term (current) drug therapy: Secondary | ICD-10-CM | POA: Insufficient documentation

## 2022-03-03 DIAGNOSIS — E119 Type 2 diabetes mellitus without complications: Secondary | ICD-10-CM | POA: Insufficient documentation

## 2022-03-03 DIAGNOSIS — C7800 Secondary malignant neoplasm of unspecified lung: Secondary | ICD-10-CM | POA: Diagnosis present

## 2022-03-03 DIAGNOSIS — Z5111 Encounter for antineoplastic chemotherapy: Secondary | ICD-10-CM | POA: Diagnosis present

## 2022-03-03 DIAGNOSIS — Z7901 Long term (current) use of anticoagulants: Secondary | ICD-10-CM | POA: Diagnosis not present

## 2022-03-03 LAB — CBC WITH DIFFERENTIAL/PLATELET
Abs Immature Granulocytes: 0.02 10*3/uL (ref 0.00–0.07)
Basophils Absolute: 0.1 10*3/uL (ref 0.0–0.1)
Basophils Relative: 2 %
Eosinophils Absolute: 0.1 10*3/uL (ref 0.0–0.5)
Eosinophils Relative: 2 %
HCT: 31.6 % — ABNORMAL LOW (ref 36.0–46.0)
Hemoglobin: 10.7 g/dL — ABNORMAL LOW (ref 12.0–15.0)
Immature Granulocytes: 1 %
Lymphocytes Relative: 42 %
Lymphs Abs: 1.4 10*3/uL (ref 0.7–4.0)
MCH: 33.8 pg (ref 26.0–34.0)
MCHC: 33.9 g/dL (ref 30.0–36.0)
MCV: 99.7 fL (ref 80.0–100.0)
Monocytes Absolute: 0.2 10*3/uL (ref 0.1–1.0)
Monocytes Relative: 7 %
Neutro Abs: 1.6 10*3/uL — ABNORMAL LOW (ref 1.7–7.7)
Neutrophils Relative %: 46 %
Platelets: 197 10*3/uL (ref 150–400)
RBC: 3.17 MIL/uL — ABNORMAL LOW (ref 3.87–5.11)
RDW: 13.7 % (ref 11.5–15.5)
WBC: 3.4 10*3/uL — ABNORMAL LOW (ref 4.0–10.5)
nRBC: 0 % (ref 0.0–0.2)

## 2022-03-03 LAB — COMPREHENSIVE METABOLIC PANEL
ALT: 22 U/L (ref 0–44)
AST: 33 U/L (ref 15–41)
Albumin: 3.4 g/dL — ABNORMAL LOW (ref 3.5–5.0)
Alkaline Phosphatase: 75 U/L (ref 38–126)
Anion gap: 9 (ref 5–15)
BUN: 13 mg/dL (ref 8–23)
CO2: 22 mmol/L (ref 22–32)
Calcium: 8.9 mg/dL (ref 8.9–10.3)
Chloride: 106 mmol/L (ref 98–111)
Creatinine, Ser: 1.07 mg/dL — ABNORMAL HIGH (ref 0.44–1.00)
GFR, Estimated: 57 mL/min — ABNORMAL LOW (ref >60)
Glucose, Bld: 91 mg/dL (ref 70–99)
Potassium: 3.7 mmol/L (ref 3.5–5.1)
Sodium: 137 mmol/L (ref 135–145)
Total Bilirubin: 0.6 mg/dL (ref 0.3–1.2)
Total Protein: 6.8 g/dL (ref 6.5–8.1)

## 2022-03-03 LAB — IRON AND TIBC
Iron: 77 ug/dL (ref 28–170)
Saturation Ratios: 20 % (ref 10.4–31.8)
TIBC: 389 ug/dL (ref 250–450)
UIBC: 312 ug/dL

## 2022-03-03 LAB — FERRITIN: Ferritin: 110 ng/mL (ref 11–307)

## 2022-03-03 MED ORDER — FULVESTRANT 250 MG/5ML IM SOSY
500.0000 mg | PREFILLED_SYRINGE | Freq: Once | INTRAMUSCULAR | Status: DC
  Administered 2022-03-03: 500 mg via INTRAMUSCULAR
  Filled 2022-03-03: qty 10

## 2022-03-03 NOTE — Assessment & Plan Note (Addendum)
#  Recurrent breast cancer-ER positive PR negative ? HER-2/neu-currently on Faslodex plus abema. June 2nd, 2023- CT CAP-  No definitive evidence to suggest recurrent or metastatic disease in the chest, abdomen or pelvis.   # Continue Faslodex; Currently on  Abema 100 BID; Labs today-WBC-3.5; ANC-1.6; Hb-11- 12 platlets-N-  STABLE.Continue with abema at this time.   # Hyponatremia [133]/ elevated creatinine [1.2]-sec to poor hydration/diarrhea- ? abema- recommend increasing fluids/salt intake electrolytes. STABLE.  Continue albema.   # Hypomagnesia:   # DM- BG- 230- s/p endocrinology evaluation; with diet and medications- STABLE.   # recurrent UTI/ ? Suprapubic tenderness- S/p eval with urology- STABLE.  Monitor closely on Abema/with neutropenia- STABLE.   # Hypercalcemia: Ca 10.4-resolved- continue just vit D- STABLE.   # Skin rash- L LE more than R; bil UE minimal- recommend hydrocortisone cream as needed.  #DISPOSITION: **early AM appt # ADD iron studies/ferritin to today's labs # Faslodex today # Follow up in 4 weeksMD/ labs [CBC/CMP/Ca 27-29] Faslodex; --Dr.B

## 2022-03-03 NOTE — Progress Notes (Signed)
Has red spots on her arms and legs, not sure what it is coming from last few days.

## 2022-03-03 NOTE — Progress Notes (Signed)
.[facility] OFFICE PROGRESS NOTE  Patient Care Team: Ezequiel Kayser, MD as PCP - General (Internal Medicine) Neldon Mc, MD (General Surgery) Everlene Farrier, MD (Obstetrics and Gynecology) Noreene Filbert, MD Forest Gleason, MD (Unknown Physician Specialty) [name], MD as Consulting Physician (Hematology and Oncology)   Cancer Staging  Carcinoma of lower-outer quadrant of right breast in female, estrogen receptor positive [facility]) Staging form: Breast, AJCC 7th Edition - Pathologic: ypT1c,ypN2a, MX - Signed by Haywood Lasso, MD on 03/10/2012 Cancer stage: ypT1c,ypN2a, MX    Oncology History Overview Note  # DEC 2012- RIGHT BREAST CA T2 N2 M0 tumor from biopsy.  Estrogen receptor positive, Progesterone receptor positive.  Current receptor negative by FISH 2. Neoadjuvant chemotherapy started in December of 28 with Cytoxan Adriamycin 3. Started on Taxol weekly chemotherapy. 4. Patient finished 12  cycles of Taxol chemotherapy in May of 2013.     5. Status post right modified radical mastectomy [Dr.Bowers; [place]] June of 2013, ypT1c  yp N2  MO. started also on Lerazole    7. Radiation therapy to the right breast (September of 2013).  Lymph node was positive for HER-2 receptor gene amplification of 2.22.  Will proceed with Herceptin treatment starting in September of 2013.   8.Patient has finished Herceptin (maintenance therapy) in August of 2014 8. Start patient on letrozole from November, 2013. 9. Patient started on Herceptin in September 2013.    10Patient finished 12 months of Herceptin therapy on August, 2014  # 6. Status post left side prophylactic mastectomy.  #Late MAY 2018-RECURRENCE BREAST CA- ER positive/PR negative; ?? HER-2/neu- [biopsy- proven-mediastinal lymph node; in suff for her 2 testing].  [elevated Tumor marker- CT/PET- uptake in Right Mediastinal LN; Sternum [June 2018 EBUS- Dr.Kasa]   # March 10 2017- faslodex + Abema; OCT 5th  CT-PR of mediastinal LN [consent]  #August 2020-left chest wall nodule biopsy benign  #August 2019- DVT-right upper extremity; Eliquis; stop end of March 2021 [repeat ultrasound negative/patient preference]; # JAN 2022-Cirrhosis- ? On CT scan-FEB 2022MRI liver- Negative fro overt cirrhosis --------------------------------------------------------------- -    DIAGNOSIS: [ BREAST CANCER- ER/PR/HER2 NEU POS  STAGE:  4   ;GOALS: PALLIATIVE  CURRENT/MOST RECENT THERAPY - ABEMA + FASLODEX    Carcinoma of lower-outer quadrant of right breast in female, estrogen receptor positive ([place])   INTERVAL HISTORY: Alone.  Ambulating independently.  Kimberly [name] 66 y.o.  female pleasant patient above history of metastatic ER PR positive HER-2/neu positive breast cancer currently on abema+ Faslodex is here for follow-up.  Patient complains of mild rash on the left lower extremity.  Stated that she might have had a mosquito bite.  Also rash on the upper extremities and on the right lower leg.  Patient stated that she was recently started on magnesium as her levels were low at 1.3 by PCP.  No further episodes of UTI.  Patient denies any nausea vomiting or diarrhea.  No headaches.  No new lumps or bumps.    Review of Systems  Constitutional:  Positive for malaise/fatigue. Negative for chills, diaphoresis, fever and weight loss.  HENT:  Negative for nosebleeds and sore throat.   Eyes:  Negative for double vision.  Respiratory:  Negative for cough, hemoptysis, sputum production, shortness of breath and wheezing.   Cardiovascular:  Negative for chest pain, palpitations, orthopnea and leg swelling.  Gastrointestinal:  Negative for abdominal pain, blood in stool, constipation, heartburn, melena, nausea and vomiting.  Musculoskeletal:  Positive for back pain  and joint pain.  Neurological:  Negative for dizziness, tingling, focal weakness, weakness and headaches.  Endo/Heme/Allergies:  Does not bruise/bleed  easily.  Psychiatric/Behavioral:  Negative for depression. The patient is not nervous/anxious and does not have insomnia.       PAST MEDICAL HISTORY :  Past Medical History:  Diagnosis Date   Arthritis    Cancer of lower-outer quadrant of female breast ([place]) 08/06/2011   RIGHT, chemo and bilateral mastectomies.   High cholesterol    History of kidney stones    Hx of bilateral breast implants    Hypertension    Lung metastases 20118   chemo pills and faslidex shots.   PONV (postoperative nausea and vomiting)    Type II diabetes mellitus ([place])    fasting 140-150   Vertigo    LAST WEEK   Vertigo 01/2017    PAST SURGICAL HISTORY :   Past Surgical History:  Procedure Laterality Date   BREAST BIOPSY  07/2011, 2020   right   BREAST RECONSTRUCTION  03/07/2012   Procedure: BREAST RECONSTRUCTION;  Surgeon: Crissie Reese, MD;  Location: [place];  Service: Plastics;  Laterality: Left;  BREAST RECONSTRUCTION WITH PLACEMENT OF TISSUE EXPANDER TO LEFT BREAST   BREAST RECONSTRUCTION  02/06/2013   CESAREAN SECTION  [id number]   ENDOBRONCHIAL ULTRASOUND N/A 02/24/2017   Procedure: ENDOBRONCHIAL ULTRASOUND;  Surgeon: [name], MD;  Location: [facility];  Service: Cardiopulmonary;  Laterality: N/A;   LATISSIMUS FLAP TO BREAST Right 02/06/2013   Procedure: LATISSIMUS FLAP TO RIGHT BREAST WITH IMPLANT;  Surgeon: Crissie Reese, MD;  Location: [place];  Service: Plastics;  Laterality: Right;   MASTECTOMY  03/07/12   modified right; total left   MODIFIED MASTECTOMY  03/07/2012   Procedure: MODIFIED MASTECTOMY;  Surgeon: Haywood Lasso, MD;  Location: [place];  Service: General;  Laterality: Right;   PORTACATH PLACEMENT  07/2011   SCAR REVISION  03/30/2012   Procedure: SCAR REVISION;  Surgeon: Haywood Lasso, MD;  Location: [place];  Service: General;  Laterality: Right;  CLOSURE OF MASTECTOMY INCISION   WISDOM TOOTH EXTRACTION      FAMILY HISTORY :   Family History  Problem Relation  Age of Onset   Breast cancer Mother    Diabetes Father     SOCIAL HISTORY:   Social History   Tobacco Use   Smoking status: Never   Smokeless tobacco: Never  Vaping Use   Vaping Use: Never used  Substance Use Topics   Alcohol use: No   Drug use: No    ALLERGIES:  is allergic to sulfa antibiotics.  MEDICATIONS:  Current Outpatient Medications  Medication Sig Dispense Refill   atorvastatin (LIPITOR) 20 MG tablet Take 1 tablet (20 mg total) by mouth daily. 90 tablet 3   Cholecalciferol 50 MCG (2000 UT) CAPS Take 3 capsules daily for 3 months, then reduce to 1 capsule daily thereafter for Vitamin D Deficiency.     clotrimazole-betamethasone (LOTRISONE) cream SMARTSIG:Topical Morning-Evening     fulvestrant (FASLODEX) 250 MG/5ML injection Inject into the muscle every 30 (thirty) days. One injection each buttock over 1-2 minutes. Warm prior to use.     glipiZIDE (GLUCOTROL XL) 10 MG 24 hr tablet Take 1 tablet (10 mg total) by mouth 2 (two) times daily. 180 tablet 3   insulin aspart (NOVOLOG FLEXPEN) 100 UNIT/ML FlexPen Inject 4 Units into the skin 3 (three) times daily with meals. 15 mL 0   Insulin Detemir (LEVEMIR) 100 UNIT/ML  Pen Inject 20 Units into the skin at bedtime. 15 mL 0   levothyroxine (SYNTHROID) 25 MCG tablet Take 1 tablet (25 mcg total) by mouth daily before breakfast. 90 tablet 1   lisinopril (ZESTRIL) 10 MG tablet Take 10 mg by mouth daily.     loperamide (IMODIUM A-D) 2 MG tablet Take 2 mg by mouth 4 (four) times daily as needed for diarrhea or loose stools.     magnesium oxide (MAG-OX) 400 MG tablet Take 1 tablet by mouth daily.     montelukast (SINGULAIR) 10 MG tablet TAKE 1 TABLET BY MOUTH EVERYDAY AT BEDTIME 30 tablet 0   ondansetron (ZOFRAN-ODT) 4 MG disintegrating tablet Take 1 tablet (4 mg total) by mouth every 8 (eight) hours as needed for nausea or vomiting. 20 tablet 3   TRULICITY 3.66 QH/4.7ML SOPN SMARTSIG:0.5 Milliliter(s) SUB-Q Once a Week     VERZENIO  100 MG tablet TAKE 1 TABLET BY MOUTH TWICE DAILY. DO NOT CHEW, CRUSH, OR SPLIT TABLETS BEFORE SWALLOWING. 56 tablet 0   No current facility-administered medications for this visit.   Facility-Administered Medications Ordered in Other Visits  Medication Dose Route Frequency Provider Last Rate Last Admin   fulvestrant (FASLODEX) injection 500 mg  500 mg Intramuscular Once [name], MD        PHYSICAL EXAMINATION: ECOG PERFORMANCE STATUS: 1 - Symptomatic but completely ambulatory  BP 115/60 (BP Location: Left Arm, Patient Position: Sitting, Cuff Size: Normal)   Pulse (!) 105   Temp 98.6 F (37 C) (Tympanic)   Ht 5' 2.5" (1.588 m)   Wt 174 lb 9.6 oz (79.2 kg)   SpO2 97%   BMI 31.43 kg/m   Filed Weights   03/03/22 1442  Weight: 174 lb 9.6 oz (79.2 kg)     Physical Exam Constitutional:      Comments: She is alone.   HENT:     Head: Normocephalic and atraumatic.     Mouth/Throat:     Pharynx: No oropharyngeal exudate.  Eyes:     Pupils: Pupils are equal, round, and reactive to light.  Cardiovascular:     Rate and Rhythm: Normal rate and regular rhythm.  Pulmonary:     Effort: Pulmonary effort is normal. No respiratory distress.     Breath sounds: Normal breath sounds. No wheezing.  Abdominal:     General: Bowel sounds are normal. There is no distension.     Palpations: Abdomen is soft. There is no mass.     Tenderness: There is no abdominal tenderness. There is no guarding or rebound.  Musculoskeletal:        General: No tenderness. Normal range of motion.     Cervical back: Normal range of motion and neck supple.     Comments: Approximately 1 cm hard nodule felt in the anterior chest wall/left parasternal.  Skin:    General: Skin is warm.  Neurological:     Mental Status: She is alert and oriented to person, place, and time.  Psychiatric:        Mood and Affect: Affect normal.    Maculopapular rash mostly on the left lower extremity-mild on left lower;  bilateral upper extremities  LABORATORY DATA:  I have reviewed the data as listed    Component Value Date/Time   NA 137 03/03/2022 1421   NA 135 11/28/2014 1401   K 3.7 03/03/2022 1421   K 4.7 11/28/2014 1401   CL 106 03/03/2022 1421   CL 99 (L) 11/28/2014  1401   CO2 22 03/03/2022 1421   CO2 28 11/28/2014 1401   GLUCOSE 91 03/03/2022 1421   GLUCOSE 215 (H) 11/28/2014 1401   BUN 13 03/03/2022 1421   BUN 16 11/28/2014 1401   CREATININE 1.07 (H) 03/03/2022 1421   CREATININE 0.96 11/28/2014 1401   CALCIUM 8.9 03/03/2022 1421   CALCIUM 9.9 11/28/2014 1401   PROT 6.8 03/03/2022 1421   PROT 7.6 11/28/2014 1401   ALBUMIN 3.4 (L) 03/03/2022 1421   ALBUMIN 4.5 11/28/2014 1401   AST 33 03/03/2022 1421   AST 26 11/28/2014 1401   ALT 22 03/03/2022 1421   ALT 28 11/28/2014 1401   ALKPHOS 75 03/03/2022 1421   ALKPHOS 78 11/28/2014 1401   BILITOT 0.6 03/03/2022 1421   BILITOT 0.5 11/28/2014 1401   GFRNONAA 57 (L) 03/03/2022 1421   GFRNONAA >60 11/28/2014 1401   GFRAA >60 05/30/2020 1022   GFRAA >60 11/28/2014 1401    No results found for: "SPEP", "UPEP"  Lab Results  Component Value Date   WBC 3.4 (L) 03/03/2022   NEUTROABS 1.6 (L) 03/03/2022   HGB 10.7 (L) 03/03/2022   HCT 31.6 (L) 03/03/2022   MCV 99.7 03/03/2022   PLT 197 03/03/2022      Chemistry      Component Value Date/Time   NA 137 03/03/2022 1421   NA 135 11/28/2014 1401   K 3.7 03/03/2022 1421   K 4.7 11/28/2014 1401   CL 106 03/03/2022 1421   CL 99 (L) 11/28/2014 1401   CO2 22 03/03/2022 1421   CO2 28 11/28/2014 1401   BUN 13 03/03/2022 1421   BUN 16 11/28/2014 1401   CREATININE 1.07 (H) 03/03/2022 1421   CREATININE 0.96 11/28/2014 1401      Component Value Date/Time   CALCIUM 8.9 03/03/2022 1421   CALCIUM 9.9 11/28/2014 1401   ALKPHOS 75 03/03/2022 1421   ALKPHOS 78 11/28/2014 1401   AST 33 03/03/2022 1421   AST 26 11/28/2014 1401   ALT 22 03/03/2022 1421   ALT 28 11/28/2014 1401   BILITOT 0.6  03/03/2022 1421   BILITOT 0.5 11/28/2014 1401       RADIOGRAPHIC STUDIES: I have personally reviewed the radiological images as listed and agreed with the findings in the report. No results found.   ASSESSMENT & PLAN:  Carcinoma of lower-outer quadrant of right breast in female, estrogen receptor positive ([place])  # Recurrent breast cancer-ER positive PR negative ? HER-2/neu-currently on Faslodex plus abema. June 2nd, 2023- CT CAP-  No definitive evidence to suggest recurrent or metastatic disease in the chest, abdomen or pelvis.    # Continue Faslodex; Currently on  Abema 100 BID; Labs today-WBC-3.5; ANC-1.6; Hb-11- 12 platlets-N-  STABLE.Continue with abema at this time.   # Hyponatremia [133]/ elevated creatinine [1.2]-sec to poor hydration/diarrhea- ? abema- recommend increasing fluids/salt intake electrolytes. STABLE.  Continue albema.   # Hypomagnesia:   # DM- BG- 230- s/p endocrinology evaluation; with diet and medications- STABLE.   # recurrent UTI/ ? Suprapubic tenderness- S/p eval with urology- STABLE.  Monitor closely on Abema/with neutropenia- STABLE.   # Hypercalcemia: Ca 10.4-resolved- continue just vit D- STABLE.   # Skin rash- L LE more than R; bil UE minimal- recommend hydrocortisone cream as needed.  #DISPOSITION: **early AM appt # ADD iron studies/ferritin to today's labs # Faslodex today # Follow up in 4 weeksMD/ labs [CBC/CMP/Ca 27-29] Faslodex; --Dr.B         Orders Placed  This Encounter  Procedures   Iron and TIBC    Standing Status:   Future    Number of Occurrences:   1    Standing Expiration Date:   03/04/2023   Ferritin    Standing Status:   Future    Number of Occurrences:   1    Standing Expiration Date:   03/04/2023      All questions were answered. The patient knows to call the clinic with any problems, questions or concerns.      [name], MD 03/03/2022 3:50 PM

## 2022-03-04 LAB — CANCER ANTIGEN 27.29: CA 27.29: 25.9 U/mL (ref 0.0–38.6)

## 2022-03-15 ENCOUNTER — Other Ambulatory Visit: Payer: Self-pay

## 2022-03-15 ENCOUNTER — Other Ambulatory Visit: Payer: Self-pay | Admitting: Internal Medicine

## 2022-03-15 ENCOUNTER — Encounter: Payer: Self-pay | Admitting: Internal Medicine

## 2022-03-15 DIAGNOSIS — C50511 Malignant neoplasm of lower-outer quadrant of right female breast: Secondary | ICD-10-CM

## 2022-03-16 ENCOUNTER — Other Ambulatory Visit: Payer: Self-pay | Admitting: Internal Medicine

## 2022-03-16 ENCOUNTER — Other Ambulatory Visit (HOSPITAL_COMMUNITY): Payer: Self-pay

## 2022-03-16 ENCOUNTER — Encounter: Payer: Self-pay | Admitting: Internal Medicine

## 2022-03-16 DIAGNOSIS — Z170 Estrogen receptor positive status [ER+]: Secondary | ICD-10-CM

## 2022-03-30 ENCOUNTER — Inpatient Hospital Stay: Payer: PPO | Attending: Internal Medicine

## 2022-03-30 ENCOUNTER — Other Ambulatory Visit: Payer: Self-pay

## 2022-03-30 ENCOUNTER — Telehealth: Payer: Self-pay

## 2022-03-30 ENCOUNTER — Encounter: Payer: Self-pay | Admitting: Internal Medicine

## 2022-03-30 ENCOUNTER — Inpatient Hospital Stay: Payer: PPO

## 2022-03-30 ENCOUNTER — Inpatient Hospital Stay: Payer: PPO | Admitting: Internal Medicine

## 2022-03-30 DIAGNOSIS — Z79899 Other long term (current) drug therapy: Secondary | ICD-10-CM | POA: Insufficient documentation

## 2022-03-30 DIAGNOSIS — R112 Nausea with vomiting, unspecified: Secondary | ICD-10-CM | POA: Insufficient documentation

## 2022-03-30 DIAGNOSIS — I10 Essential (primary) hypertension: Secondary | ICD-10-CM | POA: Diagnosis not present

## 2022-03-30 DIAGNOSIS — Z7901 Long term (current) use of anticoagulants: Secondary | ICD-10-CM | POA: Diagnosis not present

## 2022-03-30 DIAGNOSIS — Z170 Estrogen receptor positive status [ER+]: Secondary | ICD-10-CM

## 2022-03-30 DIAGNOSIS — E7800 Pure hypercholesterolemia, unspecified: Secondary | ICD-10-CM | POA: Diagnosis not present

## 2022-03-30 DIAGNOSIS — E871 Hypo-osmolality and hyponatremia: Secondary | ICD-10-CM | POA: Insufficient documentation

## 2022-03-30 DIAGNOSIS — E119 Type 2 diabetes mellitus without complications: Secondary | ICD-10-CM | POA: Insufficient documentation

## 2022-03-30 DIAGNOSIS — Z5111 Encounter for antineoplastic chemotherapy: Secondary | ICD-10-CM | POA: Diagnosis present

## 2022-03-30 DIAGNOSIS — Z7984 Long term (current) use of oral hypoglycemic drugs: Secondary | ICD-10-CM | POA: Insufficient documentation

## 2022-03-30 DIAGNOSIS — Z9013 Acquired absence of bilateral breasts and nipples: Secondary | ICD-10-CM | POA: Diagnosis not present

## 2022-03-30 DIAGNOSIS — Z794 Long term (current) use of insulin: Secondary | ICD-10-CM | POA: Insufficient documentation

## 2022-03-30 DIAGNOSIS — C7800 Secondary malignant neoplasm of unspecified lung: Secondary | ICD-10-CM | POA: Insufficient documentation

## 2022-03-30 DIAGNOSIS — C50511 Malignant neoplasm of lower-outer quadrant of right female breast: Secondary | ICD-10-CM | POA: Insufficient documentation

## 2022-03-30 LAB — CBC WITH DIFFERENTIAL/PLATELET
Abs Immature Granulocytes: 0.01 10*3/uL (ref 0.00–0.07)
Basophils Absolute: 0.1 10*3/uL (ref 0.0–0.1)
Basophils Relative: 2 %
Eosinophils Absolute: 0.1 10*3/uL (ref 0.0–0.5)
Eosinophils Relative: 2 %
HCT: 32.9 % — ABNORMAL LOW (ref 36.0–46.0)
Hemoglobin: 11.4 g/dL — ABNORMAL LOW (ref 12.0–15.0)
Immature Granulocytes: 0 %
Lymphocytes Relative: 40 %
Lymphs Abs: 1.2 10*3/uL (ref 0.7–4.0)
MCH: 34.1 pg — ABNORMAL HIGH (ref 26.0–34.0)
MCHC: 34.7 g/dL (ref 30.0–36.0)
MCV: 98.5 fL (ref 80.0–100.0)
Monocytes Absolute: 0.2 10*3/uL (ref 0.1–1.0)
Monocytes Relative: 8 %
Neutro Abs: 1.4 10*3/uL — ABNORMAL LOW (ref 1.7–7.7)
Neutrophils Relative %: 48 %
Platelets: 199 10*3/uL (ref 150–400)
RBC: 3.34 MIL/uL — ABNORMAL LOW (ref 3.87–5.11)
RDW: 13.7 % (ref 11.5–15.5)
WBC: 2.9 10*3/uL — ABNORMAL LOW (ref 4.0–10.5)
nRBC: 0 % (ref 0.0–0.2)

## 2022-03-30 LAB — COMPREHENSIVE METABOLIC PANEL
ALT: 80 U/L — ABNORMAL HIGH (ref 0–44)
AST: 161 U/L — ABNORMAL HIGH (ref 15–41)
Albumin: 3.6 g/dL (ref 3.5–5.0)
Alkaline Phosphatase: 95 U/L (ref 38–126)
Anion gap: 8 (ref 5–15)
BUN: 15 mg/dL (ref 8–23)
CO2: 25 mmol/L (ref 22–32)
Calcium: 9.6 mg/dL (ref 8.9–10.3)
Chloride: 102 mmol/L (ref 98–111)
Creatinine, Ser: 1.1 mg/dL — ABNORMAL HIGH (ref 0.44–1.00)
GFR, Estimated: 55 mL/min — ABNORMAL LOW (ref >60)
Glucose, Bld: 172 mg/dL — ABNORMAL HIGH (ref 70–99)
Potassium: 3.5 mmol/L (ref 3.5–5.1)
Sodium: 135 mmol/L (ref 135–145)
Total Bilirubin: 0.7 mg/dL (ref 0.3–1.2)
Total Protein: 7.2 g/dL (ref 6.5–8.1)

## 2022-03-30 LAB — URINALYSIS, DIPSTICK ONLY
Bilirubin Urine: NEGATIVE
Glucose, UA: 100 mg/dL — AB
Ketones, ur: NEGATIVE mg/dL
Nitrite: NEGATIVE
Protein, ur: 30 mg/dL — AB
Specific Gravity, Urine: >1.03 — ABNORMAL HIGH (ref 1.005–1.030)
pH: 5.5 (ref 5.0–8.0)

## 2022-03-30 MED ORDER — FULVESTRANT 250 MG/5ML IM SOSY
500.0000 mg | PREFILLED_SYRINGE | Freq: Once | INTRAMUSCULAR | Status: AC
  Administered 2022-03-30: 500 mg via INTRAMUSCULAR
  Filled 2022-03-30: qty 10

## 2022-03-30 MED ORDER — ABEMACICLIB 100 MG PO TABS: ORAL_TABLET | ORAL | 6 refills | Status: DC

## 2022-03-30 NOTE — Assessment & Plan Note (Addendum)
#  Recurrent breast cancer-ER positive PR negative ? HER-2/neu-currently on Faslodex plus abema. June 2nd, 2023- CT CAP-  No definitive evidence to suggest recurrent or metastatic disease in the chest, abdomen or pelvis.   # Continue Faslodex; Currently on  Abema 100 BID; Labs today-WBC-2.9 ; ANC-1.4 ; Hb-11-platlets-N-  STABLE. HOLD with abema at this time given LFTs-see below.  # Nausea/vomitting; no fevers- increased LFTs [AST/ALT; normal T.bili]; question viral gastroenteritis? Check UA/culture.  Recommend holding Abema given elevated LFTs/   # Hyponatremia [133]/ elevated creatinine [1.2]-sec to poor hydration/diarrhea- ? abema- recommend increasing fluids/salt intake electrolytes. STABLE.    # Hypomagnesia: STABLE. Monitor for now.   # DM- BG- 172- s/p endocrinology evaluation; with diet and medications- STABLE.   # recurrent UTI/ ? Suprapubic tenderness- S/p eval with urology- STABLE.  Monitor closely on Abema/with neutropenia-await UA culture.  # Hypercalcemia: Ca 10.4-resolved- continue just vit D- STABLE.   #DISPOSITION: **early AM appt # Faslodex today # UA/ culture today # 1 week- CMP  # Follow up in 5 weeksMD/ labs [CBC/CMP/Ca 27-29] Faslodex; --Dr.B

## 2022-03-30 NOTE — Progress Notes (Unsigned)
Vomiting last night.   C/o bloating, urine frequency, thinks may be getting infection.  Pt would like to know if she can get a rx for her verzenio for more than a month at a time? She is calling every month for a refill.

## 2022-03-30 NOTE — Telephone Encounter (Signed)
Per secure chats:  Me: Pt states her ins is getting charged for her faslodex, she is on the assistance program for this. Who should I talk to?  Estelle Grumbles: She should not be, she is on assistance. Juliann Pulse is out the first part of the week, let me add Freda Munro she may know  Jarvis Morgan: Hello!  There has been several emails recently re; this patient and Faslodex.  The last message on 7/28 was all 2023 accounts have been corrected to reflect the drug replacement. The patient came off our active drug replacement list on 08/29/21.  We did not get notice of re-enrollment for 2023.  Since the recent emails came in, I have added her back to our active patient list eff thru 08/29/22.  This should prevent this from happening in the future.  I just sent Harold Barban, Customer Service Lead, to advise all 2023 accounts have been corrected.  And to make aware of our conversations today.  If there is anything else I can do, please reach out.  Apolonio Schneiders Rakes: Is there a way to show it is approved indefinitely? There's no expiration so maybe that is why the confusion initially. Thank you for taking care of it [facility]!  Shelia Fogleman: yes I will change it to indefinitely.   Apolonio Schneiders Rakes: Well, the manufacturer is stopping that particular med at the end of the year. I'm sorry, for her that would be perfect like it is. Sonia Baller I did notify the MD last month that they are d/c assistance on the Faslodex.

## 2022-03-30 NOTE — Progress Notes (Unsigned)
.[facility] OFFICE PROGRESS NOTE  Patient Care Team: Ezequiel Kayser, MD as PCP - General (Internal Medicine) Neldon Mc, MD (General Surgery) Everlene Farrier, MD (Obstetrics and Gynecology) Noreene Filbert, MD Forest Gleason, MD (Unknown Physician Specialty) [name], MD as Consulting Physician (Hematology and Oncology)   Cancer Staging  Carcinoma of lower-outer quadrant of right breast in female, estrogen receptor positive [facility]) Staging form: Breast, AJCC 7th Edition - Pathologic: ypT1c,ypN2a, MX - Signed by Haywood Lasso, MD on 03/10/2012 Cancer stage: ypT1c,ypN2a, MX    Oncology History Overview Note  # DEC 2012- RIGHT BREAST CA T2 N2 M0 tumor from biopsy.  Estrogen receptor positive, Progesterone receptor positive.  Current receptor negative by FISH 2. Neoadjuvant chemotherapy started in December of 28 with Cytoxan Adriamycin 3. Started on Taxol weekly chemotherapy. 4. Patient finished 12  cycles of Taxol chemotherapy in May of 2013.     5. Status post right modified radical mastectomy [Dr.Bowers; [place]] June of 2013, ypT1c  yp N2  MO. started also on Lerazole    7. Radiation therapy to the right breast (September of 2013).  Lymph node was positive for HER-2 receptor gene amplification of 2.22.  Will proceed with Herceptin treatment starting in September of 2013.   8.Patient has finished Herceptin (maintenance therapy) in August of 2014 8. Start patient on letrozole from November, 2013. 9. Patient started on Herceptin in September 2013.    10Patient finished 12 months of Herceptin therapy on August, 2014  # 6. Status post left side prophylactic mastectomy.  #Late MAY 2018-RECURRENCE BREAST CA- ER positive/PR negative; ?? HER-2/neu- [biopsy- proven-mediastinal lymph node; in suff for her 2 testing].  [elevated Tumor marker- CT/PET- uptake in Right Mediastinal LN; Sternum [June 2018 EBUS- Dr.Kasa]   # March 10 2017- faslodex + Abema; OCT 5th  CT-PR of mediastinal LN [consent]  #August 2020-left chest wall nodule biopsy benign  #August 2019- DVT-right upper extremity; Eliquis; stop end of March 2021 [repeat ultrasound negative/patient preference]; # JAN 2022-Cirrhosis- ? On CT scan-FEB 2022MRI liver- Negative fro overt cirrhosis --------------------------------------------------------------- -    DIAGNOSIS: [ BREAST CANCER- ER/PR/HER2 NEU POS  STAGE:  4   ;GOALS: PALLIATIVE  CURRENT/MOST RECENT THERAPY - ABEMA + FASLODEX    Carcinoma of lower-outer quadrant of right breast in female, estrogen receptor positive ([place])   INTERVAL HISTORY: Alone.  Ambulating independently.  Kimberly [name] 66 y.o.  female pleasant patient above history of metastatic ER PR positive HER-2/neu positive breast cancer currently on abema+ Faslodex is here for follow-up.  Patient complains of mild rash on the left lower extremity.  Stated that she might have had a mosquito bite.  Also rash on the upper extremities and on the right lower leg.  Patient stated that she was recently started on magnesium as her levels were low at 1.3 by PCP.  No further episodes of UTI.  Patient denies any nausea vomiting or diarrhea.  No headaches.  No new lumps or bumps.    Review of Systems  Constitutional:  Positive for malaise/fatigue. Negative for chills, diaphoresis, fever and weight loss.  HENT:  Negative for nosebleeds and sore throat.   Eyes:  Negative for double vision.  Respiratory:  Negative for cough, hemoptysis, sputum production, shortness of breath and wheezing.   Cardiovascular:  Negative for chest pain, palpitations, orthopnea and leg swelling.  Gastrointestinal:  Negative for abdominal pain, blood in stool, constipation, heartburn, melena, nausea and vomiting.  Musculoskeletal:  Positive for back pain  and joint pain.  Neurological:  Negative for dizziness, tingling, focal weakness, weakness and headaches.  Endo/Heme/Allergies:  Does not bruise/bleed  easily.  Psychiatric/Behavioral:  Negative for depression. The patient is not nervous/anxious and does not have insomnia.       PAST MEDICAL HISTORY :  Past Medical History:  Diagnosis Date  . Arthritis   . Cancer of lower-outer quadrant of female breast ([place]) 08/06/2011   RIGHT, chemo and bilateral mastectomies.  . High cholesterol   . History of kidney stones   . Hx of bilateral breast implants   . Hypertension   . Lung metastases 20118   chemo pills and faslidex shots.  Marland Kitchen PONV (postoperative nausea and vomiting)   . Type II diabetes mellitus ([facility])    fasting 140-150  . Vertigo    LAST WEEK  . Vertigo 01/2017    PAST SURGICAL HISTORY :   Past Surgical History:  Procedure Laterality Date  . BREAST BIOPSY  07/2011, 2020   right  . BREAST RECONSTRUCTION  03/07/2012   Procedure: BREAST RECONSTRUCTION;  Surgeon: Crissie Reese, MD;  Location: [place];  Service: Plastics;  Laterality: Left;  BREAST RECONSTRUCTION WITH PLACEMENT OF TISSUE EXPANDER TO LEFT BREAST  . BREAST RECONSTRUCTION  02/06/2013  . CESAREAN SECTION  [id number]  . ENDOBRONCHIAL ULTRASOUND N/A 02/24/2017   Procedure: ENDOBRONCHIAL ULTRASOUND;  Surgeon: [name], MD;  Location: [facility];  Service: Cardiopulmonary;  Laterality: N/A;  . LATISSIMUS FLAP TO BREAST Right 02/06/2013   Procedure: LATISSIMUS FLAP TO RIGHT BREAST WITH IMPLANT;  Surgeon: Crissie Reese, MD;  Location: [place];  Service: Plastics;  Laterality: Right;  . MASTECTOMY  03/07/12   modified right; total left  . MODIFIED MASTECTOMY  03/07/2012   Procedure: MODIFIED MASTECTOMY;  Surgeon: Haywood Lasso, MD;  Location: [place];  Service: General;  Laterality: Right;  . PORTACATH PLACEMENT  07/2011  . SCAR REVISION  03/30/2012   Procedure: SCAR REVISION;  Surgeon: Haywood Lasso, MD;  Location: [place];  Service: General;  Laterality: Right;  CLOSURE OF MASTECTOMY INCISION  . WISDOM TOOTH EXTRACTION      FAMILY HISTORY :   Family  History  Problem Relation Age of Onset  . Breast cancer Mother   . Diabetes Father     SOCIAL HISTORY:   Social History   Tobacco Use  . Smoking status: Never  . Smokeless tobacco: Never  Vaping Use  . Vaping Use: Never used  Substance Use Topics  . Alcohol use: No  . Drug use: No    ALLERGIES:  is allergic to sulfa antibiotics.  MEDICATIONS:  Current Outpatient Medications  Medication Sig Dispense Refill  . atorvastatin (LIPITOR) 20 MG tablet Take 1 tablet (20 mg total) by mouth daily. 90 tablet 3  . Cholecalciferol 50 MCG (2000 UT) CAPS Take 3 capsules daily for 3 months, then reduce to 1 capsule daily thereafter for Vitamin D Deficiency.    . clotrimazole-betamethasone (LOTRISONE) cream SMARTSIG:Topical Morning-Evening    . fulvestrant (FASLODEX) 250 MG/5ML injection Inject into the muscle every 30 (thirty) days. One injection each buttock over 1-2 minutes. Warm prior to use.    Marland Kitchen glipiZIDE (GLUCOTROL XL) 10 MG 24 hr tablet Take 1 tablet (10 mg total) by mouth 2 (two) times daily. 180 tablet 3  . insulin aspart (NOVOLOG FLEXPEN) 100 UNIT/ML FlexPen Inject 4 Units into the skin 3 (three) times daily with meals. 15 mL 0  . Insulin Detemir (LEVEMIR) 100 UNIT/ML  Pen Inject 20 Units into the skin at bedtime. 15 mL 0  . levothyroxine (SYNTHROID) 25 MCG tablet Take 1 tablet (25 mcg total) by mouth daily before breakfast. 90 tablet 1  . lisinopril (ZESTRIL) 10 MG tablet Take 10 mg by mouth daily.    Marland Kitchen loperamide (IMODIUM A-D) 2 MG tablet Take 2 mg by mouth 4 (four) times daily as needed for diarrhea or loose stools.    . magnesium oxide (MAG-OX) 400 MG tablet Take 1 tablet by mouth daily.    . montelukast (SINGULAIR) 10 MG tablet TAKE 1 TABLET BY MOUTH EVERYDAY AT BEDTIME 30 tablet 0  . ondansetron (ZOFRAN-ODT) 4 MG disintegrating tablet Take 1 tablet (4 mg total) by mouth every 8 (eight) hours as needed for nausea or vomiting. 20 tablet 3  . TRULICITY 6.62 HU/7.6LY SOPN SMARTSIG:0.5  Milliliter(s) SUB-Q Once a Week    . VERZENIO 100 MG tablet TAKE 1 TABLET BY MOUTH TWICE DAILY. DO NOT CHEW, CRUSH, OR SPLIT TABLETS BEFORE SWALLOWING 56 tablet 0   No current facility-administered medications for this visit.    PHYSICAL EXAMINATION: ECOG PERFORMANCE STATUS: 1 - Symptomatic but completely ambulatory  Ht 5' 2.5" (1.588 m)   Wt 170 lb 6.4 oz (77.3 kg)   BMI 30.67 kg/m   Filed Weights   03/30/22 0930  Weight: 170 lb 6.4 oz (77.3 kg)     Physical Exam Constitutional:      Comments: She is alone.   HENT:     Head: Normocephalic and atraumatic.     Mouth/Throat:     Pharynx: No oropharyngeal exudate.  Eyes:     Pupils: Pupils are equal, round, and reactive to light.  Cardiovascular:     Rate and Rhythm: Normal rate and regular rhythm.  Pulmonary:     Effort: Pulmonary effort is normal. No respiratory distress.     Breath sounds: Normal breath sounds. No wheezing.  Abdominal:     General: Bowel sounds are normal. There is no distension.     Palpations: Abdomen is soft. There is no mass.     Tenderness: There is no abdominal tenderness. There is no guarding or rebound.  Musculoskeletal:        General: No tenderness. Normal range of motion.     Cervical back: Normal range of motion and neck supple.     Comments: Approximately 1 cm hard nodule felt in the anterior chest wall/left parasternal.  Skin:    General: Skin is warm.  Neurological:     Mental Status: She is alert and oriented to person, place, and time.  Psychiatric:        Mood and Affect: Affect normal.   Maculopapular rash mostly on the left lower extremity-mild on left lower; bilateral upper extremities  LABORATORY DATA:  I have reviewed the data as listed    Component Value Date/Time   NA 135 03/30/2022 0909   NA 135 11/28/2014 1401   K 3.5 03/30/2022 0909   K 4.7 11/28/2014 1401   CL 102 03/30/2022 0909   CL 99 (L) 11/28/2014 1401   CO2 25 03/30/2022 0909   CO2 28 11/28/2014 1401    GLUCOSE 172 (H) 03/30/2022 0909   GLUCOSE 215 (H) 11/28/2014 1401   BUN 15 03/30/2022 0909   BUN 16 11/28/2014 1401   CREATININE 1.10 (H) 03/30/2022 0909   CREATININE 0.96 11/28/2014 1401   CALCIUM 9.6 03/30/2022 0909   CALCIUM 9.9 11/28/2014 1401   PROT 7.2 03/30/2022 0909  PROT 7.6 11/28/2014 1401   ALBUMIN 3.6 03/30/2022 0909   ALBUMIN 4.5 11/28/2014 1401   AST 161 (H) 03/30/2022 0909   AST 26 11/28/2014 1401   ALT 80 (H) 03/30/2022 0909   ALT 28 11/28/2014 1401   ALKPHOS 95 03/30/2022 0909   ALKPHOS 78 11/28/2014 1401   BILITOT 0.7 03/30/2022 0909   BILITOT 0.5 11/28/2014 1401   GFRNONAA 55 (L) 03/30/2022 0909   GFRNONAA >60 11/28/2014 1401   GFRAA >60 05/30/2020 1022   GFRAA >60 11/28/2014 1401    No results found for: "SPEP", "UPEP"  Lab Results  Component Value Date   WBC 2.9 (L) 03/30/2022   NEUTROABS 1.4 (L) 03/30/2022   HGB 11.4 (L) 03/30/2022   HCT 32.9 (L) 03/30/2022   MCV 98.5 03/30/2022   PLT 199 03/30/2022      Chemistry      Component Value Date/Time   NA 135 03/30/2022 0909   NA 135 11/28/2014 1401   K 3.5 03/30/2022 0909   K 4.7 11/28/2014 1401   CL 102 03/30/2022 0909   CL 99 (L) 11/28/2014 1401   CO2 25 03/30/2022 0909   CO2 28 11/28/2014 1401   BUN 15 03/30/2022 0909   BUN 16 11/28/2014 1401   CREATININE 1.10 (H) 03/30/2022 0909   CREATININE 0.96 11/28/2014 1401      Component Value Date/Time   CALCIUM 9.6 03/30/2022 0909   CALCIUM 9.9 11/28/2014 1401   ALKPHOS 95 03/30/2022 0909   ALKPHOS 78 11/28/2014 1401   AST 161 (H) 03/30/2022 0909   AST 26 11/28/2014 1401   ALT 80 (H) 03/30/2022 0909   ALT 28 11/28/2014 1401   BILITOT 0.7 03/30/2022 0909   BILITOT 0.5 11/28/2014 1401       RADIOGRAPHIC STUDIES: I have personally reviewed the radiological images as listed and agreed with the findings in the report. No results found.   ASSESSMENT & PLAN:  Carcinoma of lower-outer quadrant of right breast in female, estrogen receptor  positive ([place])  # Recurrent breast cancer-ER positive PR negative ? HER-2/neu-currently on Faslodex plus abema. June 2nd, 2023- CT CAP-  No definitive evidence to suggest recurrent or metastatic disease in the chest, abdomen or pelvis.    # Continue Faslodex; Currently on  Abema 100 BID; Labs today-WBC-3.5; ANC-1.6; Hb-11- 12 platlets-N-  STABLE.Continue with abema at this time.   # Hyponatremia [133]/ elevated creatinine [1.2]-sec to poor hydration/diarrhea- ? abema- recommend increasing fluids/salt intake electrolytes. STABLE.  Continue albema.   # Hypomagnesia:   # DM- BG- 230- s/p endocrinology evaluation; with diet and medications- STABLE.   # recurrent UTI/ ? Suprapubic tenderness- S/p eval with urology- STABLE.  Monitor closely on Abema/with neutropenia- STABLE.   # Hypercalcemia: Ca 10.4-resolved- continue just vit D- STABLE.   # Skin rash- L LE more than R; bil UE minimal- recommend hydrocortisone cream as needed.  #DISPOSITION: **early AM appt # ADD iron studies/ferritin to today's labs # Faslodex today # Follow up in 4 weeksMD/ labs [CBC/CMP/Ca 27-29] Faslodex; --Dr.B         No orders of the defined types were placed in this encounter.     All questions were answered. The patient knows to call the clinic with any problems, questions or concerns.      [name], MD 03/30/2022 9:51 AM

## 2022-03-31 ENCOUNTER — Encounter: Payer: Self-pay | Admitting: Internal Medicine

## 2022-03-31 LAB — CANCER ANTIGEN 27.29: CA 27.29: 31.1 U/mL (ref 0.0–38.6)

## 2022-04-01 ENCOUNTER — Other Ambulatory Visit: Payer: Self-pay | Admitting: Internal Medicine

## 2022-04-01 LAB — URINE CULTURE: Culture: >=100000 — AB

## 2022-04-01 MED ORDER — CIPROFLOXACIN HCL 500 MG PO TABS: 500.0000 mg | ORAL_TABLET | Freq: Two times a day (BID) | ORAL | 0 refills | Status: DC

## 2022-04-06 ENCOUNTER — Inpatient Hospital Stay: Payer: PPO

## 2022-04-06 DIAGNOSIS — Z170 Estrogen receptor positive status [ER+]: Secondary | ICD-10-CM

## 2022-04-06 DIAGNOSIS — Z5111 Encounter for antineoplastic chemotherapy: Secondary | ICD-10-CM | POA: Diagnosis not present

## 2022-04-06 LAB — COMPREHENSIVE METABOLIC PANEL
ALT: 28 U/L (ref 0–44)
AST: 32 U/L (ref 15–41)
Albumin: 3.4 g/dL — ABNORMAL LOW (ref 3.5–5.0)
Alkaline Phosphatase: 84 U/L (ref 38–126)
Anion gap: 8 (ref 5–15)
BUN: 16 mg/dL (ref 8–23)
CO2: 24 mmol/L (ref 22–32)
Calcium: 10 mg/dL (ref 8.9–10.3)
Chloride: 104 mmol/L (ref 98–111)
Creatinine, Ser: 1.06 mg/dL — ABNORMAL HIGH (ref 0.44–1.00)
GFR, Estimated: 58 mL/min — ABNORMAL LOW (ref >60)
Glucose, Bld: 153 mg/dL — ABNORMAL HIGH (ref 70–99)
Potassium: 3.8 mmol/L (ref 3.5–5.1)
Sodium: 136 mmol/L (ref 135–145)
Total Bilirubin: 0.3 mg/dL (ref 0.3–1.2)
Total Protein: 6.8 g/dL (ref 6.5–8.1)

## 2022-04-22 ENCOUNTER — Encounter: Payer: Self-pay | Admitting: Internal Medicine

## 2022-04-29 ENCOUNTER — Other Ambulatory Visit (HOSPITAL_COMMUNITY): Payer: Self-pay

## 2022-05-04 ENCOUNTER — Inpatient Hospital Stay: Payer: PPO | Admitting: Internal Medicine

## 2022-05-04 ENCOUNTER — Inpatient Hospital Stay: Payer: PPO | Attending: Internal Medicine

## 2022-05-04 ENCOUNTER — Other Ambulatory Visit: Payer: Self-pay

## 2022-05-04 ENCOUNTER — Encounter: Payer: Self-pay | Admitting: Internal Medicine

## 2022-05-04 ENCOUNTER — Inpatient Hospital Stay: Payer: PPO

## 2022-05-04 VITALS — BP 122/75 | HR 84 | Temp 98.7°F | Ht 62.5 in | Wt 170.8 lb

## 2022-05-04 DIAGNOSIS — Z7901 Long term (current) use of anticoagulants: Secondary | ICD-10-CM | POA: Insufficient documentation

## 2022-05-04 DIAGNOSIS — Z170 Estrogen receptor positive status [ER+]: Secondary | ICD-10-CM

## 2022-05-04 DIAGNOSIS — Z794 Long term (current) use of insulin: Secondary | ICD-10-CM | POA: Diagnosis not present

## 2022-05-04 DIAGNOSIS — C50511 Malignant neoplasm of lower-outer quadrant of right female breast: Secondary | ICD-10-CM

## 2022-05-04 DIAGNOSIS — Z9013 Acquired absence of bilateral breasts and nipples: Secondary | ICD-10-CM | POA: Insufficient documentation

## 2022-05-04 DIAGNOSIS — Z79899 Other long term (current) drug therapy: Secondary | ICD-10-CM | POA: Diagnosis not present

## 2022-05-04 DIAGNOSIS — C7800 Secondary malignant neoplasm of unspecified lung: Secondary | ICD-10-CM | POA: Diagnosis present

## 2022-05-04 DIAGNOSIS — E871 Hypo-osmolality and hyponatremia: Secondary | ICD-10-CM | POA: Insufficient documentation

## 2022-05-04 DIAGNOSIS — E119 Type 2 diabetes mellitus without complications: Secondary | ICD-10-CM | POA: Diagnosis not present

## 2022-05-04 DIAGNOSIS — Z5111 Encounter for antineoplastic chemotherapy: Secondary | ICD-10-CM | POA: Insufficient documentation

## 2022-05-04 DIAGNOSIS — Z7984 Long term (current) use of oral hypoglycemic drugs: Secondary | ICD-10-CM | POA: Diagnosis not present

## 2022-05-04 LAB — COMPREHENSIVE METABOLIC PANEL
ALT: 25 U/L (ref 0–44)
AST: 37 U/L (ref 15–41)
Albumin: 3.5 g/dL (ref 3.5–5.0)
Alkaline Phosphatase: 71 U/L (ref 38–126)
Anion gap: 8 (ref 5–15)
BUN: 20 mg/dL (ref 8–23)
CO2: 23 mmol/L (ref 22–32)
Calcium: 9.2 mg/dL (ref 8.9–10.3)
Chloride: 103 mmol/L (ref 98–111)
Creatinine, Ser: 1.14 mg/dL — ABNORMAL HIGH (ref 0.44–1.00)
GFR, Estimated: 53 mL/min — ABNORMAL LOW (ref >60)
Glucose, Bld: 194 mg/dL — ABNORMAL HIGH (ref 70–99)
Potassium: 4 mmol/L (ref 3.5–5.1)
Sodium: 134 mmol/L — ABNORMAL LOW (ref 135–145)
Total Bilirubin: 0.4 mg/dL (ref 0.3–1.2)
Total Protein: 7 g/dL (ref 6.5–8.1)

## 2022-05-04 LAB — CBC WITH DIFFERENTIAL/PLATELET
Abs Immature Granulocytes: 0.01 10*3/uL (ref 0.00–0.07)
Basophils Absolute: 0.1 10*3/uL (ref 0.0–0.1)
Basophils Relative: 2 %
Eosinophils Absolute: 0 10*3/uL (ref 0.0–0.5)
Eosinophils Relative: 1 %
HCT: 32.8 % — ABNORMAL LOW (ref 36.0–46.0)
Hemoglobin: 11.3 g/dL — ABNORMAL LOW (ref 12.0–15.0)
Immature Granulocytes: 0 %
Lymphocytes Relative: 49 %
Lymphs Abs: 1.4 10*3/uL (ref 0.7–4.0)
MCH: 34.5 pg — ABNORMAL HIGH (ref 26.0–34.0)
MCHC: 34.5 g/dL (ref 30.0–36.0)
MCV: 100 fL (ref 80.0–100.0)
Monocytes Absolute: 0.2 10*3/uL (ref 0.1–1.0)
Monocytes Relative: 7 %
Neutro Abs: 1.2 10*3/uL — ABNORMAL LOW (ref 1.7–7.7)
Neutrophils Relative %: 41 %
Platelets: 184 10*3/uL (ref 150–400)
RBC: 3.28 MIL/uL — ABNORMAL LOW (ref 3.87–5.11)
RDW: 13.2 % (ref 11.5–15.5)
WBC: 2.8 10*3/uL — ABNORMAL LOW (ref 4.0–10.5)
nRBC: 0 % (ref 0.0–0.2)

## 2022-05-04 MED ORDER — FULVESTRANT 250 MG/5ML IM SOSY: 500.0000 mg | PREFILLED_SYRINGE | Freq: Once | INTRAMUSCULAR | Status: DC

## 2022-05-04 MED ORDER — FULVESTRANT 250 MG/5ML IM SOSY
500.0000 mg | PREFILLED_SYRINGE | Freq: Once | INTRAMUSCULAR | Status: AC
  Administered 2022-05-04: 500 mg via INTRAMUSCULAR
  Filled 2022-05-04: qty 10

## 2022-05-04 NOTE — Assessment & Plan Note (Addendum)
#  Recurrent breast cancer-ER positive PR negative ? HER-2/neu-currently on Faslodex plus abema. June 2nd, 2023- CT CAP-  No definitive evidence to suggest recurrent or metastatic disease in the chest, abdomen or pelvis.   # Continue Faslodex; Currently on  Abema 100 BID; Labs today-WBC-2.9 ; ANC-1.2 ; Hb-11-platlets-N- STABLE.  Repeat scans again in November-December  2023.  # Hyponatremia [133]/ elevated creatinine [1.2]-sec to poor hydration/diarrhea- ? abema- recommend increasing fluids/salt intake electrolytes. STABLE.    # Hypomagnesia: STABLE. Monitor for now.   # DM- BG- 172- s/p endocrinology evaluation; with diet and medications- STABLE.   # recurrent UTI/ ? Suprapubic tenderness- S/p eval with urology- STABLE.  Monitor closely on Abema/with neutropenia-await UA culture.  # Hypercalcemia: Ca 10.4-resolved- continue just vit D- STABLE.   #DISPOSITION: **early AM appt # Faslodex today # Follow up in 4 weeksMD/ labs [CBC/CMP/Ca 27-29] Faslodex; --Dr.B

## 2022-05-04 NOTE — Progress Notes (Signed)
No concerns. 

## 2022-05-04 NOTE — Progress Notes (Signed)
.[facility] OFFICE PROGRESS NOTE  Patient Care Team: Ezequiel Kayser, MD as PCP - General (Internal Medicine) Neldon Mc, MD (Inactive) (General Surgery) Everlene Farrier, MD (Obstetrics and Gynecology) Noreene Filbert, MD Forest Gleason, MD (Unknown Physician Specialty) [name], MD as Consulting Physician (Hematology and Oncology)   Cancer Staging  Carcinoma of lower-outer quadrant of right breast in female, estrogen receptor positive [facility]) Staging form: Breast, AJCC 7th Edition - Pathologic: ypT1c,ypN2a, MX - Signed by Haywood Lasso, MD on 03/10/2012 Cancer stage: ypT1c,ypN2a, MX    Oncology History Overview Note  # DEC 2012- RIGHT BREAST CA T2 N2 M0 tumor from biopsy.  Estrogen receptor positive, Progesterone receptor positive.  Current receptor negative by FISH 2. Neoadjuvant chemotherapy started in December of 28 with Cytoxan Adriamycin 3. Started on Taxol weekly chemotherapy. 4. Patient finished 12  cycles of Taxol chemotherapy in May of 2013.     5. Status post right modified radical mastectomy [Dr.Bowers; [place]] June of 2013, ypT1c  yp N2  MO. started also on Lerazole    7. Radiation therapy to the right breast (September of 2013).  Lymph node was positive for HER-2 receptor gene amplification of 2.22.  Will proceed with Herceptin treatment starting in September of 2013.   8.Patient has finished Herceptin (maintenance therapy) in August of 2014 8. Start patient on letrozole from November, 2013. 9. Patient started on Herceptin in September 2013.    10Patient finished 12 months of Herceptin therapy on August, 2014  # 6. Status post left side prophylactic mastectomy.  #Late MAY 2018-RECURRENCE BREAST CA- ER positive/PR negative; ?? HER-2/neu- [biopsy- proven-mediastinal lymph node; in suff for her 2 testing].  [elevated Tumor marker- CT/PET- uptake in Right Mediastinal LN; Sternum [June 2018 EBUS- Dr.Kasa]   # March 10 2017- faslodex +  Abema; OCT 5th CT-PR of mediastinal LN [consent]  #August 2020-left chest wall nodule biopsy benign  #August 2019- DVT-right upper extremity; Eliquis; stop end of March 2021 [repeat ultrasound negative/patient preference]; # JAN 2022-Cirrhosis- ? On CT scan-FEB 2022MRI liver- Negative fro overt cirrhosis --------------------------------------------------------------- -    DIAGNOSIS: [ BREAST CANCER- ER/PR/HER2 NEU POS  STAGE:  4   ;GOALS: PALLIATIVE  CURRENT/MOST RECENT THERAPY - ABEMA + FASLODEX    Carcinoma of lower-outer quadrant of right breast in female, estrogen receptor positive ([place])   INTERVAL HISTORY: Alone.  Ambulating independently.  Kimberly [name] 66 y.o.  female pleasant patient above history of metastatic ER PR positive HER-2/neu positive breast cancer currently on abema+ Faslodex is here for follow-up.  Denies any worsening pain.  No fevers or chills.  No shortness of breath or cough. No nausea or vomiting.   Review of Systems  Constitutional:  Positive for malaise/fatigue. Negative for chills, diaphoresis, fever and weight loss.  HENT:  Negative for nosebleeds and sore throat.   Eyes:  Negative for double vision.  Respiratory:  Negative for cough, hemoptysis, sputum production, shortness of breath and wheezing.   Cardiovascular:  Negative for chest pain, palpitations, orthopnea and leg swelling.  Gastrointestinal:  Negative for abdominal pain, blood in stool, constipation, heartburn, melena, nausea and vomiting.  Musculoskeletal:  Positive for back pain and joint pain.  Neurological:  Negative for dizziness, tingling, focal weakness, weakness and headaches.  Endo/Heme/Allergies:  Does not bruise/bleed easily.  Psychiatric/Behavioral:  Negative for depression. The patient is not nervous/anxious and does not have insomnia.       PAST MEDICAL HISTORY :  Past Medical History:  Diagnosis Date  Arthritis    Cancer of lower-outer quadrant of female breast ([place])  08/06/2011   RIGHT, chemo and bilateral mastectomies.   High cholesterol    History of kidney stones    Hx of bilateral breast implants    Hypertension    Lung metastases 20118   chemo pills and faslidex shots.   PONV (postoperative nausea and vomiting)    Type II diabetes mellitus ([place])    fasting 140-150   Vertigo    LAST WEEK   Vertigo 01/2017    PAST SURGICAL HISTORY :   Past Surgical History:  Procedure Laterality Date   BREAST BIOPSY  07/2011, 2020   right   BREAST RECONSTRUCTION  03/07/2012   Procedure: BREAST RECONSTRUCTION;  Surgeon: Crissie Reese, MD;  Location: [place];  Service: Plastics;  Laterality: Left;  BREAST RECONSTRUCTION WITH PLACEMENT OF TISSUE EXPANDER TO LEFT BREAST   BREAST RECONSTRUCTION  02/06/2013   CESAREAN SECTION  [id number]   ENDOBRONCHIAL ULTRASOUND N/A 02/24/2017   Procedure: ENDOBRONCHIAL ULTRASOUND;  Surgeon: [name], MD;  Location: [facility];  Service: Cardiopulmonary;  Laterality: N/A;   LATISSIMUS FLAP TO BREAST Right 02/06/2013   Procedure: LATISSIMUS FLAP TO RIGHT BREAST WITH IMPLANT;  Surgeon: Crissie Reese, MD;  Location: [facility];  Service: Plastics;  Laterality: Right;   MASTECTOMY  03/07/12   modified right; total left   MODIFIED MASTECTOMY  03/07/2012   Procedure: MODIFIED MASTECTOMY;  Surgeon: Haywood Lasso, MD;  Location: [facility];  Service: General;  Laterality: Right;   PORTACATH PLACEMENT  07/2011   SCAR REVISION  03/30/2012   Procedure: SCAR REVISION;  Surgeon: Haywood Lasso, MD;  Location: [place];  Service: General;  Laterality: Right;  CLOSURE OF MASTECTOMY INCISION   WISDOM TOOTH EXTRACTION      FAMILY HISTORY :   Family History  Problem Relation Age of Onset   Breast cancer Mother    Diabetes Father     SOCIAL HISTORY:   Social History   Tobacco Use   Smoking status: Never   Smokeless tobacco: Never  Vaping Use   Vaping Use: Never used  Substance Use Topics   Alcohol use: No   Drug use: No     ALLERGIES:  is allergic to sulfa antibiotics.  MEDICATIONS:  Current Outpatient Medications  Medication Sig Dispense Refill   abemaciclib (VERZENIO) 100 MG tablet TAKE 1 TABLET BY MOUTH TWICE DAILY. DO NOT CHEW, CRUSH, OR SPLIT TABLETS BEFORE SWALLOWING 56 tablet 6   atorvastatin (LIPITOR) 20 MG tablet Take 1 tablet (20 mg total) by mouth daily. 90 tablet 3   Cholecalciferol 50 MCG (2000 UT) CAPS Take 3 capsules daily for 3 months, then reduce to 1 capsule daily thereafter for Vitamin D Deficiency.     ciprofloxacin (CIPRO) 500 MG tablet Take 1 tablet (500 mg total) by mouth 2 (two) times daily. 6 tablet 0   clotrimazole-betamethasone (LOTRISONE) cream SMARTSIG:Topical Morning-Evening     fulvestrant (FASLODEX) 250 MG/5ML injection Inject into the muscle every 30 (thirty) days. One injection each buttock over 1-2 minutes. Warm prior to use.     glipiZIDE (GLUCOTROL XL) 10 MG 24 hr tablet Take 1 tablet (10 mg total) by mouth 2 (two) times daily. 180 tablet 3   insulin aspart (NOVOLOG FLEXPEN) 100 UNIT/ML FlexPen Inject 4 Units into the skin 3 (three) times daily with meals. 15 mL 0   Insulin Detemir (LEVEMIR) 100 UNIT/ML Pen Inject 20 Units into the skin at bedtime.  15 mL 0   levothyroxine (SYNTHROID) 25 MCG tablet Take 1 tablet (25 mcg total) by mouth daily before breakfast. 90 tablet 1   lisinopril (ZESTRIL) 10 MG tablet Take 10 mg by mouth daily.     loperamide (IMODIUM A-D) 2 MG tablet Take 2 mg by mouth 4 (four) times daily as needed for diarrhea or loose stools.     magnesium oxide (MAG-OX) 400 MG tablet Take 1 tablet by mouth daily.     montelukast (SINGULAIR) 10 MG tablet TAKE 1 TABLET BY MOUTH EVERYDAY AT BEDTIME 30 tablet 0   ondansetron (ZOFRAN-ODT) 4 MG disintegrating tablet Take 1 tablet (4 mg total) by mouth every 8 (eight) hours as needed for nausea or vomiting. 20 tablet 3   TRULICITY 0.37 CW/8.8QB SOPN SMARTSIG:0.5 Milliliter(s) SUB-Q Once a Week     No current  facility-administered medications for this visit.   Facility-Administered Medications Ordered in Other Visits  Medication Dose Route Frequency Provider Last Rate Last Admin   fulvestrant (FASLODEX) injection 500 mg  500 mg Intramuscular Once [name], MD        PHYSICAL EXAMINATION: ECOG PERFORMANCE STATUS: 1 - Symptomatic but completely ambulatory  BP 122/75 (BP Location: Left Arm, Patient Position: Sitting, Cuff Size: Normal)   Pulse 84   Temp 98.7 F (37.1 C) (Oral)   Ht 5' 2.5" (1.588 m)   Wt 170 lb 12.8 oz (77.5 kg)   SpO2 98%   BMI 30.74 kg/m   Filed Weights   05/04/22 0825  Weight: 170 lb 12.8 oz (77.5 kg)     Physical Exam Constitutional:      Comments: She is alone.   HENT:     Head: Normocephalic and atraumatic.     Mouth/Throat:     Pharynx: No oropharyngeal exudate.  Eyes:     Pupils: Pupils are equal, round, and reactive to light.  Cardiovascular:     Rate and Rhythm: Normal rate and regular rhythm.  Pulmonary:     Effort: Pulmonary effort is normal. No respiratory distress.     Breath sounds: Normal breath sounds. No wheezing.  Abdominal:     General: Bowel sounds are normal. There is no distension.     Palpations: Abdomen is soft. There is no mass.     Tenderness: There is no abdominal tenderness. There is no guarding or rebound.  Musculoskeletal:        General: No tenderness. Normal range of motion.     Cervical back: Normal range of motion and neck supple.     Comments: Approximately 1 cm hard nodule felt in the anterior chest wall/left parasternal.  Skin:    General: Skin is warm.  Neurological:     Mental Status: She is alert and oriented to person, place, and time.  Psychiatric:        Mood and Affect: Affect normal.    Maculopapular rash mostly on the left lower extremity-mild on left lower; bilateral upper extremities  LABORATORY DATA:  I have reviewed the data as listed    Component Value Date/Time   NA 134 (L)  05/04/2022 0827   NA 135 11/28/2014 1401   K 4.0 05/04/2022 0827   K 4.7 11/28/2014 1401   CL 103 05/04/2022 0827   CL 99 (L) 11/28/2014 1401   CO2 23 05/04/2022 0827   CO2 28 11/28/2014 1401   GLUCOSE 194 (H) 05/04/2022 0827   GLUCOSE 215 (H) 11/28/2014 1401   BUN 20 05/04/2022 0827  BUN 16 11/28/2014 1401   CREATININE 1.14 (H) 05/04/2022 0827   CREATININE 0.96 11/28/2014 1401   CALCIUM 9.2 05/04/2022 0827   CALCIUM 9.9 11/28/2014 1401   PROT 7.0 05/04/2022 0827   PROT 7.6 11/28/2014 1401   ALBUMIN 3.5 05/04/2022 0827   ALBUMIN 4.5 11/28/2014 1401   AST 37 05/04/2022 0827   AST 26 11/28/2014 1401   ALT 25 05/04/2022 0827   ALT 28 11/28/2014 1401   ALKPHOS 71 05/04/2022 0827   ALKPHOS 78 11/28/2014 1401   BILITOT 0.4 05/04/2022 0827   BILITOT 0.5 11/28/2014 1401   GFRNONAA 53 (L) 05/04/2022 0827   GFRNONAA >60 11/28/2014 1401   GFRAA >60 05/30/2020 1022   GFRAA >60 11/28/2014 1401    No results found for: "SPEP", "UPEP"  Lab Results  Component Value Date   WBC 2.8 (L) 05/04/2022   NEUTROABS 1.2 (L) 05/04/2022   HGB 11.3 (L) 05/04/2022   HCT 32.8 (L) 05/04/2022   MCV 100.0 05/04/2022   PLT 184 05/04/2022      Chemistry      Component Value Date/Time   NA 134 (L) 05/04/2022 0827   NA 135 11/28/2014 1401   K 4.0 05/04/2022 0827   K 4.7 11/28/2014 1401   CL 103 05/04/2022 0827   CL 99 (L) 11/28/2014 1401   CO2 23 05/04/2022 0827   CO2 28 11/28/2014 1401   BUN 20 05/04/2022 0827   BUN 16 11/28/2014 1401   CREATININE 1.14 (H) 05/04/2022 0827   CREATININE 0.96 11/28/2014 1401      Component Value Date/Time   CALCIUM 9.2 05/04/2022 0827   CALCIUM 9.9 11/28/2014 1401   ALKPHOS 71 05/04/2022 0827   ALKPHOS 78 11/28/2014 1401   AST 37 05/04/2022 0827   AST 26 11/28/2014 1401   ALT 25 05/04/2022 0827   ALT 28 11/28/2014 1401   BILITOT 0.4 05/04/2022 0827   BILITOT 0.5 11/28/2014 1401       RADIOGRAPHIC STUDIES: I have personally reviewed the  radiological images as listed and agreed with the findings in the report. No results found.   ASSESSMENT & PLAN:  Carcinoma of lower-outer quadrant of right breast in female, estrogen receptor positive ([place])  # Recurrent breast cancer-ER positive PR negative ? HER-2/neu-currently on Faslodex plus abema. June 2nd, 2023- CT CAP-  No definitive evidence to suggest recurrent or metastatic disease in the chest, abdomen or pelvis.    # Continue Faslodex; Currently on  Abema 100 BID; Labs today-WBC-2.9 ; ANC-1.2 ; Hb-11-platlets-N-   STABLE.  # Hyponatremia [133]/ elevated creatinine [1.2]-sec to poor hydration/diarrhea- ? abema- recommend increasing fluids/salt intake electrolytes. STABLE.    # Hypomagnesia: STABLE. Monitor for now.   # DM- BG- 172- s/p endocrinology evaluation; with diet and medications- STABLE.   # recurrent UTI/ ? Suprapubic tenderness- S/p eval with urology- STABLE.  Monitor closely on Abema/with neutropenia-await UA culture.  # Hypercalcemia: Ca 10.4-resolved- continue just vit D- STABLE.   #DISPOSITION: **early AM appt # Faslodex today # Follow up in 4 weeksMD/ labs [CBC/CMP/Ca 27-29] Faslodex; --Dr.B         Orders Placed This Encounter  Procedures   CBC with Differential/Platelet    Standing Status:   Future    Number of Occurrences:   1    Standing Expiration Date:   05/05/2023   Comprehensive metabolic panel    Standing Status:   Future    Number of Occurrences:   1    Standing  Expiration Date:   05/05/2023      All questions were answered. The patient knows to call the clinic with any problems, questions or concerns.      [name], MD 05/04/2022 8:55 AM

## 2022-05-05 LAB — CANCER ANTIGEN 27.29: CA 27.29: 29.5 U/mL (ref 0.0–38.6)

## 2022-05-21 ENCOUNTER — Other Ambulatory Visit (HOSPITAL_COMMUNITY): Payer: Self-pay

## 2022-06-01 ENCOUNTER — Inpatient Hospital Stay: Payer: PPO | Admitting: Internal Medicine

## 2022-06-01 ENCOUNTER — Inpatient Hospital Stay: Payer: PPO

## 2022-06-01 ENCOUNTER — Encounter: Payer: Self-pay | Admitting: Internal Medicine

## 2022-06-01 ENCOUNTER — Inpatient Hospital Stay: Payer: PPO | Attending: Internal Medicine

## 2022-06-01 DIAGNOSIS — E7800 Pure hypercholesterolemia, unspecified: Secondary | ICD-10-CM | POA: Insufficient documentation

## 2022-06-01 DIAGNOSIS — Z7984 Long term (current) use of oral hypoglycemic drugs: Secondary | ICD-10-CM | POA: Insufficient documentation

## 2022-06-01 DIAGNOSIS — E119 Type 2 diabetes mellitus without complications: Secondary | ICD-10-CM | POA: Diagnosis not present

## 2022-06-01 DIAGNOSIS — Z7901 Long term (current) use of anticoagulants: Secondary | ICD-10-CM | POA: Insufficient documentation

## 2022-06-01 DIAGNOSIS — C50511 Malignant neoplasm of lower-outer quadrant of right female breast: Secondary | ICD-10-CM

## 2022-06-01 DIAGNOSIS — E871 Hypo-osmolality and hyponatremia: Secondary | ICD-10-CM | POA: Diagnosis not present

## 2022-06-01 DIAGNOSIS — Z170 Estrogen receptor positive status [ER+]: Secondary | ICD-10-CM | POA: Diagnosis not present

## 2022-06-01 DIAGNOSIS — Z5111 Encounter for antineoplastic chemotherapy: Secondary | ICD-10-CM | POA: Diagnosis not present

## 2022-06-01 DIAGNOSIS — Z86718 Personal history of other venous thrombosis and embolism: Secondary | ICD-10-CM | POA: Insufficient documentation

## 2022-06-01 DIAGNOSIS — Z794 Long term (current) use of insulin: Secondary | ICD-10-CM | POA: Insufficient documentation

## 2022-06-01 DIAGNOSIS — Z803 Family history of malignant neoplasm of breast: Secondary | ICD-10-CM | POA: Diagnosis not present

## 2022-06-01 DIAGNOSIS — C7800 Secondary malignant neoplasm of unspecified lung: Secondary | ICD-10-CM | POA: Diagnosis present

## 2022-06-01 DIAGNOSIS — Z79899 Other long term (current) drug therapy: Secondary | ICD-10-CM | POA: Insufficient documentation

## 2022-06-01 DIAGNOSIS — Z9013 Acquired absence of bilateral breasts and nipples: Secondary | ICD-10-CM | POA: Diagnosis not present

## 2022-06-01 DIAGNOSIS — I10 Essential (primary) hypertension: Secondary | ICD-10-CM | POA: Insufficient documentation

## 2022-06-01 LAB — CBC WITH DIFFERENTIAL/PLATELET
Abs Immature Granulocytes: 0.01 10*3/uL (ref 0.00–0.07)
Basophils Absolute: 0.1 10*3/uL (ref 0.0–0.1)
Basophils Relative: 2 %
Eosinophils Absolute: 0.1 10*3/uL (ref 0.0–0.5)
Eosinophils Relative: 2 %
HCT: 35.1 % — ABNORMAL LOW (ref 36.0–46.0)
Hemoglobin: 12 g/dL (ref 12.0–15.0)
Immature Granulocytes: 0 %
Lymphocytes Relative: 36 %
Lymphs Abs: 1.1 10*3/uL (ref 0.7–4.0)
MCH: 33.9 pg (ref 26.0–34.0)
MCHC: 34.2 g/dL (ref 30.0–36.0)
MCV: 99.2 fL (ref 80.0–100.0)
Monocytes Absolute: 0.2 10*3/uL (ref 0.1–1.0)
Monocytes Relative: 7 %
Neutro Abs: 1.7 10*3/uL (ref 1.7–7.7)
Neutrophils Relative %: 53 %
Platelets: 221 10*3/uL (ref 150–400)
RBC: 3.54 MIL/uL — ABNORMAL LOW (ref 3.87–5.11)
RDW: 13.3 % (ref 11.5–15.5)
WBC: 3.1 10*3/uL — ABNORMAL LOW (ref 4.0–10.5)
nRBC: 0 % (ref 0.0–0.2)

## 2022-06-01 LAB — COMPREHENSIVE METABOLIC PANEL
ALT: 27 U/L (ref 0–44)
AST: 37 U/L (ref 15–41)
Albumin: 3.5 g/dL (ref 3.5–5.0)
Alkaline Phosphatase: 79 U/L (ref 38–126)
Anion gap: 8 (ref 5–15)
BUN: 16 mg/dL (ref 8–23)
CO2: 21 mmol/L — ABNORMAL LOW (ref 22–32)
Calcium: 8.9 mg/dL (ref 8.9–10.3)
Chloride: 103 mmol/L (ref 98–111)
Creatinine, Ser: 1.1 mg/dL — ABNORMAL HIGH (ref 0.44–1.00)
GFR, Estimated: 55 mL/min — ABNORMAL LOW (ref >60)
Glucose, Bld: 236 mg/dL — ABNORMAL HIGH (ref 70–99)
Potassium: 4.1 mmol/L (ref 3.5–5.1)
Sodium: 132 mmol/L — ABNORMAL LOW (ref 135–145)
Total Bilirubin: 0.5 mg/dL (ref 0.3–1.2)
Total Protein: 7 g/dL (ref 6.5–8.1)

## 2022-06-01 MED ORDER — FULVESTRANT 250 MG/5ML IM SOSY
500.0000 mg | PREFILLED_SYRINGE | Freq: Once | INTRAMUSCULAR | Status: AC
  Administered 2022-06-01: 500 mg via INTRAMUSCULAR
  Filled 2022-06-01: qty 10

## 2022-06-01 NOTE — Progress Notes (Signed)
.[place] OFFICE PROGRESS NOTE  Patient Care Team: Ezequiel Kayser, MD as PCP - General (Internal Medicine) Neldon Mc, MD (Inactive) (General Surgery) Everlene Farrier, MD (Obstetrics and Gynecology) Noreene Filbert, MD Forest Gleason, MD (Unknown Physician Specialty) [name], MD as Consulting Physician (Hematology and Oncology)   Cancer Staging  Carcinoma of lower-outer quadrant of right breast in female, estrogen receptor positive [facility]) Staging form: Breast, AJCC 7th Edition - Pathologic: ypT1c,ypN2a, MX - Signed by Haywood Lasso, MD on 03/10/2012 Cancer stage: ypT1c,ypN2a, MX    Oncology History Overview Note  # DEC 2012- RIGHT BREAST CA T2 N2 M0 tumor from biopsy.  Estrogen receptor positive, Progesterone receptor positive.  Current receptor negative by FISH 2. Neoadjuvant chemotherapy started in December of 28 with Cytoxan Adriamycin 3. Started on Taxol weekly chemotherapy. 4. Patient finished 12  cycles of Taxol chemotherapy in May of 2013.     5. Status post right modified radical mastectomy [Dr.Bowers; [place]] June of 2013, ypT1c  yp N2  MO. started also on Lerazole    7. Radiation therapy to the right breast (September of 2013).  Lymph node was positive for HER-2 receptor gene amplification of 2.22.  Will proceed with Herceptin treatment starting in September of 2013.   8.Patient has finished Herceptin (maintenance therapy) in August of 2014 8. Start patient on letrozole from November, 2013. 9. Patient started on Herceptin in September 2013.    10Patient finished 12 months of Herceptin therapy on August, 2014  # 6. Status post left side prophylactic mastectomy.  #Late MAY 2018-RECURRENCE BREAST CA- ER positive/PR negative; ?? HER-2/neu- [biopsy- proven-mediastinal lymph node; in suff for her 2 testing].  [elevated Tumor marker- CT/PET- uptake in Right Mediastinal LN; Sternum [June 2018 EBUS- Dr.Kasa]   # March 10 2017- faslodex +  Abema; OCT 5th CT-PR of mediastinal LN [consent]  #August 2020-left chest wall nodule biopsy benign  #August 2019- DVT-right upper extremity; Eliquis; stop end of March 2021 [repeat ultrasound negative/patient preference]; # JAN 2022-Cirrhosis- ? On CT scan-FEB 2022MRI liver- Negative fro overt cirrhosis --------------------------------------------------------------- -    DIAGNOSIS: [ BREAST CANCER- ER/PR/HER2 NEU POS  STAGE:  4   ;GOALS: PALLIATIVE  CURRENT/MOST RECENT THERAPY - ABEMA + FASLODEX    Carcinoma of lower-outer quadrant of right breast in female, estrogen receptor positive ([place])   INTERVAL HISTORY: Alone.  Ambulating independently.  Kimberly [name] 66 y.o.  female pleasant patient above history of metastatic ER PR positive HER-2/neu positive breast cancer currently on abema+ Faslodex is here for follow-up.  Denies any worsening pain.  No fevers or chills.  No shortness of breath or cough. No nausea or vomiting.   Review of Systems  Constitutional:  Positive for malaise/fatigue. Negative for chills, diaphoresis, fever and weight loss.  HENT:  Negative for nosebleeds and sore throat.   Eyes:  Negative for double vision.  Respiratory:  Negative for cough, hemoptysis, sputum production, shortness of breath and wheezing.   Cardiovascular:  Negative for chest pain, palpitations, orthopnea and leg swelling.  Gastrointestinal:  Negative for abdominal pain, blood in stool, constipation, heartburn, melena, nausea and vomiting.  Musculoskeletal:  Positive for back pain and joint pain.  Neurological:  Negative for dizziness, tingling, focal weakness, weakness and headaches.  Endo/Heme/Allergies:  Does not bruise/bleed easily.  Psychiatric/Behavioral:  Negative for depression. The patient is not nervous/anxious and does not have insomnia.       PAST MEDICAL HISTORY :  Past Medical History:  Diagnosis Date  Arthritis    Cancer of lower-outer quadrant of female breast ([place])  08/06/2011   RIGHT, chemo and bilateral mastectomies.   High cholesterol    History of kidney stones    Hx of bilateral breast implants    Hypertension    Lung metastases 20118   chemo pills and faslidex shots.   PONV (postoperative nausea and vomiting)    Type II diabetes mellitus ([place])    fasting 140-150   Vertigo    LAST WEEK   Vertigo 01/2017    PAST SURGICAL HISTORY :   Past Surgical History:  Procedure Laterality Date   BREAST BIOPSY  07/2011, 2020   right   BREAST RECONSTRUCTION  03/07/2012   Procedure: BREAST RECONSTRUCTION;  Surgeon: Crissie Reese, MD;  Location: [place];  Service: Plastics;  Laterality: Left;  BREAST RECONSTRUCTION WITH PLACEMENT OF TISSUE EXPANDER TO LEFT BREAST   BREAST RECONSTRUCTION  02/06/2013   CESAREAN SECTION  3212,2482   ENDOBRONCHIAL ULTRASOUND N/A 02/24/2017   Procedure: ENDOBRONCHIAL ULTRASOUND;  Surgeon: [name], MD;  Location: [facility];  Service: Cardiopulmonary;  Laterality: N/A;   LATISSIMUS FLAP TO BREAST Right 02/06/2013   Procedure: LATISSIMUS FLAP TO RIGHT BREAST WITH IMPLANT;  Surgeon: Crissie Reese, MD;  Location: [place];  Service: Plastics;  Laterality: Right;   MASTECTOMY  03/07/12   modified right; total left   MODIFIED MASTECTOMY  03/07/2012   Procedure: MODIFIED MASTECTOMY;  Surgeon: Haywood Lasso, MD;  Location: [place];  Service: General;  Laterality: Right;   PORTACATH PLACEMENT  07/2011   SCAR REVISION  03/30/2012   Procedure: SCAR REVISION;  Surgeon: Haywood Lasso, MD;  Location: [place];  Service: General;  Laterality: Right;  CLOSURE OF MASTECTOMY INCISION   WISDOM TOOTH EXTRACTION      FAMILY HISTORY :   Family History  Problem Relation Age of Onset   Breast cancer Mother    Diabetes Father     SOCIAL HISTORY:   Social History   Tobacco Use   Smoking status: Never   Smokeless tobacco: Never  Vaping Use   Vaping Use: Never used  Substance Use Topics   Alcohol use: No   Drug use: No     ALLERGIES:  is allergic to sulfa antibiotics.  MEDICATIONS:  Current Outpatient Medications  Medication Sig Dispense Refill   abemaciclib (VERZENIO) 100 MG tablet TAKE 1 TABLET BY MOUTH TWICE DAILY. DO NOT CHEW, CRUSH, OR SPLIT TABLETS BEFORE SWALLOWING 56 tablet 6   atorvastatin (LIPITOR) 20 MG tablet Take 1 tablet (20 mg total) by mouth daily. 90 tablet 3   Cholecalciferol 50 MCG (2000 UT) CAPS Take 3 capsules daily for 3 months, then reduce to 1 capsule daily thereafter for Vitamin D Deficiency.     clotrimazole-betamethasone (LOTRISONE) cream SMARTSIG:Topical Morning-Evening     fulvestrant (FASLODEX) 250 MG/5ML injection Inject into the muscle every 30 (thirty) days. One injection each buttock over 1-2 minutes. Warm prior to use.     glipiZIDE (GLUCOTROL XL) 10 MG 24 hr tablet Take 1 tablet (10 mg total) by mouth 2 (two) times daily. 180 tablet 3   insulin aspart (NOVOLOG FLEXPEN) 100 UNIT/ML FlexPen Inject 4 Units into the skin 3 (three) times daily with meals. 15 mL 0   Insulin Detemir (LEVEMIR) 100 UNIT/ML Pen Inject 20 Units into the skin at bedtime. 15 mL 0   levothyroxine (SYNTHROID) 25 MCG tablet Take 1 tablet (25 mcg total) by mouth daily before breakfast. 90  tablet 1   lisinopril (ZESTRIL) 10 MG tablet Take 10 mg by mouth daily.     loperamide (IMODIUM A-D) 2 MG tablet Take 2 mg by mouth 4 (four) times daily as needed for diarrhea or loose stools.     magnesium oxide (MAG-OX) 400 MG tablet Take 1 tablet by mouth daily.     ondansetron (ZOFRAN-ODT) 4 MG disintegrating tablet Take 1 tablet (4 mg total) by mouth every 8 (eight) hours as needed for nausea or vomiting. 20 tablet 3   TRULICITY 5.49 IY/6.4BR SOPN SMARTSIG:0.5 Milliliter(s) SUB-Q Once a Week     No current facility-administered medications for this visit.    PHYSICAL EXAMINATION: ECOG PERFORMANCE STATUS: 1 - Symptomatic but completely ambulatory  BP 114/75   Pulse 96   Temp 97.9 F (36.6 C) (Tympanic)   Resp  18   There were no vitals filed for this visit.    Physical Exam Constitutional:      Comments: She is alone.   HENT:     Head: Normocephalic and atraumatic.     Mouth/Throat:     Pharynx: No oropharyngeal exudate.  Eyes:     Pupils: Pupils are equal, round, and reactive to light.  Cardiovascular:     Rate and Rhythm: Normal rate and regular rhythm.  Pulmonary:     Effort: Pulmonary effort is normal. No respiratory distress.     Breath sounds: Normal breath sounds. No wheezing.  Abdominal:     General: Bowel sounds are normal. There is no distension.     Palpations: Abdomen is soft. There is no mass.     Tenderness: There is no abdominal tenderness. There is no guarding or rebound.  Musculoskeletal:        General: No tenderness. Normal range of motion.     Cervical back: Normal range of motion and neck supple.     Comments: Approximately 1 cm hard nodule felt in the anterior chest wall/left parasternal.  Skin:    General: Skin is warm.  Neurological:     Mental Status: She is alert and oriented to person, place, and time.  Psychiatric:        Mood and Affect: Affect normal.    Maculopapular rash mostly on the left lower extremity-mild on left lower; bilateral upper extremities  LABORATORY DATA:  I have reviewed the data as listed    Component Value Date/Time   NA 132 (L) 06/01/2022 0950   NA 135 11/28/2014 1401   K 4.1 06/01/2022 0950   K 4.7 11/28/2014 1401   CL 103 06/01/2022 0950   CL 99 (L) 11/28/2014 1401   CO2 21 (L) 06/01/2022 0950   CO2 28 11/28/2014 1401   GLUCOSE 236 (H) 06/01/2022 0950   GLUCOSE 215 (H) 11/28/2014 1401   BUN 16 06/01/2022 0950   BUN 16 11/28/2014 1401   CREATININE 1.10 (H) 06/01/2022 0950   CREATININE 0.96 11/28/2014 1401   CALCIUM 8.9 06/01/2022 0950   CALCIUM 9.9 11/28/2014 1401   PROT 7.0 06/01/2022 0950   PROT 7.6 11/28/2014 1401   ALBUMIN 3.5 06/01/2022 0950   ALBUMIN 4.5 11/28/2014 1401   AST 37 06/01/2022 0950   AST 26  11/28/2014 1401   ALT 27 06/01/2022 0950   ALT 28 11/28/2014 1401   ALKPHOS 79 06/01/2022 0950   ALKPHOS 78 11/28/2014 1401   BILITOT 0.5 06/01/2022 0950   BILITOT 0.5 11/28/2014 1401   GFRNONAA 55 (L) 06/01/2022 0950   GFRNONAA >60 11/28/2014 1401  GFRAA >60 05/30/2020 1022   GFRAA >60 11/28/2014 1401    No results found for: "SPEP", "UPEP"  Lab Results  Component Value Date   WBC 3.1 (L) 06/01/2022   NEUTROABS 1.7 06/01/2022   HGB 12.0 06/01/2022   HCT 35.1 (L) 06/01/2022   MCV 99.2 06/01/2022   PLT 221 06/01/2022      Chemistry      Component Value Date/Time   NA 132 (L) 06/01/2022 0950   NA 135 11/28/2014 1401   K 4.1 06/01/2022 0950   K 4.7 11/28/2014 1401   CL 103 06/01/2022 0950   CL 99 (L) 11/28/2014 1401   CO2 21 (L) 06/01/2022 0950   CO2 28 11/28/2014 1401   BUN 16 06/01/2022 0950   BUN 16 11/28/2014 1401   CREATININE 1.10 (H) 06/01/2022 0950   CREATININE 0.96 11/28/2014 1401      Component Value Date/Time   CALCIUM 8.9 06/01/2022 0950   CALCIUM 9.9 11/28/2014 1401   ALKPHOS 79 06/01/2022 0950   ALKPHOS 78 11/28/2014 1401   AST 37 06/01/2022 0950   AST 26 11/28/2014 1401   ALT 27 06/01/2022 0950   ALT 28 11/28/2014 1401   BILITOT 0.5 06/01/2022 0950   BILITOT 0.5 11/28/2014 1401       RADIOGRAPHIC STUDIES: I have personally reviewed the radiological images as listed and agreed with the findings in the report. No results found.   ASSESSMENT & PLAN:  Carcinoma of lower-outer quadrant of right breast in female, estrogen receptor positive ([place])  # Recurrent breast cancer-ER positive PR negative ? HER-2/neu-currently on Faslodex plus abema. June 2nd, 2023- CT CAP-  No definitive evidence to suggest recurrent or metastatic disease in the chest, abdomen or pelvis.    # Continue Faslodex; Currently on  Abema 100 BID; Labs today-WBC-2.9 ; ANC-1.7; Hb-11-platlets-N-  STABLE. Repeat scans again  In December  2023. Will order at next visit.  Patient  having issues with assistance with Faslodex-starting 4024.  Discussed with authorization team-await co-pay assistance program.   # Hyponatremia [133]/ elevated creatinine [1.2]-sec to poor hydration/diarrhea- ? abema- recommend increasing fluids/salt intake electrolytes. STABLE.    # Hypomagnesia:. Monitor for now.  STABLE  # DM- BG- 236- s/p endocrinology evaluation; with diet and medications- STABLE.   # recurrent UTI/ ? Suprapubic tenderness- S/p eval with urology- STABLE.  Monitor closely on Abema/with neutropenia- STABLE  # Hypercalcemia: Ca 10.4-resolved- continue just vit D- STABLE.   #DISPOSITION: **early AM appt # Faslodex today # Follow up in 4 weeksMD/ labs [CBC/CMP/Ca 27-29] Faslodex; --Dr.B         Orders Placed This Encounter  Procedures   CBC with Differential/Platelet    Standing Status:   Future    Standing Expiration Date:   06/02/2023   Comprehensive metabolic panel    Standing Status:   Future    Standing Expiration Date:   06/02/2023   Cancer antigen 27.29    Standing Status:   Future    Standing Expiration Date:   06/02/2023      All questions were answered. The patient knows to call the clinic with any problems, questions or concerns.      [name], MD 06/01/2022 11:47 AM

## 2022-06-01 NOTE — Assessment & Plan Note (Addendum)
#  Recurrent breast cancer-ER positive PR negative ? HER-2/neu-currently on Faslodex plus abema. June 2nd, 2023- CT CAP-  No definitive evidence to suggest recurrent or metastatic disease in the chest, abdomen or pelvis.   # Continue Faslodex; Currently on  Abema 100 BID; Labs today-WBC-2.9 ; ANC-1.7; Hb-11-platlets-N-  STABLE. Repeat scans again  In December  2023. Will order at next visit.  Patient having issues with assistance with Faslodex-starting 4024.  Discussed with authorization team-await co-pay assistance program.   # Hyponatremia [133]/ elevated creatinine [1.2]-sec to poor hydration/diarrhea- ? abema- recommend increasing fluids/salt intake electrolytes. STABLE.    # Hypomagnesia:. Monitor for now.  STABLE  # DM- BG- 236- s/p endocrinology evaluation; with diet and medications- STABLE.   # recurrent UTI/ ? Suprapubic tenderness- S/p eval with urology- STABLE.  Monitor closely on Abema/with neutropenia- STABLE  # Hypercalcemia: Ca 10.4-resolved- continue just vit D- STABLE.   #DISPOSITION: **early AM appt # Faslodex today # Follow up in 4 weeksMD/ labs [CBC/CMP/Ca 27-29] Faslodex; --Dr.B

## 2022-06-01 NOTE — Progress Notes (Signed)
Patient here today for follow up regarding breast cancer. Patient denies any concerns today.

## 2022-06-02 LAB — CANCER ANTIGEN 27.29: CA 27.29: 41.7 U/mL — ABNORMAL HIGH (ref 0.0–38.6)

## 2022-06-08 ENCOUNTER — Ambulatory Visit: Payer: PPO | Admitting: Internal Medicine

## 2022-06-08 ENCOUNTER — Other Ambulatory Visit: Payer: PPO

## 2022-06-08 ENCOUNTER — Ambulatory Visit: Payer: PPO

## 2022-06-17 ENCOUNTER — Encounter: Payer: Self-pay | Admitting: Internal Medicine

## 2022-06-28 ENCOUNTER — Encounter: Payer: Self-pay | Admitting: Internal Medicine

## 2022-06-29 ENCOUNTER — Inpatient Hospital Stay: Payer: PPO

## 2022-06-29 ENCOUNTER — Inpatient Hospital Stay: Payer: PPO | Admitting: Internal Medicine

## 2022-06-29 ENCOUNTER — Encounter: Payer: Self-pay | Admitting: Internal Medicine

## 2022-06-29 ENCOUNTER — Telehealth: Payer: Self-pay | Admitting: Internal Medicine

## 2022-06-29 ENCOUNTER — Other Ambulatory Visit (HOSPITAL_COMMUNITY): Payer: Self-pay

## 2022-06-29 ENCOUNTER — Telehealth: Payer: Self-pay

## 2022-06-29 VITALS — BP 122/68 | HR 82 | Temp 96.6°F | Resp 20 | Wt 168.6 lb

## 2022-06-29 DIAGNOSIS — Z5111 Encounter for antineoplastic chemotherapy: Secondary | ICD-10-CM | POA: Diagnosis not present

## 2022-06-29 DIAGNOSIS — C50511 Malignant neoplasm of lower-outer quadrant of right female breast: Secondary | ICD-10-CM | POA: Diagnosis not present

## 2022-06-29 DIAGNOSIS — Z170 Estrogen receptor positive status [ER+]: Secondary | ICD-10-CM | POA: Diagnosis not present

## 2022-06-29 LAB — COMPREHENSIVE METABOLIC PANEL
ALT: 28 U/L (ref 0–44)
AST: 38 U/L (ref 15–41)
Albumin: 3.3 g/dL — ABNORMAL LOW (ref 3.5–5.0)
Alkaline Phosphatase: 74 U/L (ref 38–126)
Anion gap: 6 (ref 5–15)
BUN: 18 mg/dL (ref 8–23)
CO2: 21 mmol/L — ABNORMAL LOW (ref 22–32)
Calcium: 9.2 mg/dL (ref 8.9–10.3)
Chloride: 107 mmol/L (ref 98–111)
Creatinine, Ser: 1.15 mg/dL — ABNORMAL HIGH (ref 0.44–1.00)
GFR, Estimated: 52 mL/min — ABNORMAL LOW (ref >60)
Glucose, Bld: 240 mg/dL — ABNORMAL HIGH (ref 70–99)
Potassium: 3.9 mmol/L (ref 3.5–5.1)
Sodium: 134 mmol/L — ABNORMAL LOW (ref 135–145)
Total Bilirubin: 0.4 mg/dL (ref 0.3–1.2)
Total Protein: 6.8 g/dL (ref 6.5–8.1)

## 2022-06-29 LAB — CBC WITH DIFFERENTIAL/PLATELET
Abs Immature Granulocytes: 0.01 10*3/uL (ref 0.00–0.07)
Basophils Absolute: 0 10*3/uL (ref 0.0–0.1)
Basophils Relative: 2 %
Eosinophils Absolute: 0.1 10*3/uL (ref 0.0–0.5)
Eosinophils Relative: 2 %
HCT: 32 % — ABNORMAL LOW (ref 36.0–46.0)
Hemoglobin: 10.8 g/dL — ABNORMAL LOW (ref 12.0–15.0)
Immature Granulocytes: 0 %
Lymphocytes Relative: 51 %
Lymphs Abs: 1.3 10*3/uL (ref 0.7–4.0)
MCH: 33.5 pg (ref 26.0–34.0)
MCHC: 33.8 g/dL (ref 30.0–36.0)
MCV: 99.4 fL (ref 80.0–100.0)
Monocytes Absolute: 0.1 10*3/uL (ref 0.1–1.0)
Monocytes Relative: 5 %
Neutro Abs: 1 10*3/uL — ABNORMAL LOW (ref 1.7–7.7)
Neutrophils Relative %: 40 %
Platelets: 163 10*3/uL (ref 150–400)
RBC: 3.22 MIL/uL — ABNORMAL LOW (ref 3.87–5.11)
RDW: 13.2 % (ref 11.5–15.5)
WBC: 2.5 10*3/uL — ABNORMAL LOW (ref 4.0–10.5)
nRBC: 0 % (ref 0.0–0.2)

## 2022-06-29 MED ORDER — FULVESTRANT 250 MG/5ML IM SOSY
500.0000 mg | PREFILLED_SYRINGE | Freq: Once | INTRAMUSCULAR | Status: AC
  Administered 2022-06-29: 500 mg via INTRAMUSCULAR

## 2022-06-29 NOTE — Telephone Encounter (Signed)
Sent message for scheduling of CT per chat- a message was left for patient to call back

## 2022-06-29 NOTE — Progress Notes (Signed)
I connected with Kimberly [name] on 06/29/22 at 10:15 AM EDT by video enabled telemedicine visit and verified that I am speaking with the correct person using two identifiers.  I discussed the limitations, risks, security and privacy concerns of performing an evaluation and management service by telemedicine and the availability of in-person appointments. I also discussed with the patient that there may be a patient responsible charge related to this service. The patient expressed understanding and agreed to proceed.    Other persons participating in the visit and their role in the encounter: RN/medical reconciliation Patient's location: office  Provider's location: home  Oncology History Overview Note  # DEC 2012- RIGHT BREAST CA T2 N2 M0 tumor from biopsy.  Estrogen receptor positive, Progesterone receptor positive.  Current receptor negative by FISH 2. Neoadjuvant chemotherapy started in December of 28 with Cytoxan Adriamycin 3. Started on Taxol weekly chemotherapy. 4. Patient finished 12  cycles of Taxol chemotherapy in May of 2013.     5. Status post right modified radical mastectomy [Dr.Bowers; [place]] June of 2013, ypT1c  yp N2  MO. started also on Lerazole    7. Radiation therapy to the right breast (September of 2013).  Lymph node was positive for HER-2 receptor gene amplification of 2.22.  Will proceed with Herceptin treatment starting in September of 2013.   8.Patient has finished Herceptin (maintenance therapy) in August of 2014 8. Start patient on letrozole from November, 2013. 9. Patient started on Herceptin in September 2013.    10Patient finished 12 months of Herceptin therapy on August, 2014  # 6. Status post left side prophylactic mastectomy.  #Late MAY 2018-RECURRENCE BREAST CA- ER positive/PR negative; ?? HER-2/neu- [biopsy- proven-mediastinal lymph node; in suff for her 2 testing].  [elevated Tumor marker- CT/PET- uptake in Right Mediastinal LN; Sternum [June 2018 EBUS-  Dr.Kasa]   # March 10 2017- faslodex + Abema; OCT 5th CT-PR of mediastinal LN [consent]  #August 2020-left chest wall nodule biopsy benign  #August 2019- DVT-right upper extremity; Eliquis; stop end of March 2021 [repeat ultrasound negative/patient preference]; # JAN 2022-Cirrhosis- ? On CT scan-FEB 2022MRI liver- Negative fro overt cirrhosis --------------------------------------------------------------- -    DIAGNOSIS: [ BREAST CANCER- ER/PR/HER2 NEU POS  STAGE:  4   ;GOALS: PALLIATIVE  CURRENT/MOST RECENT THERAPY - ABEMA + FASLODEX    Carcinoma of lower-outer quadrant of right breast in female, estrogen receptor positive ([place])    Chief Complaint: Breast cancer   History of present illness:Kimberly [name] 66 y.o.  female with history of history of metastatic ER PR positive HER-2/neu positive breast cancer currently on abema+ Faslodex is here for follow-up.   Denies any worsening pain.  No fevers or chills.  No shortness of breath or cough. No nausea or vomiting.   Patient states she has no concerns at the moment.  Observation/objective: Alert & oriented x 3. In No acute distress.   Assessment and plan: Carcinoma of lower-outer quadrant of right breast in female, estrogen receptor positive ([place])  # Recurrent breast cancer-ER positive PR negative ? HER-2/neu-currently on Faslodex plus abema. June 2nd, 2023- CT CAP-  No definitive evidence to suggest recurrent or metastatic disease in the chest, abdomen or pelvis.    # Continue Faslodex; Currently on  Abema 100 BID; Labs today-WBC-2.5 ; ANC-1.0; Hb-110.8 platlets-N-  STABLE.  Repeat scans again  In December  2023. Will order today.   Patient having issues with assistance with Faslodex-starting 2024. If issues consider AI. Pt discussed with Pharmacy social  workers.   # Hyponatremia [133]/ elevated creatinine [1.2]-sec to poor hydration/diarrhea- ? abema- recommend increasing fluids/salt intake electrolytes. STABLE.    #  Hypomagnesia:. Monitor for now. STABLE.   # DM- BG- 240- s/p endocrinology evaluation; with diet and medications- STABLE.   # recurrent UTI/ ? Suprapubic tenderness- S/p eval with urology- STABLE.  Monitor closely on Abema/with neutropenia- STABLE.   # Hypercalcemia: Ca 10.4-resolved- continue just vit D- STABLE.   #DISPOSITION: **early AM appt # Faslodex today # Follow up in 4 weeksMD/ labs [CBC/CMP/Ca 27-29] Faslodex; CT CAP; --Dr.B      Follow-up instructions:  I discussed the assessment and treatment plan with the patient.  The patient was provided an opportunity to ask questions and all were answered.  The patient agreed with the plan and demonstrated understanding of instructions.  The patient was advised to call back or seek an in person evaluation if the symptoms worsen or if the condition fails to improve as anticipated.  Dr. Charlaine Dalton [place] at [facility] 06/29/2022 10:53 AM

## 2022-06-29 NOTE — Progress Notes (Signed)
Patient states she has no concerns at the moment.

## 2022-06-29 NOTE — Telephone Encounter (Signed)
Oral Oncology Patient Advocate Encounter   Received notification that patient is due for re-enrollment for assistance for Verzenio through [employer].   Re-enrollment process has been initiated and will be submitted upon completion of necessary documents.  [employer]' phone number [phone].   I will continue to follow until final determination.  [name], [facility] Patient [place]  [phone] (phone) [phone] (fax) 06/29/2022 10:15 AM

## 2022-06-29 NOTE — Assessment & Plan Note (Signed)
#  Recurrent breast cancer-ER positive PR negative ? HER-2/neu-currently on Faslodex plus abema. June 2nd, 2023- CT CAP-  No definitive evidence to suggest recurrent or metastatic disease in the chest, abdomen or pelvis.   # Continue Faslodex; Currently on  Abema 100 BID; Labs today-WBC-2.5 ; ANC-1.0; Hb-110.8 platlets-N-  STABLE.  Repeat scans again  In December  2023. Will order today.   Patient having issues with assistance with Faslodex-starting 2024. If issues consider AI. Pt discussed with Pharmacy social workers.   # Hyponatremia [133]/ elevated creatinine [1.2]-sec to poor hydration/diarrhea- ? abema- recommend increasing fluids/salt intake electrolytes. STABLE.    # Hypomagnesia:. Monitor for now. STABLE.   # DM- BG- 240- s/p endocrinology evaluation; with diet and medications- STABLE.   # recurrent UTI/ ? Suprapubic tenderness- S/p eval with urology- STABLE.  Monitor closely on Abema/with neutropenia- STABLE.   # Hypercalcemia: Ca 10.4-resolved- continue just vit D- STABLE.   #DISPOSITION: **early AM appt # Faslodex today # Follow up in 4 weeksMD/ labs [CBC/CMP/Ca 27-29] Faslodex; CT CAP; --Dr.B

## 2022-06-30 LAB — CANCER ANTIGEN 27.29: CA 27.29: 28.2 U/mL (ref 0.0–38.6)

## 2022-07-01 NOTE — Telephone Encounter (Signed)
Oral Oncology Patient Advocate Encounter   Submitted application for assistance for Verzenio to [employer].   Application submitted via e-fax to [phone].   [employer]' phone number [phone].   I will continue to check the status until final determination.   [name], [facility] Patient [place]  [phone] (phone) [phone] (fax) 07/01/2022 8:36 AM

## 2022-07-19 ENCOUNTER — Ambulatory Visit
Admission: RE | Admit: 2022-07-19 | Discharge: 2022-07-19 | Disposition: A | Discharge status: Home / Self Care | Payer: PPO | Source: Ambulatory Visit | Attending: Internal Medicine | Admitting: Internal Medicine

## 2022-07-19 DIAGNOSIS — Z170 Estrogen receptor positive status [ER+]: Secondary | ICD-10-CM | POA: Insufficient documentation

## 2022-07-19 DIAGNOSIS — C50511 Malignant neoplasm of lower-outer quadrant of right female breast: Secondary | ICD-10-CM | POA: Diagnosis present

## 2022-07-19 MED ORDER — IOHEXOL 300 MG/ML  SOLN
100.0000 mL | Freq: Once | INTRAMUSCULAR | Status: AC | PRN
  Administered 2022-07-19: 100 mL via INTRAVENOUS

## 2022-07-23 ENCOUNTER — Inpatient Hospital Stay: Admission: RE | Admit: 2022-07-23 | Payer: PPO | Source: Ambulatory Visit

## 2022-07-27 ENCOUNTER — Encounter: Payer: Self-pay | Admitting: Internal Medicine

## 2022-07-27 ENCOUNTER — Inpatient Hospital Stay: Payer: PPO | Admitting: Internal Medicine

## 2022-07-27 ENCOUNTER — Inpatient Hospital Stay: Payer: PPO | Attending: Internal Medicine

## 2022-07-27 ENCOUNTER — Inpatient Hospital Stay: Payer: PPO

## 2022-07-27 VITALS — BP 110/77 | HR 106 | Temp 98.3°F | Resp 18 | Wt 164.8 lb

## 2022-07-27 DIAGNOSIS — Z170 Estrogen receptor positive status [ER+]: Secondary | ICD-10-CM

## 2022-07-27 DIAGNOSIS — C50511 Malignant neoplasm of lower-outer quadrant of right female breast: Secondary | ICD-10-CM | POA: Diagnosis not present

## 2022-07-27 DIAGNOSIS — Z7901 Long term (current) use of anticoagulants: Secondary | ICD-10-CM | POA: Insufficient documentation

## 2022-07-27 DIAGNOSIS — I10 Essential (primary) hypertension: Secondary | ICD-10-CM | POA: Insufficient documentation

## 2022-07-27 DIAGNOSIS — C7800 Secondary malignant neoplasm of unspecified lung: Secondary | ICD-10-CM | POA: Diagnosis present

## 2022-07-27 DIAGNOSIS — E871 Hypo-osmolality and hyponatremia: Secondary | ICD-10-CM | POA: Insufficient documentation

## 2022-07-27 DIAGNOSIS — Z794 Long term (current) use of insulin: Secondary | ICD-10-CM | POA: Insufficient documentation

## 2022-07-27 DIAGNOSIS — Z7984 Long term (current) use of oral hypoglycemic drugs: Secondary | ICD-10-CM | POA: Insufficient documentation

## 2022-07-27 DIAGNOSIS — Z5111 Encounter for antineoplastic chemotherapy: Secondary | ICD-10-CM | POA: Diagnosis not present

## 2022-07-27 DIAGNOSIS — Z9013 Acquired absence of bilateral breasts and nipples: Secondary | ICD-10-CM | POA: Diagnosis not present

## 2022-07-27 DIAGNOSIS — Z86718 Personal history of other venous thrombosis and embolism: Secondary | ICD-10-CM | POA: Diagnosis not present

## 2022-07-27 DIAGNOSIS — Z79899 Other long term (current) drug therapy: Secondary | ICD-10-CM | POA: Insufficient documentation

## 2022-07-27 DIAGNOSIS — J209 Acute bronchitis, unspecified: Secondary | ICD-10-CM | POA: Diagnosis not present

## 2022-07-27 DIAGNOSIS — E119 Type 2 diabetes mellitus without complications: Secondary | ICD-10-CM | POA: Insufficient documentation

## 2022-07-27 LAB — CBC WITH DIFFERENTIAL/PLATELET
Abs Immature Granulocytes: 0.01 10*3/uL (ref 0.00–0.07)
Basophils Absolute: 0.1 10*3/uL (ref 0.0–0.1)
Basophils Relative: 2 %
Eosinophils Absolute: 0.1 10*3/uL (ref 0.0–0.5)
Eosinophils Relative: 2 %
HCT: 32.5 % — ABNORMAL LOW (ref 36.0–46.0)
Hemoglobin: 11.3 g/dL — ABNORMAL LOW (ref 12.0–15.0)
Immature Granulocytes: 0 %
Lymphocytes Relative: 51 %
Lymphs Abs: 1.4 10*3/uL (ref 0.7–4.0)
MCH: 34.9 pg — ABNORMAL HIGH (ref 26.0–34.0)
MCHC: 34.8 g/dL (ref 30.0–36.0)
MCV: 100.3 fL — ABNORMAL HIGH (ref 80.0–100.0)
Monocytes Absolute: 0.2 10*3/uL (ref 0.1–1.0)
Monocytes Relative: 9 %
Neutro Abs: 1 10*3/uL — ABNORMAL LOW (ref 1.7–7.7)
Neutrophils Relative %: 36 %
Platelets: 232 10*3/uL (ref 150–400)
RBC: 3.24 MIL/uL — ABNORMAL LOW (ref 3.87–5.11)
RDW: 13.8 % (ref 11.5–15.5)
WBC: 2.8 10*3/uL — ABNORMAL LOW (ref 4.0–10.5)
nRBC: 0 % (ref 0.0–0.2)

## 2022-07-27 LAB — COMPREHENSIVE METABOLIC PANEL
ALT: 29 U/L (ref 0–44)
AST: 46 U/L — ABNORMAL HIGH (ref 15–41)
Albumin: 3.5 g/dL (ref 3.5–5.0)
Alkaline Phosphatase: 76 U/L (ref 38–126)
Anion gap: 6 (ref 5–15)
BUN: 16 mg/dL (ref 8–23)
CO2: 22 mmol/L (ref 22–32)
Calcium: 9.1 mg/dL (ref 8.9–10.3)
Chloride: 103 mmol/L (ref 98–111)
Creatinine, Ser: 1.09 mg/dL — ABNORMAL HIGH (ref 0.44–1.00)
GFR, Estimated: 56 mL/min — ABNORMAL LOW (ref >60)
Glucose, Bld: 233 mg/dL — ABNORMAL HIGH (ref 70–99)
Potassium: 3.4 mmol/L — ABNORMAL LOW (ref 3.5–5.1)
Sodium: 131 mmol/L — ABNORMAL LOW (ref 135–145)
Total Bilirubin: 0.6 mg/dL (ref 0.3–1.2)
Total Protein: 7.3 g/dL (ref 6.5–8.1)

## 2022-07-27 MED ORDER — AZITHROMYCIN 250 MG PO TABS: ORAL_TABLET | ORAL | 0 refills | Status: DC

## 2022-07-27 MED ORDER — FULVESTRANT 250 MG/5ML IM SOSY
500.0000 mg | PREFILLED_SYRINGE | Freq: Once | INTRAMUSCULAR | Status: AC
  Administered 2022-07-27: 500 mg via INTRAMUSCULAR
  Filled 2022-07-27: qty 10

## 2022-07-27 NOTE — Progress Notes (Signed)
Patient has upper respiratory problems that include dry cough with "heavy" feeling in chest for 1 week that started as a sore throat.  Has been taking OTC Mucinex and Delsym for symptoms.

## 2022-07-27 NOTE — Assessment & Plan Note (Addendum)
#  Recurrent breast cancer-ER positive PR negative ? HER-2/neu-currently on Faslodex plus abema. NOV 20th, 2023- CT CAP-  No definitive evidence to suggest recurrent or metastatic disease in the chest, abdomen or pelvis.    # Continue Faslodex; Currently on  Abema 100 BID; Labs today-WBC-2.5 ; ANC-1.0; Hb-110.8 platlets-N-  STABLE. HOLD verzinio given URI symptoms- see below. Patient having issues with assistance with Faslodex-starting 2024. If issues consider AI. Pt discussed with Pharmacy social workers.   # Cough/ sore throat- ? Acute bronchitis- HOLD verzinio. X1 week.  Plan Azithromycin. Mucinex DM BID.   # Hyponatremia [131]/ elevated creatinine [1.2]-sec to poor hydration/diarrhea- ? abema- recommend increasing fluids/salt intake electrolytes. STABLE.    # Hypomagnesia:. Monitor for now. STABLE.   # DM- BG- 233 s/p endocrinology evaluation; with diet and medications- STABLE.  # recurrent UTI/ ? Suprapubic tenderness- S/p eval with urology- STABLE.  Monitor closely on Abema/with neutropenia- STABLE.   # Hypercalcemia: Ca 10.4-resolved- continue just vit D- STABLE.   #DISPOSITION: **early AM appt # Faslodex today # Follow up in 4 weeksMD/ labs [CBC/CMP/Ca 27-29] Faslodex- Dr.B

## 2022-07-27 NOTE — Progress Notes (Signed)
.[place] OFFICE PROGRESS NOTE  Patient Care Team: Ezequiel Kayser, MD as PCP - General (Internal Medicine) Neldon Mc, MD (Inactive) (General Surgery) Everlene Farrier, MD (Obstetrics and Gynecology) Noreene Filbert, MD Forest Gleason, MD (Unknown Physician Specialty) [name], MD as Consulting Physician (Hematology and Oncology)   Cancer Staging  Carcinoma of lower-outer quadrant of right breast in female, estrogen receptor positive [facility]) Staging form: Breast, AJCC 7th Edition - Pathologic: ypT1c,ypN2a, MX - Signed by Haywood Lasso, MD on 03/10/2012 Cancer stage: ypT1c,ypN2a, MX    Oncology History Overview Note  # DEC 2012- RIGHT BREAST CA T2 N2 M0 tumor from biopsy.  Estrogen receptor positive, Progesterone receptor positive.  Current receptor negative by FISH 2. Neoadjuvant chemotherapy started in December of 28 with Cytoxan Adriamycin 3. Started on Taxol weekly chemotherapy. 4. Patient finished 12  cycles of Taxol chemotherapy in May of 2013.     5. Status post right modified radical mastectomy [Dr.Bowers; [place]] June of 2013, ypT1c  yp N2  MO. started also on Lerazole    7. Radiation therapy to the right breast (September of 2013).  Lymph node was positive for HER-2 receptor gene amplification of 2.22.  Will proceed with Herceptin treatment starting in September of 2013.   8.Patient has finished Herceptin (maintenance therapy) in August of 2014 8. Start patient on letrozole from November, 2013. 9. Patient started on Herceptin in September 2013.    10Patient finished 12 months of Herceptin therapy on August, 2014  # 6. Status post left side prophylactic mastectomy.  #Late MAY 2018-RECURRENCE BREAST CA- ER positive/PR negative; ?? HER-2/neu- [biopsy- proven-mediastinal lymph node; in suff for her 2 testing].  [elevated Tumor marker- CT/PET- uptake in Right Mediastinal LN; Sternum [June 2018 EBUS- Dr.Kasa]   # March 10 2017- faslodex +  Abema; OCT 5th CT-PR of mediastinal LN [consent]  #August 2020-left chest wall nodule biopsy benign  #August 2019- DVT-right upper extremity; Eliquis; stop end of March 2021 [repeat ultrasound negative/patient preference]; # JAN 2022-Cirrhosis- ? On CT scan-FEB 2022MRI liver- Negative fro overt cirrhosis --------------------------------------------------------------- -    DIAGNOSIS: [ BREAST CANCER- ER/PR/HER2 NEU POS  STAGE:  4   ;GOALS: PALLIATIVE  CURRENT/MOST RECENT THERAPY - ABEMA + FASLODEX    Carcinoma of lower-outer quadrant of right breast in female, estrogen receptor positive ([place])   INTERVAL HISTORY: Alone.  Ambulating independently.  Kimberly [name] 66 y.o.  female pleasant patient above history of metastatic ER PR positive HER-2/neu positive breast cancer currently on abema+ Faslodex is here for follow-up/review results of the restaging CAT scan.  Patient has upper respiratory problems that include dry cough with "heavy" feeling in chest for 1 week that started as a sore throat.  Has been taking OTC Mucinex and Delsym for symptoms.  Denies any worsening pain.  No fevers or chills.  No shortness of breath or cough. No nausea or vomiting.   Review of Systems  Constitutional:  Positive for malaise/fatigue. Negative for chills, diaphoresis, fever and weight loss.  HENT:  Negative for nosebleeds and sore throat.   Eyes:  Negative for double vision.  Respiratory:  Negative for cough, hemoptysis, sputum production, shortness of breath and wheezing.   Cardiovascular:  Negative for chest pain, palpitations, orthopnea and leg swelling.  Gastrointestinal:  Negative for abdominal pain, blood in stool, constipation, heartburn, melena, nausea and vomiting.  Musculoskeletal:  Positive for back pain and joint pain.  Neurological:  Negative for dizziness, tingling, focal weakness, weakness and headaches.  Endo/Heme/Allergies:  Does not bruise/bleed easily.  Psychiatric/Behavioral:   Negative for depression. The patient is not nervous/anxious and does not have insomnia.       PAST MEDICAL HISTORY :  Past Medical History:  Diagnosis Date   Arthritis    Cancer of lower-outer quadrant of female breast ([place]) 08/06/2011   RIGHT, chemo and bilateral mastectomies.   High cholesterol    History of kidney stones    Hx of bilateral breast implants    Hypertension    Lung metastases 20118   chemo pills and faslidex shots.   PONV (postoperative nausea and vomiting)    Type II diabetes mellitus ([place])    fasting 140-150   Vertigo    LAST WEEK   Vertigo 01/2017    PAST SURGICAL HISTORY :   Past Surgical History:  Procedure Laterality Date   BREAST BIOPSY  07/2011, 2020   right   BREAST RECONSTRUCTION  03/07/2012   Procedure: BREAST RECONSTRUCTION;  Surgeon: Crissie Reese, MD;  Location: [facility];  Service: Plastics;  Laterality: Left;  BREAST RECONSTRUCTION WITH PLACEMENT OF TISSUE EXPANDER TO LEFT BREAST   BREAST RECONSTRUCTION  02/06/2013   CESAREAN SECTION  [id number]   ENDOBRONCHIAL ULTRASOUND N/A 02/24/2017   Procedure: ENDOBRONCHIAL ULTRASOUND;  Surgeon: [name], MD;  Location: [facility];  Service: Cardiopulmonary;  Laterality: N/A;   LATISSIMUS FLAP TO BREAST Right 02/06/2013   Procedure: LATISSIMUS FLAP TO RIGHT BREAST WITH IMPLANT;  Surgeon: Crissie Reese, MD;  Location: [facility];  Service: Plastics;  Laterality: Right;   MASTECTOMY  03/07/12   modified right; total left   MODIFIED MASTECTOMY  03/07/2012   Procedure: MODIFIED MASTECTOMY;  Surgeon: Haywood Lasso, MD;  Location: [facility];  Service: General;  Laterality: Right;   PORTACATH PLACEMENT  07/2011   SCAR REVISION  03/30/2012   Procedure: SCAR REVISION;  Surgeon: Haywood Lasso, MD;  Location: [place];  Service: General;  Laterality: Right;  CLOSURE OF MASTECTOMY INCISION   WISDOM TOOTH EXTRACTION      FAMILY HISTORY :   Family History  Problem Relation Age of Onset   Breast cancer  Mother    Diabetes Father     SOCIAL HISTORY:   Social History   Tobacco Use   Smoking status: Never   Smokeless tobacco: Never  Vaping Use   Vaping Use: Never used  Substance Use Topics   Alcohol use: No   Drug use: No    ALLERGIES:  is allergic to sulfa antibiotics.  MEDICATIONS:  Current Outpatient Medications  Medication Sig Dispense Refill   abemaciclib (VERZENIO) 100 MG tablet TAKE 1 TABLET BY MOUTH TWICE DAILY. DO NOT CHEW, CRUSH, OR SPLIT TABLETS BEFORE SWALLOWING 56 tablet 6   atorvastatin (LIPITOR) 20 MG tablet Take 1 tablet (20 mg total) by mouth daily. 90 tablet 3   azithromycin (ZITHROMAX) 250 MG tablet Take 2 on day 1; and then 1 pill once a day. 6 each 0   Cholecalciferol 50 MCG (2000 UT) CAPS Take 3 capsules daily for 3 months, then reduce to 1 capsule daily thereafter for Vitamin D Deficiency.     clotrimazole-betamethasone (LOTRISONE) cream SMARTSIG:Topical Morning-Evening     fulvestrant (FASLODEX) 250 MG/5ML injection Inject into the muscle every 30 (thirty) days. One injection each buttock over 1-2 minutes. Warm prior to use.     glipiZIDE (GLUCOTROL XL) 10 MG 24 hr tablet Take 1 tablet (10 mg total) by mouth 2 (two) times daily. 180 tablet  3   insulin aspart (NOVOLOG FLEXPEN) 100 UNIT/ML FlexPen Inject 4 Units into the skin 3 (three) times daily with meals. 15 mL 0   Insulin Detemir (LEVEMIR) 100 UNIT/ML Pen Inject 20 Units into the skin at bedtime. 15 mL 0   levothyroxine (SYNTHROID) 25 MCG tablet Take 1 tablet (25 mcg total) by mouth daily before breakfast. 90 tablet 1   lisinopril (ZESTRIL) 10 MG tablet Take 10 mg by mouth daily.     loperamide (IMODIUM A-D) 2 MG tablet Take 2 mg by mouth 4 (four) times daily as needed for diarrhea or loose stools.     magnesium oxide (MAG-OX) 400 MG tablet Take 1 tablet by mouth daily.     ondansetron (ZOFRAN-ODT) 4 MG disintegrating tablet Take 1 tablet (4 mg total) by mouth every 8 (eight) hours as needed for nausea or  vomiting. 20 tablet 3   TRULICITY 0.27 XA/1.2IN SOPN SMARTSIG:0.5 Milliliter(s) SUB-Q Once a Week     No current facility-administered medications for this visit.    PHYSICAL EXAMINATION: ECOG PERFORMANCE STATUS: 1 - Symptomatic but completely ambulatory  BP 110/77 (BP Location: Left Arm, Patient Position: Sitting)   Pulse (!) 106   Temp 98.3 F (36.8 C) (Tympanic)   Resp 18   Wt 164 lb 12.8 oz (74.8 kg)   BMI 29.66 kg/m   Filed Weights   07/27/22 0900  Weight: 164 lb 12.8 oz (74.8 kg)      Physical Exam Constitutional:      Comments: She is alone.   HENT:     Head: Normocephalic and atraumatic.     Mouth/Throat:     Pharynx: No oropharyngeal exudate.  Eyes:     Pupils: Pupils are equal, round, and reactive to light.  Cardiovascular:     Rate and Rhythm: Normal rate and regular rhythm.  Pulmonary:     Effort: Pulmonary effort is normal. No respiratory distress.     Breath sounds: Normal breath sounds. No wheezing.  Abdominal:     General: Bowel sounds are normal. There is no distension.     Palpations: Abdomen is soft. There is no mass.     Tenderness: There is no abdominal tenderness. There is no guarding or rebound.  Musculoskeletal:        General: No tenderness. Normal range of motion.     Cervical back: Normal range of motion and neck supple.     Comments: Approximately 1 cm hard nodule felt in the anterior chest wall/left parasternal.  Skin:    General: Skin is warm.  Neurological:     Mental Status: She is alert and oriented to person, place, and time.  Psychiatric:        Mood and Affect: Affect normal.    Maculopapular rash mostly on the left lower extremity-mild on left lower; bilateral upper extremities  LABORATORY DATA:  I have reviewed the data as listed    Component Value Date/Time   NA 131 (L) 07/27/2022 0851   NA 135 11/28/2014 1401   K 3.4 (L) 07/27/2022 0851   K 4.7 11/28/2014 1401   CL 103 07/27/2022 0851   CL 99 (L) 11/28/2014 1401    CO2 22 07/27/2022 0851   CO2 28 11/28/2014 1401   GLUCOSE 233 (H) 07/27/2022 0851   GLUCOSE 215 (H) 11/28/2014 1401   BUN 16 07/27/2022 0851   BUN 16 11/28/2014 1401   CREATININE 1.09 (H) 07/27/2022 0851   CREATININE 0.96 11/28/2014 1401   CALCIUM 9.1  07/27/2022 0851   CALCIUM 9.9 11/28/2014 1401   PROT 7.3 07/27/2022 0851   PROT 7.6 11/28/2014 1401   ALBUMIN 3.5 07/27/2022 0851   ALBUMIN 4.5 11/28/2014 1401   AST 46 (H) 07/27/2022 0851   AST 26 11/28/2014 1401   ALT 29 07/27/2022 0851   ALT 28 11/28/2014 1401   ALKPHOS 76 07/27/2022 0851   ALKPHOS 78 11/28/2014 1401   BILITOT 0.6 07/27/2022 0851   BILITOT 0.5 11/28/2014 1401   GFRNONAA 56 (L) 07/27/2022 0851   GFRNONAA >60 11/28/2014 1401   GFRAA >60 05/30/2020 1022   GFRAA >60 11/28/2014 1401    No results found for: "SPEP", "UPEP"  Lab Results  Component Value Date   WBC 2.8 (L) 07/27/2022   NEUTROABS 1.0 (L) 07/27/2022   HGB 11.3 (L) 07/27/2022   HCT 32.5 (L) 07/27/2022   MCV 100.3 (H) 07/27/2022   PLT 232 07/27/2022      Chemistry      Component Value Date/Time   NA 131 (L) 07/27/2022 0851   NA 135 11/28/2014 1401   K 3.4 (L) 07/27/2022 0851   K 4.7 11/28/2014 1401   CL 103 07/27/2022 0851   CL 99 (L) 11/28/2014 1401   CO2 22 07/27/2022 0851   CO2 28 11/28/2014 1401   BUN 16 07/27/2022 0851   BUN 16 11/28/2014 1401   CREATININE 1.09 (H) 07/27/2022 0851   CREATININE 0.96 11/28/2014 1401      Component Value Date/Time   CALCIUM 9.1 07/27/2022 0851   CALCIUM 9.9 11/28/2014 1401   ALKPHOS 76 07/27/2022 0851   ALKPHOS 78 11/28/2014 1401   AST 46 (H) 07/27/2022 0851   AST 26 11/28/2014 1401   ALT 29 07/27/2022 0851   ALT 28 11/28/2014 1401   BILITOT 0.6 07/27/2022 0851   BILITOT 0.5 11/28/2014 1401       RADIOGRAPHIC STUDIES: I have personally reviewed the radiological images as listed and agreed with the findings in the report. No results found.   ASSESSMENT & PLAN:  Carcinoma of  lower-outer quadrant of right breast in female, estrogen receptor positive ([place])  # Recurrent breast cancer-ER positive PR negative ? HER-2/neu-currently on Faslodex plus abema. NOV 20th, 2023- CT CAP-  No definitive evidence to suggest recurrent or metastatic disease in the chest, abdomen or pelvis.    # Continue Faslodex; Currently on  Abema 100 BID; Labs today-WBC-2.5 ; ANC-1.0; Hb-110.8 platlets-N-  STABLE. HOLD verzinio given URI symptoms- see below. Patient having issues with assistance with Faslodex-starting 2024. If issues consider AI. Pt discussed with Pharmacy social workers.   # Cough/ sore throat- ? Acute bronchitis- HOLD verzinio. X1 week.  Plan Azithromycin. Mucinex DM BID.   # Hyponatremia [131]/ elevated creatinine [1.2]-sec to poor hydration/diarrhea- ? abema- recommend increasing fluids/salt intake electrolytes. STABLE.    # Hypomagnesia:. Monitor for now. STABLE.   # DM- BG- 233 s/p endocrinology evaluation; with diet and medications- STABLE.  # recurrent UTI/ ? Suprapubic tenderness- S/p eval with urology- STABLE.  Monitor closely on Abema/with neutropenia- STABLE.   # Hypercalcemia: Ca 10.4-resolved- continue just vit D- STABLE.   #DISPOSITION: **early AM appt # Faslodex today # Follow up in 4 weeksMD/ labs [CBC/CMP/Ca 27-29] Faslodex- Dr.B         Orders Placed This Encounter  Procedures   Comprehensive metabolic panel    Standing Status:   Future    Standing Expiration Date:   07/28/2023   CBC with Differential/Platelet    Standing  Status:   Future    Standing Expiration Date:   07/28/2023   Cancer antigen 27.29    Standing Status:   Future    Standing Expiration Date:   07/28/2023      All questions were answered. The patient knows to call the clinic with any problems, questions or concerns.      [name], MD 07/27/2022 11:57 AM

## 2022-07-27 NOTE — Patient Instructions (Signed)
#   Recommend Mucinex DM twice a day for 1 week  # Hold Verzinio for 1 week or until the symptoms improved.  # Take antibiotic as prescribed.

## 2022-07-28 LAB — CANCER ANTIGEN 27.29: CA 27.29: 27.9 U/mL (ref 0.0–38.6)

## 2022-08-24 ENCOUNTER — Inpatient Hospital Stay: Payer: PPO

## 2022-08-24 ENCOUNTER — Encounter: Payer: Self-pay | Admitting: Internal Medicine

## 2022-08-24 ENCOUNTER — Inpatient Hospital Stay: Payer: PPO | Admitting: Internal Medicine

## 2022-08-24 ENCOUNTER — Inpatient Hospital Stay: Payer: PPO | Attending: Internal Medicine

## 2022-08-24 VITALS — BP 121/79 | HR 92 | Temp 97.1°F | Resp 16 | Ht 62.5 in | Wt 165.6 lb

## 2022-08-24 DIAGNOSIS — C7800 Secondary malignant neoplasm of unspecified lung: Secondary | ICD-10-CM | POA: Insufficient documentation

## 2022-08-24 DIAGNOSIS — I10 Essential (primary) hypertension: Secondary | ICD-10-CM | POA: Insufficient documentation

## 2022-08-24 DIAGNOSIS — Z803 Family history of malignant neoplasm of breast: Secondary | ICD-10-CM | POA: Diagnosis not present

## 2022-08-24 DIAGNOSIS — Z86718 Personal history of other venous thrombosis and embolism: Secondary | ICD-10-CM | POA: Insufficient documentation

## 2022-08-24 DIAGNOSIS — E119 Type 2 diabetes mellitus without complications: Secondary | ICD-10-CM | POA: Insufficient documentation

## 2022-08-24 DIAGNOSIS — Z79899 Other long term (current) drug therapy: Secondary | ICD-10-CM | POA: Insufficient documentation

## 2022-08-24 DIAGNOSIS — E871 Hypo-osmolality and hyponatremia: Secondary | ICD-10-CM | POA: Diagnosis not present

## 2022-08-24 DIAGNOSIS — Z794 Long term (current) use of insulin: Secondary | ICD-10-CM | POA: Diagnosis not present

## 2022-08-24 DIAGNOSIS — C50511 Malignant neoplasm of lower-outer quadrant of right female breast: Secondary | ICD-10-CM | POA: Insufficient documentation

## 2022-08-24 DIAGNOSIS — Z7901 Long term (current) use of anticoagulants: Secondary | ICD-10-CM | POA: Insufficient documentation

## 2022-08-24 DIAGNOSIS — Z7984 Long term (current) use of oral hypoglycemic drugs: Secondary | ICD-10-CM | POA: Insufficient documentation

## 2022-08-24 DIAGNOSIS — Z9013 Acquired absence of bilateral breasts and nipples: Secondary | ICD-10-CM | POA: Diagnosis not present

## 2022-08-24 DIAGNOSIS — Z170 Estrogen receptor positive status [ER+]: Secondary | ICD-10-CM | POA: Diagnosis not present

## 2022-08-24 DIAGNOSIS — Z7985 Long-term (current) use of injectable non-insulin antidiabetic drugs: Secondary | ICD-10-CM | POA: Insufficient documentation

## 2022-08-24 DIAGNOSIS — Z5111 Encounter for antineoplastic chemotherapy: Secondary | ICD-10-CM | POA: Diagnosis present

## 2022-08-24 LAB — CBC WITH DIFFERENTIAL/PLATELET
Abs Immature Granulocytes: 0.02 10*3/uL (ref 0.00–0.07)
Basophils Absolute: 0 10*3/uL (ref 0.0–0.1)
Basophils Relative: 1 %
Eosinophils Absolute: 0.1 10*3/uL (ref 0.0–0.5)
Eosinophils Relative: 2 %
HCT: 30.9 % — ABNORMAL LOW (ref 36.0–46.0)
Hemoglobin: 10.7 g/dL — ABNORMAL LOW (ref 12.0–15.0)
Immature Granulocytes: 1 %
Lymphocytes Relative: 53 %
Lymphs Abs: 1.3 10*3/uL (ref 0.7–4.0)
MCH: 34.5 pg — ABNORMAL HIGH (ref 26.0–34.0)
MCHC: 34.6 g/dL (ref 30.0–36.0)
MCV: 99.7 fL (ref 80.0–100.0)
Monocytes Absolute: 0.2 10*3/uL (ref 0.1–1.0)
Monocytes Relative: 7 %
Neutro Abs: 0.9 10*3/uL — ABNORMAL LOW (ref 1.7–7.7)
Neutrophils Relative %: 36 %
Platelets: 156 10*3/uL (ref 150–400)
RBC: 3.1 MIL/uL — ABNORMAL LOW (ref 3.87–5.11)
RDW: 13.4 % (ref 11.5–15.5)
WBC: 2.5 10*3/uL — ABNORMAL LOW (ref 4.0–10.5)
nRBC: 0 % (ref 0.0–0.2)

## 2022-08-24 LAB — COMPREHENSIVE METABOLIC PANEL
ALT: 26 U/L (ref 0–44)
AST: 35 U/L (ref 15–41)
Albumin: 3.7 g/dL (ref 3.5–5.0)
Alkaline Phosphatase: 87 U/L (ref 38–126)
Anion gap: 5 (ref 5–15)
BUN: 22 mg/dL (ref 8–23)
CO2: 24 mmol/L (ref 22–32)
Calcium: 9.9 mg/dL (ref 8.9–10.3)
Chloride: 107 mmol/L (ref 98–111)
Creatinine, Ser: 1.3 mg/dL — ABNORMAL HIGH (ref 0.44–1.00)
GFR, Estimated: 45 mL/min — ABNORMAL LOW (ref >60)
Glucose, Bld: 144 mg/dL — ABNORMAL HIGH (ref 70–99)
Potassium: 3.9 mmol/L (ref 3.5–5.1)
Sodium: 136 mmol/L (ref 135–145)
Total Bilirubin: 0.6 mg/dL (ref 0.3–1.2)
Total Protein: 6.9 g/dL (ref 6.5–8.1)

## 2022-08-24 MED ORDER — FULVESTRANT 250 MG/5ML IM SOSY
500.0000 mg | PREFILLED_SYRINGE | Freq: Once | INTRAMUSCULAR | Status: AC
  Administered 2022-08-24: 500 mg via INTRAMUSCULAR
  Filled 2022-08-24: qty 10

## 2022-08-24 NOTE — Progress Notes (Signed)
Patient denies new problems/concerns today.   °

## 2022-08-24 NOTE — Progress Notes (Signed)
.[facility] OFFICE PROGRESS NOTE  Patient Care Team: Ezequiel Kayser, MD as PCP - General (Internal Medicine) Neldon Mc, MD (Inactive) (General Surgery) Everlene Farrier, MD (Obstetrics and Gynecology) Noreene Filbert, MD Forest Gleason, MD (Unknown Physician Specialty) [name], MD as Consulting Physician (Hematology and Oncology)   Cancer Staging  Carcinoma of lower-outer quadrant of right breast in female, estrogen receptor positive [facility]) Staging form: Breast, AJCC 7th Edition - Pathologic: ypT1c,ypN2a, MX - Signed by Haywood Lasso, MD on 03/10/2012 Cancer stage: ypT1c,ypN2a, MX    Oncology History Overview Note  # DEC 2012- RIGHT BREAST CA T2 N2 M0 tumor from biopsy.  Estrogen receptor positive, Progesterone receptor positive.  Current receptor negative by FISH 2. Neoadjuvant chemotherapy started in December of 28 with Cytoxan Adriamycin 3. Started on Taxol weekly chemotherapy. 4. Patient finished 12  cycles of Taxol chemotherapy in May of 2013.     5. Status post right modified radical mastectomy [Dr.Bowers; [place]] June of 2013, ypT1c  yp N2  MO. started also on Lerazole    7. Radiation therapy to the right breast (September of 2013).  Lymph node was positive for HER-2 receptor gene amplification of 2.22.  Will proceed with Herceptin treatment starting in September of 2013.   8.Patient has finished Herceptin (maintenance therapy) in August of 2014 8. Start patient on letrozole from November, 2013. 9. Patient started on Herceptin in September 2013.    10Patient finished 12 months of Herceptin therapy on August, 2014  # 6. Status post left side prophylactic mastectomy.  #Late MAY 2018-RECURRENCE BREAST CA- ER positive/PR negative; ?? HER-2/neu- [biopsy- proven-mediastinal lymph node; in suff for her 2 testing].  [elevated Tumor marker- CT/PET- uptake in Right Mediastinal LN; Sternum [June 2018 EBUS- Dr.Kasa]   # March 10 2017- faslodex +  Abema; OCT 5th CT-PR of mediastinal LN [consent]  #August 2020-left chest wall nodule biopsy benign  #August 2019- DVT-right upper extremity; Eliquis; stop end of March 2021 [repeat ultrasound negative/patient preference]; # JAN 2022-Cirrhosis- ? On CT scan-FEB 2022MRI liver- Negative fro overt cirrhosis --------------------------------------------------------------- -    DIAGNOSIS: [ BREAST CANCER- ER/PR/HER2 NEU POS  STAGE:  4   ;GOALS: PALLIATIVE  CURRENT/MOST RECENT THERAPY - ABEMA + FASLODEX    Carcinoma of lower-outer quadrant of right breast in female, estrogen receptor positive ([place])   INTERVAL HISTORY: Alone.  Ambulating independently.  Kimberly [name] 66 y.o.  female pleasant patient above history of metastatic ER PR positive HER-2/neu positive breast cancer currently on abema+ Faslodex is here for follow-up.  Patient denies new problems/concerns today.  Patient's cough resolved after treated with azithromycin at last visit.   Denies any infections.  Denies any nausea vomiting no fever no chills.  Review of Systems  Constitutional:  Positive for malaise/fatigue. Negative for chills, diaphoresis, fever and weight loss.  HENT:  Negative for nosebleeds and sore throat.   Eyes:  Negative for double vision.  Respiratory:  Negative for cough, hemoptysis, sputum production, shortness of breath and wheezing.   Cardiovascular:  Negative for chest pain, palpitations, orthopnea and leg swelling.  Gastrointestinal:  Negative for abdominal pain, blood in stool, constipation, heartburn, melena, nausea and vomiting.  Musculoskeletal:  Positive for back pain and joint pain.  Neurological:  Negative for dizziness, tingling, focal weakness, weakness and headaches.  Endo/Heme/Allergies:  Does not bruise/bleed easily.  Psychiatric/Behavioral:  Negative for depression. The patient is not nervous/anxious and does not have insomnia.       PAST MEDICAL  HISTORY :  Past Medical History:   Diagnosis Date   Arthritis    Cancer of lower-outer quadrant of female breast ([place]) 08/06/2011   RIGHT, chemo and bilateral mastectomies.   High cholesterol    History of kidney stones    Hx of bilateral breast implants    Hypertension    Lung metastases 20118   chemo pills and faslidex shots.   PONV (postoperative nausea and vomiting)    Type II diabetes mellitus ([place])    fasting 140-150   Vertigo    LAST WEEK   Vertigo 01/2017    PAST SURGICAL HISTORY :   Past Surgical History:  Procedure Laterality Date   BREAST BIOPSY  07/2011, 2020   right   BREAST RECONSTRUCTION  03/07/2012   Procedure: BREAST RECONSTRUCTION;  Surgeon: Crissie Reese, MD;  Location: [facility];  Service: Plastics;  Laterality: Left;  BREAST RECONSTRUCTION WITH PLACEMENT OF TISSUE EXPANDER TO LEFT BREAST   BREAST RECONSTRUCTION  02/06/2013   CESAREAN SECTION  [id number]   ENDOBRONCHIAL ULTRASOUND N/A 02/24/2017   Procedure: ENDOBRONCHIAL ULTRASOUND;  Surgeon: [name], MD;  Location: [facility];  Service: Cardiopulmonary;  Laterality: N/A;   LATISSIMUS FLAP TO BREAST Right 02/06/2013   Procedure: LATISSIMUS FLAP TO RIGHT BREAST WITH IMPLANT;  Surgeon: Crissie Reese, MD;  Location: [facility];  Service: Plastics;  Laterality: Right;   MASTECTOMY  03/07/12   modified right; total left   MODIFIED MASTECTOMY  03/07/2012   Procedure: MODIFIED MASTECTOMY;  Surgeon: Haywood Lasso, MD;  Location: [facility];  Service: General;  Laterality: Right;   PORTACATH PLACEMENT  07/2011   SCAR REVISION  03/30/2012   Procedure: SCAR REVISION;  Surgeon: Haywood Lasso, MD;  Location: [facility];  Service: General;  Laterality: Right;  CLOSURE OF MASTECTOMY INCISION   WISDOM TOOTH EXTRACTION      FAMILY HISTORY :   Family History  Problem Relation Age of Onset   Breast cancer Mother    Diabetes Father     SOCIAL HISTORY:   Social History   Tobacco Use   Smoking status: Never   Smokeless tobacco: Never  Vaping Use    Vaping Use: Never used  Substance Use Topics   Alcohol use: No   Drug use: No    ALLERGIES:  is allergic to sulfa antibiotics.  MEDICATIONS:  Current Outpatient Medications  Medication Sig Dispense Refill   abemaciclib (VERZENIO) 100 MG tablet TAKE 1 TABLET BY MOUTH TWICE DAILY. DO NOT CHEW, CRUSH, OR SPLIT TABLETS BEFORE SWALLOWING 56 tablet 6   atorvastatin (LIPITOR) 20 MG tablet Take 1 tablet (20 mg total) by mouth daily. 90 tablet 3   Cholecalciferol 50 MCG (2000 UT) CAPS Take 3 capsules daily for 3 months, then reduce to 1 capsule daily thereafter for Vitamin D Deficiency.     clotrimazole-betamethasone (LOTRISONE) cream SMARTSIG:Topical Morning-Evening     fulvestrant (FASLODEX) 250 MG/5ML injection Inject into the muscle every 30 (thirty) days. One injection each buttock over 1-2 minutes. Warm prior to use.     glipiZIDE (GLUCOTROL XL) 10 MG 24 hr tablet Take 1 tablet (10 mg total) by mouth 2 (two) times daily. 180 tablet 3   insulin aspart (NOVOLOG FLEXPEN) 100 UNIT/ML FlexPen Inject 4 Units into the skin 3 (three) times daily with meals. 15 mL 0   Insulin Detemir (LEVEMIR) 100 UNIT/ML Pen Inject 20 Units into the skin at bedtime. 15 mL 0   levothyroxine (SYNTHROID) 25 MCG tablet Take  1 tablet (25 mcg total) by mouth daily before breakfast. 90 tablet 1   lisinopril (ZESTRIL) 10 MG tablet Take 10 mg by mouth daily.     loperamide (IMODIUM A-D) 2 MG tablet Take 2 mg by mouth 4 (four) times daily as needed for diarrhea or loose stools.     magnesium oxide (MAG-OX) 400 MG tablet Take 1 tablet by mouth daily.     ondansetron (ZOFRAN-ODT) 4 MG disintegrating tablet Take 1 tablet (4 mg total) by mouth every 8 (eight) hours as needed for nausea or vomiting. 20 tablet 3   TRULICITY 2.95 MW/4.1LK SOPN SMARTSIG:0.5 Milliliter(s) SUB-Q Once a Week     azithromycin (ZITHROMAX) 250 MG tablet Take 2 on day 1; and then 1 pill once a day. (Patient not taking: Reported on 08/24/2022) 6 each 0    No current facility-administered medications for this visit.    PHYSICAL EXAMINATION: ECOG PERFORMANCE STATUS: 1 - Symptomatic but completely ambulatory  BP 121/79 (BP Location: Left Arm, Patient Position: Sitting)   Pulse 92   Temp (!) 97.1 F (36.2 C) (Tympanic)   Resp 16   Ht 5' 2.5" (1.588 m)   Wt 165 lb 9.6 oz (75.1 kg)   BMI 29.81 kg/m   Filed Weights   08/24/22 0900  Weight: 165 lb 9.6 oz (75.1 kg)      Physical Exam Constitutional:      Comments: She is alone.   HENT:     Head: Normocephalic and atraumatic.     Mouth/Throat:     Pharynx: No oropharyngeal exudate.  Eyes:     Pupils: Pupils are equal, round, and reactive to light.  Cardiovascular:     Rate and Rhythm: Normal rate and regular rhythm.  Pulmonary:     Effort: Pulmonary effort is normal. No respiratory distress.     Breath sounds: Normal breath sounds. No wheezing.  Abdominal:     General: Bowel sounds are normal. There is no distension.     Palpations: Abdomen is soft. There is no mass.     Tenderness: There is no abdominal tenderness. There is no guarding or rebound.  Musculoskeletal:        General: No tenderness. Normal range of motion.     Cervical back: Normal range of motion and neck supple.     Comments: Approximately 1 cm hard nodule felt in the anterior chest wall/left parasternal.  Skin:    General: Skin is warm.  Neurological:     Mental Status: She is alert and oriented to person, place, and time.  Psychiatric:        Mood and Affect: Affect normal.    Maculopapular rash mostly on the left lower extremity-mild on left lower; bilateral upper extremities  LABORATORY DATA:  I have reviewed the data as listed    Component Value Date/Time   NA 136 08/24/2022 0910   NA 135 11/28/2014 1401   K 3.9 08/24/2022 0910   K 4.7 11/28/2014 1401   CL 107 08/24/2022 0910   CL 99 (L) 11/28/2014 1401   CO2 24 08/24/2022 0910   CO2 28 11/28/2014 1401   GLUCOSE 144 (H) 08/24/2022 0910    GLUCOSE 215 (H) 11/28/2014 1401   BUN 22 08/24/2022 0910   BUN 16 11/28/2014 1401   CREATININE 1.30 (H) 08/24/2022 0910   CREATININE 0.96 11/28/2014 1401   CALCIUM 9.9 08/24/2022 0910   CALCIUM 9.9 11/28/2014 1401   PROT 6.9 08/24/2022 0910   PROT 7.6 11/28/2014  1401   ALBUMIN 3.7 08/24/2022 0910   ALBUMIN 4.5 11/28/2014 1401   AST 35 08/24/2022 0910   AST 26 11/28/2014 1401   ALT 26 08/24/2022 0910   ALT 28 11/28/2014 1401   ALKPHOS 87 08/24/2022 0910   ALKPHOS 78 11/28/2014 1401   BILITOT 0.6 08/24/2022 0910   BILITOT 0.5 11/28/2014 1401   GFRNONAA 45 (L) 08/24/2022 0910   GFRNONAA >60 11/28/2014 1401   GFRAA >60 05/30/2020 1022   GFRAA >60 11/28/2014 1401    No results found for: "SPEP", "UPEP"  Lab Results  Component Value Date   WBC 2.5 (L) 08/24/2022   NEUTROABS 0.9 (L) 08/24/2022   HGB 10.7 (L) 08/24/2022   HCT 30.9 (L) 08/24/2022   MCV 99.7 08/24/2022   PLT 156 08/24/2022      Chemistry      Component Value Date/Time   NA 136 08/24/2022 0910   NA 135 11/28/2014 1401   K 3.9 08/24/2022 0910   K 4.7 11/28/2014 1401   CL 107 08/24/2022 0910   CL 99 (L) 11/28/2014 1401   CO2 24 08/24/2022 0910   CO2 28 11/28/2014 1401   BUN 22 08/24/2022 0910   BUN 16 11/28/2014 1401   CREATININE 1.30 (H) 08/24/2022 0910   CREATININE 0.96 11/28/2014 1401      Component Value Date/Time   CALCIUM 9.9 08/24/2022 0910   CALCIUM 9.9 11/28/2014 1401   ALKPHOS 87 08/24/2022 0910   ALKPHOS 78 11/28/2014 1401   AST 35 08/24/2022 0910   AST 26 11/28/2014 1401   ALT 26 08/24/2022 0910   ALT 28 11/28/2014 1401   BILITOT 0.6 08/24/2022 0910   BILITOT 0.5 11/28/2014 1401       RADIOGRAPHIC STUDIES: I have personally reviewed the radiological images as listed and agreed with the findings in the report. No results found.   ASSESSMENT & PLAN:  Carcinoma of lower-outer quadrant of right breast in female, estrogen receptor positive ([place])  # Recurrent breast cancer-ER  positive PR negative ? HER-2/neu-currently on Faslodex plus abema. NOV 20th, 2023- CT CAP-  No definitive evidence to suggest recurrent or metastatic disease in the chest, abdomen or pelvis.    # Continue Faslodex; Currently on  Abema 100 BID; Labs today-WBC-2.5 ; ANC-1.0; Hb-10-11; platelets-N-  STABLE.  Patient having issues with assistance with Faslodex-starting 2024. Will plan Aromatase inhibitor at next visit.   # Hyponatremia [131]/ elevated creatinine [1.2]-sec to poor hydration/diarrhea- ? abema- recommend increasing fluids/salt intake electrolytes. STABLE.    # Hypomagnesia: Monitor for now. STABLE.   # DM- BG- 144- s/p endocrinology evaluation; with diet and medications- STABLE.  # recurrent UTI/ ? Suprapubic tenderness- S/p eval with urology- STABLE.  Monitor closely on Abema/with neutropenia- STABLE.   # Hypercalcemia: Ca 10.4-resolved- continue just vit D- STABLE.   # IV access; PIV.   #DISPOSITION: **early AM appt # Faslodex today # Follow up in 4 weeksMD/ labs [CBC/CMP/Ca 27-29]  Dr.B         No orders of the defined types were placed in this encounter.     All questions were answered. The patient knows to call the clinic with any problems, questions or concerns.      [name], MD 08/24/2022 10:21 AM

## 2022-08-24 NOTE — Assessment & Plan Note (Addendum)
#  Recurrent breast cancer-ER positive PR negative ? HER-2/neu-currently on Faslodex plus abema. NOV 20th, 2023- CT CAP-  No definitive evidence to suggest recurrent or metastatic disease in the chest, abdomen or pelvis.    # Continue Faslodex; Currently on  Abema 100 BID; Labs today-WBC-2.5 ; ANC-1.0; Hb-10-11; platelets-N-  STABLE.  Patient having issues with assistance with Faslodex-starting 2024. Will plan Aromatase inhibitor at next visit.   # Hyponatremia [131]/ elevated creatinine [1.2]-sec to poor hydration/diarrhea- ? abema- recommend increasing fluids/salt intake electrolytes. STABLE.    # Hypomagnesia: Monitor for now. STABLE.   # DM- BG- 144- s/p endocrinology evaluation; with diet and medications- STABLE.  # recurrent UTI/ ? Suprapubic tenderness- S/p eval with urology- STABLE.  Monitor closely on Abema/with neutropenia- STABLE.   # Hypercalcemia: Ca 10.4-resolved- continue just vit D- STABLE.   # IV access; PIV.   #DISPOSITION: **early AM appt # Faslodex today # Follow up in 4 weeksMD/ labs [CBC/CMP/Ca 27-29]  Dr.B

## 2022-08-24 NOTE — Addendum Note (Signed)
Addended by: Vanice Sarah on: 08/24/2022 12:07 PM   Modules accepted: Orders

## 2022-08-25 LAB — CANCER ANTIGEN 27.29: CA 27.29: 29.4 U/mL (ref 0.0–38.6)

## 2022-08-25 NOTE — Telephone Encounter (Signed)
Called to check status of application per patient request. [employer] updated application after original was sent for processing. Submitted updated application for processing. I will continue to follow and update until final determination.   [name], [facility] Patient Kimberly [name]  [phone] (phone) [phone] (fax) 08/25/2022 1:17 PM

## 2022-08-26 NOTE — Telephone Encounter (Signed)
Oral Oncology Patient Advocate Encounter   Received notification re-enrollment for assistance for Verzenio through [facility] has been approved. Patient may continue to receive their medication at $0 from this program.    [employer] phone number [phone].   Effective dates: 01.01.24 through 12.31.24  I have spoken to the patient.  [name], [facility] Patient [place]  [phone] (phone) [phone] (fax) 08/26/2022 10:06 AM

## 2022-09-06 ENCOUNTER — Encounter: Payer: Self-pay | Admitting: Internal Medicine

## 2022-09-15 DIAGNOSIS — Z170 Estrogen receptor positive status [ER+]: Secondary | ICD-10-CM | POA: Diagnosis not present

## 2022-09-15 DIAGNOSIS — C50919 Malignant neoplasm of unspecified site of unspecified female breast: Secondary | ICD-10-CM | POA: Diagnosis not present

## 2022-09-15 DIAGNOSIS — T451X5A Adverse effect of antineoplastic and immunosuppressive drugs, initial encounter: Secondary | ICD-10-CM | POA: Insufficient documentation

## 2022-09-15 DIAGNOSIS — I700 Atherosclerosis of aorta: Secondary | ICD-10-CM | POA: Diagnosis not present

## 2022-09-15 DIAGNOSIS — N182 Chronic kidney disease, stage 2 (mild): Secondary | ICD-10-CM | POA: Diagnosis not present

## 2022-09-15 DIAGNOSIS — E538 Deficiency of other specified B group vitamins: Secondary | ICD-10-CM | POA: Diagnosis not present

## 2022-09-15 DIAGNOSIS — E039 Hypothyroidism, unspecified: Secondary | ICD-10-CM | POA: Diagnosis not present

## 2022-09-15 DIAGNOSIS — C50511 Malignant neoplasm of lower-outer quadrant of right female breast: Secondary | ICD-10-CM | POA: Diagnosis not present

## 2022-09-15 DIAGNOSIS — Z794 Long term (current) use of insulin: Secondary | ICD-10-CM | POA: Diagnosis not present

## 2022-09-15 DIAGNOSIS — E559 Vitamin D deficiency, unspecified: Secondary | ICD-10-CM | POA: Diagnosis not present

## 2022-09-15 DIAGNOSIS — M8589 Other specified disorders of bone density and structure, multiple sites: Secondary | ICD-10-CM | POA: Diagnosis not present

## 2022-09-15 DIAGNOSIS — E1122 Type 2 diabetes mellitus with diabetic chronic kidney disease: Secondary | ICD-10-CM | POA: Diagnosis not present

## 2022-09-15 DIAGNOSIS — K760 Fatty (change of) liver, not elsewhere classified: Secondary | ICD-10-CM | POA: Diagnosis not present

## 2022-09-15 DIAGNOSIS — E1159 Type 2 diabetes mellitus with other circulatory complications: Secondary | ICD-10-CM | POA: Diagnosis not present

## 2022-09-15 DIAGNOSIS — I152 Hypertension secondary to endocrine disorders: Secondary | ICD-10-CM | POA: Diagnosis not present

## 2022-09-15 DIAGNOSIS — Z0000 Encounter for general adult medical examination without abnormal findings: Secondary | ICD-10-CM | POA: Diagnosis not present

## 2022-09-15 DIAGNOSIS — E782 Mixed hyperlipidemia: Secondary | ICD-10-CM | POA: Diagnosis not present

## 2022-09-15 DIAGNOSIS — E1169 Type 2 diabetes mellitus with other specified complication: Secondary | ICD-10-CM | POA: Diagnosis not present

## 2022-09-21 ENCOUNTER — Inpatient Hospital Stay: Payer: PPO

## 2022-09-21 ENCOUNTER — Inpatient Hospital Stay (HOSPITAL_BASED_OUTPATIENT_CLINIC_OR_DEPARTMENT_OTHER): Payer: PPO | Admitting: Internal Medicine

## 2022-09-21 ENCOUNTER — Inpatient Hospital Stay: Payer: PPO | Attending: Internal Medicine

## 2022-09-21 ENCOUNTER — Encounter: Payer: Self-pay | Admitting: Internal Medicine

## 2022-09-21 VITALS — BP 118/80 | HR 103 | Temp 96.4°F | Resp 16 | Wt 168.1 lb

## 2022-09-21 DIAGNOSIS — Z7982 Long term (current) use of aspirin: Secondary | ICD-10-CM | POA: Insufficient documentation

## 2022-09-21 DIAGNOSIS — I10 Essential (primary) hypertension: Secondary | ICD-10-CM | POA: Insufficient documentation

## 2022-09-21 DIAGNOSIS — Z7984 Long term (current) use of oral hypoglycemic drugs: Secondary | ICD-10-CM | POA: Diagnosis not present

## 2022-09-21 DIAGNOSIS — E871 Hypo-osmolality and hyponatremia: Secondary | ICD-10-CM | POA: Diagnosis not present

## 2022-09-21 DIAGNOSIS — E538 Deficiency of other specified B group vitamins: Secondary | ICD-10-CM | POA: Insufficient documentation

## 2022-09-21 DIAGNOSIS — Z79811 Long term (current) use of aromatase inhibitors: Secondary | ICD-10-CM | POA: Diagnosis not present

## 2022-09-21 DIAGNOSIS — Z9013 Acquired absence of bilateral breasts and nipples: Secondary | ICD-10-CM | POA: Diagnosis not present

## 2022-09-21 DIAGNOSIS — Z7901 Long term (current) use of anticoagulants: Secondary | ICD-10-CM | POA: Insufficient documentation

## 2022-09-21 DIAGNOSIS — Z170 Estrogen receptor positive status [ER+]: Secondary | ICD-10-CM

## 2022-09-21 DIAGNOSIS — E119 Type 2 diabetes mellitus without complications: Secondary | ICD-10-CM | POA: Insufficient documentation

## 2022-09-21 DIAGNOSIS — C50511 Malignant neoplasm of lower-outer quadrant of right female breast: Secondary | ICD-10-CM

## 2022-09-21 DIAGNOSIS — Z79899 Other long term (current) drug therapy: Secondary | ICD-10-CM | POA: Insufficient documentation

## 2022-09-21 DIAGNOSIS — Z794 Long term (current) use of insulin: Secondary | ICD-10-CM | POA: Insufficient documentation

## 2022-09-21 DIAGNOSIS — C7800 Secondary malignant neoplasm of unspecified lung: Secondary | ICD-10-CM | POA: Diagnosis not present

## 2022-09-21 DIAGNOSIS — Z7985 Long-term (current) use of injectable non-insulin antidiabetic drugs: Secondary | ICD-10-CM | POA: Diagnosis not present

## 2022-09-21 LAB — COMPREHENSIVE METABOLIC PANEL
ALT: 31 U/L (ref 0–44)
AST: 40 U/L (ref 15–41)
Albumin: 3.5 g/dL (ref 3.5–5.0)
Alkaline Phosphatase: 70 U/L (ref 38–126)
Anion gap: 11 (ref 5–15)
BUN: 18 mg/dL (ref 8–23)
CO2: 25 mmol/L (ref 22–32)
Calcium: 10.8 mg/dL — ABNORMAL HIGH (ref 8.9–10.3)
Chloride: 98 mmol/L (ref 98–111)
Creatinine, Ser: 1.19 mg/dL — ABNORMAL HIGH (ref 0.44–1.00)
GFR, Estimated: 50 mL/min — ABNORMAL LOW (ref >60)
Glucose, Bld: 84 mg/dL (ref 70–99)
Potassium: 3.9 mmol/L (ref 3.5–5.1)
Sodium: 134 mmol/L — ABNORMAL LOW (ref 135–145)
Total Bilirubin: 0.6 mg/dL (ref 0.3–1.2)
Total Protein: 7.1 g/dL (ref 6.5–8.1)

## 2022-09-21 LAB — CBC WITH DIFFERENTIAL/PLATELET
Abs Immature Granulocytes: 0.01 10*3/uL (ref 0.00–0.07)
Basophils Absolute: 0.1 10*3/uL (ref 0.0–0.1)
Basophils Relative: 2 %
Eosinophils Absolute: 0.1 10*3/uL (ref 0.0–0.5)
Eosinophils Relative: 2 %
HCT: 31.3 % — ABNORMAL LOW (ref 36.0–46.0)
Hemoglobin: 11 g/dL — ABNORMAL LOW (ref 12.0–15.0)
Immature Granulocytes: 0 %
Lymphocytes Relative: 43 %
Lymphs Abs: 1.7 10*3/uL (ref 0.7–4.0)
MCH: 34.9 pg — ABNORMAL HIGH (ref 26.0–34.0)
MCHC: 35.1 g/dL (ref 30.0–36.0)
MCV: 99.4 fL (ref 80.0–100.0)
Monocytes Absolute: 0.2 10*3/uL (ref 0.1–1.0)
Monocytes Relative: 6 %
Neutro Abs: 1.9 10*3/uL (ref 1.7–7.7)
Neutrophils Relative %: 47 %
Platelets: 193 10*3/uL (ref 150–400)
RBC: 3.15 MIL/uL — ABNORMAL LOW (ref 3.87–5.11)
RDW: 13 % (ref 11.5–15.5)
WBC: 4 10*3/uL (ref 4.0–10.5)
nRBC: 0 % (ref 0.0–0.2)

## 2022-09-21 MED ORDER — FULVESTRANT 250 MG/5ML IM SOSY
500.0000 mg | PREFILLED_SYRINGE | Freq: Once | INTRAMUSCULAR | Status: DC
  Filled 2022-09-21: qty 10

## 2022-09-21 MED ORDER — ANASTROZOLE 1 MG PO TABS: 1.0000 mg | ORAL_TABLET | Freq: Every day | ORAL | 4 refills | Status: DC

## 2022-09-21 NOTE — Assessment & Plan Note (Addendum)
#  Recurrent breast cancer-ER positive PR negative ? HER-2/neu-currently on Faslodex plus abema. NOV 20th, 2023- CT CAP-  No definitive evidence to suggest recurrent or metastatic disease in the chest, abdomen or pelvis. Discontinued faslodex sec to insurance issues.    #  Currently on  Abema 100 BID; Labs today-WBC-4.2 ; Hb-10-11; platelets-N-  stable.Will plan Anastrzole starting today.-New prescription sent.  Will get scans in [facility]; will order at next visit.   # Hyponatremia [131]/ elevated creatinine [1.2]-sec to poor hydration/diarrhea- ? abema- recommend increasing fluids/salt intake electrolytes. stable   # Hypomagnesia: 1.2- on OTC mag- stable.   # DM- BG- 67 s/p endocrinology evaluation; with diet and medications [trucilicity; Novolog]- stable.   # recurrent UTI/ ? Suprapubic tenderness- S/p eval with urology.  Monitor closely on Abema/with neutropenia- stable.   # Hypercalcemia: Ca 10.8-resolved- continue just vit D- monitor for now. Check Vit D levels  # Low B 12 [PCP]- on B12 PO.   # IV access; PIV.   #DISPOSITION: **early AM appt  # Follow up in 4 weeksMD/ labs [CBC/CMP/Ca 27-29; check vit D 25-OH]  Dr.B

## 2022-09-21 NOTE — Progress Notes (Signed)
.[facility] OFFICE PROGRESS NOTE  Patient Care Team: Ezequiel Kayser, MD as PCP - General (Internal Medicine) Neldon Mc, MD (Inactive) (General Surgery) Everlene Farrier, MD (Obstetrics and Gynecology) Noreene Filbert, MD Forest Gleason, MD (Unknown Physician Specialty) [name], MD as Consulting Physician (Hematology and Oncology)   Cancer Staging  Carcinoma of lower-outer quadrant of right breast in female, estrogen receptor positive [facility]) Staging form: Breast, AJCC 7th Edition - Pathologic: ypT1c,ypN2a, MX - Signed by Haywood Lasso, MD on 03/10/2012 Cancer stage: ypT1c,ypN2a, MX    Oncology History Overview Note  # DEC 2012- RIGHT BREAST CA T2 N2 M0 tumor from biopsy.  Estrogen receptor positive, Progesterone receptor positive.  Current receptor negative by FISH 2. Neoadjuvant chemotherapy started in December of 28 with Cytoxan Adriamycin 3. Started on Taxol weekly chemotherapy. 4. Patient finished 12  cycles of Taxol chemotherapy in May of 2013.     5. Status post right modified radical mastectomy [Dr.Bowers; [place]] June of 2013, ypT1c  yp N2  MO. started also on Lerazole    7. Radiation therapy to the right breast (September of 2013).  Lymph node was positive for HER-2 receptor gene amplification of 2.22.  Will proceed with Herceptin treatment starting in September of 2013.   8.Patient has finished Herceptin (maintenance therapy) in August of 2014 8. Start patient on letrozole from November, 2013. 9. Patient started on Herceptin in September 2013.    10Patient finished 12 months of Herceptin therapy on August, 2014  # 6. Status post left side prophylactic mastectomy.  #Late MAY 2018-RECURRENCE BREAST CA- ER positive/PR negative; ?? HER-2/neu- [biopsy- proven-mediastinal lymph node; in suff for her 2 testing].  [elevated Tumor marker- CT/PET- uptake in Right Mediastinal LN; Sternum [June 2018 EBUS- Dr.Kasa]   # March 10 2017- faslodex +  Abema; OCT 5th CT-PR of mediastinal LN [consent]; Discontinued faslodex sec to insurance issues [last [place] 2023]. JAN 2024- continue Abema + Anastrzole   #August 2020-left chest wall nodule biopsy benign  #August 2019- DVT-right upper extremity; Eliquis; stop end of March 2021 [repeat ultrasound negative/patient preference]; # JAN 2022-Cirrhosis- ? On CT scan-FEB 2022MRI liver- Negative fro overt cirrhosis --------------------------------------------------------------- -    DIAGNOSIS: [ BREAST CANCER- ER/PR/HER2 NEU POS  STAGE:  4   ;GOALS: PALLIATIVE     Carcinoma of lower-outer quadrant of right breast in female, estrogen receptor positive ([place])   INTERVAL HISTORY: Alone.  Ambulating independently.  Kimberly [name] 67 y.o.  female pleasant patient above history of metastatic ER PR positive HER-2/neu positive breast cancer currently on abema+ Faslodex is here for follow-up.  Having trouble with her feet that included pain, numbness and tingling.  PCP started her on Gabapentin 5 days ago. Magnesium level 1.2 from [facility] lab draw.  Has started OTC magnesium.   Vitamin B12 level was 80 on [place] lab draw.  Has started Vitamin B12 1000 mg 2 tab QD for 2 weeks then decrease to 1 tab QD as directed by PCP.    Denies any infections.  Denies any nausea vomiting no fever no chills.  Review of Systems  Constitutional:  Positive for malaise/fatigue. Negative for chills, diaphoresis, fever and weight loss.  HENT:  Negative for nosebleeds and sore throat.   Eyes:  Negative for double vision.  Respiratory:  Negative for cough, hemoptysis, sputum production, shortness of breath and wheezing.   Cardiovascular:  Negative for chest pain, palpitations, orthopnea and leg swelling.  Gastrointestinal:  Negative for abdominal pain, blood in  stool, constipation, heartburn, melena, nausea and vomiting.  Musculoskeletal:  Positive for back pain and joint pain.  Neurological:  Negative for dizziness, tingling,  focal weakness, weakness and headaches.  Endo/Heme/Allergies:  Does not bruise/bleed easily.  Psychiatric/Behavioral:  Negative for depression. The patient is not nervous/anxious and does not have insomnia.       PAST MEDICAL HISTORY :  Past Medical History:  Diagnosis Date   Arthritis    Cancer of lower-outer quadrant of female breast ([place]) 08/06/2011   RIGHT, chemo and bilateral mastectomies.   High cholesterol    History of kidney stones    Hx of bilateral breast implants    Hypertension    Lung metastases 20118   chemo pills and faslidex shots.   PONV (postoperative nausea and vomiting)    Type II diabetes mellitus ([place])    fasting 140-150   Vertigo    LAST WEEK   Vertigo 01/2017    PAST SURGICAL HISTORY :   Past Surgical History:  Procedure Laterality Date   BREAST BIOPSY  07/2011, 2020   right   BREAST RECONSTRUCTION  03/07/2012   Procedure: BREAST RECONSTRUCTION;  Surgeon: Crissie Reese, MD;  Location: [place];  Service: Plastics;  Laterality: Left;  BREAST RECONSTRUCTION WITH PLACEMENT OF TISSUE EXPANDER TO LEFT BREAST   BREAST RECONSTRUCTION  02/06/2013   CESAREAN SECTION  [id number]   ENDOBRONCHIAL ULTRASOUND N/A 02/24/2017   Procedure: ENDOBRONCHIAL ULTRASOUND;  Surgeon: [name], MD;  Location: [facility];  Service: Cardiopulmonary;  Laterality: N/A;   LATISSIMUS FLAP TO BREAST Right 02/06/2013   Procedure: LATISSIMUS FLAP TO RIGHT BREAST WITH IMPLANT;  Surgeon: Crissie Reese, MD;  Location: [facility];  Service: Plastics;  Laterality: Right;   MASTECTOMY  03/07/12   modified right; total left   MODIFIED MASTECTOMY  03/07/2012   Procedure: MODIFIED MASTECTOMY;  Surgeon: Haywood Lasso, MD;  Location: [place];  Service: General;  Laterality: Right;   PORTACATH PLACEMENT  07/2011   SCAR REVISION  03/30/2012   Procedure: SCAR REVISION;  Surgeon: Haywood Lasso, MD;  Location: [facility];  Service: General;  Laterality: Right;  CLOSURE OF MASTECTOMY INCISION    WISDOM TOOTH EXTRACTION      FAMILY HISTORY :   Family History  Problem Relation Age of Onset   Breast cancer Mother    Diabetes Father     SOCIAL HISTORY:   Social History   Tobacco Use   Smoking status: Never   Smokeless tobacco: Never  Vaping Use   Vaping Use: Never used  Substance Use Topics   Alcohol use: No   Drug use: No    ALLERGIES:  is allergic to sulfa antibiotics.  MEDICATIONS:  Current Outpatient Medications  Medication Sig Dispense Refill   abemaciclib (VERZENIO) 100 MG tablet TAKE 1 TABLET BY MOUTH TWICE DAILY. DO NOT CHEW, CRUSH, OR SPLIT TABLETS BEFORE SWALLOWING 56 tablet 6   anastrozole (ARIMIDEX) 1 MG tablet Take 1 tablet (1 mg total) by mouth daily. 30 tablet 4   aspirin EC 81 MG tablet Take 81 mg by mouth daily.     atorvastatin (LIPITOR) 20 MG tablet Take 1 tablet (20 mg total) by mouth daily. 90 tablet 3   Calcium Carb-Cholecalciferol 500-10 MG-MCG TABS Take by mouth.     Cholecalciferol 50 MCG (2000 UT) CAPS Take 3 capsules daily for 3 months, then reduce to 1 capsule daily thereafter for Vitamin D Deficiency.     clotrimazole-betamethasone (LOTRISONE) cream SMARTSIG:Topical Morning-Evening  cyanocobalamin (VITAMIN B12) 1000 MCG tablet 1,000 mcg daily. Take 2 tablets daily for 2 weeks, then reduce to 1 tablet daily thereafter for Vitamin B12 Deficiency.     fulvestrant (FASLODEX) 250 MG/5ML injection Inject into the muscle every 30 (thirty) days. One injection each buttock over 1-2 minutes. Warm prior to use.     gabapentin (NEURONTIN) 300 MG capsule Take 300 mg by mouth at bedtime.     glipiZIDE (GLUCOTROL XL) 10 MG 24 hr tablet Take 1 tablet (10 mg total) by mouth 2 (two) times daily. 180 tablet 3   insulin aspart (NOVOLOG FLEXPEN) 100 UNIT/ML FlexPen Inject 4 Units into the skin 3 (three) times daily with meals. 15 mL 0   Insulin Detemir (LEVEMIR) 100 UNIT/ML Pen Inject 20 Units into the skin at bedtime. 15 mL 0   levothyroxine (SYNTHROID) 25  MCG tablet Take 1 tablet (25 mcg total) by mouth daily before breakfast. 90 tablet 1   lisinopril (ZESTRIL) 10 MG tablet Take 10 mg by mouth daily.     loperamide (IMODIUM A-D) 2 MG tablet Take 2 mg by mouth 4 (four) times daily as needed for diarrhea or loose stools.     magnesium oxide (MAG-OX) 400 MG tablet Take 1 tablet by mouth daily.     Melatonin 10 MG TABS Take 1 tablet by mouth at bedtime as needed.     ondansetron (ZOFRAN-ODT) 4 MG disintegrating tablet Take 1 tablet (4 mg total) by mouth every 8 (eight) hours as needed for nausea or vomiting. 20 tablet 3   TRULICITY 3.01 SW/1.0XN SOPN SMARTSIG:0.5 Milliliter(s) SUB-Q Once a Week     azithromycin (ZITHROMAX) 250 MG tablet Take 2 on day 1; and then 1 pill once a day. (Patient not taking: Reported on 08/24/2022) 6 each 0   No current facility-administered medications for this visit.   Facility-Administered Medications Ordered in Other Visits  Medication Dose Route Frequency Provider Last Rate Last Admin   fulvestrant (FASLODEX) injection 500 mg  500 mg Intramuscular Once [name], MD        PHYSICAL EXAMINATION: ECOG PERFORMANCE STATUS: 1 - Symptomatic but completely ambulatory  BP 118/80 (BP Location: Left Arm, Patient Position: Sitting)   Pulse (!) 103   Temp (!) 96.4 F (35.8 C) (Tympanic)   Resp 16   Wt 168 lb 1.6 oz (76.2 kg)   BMI 30.26 kg/m   Filed Weights   09/21/22 0900  Weight: 168 lb 1.6 oz (76.2 kg)      Physical Exam Constitutional:      Comments: She is alone.   HENT:     Head: Normocephalic and atraumatic.     Mouth/Throat:     Pharynx: No oropharyngeal exudate.  Eyes:     Pupils: Pupils are equal, round, and reactive to light.  Cardiovascular:     Rate and Rhythm: Normal rate and regular rhythm.  Pulmonary:     Effort: Pulmonary effort is normal. No respiratory distress.     Breath sounds: Normal breath sounds. No wheezing.  Abdominal:     General: Bowel sounds are normal. There  is no distension.     Palpations: Abdomen is soft. There is no mass.     Tenderness: There is no abdominal tenderness. There is no guarding or rebound.  Musculoskeletal:        General: No tenderness. Normal range of motion.     Cervical back: Normal range of motion and neck supple.     Comments:  Approximately 1 cm hard nodule felt in the anterior chest wall/left parasternal.  Skin:    General: Skin is warm.  Neurological:     Mental Status: She is alert and oriented to person, place, and time.  Psychiatric:        Mood and Affect: Affect normal.    Maculopapular rash mostly on the left lower extremity-mild on left lower; bilateral upper extremities  LABORATORY DATA:  I have reviewed the data as listed    Component Value Date/Time   NA 134 (L) 09/21/2022 0922   NA 135 11/28/2014 1401   K 3.9 09/21/2022 0922   K 4.7 11/28/2014 1401   CL 98 09/21/2022 0922   CL 99 (L) 11/28/2014 1401   CO2 25 09/21/2022 0922   CO2 28 11/28/2014 1401   GLUCOSE 84 09/21/2022 0922   GLUCOSE 215 (H) 11/28/2014 1401   BUN 18 09/21/2022 0922   BUN 16 11/28/2014 1401   CREATININE 1.19 (H) 09/21/2022 0922   CREATININE 0.96 11/28/2014 1401   CALCIUM 10.8 (H) 09/21/2022 0922   CALCIUM 9.9 11/28/2014 1401   PROT 7.1 09/21/2022 0922   PROT 7.6 11/28/2014 1401   ALBUMIN 3.5 09/21/2022 0922   ALBUMIN 4.5 11/28/2014 1401   AST 40 09/21/2022 0922   AST 26 11/28/2014 1401   ALT 31 09/21/2022 0922   ALT 28 11/28/2014 1401   ALKPHOS 70 09/21/2022 0922   ALKPHOS 78 11/28/2014 1401   BILITOT 0.6 09/21/2022 0922   BILITOT 0.5 11/28/2014 1401   GFRNONAA 50 (L) 09/21/2022 0922   GFRNONAA >60 11/28/2014 1401   GFRAA >60 05/30/2020 1022   GFRAA >60 11/28/2014 1401    No results found for: "SPEP", "UPEP"  Lab Results  Component Value Date   WBC 4.0 09/21/2022   NEUTROABS 1.9 09/21/2022   HGB 11.0 (L) 09/21/2022   HCT 31.3 (L) 09/21/2022   MCV 99.4 09/21/2022   PLT 193 09/21/2022      Chemistry       Component Value Date/Time   NA 134 (L) 09/21/2022 0922   NA 135 11/28/2014 1401   K 3.9 09/21/2022 0922   K 4.7 11/28/2014 1401   CL 98 09/21/2022 0922   CL 99 (L) 11/28/2014 1401   CO2 25 09/21/2022 0922   CO2 28 11/28/2014 1401   BUN 18 09/21/2022 0922   BUN 16 11/28/2014 1401   CREATININE 1.19 (H) 09/21/2022 0922   CREATININE 0.96 11/28/2014 1401      Component Value Date/Time   CALCIUM 10.8 (H) 09/21/2022 0922   CALCIUM 9.9 11/28/2014 1401   ALKPHOS 70 09/21/2022 0922   ALKPHOS 78 11/28/2014 1401   AST 40 09/21/2022 0922   AST 26 11/28/2014 1401   ALT 31 09/21/2022 0922   ALT 28 11/28/2014 1401   BILITOT 0.6 09/21/2022 0922   BILITOT 0.5 11/28/2014 1401       RADIOGRAPHIC STUDIES: I have personally reviewed the radiological images as listed and agreed with the findings in the report. No results found.   ASSESSMENT & PLAN:  Carcinoma of lower-outer quadrant of right breast in female, estrogen receptor positive ([place])  # Recurrent breast cancer-ER positive PR negative ? HER-2/neu-currently on Faslodex plus abema. NOV 20th, 2023- CT CAP-  No definitive evidence to suggest recurrent or metastatic disease in the chest, abdomen or pelvis. Discontinued faslodex sec to insurance issues.    #  Currently on  Abema 100 BID; Labs today-WBC-4.2 ; Hb-10-11; platelets-N-  stable.Will plan Anastrzole  starting today.-New prescription sent.  Will get scans in [facility]; will order at next visit.   # Hyponatremia [131]/ elevated creatinine [1.2]-sec to poor hydration/diarrhea- ? abema- recommend increasing fluids/salt intake electrolytes. stable   # Hypomagnesia: 1.2- on OTC mag- stable.   # DM- BG- 22 s/p endocrinology evaluation; with diet and medications [trucilicity; Novolog]- stable.   # recurrent UTI/ ? Suprapubic tenderness- S/p eval with urology.  Monitor closely on Abema/with neutropenia- stable.   # Hypercalcemia: Ca 10.8-resolved- continue just vit D- monitor for now.  Check Vit D levels  # Low B 12 [PCP]- on B12 PO.   # IV access; PIV.   #DISPOSITION: **early AM appt  # Follow up in 4 weeksMD/ labs [CBC/CMP/Ca 27-29; check vit D 25-OH]  Dr.B       No orders of the defined types were placed in this encounter.     All questions were answered. The patient knows to call the clinic with any problems, questions or concerns.      [name], MD 09/21/2022 10:23 AM

## 2022-09-21 NOTE — Progress Notes (Signed)
Having trouble with her feet that included pain, numbness and tingling.  PCP started her on Gabapentin 5 days ago.  Magnesium level 1.2 from [facility] lab draw.  Has started OTC magnesium.  Vitamin B12 level was 80 on [facility] lab draw.  Has started Vitamin B12 1000 mg 2 tab QD for 2 weeks then decrease to 1 tab QD as directed by PCP

## 2022-09-22 DIAGNOSIS — M25561 Pain in right knee: Secondary | ICD-10-CM | POA: Diagnosis not present

## 2022-09-22 DIAGNOSIS — M2341 Loose body in knee, right knee: Secondary | ICD-10-CM | POA: Diagnosis not present

## 2022-09-22 DIAGNOSIS — T451X5A Adverse effect of antineoplastic and immunosuppressive drugs, initial encounter: Secondary | ICD-10-CM | POA: Diagnosis not present

## 2022-09-22 DIAGNOSIS — G8929 Other chronic pain: Secondary | ICD-10-CM | POA: Diagnosis not present

## 2022-09-22 DIAGNOSIS — G620 Drug-induced polyneuropathy: Secondary | ICD-10-CM | POA: Diagnosis not present

## 2022-09-22 LAB — CANCER ANTIGEN 27.29: CA 27.29: 19.4 U/mL (ref 0.0–38.6)

## 2022-10-19 ENCOUNTER — Inpatient Hospital Stay: Payer: PPO | Attending: Internal Medicine

## 2022-10-19 ENCOUNTER — Encounter: Payer: Self-pay | Admitting: Internal Medicine

## 2022-10-19 ENCOUNTER — Inpatient Hospital Stay (HOSPITAL_BASED_OUTPATIENT_CLINIC_OR_DEPARTMENT_OTHER): Payer: PPO | Admitting: Internal Medicine

## 2022-10-19 VITALS — BP 110/75 | HR 97 | Temp 96.9°F | Resp 16 | Wt 165.0 lb

## 2022-10-19 DIAGNOSIS — E871 Hypo-osmolality and hyponatremia: Secondary | ICD-10-CM | POA: Insufficient documentation

## 2022-10-19 DIAGNOSIS — C50511 Malignant neoplasm of lower-outer quadrant of right female breast: Secondary | ICD-10-CM | POA: Diagnosis not present

## 2022-10-19 DIAGNOSIS — Z7901 Long term (current) use of anticoagulants: Secondary | ICD-10-CM | POA: Diagnosis not present

## 2022-10-19 DIAGNOSIS — N183 Chronic kidney disease, stage 3 unspecified: Secondary | ICD-10-CM | POA: Diagnosis not present

## 2022-10-19 DIAGNOSIS — Z86718 Personal history of other venous thrombosis and embolism: Secondary | ICD-10-CM | POA: Insufficient documentation

## 2022-10-19 DIAGNOSIS — E1122 Type 2 diabetes mellitus with diabetic chronic kidney disease: Secondary | ICD-10-CM | POA: Insufficient documentation

## 2022-10-19 DIAGNOSIS — Z9013 Acquired absence of bilateral breasts and nipples: Secondary | ICD-10-CM | POA: Diagnosis not present

## 2022-10-19 DIAGNOSIS — Z79811 Long term (current) use of aromatase inhibitors: Secondary | ICD-10-CM | POA: Insufficient documentation

## 2022-10-19 DIAGNOSIS — I129 Hypertensive chronic kidney disease with stage 1 through stage 4 chronic kidney disease, or unspecified chronic kidney disease: Secondary | ICD-10-CM | POA: Insufficient documentation

## 2022-10-19 DIAGNOSIS — C7800 Secondary malignant neoplasm of unspecified lung: Secondary | ICD-10-CM | POA: Insufficient documentation

## 2022-10-19 DIAGNOSIS — Z7985 Long-term (current) use of injectable non-insulin antidiabetic drugs: Secondary | ICD-10-CM | POA: Diagnosis not present

## 2022-10-19 DIAGNOSIS — Z794 Long term (current) use of insulin: Secondary | ICD-10-CM | POA: Diagnosis not present

## 2022-10-19 DIAGNOSIS — Z79899 Other long term (current) drug therapy: Secondary | ICD-10-CM | POA: Insufficient documentation

## 2022-10-19 DIAGNOSIS — Z170 Estrogen receptor positive status [ER+]: Secondary | ICD-10-CM | POA: Diagnosis not present

## 2022-10-19 DIAGNOSIS — Z7984 Long term (current) use of oral hypoglycemic drugs: Secondary | ICD-10-CM | POA: Diagnosis not present

## 2022-10-19 DIAGNOSIS — Z7982 Long term (current) use of aspirin: Secondary | ICD-10-CM | POA: Insufficient documentation

## 2022-10-19 DIAGNOSIS — R067 Sneezing: Secondary | ICD-10-CM | POA: Insufficient documentation

## 2022-10-19 LAB — CBC WITH DIFFERENTIAL/PLATELET
Abs Immature Granulocytes: 0.01 10*3/uL (ref 0.00–0.07)
Basophils Absolute: 0.1 10*3/uL (ref 0.0–0.1)
Basophils Relative: 3 %
Eosinophils Absolute: 0 10*3/uL (ref 0.0–0.5)
Eosinophils Relative: 1 %
HCT: 29.9 % — ABNORMAL LOW (ref 36.0–46.0)
Hemoglobin: 10.1 g/dL — ABNORMAL LOW (ref 12.0–15.0)
Immature Granulocytes: 0 %
Lymphocytes Relative: 55 %
Lymphs Abs: 1.3 10*3/uL (ref 0.7–4.0)
MCH: 34.4 pg — ABNORMAL HIGH (ref 26.0–34.0)
MCHC: 33.8 g/dL (ref 30.0–36.0)
MCV: 101.7 fL — ABNORMAL HIGH (ref 80.0–100.0)
Monocytes Absolute: 0.2 10*3/uL (ref 0.1–1.0)
Monocytes Relative: 7 %
Neutro Abs: 0.8 10*3/uL — ABNORMAL LOW (ref 1.7–7.7)
Neutrophils Relative %: 34 %
Platelets: 191 10*3/uL (ref 150–400)
RBC: 2.94 MIL/uL — ABNORMAL LOW (ref 3.87–5.11)
RDW: 13.4 % (ref 11.5–15.5)
WBC: 2.5 10*3/uL — ABNORMAL LOW (ref 4.0–10.5)
nRBC: 0 % (ref 0.0–0.2)

## 2022-10-19 LAB — COMPREHENSIVE METABOLIC PANEL
ALT: 31 U/L (ref 0–44)
AST: 43 U/L — ABNORMAL HIGH (ref 15–41)
Albumin: 3.5 g/dL (ref 3.5–5.0)
Alkaline Phosphatase: 76 U/L (ref 38–126)
Anion gap: 9 (ref 5–15)
BUN: 23 mg/dL (ref 8–23)
CO2: 23 mmol/L (ref 22–32)
Calcium: 10.4 mg/dL — ABNORMAL HIGH (ref 8.9–10.3)
Chloride: 102 mmol/L (ref 98–111)
Creatinine, Ser: 1.23 mg/dL — ABNORMAL HIGH (ref 0.44–1.00)
GFR, Estimated: 48 mL/min — ABNORMAL LOW (ref >60)
Glucose, Bld: 132 mg/dL — ABNORMAL HIGH (ref 70–99)
Potassium: 3.9 mmol/L (ref 3.5–5.1)
Sodium: 134 mmol/L — ABNORMAL LOW (ref 135–145)
Total Bilirubin: 0.7 mg/dL (ref 0.3–1.2)
Total Protein: 7.1 g/dL (ref 6.5–8.1)

## 2022-10-19 LAB — MAGNESIUM: Magnesium: 1.6 mg/dL — ABNORMAL LOW (ref 1.7–2.4)

## 2022-10-19 LAB — VITAMIN D 25 HYDROXY (VIT D DEFICIENCY, FRACTURES): Vit D, 25-Hydroxy: 90.24 ng/mL (ref 30–100)

## 2022-10-19 MED ORDER — OLOPATADINE HCL 0.1 % OP SOLN: 1.0000 [drp] | Freq: Two times a day (BID) | OPHTHALMIC | 3 refills | Status: DC

## 2022-10-19 NOTE — Progress Notes (Signed)
Patient has issues with allergies with symptoms of itchy watery eyes and sneezing. No improvement from Montelukast.

## 2022-10-19 NOTE — Progress Notes (Signed)
.[facility] OFFICE PROGRESS NOTE  Patient Care Team: Ezequiel Kayser, MD as PCP - General (Internal Medicine) Neldon Mc, MD (Inactive) (General Surgery) Everlene Farrier, MD (Obstetrics and Gynecology) Noreene Filbert, MD Forest Gleason, MD (Unknown Physician Specialty) [name], MD as Consulting Physician (Hematology and Oncology)   Cancer Staging  Carcinoma of lower-outer quadrant of right breast in female, estrogen receptor positive [facility]) Staging form: Breast, AJCC 7th Edition - Pathologic: ypT1c,ypN2a, MX - Signed by Haywood Lasso, MD on 03/10/2012 Cancer stage: ypT1c,ypN2a, MX    Oncology History Overview Note  # DEC 2012- RIGHT BREAST CA T2 N2 M0 tumor from biopsy.  Estrogen receptor positive, Progesterone receptor positive.  Current receptor negative by FISH 2. Neoadjuvant chemotherapy started in December of 28 with Cytoxan Adriamycin 3. Started on Taxol weekly chemotherapy. 4. Patient finished 12  cycles of Taxol chemotherapy in May of 2013.     5. Status post right modified radical mastectomy [Dr.Bowers; [place]] June of 2013, ypT1c  yp N2  MO. started also on Lerazole    7. Radiation therapy to the right breast (September of 2013).  Lymph node was positive for HER-2 receptor gene amplification of 2.22.  Will proceed with Herceptin treatment starting in September of 2013.   8.Patient has finished Herceptin (maintenance therapy) in August of 2014 8. Start patient on letrozole from November, 2013. 9. Patient started on Herceptin in September 2013.    10Patient finished 12 months of Herceptin therapy on August, 2014  # 6. Status post left side prophylactic mastectomy.  #Late MAY 2018-RECURRENCE BREAST CA- ER positive/PR negative; ?? HER-2/neu- [biopsy- proven-mediastinal lymph node; in suff for her 2 testing].  [elevated Tumor marker- CT/PET- uptake in Right Mediastinal LN; Sternum [June 2018 EBUS- Dr.Kasa]   # March 10 2017- faslodex +  Abema; OCT 5th CT-PR of mediastinal LN [consent]; Discontinued faslodex sec to insurance issues [last [place] 2023]. JAN 2024- continue Abema + Anastrzole   #August 2020-left chest wall nodule biopsy benign  #August 2019- DVT-right upper extremity; Eliquis; stop end of March 2021 [repeat ultrasound negative/patient preference]; # JAN 2022-Cirrhosis- ? On CT scan-FEB 2022MRI liver- Negative fro overt cirrhosis --------------------------------------------------------------- -    DIAGNOSIS: [ BREAST CANCER- ER/PR/HER2 NEU POS  STAGE:  4   ;GOALS: PALLIATIVE     Carcinoma of lower-outer quadrant of right breast in female, estrogen receptor positive ([place])   INTERVAL HISTORY: Alone.  Ambulating independently.  Kimberly [name] 67 y.o.  female pleasant patient above history of metastatic ER PR positive HER-2/neu positive breast cancer currently on abema+ Faslodex is here for follow-up.  Patient has issues with allergies with symptoms of itchy watery eyes and sneezing. No improvement from Montelukast. .  Continues to be on Vitamin B12 1000 mg  1 tab QD. Denies any infections.  Denies any nausea vomiting no fever no chills.  Review of Systems  Constitutional:  Positive for malaise/fatigue. Negative for chills, diaphoresis, fever and weight loss.  HENT:  Negative for nosebleeds and sore throat.   Eyes:  Negative for double vision.  Respiratory:  Negative for cough, hemoptysis, sputum production, shortness of breath and wheezing.   Cardiovascular:  Negative for chest pain, palpitations, orthopnea and leg swelling.  Gastrointestinal:  Negative for abdominal pain, blood in stool, constipation, heartburn, melena, nausea and vomiting.  Musculoskeletal:  Positive for back pain and joint pain.  Neurological:  Negative for dizziness, tingling, focal weakness, weakness and headaches.  Endo/Heme/Allergies:  Does not bruise/bleed easily.  Psychiatric/Behavioral:  Negative for depression. The patient is not  nervous/anxious and does not have insomnia.       PAST MEDICAL HISTORY :  Past Medical History:  Diagnosis Date   Arthritis    Cancer of lower-outer quadrant of female breast ([place]) 08/06/2011   RIGHT, chemo and bilateral mastectomies.   High cholesterol    History of kidney stones    Hx of bilateral breast implants    Hypertension    Lung metastases [id number]   chemo pills and faslidex shots.   PONV (postoperative nausea and vomiting)    Type II diabetes mellitus ([place])    fasting 140-150   Vertigo    LAST WEEK   Vertigo 01/2017    PAST SURGICAL HISTORY :   Past Surgical History:  Procedure Laterality Date   BREAST BIOPSY  07/2011, 2020   right   BREAST RECONSTRUCTION  03/07/2012   Procedure: BREAST RECONSTRUCTION;  Surgeon: Crissie Reese, MD;  Location: [facility];  Service: Plastics;  Laterality: Left;  BREAST RECONSTRUCTION WITH PLACEMENT OF TISSUE EXPANDER TO LEFT BREAST   BREAST RECONSTRUCTION  02/06/2013   CESAREAN SECTION  [id number]   ENDOBRONCHIAL ULTRASOUND N/A 02/24/2017   Procedure: ENDOBRONCHIAL ULTRASOUND;  Surgeon: [name], MD;  Location: [facility];  Service: Cardiopulmonary;  Laterality: N/A;   LATISSIMUS FLAP TO BREAST Right 02/06/2013   Procedure: LATISSIMUS FLAP TO RIGHT BREAST WITH IMPLANT;  Surgeon: Crissie Reese, MD;  Location: [facility];  Service: Plastics;  Laterality: Right;   MASTECTOMY  03/07/12   modified right; total left   MODIFIED MASTECTOMY  03/07/2012   Procedure: MODIFIED MASTECTOMY;  Surgeon: Haywood Lasso, MD;  Location: [facility];  Service: General;  Laterality: Right;   PORTACATH PLACEMENT  07/2011   SCAR REVISION  03/30/2012   Procedure: SCAR REVISION;  Surgeon: Haywood Lasso, MD;  Location: [facility];  Service: General;  Laterality: Right;  CLOSURE OF MASTECTOMY INCISION   WISDOM TOOTH EXTRACTION      FAMILY HISTORY :   Family History  Problem Relation Age of Onset   Breast cancer Mother    Diabetes Father     SOCIAL HISTORY:    Social History   Tobacco Use   Smoking status: Never   Smokeless tobacco: Never  Vaping Use   Vaping Use: Never used  Substance Use Topics   Alcohol use: No   Drug use: No    ALLERGIES:  is allergic to sulfa antibiotics.  MEDICATIONS:  Current Outpatient Medications  Medication Sig Dispense Refill   abemaciclib (VERZENIO) 100 MG tablet TAKE 1 TABLET BY MOUTH TWICE DAILY. DO NOT CHEW, CRUSH, OR SPLIT TABLETS BEFORE SWALLOWING 56 tablet 6   anastrozole (ARIMIDEX) 1 MG tablet Take 1 tablet (1 mg total) by mouth daily. 30 tablet 4   aspirin EC 81 MG tablet Take 81 mg by mouth daily.     atorvastatin (LIPITOR) 20 MG tablet Take 1 tablet (20 mg total) by mouth daily. 90 tablet 3   Calcium Carb-Cholecalciferol 500-10 MG-MCG TABS Take by mouth.     Cholecalciferol 50 MCG (2000 UT) CAPS Take 3 capsules daily for 3 months, then reduce to 1 capsule daily thereafter for Vitamin D Deficiency.     clotrimazole-betamethasone (LOTRISONE) cream SMARTSIG:Topical Morning-Evening     cyanocobalamin (VITAMIN B12) 1000 MCG tablet 1,000 mcg daily. Take 2 tablets daily for 2 weeks, then reduce to 1 tablet daily thereafter for Vitamin B12 Deficiency.     gabapentin (NEURONTIN) 300 MG  capsule Take 300 mg by mouth at bedtime.     glipiZIDE (GLUCOTROL XL) 10 MG 24 hr tablet Take 1 tablet (10 mg total) by mouth 2 (two) times daily. 180 tablet 3   insulin aspart (NOVOLOG FLEXPEN) 100 UNIT/ML FlexPen Inject 4 Units into the skin 3 (three) times daily with meals. 15 mL 0   Insulin Detemir (LEVEMIR) 100 UNIT/ML Pen Inject 20 Units into the skin at bedtime. 15 mL 0   levothyroxine (SYNTHROID) 25 MCG tablet Take 1 tablet (25 mcg total) by mouth daily before breakfast. 90 tablet 1   lisinopril (ZESTRIL) 10 MG tablet Take 10 mg by mouth daily.     loperamide (IMODIUM A-D) 2 MG tablet Take 2 mg by mouth 4 (four) times daily as needed for diarrhea or loose stools.     magnesium oxide (MAG-OX) 400 MG tablet Take 1 tablet  by mouth daily.     Melatonin 10 MG TABS Take 1 tablet by mouth at bedtime as needed.     olopatadine (PATANOL) 0.1 % ophthalmic solution Place 1 drop into both eyes 2 (two) times daily. 5 mL 3   ondansetron (ZOFRAN-ODT) 4 MG disintegrating tablet Take 1 tablet (4 mg total) by mouth every 8 (eight) hours as needed for nausea or vomiting. 20 tablet 3   TRULICITY [id number] [id number] SOPN SMARTSIG:0.5 Milliliter(s) SUB-Q Once a Week     azithromycin (ZITHROMAX) 250 MG tablet Take 2 on day 1; and then 1 pill once a day. (Patient not taking: Reported on 08/24/2022) 6 each 0   No current facility-administered medications for this visit.   Facility-Administered Medications Ordered in Other Visits  Medication Dose Route Frequency Provider Last Rate Last Admin   fulvestrant (FASLODEX) injection 500 mg  500 mg Intramuscular Once [name], MD        PHYSICAL EXAMINATION: ECOG PERFORMANCE STATUS: 1 - Symptomatic but completely ambulatory  BP 110/75 (BP Location: Left Arm, Patient Position: Sitting)   Pulse 97   Temp (!) 96.9 F (36.1 C) (Tympanic)   Resp 16   Wt 165 lb (74.8 kg)   BMI 29.70 kg/m   Filed Weights   10/19/22 0900  Weight: 165 lb (74.8 kg)      Physical Exam Constitutional:      Comments: She is alone.   HENT:     Head: Normocephalic and atraumatic.     Mouth/Throat:     Pharynx: No oropharyngeal exudate.  Eyes:     Pupils: Pupils are equal, round, and reactive to light.  Cardiovascular:     Rate and Rhythm: Normal rate and regular rhythm.  Pulmonary:     Effort: Pulmonary effort is normal. No respiratory distress.     Breath sounds: Normal breath sounds. No wheezing.  Abdominal:     General: Bowel sounds are normal. There is no distension.     Palpations: Abdomen is soft. There is no mass.     Tenderness: There is no abdominal tenderness. There is no guarding or rebound.  Musculoskeletal:        General: No tenderness. Normal range of motion.     Cervical  back: Normal range of motion and neck supple.     Comments: Approximately 1 cm hard nodule felt in the anterior chest wall/left parasternal.  Skin:    General: Skin is warm.  Neurological:     Mental Status: She is alert and oriented to person, place, and time.  Psychiatric:  Mood and Affect: Affect normal.    Maculopapular rash mostly on the left lower extremity-mild on left lower; bilateral upper extremities  LABORATORY DATA:  I have reviewed the data as listed    Component Value Date/Time   NA 134 (L) 10/19/2022 0854   NA 135 11/28/2014 1401   K 3.9 10/19/2022 0854   K 4.7 11/28/2014 1401   CL 102 10/19/2022 0854   CL 99 (L) 11/28/2014 1401   CO2 23 10/19/2022 0854   CO2 28 11/28/2014 1401   GLUCOSE 132 (H) 10/19/2022 0854   GLUCOSE 215 (H) 11/28/2014 1401   BUN 23 10/19/2022 0854   BUN 16 11/28/2014 1401   CREATININE 1.23 (H) 10/19/2022 0854   CREATININE 0.96 11/28/2014 1401   CALCIUM 10.4 (H) 10/19/2022 0854   CALCIUM 9.9 11/28/2014 1401   PROT 7.1 10/19/2022 0854   PROT 7.6 11/28/2014 1401   ALBUMIN 3.5 10/19/2022 0854   ALBUMIN 4.5 11/28/2014 1401   AST 43 (H) 10/19/2022 0854   AST 26 11/28/2014 1401   ALT 31 10/19/2022 0854   ALT 28 11/28/2014 1401   ALKPHOS 76 10/19/2022 0854   ALKPHOS 78 11/28/2014 1401   BILITOT 0.7 10/19/2022 0854   BILITOT 0.5 11/28/2014 1401   GFRNONAA 48 (L) 10/19/2022 0854   GFRNONAA >60 11/28/2014 1401   GFRAA >60 05/30/2020 1022   GFRAA >60 11/28/2014 1401    No results found for: "SPEP", "UPEP"  Lab Results  Component Value Date   WBC 2.5 (L) 10/19/2022   NEUTROABS 0.8 (L) 10/19/2022   HGB 10.1 (L) 10/19/2022   HCT 29.9 (L) 10/19/2022   MCV 101.7 (H) 10/19/2022   PLT 191 10/19/2022      Chemistry      Component Value Date/Time   NA 134 (L) 10/19/2022 0854   NA 135 11/28/2014 1401   K 3.9 10/19/2022 0854   K 4.7 11/28/2014 1401   CL 102 10/19/2022 0854   CL 99 (L) 11/28/2014 1401   CO2 23 10/19/2022 0854    CO2 28 11/28/2014 1401   BUN 23 10/19/2022 0854   BUN 16 11/28/2014 1401   CREATININE 1.23 (H) 10/19/2022 0854   CREATININE 0.96 11/28/2014 1401      Component Value Date/Time   CALCIUM 10.4 (H) 10/19/2022 0854   CALCIUM 9.9 11/28/2014 1401   ALKPHOS 76 10/19/2022 0854   ALKPHOS 78 11/28/2014 1401   AST 43 (H) 10/19/2022 0854   AST 26 11/28/2014 1401   ALT 31 10/19/2022 0854   ALT 28 11/28/2014 1401   BILITOT 0.7 10/19/2022 0854   BILITOT 0.5 11/28/2014 1401       RADIOGRAPHIC STUDIES: I have personally reviewed the radiological images as listed and agreed with the findings in the report. No results found.   ASSESSMENT & PLAN:  Carcinoma of lower-outer quadrant of right breast in female, estrogen receptor positive ([place]berly [name])  # Recurrent breast cancer-ER positive PR negative ? HER-2/neu-currently on Faslodex plus abema. NOV 20th, 2023- CT CAP-  No definitive evidence to suggest recurrent or metastatic disease in the chest, abdomen or pelvis. Discontinued faslodex sec to insurance issues. STABLE.    #  Currently on  Abema 100 BID; Labs today-ANC- 0.8 ; Hb-10-11; platelets-N-  stable. continue Anastrazole [JAN Mid [id number]; recommend break on week ends given ANC < 0.1.   Will get scans in [facility]; will order today.   # Bilateral itchy watery eyes and sneezing. No improvement from Montelukast; recommend Patanol prn. Script sent.    #  Hyponatremia [131]/CKD stage III- sec to poor hydration/diarrhea- ? abema- recommend increasing fluids/salt intake electrolytes. STABLE.   # Hypomagnesia: 1.6- on OTC mag- STABLE.   # DM- BG- 140s/p endocrinology evaluation; with diet and medications [trucilicity; Novolog]- STABLE.   # recurrent UTI/ ? Suprapubic tenderness- S/p eval with urology.  Monitor closely on Abema/with neutropenia- STABLE.   # Hypercalcemia: Ca 10.4-resolved- continue just vit D- monitor for now. Awaiting Vit D levels  # Low B 12 [PCP]- on B12 PO.   # IV access; PIV.    #DISPOSITION: **early AM appt  # Follow up in 4 weeksMD/ labs [CBC/CMP/Ca 27-29;] prior-CT CAP Dr.B       Orders Placed This Encounter  Procedures   CT CHEST ABDOMEN PELVIS W CONTRAST    Standing Status:   Future    Standing Expiration Date:   10/20/2023    Order Specific Question:   Preferred imaging location?    Answer:   [facility]    Order Specific Question:   Radiology Contrast Protocol - do NOT remove file path    Answer:   [email or web address]      All questions were answered. The patient knows to call the clinic with any problems, questions or concerns.      [name], MD 10/19/2022 10:05 AM

## 2022-10-19 NOTE — Assessment & Plan Note (Signed)
#   Recurrent breast cancer-ER positive PR negative ? HER-2/neu-currently on Faslodex plus abema. NOV 20th, 2023- CT CAP-  No definitive evidence to suggest recurrent or metastatic disease in the chest, abdomen or pelvis. Discontinued faslodex sec to insurance issues. STABLE.    #  Currently on  Abema 100 BID; Labs today-ANC- 0.8 ; Hb-10-11; platelets-N-  stable. continue Anastrazole [JAN Mid [id number]; recommend break on week ends given ANC < 0.1.   Will get scans in [facility]; will order today.   # Bilateral itchy watery eyes and sneezing. No improvement from Montelukast; recommend Patanol prn. Script sent.    # Hyponatremia [131]/CKD stage III- sec to poor hydration/diarrhea- ? abema- recommend increasing fluids/salt intake electrolytes. STABLE.   # Hypomagnesia: 1.6- on OTC mag- STABLE.   # DM- BG- 140s/p endocrinology evaluation; with diet and medications [trucilicity; Novolog]- STABLE.   # recurrent UTI/ ? Suprapubic tenderness- S/p eval with urology.  Monitor closely on Abema/with neutropenia- STABLE.   # Hypercalcemia: Ca 10.4-resolved- continue just vit D- monitor for now. Awaiting Vit D levels  # Low B 12 [PCP]- on B12 PO.   # IV access; PIV.   #DISPOSITION: **early AM appt  # Follow up in 4 weeksMD/ labs [CBC/CMP/Ca 27-29;] prior-CT CAP Dr.B

## 2022-10-20 LAB — CANCER ANTIGEN 27.29: CA 27.29: 25.4 U/mL (ref 0.0–38.6)

## 2022-10-21 ENCOUNTER — Encounter: Payer: Self-pay | Admitting: Internal Medicine

## 2022-10-22 ENCOUNTER — Other Ambulatory Visit: Payer: Self-pay | Admitting: Internal Medicine

## 2022-10-22 MED ORDER — OLOPATADINE HCL 0.1 % OP SOLN: 1.0000 [drp] | Freq: Two times a day (BID) | OPHTHALMIC | 3 refills | Status: DC

## 2022-11-12 ENCOUNTER — Ambulatory Visit
Admission: RE | Admit: 2022-11-12 | Discharge: 2022-11-12 | Disposition: A | Discharge status: Home / Self Care | Payer: PPO | Source: Ambulatory Visit | Attending: Internal Medicine | Admitting: Internal Medicine

## 2022-11-12 DIAGNOSIS — Z170 Estrogen receptor positive status [ER+]: Secondary | ICD-10-CM | POA: Insufficient documentation

## 2022-11-12 DIAGNOSIS — C50511 Malignant neoplasm of lower-outer quadrant of right female breast: Secondary | ICD-10-CM | POA: Insufficient documentation

## 2022-11-12 DIAGNOSIS — K8020 Calculus of gallbladder without cholecystitis without obstruction: Secondary | ICD-10-CM | POA: Diagnosis not present

## 2022-11-12 DIAGNOSIS — N281 Cyst of kidney, acquired: Secondary | ICD-10-CM | POA: Diagnosis not present

## 2022-11-12 DIAGNOSIS — R918 Other nonspecific abnormal finding of lung field: Secondary | ICD-10-CM | POA: Diagnosis not present

## 2022-11-12 DIAGNOSIS — J929 Pleural plaque without asbestos: Secondary | ICD-10-CM | POA: Diagnosis not present

## 2022-11-12 DIAGNOSIS — C50919 Malignant neoplasm of unspecified site of unspecified female breast: Secondary | ICD-10-CM | POA: Diagnosis not present

## 2022-11-12 MED ORDER — IOHEXOL 300 MG/ML  SOLN
100.0000 mL | Freq: Once | INTRAMUSCULAR | Status: AC | PRN
  Administered 2022-11-12: 100 mL via INTRAVENOUS

## 2022-11-13 ENCOUNTER — Encounter: Payer: Self-pay | Admitting: Internal Medicine

## 2022-11-17 ENCOUNTER — Inpatient Hospital Stay: Payer: PPO | Admitting: Internal Medicine

## 2022-11-17 ENCOUNTER — Encounter: Payer: Self-pay | Admitting: Internal Medicine

## 2022-11-17 ENCOUNTER — Inpatient Hospital Stay: Payer: PPO | Attending: Internal Medicine

## 2022-11-17 VITALS — BP 111/63 | HR 100 | Temp 97.9°F | Resp 18 | Wt 163.4 lb

## 2022-11-17 DIAGNOSIS — Z87442 Personal history of urinary calculi: Secondary | ICD-10-CM | POA: Diagnosis not present

## 2022-11-17 DIAGNOSIS — Z79811 Long term (current) use of aromatase inhibitors: Secondary | ICD-10-CM | POA: Insufficient documentation

## 2022-11-17 DIAGNOSIS — R067 Sneezing: Secondary | ICD-10-CM | POA: Diagnosis not present

## 2022-11-17 DIAGNOSIS — C50511 Malignant neoplasm of lower-outer quadrant of right female breast: Secondary | ICD-10-CM | POA: Diagnosis not present

## 2022-11-17 DIAGNOSIS — Z794 Long term (current) use of insulin: Secondary | ICD-10-CM | POA: Diagnosis not present

## 2022-11-17 DIAGNOSIS — Z79899 Other long term (current) drug therapy: Secondary | ICD-10-CM | POA: Diagnosis not present

## 2022-11-17 DIAGNOSIS — Z7982 Long term (current) use of aspirin: Secondary | ICD-10-CM | POA: Insufficient documentation

## 2022-11-17 DIAGNOSIS — I129 Hypertensive chronic kidney disease with stage 1 through stage 4 chronic kidney disease, or unspecified chronic kidney disease: Secondary | ICD-10-CM | POA: Diagnosis not present

## 2022-11-17 DIAGNOSIS — Z86718 Personal history of other venous thrombosis and embolism: Secondary | ICD-10-CM | POA: Insufficient documentation

## 2022-11-17 DIAGNOSIS — Z9013 Acquired absence of bilateral breasts and nipples: Secondary | ICD-10-CM | POA: Insufficient documentation

## 2022-11-17 DIAGNOSIS — C7800 Secondary malignant neoplasm of unspecified lung: Secondary | ICD-10-CM | POA: Insufficient documentation

## 2022-11-17 DIAGNOSIS — N3090 Cystitis, unspecified without hematuria: Secondary | ICD-10-CM | POA: Insufficient documentation

## 2022-11-17 DIAGNOSIS — E871 Hypo-osmolality and hyponatremia: Secondary | ICD-10-CM | POA: Insufficient documentation

## 2022-11-17 DIAGNOSIS — Z7984 Long term (current) use of oral hypoglycemic drugs: Secondary | ICD-10-CM | POA: Diagnosis not present

## 2022-11-17 DIAGNOSIS — Z87440 Personal history of urinary (tract) infections: Secondary | ICD-10-CM | POA: Insufficient documentation

## 2022-11-17 DIAGNOSIS — E1122 Type 2 diabetes mellitus with diabetic chronic kidney disease: Secondary | ICD-10-CM | POA: Diagnosis not present

## 2022-11-17 DIAGNOSIS — N183 Chronic kidney disease, stage 3 unspecified: Secondary | ICD-10-CM | POA: Insufficient documentation

## 2022-11-17 DIAGNOSIS — Z7901 Long term (current) use of anticoagulants: Secondary | ICD-10-CM | POA: Diagnosis not present

## 2022-11-17 DIAGNOSIS — Z170 Estrogen receptor positive status [ER+]: Secondary | ICD-10-CM | POA: Insufficient documentation

## 2022-11-17 LAB — CBC WITH DIFFERENTIAL (CANCER CENTER ONLY)
Abs Immature Granulocytes: 0.01 10*3/uL (ref 0.00–0.07)
Basophils Absolute: 0.1 10*3/uL (ref 0.0–0.1)
Basophils Relative: 2 %
Eosinophils Absolute: 0.1 10*3/uL (ref 0.0–0.5)
Eosinophils Relative: 2 %
HCT: 30 % — ABNORMAL LOW (ref 36.0–46.0)
Hemoglobin: 10.2 g/dL — ABNORMAL LOW (ref 12.0–15.0)
Immature Granulocytes: 0 %
Lymphocytes Relative: 44 %
Lymphs Abs: 1.1 10*3/uL (ref 0.7–4.0)
MCH: 34 pg (ref 26.0–34.0)
MCHC: 34 g/dL (ref 30.0–36.0)
MCV: 100 fL (ref 80.0–100.0)
Monocytes Absolute: 0.1 10*3/uL (ref 0.1–1.0)
Monocytes Relative: 4 %
Neutro Abs: 1.2 10*3/uL — ABNORMAL LOW (ref 1.7–7.7)
Neutrophils Relative %: 48 %
Platelet Count: 213 10*3/uL (ref 150–400)
RBC: 3 MIL/uL — ABNORMAL LOW (ref 3.87–5.11)
RDW: 13.5 % (ref 11.5–15.5)
WBC Count: 2.6 10*3/uL — ABNORMAL LOW (ref 4.0–10.5)
nRBC: 0 % (ref 0.0–0.2)

## 2022-11-17 LAB — CMP (CANCER CENTER ONLY)
ALT: 31 U/L (ref 0–44)
AST: 45 U/L — ABNORMAL HIGH (ref 15–41)
Albumin: 3.5 g/dL (ref 3.5–5.0)
Alkaline Phosphatase: 103 U/L (ref 38–126)
Anion gap: 9 (ref 5–15)
BUN: 15 mg/dL (ref 8–23)
CO2: 24 mmol/L (ref 22–32)
Calcium: 10 mg/dL (ref 8.9–10.3)
Chloride: 100 mmol/L (ref 98–111)
Creatinine: 1.26 mg/dL — ABNORMAL HIGH (ref 0.44–1.00)
GFR, Estimated: 47 mL/min — ABNORMAL LOW (ref >60)
Glucose, Bld: 190 mg/dL — ABNORMAL HIGH (ref 70–99)
Potassium: 3.6 mmol/L (ref 3.5–5.1)
Sodium: 133 mmol/L — ABNORMAL LOW (ref 135–145)
Total Bilirubin: 0.7 mg/dL (ref 0.3–1.2)
Total Protein: 7.1 g/dL (ref 6.5–8.1)

## 2022-11-17 MED ORDER — ONDANSETRON 4 MG PO TBDP: 4.0000 mg | ORAL_TABLET | Freq: Three times a day (TID) | ORAL | 3 refills | Status: AC | PRN

## 2022-11-17 NOTE — Assessment & Plan Note (Addendum)
#   Recurrent breast cancer-ER positive PR negative ? HER-2/neu-currently on abema+ Anastrozole. JAN 2024-Discontinued faslodex sec to insurance issues.]  MARCH 15th, 2023- CT CAP-  No definitive evidence to suggest recurrent or metastatic disease in the chest, abdomen or pelvis. Stable.  #  Currently on  Abema 100 BID; Labs today-ANC- 1.2  Hb-10-11; platelets-N-  stable. continue Anastrazole [JAN Mid [id number]; recommend break on week ends given ANC < 0.1. stable.      # Bilateral itchy watery eyes and sneezing. No improvement from Montelukast- on Patanol prn.  stable.   # Hyponatremia [131]/CKD stage III- sec to poor hydration/diarrhea- ? abema- recommend increasing fluids/salt intake electrolytes. stable.   # Hypomagnesia: 1.6- on OTC mag-stable.   # DM- BG- 140s/p endocrinology evaluation; with diet and medications [trucilicity; Novolog]- stable.   # recurrent UTI/ ? Suprapubic tenderness- S/p eval with urology.  Monitor closely on Abema/with neutropenia- stable.   # intermittent Hypercalcemia: Ca 10.4-resolved- continue just vit D- monitor for now. FEB Vit D - 90.   # Low B 12 [PCP]- on B12 PO. stable.   # IV access; PIV.   #DISPOSITION: **early AM appt  # Follow up in 4 weeksMD/ labs [CBC/CMP/b12; mag; Ca 27-29;] Dr.B   # I reviewed the blood work- with the patient in detail; also reviewed the imaging independently [as summarized above]; and with the patient in detail.

## 2022-11-17 NOTE — Progress Notes (Signed)
.[place] OFFICE PROGRESS NOTE  Patient Care Team: Ezequiel Kayser, MD as PCP - General (Internal Medicine) Neldon Mc, MD (Inactive) (General Surgery) Everlene Farrier, MD (Obstetrics and Gynecology) Noreene Filbert, MD Forest Gleason, MD (Unknown Physician Specialty) [name], MD as Consulting Physician (Hematology and Oncology)   Cancer Staging  Carcinoma of lower-outer quadrant of right breast in female, estrogen receptor positive [facility]) Staging form: Breast, AJCC 7th Edition - Pathologic: ypT1c,ypN2a, MX - Signed by Haywood Lasso, MD on 03/10/2012 Cancer stage: ypT1c,ypN2a, MX    Oncology History Overview Note  # DEC 2012- RIGHT BREAST CA T2 N2 M0 tumor from biopsy.  Estrogen receptor positive, Progesterone receptor positive.  Current receptor negative by FISH 2. Neoadjuvant chemotherapy started in December of 28 with Cytoxan Adriamycin 3. Started on Taxol weekly chemotherapy. 4. Patient finished 12  cycles of Taxol chemotherapy in May of 2013.     5. Status post right modified radical mastectomy [Dr.Bowers; [place]] June of 2013, ypT1c  yp N2  MO. started also on Lerazole    7. Radiation therapy to the right breast (September of 2013).  Lymph node was positive for HER-2 receptor gene amplification of 2.22.  Will proceed with Herceptin treatment starting in September of 2013.   8.Patient has finished Herceptin (maintenance therapy) in August of 2014 8. Start patient on letrozole from November, 2013. 9. Patient started on Herceptin in September 2013.    10Patient finished 12 months of Herceptin therapy on August, 2014  # 6. Status post left side prophylactic mastectomy.  #Late MAY 2018-RECURRENCE BREAST CA- ER positive/PR negative; ?? HER-2/neu- [biopsy- proven-mediastinal lymph node; in suff for her 2 testing].  [elevated Tumor marker- CT/PET- uptake in Right Mediastinal LN; Sternum [June 2018 EBUS- Dr.Kasa]   # March 10 2017- faslodex +  Abema; OCT 5th CT-PR of mediastinal LN [consent]; Discontinued faslodex sec to insurance issues [last [place] 2023]. JAN 2024- continue Abema + Anastrzole   #August 2020-left chest wall nodule biopsy benign  #August 2019- DVT-right upper extremity; Eliquis; stop end of March 2021 [repeat ultrasound negative/patient preference]; # JAN 2022-Cirrhosis- ? On CT scan-FEB 2022MRI liver- Negative fro overt cirrhosis --------------------------------------------------------------- -    DIAGNOSIS: [ BREAST CANCER- ER/PR/HER2 NEU POS  STAGE:  4   ;GOALS: PALLIATIVE     Carcinoma of lower-outer quadrant of right breast in female, estrogen receptor positive ([place])   INTERVAL HISTORY: Alone.  Ambulating independently.  Kimberly [name] 67 y.o.  female pleasant patient above history of metastatic ER PR positive HER-2/neu positive breast cancer currently on abema+ Faslodex is here for follow-up/ CT scan.  Had a stomach bug 2 weeks ago. Nausea and vomiting appx 10 days- resolved.  Completely resolved.   Has a small pressure ulcer behind right ear. Appetite is good. Fatigued.   Continues to be on Vitamin B12 1000 mg  1 tab QD. Denies any infections.  Denies any nausea vomiting no fever no chills.  Review of Systems  Constitutional:  Positive for malaise/fatigue. Negative for chills, diaphoresis, fever and weight loss.  HENT:  Negative for nosebleeds and sore throat.   Eyes:  Negative for double vision.  Respiratory:  Negative for cough, hemoptysis, sputum production, shortness of breath and wheezing.   Cardiovascular:  Negative for chest pain, palpitations, orthopnea and leg swelling.  Gastrointestinal:  Negative for abdominal pain, blood in stool, constipation, heartburn, melena, nausea and vomiting.  Musculoskeletal:  Positive for back pain and joint pain.  Neurological:  Negative for  dizziness, tingling, focal weakness, weakness and headaches.  Endo/Heme/Allergies:  Does not bruise/bleed easily.   Psychiatric/Behavioral:  Negative for depression. The patient is not nervous/anxious and does not have insomnia.       PAST MEDICAL HISTORY :  Past Medical History:  Diagnosis Date   Arthritis    Cancer of lower-outer quadrant of female breast ([place]) 08/06/2011   RIGHT, chemo and bilateral mastectomies.   High cholesterol    History of kidney stones    Hx of bilateral breast implants    Hypertension    Lung metastases 20118   chemo pills and faslidex shots.   PONV (postoperative nausea and vomiting)    Type II diabetes mellitus ([place])    fasting 140-150   Vertigo    LAST WEEK   Vertigo 01/2017    PAST SURGICAL HISTORY :   Past Surgical History:  Procedure Laterality Date   BREAST BIOPSY  07/2011, 2020   right   BREAST RECONSTRUCTION  03/07/2012   Procedure: BREAST RECONSTRUCTION;  Surgeon: Crissie Reese, MD;  Location: [place];  Service: Plastics;  Laterality: Left;  BREAST RECONSTRUCTION WITH PLACEMENT OF TISSUE EXPANDER TO LEFT BREAST   BREAST RECONSTRUCTION  02/06/2013   CESAREAN SECTION  [id number]   ENDOBRONCHIAL ULTRASOUND N/A 02/24/2017   Procedure: ENDOBRONCHIAL ULTRASOUND;  Surgeon: [name], MD;  Location: [facility];  Service: Cardiopulmonary;  Laterality: N/A;   LATISSIMUS FLAP TO BREAST Right 02/06/2013   Procedure: LATISSIMUS FLAP TO RIGHT BREAST WITH IMPLANT;  Surgeon: Crissie Reese, MD;  Location: [place];  Service: Plastics;  Laterality: Right;   MASTECTOMY  03/07/12   modified right; total left   MODIFIED MASTECTOMY  03/07/2012   Procedure: MODIFIED MASTECTOMY;  Surgeon: Haywood Lasso, MD;  Location: [place];  Service: General;  Laterality: Right;   PORTACATH PLACEMENT  07/2011   SCAR REVISION  03/30/2012   Procedure: SCAR REVISION;  Surgeon: Haywood Lasso, MD;  Location: [place];  Service: General;  Laterality: Right;  CLOSURE OF MASTECTOMY INCISION   WISDOM TOOTH EXTRACTION      FAMILY HISTORY :   Family History  Problem Relation Age of  Onset   Breast cancer Mother    Diabetes Father     SOCIAL HISTORY:   Social History   Tobacco Use   Smoking status: Never   Smokeless tobacco: Never  Vaping Use   Vaping Use: Never used  Substance Use Topics   Alcohol use: No   Drug use: No    ALLERGIES:  is allergic to sulfa antibiotics.  MEDICATIONS:  Current Outpatient Medications  Medication Sig Dispense Refill   abemaciclib (VERZENIO) 100 MG tablet TAKE 1 TABLET BY MOUTH TWICE DAILY. DO NOT CHEW, CRUSH, OR SPLIT TABLETS BEFORE SWALLOWING 56 tablet 6   anastrozole (ARIMIDEX) 1 MG tablet Take 1 tablet (1 mg total) by mouth daily. 30 tablet 4   aspirin EC 81 MG tablet Take 81 mg by mouth daily.     atorvastatin (LIPITOR) 20 MG tablet Take 1 tablet (20 mg total) by mouth daily. 90 tablet 3   Calcium Carb-Cholecalciferol 500-10 MG-MCG TABS Take by mouth.     Cholecalciferol 50 MCG (2000 UT) CAPS Take 3 capsules daily for 3 months, then reduce to 1 capsule daily thereafter for Vitamin D Deficiency.     clotrimazole-betamethasone (LOTRISONE) cream SMARTSIG:Topical Morning-Evening     cyanocobalamin (VITAMIN B12) 1000 MCG tablet 1,000 mcg daily. Take 2 tablets daily for 2 weeks, then reduce  to 1 tablet daily thereafter for Vitamin B12 Deficiency.     gabapentin (NEURONTIN) 300 MG capsule Take 300 mg by mouth at bedtime.     glipiZIDE (GLUCOTROL XL) 10 MG 24 hr tablet Take 1 tablet (10 mg total) by mouth 2 (two) times daily. 180 tablet 3   insulin aspart (NOVOLOG FLEXPEN) 100 UNIT/ML FlexPen Inject 4 Units into the skin 3 (three) times daily with meals. 15 mL 0   Insulin Detemir (LEVEMIR) 100 UNIT/ML Pen Inject 20 Units into the skin at bedtime. 15 mL 0   levothyroxine (SYNTHROID) 25 MCG tablet Take 1 tablet (25 mcg total) by mouth daily before breakfast. 90 tablet 1   lisinopril (ZESTRIL) 10 MG tablet Take 10 mg by mouth daily.     magnesium oxide (MAG-OX) 400 MG tablet Take 1 tablet by mouth daily.     Melatonin 10 MG TABS Take 1  tablet by mouth at bedtime as needed.     TRULICITY [id number] SOPN SMARTSIG:0.5 Milliliter(s) SUB-Q Once a Week     loperamide (IMODIUM A-D) 2 MG tablet Take 2 mg by mouth 4 (four) times daily as needed for diarrhea or loose stools. (Patient not taking: Reported on 11/17/2022)     ondansetron (ZOFRAN-ODT) 4 MG disintegrating tablet Take 1 tablet (4 mg total) by mouth every 8 (eight) hours as needed for nausea or vomiting. 30 tablet 3   No current facility-administered medications for this visit.    PHYSICAL EXAMINATION: ECOG PERFORMANCE STATUS: 1 - Symptomatic but completely ambulatory  BP 111/63 (BP Location: Left Arm, Patient Position: Sitting)   Pulse 100   Temp 97.9 F (36.6 C) (Tympanic)   Resp 18   Wt 163 lb 6.4 oz (74.1 kg)   SpO2 100%   BMI 29.41 kg/m   Filed Weights   11/17/22 1000  Weight: 163 lb 6.4 oz (74.1 kg)      Physical Exam Constitutional:      Comments: She is alone.   HENT:     Head: Normocephalic and atraumatic.     Mouth/Throat:     Pharynx: No oropharyngeal exudate.  Eyes:     Pupils: Pupils are equal, round, and reactive to light.  Cardiovascular:     Rate and Rhythm: Normal rate and regular rhythm.  Pulmonary:     Effort: Pulmonary effort is normal. No respiratory distress.     Breath sounds: Normal breath sounds. No wheezing.  Abdominal:     General: Bowel sounds are normal. There is no distension.     Palpations: Abdomen is soft. There is no mass.     Tenderness: There is no abdominal tenderness. There is no guarding or rebound.  Musculoskeletal:        General: No tenderness. Normal range of motion.     Cervical back: Normal range of motion and neck supple.     Comments: Approximately 1 cm hard nodule felt in the anterior chest wall/left parasternal.  Skin:    General: Skin is warm.  Neurological:     Mental Status: She is alert and oriented to person, place, and time.  Psychiatric:        Mood and Affect: Affect normal.     Maculopapular rash mostly on the left lower extremity-mild on left lower; bilateral upper extremities  LABORATORY DATA:  I have reviewed the data as listed    Component Value Date/Time   NA 133 (L) 11/17/2022 0939   NA 135 11/28/2014 1401   K 3.6  11/17/2022 0939   K 4.7 11/28/2014 1401   CL 100 11/17/2022 0939   CL 99 (L) 11/28/2014 1401   CO2 24 11/17/2022 0939   CO2 28 11/28/2014 1401   GLUCOSE 190 (H) 11/17/2022 0939   GLUCOSE 215 (H) 11/28/2014 1401   BUN 15 11/17/2022 0939   BUN 16 11/28/2014 1401   CREATININE 1.26 (H) 11/17/2022 0939   CREATININE 0.96 11/28/2014 1401   CALCIUM 10.0 11/17/2022 0939   CALCIUM 9.9 11/28/2014 1401   PROT 7.1 11/17/2022 0939   PROT 7.6 11/28/2014 1401   ALBUMIN 3.5 11/17/2022 0939   ALBUMIN 4.5 11/28/2014 1401   AST 45 (H) 11/17/2022 0939   ALT 31 11/17/2022 0939   ALT 28 11/28/2014 1401   ALKPHOS 103 11/17/2022 0939   ALKPHOS 78 11/28/2014 1401   BILITOT 0.7 11/17/2022 0939   GFRNONAA 47 (L) 11/17/2022 0939   GFRNONAA >60 11/28/2014 1401   GFRAA >60 05/30/2020 1022   GFRAA >60 11/28/2014 1401    No results found for: "SPEP", "UPEP"  Lab Results  Component Value Date   WBC 2.6 (L) 11/17/2022   NEUTROABS 1.2 (L) 11/17/2022   HGB 10.2 (L) 11/17/2022   HCT 30.0 (L) 11/17/2022   MCV 100.0 11/17/2022   PLT 213 11/17/2022      Chemistry      Component Value Date/Time   NA 133 (L) 11/17/2022 0939   NA 135 11/28/2014 1401   K 3.6 11/17/2022 0939   K 4.7 11/28/2014 1401   CL 100 11/17/2022 0939   CL 99 (L) 11/28/2014 1401   CO2 24 11/17/2022 0939   CO2 28 11/28/2014 1401   BUN 15 11/17/2022 0939   BUN 16 11/28/2014 1401   CREATININE 1.26 (H) 11/17/2022 0939   CREATININE 0.96 11/28/2014 1401      Component Value Date/Time   CALCIUM 10.0 11/17/2022 0939   CALCIUM 9.9 11/28/2014 1401   ALKPHOS 103 11/17/2022 0939   ALKPHOS 78 11/28/2014 1401   AST 45 (H) 11/17/2022 0939   ALT 31 11/17/2022 0939   ALT 28 11/28/2014  1401   BILITOT 0.7 11/17/2022 0939       RADIOGRAPHIC STUDIES: I have personally reviewed the radiological images as listed and agreed with the findings in the report. No results found.   ASSESSMENT & PLAN:  Carcinoma of lower-outer quadrant of right breast in female, estrogen receptor positive ([place])  # Recurrent breast cancer-ER positive PR negative ? HER-2/neu-currently on abema+ Anastrozole. JAN 2024-Discontinued faslodex sec to insurance issues.]  MARCH 15th, 2023- CT CAP-  No definitive evidence to suggest recurrent or metastatic disease in the chest, abdomen or pelvis. Stable.  #  Currently on  Abema 100 BID; Labs today-ANC- 1.2  Hb-10-11; platelets-N-  stable. continue Anastrazole [JAN [place] [id number]; recommend break on week ends given ANC < 0.1. stable.      # Bilateral itchy watery eyes and sneezing. No improvement from Montelukast- on Patanol prn.  stable.   # Hyponatremia [131]/CKD stage III- sec to poor hydration/diarrhea- ? abema- recommend increasing fluids/salt intake electrolytes. stable.   # Hypomagnesia: 1.6- on OTC mag-stable.   # DM- BG- 140s/p endocrinology evaluation; with diet and medications [trucilicity; Novolog]- stable.   # recurrent UTI/ ? Suprapubic tenderness- S/p eval with urology.  Monitor closely on Abema/with neutropenia- stable.   # intermittent Hypercalcemia: Ca 10.4-resolved- continue just vit D- monitor for now. FEB Vit D - 90.   # Low B 12 [PCP]- on B12 PO.  stable.   # IV access; PIV.   #DISPOSITION: **early AM appt  # Follow up in 4 weeksMD/ labs [CBC/CMP/b12; mag; Ca 27-29;] Dr.B   # I reviewed the blood work- with the patient in detail; also reviewed the imaging independently [as summarized above]; and with the patient in detail.        Orders Placed This Encounter  Procedures   CBC with Differential ([place] Only)    Standing Status:   Future    Standing Expiration Date:   11/02/2023   CMP ([place] only)    Standing  Status:   Future    Standing Expiration Date:   11/17/2023   Vitamin B12    Standing Status:   Future    Standing Expiration Date:   11/17/2023   Magnesium    Standing Status:   Future    Standing Expiration Date:   11/17/2023   Cancer antigen 27.29    Standing Status:   Future    Standing Expiration Date:   11/17/2023      All questions were answered. The patient knows to call the clinic with any problems, questions or concerns.      [name], MD 11/17/2022 10:38 AM

## 2022-11-17 NOTE — Progress Notes (Signed)
Had a stomach bug 2 weeks ago. Completely resolved. Has a small pressure ulcer behind right ear. Appetite is good. Fatigued.

## 2022-11-18 LAB — CANCER ANTIGEN 27.29: CA 27.29: 30.7 U/mL (ref 0.0–38.6)

## 2022-11-23 ENCOUNTER — Encounter: Payer: PPO | Admitting: Hospice and Palliative Medicine

## 2022-11-23 ENCOUNTER — Telehealth: Payer: Self-pay | Admitting: *Deleted

## 2022-11-23 ENCOUNTER — Other Ambulatory Visit: Payer: Self-pay | Admitting: *Deleted

## 2022-11-23 ENCOUNTER — Encounter: Payer: Self-pay | Admitting: Internal Medicine

## 2022-11-23 DIAGNOSIS — R300 Dysuria: Secondary | ICD-10-CM

## 2022-11-23 NOTE — Telephone Encounter (Signed)
Attempted to reach patient to set up an apt for urine culture/ua. We can follow-up in [facility] either via phone call or virtual visit or in person (pt's preference) to go over test results. Msg left for patient to return my phone call. Mychart msg also sent to patient.

## 2022-11-24 ENCOUNTER — Telehealth: Payer: Self-pay | Admitting: *Deleted

## 2022-11-24 ENCOUNTER — Inpatient Hospital Stay: Payer: PPO

## 2022-11-24 ENCOUNTER — Inpatient Hospital Stay (HOSPITAL_BASED_OUTPATIENT_CLINIC_OR_DEPARTMENT_OTHER): Payer: PPO | Admitting: Hospice and Palliative Medicine

## 2022-11-24 ENCOUNTER — Other Ambulatory Visit: Payer: Self-pay

## 2022-11-24 ENCOUNTER — Encounter: Payer: Self-pay | Admitting: Hospice and Palliative Medicine

## 2022-11-24 ENCOUNTER — Other Ambulatory Visit: Payer: Self-pay | Admitting: *Deleted

## 2022-11-24 VITALS — BP 105/69 | HR 94 | Temp 96.7°F | Resp 20 | Ht 62.5 in | Wt 163.4 lb

## 2022-11-24 DIAGNOSIS — N3090 Cystitis, unspecified without hematuria: Secondary | ICD-10-CM

## 2022-11-24 DIAGNOSIS — R300 Dysuria: Secondary | ICD-10-CM

## 2022-11-24 DIAGNOSIS — C50511 Malignant neoplasm of lower-outer quadrant of right female breast: Secondary | ICD-10-CM | POA: Diagnosis not present

## 2022-11-24 MED ORDER — CIPROFLOXACIN HCL 500 MG PO TABS: 500.0000 mg | ORAL_TABLET | Freq: Two times a day (BID) | ORAL | 0 refills | Status: DC

## 2022-11-24 NOTE — Progress Notes (Signed)
Symptom Management [place] at [facility] [phone] [phone]  Patient Care Team: Ezequiel Kayser, MD as PCP - General (Internal Medicine) Neldon Mc, MD (Inactive) (General Surgery) Everlene Farrier, MD (Obstetrics and Gynecology) Noreene Filbert, MD Forest Gleason, MD (Unknown Physician Specialty) [name], MD as Consulting Physician (Hematology and Oncology)   NAME OF PATIENT: Kimberly [name]  [id number]  1956/07/11   DATE OF VISIT: 11/24/22  REASON FOR CONSULT: Kimberly [name] is a 67 y.o. female with multiple medical problems including stage IV ER/PR/HER2 positive breast cancer diagnosed in 2016 with recurrence in 2018.  INTERVAL HISTORY: Patient saw Dr. Rogue Bussing on 11/17/2022 with CT chest abdomen and pelvis without definitive evidence to suggest recurrent or metastatic disease.  Plan was to continue treatment with abema + anastrozole.  Patient has history of recurrent UTIs with previous evaluation in 2022 by urology.  Abema has been previously held due to recurrent UTIs.  Prophylactic antibiotics have been discussed in the past but alternative options such as trying to stabilize her hyperglycemia and UTI prevention strategies such as cranberry tablets, d-mannose, and daily probiotic are recommended instead.  Her most recent UTI was approximately 7 months ago in August 2023 at which time culture was positive for pansensitive E. Coli and patient received treatment with Cipro with complete resolution of symptoms.  Today, patient presents to clinic for evaluation of dysuria.  Patient reports 5 to 6 days of suprapubic pressure, bloating, dysuria, urinary frequency and urgency, and low back pain.  The symptoms are the same as her usual UTI symptoms.  No fever or chills.  She denies hematuria.  She has been using over-the-counter Azo.  Denies any neurologic complaints. Denies recent fevers or illnesses. Denies  any easy bleeding or bruising. Reports good appetite and denies weight loss. Denies chest pain. Denies any nausea, vomiting, constipation, or diarrhea. Patient offers no further specific complaints today.   PAST MEDICAL HISTORY: Past Medical History:  Diagnosis Date   Arthritis    Cancer of lower-outer quadrant of female breast ([place]) 08/06/2011   RIGHT, chemo and bilateral mastectomies.   High cholesterol    History of kidney stones    Hx of bilateral breast implants    Hypertension    Lung metastases 20118   chemo pills and faslidex shots.   PONV (postoperative nausea and vomiting)    Type II diabetes mellitus ([place])    fasting 140-150   Vertigo    LAST WEEK   Vertigo 01/2017    PAST SURGICAL HISTORY:  Past Surgical History:  Procedure Laterality Date   BREAST BIOPSY  07/2011, 2020   right   BREAST RECONSTRUCTION  03/07/2012   Procedure: BREAST RECONSTRUCTION;  Surgeon: Crissie Reese, MD;  Location: [facility];  Service: Plastics;  Laterality: Left;  BREAST RECONSTRUCTION WITH PLACEMENT OF TISSUE EXPANDER TO LEFT BREAST   BREAST RECONSTRUCTION  02/06/2013   CESAREAN SECTION  [id number]   ENDOBRONCHIAL ULTRASOUND N/A 02/24/2017   Procedure: ENDOBRONCHIAL ULTRASOUND;  Surgeon: [name], MD;  Location: [facility];  Service: Cardiopulmonary;  Laterality: N/A;   LATISSIMUS FLAP TO BREAST Right 02/06/2013   Procedure: LATISSIMUS FLAP TO RIGHT BREAST WITH IMPLANT;  Surgeon: Crissie Reese, MD;  Location: [facility];  Service: Plastics;  Laterality: Right;   MASTECTOMY  03/07/12   modified right; total left   MODIFIED MASTECTOMY  03/07/2012   Procedure: MODIFIED MASTECTOMY;  Surgeon: Haywood Lasso, MD;  Location: [facility];  Service: General;  Laterality:  Right;   PORTACATH PLACEMENT  07/2011   SCAR REVISION  03/30/2012   Procedure: SCAR REVISION;  Surgeon: Haywood Lasso, MD;  Location: [facility];  Service: General;  Laterality: Right;  CLOSURE OF MASTECTOMY INCISION   WISDOM TOOTH  EXTRACTION      HEMATOLOGY/ONCOLOGY HISTORY:  Oncology History Overview Note  # DEC 2012- RIGHT BREAST CA T2 N2 M0 tumor from biopsy.  Estrogen receptor positive, Progesterone receptor positive.  Current receptor negative by FISH 2. Neoadjuvant chemotherapy started in December of 28 with Cytoxan Adriamycin 3. Started on Taxol weekly chemotherapy. 4. Patient finished 12  cycles of Taxol chemotherapy in May of 2013.     5. Status post right modified radical mastectomy [Dr.Bowers; [place]] June of 2013, ypT1c  yp N2  MO. started also on Lerazole    7. Radiation therapy to the right breast (September of 2013).  Lymph node was positive for HER-2 receptor gene amplification of 2.22.  Will proceed with Herceptin treatment starting in September of 2013.   8.Patient has finished Herceptin (maintenance therapy) in August of 2014 8. Start patient on letrozole from November, 2013. 9. Patient started on Herceptin in September 2013.    10Patient finished 12 months of Herceptin therapy on August, 2014  # 6. Status post left side prophylactic mastectomy.  #Late MAY 2018-RECURRENCE BREAST CA- ER positive/PR negative; ?? HER-2/neu- [biopsy- proven-mediastinal lymph node; in suff for her 2 testing].  [elevated Tumor marker- CT/PET- uptake in Right Mediastinal LN; Sternum [June 2018 EBUS- Dr.Kasa]   # March 10 2017- faslodex + Abema; OCT 5th CT-PR of mediastinal LN [consent]; Discontinued faslodex sec to insurance issues [last [place] 2023]. JAN 2024- continue Abema + Anastrzole   #August 2020-left chest wall nodule biopsy benign  #August 2019- DVT-right upper extremity; Eliquis; stop end of March 2021 [repeat ultrasound negative/patient preference]; # JAN 2022-Cirrhosis- ? On CT scan-FEB 2022MRI liver- Negative fro overt cirrhosis --------------------------------------------------------------- -    DIAGNOSIS: [ BREAST CANCER- ER/PR/HER2 NEU POS  STAGE:  4   ;GOALS: PALLIATIVE     Carcinoma of lower-outer  quadrant of right breast in female, estrogen receptor positive ([place])    ALLERGIES:  is allergic to sulfa antibiotics.  MEDICATIONS:  Current Outpatient Medications  Medication Sig Dispense Refill   abemaciclib (VERZENIO) 100 MG tablet TAKE 1 TABLET BY MOUTH TWICE DAILY. DO NOT CHEW, CRUSH, OR SPLIT TABLETS BEFORE SWALLOWING 56 tablet 6   anastrozole (ARIMIDEX) 1 MG tablet Take 1 tablet (1 mg total) by mouth daily. 30 tablet 4   aspirin EC 81 MG tablet Take 81 mg by mouth daily.     atorvastatin (LIPITOR) 20 MG tablet Take 1 tablet (20 mg total) by mouth daily. 90 tablet 3   Calcium Carb-Cholecalciferol 500-10 MG-MCG TABS Take by mouth.     Cholecalciferol 50 MCG (2000 UT) CAPS Take 3 capsules daily for 3 months, then reduce to 1 capsule daily thereafter for Vitamin D Deficiency.     clotrimazole-betamethasone (LOTRISONE) cream SMARTSIG:Topical Morning-Evening     cyanocobalamin (VITAMIN B12) 1000 MCG tablet 1,000 mcg daily. Take 2 tablets daily for 2 weeks, then reduce to 1 tablet daily thereafter for Vitamin B12 Deficiency.     gabapentin (NEURONTIN) 300 MG capsule Take 300 mg by mouth at bedtime.     glipiZIDE (GLUCOTROL XL) 10 MG 24 hr tablet Take 1 tablet (10 mg total) by mouth 2 (two) times daily. 180 tablet 3   insulin aspart (NOVOLOG FLEXPEN) 100 UNIT/ML FlexPen  Inject 4 Units into the skin 3 (three) times daily with meals. 15 mL 0   Insulin Detemir (LEVEMIR) 100 UNIT/ML Pen Inject 20 Units into the skin at bedtime. 15 mL 0   levothyroxine (SYNTHROID) 25 MCG tablet Take 1 tablet (25 mcg total) by mouth daily before breakfast. 90 tablet 1   lisinopril (ZESTRIL) 10 MG tablet Take 10 mg by mouth daily.     loperamide (IMODIUM A-D) 2 MG tablet Take 2 mg by mouth 4 (four) times daily as needed for diarrhea or loose stools. (Patient not taking: Reported on 11/17/2022)     magnesium oxide (MAG-OX) 400 MG tablet Take 1 tablet by mouth daily.     Melatonin 10 MG TABS Take 1 tablet by mouth at  bedtime as needed.     ondansetron (ZOFRAN-ODT) 4 MG disintegrating tablet Take 1 tablet (4 mg total) by mouth every 8 (eight) hours as needed for nausea or vomiting. 30 tablet 3   TRULICITY [id number] [id number] SOPN SMARTSIG:0.5 Milliliter(s) SUB-Q Once a Week     No current facility-administered medications for this visit.    VITAL SIGNS: There were no vitals taken for this visit. There were no vitals filed for this visit.  Estimated body mass index is 29.41 kg/m as calculated from the following:   Height as of 08/24/22: 5' 2.5" (1.588 m).   Weight as of 11/17/22: 163 lb 6.4 oz (74.1 kg).  LABS: CBC:    Component Value Date/Time   WBC 2.6 (L) 11/17/2022 0939   WBC 2.5 (L) 10/19/2022 0854   HGB 10.2 (L) 11/17/2022 0939   HGB 12.9 11/28/2014 1401   HCT 30.0 (L) 11/17/2022 0939   HCT 38.8 11/28/2014 1401   PLT 213 11/17/2022 0939   PLT 369 11/28/2014 1401   MCV 100.0 11/17/2022 0939   MCV 84 11/28/2014 1401   NEUTROABS 1.2 (L) 11/17/2022 0939   NEUTROABS 4.1 11/28/2014 1401   LYMPHSABS 1.1 11/17/2022 0939   LYMPHSABS 1.9 11/28/2014 1401   MONOABS 0.1 11/17/2022 0939   MONOABS 0.3 11/28/2014 1401   EOSABS 0.1 11/17/2022 0939   EOSABS 0.1 11/28/2014 1401   BASOSABS 0.1 11/17/2022 0939   BASOSABS 0.1 11/28/2014 1401   Comprehensive Metabolic Panel:    Component Value Date/Time   NA 133 (L) 11/17/2022 0939   NA 135 11/28/2014 1401   K 3.6 11/17/2022 0939   K 4.7 11/28/2014 1401   CL 100 11/17/2022 0939   CL 99 (L) 11/28/2014 1401   CO2 24 11/17/2022 0939   CO2 28 11/28/2014 1401   BUN 15 11/17/2022 0939   BUN 16 11/28/2014 1401   CREATININE 1.26 (H) 11/17/2022 0939   CREATININE 0.96 11/28/2014 1401   GLUCOSE 190 (H) 11/17/2022 0939   GLUCOSE 215 (H) 11/28/2014 1401   CALCIUM 10.0 11/17/2022 0939   CALCIUM 9.9 11/28/2014 1401   AST 45 (H) 11/17/2022 0939   ALT 31 11/17/2022 0939   ALT 28 11/28/2014 1401   ALKPHOS 103 11/17/2022 0939   ALKPHOS 78 11/28/2014 1401    BILITOT 0.7 11/17/2022 0939   PROT 7.1 11/17/2022 0939   PROT 7.6 11/28/2014 1401   ALBUMIN 3.5 11/17/2022 0939   ALBUMIN 4.5 11/28/2014 1401    RADIOGRAPHIC STUDIES: CT CHEST ABDOMEN PELVIS W CONTRAST  Result Date: 11/15/2022 CLINICAL DATA:  Invasive breast cancer stage IV. Assess treatment response. On chemotherapy. * Tracking Code: BO * EXAM: CT CHEST, ABDOMEN, AND PELVIS WITH CONTRAST TECHNIQUE: Multidetector CT imaging of the chest,  abdomen and pelvis was performed following the standard protocol during bolus administration of intravenous contrast. RADIATION DOSE REDUCTION: This exam was performed according to the departmental dose-optimization program which includes automated exposure control, adjustment of the mA and/or kV according to patient size and/or use of iterative reconstruction technique. CONTRAST:  163mL OMNIPAQUE IOHEXOL 300 MG/ML  SOLN COMPARISON:  CT 07/19/2022 FINDINGS: CT CHEST FINDINGS Cardiovascular: The heart is nonenlarged. No pericardial effusion. Coronary artery calcifications are seen. There also calcifications along the mitral valve annulus. The thoracic aorta has a normal course and caliber with mild atherosclerotic plaque. Mediastinum/Nodes: Surgical clips seen related to right-sided axillary lymph node dissection. Bilateral breast implants. There is some hazy stranding in the right axillary region. No discrete abnormal lymph node enlargement identified in the thorax including specifically axillary region, hilum, supraclavicular, chest wall, internal mammary chain, hilum and mediastinum. There are some calcified nodes in the right hilum and right side of the mediastinum consistent with old granulomatous disease and are unchanged from prior. Normal caliber thoracic esophagus. Lungs/Pleura: Once again there is some patchy parenchymal opacity along the right upper lobe with some pleural thickening which is stable and could be posttreatment related other areas of peripheral  interstitial septal thickening, scarring fibrotic changes with pleural thickening along the anterior right hemithorax more caudal. Dependent atelectasis as well on both lower lobes and towards the lingula. No consolidation, pneumothorax or effusion otherwise identified. No dominant lung mass. Mild left lower lobe bronchiectasis. Musculoskeletal: Mild degenerative changes along the spine. There are some surgical clips posterior along the right lower chest along the paraspinal muscle region, nonspecific and unchanged. CT ABDOMEN PELVIS FINDINGS Hepatobiliary: Fatty liver infiltration. Patent portal vein. No space-occupying liver lesion. Dependent stones in the nondilated gallbladder. Pancreas: Unremarkable. No pancreatic ductal dilatation or surrounding inflammatory changes. Spleen: Tiny low-attenuation lesion along the superior aspect of the spleen is unchanged from prior. Too small to completely characterize but favor a benign process and this has been present since at least February 2021. Multiple left upper quadrant collateral vessels are identified, nonspecific. Adrenals/Urinary Tract: Adrenal glands are preserved. Right kidney is preserved. No enhancing mass. No right-sided renal collecting system dilatation. The right ureter has a normal course and caliber down to the bladder. Preserved contours of the urinary bladder. Preserved left ureter and collecting system. No enhancing left-sided renal mass. Once again there is a Bosniak 2 4.4 cm left-sided renal cyst posteriorly along the midportion of the left kidney with a thin septa. Again no specific imaging follow-up. Stomach/Bowel: Large bowel has a normal course and caliber with scattered colonic stool. Left-sided colonic diverticula. Normal retrocecal appendix in the right lower quadrant. Stomach and small bowel are nondilated. Vascular/Lymphatic: Normal caliber aorta and IVC with some scattered vascular calcifications. No specific abnormal lymph node  enlargement identified in the abdomen and pelvis. Reproductive: Uterus and bilateral adnexa are unremarkable. Other: No abdominal wall hernia or abnormality. No abdominopelvic ascites. Musculoskeletal: Scattered degenerative changes of the spine and pelvis. Stable compression of the superior endplate of L3 with Schmorl's node deformity. IMPRESSION: No significant interval change. No new developing mass lesion, fluid collection or lymph node enlargement in the chest, abdomen and pelvis. Postsurgical changes in treatment changes involving the right breast, axillary region. Scattered stool.  No obstruction.  Left-sided colonic diverticula. Gallstones. Electronically Signed   By: Jill Side M.D.   On: 11/15/2022 16:13    PERFORMANCE STATUS (ECOG) : 1 - Symptomatic but completely ambulatory  Review of Systems Unless otherwise noted,  a complete review of systems is negative.  Physical Exam General: NAD Cardiovascular: regular rate and rhythm Pulmonary: clear ant fields Abdomen: soft, nontender, + bowel sounds GU: no suprapubic tenderness, no CVA tenderness Extremities: no edema, no joint deformities Skin: no rashes Neurological: Grossly nonfocal  IMPRESSION/PLAN: Cystitis -symptoms appear consistent with UTI.  Will send patient home with urinary collection kit for urine culture as patient had difficulty providing sample this morning.  Start empirically on Cipro 500mg  every 12 hours x 3 days.  ED triggers reviewed in detail.  RTC in 3 weeks to see Dr. B or sooner if needed  Patient expressed understanding and was in agreement with this plan. She also understands that She can call clinic at any time with any questions, concerns, or complaints.   Thank you for allowing me to participate in the care of this very pleasant patient.   Time Total: 15 minutes  Visit consisted of counseling and education dealing with the complex and emotionally intense issues of symptom management in the setting of  serious illness.Greater than 50%  of this time was spent counseling and coordinating care related to the above assessment and plan.  Signed by: Altha Harm, PhD, NP-C

## 2022-11-24 NOTE — Telephone Encounter (Signed)
Rn returned pt's phone call and I discussed the recommendation to hold glipizide x 3 days and check blood glucose levels three times a day while on the cipro. She agreed. She will collect the urine culture and try to bring it back no later than Friday.

## 2022-11-24 NOTE — Telephone Encounter (Signed)
Left vm for patient re: the Cipro medication and risk factors for hypoglycemia. I asked pt to hold her glipizide x 3 days while on this antibiotics. I also asked pt to return my phone call to confirm she rcvd this msg.

## 2022-11-24 NOTE — Telephone Encounter (Signed)
Patient called and left voice mail that she is returning [place] call

## 2022-11-25 ENCOUNTER — Other Ambulatory Visit: Payer: Self-pay

## 2022-11-25 DIAGNOSIS — C50511 Malignant neoplasm of lower-outer quadrant of right female breast: Secondary | ICD-10-CM | POA: Diagnosis not present

## 2022-11-25 DIAGNOSIS — R300 Dysuria: Secondary | ICD-10-CM

## 2022-11-25 LAB — URINALYSIS, COMPLETE (UACMP) WITH MICROSCOPIC
Bacteria, UA: NONE SEEN
Bilirubin Urine: NEGATIVE
Glucose, UA: >=500 mg/dL — AB
Hgb urine dipstick: NEGATIVE
Ketones, ur: NEGATIVE mg/dL
Nitrite: NEGATIVE
Protein, ur: NEGATIVE mg/dL
Specific Gravity, Urine: 1.025 (ref 1.005–1.030)
WBC, UA: >50 WBC/hpf (ref 0–5)
pH: 5 (ref 5.0–8.0)

## 2022-11-26 LAB — URINE CULTURE: Culture: NO GROWTH

## 2022-12-15 ENCOUNTER — Inpatient Hospital Stay (HOSPITAL_BASED_OUTPATIENT_CLINIC_OR_DEPARTMENT_OTHER): Payer: PPO | Admitting: Internal Medicine

## 2022-12-15 ENCOUNTER — Inpatient Hospital Stay: Payer: PPO | Attending: Internal Medicine

## 2022-12-15 DIAGNOSIS — Z794 Long term (current) use of insulin: Secondary | ICD-10-CM | POA: Diagnosis not present

## 2022-12-15 DIAGNOSIS — Z79811 Long term (current) use of aromatase inhibitors: Secondary | ICD-10-CM | POA: Diagnosis not present

## 2022-12-15 DIAGNOSIS — C7800 Secondary malignant neoplasm of unspecified lung: Secondary | ICD-10-CM | POA: Insufficient documentation

## 2022-12-15 DIAGNOSIS — R410 Disorientation, unspecified: Secondary | ICD-10-CM | POA: Diagnosis not present

## 2022-12-15 DIAGNOSIS — Z7985 Long-term (current) use of injectable non-insulin antidiabetic drugs: Secondary | ICD-10-CM | POA: Diagnosis not present

## 2022-12-15 DIAGNOSIS — Z170 Estrogen receptor positive status [ER+]: Secondary | ICD-10-CM | POA: Diagnosis not present

## 2022-12-15 DIAGNOSIS — Z9013 Acquired absence of bilateral breasts and nipples: Secondary | ICD-10-CM | POA: Diagnosis not present

## 2022-12-15 DIAGNOSIS — Z7982 Long term (current) use of aspirin: Secondary | ICD-10-CM | POA: Insufficient documentation

## 2022-12-15 DIAGNOSIS — C50511 Malignant neoplasm of lower-outer quadrant of right female breast: Secondary | ICD-10-CM | POA: Insufficient documentation

## 2022-12-15 DIAGNOSIS — Z7984 Long term (current) use of oral hypoglycemic drugs: Secondary | ICD-10-CM | POA: Insufficient documentation

## 2022-12-15 DIAGNOSIS — N183 Chronic kidney disease, stage 3 unspecified: Secondary | ICD-10-CM | POA: Insufficient documentation

## 2022-12-15 DIAGNOSIS — I129 Hypertensive chronic kidney disease with stage 1 through stage 4 chronic kidney disease, or unspecified chronic kidney disease: Secondary | ICD-10-CM | POA: Insufficient documentation

## 2022-12-15 DIAGNOSIS — R270 Ataxia, unspecified: Secondary | ICD-10-CM | POA: Insufficient documentation

## 2022-12-15 DIAGNOSIS — Z7901 Long term (current) use of anticoagulants: Secondary | ICD-10-CM | POA: Diagnosis not present

## 2022-12-15 DIAGNOSIS — Z79899 Other long term (current) drug therapy: Secondary | ICD-10-CM | POA: Diagnosis not present

## 2022-12-15 DIAGNOSIS — Z87440 Personal history of urinary (tract) infections: Secondary | ICD-10-CM | POA: Diagnosis not present

## 2022-12-15 DIAGNOSIS — E1122 Type 2 diabetes mellitus with diabetic chronic kidney disease: Secondary | ICD-10-CM | POA: Diagnosis not present

## 2022-12-15 DIAGNOSIS — N952 Postmenopausal atrophic vaginitis: Secondary | ICD-10-CM | POA: Diagnosis not present

## 2022-12-15 DIAGNOSIS — E871 Hypo-osmolality and hyponatremia: Secondary | ICD-10-CM | POA: Insufficient documentation

## 2022-12-15 LAB — CBC WITH DIFFERENTIAL (CANCER CENTER ONLY)
Abs Immature Granulocytes: 0 10*3/uL (ref 0.00–0.07)
Basophils Absolute: 0.1 10*3/uL (ref 0.0–0.1)
Basophils Relative: 2 %
Eosinophils Absolute: 0 10*3/uL (ref 0.0–0.5)
Eosinophils Relative: 2 %
HCT: 29.1 % — ABNORMAL LOW (ref 36.0–46.0)
Hemoglobin: 9.9 g/dL — ABNORMAL LOW (ref 12.0–15.0)
Immature Granulocytes: 0 %
Lymphocytes Relative: 53 %
Lymphs Abs: 1.5 10*3/uL (ref 0.7–4.0)
MCH: 34.1 pg — ABNORMAL HIGH (ref 26.0–34.0)
MCHC: 34 g/dL (ref 30.0–36.0)
MCV: 100.3 fL — ABNORMAL HIGH (ref 80.0–100.0)
Monocytes Absolute: 0.1 10*3/uL (ref 0.1–1.0)
Monocytes Relative: 5 %
Neutro Abs: 1 10*3/uL — ABNORMAL LOW (ref 1.7–7.7)
Neutrophils Relative %: 38 %
Platelet Count: 186 10*3/uL (ref 150–400)
RBC: 2.9 MIL/uL — ABNORMAL LOW (ref 3.87–5.11)
RDW: 13.2 % (ref 11.5–15.5)
WBC Count: 2.7 10*3/uL — ABNORMAL LOW (ref 4.0–10.5)
nRBC: 0 % (ref 0.0–0.2)

## 2022-12-15 LAB — CMP (CANCER CENTER ONLY)
ALT: 27 U/L (ref 0–44)
AST: 40 U/L (ref 15–41)
Albumin: 3.2 g/dL — ABNORMAL LOW (ref 3.5–5.0)
Alkaline Phosphatase: 89 U/L (ref 38–126)
Anion gap: 7 (ref 5–15)
BUN: 20 mg/dL (ref 8–23)
CO2: 23 mmol/L (ref 22–32)
Calcium: 9.6 mg/dL (ref 8.9–10.3)
Chloride: 101 mmol/L (ref 98–111)
Creatinine: 1.29 mg/dL — ABNORMAL HIGH (ref 0.44–1.00)
GFR, Estimated: 45 mL/min — ABNORMAL LOW (ref >60)
Glucose, Bld: 224 mg/dL — ABNORMAL HIGH (ref 70–99)
Potassium: 3.8 mmol/L (ref 3.5–5.1)
Sodium: 131 mmol/L — ABNORMAL LOW (ref 135–145)
Total Bilirubin: 0.6 mg/dL (ref 0.3–1.2)
Total Protein: 6.3 g/dL — ABNORMAL LOW (ref 6.5–8.1)

## 2022-12-15 LAB — MAGNESIUM: Magnesium: 1.6 mg/dL — ABNORMAL LOW (ref 1.7–2.4)

## 2022-12-15 LAB — VITAMIN B12: Vitamin B-12: 583 pg/mL (ref 180–914)

## 2022-12-15 NOTE — Progress Notes (Signed)
[name] OFFICE PROGRESS NOTE  Patient Care Team: Mickey Farber, MD as PCP - General (Internal Medicine) Cyndia Bent, MD (Inactive) (General Surgery) Harold Hedge, MD (Obstetrics and Gynecology) Carmina Miller, MD Johney Maine, MD (Unknown Physician Specialty) Earna Coder, MD as Consulting Physician (Hematology and Oncology)   Cancer Staging  Carcinoma of lower-outer quadrant of right breast in female, estrogen receptor positive Staging form: Breast, AJCC 7th Edition - Pathologic: ypT1c,ypN2a, MX - Signed by Currie Paris, MD on 03/10/2012 Cancer stage: ypT1c,ypN2a, MX    Oncology History Overview Note  # DEC 2012- RIGHT BREAST CA T2 N2 M0 tumor from biopsy.  Estrogen receptor positive, Progesterone receptor positive.  Current receptor negative by FISH 2. Neoadjuvant chemotherapy started in December of 28 with Cytoxan Adriamycin 3. Started on Taxol weekly chemotherapy. 4. Patient finished 12  cycles of Taxol chemotherapy in May of 2013.     5. Status post right modified radical mastectomy [Dr.Bowers; [place]] June of 2013, ypT1c  yp N2  MO. started also on Lerazole    7. Radiation therapy to the right breast (September of 2013).  Lymph node was positive for HER-2 receptor gene amplification of 2.22.  Will proceed with Herceptin treatment starting in September of 2013.   8.Patient has finished Herceptin (maintenance therapy) in August of 2014 8. Start patient on letrozole from November, 2013. 9. Patient started on Herceptin in September 2013.    10Patient finished 12 months of Herceptin therapy on August, 2014  # 6. Status post left side prophylactic mastectomy.  #Late MAY 2018-RECURRENCE BREAST CA- ER positive/PR negative; ?? HER-2/neu- [biopsy- proven-mediastinal lymph node; in suff for her 2 testing].  [elevated Tumor marker- CT/PET- uptake in Right Mediastinal LN; Sternum [June 2018 EBUS- Dr.Kasa]   # March 10 2017- faslodex + Abema; OCT  5th CT-PR of mediastinal LN [consent]; Discontinued faslodex sec to insurance issues [last DEC 2023]. JAN 2024- continue Abema + Anastrzole   #August 2020-left chest wall nodule biopsy benign  #August 2019- DVT-right upper extremity; Eliquis; stop end of March 2021 [repeat ultrasound negative/patient preference]; # JAN 2022-Cirrhosis- ? On CT scan-FEB liver- Negative fro overt cirrhosis --------------------------------------------------------------- -    DIAGNOSIS: [ BREAST CANCER- ER/PR/HER2 NEU POS  STAGE:  4   ;GOALS: PALLIATIVE     Carcinoma of lower-outer quadrant of right breast in female, estrogen receptor positive   INTERVAL HISTORY: Alone.  Ambulating independently.  Kimberly [name] 67 y.o.  female pleasant patient above history of metastatic ER PR positive HER-2/neu positive breast cancer currently on abema+ Faslodex is here for follow-up.  In the interim patient was treated for UTI-with ciprofloxacin.  Symptoms resolved.  Currently off gabapentin 300 mg qhs- sec to ataxia/disorientation.   Appetite is good. Fatigued. Denies any more infections.  Denies any nausea vomiting no fever no chills.  Review of Systems  Constitutional:  Positive for malaise/fatigue. Negative for chills, diaphoresis, fever and weight loss.  HENT:  Negative for nosebleeds and sore throat.   Eyes:  Negative for double vision.  Respiratory:  Negative for cough, hemoptysis, sputum production, shortness of breath and wheezing.   Cardiovascular:  Negative for chest pain, palpitations, orthopnea and leg swelling.  Gastrointestinal:  Negative for abdominal pain, blood in stool, constipation, heartburn, melena, nausea and vomiting.  Musculoskeletal:  Positive for back pain and joint pain.  Neurological:  Negative for dizziness, tingling, focal weakness, weakness and headaches.  Endo/Heme/Allergies:  Does not bruise/bleed easily.  Psychiatric/Behavioral:  Negative for depression.  The patient is  not nervous/anxious and does not have insomnia.       PAST MEDICAL HISTORY :  Past Medical History:  Diagnosis Date   Arthritis    Cancer of lower-outer quadrant of female breast (HCC) 08/06/2011   RIGHT, chemo and bilateral mastectomies.   High cholesterol    History of kidney stones    Hx of bilateral breast implants    Hypertension    Lung metastases 20118   chemo pills and faslidex shots.   PONV (postoperative nausea and vomiting)    Type II diabetes mellitus (HCC)    fasting 140-150   Vertigo    LAST WEEK   Vertigo 01/2017    PAST SURGICAL HISTORY :   Past Surgical History:  Procedure Laterality Date   BREAST BIOPSY  07/2011, 2020   right   BREAST RECONSTRUCTION  03/07/2012   Procedure: BREAST RECONSTRUCTION;  Surgeon: Etter Sjogren, MD;  Location: [facility] OR;  Service: Plastics;  Laterality: Left;  BREAST RECONSTRUCTION WITH PLACEMENT OF TISSUE EXPANDER TO LEFT BREAST   BREAST RECONSTRUCTION  02/06/2013   CESAREAN SECTION  [id number]   ENDOBRONCHIAL ULTRASOUND N/A 02/24/2017   Procedure: ENDOBRONCHIAL ULTRASOUND;  Surgeon: Erin Fulling, MD;  Location: [facility] ORS;  Service: Cardiopulmonary;  Laterality: N/A;   LATISSIMUS FLAP TO BREAST Right 02/06/2013   Procedure: LATISSIMUS FLAP TO RIGHT BREAST WITH IMPLANT;  Surgeon: Etter Sjogren, MD;  Location: [facility] OR;  Service: Plastics;  Laterality: Right;   MASTECTOMY  03/07/12   modified right; total left   MODIFIED MASTECTOMY  03/07/2012   Procedure: MODIFIED MASTECTOMY;  Surgeon: Currie Paris, MD;  Location: [facility] OR;  Service: General;  Laterality: Right;   PORTACATH PLACEMENT  07/2011   SCAR REVISION  03/30/2012   Procedure: SCAR REVISION;  Surgeon: Currie Paris, MD;  Location: [facility];  Service: General;  Laterality: Right;  CLOSURE OF MASTECTOMY INCISION   WISDOM TOOTH EXTRACTION      FAMILY HISTORY :   Family History  Problem Relation Age of Onset   Breast cancer Mother    Diabetes Father     SOCIAL  HISTORY:   Social History   Tobacco Use   Smoking status: Never   Smokeless tobacco: Never  Vaping Use   Vaping Use: Never used  Substance Use Topics   Alcohol use: No   Drug use: No    ALLERGIES:  is allergic to sulfa antibiotics.  MEDICATIONS:  Current Outpatient Medications  Medication Sig Dispense Refill   abemaciclib (VERZENIO) 100 MG tablet TAKE 1 TABLET BY MOUTH TWICE DAILY. DO NOT CHEW, CRUSH, OR SPLIT TABLETS BEFORE SWALLOWING 56 tablet 6   anastrozole (ARIMIDEX) 1 MG tablet Take 1 tablet (1 mg total) by mouth daily. 30 tablet 4   aspirin EC 81 MG tablet Take 81 mg by mouth daily.     atorvastatin (LIPITOR) 20 MG tablet Take 1 tablet (20 mg total) by mouth daily. 90 tablet 3   Calcium Carb-Cholecalciferol 500-10 MG-MCG TABS Take by mouth.     Cholecalciferol 50 MCG (2000 UT) CAPS Take 3 capsules daily for 3 months, then reduce to 1 capsule daily thereafter for Vitamin D Deficiency.     ciprofloxacin (CIPRO) 500 MG tablet Take 1 tablet (500 mg total) by mouth 2 (two) times daily. 6 tablet 0   clotrimazole-betamethasone (LOTRISONE) cream SMARTSIG:Topical Morning-Evening     Cranberry-Vitamin C-Probiotic (AZO CRANBERRY PO) Take 1 Capful by mouth daily at 12 noon.  cyanocobalamin (VITAMIN B12) 1000 MCG tablet 1,000 mcg daily. Take 2 tablets daily for 2 weeks, then reduce to 1 tablet daily thereafter for Vitamin B12 Deficiency.     glipiZIDE (GLUCOTROL XL) 10 MG 24 hr tablet Take 1 tablet (10 mg total) by mouth 2 (two) times daily. 180 tablet 3   insulin aspart (NOVOLOG FLEXPEN) 100 UNIT/ML FlexPen Inject 4 Units into the skin 3 (three) times daily with meals. 15 mL 0   Insulin Detemir (LEVEMIR) 100 UNIT/ML Pen Inject 20 Units into the skin at bedtime. 15 mL 0   levothyroxine (SYNTHROID) 25 MCG tablet Take 1 tablet (25 mcg total) by mouth daily before breakfast. 90 tablet 1   lisinopril (ZESTRIL) 10 MG tablet Take 10 mg by mouth daily.     magnesium oxide (MAG-OX) 400 MG  tablet Take 1 tablet by mouth daily.     Melatonin 10 MG TABS Take 1 tablet by mouth at bedtime as needed.     ondansetron (ZOFRAN-ODT) 4 MG disintegrating tablet Take 1 tablet (4 mg total) by mouth every 8 (eight) hours as needed for nausea or vomiting. 30 tablet 3   phenazopyridine (PYRIDIUM) 95 MG tablet Take 95 mg by mouth 3 (three) times daily as needed for pain.     TRULICITY 0.75 MG/0.5ML SOPN SMARTSIG:0.5 Milliliter(s) SUB-Q Once a Week     gabapentin (NEURONTIN) 300 MG capsule Take 300 mg by mouth at bedtime.     loperamide (IMODIUM A-D) 2 MG tablet Take 2 mg by mouth 4 (four) times daily as needed for diarrhea or loose stools. (Patient not taking: Reported on 11/17/2022)     No current facility-administered medications for this visit.    PHYSICAL EXAMINATION: ECOG PERFORMANCE STATUS: 1 - Symptomatic but completely ambulatory  BP 121/73 (Patient Position: Sitting)   Pulse 100   Temp 98.6 F (37 C)   Resp 17   Wt 159 lb 3.2 oz (72.2 kg)   SpO2 100%   BMI 28.65 kg/m   Filed Weights   12/15/22 0919  Weight: 159 lb 3.2 oz (72.2 kg)       Physical Exam Constitutional:      Comments: She is alone.   HENT:     Head: Normocephalic and atraumatic.     Mouth/Throat:     Pharynx: No oropharyngeal exudate.  Eyes:     Pupils: Pupils are equal, round, and reactive to light.  Cardiovascular:     Rate and Rhythm: Normal rate and regular rhythm.  Pulmonary:     Effort: Pulmonary effort is normal. No respiratory distress.     Breath sounds: Normal breath sounds. No wheezing.  Abdominal:     General: Bowel sounds are normal. There is no distension.     Palpations: Abdomen is soft. There is no mass.     Tenderness: There is no abdominal tenderness. There is no guarding or rebound.  Musculoskeletal:        General: No tenderness. Normal range of motion.     Cervical back: Normal range of motion and neck supple.     Comments: Approximately 1 cm hard nodule felt in the anterior  chest wall/left parasternal.  Skin:    General: Skin is warm.  Neurological:     Mental Status: She is alert and oriented to person, place, and time.  Psychiatric:        Mood and Affect: Affect normal.    Maculopapular rash mostly on the left lower extremity-mild on left lower; bilateral  upper extremities  LABORATORY DATA:  I have reviewed the data as listed    Component Value Date/Time   NA 131 (L) 12/15/2022 0901   NA 135 11/28/2014 1401   K 3.8 12/15/2022 0901   K 4.7 11/28/2014 1401   CL 101 12/15/2022 0901   CL 99 (L) 11/28/2014 1401   CO2 23 12/15/2022 0901   CO2 28 11/28/2014 1401   GLUCOSE 224 (H) 12/15/2022 0901   GLUCOSE 215 (H) 11/28/2014 1401   BUN 20 12/15/2022 0901   BUN 16 11/28/2014 1401   CREATININE 1.29 (H) 12/15/2022 0901   CREATININE 0.96 11/28/2014 1401   CALCIUM 9.6 12/15/2022 0901   CALCIUM 9.9 11/28/2014 1401   PROT 6.3 (L) 12/15/2022 0901   PROT 7.6 11/28/2014 1401   ALBUMIN 3.2 (L) 12/15/2022 0901   ALBUMIN 4.5 11/28/2014 1401   AST 40 12/15/2022 0901   ALT 27 12/15/2022 0901   ALT 28 11/28/2014 1401   ALKPHOS 89 12/15/2022 0901   ALKPHOS 78 11/28/2014 1401   BILITOT 0.6 12/15/2022 0901   GFRNONAA 45 (L) 12/15/2022 0901   GFRNONAA >60 11/28/2014 1401   GFRAA >60 05/30/2020 1022   GFRAA >60 11/28/2014 1401    No results found for: "SPEP", "UPEP"  Lab Results  Component Value Date   WBC 2.7 (L) 12/15/2022   NEUTROABS 1.0 (L) 12/15/2022   HGB 9.9 (L) 12/15/2022   HCT 29.1 (L) 12/15/2022   MCV 100.3 (H) 12/15/2022   PLT 186 12/15/2022      Chemistry      Component Value Date/Time   NA 131 (L) 12/15/2022 0901   NA 135 11/28/2014 1401   K 3.8 12/15/2022 0901   K 4.7 11/28/2014 1401   CL 101 12/15/2022 0901   CL 99 (L) 11/28/2014 1401   CO2 23 12/15/2022 0901   CO2 28 11/28/2014 1401   BUN 20 12/15/2022 0901   BUN 16 11/28/2014 1401   CREATININE 1.29 (H) 12/15/2022 0901   CREATININE 0.96 11/28/2014 1401      Component  Value Date/Time   CALCIUM 9.6 12/15/2022 0901   CALCIUM 9.9 11/28/2014 1401   ALKPHOS 89 12/15/2022 0901   ALKPHOS 78 11/28/2014 1401   AST 40 12/15/2022 0901   ALT 27 12/15/2022 0901   ALT 28 11/28/2014 1401   BILITOT 0.6 12/15/2022 0901       RADIOGRAPHIC STUDIES: I have personally reviewed the radiological images as listed and agreed with the findings in the report. No results found.   ASSESSMENT & PLAN:  Carcinoma of lower-outer quadrant of right breast in female, estrogen receptor positive (HCC)  # Recurrent breast cancer-ER positive PR negative ? HER-2/neu-currently on abema+ Anastrozole. JAN 2024-Discontinued faslodex sec to insurance issues.]  MARCH 15th, 2023- CT CAP-  No definitive evidence to suggest recurrent or metastatic disease in the chest, abdomen or pelvis. Stable.  #  Currently on  Abema 100 BID; Labs today-ANC- 1.0  Hb-10-11; platelets-N-   continue Anastrazole [JAN Mid 2024]; recommend break on week ends given prior ANC < 0.1. stable.  # Bilateral itchy watery eyes and sneezing. No improvement from Montelukast- stable.   # Hyponatremia [131]/CKD stage III- sec to poor hydration/diarrhea- ? abema- recommend increasing fluids/salt intake electrolytes. stable.   # Hypomagnesia: 1.6- on OTC mag-stable.   # DM- BG- 140s/p endocrinology evaluation; with diet and medications [trucilicity; Novolog]- stable.   # recurrent UTI/ ? Suprapubic tenderness- S/p eval with urology.  Monitor closely on Abema/with neutropenia-  stable.; will refer to Consuello Masse re: vaginal atrophy  # intermittent Hypercalcemia: Ca 10.4-resolved- continue just vit D- monitor for now. FEB Vit D - 90.   # Low B 12 [PCP]- on B12 PO. stable.   # IV access; PIV.   #DISPOSITION: **early AM appt  #refer to Consuello Masse re: vaginal atrophy on 4 weeks- Thursday  # Follow up in 4 weeks- Thursday -MD/ labs [CBC/CMP/b12; mag; Ca 27-29;] Dr.B     No orders of the defined types were placed in this  encounter.     All questions were answered. The patient knows to call the clinic with any problems, questions or concerns.      Earna Coder, MD 12/15/2022 10:07 AM

## 2022-12-15 NOTE — Progress Notes (Signed)
Patient here for oncology follow-up appointment, expresses no new concerns at this time.    

## 2022-12-15 NOTE — Assessment & Plan Note (Addendum)
#   Recurrent breast cancer-ER positive PR negative ? HER-2/neu-currently on abema+ Anastrozole. JAN 2024-Discontinued faslodex sec to insurance issues.]  MARCH 15th, 2023- CT CAP-  No definitive evidence to suggest recurrent or metastatic disease in the chest, abdomen or pelvis. Stable.  #  Currently on  Abema 100 BID; Labs today-ANC- 1.0  Hb-10-11; platelets-N-   continue Anastrazole [JAN Mid 2024]; recommend break on week ends given prior ANC < 0.1. stable.  # Bilateral itchy watery eyes and sneezing. No improvement from Montelukast- stable.   # Hyponatremia [131]/CKD stage III- sec to poor hydration/diarrhea- ? abema- recommend increasing fluids/salt intake electrolytes. stable.   # Hypomagnesia: 1.6- on OTC mag-stable.   # DM- BG- 140s/p endocrinology evaluation; with diet and medications [trucilicity; Novolog]- stable.   # recurrent UTI/ ? Suprapubic tenderness- S/p eval with urology.  Monitor closely on Abema/with neutropenia- stable.; will refer to Consuello Masse re: vaginal atrophy  # intermittent Hypercalcemia: Ca 10.4-resolved- continue just vit D- monitor for now. FEB Vit D - 90.   # Low B 12 [PCP]- on B12 PO. stable.   # IV access; PIV.   #DISPOSITION: **early AM appt  #refer to Consuello Masse re: vaginal atrophy on 4 weeks- Thursday  # Follow up in 4 weeks- Thursday -MD/ labs [CBC/CMP/b12; mag; Ca 27-29;] Dr.B

## 2022-12-16 LAB — CANCER ANTIGEN 27.29: CA 27.29: 24.8 U/mL (ref 0.0–38.6)

## 2022-12-21 ENCOUNTER — Other Ambulatory Visit: Payer: Self-pay | Admitting: Internal Medicine

## 2023-01-12 ENCOUNTER — Telehealth: Payer: Self-pay | Admitting: *Deleted

## 2023-01-12 ENCOUNTER — Encounter: Payer: Self-pay | Admitting: Hospice and Palliative Medicine

## 2023-01-12 ENCOUNTER — Ambulatory Visit
Admission: RE | Admit: 2023-01-12 | Discharge: 2023-01-12 | Disposition: A | Discharge status: Home / Self Care | Payer: PPO | Source: Ambulatory Visit | Attending: Hospice and Palliative Medicine | Admitting: Hospice and Palliative Medicine

## 2023-01-12 ENCOUNTER — Inpatient Hospital Stay: Payer: PPO

## 2023-01-12 ENCOUNTER — Ambulatory Visit
Admission: RE | Admit: 2023-01-12 | Discharge: 2023-01-12 | Disposition: A | Discharge status: Home / Self Care | Payer: PPO | Attending: Hospice and Palliative Medicine | Admitting: Hospice and Palliative Medicine

## 2023-01-12 ENCOUNTER — Inpatient Hospital Stay: Payer: PPO | Attending: Internal Medicine

## 2023-01-12 ENCOUNTER — Inpatient Hospital Stay (HOSPITAL_BASED_OUTPATIENT_CLINIC_OR_DEPARTMENT_OTHER): Payer: PPO | Admitting: Hospice and Palliative Medicine

## 2023-01-12 ENCOUNTER — Other Ambulatory Visit: Payer: Self-pay

## 2023-01-12 VITALS — BP 120/75 | HR 97 | Temp 98.0°F | Resp 22

## 2023-01-12 DIAGNOSIS — R051 Acute cough: Secondary | ICD-10-CM | POA: Insufficient documentation

## 2023-01-12 DIAGNOSIS — R197 Diarrhea, unspecified: Secondary | ICD-10-CM | POA: Insufficient documentation

## 2023-01-12 DIAGNOSIS — R110 Nausea: Secondary | ICD-10-CM | POA: Diagnosis not present

## 2023-01-12 DIAGNOSIS — E1122 Type 2 diabetes mellitus with diabetic chronic kidney disease: Secondary | ICD-10-CM | POA: Insufficient documentation

## 2023-01-12 DIAGNOSIS — C7800 Secondary malignant neoplasm of unspecified lung: Secondary | ICD-10-CM | POA: Diagnosis not present

## 2023-01-12 DIAGNOSIS — R0602 Shortness of breath: Secondary | ICD-10-CM | POA: Insufficient documentation

## 2023-01-12 DIAGNOSIS — Z7982 Long term (current) use of aspirin: Secondary | ICD-10-CM | POA: Diagnosis not present

## 2023-01-12 DIAGNOSIS — I129 Hypertensive chronic kidney disease with stage 1 through stage 4 chronic kidney disease, or unspecified chronic kidney disease: Secondary | ICD-10-CM | POA: Insufficient documentation

## 2023-01-12 DIAGNOSIS — R600 Localized edema: Secondary | ICD-10-CM

## 2023-01-12 DIAGNOSIS — Z7901 Long term (current) use of anticoagulants: Secondary | ICD-10-CM | POA: Insufficient documentation

## 2023-01-12 DIAGNOSIS — E871 Hypo-osmolality and hyponatremia: Secondary | ICD-10-CM | POA: Diagnosis not present

## 2023-01-12 DIAGNOSIS — Z1383 Encounter for screening for respiratory disorder NEC: Secondary | ICD-10-CM | POA: Insufficient documentation

## 2023-01-12 DIAGNOSIS — C50511 Malignant neoplasm of lower-outer quadrant of right female breast: Secondary | ICD-10-CM | POA: Diagnosis not present

## 2023-01-12 DIAGNOSIS — Z803 Family history of malignant neoplasm of breast: Secondary | ICD-10-CM | POA: Insufficient documentation

## 2023-01-12 DIAGNOSIS — Z882 Allergy status to sulfonamides status: Secondary | ICD-10-CM | POA: Insufficient documentation

## 2023-01-12 DIAGNOSIS — R509 Fever, unspecified: Secondary | ICD-10-CM | POA: Diagnosis not present

## 2023-01-12 DIAGNOSIS — Z86718 Personal history of other venous thrombosis and embolism: Secondary | ICD-10-CM | POA: Insufficient documentation

## 2023-01-12 DIAGNOSIS — Z9011 Acquired absence of right breast and nipple: Secondary | ICD-10-CM | POA: Insufficient documentation

## 2023-01-12 DIAGNOSIS — Z7985 Long-term (current) use of injectable non-insulin antidiabetic drugs: Secondary | ICD-10-CM | POA: Insufficient documentation

## 2023-01-12 DIAGNOSIS — J209 Acute bronchitis, unspecified: Secondary | ICD-10-CM | POA: Diagnosis not present

## 2023-01-12 DIAGNOSIS — R062 Wheezing: Secondary | ICD-10-CM | POA: Diagnosis not present

## 2023-01-12 DIAGNOSIS — N183 Chronic kidney disease, stage 3 unspecified: Secondary | ICD-10-CM | POA: Diagnosis not present

## 2023-01-12 DIAGNOSIS — E876 Hypokalemia: Secondary | ICD-10-CM | POA: Insufficient documentation

## 2023-01-12 DIAGNOSIS — Z79899 Other long term (current) drug therapy: Secondary | ICD-10-CM | POA: Insufficient documentation

## 2023-01-12 DIAGNOSIS — Z7984 Long term (current) use of oral hypoglycemic drugs: Secondary | ICD-10-CM | POA: Diagnosis not present

## 2023-01-12 DIAGNOSIS — R059 Cough, unspecified: Secondary | ICD-10-CM | POA: Diagnosis not present

## 2023-01-12 DIAGNOSIS — E860 Dehydration: Secondary | ICD-10-CM

## 2023-01-12 DIAGNOSIS — Z794 Long term (current) use of insulin: Secondary | ICD-10-CM | POA: Insufficient documentation

## 2023-01-12 DIAGNOSIS — Z923 Personal history of irradiation: Secondary | ICD-10-CM | POA: Insufficient documentation

## 2023-01-12 DIAGNOSIS — Z79811 Long term (current) use of aromatase inhibitors: Secondary | ICD-10-CM | POA: Diagnosis not present

## 2023-01-12 DIAGNOSIS — Z9221 Personal history of antineoplastic chemotherapy: Secondary | ICD-10-CM | POA: Insufficient documentation

## 2023-01-12 DIAGNOSIS — Z9013 Acquired absence of bilateral breasts and nipples: Secondary | ICD-10-CM | POA: Insufficient documentation

## 2023-01-12 DIAGNOSIS — Z170 Estrogen receptor positive status [ER+]: Secondary | ICD-10-CM | POA: Insufficient documentation

## 2023-01-12 LAB — CMP (CANCER CENTER ONLY)
ALT: 24 U/L (ref 0–44)
AST: 47 U/L — ABNORMAL HIGH (ref 15–41)
Albumin: 3.3 g/dL — ABNORMAL LOW (ref 3.5–5.0)
Alkaline Phosphatase: 67 U/L (ref 38–126)
Anion gap: 14 (ref 5–15)
BUN: 26 mg/dL — ABNORMAL HIGH (ref 8–23)
CO2: 21 mmol/L — ABNORMAL LOW (ref 22–32)
Calcium: 8.8 mg/dL — ABNORMAL LOW (ref 8.9–10.3)
Chloride: 95 mmol/L — ABNORMAL LOW (ref 98–111)
Creatinine: 1.4 mg/dL — ABNORMAL HIGH (ref 0.44–1.00)
GFR, Estimated: 41 mL/min — ABNORMAL LOW (ref >60)
Glucose, Bld: 305 mg/dL — ABNORMAL HIGH (ref 70–99)
Potassium: 3 mmol/L — ABNORMAL LOW (ref 3.5–5.1)
Sodium: 130 mmol/L — ABNORMAL LOW (ref 135–145)
Total Bilirubin: 0.9 mg/dL (ref 0.3–1.2)
Total Protein: 7.2 g/dL (ref 6.5–8.1)

## 2023-01-12 LAB — VITAMIN B12: Vitamin B-12: 1286 pg/mL — ABNORMAL HIGH (ref 180–914)

## 2023-01-12 LAB — RESPIRATORY PANEL BY PCR

## 2023-01-12 LAB — CBC WITH DIFFERENTIAL (CANCER CENTER ONLY)
Abs Immature Granulocytes: 0.02 10*3/uL (ref 0.00–0.07)
Basophils Absolute: 0 10*3/uL (ref 0.0–0.1)
Basophils Relative: 1 %
Eosinophils Absolute: 0 10*3/uL (ref 0.0–0.5)
Eosinophils Relative: 0 %
HCT: 29.6 % — ABNORMAL LOW (ref 36.0–46.0)
Hemoglobin: 10.3 g/dL — ABNORMAL LOW (ref 12.0–15.0)
Immature Granulocytes: 0 %
Lymphocytes Relative: 16 %
Lymphs Abs: 0.8 10*3/uL (ref 0.7–4.0)
MCH: 33.9 pg (ref 26.0–34.0)
MCHC: 34.8 g/dL (ref 30.0–36.0)
MCV: 97.4 fL (ref 80.0–100.0)
Monocytes Absolute: 0.2 10*3/uL (ref 0.1–1.0)
Monocytes Relative: 3 %
Neutro Abs: 3.9 10*3/uL (ref 1.7–7.7)
Neutrophils Relative %: 80 %
Platelet Count: 245 10*3/uL (ref 150–400)
RBC: 3.04 MIL/uL — ABNORMAL LOW (ref 3.87–5.11)
RDW: 13.6 % (ref 11.5–15.5)
Smear Review: NORMAL
WBC Count: 5 10*3/uL (ref 4.0–10.5)
nRBC: 0 % (ref 0.0–0.2)

## 2023-01-12 LAB — MAGNESIUM: Magnesium: 1.4 mg/dL — ABNORMAL LOW (ref 1.7–2.4)

## 2023-01-12 MED ORDER — AMOXICILLIN-POT CLAVULANATE 875-125 MG PO TABS: 1.0000 | ORAL_TABLET | Freq: Two times a day (BID) | ORAL | 0 refills | Status: DC

## 2023-01-12 MED ORDER — POTASSIUM CHLORIDE CRYS ER 10 MEQ PO TBCR: 10.0000 meq | EXTENDED_RELEASE_TABLET | Freq: Every day | ORAL | 0 refills | Status: AC

## 2023-01-12 MED ORDER — ALBUTEROL SULFATE HFA 108 (90 BASE) MCG/ACT IN AERS: 1.0000 | INHALATION_SPRAY | Freq: Four times a day (QID) | RESPIRATORY_TRACT | 2 refills | Status: DC | PRN

## 2023-01-12 MED ORDER — SODIUM CHLORIDE 0.9 % IV SOLN: 20.0000 meq | Freq: Once | INTRAVENOUS | Status: DC

## 2023-01-12 MED ORDER — MAGNESIUM SULFATE 2 GM/50ML IV SOLN
2.0000 g | Freq: Once | INTRAVENOUS | Status: AC
  Administered 2023-01-12: 2 g via INTRAVENOUS
  Filled 2023-01-12: qty 50

## 2023-01-12 MED ORDER — SODIUM CHLORIDE 0.9 % IV SOLN
INTRAVENOUS | Status: DC
  Filled 2023-01-12: qty 250

## 2023-01-12 MED ORDER — POTASSIUM CHLORIDE IN NACL 20-0.9 MEQ/L-% IV SOLN
INTRAVENOUS | Status: DC
  Filled 2023-01-12 (×2): qty 1000

## 2023-01-12 NOTE — Telephone Encounter (Signed)
Received a message from answering service that patient called reporting cough, fever, and nausea, she is requesting an appointment to be seen. I called patient fro more information, but it went to voice mail to call me back for more information

## 2023-01-12 NOTE — Progress Notes (Signed)
[facility] at [facility] [phone] [phone]) [phone]  Patient Care Team: Mickey Farber, MD as PCP - General (Internal Medicine) Cyndia Bent, MD (Inactive) (General Surgery) Harold Hedge, MD (Obstetrics and Gynecology) Carmina Miller, MD Johney Maine, MD (Unknown Physician Specialty) Earna Coder, MD as Consulting Physician (Hematology and Oncology)   NAME OF PATIENT: Kimberly [name]  [id number]  09/14/1955   DATE OF VISIT: 01/12/23  REASON FOR CONSULT: Kimberly [name] is a 67 y.o. female with multiple medical problems including stage IV ER/PR/HER2 positive breast cancer diagnosed in 2016 with recurrence in 2018.  INTERVAL HISTORY: Patient is on treatment with Abema.   She presents to clinic today for evaluation of nasal congestion, cough, shortness of breath, and sore throat.  She reports that symptoms began 4 to 5 days ago and have progressed over the last 1 to 2 days.  She is also had fevers over the last 1 to 2 days with Tmax of 102.0 yesterday.  She has been taking acetaminophen.  However, she says that she has not had a fever today nor she taking any Tylenol.  She has had poor oral intake with nausea and diarrhea several days ago.  She has also noticed slight redness and swelling to her right upper extremity.  Patient says she had recent contact with a family member who is now hospitalized with pneumonia.  Denies any neurologic complaints. Denies any easy bleeding or bruising. Reports fair appetite and denies weight loss. Denies chest pain. Patient offers no further specific complaints today.   PAST MEDICAL HISTORY: Past Medical History:  Diagnosis Date   Arthritis    Cancer of lower-outer quadrant of female breast (HCC) 08/06/2011   RIGHT, chemo and bilateral mastectomies.   High cholesterol    History of kidney stones    Hx of bilateral breast implants    Hypertension    Lung  metastases 20118   chemo pills and faslidex shots.   PONV (postoperative nausea and vomiting)    Type II diabetes mellitus (HCC)    fasting 140-150   Vertigo    LAST WEEK   Vertigo 01/2017    PAST SURGICAL HISTORY:  Past Surgical History:  Procedure Laterality Date   BREAST BIOPSY  07/2011, 2020   right   BREAST RECONSTRUCTION  03/07/2012   Procedure: BREAST RECONSTRUCTION;  Surgeon: Etter Sjogren, MD;  Location: [facility] OR;  Service: Plastics;  Laterality: Left;  BREAST RECONSTRUCTION WITH PLACEMENT OF TISSUE EXPANDER TO LEFT BREAST   BREAST RECONSTRUCTION  02/06/2013   CESAREAN SECTION  [id number]   ENDOBRONCHIAL ULTRASOUND N/A 02/24/2017   Procedure: ENDOBRONCHIAL ULTRASOUND;  Surgeon: Erin Fulling, MD;  Location: [facility] ORS;  Service: Cardiopulmonary;  Laterality: N/A;   LATISSIMUS FLAP TO BREAST Right 02/06/2013   Procedure: LATISSIMUS FLAP TO RIGHT BREAST WITH IMPLANT;  Surgeon: Etter Sjogren, MD;  Location: [facility] OR;  Service: Plastics;  Laterality: Right;   MASTECTOMY  03/07/12   modified right; total left   MODIFIED MASTECTOMY  03/07/2012   Procedure: MODIFIED MASTECTOMY;  Surgeon: Currie Paris, MD;  Location: [facility] OR;  Service: General;  Laterality: Right;   PORTACATH PLACEMENT  07/2011   SCAR REVISION  03/30/2012   Procedure: SCAR REVISION;  Surgeon: Currie Paris, MD;  Location: [facility];  Service: General;  Laterality: Right;  CLOSURE OF MASTECTOMY INCISION   WISDOM TOOTH EXTRACTION      HEMATOLOGY/ONCOLOGY HISTORY:  Oncology History Overview Note  #  DEC 2012- RIGHT BREAST CA T2 N2 M0 tumor from biopsy.  Estrogen receptor positive, Progesterone receptor positive.  Current receptor negative by FISH 2. Neoadjuvant chemotherapy started in December of 28 with Cytoxan Adriamycin 3. Started on Taxol weekly chemotherapy. 4. Patient finished 12  cycles of Taxol chemotherapy in May of 2013.     5. Status post right modified radical mastectomy [Dr.Bowers; [place]] June of  2013, ypT1c  yp N2  MO. started also on Lerazole    7. Radiation therapy to the right breast (September of 2013).  Lymph node was positive for HER-2 receptor gene amplification of 2.22.  Will proceed with Herceptin treatment starting in September of 2013.   8.Patient has finished Herceptin (maintenance therapy) in August of 2014 8. Start patient on letrozole from November, 2013. 9. Patient started on Herceptin in September 2013.    10Patient finished 12 months of Herceptin therapy on August, 2014  # 6. Status post left side prophylactic mastectomy.  #Late MAY 2018-RECURRENCE BREAST CA- ER positive/PR negative; ?? HER-2/neu- [biopsy- proven-mediastinal lymph node; in suff for her 2 testing].  [elevated Tumor marker- CT/PET- uptake in Right Mediastinal LN; Sternum [June 2018 EBUS- Dr.Kasa]   # March 10 2017- faslodex + Abema; OCT 5th CT-PR of mediastinal LN [consent]; Discontinued faslodex sec to insurance issues [last DEC 2023]. JAN 2024- continue Abema + Anastrzole   #August 2020-left chest wall nodule biopsy benign  #August 2019- DVT-right upper extremity; Eliquis; stop end of March 2021 [repeat ultrasound negative/patient preference]; # JAN 2022-Cirrhosis- ? On CT scan-FEB liver- Negative fro overt cirrhosis --------------------------------------------------------------- -    DIAGNOSIS: [ BREAST CANCER- ER/PR/HER2 NEU POS  STAGE:  4   ;GOALS: PALLIATIVE     Carcinoma of lower-outer quadrant of right breast in female, estrogen receptor positive (HCC)    ALLERGIES:  is allergic to sulfa antibiotics.  MEDICATIONS:  Current Outpatient Medications  Medication Sig Dispense Refill   abemaciclib (VERZENIO) 100 MG tablet TAKE 1 TABLET BY MOUTH TWICE DAILY. DO NOT CHEW, CRUSH, OR SPLIT TABLETS BEFORE SWALLOWING 56 tablet 6   anastrozole (ARIMIDEX) 1 MG tablet TAKE 1 TABLET BY MOUTH EVERY DAY 90 tablet 2   aspirin EC 81 MG tablet Take 81 mg by mouth daily.     atorvastatin  (LIPITOR) 20 MG tablet Take 1 tablet (20 mg total) by mouth daily. 90 tablet 3   Calcium Carb-Cholecalciferol 500-10 MG-MCG TABS Take by mouth.     Cholecalciferol 50 MCG (2000 UT) CAPS Take 3 capsules daily for 3 months, then reduce to 1 capsule daily thereafter for Vitamin D Deficiency.     clotrimazole-betamethasone (LOTRISONE) cream SMARTSIG:Topical Morning-Evening     Cranberry-Vitamin C-Probiotic (AZO CRANBERRY PO) Take 1 Capful by mouth daily at 12 noon.     cyanocobalamin (VITAMIN B12) 1000 MCG tablet 1,000 mcg daily. Take 2 tablets daily for 2 weeks, then reduce to 1 tablet daily thereafter for Vitamin B12 Deficiency.     glipiZIDE (GLUCOTROL XL) 10 MG 24 hr tablet Take 1 tablet (10 mg total) by mouth 2 (two) times daily. 180 tablet 3   insulin aspart (NOVOLOG FLEXPEN) 100 UNIT/ML FlexPen Inject 4 Units into the skin 3 (three) times daily with meals. 15 mL 0   Insulin Detemir (LEVEMIR) 100 UNIT/ML Pen Inject 20 Units into the skin at bedtime. 15 mL 0   levothyroxine (SYNTHROID) 25 MCG tablet Take 1 tablet (25 mcg total) by mouth daily before breakfast. 90 tablet 1   lisinopril (ZESTRIL) 10  MG tablet Take 10 mg by mouth daily.     loperamide (IMODIUM A-D) 2 MG tablet Take 2 mg by mouth 4 (four) times daily as needed for diarrhea or loose stools.     magnesium oxide (MAG-OX) 400 MG tablet Take 1 tablet by mouth daily.     Melatonin 10 MG TABS Take 1 tablet by mouth at bedtime as needed.     ondansetron (ZOFRAN-ODT) 4 MG disintegrating tablet Take 1 tablet (4 mg total) by mouth every 8 (eight) hours as needed for nausea or vomiting. 30 tablet 3   TRULICITY 0.75 MG/0.5ML SOPN SMARTSIG:0.5 Milliliter(s) SUB-Q Once a Week     No current facility-administered medications for this visit.    VITAL SIGNS: There were no vitals taken for this visit. There were no vitals filed for this visit.  Estimated body mass index is 28.65 kg/m as calculated from the following:   Height as of 11/24/22: 5'  2.5" (1.588 m).   Weight as of 12/15/22: 159 lb 3.2 oz (72.2 kg).  LABS: CBC:    Component Value Date/Time   WBC 2.7 (L) 12/15/2022 0901   WBC 2.5 (L) 10/19/2022 0854   HGB 9.9 (L) 12/15/2022 0901   HGB 12.9 11/28/2014 1401   HCT 29.1 (L) 12/15/2022 0901   HCT 38.8 11/28/2014 1401   PLT 186 12/15/2022 0901   PLT 369 11/28/2014 1401   MCV 100.3 (H) 12/15/2022 0901   MCV 84 11/28/2014 1401   NEUTROABS 1.0 (L) 12/15/2022 0901   NEUTROABS 4.1 11/28/2014 1401   LYMPHSABS 1.5 12/15/2022 0901   LYMPHSABS 1.9 11/28/2014 1401   MONOABS 0.1 12/15/2022 0901   MONOABS 0.3 11/28/2014 1401   EOSABS 0.0 12/15/2022 0901   EOSABS 0.1 11/28/2014 1401   BASOSABS 0.1 12/15/2022 0901   BASOSABS 0.1 11/28/2014 1401   Comprehensive Metabolic Panel:    Component Value Date/Time   NA 131 (L) 12/15/2022 0901   NA 135 11/28/2014 1401   K 3.8 12/15/2022 0901   K 4.7 11/28/2014 1401   CL 101 12/15/2022 0901   CL 99 (L) 11/28/2014 1401   CO2 23 12/15/2022 0901   CO2 28 11/28/2014 1401   BUN 20 12/15/2022 0901   BUN 16 11/28/2014 1401   CREATININE 1.29 (H) 12/15/2022 0901   CREATININE 0.96 11/28/2014 1401   GLUCOSE 224 (H) 12/15/2022 0901   GLUCOSE 215 (H) 11/28/2014 1401   CALCIUM 9.6 12/15/2022 0901   CALCIUM 9.9 11/28/2014 1401   AST 40 12/15/2022 0901   ALT 27 12/15/2022 0901   ALT 28 11/28/2014 1401   ALKPHOS 89 12/15/2022 0901   ALKPHOS 78 11/28/2014 1401   BILITOT 0.6 12/15/2022 0901   PROT 6.3 (L) 12/15/2022 0901   PROT 7.6 11/28/2014 1401   ALBUMIN 3.2 (L) 12/15/2022 0901   ALBUMIN 4.5 11/28/2014 1401    RADIOGRAPHIC STUDIES: DG Chest 2 View  Result Date: 01/12/2023 CLINICAL DATA:  Shortness of breath, cough EXAM: CHEST - 2 VIEW COMPARISON:  09/06/2019 FINDINGS: Cardiac and mediastinal contours are within normal limits. No focal pulmonary opacity. No pleural effusion or pneumothorax. No acute osseous abnormality. Surgical clips in the breast bilaterally. IMPRESSION: No acute  cardiopulmonary process. Electronically Signed   By: Wiliam Ke M.D.   On: 01/12/2023 12:02    PERFORMANCE STATUS (ECOG) : 1 - Symptomatic but completely ambulatory  Review of Systems Unless otherwise noted, a complete review of systems is negative.  Physical Exam General: NAD Cardiovascular: regular rate and rhythm  Pulmonary: Expiratory wheezing, breathing unlabored Abdomen: soft, nontender, + bowel sounds GU: no suprapubic tenderness, no CVA tenderness Extremities: no edema, no joint deformities Skin: no rashes Neurological: Grossly nonfocal  IMPRESSION/PLAN: Bronchitis -chest x-ray negative for pneumonia.  However, patient is at high risk for decompensation on active chemotherapy.  Will start her empirically on antibiotics with 10-day course of Augmentin.  Start on inhalers given expiratory wheezing.  Will send for respiratory PCR panel.  Patient advised to hold her oral chemotherapy until symptoms resolved.  ED triggers reviewed.  RUE swelling -patient with history of DVT and right upper extremity in 2019 and she was anticoagulated with Eliquis for 2 years prior to discontinuing.  Patient also has history of lymphedema.  Will send for Doppler study.  Hyponatremia/hypokalemia/hypomagnesemia -likely poor oral intake/dehydration.  Will proceed with IV fluids and will replete IV K/mag.    Will bring patient back tomorrow for repeat lab/fluid clinic.  Patient will see Dr. Donneta Romberg next week.  Case and plan discussed with Dr. Donneta Romberg Patient expressed understanding and was in agreement with this plan. She also understands that She can call clinic at any time with any questions, concerns, or complaints.   Thank you for allowing me to participate in the care of this very pleasant patient.   Time Total: 25 minutes  Visit consisted of counseling and education dealing with the complex and emotionally intense issues of symptom management in the setting of serious illness.Greater than  50%  of this time was spent counseling and coordinating care related to the above assessment and plan.  Signed by: Laurette Schimke, PhD, NP-C

## 2023-01-12 NOTE — Telephone Encounter (Signed)
Patient does not have any home covid testing. I spoke with josh, np will order the covid testing when pt is evaluated today. Pt given apts for lab/smc for 1245 for lab/smc at 1. Cxr ordered-pt will obtain prior to coming to clinic.

## 2023-01-12 NOTE — Patient Instructions (Addendum)
HOLD - DO NOT TAKE- VERZENIO until instructed to start back

## 2023-01-12 NOTE — Addendum Note (Signed)
Addended by: Malachy Moan on: 01/12/2023 05:35 PM   Modules accepted: Orders

## 2023-01-12 NOTE — Telephone Encounter (Signed)
T 102.3 last night no fever now has been taking Tylenol for it lasty dose at 230 AM, Has been getting sick for past week but has worsened over past 2 - 3 days. Cough is sometimes productive, but just white color. She did have vomiting last night. She states that she is sure that she is dehydrated, She has been around her brother in  law who has pneumonia. She wants to come in to be seen. Please advise

## 2023-01-13 ENCOUNTER — Telehealth: Payer: Self-pay | Admitting: *Deleted

## 2023-01-13 ENCOUNTER — Other Ambulatory Visit: Payer: Self-pay | Admitting: *Deleted

## 2023-01-13 ENCOUNTER — Inpatient Hospital Stay: Payer: PPO

## 2023-01-13 VITALS — BP 119/57 | HR 95 | Temp 98.1°F | Resp 20

## 2023-01-13 DIAGNOSIS — C50511 Malignant neoplasm of lower-outer quadrant of right female breast: Secondary | ICD-10-CM | POA: Diagnosis not present

## 2023-01-13 DIAGNOSIS — E860 Dehydration: Secondary | ICD-10-CM

## 2023-01-13 DIAGNOSIS — E876 Hypokalemia: Secondary | ICD-10-CM

## 2023-01-13 LAB — BASIC METABOLIC PANEL - CANCER CENTER ONLY
Anion gap: 9 (ref 5–15)
BUN: 20 mg/dL (ref 8–23)
CO2: 22 mmol/L (ref 22–32)
Calcium: 8.2 mg/dL — ABNORMAL LOW (ref 8.9–10.3)
Chloride: 99 mmol/L (ref 98–111)
Creatinine: 1.12 mg/dL — ABNORMAL HIGH (ref 0.44–1.00)
GFR, Estimated: 54 mL/min — ABNORMAL LOW (ref >60)
Glucose, Bld: 242 mg/dL — ABNORMAL HIGH (ref 70–99)
Potassium: 3.2 mmol/L — ABNORMAL LOW (ref 3.5–5.1)
Sodium: 130 mmol/L — ABNORMAL LOW (ref 135–145)

## 2023-01-13 LAB — CANCER ANTIGEN 27.29: CA 27.29: 30.5 U/mL (ref 0.0–38.6)

## 2023-01-13 LAB — MAGNESIUM: Magnesium: 1.8 mg/dL (ref 1.7–2.4)

## 2023-01-13 MED ORDER — POTASSIUM CHLORIDE 10 MEQ/100ML IV SOLN
10.0000 meq | Freq: Once | INTRAVENOUS | Status: AC
  Administered 2023-01-13: 10 meq via INTRAVENOUS
  Filled 2023-01-13: qty 100

## 2023-01-13 MED ORDER — SODIUM CHLORIDE 0.9 % IV SOLN
INTRAVENOUS | Status: DC
  Filled 2023-01-13 (×2): qty 250

## 2023-01-13 NOTE — Telephone Encounter (Signed)
Contacted patient. Pt aware that she tested positive for parainfluenza. Dvt was negative on u/s. Pt still feels like she needs IV fluids. We will recheck her labs and proceed with possible iv fluids today as planned. Josh, NP agrees iv fluids are still needed as pt has poor po intake.  Pt will proceed with iv fluids in isolation/exam rm.

## 2023-01-14 ENCOUNTER — Inpatient Hospital Stay: Payer: PPO | Admitting: Nurse Practitioner

## 2023-01-14 ENCOUNTER — Inpatient Hospital Stay: Payer: PPO | Admitting: Internal Medicine

## 2023-01-14 ENCOUNTER — Encounter: Payer: Self-pay | Admitting: Internal Medicine

## 2023-01-14 ENCOUNTER — Inpatient Hospital Stay: Payer: PPO

## 2023-01-19 ENCOUNTER — Other Ambulatory Visit: Payer: Self-pay | Admitting: *Deleted

## 2023-01-19 DIAGNOSIS — Z170 Estrogen receptor positive status [ER+]: Secondary | ICD-10-CM

## 2023-01-19 DIAGNOSIS — E538 Deficiency of other specified B group vitamins: Secondary | ICD-10-CM

## 2023-01-20 ENCOUNTER — Inpatient Hospital Stay: Payer: PPO | Admitting: Internal Medicine

## 2023-01-20 ENCOUNTER — Inpatient Hospital Stay: Payer: PPO

## 2023-01-20 ENCOUNTER — Encounter: Payer: Self-pay | Admitting: Nurse Practitioner

## 2023-01-20 ENCOUNTER — Inpatient Hospital Stay (HOSPITAL_BASED_OUTPATIENT_CLINIC_OR_DEPARTMENT_OTHER): Payer: PPO | Admitting: Nurse Practitioner

## 2023-01-20 ENCOUNTER — Encounter: Payer: Self-pay | Admitting: Internal Medicine

## 2023-01-20 VITALS — BP 133/71 | HR 107 | Temp 96.9°F | Wt 157.0 lb

## 2023-01-20 DIAGNOSIS — C50511 Malignant neoplasm of lower-outer quadrant of right female breast: Secondary | ICD-10-CM

## 2023-01-20 DIAGNOSIS — E2839 Other primary ovarian failure: Secondary | ICD-10-CM

## 2023-01-20 DIAGNOSIS — N905 Atrophy of vulva: Secondary | ICD-10-CM

## 2023-01-20 DIAGNOSIS — E538 Deficiency of other specified B group vitamins: Secondary | ICD-10-CM

## 2023-01-20 DIAGNOSIS — Z170 Estrogen receptor positive status [ER+]: Secondary | ICD-10-CM

## 2023-01-20 DIAGNOSIS — N390 Urinary tract infection, site not specified: Secondary | ICD-10-CM

## 2023-01-20 LAB — CMP (CANCER CENTER ONLY)
ALT: 24 U/L (ref 0–44)
AST: 49 U/L — ABNORMAL HIGH (ref 15–41)
Albumin: 3.2 g/dL — ABNORMAL LOW (ref 3.5–5.0)
Alkaline Phosphatase: 103 U/L (ref 38–126)
Anion gap: 11 (ref 5–15)
BUN: 15 mg/dL (ref 8–23)
CO2: 24 mmol/L (ref 22–32)
Calcium: 10.4 mg/dL — ABNORMAL HIGH (ref 8.9–10.3)
Chloride: 97 mmol/L — ABNORMAL LOW (ref 98–111)
Creatinine: 1.22 mg/dL — ABNORMAL HIGH (ref 0.44–1.00)
GFR, Estimated: 48 mL/min — ABNORMAL LOW (ref >60)
Glucose, Bld: 330 mg/dL — ABNORMAL HIGH (ref 70–99)
Potassium: 4 mmol/L (ref 3.5–5.1)
Sodium: 132 mmol/L — ABNORMAL LOW (ref 135–145)
Total Bilirubin: 0.5 mg/dL (ref 0.3–1.2)
Total Protein: 6.9 g/dL (ref 6.5–8.1)

## 2023-01-20 LAB — CBC WITH DIFFERENTIAL (CANCER CENTER ONLY)
Abs Immature Granulocytes: 0.05 10*3/uL (ref 0.00–0.07)
Basophils Absolute: 0.1 10*3/uL (ref 0.0–0.1)
Basophils Relative: 2 %
Eosinophils Absolute: 0 10*3/uL (ref 0.0–0.5)
Eosinophils Relative: 1 %
HCT: 28.3 % — ABNORMAL LOW (ref 36.0–46.0)
Hemoglobin: 9.6 g/dL — ABNORMAL LOW (ref 12.0–15.0)
Immature Granulocytes: 2 %
Lymphocytes Relative: 37 %
Lymphs Abs: 1.2 10*3/uL (ref 0.7–4.0)
MCH: 33.7 pg (ref 26.0–34.0)
MCHC: 33.9 g/dL (ref 30.0–36.0)
MCV: 99.3 fL (ref 80.0–100.0)
Monocytes Absolute: 0.4 10*3/uL (ref 0.1–1.0)
Monocytes Relative: 11 %
Neutro Abs: 1.6 10*3/uL — ABNORMAL LOW (ref 1.7–7.7)
Neutrophils Relative %: 47 %
Platelet Count: 257 10*3/uL (ref 150–400)
RBC: 2.85 MIL/uL — ABNORMAL LOW (ref 3.87–5.11)
RDW: 13.7 % (ref 11.5–15.5)
WBC Count: 3.3 10*3/uL — ABNORMAL LOW (ref 4.0–10.5)
nRBC: 0 % (ref 0.0–0.2)

## 2023-01-20 LAB — VITAMIN B12: Vitamin B-12: 639 pg/mL (ref 180–914)

## 2023-01-20 LAB — MAGNESIUM: Magnesium: 1.1 mg/dL — ABNORMAL LOW (ref 1.7–2.4)

## 2023-01-20 NOTE — Progress Notes (Signed)
Symptom Management Clinic  [facility] at [facility] Department of the [place]. [facility] [address] [place], [place] [address] [phone] (phone) [phone] (fax)  Patient Care Team: Luciana Axe, NP as PCP - General (Family Medicine) Cyndia Bent, MD (Inactive) (General Surgery) Harold Hedge, MD (Obstetrics and Gynecology) Carmina Miller, MD Johney Maine, MD (Unknown Physician Specialty) Earna Coder, MD as Consulting Physician (Hematology and Oncology)   Name of the patient: Kimberly [name]  [id number]  09-19-1955   Date of visit: 01/20/23  Diagnosis- Breast Cancer  Chief complaint/ Reason for visit- Recurrent UTI & Vulvar atrophy  Heme/Onc history:  Oncology History Overview Note  # DEC 2012- RIGHT BREAST CA T2 N2 M0 tumor from biopsy.  Estrogen receptor positive, Progesterone receptor positive.  Current receptor negative by FISH 2. Neoadjuvant chemotherapy started in December of 28 with Cytoxan Adriamycin 3. Started on Taxol weekly chemotherapy. 4. Patient finished 12  cycles of Taxol chemotherapy in May of 2013.     5. Status post right modified radical mastectomy [Dr.Bowers; [place]] June of 2013, ypT1c  yp N2  MO. started also on Lerazole    7. Radiation therapy to the right breast (September of 2013).  Lymph node was positive for HER-2 receptor gene amplification of 2.22.  Will proceed with Herceptin treatment starting in September of 2013.   8.Patient has finished Herceptin (maintenance therapy) in August of 2014 8. Start patient on letrozole from November, 2013. 9. Patient started on Herceptin in September 2013.    10Patient finished 12 months of Herceptin therapy on August, 2014  # 6. Status post left side prophylactic mastectomy.  #Late MAY 2018-RECURRENCE BREAST CA- ER positive/PR negative; ?? HER-2/neu- [biopsy- proven-mediastinal lymph node; in suff for her 2 testing].   [elevated Tumor marker- CT/PET- uptake in Right Mediastinal LN; Sternum [June 2018 EBUS- Dr.Kasa]   # March 10 2017- faslodex + Abema; OCT 5th CT-PR of mediastinal LN [consent]; Discontinued faslodex sec to insurance issues [last DEC 2023]. JAN 2024- continue Abema + Anastrzole   #August 2020-left chest wall nodule biopsy benign  #August 2019- DVT-right upper extremity; Eliquis; stop end of March 2021 [repeat ultrasound negative/patient preference]; # JAN 2022-Cirrhosis- ? On CT scan-FEB liver- Negative fro overt cirrhosis --------------------------------------------------------------- -    DIAGNOSIS: [ BREAST CANCER- ER/PR/HER2 NEU POS  STAGE:  4   ;GOALS: PALLIATIVE     Carcinoma of lower-outer quadrant of right breast in female, estrogen receptor positive (HCC)    Interval history- Patient is 67 year old female who presents to symptom management clinic for complaints of vulvar atrophy and frequent utis. She has above history of breast cancer. Has complaints of suprapubic tenderness at times. She has seen urology. Has not seen gynecology. Questions using estrogen topically.   Review of systems- Review of Systems  Constitutional:  Negative for chills, fever, malaise/fatigue and weight loss.  HENT:  Negative for hearing loss, nosebleeds, sore throat and tinnitus.   Eyes:  Negative for blurred vision and double vision.  Respiratory:  Negative for cough, hemoptysis, shortness of breath and wheezing.   Cardiovascular:  Negative for chest pain, palpitations and leg swelling.  Gastrointestinal:  Negative for abdominal pain, blood in stool, constipation, diarrhea, melena, nausea and vomiting.  Genitourinary:  Negative for dysuria and urgency.  Musculoskeletal:  Negative for back pain, falls, joint pain and myalgias.  Skin:  Negative for itching and rash.  Neurological:  Negative for dizziness, tingling, sensory  change, loss of consciousness, weakness and headaches.   Endo/Heme/Allergies:  Negative for environmental allergies. Does not bruise/bleed easily.  Psychiatric/Behavioral:  Negative for depression. The patient is not nervous/anxious and does not have insomnia.      Current treatment- abemaciclib + anastrozole  Allergies  Allergen Reactions   Sulfa Antibiotics Rash    Past Medical History:  Diagnosis Date   Arthritis    Cancer of lower-outer quadrant of female breast (HCC) 08/06/2011   RIGHT, chemo and bilateral mastectomies.   High cholesterol    History of kidney stones    Hx of bilateral breast implants    Hypertension    Lung metastases 20118   chemo pills and faslidex shots.   PONV (postoperative nausea and vomiting)    Type II diabetes mellitus (HCC)    fasting 140-150   Vertigo    LAST WEEK   Vertigo 01/2017    Past Surgical History:  Procedure Laterality Date   BREAST BIOPSY  07/2011, 2020   right   BREAST RECONSTRUCTION  03/07/2012   Procedure: BREAST RECONSTRUCTION;  Surgeon: Etter Sjogren, MD;  Location: [facility] OR;  Service: Plastics;  Laterality: Left;  BREAST RECONSTRUCTION WITH PLACEMENT OF TISSUE EXPANDER TO LEFT BREAST   BREAST RECONSTRUCTION  02/06/2013   CESAREAN SECTION  [id number]   ENDOBRONCHIAL ULTRASOUND N/A 02/24/2017   Procedure: ENDOBRONCHIAL ULTRASOUND;  Surgeon: Erin Fulling, MD;  Location: [facility] ORS;  Service: Cardiopulmonary;  Laterality: N/A;   LATISSIMUS FLAP TO BREAST Right 02/06/2013   Procedure: LATISSIMUS FLAP TO RIGHT BREAST WITH IMPLANT;  Surgeon: Etter Sjogren, MD;  Location: [facility] OR;  Service: Plastics;  Laterality: Right;   MASTECTOMY  03/07/12   modified right; total left   MODIFIED MASTECTOMY  03/07/2012   Procedure: MODIFIED MASTECTOMY;  Surgeon: Currie Paris, MD;  Location: [facility] OR;  Service: General;  Laterality: Right;   PORTACATH PLACEMENT  07/2011   SCAR REVISION  03/30/2012   Procedure: SCAR REVISION;  Surgeon: Currie Paris, MD;  Location: [facility];  Service: General;   Laterality: Right;  CLOSURE OF MASTECTOMY INCISION   WISDOM TOOTH EXTRACTION      Social History   Socioeconomic History   Marital status: Widowed    Spouse name: Not on file   Number of children: Not on file   Years of education: Not on file   Highest education level: Not on file  Occupational History   Not on file  Tobacco Use   Smoking status: Never   Smokeless tobacco: Never  Vaping Use   Vaping Use: Never used  Substance and Sexual Activity   Alcohol use: No   Drug use: No   Sexual activity: Yes  Other Topics Concern   Not on file  Social History Narrative   Not on file   Social Determinants of Health   Financial Resource Strain: Not on file  Food Insecurity: Not on file  Transportation Needs: Not on file  Physical Activity: Not on file  Stress: Not on file  Social Connections: Not on file  Intimate Partner Violence: Not on file    Family History  Problem Relation Age of Onset   Breast cancer Mother    Diabetes Father      Current Outpatient Medications:    abemaciclib (VERZENIO) 100 MG tablet, TAKE 1 TABLET BY MOUTH TWICE DAILY. DO NOT CHEW, CRUSH, OR SPLIT TABLETS BEFORE SWALLOWING, Disp: 56 tablet, Rfl: 6   albuterol (VENTOLIN HFA) 108 (90 Base) MCG/ACT inhaler,  Inhale 1-2 puffs into the lungs every 6 (six) hours as needed for wheezing or shortness of breath., Disp: 8 g, Rfl: 2   amoxicillin-clavulanate (AUGMENTIN) 875-125 MG tablet, Take 1 tablet by mouth 2 (two) times daily., Disp: 20 tablet, Rfl: 0   anastrozole (ARIMIDEX) 1 MG tablet, TAKE 1 TABLET BY MOUTH EVERY DAY, Disp: 90 tablet, Rfl: 2   aspirin EC 81 MG tablet, Take 81 mg by mouth daily., Disp: , Rfl:    atorvastatin (LIPITOR) 20 MG tablet, Take 1 tablet (20 mg total) by mouth daily., Disp: 90 tablet, Rfl: 3   Calcium Carb-Cholecalciferol 500-10 MG-MCG TABS, Take by mouth., Disp: , Rfl:    Cholecalciferol 50 MCG (2000 UT) CAPS, Take 3 capsules daily for 3 months, then reduce to 1 capsule daily  thereafter for Vitamin D Deficiency., Disp: , Rfl:    clotrimazole-betamethasone (LOTRISONE) cream, SMARTSIG:Topical Morning-Evening, Disp: , Rfl:    Cranberry-Vitamin C-Probiotic (AZO CRANBERRY PO), Take 1 Capful by mouth daily at 12 noon., Disp: , Rfl:    cyanocobalamin (VITAMIN B12) 1000 MCG tablet, 1,000 mcg daily. Take 2 tablets daily for 2 weeks, then reduce to 1 tablet daily thereafter for Vitamin B12 Deficiency., Disp: , Rfl:    glipiZIDE (GLUCOTROL XL) 10 MG 24 hr tablet, Take 1 tablet (10 mg total) by mouth 2 (two) times daily., Disp: 180 tablet, Rfl: 3   insulin aspart (NOVOLOG FLEXPEN) 100 UNIT/ML FlexPen, Inject 4 Units into the skin 3 (three) times daily with meals., Disp: 15 mL, Rfl: 0   Insulin Detemir (LEVEMIR) 100 UNIT/ML Pen, Inject 20 Units into the skin at bedtime., Disp: 15 mL, Rfl: 0   levothyroxine (SYNTHROID) 25 MCG tablet, Take 1 tablet (25 mcg total) by mouth daily before breakfast., Disp: 90 tablet, Rfl: 1   lisinopril (ZESTRIL) 10 MG tablet, Take 10 mg by mouth daily., Disp: , Rfl:    loperamide (IMODIUM A-D) 2 MG tablet, Take 2 mg by mouth 4 (four) times daily as needed for diarrhea or loose stools., Disp: , Rfl:    magnesium oxide (MAG-OX) 400 MG tablet, Take 1 tablet by mouth daily., Disp: , Rfl:    Melatonin 10 MG TABS, Take 1 tablet by mouth at bedtime as needed., Disp: , Rfl:    ondansetron (ZOFRAN-ODT) 4 MG disintegrating tablet, Take 1 tablet (4 mg total) by mouth every 8 (eight) hours as needed for nausea or vomiting., Disp: 30 tablet, Rfl: 3   potassium chloride (KLOR-CON M) 10 MEQ tablet, Take 1 tablet (10 mEq total) by mouth daily., Disp: 7 tablet, Rfl: 0   TRULICITY 0.75 MG/0.5ML SOPN, SMARTSIG:0.5 Milliliter(s) SUB-Q Once a Week, Disp: , Rfl:   Physical exam:  Vitals:   01/20/23 1008  BP: 133/71  Pulse: (!) 107  Temp: (!) 96.9 F (36.1 C)  TempSrc: Tympanic  Weight: 157 lb (71.2 kg)   Physical Exam Constitutional:      General: She is not in  acute distress.    Appearance: She is well-developed. She is not ill-appearing.  HENT:     Head: Atraumatic.     Nose: Nose normal.     Mouth/Throat:     Pharynx: No oropharyngeal exudate.  Eyes:     General: No scleral icterus.    Conjunctiva/sclera: Conjunctivae normal.  Cardiovascular:     Rate and Rhythm: Normal rate and regular rhythm.  Pulmonary:     Effort: Pulmonary effort is normal.     Breath sounds: Normal breath sounds.  Abdominal:  General: Bowel sounds are normal. There is no distension.     Palpations: Abdomen is soft.     Tenderness: There is no abdominal tenderness.  Musculoskeletal:        General: No tenderness or deformity. Normal range of motion.     Cervical back: Normal range of motion and neck supple.  Skin:    General: Skin is warm and dry.     Coloration: Skin is not pale.  Neurological:     General: No focal deficit present.     Mental Status: She is alert and oriented to person, place, and time.  Psychiatric:        Mood and Affect: Mood normal.        Behavior: Behavior normal.         Latest Ref Rng & Units 01/20/2023    9:14 AM  CMP  Glucose 70 - 99 mg/dL 409   BUN 8 - 23 mg/dL 15   Creatinine 8.11 - 1.00 mg/dL 9.14   Sodium 782 - 956 mmol/L 132   Potassium 3.5 - 5.1 mmol/L 4.0   Chloride 98 - 111 mmol/L 97   CO2 22 - 32 mmol/L 24   Calcium 8.9 - 10.3 mg/dL 21.3   Total Protein 6.5 - 8.1 g/dL 6.9   Total Bilirubin 0.3 - 1.2 mg/dL 0.5   Alkaline Phos 38 - 126 U/L 103   AST 15 - 41 U/L 49   ALT 0 - 44 U/L 24       Latest Ref Rng & Units 01/20/2023    9:13 AM  CBC  WBC 4.0 - 10.5 K/uL 3.3   Hemoglobin 12.0 - 15.0 g/dL 9.6   Hematocrit 08.6 - 46.0 % 28.3   Platelets 150 - 400 K/uL 257     No images are attached to the encounter.  US Venous Img Upper Uni Right  Result Date: 01/12/2023 CLINICAL DATA:  Right upper extremity edema, erythema EXAM: RIGHT UPPER EXTREMITY VENOUS DOPPLER ULTRASOUND TECHNIQUE: Gray-scale sonography  with graded compression, as well as color Doppler and duplex ultrasound were performed to evaluate the upper extremity deep venous system from the level of the subclavian vein and including the jugular, axillary, basilic, radial, ulnar and upper cephalic vein. Spectral Doppler was utilized to evaluate flow at rest and with distal augmentation maneuvers. COMPARISON:  None Available. FINDINGS: Contralateral Subclavian Vein: Respiratory phasicity is normal and symmetric with the symptomatic side. No evidence of thrombus. Normal compressibility. Internal Jugular Vein: No evidence of thrombus. Normal compressibility, respiratory phasicity and response to augmentation. Subclavian Vein: No evidence of thrombus. Normal compressibility, respiratory phasicity and response to augmentation. Axillary Vein: No evidence of thrombus. Normal compressibility, respiratory phasicity and response to augmentation. Cephalic Vein: No evidence of thrombus. Normal compressibility, respiratory phasicity and response to augmentation. Basilic Vein: No evidence of thrombus. Normal compressibility, respiratory phasicity and response to augmentation. Brachial Veins: No evidence of thrombus. Normal compressibility, respiratory phasicity and response to augmentation. Radial Veins: No evidence of thrombus. Normal compressibility, respiratory phasicity and response to augmentation. Ulnar Veins: No evidence of thrombus. Normal compressibility, respiratory phasicity and response to augmentation. Venous Reflux:  None visualized. Other Findings:  None visualized. IMPRESSION: No evidence of DVT within the right upper extremity. Electronically Signed   By: Helyn Numbers M.D.   On: 01/12/2023 17:09   DG Chest 2 View  Result Date: 01/12/2023 CLINICAL DATA:  Shortness of breath, cough EXAM: CHEST - 2 VIEW COMPARISON:  09/06/2019 FINDINGS: Cardiac and  mediastinal contours are within normal limits. No focal pulmonary opacity. No pleural effusion or  pneumothorax. No acute osseous abnormality. Surgical clips in the breast bilaterally. IMPRESSION: No acute cardiopulmonary process. Electronically Signed   By: Wiliam Ke M.D.   On: 01/12/2023 12:02    Assessment and plan- Patient is a 67 y.o. female   Vulvar & Vaginal dryness & atrophy. We reviewed the clinical manifestations of vaginal dryness including pain, pulling, burning, tenderness. Symptoms are often related to hypoestrogen state or may occur secondary to treatments for malignancy. We reviewed the differences in vaginal moisturizers and lubricants and that patients will need both. Vaginal moisturizers are used inside the vagina, at night when patient is laying down. The gel or suppository warms and soaks into the tissue of the vagina providing long lasting moisture. Most patients use these 3 times a week but this can be adjusted up or down based on personal need. I recommend products that contain hyaluronic acid which is nonhormonal and has been shown to have similar efficacy to estrogen based creams which would be contraindicated for this patient. Vaginal lubricants are for sexual activity- lubricants make things slippery and don't absorb into the tissue. Moisturizers do absorb. We discussed the need to avoid hormonal containing or acting preparations. Water based lubricants tend to be less irritating and are compatible with condoms. Silicone lubricants are typically more slippery but harder for vagina to clear & therefore irritating. Again, coconut oil is a good option here as well though not condom friendly.   Vaginal estrogen has less systemic absorption compared to oral preparations however, still contraindicated in HR positive malignancies. ACOG has issued statement that low dose vaginal estrogen can be used after SDM visit however, newer data has contraindicated this and I personally, don't recommend particularly in the setting of her active malignancy at this point.  Atrophic  Vaginitis/Recurrent UTI/BV- I explained to the patient that after women go through menopause and their estrogen levels are diminished, normal vaginal flora change and pH in vaginal canal also changes. Because of this, the vaginal canal may be colonized by bacteria from the rectum instead of the lactobacillus, which has a protective quality. This, accompanied by the loss of the mucus barrier d/t vaginal atrophy can cause recurrent UTI and/or BV. In some studies, the use of vaginal estrogen cream has been demonstrated to reduce rate of recurrent UTIs to one a year. However, we do not recommend vaginal estrogens due to her cancer history or possibly increasing cancer risk. However, a new medication, prasterone (intrarosa) is a vaginal suppository that contains DHEA which changes vaginal pH. While this medication is not specifically used to reduced recurrent UTI, it may be helpful in relieving her symptoms. This could be an alternative for women who are hesitant in using estrogen containing products. She wishes to hold off for now and will try the topical HA products and refer to Uro-Gynecology for second opinion.   Disposition:  Ref to Uro-Gyn Return to clinic to follow up with Dr Donneta Romberg as scheduled. I can see her back in the future as needed.    Visit Diagnosis 1. Frequent UTI   2. Atrophy of vulva   3. Hypoestrogenism   4. Carcinoma of lower-outer quadrant of right breast in female, estrogen receptor positive (HCC)     Patient expressed understanding and was in agreement with this plan. She also understands that She can call clinic at any time with any questions, concerns, or complaints.   Thank you for allowing  me to participate in the care of this very pleasant patient.   Consuello Masse, DNP, AGNP-C, AOCNP [facility] [phone]  CC: Dr. Donneta Romberg

## 2023-01-20 NOTE — Progress Notes (Signed)
[name] OFFICE PROGRESS NOTE  Patient Care Team: Luciana Axe, NP as PCP - General (Family Medicine) Cyndia Bent, MD (Inactive) (General Surgery) Harold Hedge, MD (Obstetrics and Gynecology) Carmina Miller, MD Johney Maine, MD (Unknown Physician Specialty) Earna Coder, MD as Consulting Physician (Hematology and Oncology)   Cancer Staging  Carcinoma of lower-outer quadrant of right breast in female, estrogen receptor positive [facility]) Staging form: Breast, AJCC 7th Edition - Pathologic: ypT1c,ypN2a, MX - Signed by Currie Paris, MD on 03/10/2012 Cancer stage: ypT1c,ypN2a, MX    Oncology History Overview Note  # DEC 2012- RIGHT BREAST CA T2 N2 M0 tumor from biopsy.  Estrogen receptor positive, Progesterone receptor positive.  Current receptor negative by FISH 2. Neoadjuvant chemotherapy started in December of 28 with Cytoxan Adriamycin 3. Started on Taxol weekly chemotherapy. 4. Patient finished 12  cycles of Taxol chemotherapy in May of 2013.     5. Status post right modified radical mastectomy [Dr.Bowers; [place]] June of 2013, ypT1c  yp N2  MO. started also on Lerazole    7. Radiation therapy to the right breast (September of 2013).  Lymph node was positive for HER-2 receptor gene amplification of 2.22.  Will proceed with Herceptin treatment starting in September of 2013.   8.Patient has finished Herceptin (maintenance therapy) in August of 2014 8. Start patient on letrozole from November, 2013. 9. Patient started on Herceptin in September 2013.    10Patient finished 12 months of Herceptin therapy on August, 2014  # 6. Status post left side prophylactic mastectomy.  #Late MAY 2018-RECURRENCE BREAST CA- ER positive/PR negative; ?? HER-2/neu- [biopsy- proven-mediastinal lymph node; in suff for her 2 testing].  [elevated Tumor marker- CT/PET- uptake in Right Mediastinal LN; Sternum [June 2018 EBUS- Dr.Kasa]   # March 10 2017- faslodex +  Abema; OCT 5th CT-PR of mediastinal LN [consent]; Discontinued faslodex sec to insurance issues [last DEC 2023]. JAN 2024- continue Abema + Anastrzole   #August 2020-left chest wall nodule biopsy benign  #August 2019- DVT-right upper extremity; Eliquis; stop end of March 2021 [repeat ultrasound negative/patient preference]; # JAN 2022-Cirrhosis- ? On CT scan-FEB liver- Negative fro overt cirrhosis --------------------------------------------------------------- -    DIAGNOSIS: [ BREAST CANCER- ER/PR/HER2 NEU POS  STAGE:  4   ;GOALS: PALLIATIVE     Carcinoma of lower-outer quadrant of right breast in female, estrogen receptor positive (HCC)   INTERVAL HISTORY: Alone.  Ambulating independently.  Nashea Cody Frew 67 y.o.  female pleasant patient above history of metastatic ER PR positive HER-2/neu positive breast cancer currently on abema+ Anastrazole  is here for follow-up.  In the interim patient was evaluated in [facility] for acute bronchitis episodes.  Taken off abema.  Treated with antibiotics.  Symptoms improved not resolved.  Appetite is good. Fatigued. Denies any nausea vomiting no fever no chills.  Review of Systems  Constitutional:  Positive for malaise/fatigue. Negative for chills, diaphoresis, fever and weight loss.  HENT:  Negative for nosebleeds and sore throat.   Eyes:  Negative for double vision.  Respiratory:  Negative for cough, hemoptysis, sputum production, shortness of breath and wheezing.   Cardiovascular:  Negative for chest pain, palpitations, orthopnea and leg swelling.  Gastrointestinal:  Negative for abdominal pain, blood in stool, constipation, heartburn, melena, nausea and vomiting.  Musculoskeletal:  Positive for back pain and joint pain.  Neurological:  Negative for dizziness, tingling, focal weakness, weakness and headaches.  Endo/Heme/Allergies:  Does not bruise/bleed easily.  Psychiatric/Behavioral:  Negative for  depression. The patient is not  nervous/anxious and does not have insomnia.       PAST MEDICAL HISTORY :  Past Medical History:  Diagnosis Date   Arthritis    Cancer of lower-outer quadrant of female breast (HCC) 08/06/2011   RIGHT, chemo and bilateral mastectomies.   High cholesterol    History of kidney stones    Hx of bilateral breast implants    Hypertension    Lung metastases 20118   chemo pills and faslidex shots.   PONV (postoperative nausea and vomiting)    Type II diabetes mellitus (HCC)    fasting 140-150   Vertigo    LAST WEEK   Vertigo 01/2017    PAST SURGICAL HISTORY :   Past Surgical History:  Procedure Laterality Date   BREAST BIOPSY  07/2011, 2020   right   BREAST RECONSTRUCTION  03/07/2012   Procedure: BREAST RECONSTRUCTION;  Surgeon: Etter Sjogren, MD;  Location: [facility];  Service: Plastics;  Laterality: Left;  BREAST RECONSTRUCTION WITH PLACEMENT OF TISSUE EXPANDER TO LEFT BREAST   BREAST RECONSTRUCTION  02/06/2013   CESAREAN SECTION  [id number]   ENDOBRONCHIAL ULTRASOUND N/A 02/24/2017   Procedure: ENDOBRONCHIAL ULTRASOUND;  Surgeon: Erin Fulling, MD;  Location: [facility] ORS;  Service: Cardiopulmonary;  Laterality: N/A;   LATISSIMUS FLAP TO BREAST Right 02/06/2013   Procedure: LATISSIMUS FLAP TO RIGHT BREAST WITH IMPLANT;  Surgeon: Etter Sjogren, MD;  Location: [facility] OR;  Service: Plastics;  Laterality: Right;   MASTECTOMY  03/07/12   modified right; total left   MODIFIED MASTECTOMY  03/07/2012   Procedure: MODIFIED MASTECTOMY;  Surgeon: Currie Paris, MD;  Location: [facility] OR;  Service: General;  Laterality: Right;   PORTACATH PLACEMENT  07/2011   SCAR REVISION  03/30/2012   Procedure: SCAR REVISION;  Surgeon: Currie Paris, MD;  Location: [facility];  Service: General;  Laterality: Right;  CLOSURE OF MASTECTOMY INCISION   WISDOM TOOTH EXTRACTION      FAMILY HISTORY :   Family History  Problem Relation Age of Onset   Breast cancer Mother    Diabetes Father     SOCIAL HISTORY:    Social History   Tobacco Use   Smoking status: Never   Smokeless tobacco: Never  Vaping Use   Vaping Use: Never used  Substance Use Topics   Alcohol use: No   Drug use: No    ALLERGIES:  is allergic to sulfa antibiotics.  MEDICATIONS:  Current Outpatient Medications  Medication Sig Dispense Refill   abemaciclib (VERZENIO) 100 MG tablet TAKE 1 TABLET BY MOUTH TWICE DAILY. DO NOT CHEW, CRUSH, OR SPLIT TABLETS BEFORE SWALLOWING 56 tablet 6   albuterol (VENTOLIN HFA) 108 (90 Base) MCG/ACT inhaler Inhale 1-2 puffs into the lungs every 6 (six) hours as needed for wheezing or shortness of breath. 8 g 2   amoxicillin-clavulanate (AUGMENTIN) 875-125 MG tablet Take 1 tablet by mouth 2 (two) times daily. 20 tablet 0   anastrozole (ARIMIDEX) 1 MG tablet TAKE 1 TABLET BY MOUTH EVERY DAY 90 tablet 2   aspirin EC 81 MG tablet Take 81 mg by mouth daily.     atorvastatin (LIPITOR) 20 MG tablet Take 1 tablet (20 mg total) by mouth daily. 90 tablet 3   Calcium Carb-Cholecalciferol 500-10 MG-MCG TABS Take by mouth.     Cholecalciferol 50 MCG (2000 UT) CAPS Take 3 capsules daily for 3 months, then reduce to 1 capsule daily thereafter for Vitamin D Deficiency.  clotrimazole-betamethasone (LOTRISONE) cream SMARTSIG:Topical Morning-Evening     Cranberry-Vitamin C-Probiotic (AZO CRANBERRY PO) Take 1 Capful by mouth daily at 12 noon.     cyanocobalamin (VITAMIN B12) 1000 MCG tablet 1,000 mcg daily. Take 2 tablets daily for 2 weeks, then reduce to 1 tablet daily thereafter for Vitamin B12 Deficiency.     glipiZIDE (GLUCOTROL XL) 10 MG 24 hr tablet Take 1 tablet (10 mg total) by mouth 2 (two) times daily. 180 tablet 3   insulin aspart (NOVOLOG FLEXPEN) 100 UNIT/ML FlexPen Inject 4 Units into the skin 3 (three) times daily with meals. 15 mL 0   Insulin Detemir (LEVEMIR) 100 UNIT/ML Pen Inject 20 Units into the skin at bedtime. 15 mL 0   levothyroxine (SYNTHROID) 25 MCG tablet Take 1 tablet (25 mcg total) by  mouth daily before breakfast. 90 tablet 1   lisinopril (ZESTRIL) 10 MG tablet Take 10 mg by mouth daily.     loperamide (IMODIUM A-D) 2 MG tablet Take 2 mg by mouth 4 (four) times daily as needed for diarrhea or loose stools.     magnesium oxide (MAG-OX) 400 MG tablet Take 1 tablet by mouth daily.     Melatonin 10 MG TABS Take 1 tablet by mouth at bedtime as needed.     ondansetron (ZOFRAN-ODT) 4 MG disintegrating tablet Take 1 tablet (4 mg total) by mouth every 8 (eight) hours as needed for nausea or vomiting. 30 tablet 3   potassium chloride (KLOR-CON M) 10 MEQ tablet Take 1 tablet (10 mEq total) by mouth daily. 7 tablet 0   TRULICITY 0.75 MG/0.5ML SOPN SMARTSIG:0.5 Milliliter(s) SUB-Q Once a Week     No current facility-administered medications for this visit.    PHYSICAL EXAMINATION: ECOG PERFORMANCE STATUS: 1 - Symptomatic but completely ambulatory  BP 133/71 (BP Location: Left Arm, Patient Position: Sitting, Cuff Size: Large)   Pulse (!) 107   Temp (!) 96.9 F (36.1 C) (Tympanic)   Ht 5' 2.5" (1.588 m)   Wt 157 lb 6.4 oz (71.4 kg)   SpO2 97%   BMI 28.33 kg/m   Filed Weights   01/20/23 0911  Weight: 157 lb 6.4 oz (71.4 kg)       Physical Exam Constitutional:      Comments: She is alone.   HENT:     Head: Normocephalic and atraumatic.     Mouth/Throat:     Pharynx: No oropharyngeal exudate.  Eyes:     Pupils: Pupils are equal, round, and reactive to light.  Cardiovascular:     Rate and Rhythm: Normal rate and regular rhythm.  Pulmonary:     Effort: Pulmonary effort is normal. No respiratory distress.     Breath sounds: Normal breath sounds. No wheezing.  Abdominal:     General: Bowel sounds are normal. There is no distension.     Palpations: Abdomen is soft. There is no mass.     Tenderness: There is no abdominal tenderness. There is no guarding or rebound.  Musculoskeletal:        General: No tenderness. Normal range of motion.     Cervical back: Normal range  of motion and neck supple.     Comments: Approximately 1 cm hard nodule felt in the anterior chest wall/left parasternal.  Skin:    General: Skin is warm.  Neurological:     Mental Status: She is alert and oriented to person, place, and time.  Psychiatric:        Mood and Affect:  Affect normal.    LABORATORY DATA:  I have reviewed the data as listed    Component Value Date/Time   NA 132 (L) 01/20/2023 0914   NA 135 11/28/2014 1401   K 4.0 01/20/2023 0914   K 4.7 11/28/2014 1401   CL 97 (L) 01/20/2023 0914   CL 99 (L) 11/28/2014 1401   CO2 24 01/20/2023 0914   CO2 28 11/28/2014 1401   GLUCOSE 330 (H) 01/20/2023 0914   GLUCOSE 215 (H) 11/28/2014 1401   BUN 15 01/20/2023 0914   BUN 16 11/28/2014 1401   CREATININE 1.22 (H) 01/20/2023 0914   CREATININE 0.96 11/28/2014 1401   CALCIUM 10.4 (H) 01/20/2023 0914   CALCIUM 9.9 11/28/2014 1401   PROT 6.9 01/20/2023 0914   PROT 7.6 11/28/2014 1401   ALBUMIN 3.2 (L) 01/20/2023 0914   ALBUMIN 4.5 11/28/2014 1401   AST 49 (H) 01/20/2023 0914   ALT 24 01/20/2023 0914   ALT 28 11/28/2014 1401   ALKPHOS 103 01/20/2023 0914   ALKPHOS 78 11/28/2014 1401   BILITOT 0.5 01/20/2023 0914   GFRNONAA 48 (L) 01/20/2023 0914   GFRNONAA >60 11/28/2014 1401   GFRAA >60 05/30/2020 1022   GFRAA >60 11/28/2014 1401    No results found for: "SPEP", "UPEP"  Lab Results  Component Value Date   WBC 3.3 (L) 01/20/2023   NEUTROABS 1.6 (L) 01/20/2023   HGB 9.6 (L) 01/20/2023   HCT 28.3 (L) 01/20/2023   MCV 99.3 01/20/2023   PLT 257 01/20/2023      Chemistry      Component Value Date/Time   NA 132 (L) 01/20/2023 0914   NA 135 11/28/2014 1401   K 4.0 01/20/2023 0914   K 4.7 11/28/2014 1401   CL 97 (L) 01/20/2023 0914   CL 99 (L) 11/28/2014 1401   CO2 24 01/20/2023 0914   CO2 28 11/28/2014 1401   BUN 15 01/20/2023 0914   BUN 16 11/28/2014 1401   CREATININE 1.22 (H) 01/20/2023 0914   CREATININE 0.96 11/28/2014 1401      Component Value  Date/Time   CALCIUM 10.4 (H) 01/20/2023 0914   CALCIUM 9.9 11/28/2014 1401   ALKPHOS 103 01/20/2023 0914   ALKPHOS 78 11/28/2014 1401   AST 49 (H) 01/20/2023 0914   ALT 24 01/20/2023 0914   ALT 28 11/28/2014 1401   BILITOT 0.5 01/20/2023 0914       RADIOGRAPHIC STUDIES: I have personally reviewed the radiological images as listed and agreed with the findings in the report. No results found.   ASSESSMENT & PLAN:  Carcinoma of lower-outer quadrant of right breast in female, estrogen receptor positive (HCC)  # Recurrent breast cancer-ER positive PR negative ? HER-2/neu-currently on abema+ Anastrozole. JAN 2024-Discontinued faslodex sec to insurance issues.]  MARCH 15th, 2023- CT CAP-  No definitive evidence to suggest recurrent or metastatic disease in the chest, abdomen or pelvis. Stable.  # Continue Anastrazole [JAN Mid 2024-] for now. Continue to HOLD Abema [ 100 BID; off weekends].Labs today-ANC- 1.6  Hb-10-11; platelets-N-  # Acute Bronchitis- currently on augumentin x 2 more days. Recommend adding claritin D OTC.    # Bilateral itchy watery eyes and sneezing. No improvement from Montelukast- stable.   # Hyponatremia [131]/CKD stage III- sec to poor hydration/diarrhea- ? abema- recommend increasing fluids/salt intake electrolytes. stable.   # Hypomagnesia: 1.6- on OTC mag-stable.   # DM- BG- 330s/p endocrinology evaluation; with diet and medications [trucilicity; Novolog]- stable.   # recurrent  UTI/ ? Suprapubic tenderness- S/p eval with urology.  Monitor closely on Abema/with neutropenia- awaiting evaluation with Consuello Masse re: vaginal atrophy today.  # intermittent Hypercalcemia: Ca 10.4- monitor for now. FEB Vit D - 90; HOLD off calcium and Vit D-   # Low B 12 [PCP]- on B12 PO. stable.   # IV access; PIV.   #DISPOSITION: **early AM appt  # Follow up in 2  weeks- Thursday -MD/ labs [CBC/CMP/b12; mag; Ca 27-29;] b12; Vit D 25-OH levels-  Dr.B    Orders Placed This  Encounter  Procedures   CBC with Differential (Cancer Center Only)    Standing Status:   Future    Standing Expiration Date:   01/20/2024   CMP (Cancer Center only)    Standing Status:   Future    Standing Expiration Date:   01/20/2024   Magnesium    Standing Status:   Future    Standing Expiration Date:   01/20/2024   Vitamin B12    Standing Status:   Future    Standing Expiration Date:   01/20/2024   VITAMIN D 25 Hydroxy (Vit-D Deficiency, Fractures)    Standing Status:   Future    Standing Expiration Date:   01/20/2024   All questions were answered. The patient knows to call the clinic with any problems, questions or concerns.      Earna Coder, MD 01/20/2023 10:28 AM

## 2023-01-20 NOTE — Progress Notes (Signed)
C/o weakness, recovering from the flu from last week.

## 2023-01-20 NOTE — Patient Instructions (Signed)
#   Do not take  Verzinio continue further directions.  # Also stop taking vitamin D-

## 2023-01-20 NOTE — Assessment & Plan Note (Addendum)
#   Recurrent breast cancer-ER positive PR negative ? HER-2/neu-currently on abema+ Anastrozole. JAN 2024-Discontinued faslodex sec to insurance issues.]  MARCH 15th, 2023- CT CAP-  No definitive evidence to suggest recurrent or metastatic disease in the chest, abdomen or pelvis. Stable.  # Continue Anastrazole [JAN Mid 2024-] for now. Continue to HOLD Abema [ 100 BID; off weekends].Labs today-ANC- 1.6  Hb-10-11; platelets-N-  # Acute Bronchitis- currently on augumentin x 2 more days. Recommend adding claritin D OTC.    # Bilateral itchy watery eyes and sneezing. No improvement from Montelukast- stable.   # Hyponatremia [131]/CKD stage III- sec to poor hydration/diarrhea- ? abema- recommend increasing fluids/salt intake electrolytes. stable.   # Hypomagnesia: 1.6- on OTC mag-stable.   # DM- BG- 330s/p endocrinology evaluation; with diet and medications [trucilicity; Novolog]- stable.   # recurrent UTI/ ? Suprapubic tenderness- S/p eval with urology.  Monitor closely on Abema/with neutropenia- awaiting evaluation with Consuello Masse re: vaginal atrophy today.  # intermittent Hypercalcemia: Ca 10.4- monitor for now. FEB Vit D - 90; HOLD off calcium and Vit D-   # Low B 12 [PCP]- on B12 PO. stable.   # IV access; PIV.   #DISPOSITION: **early AM appt  # Follow up in 2  weeks- Thursday -MD/ labs [CBC/CMP/b12; mag; Ca 27-29;] b12; Vit D 25-OH levels-  Dr.B

## 2023-01-21 ENCOUNTER — Other Ambulatory Visit: Payer: PPO

## 2023-01-21 ENCOUNTER — Ambulatory Visit: Payer: PPO | Admitting: Internal Medicine

## 2023-01-21 ENCOUNTER — Ambulatory Visit: Payer: PPO | Admitting: Nurse Practitioner

## 2023-01-21 LAB — CANCER ANTIGEN 27.29: CA 27.29: 37.1 U/mL (ref 0.0–38.6)

## 2023-01-26 ENCOUNTER — Inpatient Hospital Stay: Payer: PPO | Admitting: Occupational Therapy

## 2023-02-02 ENCOUNTER — Other Ambulatory Visit: Payer: Self-pay | Admitting: *Deleted

## 2023-02-02 DIAGNOSIS — C50511 Malignant neoplasm of lower-outer quadrant of right female breast: Secondary | ICD-10-CM

## 2023-02-03 ENCOUNTER — Encounter: Payer: Self-pay | Admitting: Internal Medicine

## 2023-02-03 ENCOUNTER — Inpatient Hospital Stay: Payer: PPO | Admitting: Internal Medicine

## 2023-02-03 ENCOUNTER — Inpatient Hospital Stay: Payer: PPO | Attending: Internal Medicine

## 2023-02-03 VITALS — BP 121/79 | HR 115 | Temp 96.7°F | Ht 62.5 in | Wt 154.2 lb

## 2023-02-03 DIAGNOSIS — Z79899 Other long term (current) drug therapy: Secondary | ICD-10-CM | POA: Insufficient documentation

## 2023-02-03 DIAGNOSIS — Z170 Estrogen receptor positive status [ER+]: Secondary | ICD-10-CM | POA: Diagnosis not present

## 2023-02-03 DIAGNOSIS — E871 Hypo-osmolality and hyponatremia: Secondary | ICD-10-CM | POA: Insufficient documentation

## 2023-02-03 DIAGNOSIS — Z803 Family history of malignant neoplasm of breast: Secondary | ICD-10-CM | POA: Insufficient documentation

## 2023-02-03 DIAGNOSIS — Z794 Long term (current) use of insulin: Secondary | ICD-10-CM | POA: Diagnosis not present

## 2023-02-03 DIAGNOSIS — R000 Tachycardia, unspecified: Secondary | ICD-10-CM | POA: Insufficient documentation

## 2023-02-03 DIAGNOSIS — Z86718 Personal history of other venous thrombosis and embolism: Secondary | ICD-10-CM | POA: Insufficient documentation

## 2023-02-03 DIAGNOSIS — Z7985 Long-term (current) use of injectable non-insulin antidiabetic drugs: Secondary | ICD-10-CM | POA: Insufficient documentation

## 2023-02-03 DIAGNOSIS — Z79811 Long term (current) use of aromatase inhibitors: Secondary | ICD-10-CM | POA: Diagnosis not present

## 2023-02-03 DIAGNOSIS — E1122 Type 2 diabetes mellitus with diabetic chronic kidney disease: Secondary | ICD-10-CM | POA: Diagnosis not present

## 2023-02-03 DIAGNOSIS — C7800 Secondary malignant neoplasm of unspecified lung: Secondary | ICD-10-CM | POA: Insufficient documentation

## 2023-02-03 DIAGNOSIS — C50511 Malignant neoplasm of lower-outer quadrant of right female breast: Secondary | ICD-10-CM | POA: Diagnosis not present

## 2023-02-03 DIAGNOSIS — Z7982 Long term (current) use of aspirin: Secondary | ICD-10-CM | POA: Insufficient documentation

## 2023-02-03 DIAGNOSIS — I129 Hypertensive chronic kidney disease with stage 1 through stage 4 chronic kidney disease, or unspecified chronic kidney disease: Secondary | ICD-10-CM | POA: Insufficient documentation

## 2023-02-03 DIAGNOSIS — Z9013 Acquired absence of bilateral breasts and nipples: Secondary | ICD-10-CM | POA: Diagnosis not present

## 2023-02-03 DIAGNOSIS — Z7984 Long term (current) use of oral hypoglycemic drugs: Secondary | ICD-10-CM | POA: Insufficient documentation

## 2023-02-03 LAB — CBC WITH DIFFERENTIAL (CANCER CENTER ONLY)
Abs Immature Granulocytes: 0.01 10*3/uL (ref 0.00–0.07)
Basophils Absolute: 0.1 10*3/uL (ref 0.0–0.1)
Basophils Relative: 2 %
Eosinophils Absolute: 0.1 10*3/uL (ref 0.0–0.5)
Eosinophils Relative: 3 %
HCT: 32.8 % — ABNORMAL LOW (ref 36.0–46.0)
Hemoglobin: 11.2 g/dL — ABNORMAL LOW (ref 12.0–15.0)
Immature Granulocytes: 0 %
Lymphocytes Relative: 36 %
Lymphs Abs: 1.2 10*3/uL (ref 0.7–4.0)
MCH: 32.7 pg (ref 26.0–34.0)
MCHC: 34.1 g/dL (ref 30.0–36.0)
MCV: 95.6 fL (ref 80.0–100.0)
Monocytes Absolute: 0.3 10*3/uL (ref 0.1–1.0)
Monocytes Relative: 8 %
Neutro Abs: 1.7 10*3/uL (ref 1.7–7.7)
Neutrophils Relative %: 51 %
Platelet Count: 248 10*3/uL (ref 150–400)
RBC: 3.43 MIL/uL — ABNORMAL LOW (ref 3.87–5.11)
RDW: 13.2 % (ref 11.5–15.5)
WBC Count: 3.3 10*3/uL — ABNORMAL LOW (ref 4.0–10.5)
nRBC: 0 % (ref 0.0–0.2)

## 2023-02-03 LAB — CMP (CANCER CENTER ONLY)
ALT: 30 U/L (ref 0–44)
AST: 46 U/L — ABNORMAL HIGH (ref 15–41)
Albumin: 3.4 g/dL — ABNORMAL LOW (ref 3.5–5.0)
Alkaline Phosphatase: 83 U/L (ref 38–126)
Anion gap: 9 (ref 5–15)
BUN: 12 mg/dL (ref 8–23)
CO2: 22 mmol/L (ref 22–32)
Calcium: 9.9 mg/dL (ref 8.9–10.3)
Chloride: 101 mmol/L (ref 98–111)
Creatinine: 0.91 mg/dL (ref 0.44–1.00)
GFR, Estimated: >60 mL/min (ref >60)
Glucose, Bld: 219 mg/dL — ABNORMAL HIGH (ref 70–99)
Potassium: 3.6 mmol/L (ref 3.5–5.1)
Sodium: 132 mmol/L — ABNORMAL LOW (ref 135–145)
Total Bilirubin: 0.6 mg/dL (ref 0.3–1.2)
Total Protein: 6.9 g/dL (ref 6.5–8.1)

## 2023-02-03 LAB — VITAMIN B12: Vitamin B-12: 1310 pg/mL — ABNORMAL HIGH (ref 180–914)

## 2023-02-03 LAB — MAGNESIUM: Magnesium: 1.4 mg/dL — ABNORMAL LOW (ref 1.7–2.4)

## 2023-02-03 LAB — VITAMIN D 25 HYDROXY (VIT D DEFICIENCY, FRACTURES): Vit D, 25-Hydroxy: 78.62 ng/mL (ref 30–100)

## 2023-02-03 MED ORDER — CARVEDILOL 6.25 MG PO TABS: 6.2500 mg | ORAL_TABLET | Freq: Two times a day (BID) | ORAL | 3 refills | Status: DC

## 2023-02-03 NOTE — Progress Notes (Signed)
Pt had the flu since last visit.

## 2023-02-03 NOTE — Progress Notes (Signed)
[name] OFFICE PROGRESS NOTE  Patient Care Team: Luciana Axe, NP as PCP - General (Family Medicine) Cyndia Bent, MD (Inactive) (General Surgery) Harold Hedge, MD (Obstetrics and Gynecology) Carmina Miller, MD Johney Maine, MD (Unknown Physician Specialty) Earna Coder, MD as Consulting Physician (Hematology and Oncology)   Cancer Staging  Carcinoma of lower-outer quadrant of right breast in female, estrogen receptor positive [facility]) Staging form: Breast, AJCC 7th Edition - Pathologic: ypT1c,ypN2a, MX - Signed by Currie Paris, MD on 03/10/2012 Cancer stage: ypT1c,ypN2a, MX    Oncology History Overview Note  # DEC 2012- RIGHT BREAST CA T2 N2 M0 tumor from biopsy.  Estrogen receptor positive, Progesterone receptor positive.  Current receptor negative by FISH 2. Neoadjuvant chemotherapy started in December of 28 with Cytoxan Adriamycin 3. Started on Taxol weekly chemotherapy. 4. Patient finished 12  cycles of Taxol chemotherapy in May of 2013.     5. Status post right modified radical mastectomy [Dr.Bowers; [place]] June of 2013, ypT1c  yp N2  MO. started also on Lerazole    7. Radiation therapy to the right breast (September of 2013).  Lymph node was positive for HER-2 receptor gene amplification of 2.22.  Will proceed with Herceptin treatment starting in September of 2013.   8.Patient has finished Herceptin (maintenance therapy) in August of 2014 8. Start patient on letrozole from November, 2013. 9. Patient started on Herceptin in September 2013.    10Patient finished 12 months of Herceptin therapy on August, 2014  # 6. Status post left side prophylactic mastectomy.  #Late MAY 2018-RECURRENCE BREAST CA- ER positive/PR negative; ?? HER-2/neu- [biopsy- proven-mediastinal lymph node; in suff for her 2 testing].  [elevated Tumor marker- CT/PET- uptake in Right Mediastinal LN; Sternum [June 2018 EBUS- Dr.Kasa]   # March 10 2017- faslodex +  Abema; OCT 5th CT-PR of mediastinal LN [consent]; Discontinued faslodex sec to insurance issues [last DEC 2023]. JAN 2024- continue Abema + Anastrzole   #August 2020-left chest wall nodule biopsy benign  #August 2019- DVT-right upper extremity; Eliquis; stop end of March 2021 [repeat ultrasound negative/patient preference]; # JAN 2022-Cirrhosis- ? On CT scan-FEB liver- Negative fro overt cirrhosis --------------------------------------------------------------- -    DIAGNOSIS: [ BREAST CANCER- ER/PR/HER2 NEU POS  STAGE:  4   ;GOALS: PALLIATIVE     Carcinoma of lower-outer quadrant of right breast in female, estrogen receptor positive (HCC)   INTERVAL HISTORY: Alone.  Ambulating independently.  Kim[facility]ly [name] 67 y.o.  female pleasant patient above history of metastatic ER PR positive HER-2/neu positive breast cancer currently on abema+ Anastrazole  is here for follow-up.  Pt had the flu since last visit. Feels much better. Symptoms resolved.   Appetite is good. Fatigued. Denies any nausea vomiting no fever no chills.  Review of Systems  Constitutional:  Positive for malaise/fatigue. Negative for chills, diaphoresis, fever and weight loss.  HENT:  Negative for nosebleeds and sore throat.   Eyes:  Negative for double vision.  Respiratory:  Negative for cough, hemoptysis, sputum production, shortness of breath and wheezing.   Cardiovascular:  Negative for chest pain, palpitations, orthopnea and leg swelling.  Gastrointestinal:  Negative for abdominal pain, blood in stool, constipation, heartburn, melena, nausea and vomiting.  Musculoskeletal:  Positive for back pain and joint pain.  Neurological:  Negative for dizziness, tingling, focal weakness, weakness and headaches.  Endo/Heme/Allergies:  Does not bruise/bleed easily.  Psychiatric/Behavioral:  Negative for depression. The patient is not nervous/anxious and does not have insomnia.  PAST MEDICAL HISTORY :  Past  Medical History:  Diagnosis Date   Arthritis    Cancer of lower-outer quadrant of female breast (HCC) 08/06/2011   RIGHT, chemo and bilateral mastectomies.   High cholesterol    History of kidney stones    Hx of bilateral breast implants    Hypertension    Lung metastases 20118   chemo pills and faslidex shots.   PONV (postoperative nausea and vomiting)    Type II diabetes mellitus (HCC)    fasting 140-150   Vertigo    LAST WEEK   Vertigo 01/2017    PAST SURGICAL HISTORY :   Past Surgical History:  Procedure Laterality Date   BREAST BIOPSY  07/2011, 2020   right   BREAST RECONSTRUCTION  03/07/2012   Procedure: BREAST RECONSTRUCTION;  Surgeon: Etter Sjogren, MD;  Location: [facility] OR;  Service: Plastics;  Laterality: Left;  BREAST RECONSTRUCTION WITH PLACEMENT OF TISSUE EXPANDER TO LEFT BREAST   BREAST RECONSTRUCTION  02/06/2013   CESAREAN SECTION  [id number]   ENDOBRONCHIAL ULTRASOUND N/A 02/24/2017   Procedure: ENDOBRONCHIAL ULTRASOUND;  Surgeon: Erin Fulling, MD;  Location: [facility] ORS;  Service: Cardiopulmonary;  Laterality: N/A;   LATISSIMUS FLAP TO BREAST Right 02/06/2013   Procedure: LATISSIMUS FLAP TO RIGHT BREAST WITH IMPLANT;  Surgeon: Etter Sjogren, MD;  Location: [facility] OR;  Service: Plastics;  Laterality: Right;   MASTECTOMY  03/07/12   modified right; total left   MODIFIED MASTECTOMY  03/07/2012   Procedure: MODIFIED MASTECTOMY;  Surgeon: Currie Paris, MD;  Location: [facility] OR;  Service: General;  Laterality: Right;   PORTACATH PLACEMENT  07/2011   SCAR REVISION  03/30/2012   Procedure: SCAR REVISION;  Surgeon: Currie Paris, MD;  Location: [facility];  Service: General;  Laterality: Right;  CLOSURE OF MASTECTOMY INCISION   WISDOM TOOTH EXTRACTION      FAMILY HISTORY :   Family History  Problem Relation Age of Onset   Breast cancer Mother    Diabetes Father     SOCIAL HISTORY:   Social History   Tobacco Use   Smoking status: Never   Smokeless tobacco:  Never  Vaping Use   Vaping Use: Never used  Substance Use Topics   Alcohol use: No   Drug use: No    ALLERGIES:  is allergic to sulfa antibiotics.  MEDICATIONS:  Current Outpatient Medications  Medication Sig Dispense Refill   anastrozole (ARIMIDEX) 1 MG tablet TAKE 1 TABLET BY MOUTH EVERY DAY 90 tablet 2   aspirin EC 81 MG tablet Take 81 mg by mouth daily.     atorvastatin (LIPITOR) 20 MG tablet Take 1 tablet (20 mg total) by mouth daily. 90 tablet 3   Calcium Carb-Cholecalciferol 500-10 MG-MCG TABS Take by mouth.     carvedilol (COREG) 6.25 MG tablet Take 1 tablet (6.25 mg total) by mouth 2 (two) times daily with a meal. 60 tablet 3   Cholecalciferol 50 MCG (2000 UT) CAPS Take 3 capsules daily for 3 months, then reduce to 1 capsule daily thereafter for Vitamin D Deficiency.     clotrimazole-betamethasone (LOTRISONE) cream SMARTSIG:Topical Morning-Evening     Cranberry-Vitamin C-Probiotic (AZO CRANBERRY PO) Take 1 Capful by mouth daily at 12 noon.     cyanocobalamin (VITAMIN B12) 1000 MCG tablet 1,000 mcg daily. Take 2 tablets daily for 2 weeks, then reduce to 1 tablet daily thereafter for Vitamin B12 Deficiency.     glipiZIDE (GLUCOTROL XL) 10 MG 24 hr tablet  Take 1 tablet (10 mg total) by mouth 2 (two) times daily. 180 tablet 3   insulin aspart (NOVOLOG FLEXPEN) 100 UNIT/ML FlexPen Inject 4 Units into the skin 3 (three) times daily with meals. 15 mL 0   Insulin Detemir (LEVEMIR) 100 UNIT/ML Pen Inject 20 Units into the skin at bedtime. 15 mL 0   levothyroxine (SYNTHROID) 25 MCG tablet Take 1 tablet (25 mcg total) by mouth daily before breakfast. 90 tablet 1   lisinopril (ZESTRIL) 10 MG tablet Take 10 mg by mouth daily.     loperamide (IMODIUM A-D) 2 MG tablet Take 2 mg by mouth 4 (four) times daily as needed for diarrhea or loose stools.     magnesium oxide (MAG-OX) 400 MG tablet Take 1 tablet by mouth daily.     Melatonin 10 MG TABS Take 1 tablet by mouth at bedtime as needed.      ondansetron (ZOFRAN-ODT) 4 MG disintegrating tablet Take 1 tablet (4 mg total) by mouth every 8 (eight) hours as needed for nausea or vomiting. 30 tablet 3   TRULICITY 0.75 MG/0.5ML SOPN SMARTSIG:0.5 Milliliter(s) SUB-Q Once a Week     abemaciclib (VERZENIO) 100 MG tablet TAKE 1 TABLET BY MOUTH TWICE DAILY. DO NOT CHEW, CRUSH, OR SPLIT TABLETS BEFORE SWALLOWING (Patient not taking: Reported on 02/03/2023) 56 tablet 6   albuterol (VENTOLIN HFA) 108 (90 Base) MCG/ACT inhaler Inhale 1-2 puffs into the lungs every 6 (six) hours as needed for wheezing or shortness of breath. (Patient not taking: Reported on 02/03/2023) 8 g 2   potassium chloride (KLOR-CON M) 10 MEQ tablet Take 1 tablet (10 mEq total) by mouth daily. (Patient not taking: Reported on 02/03/2023) 7 tablet 0   No current facility-administered medications for this visit.    PHYSICAL EXAMINATION: ECOG PERFORMANCE STATUS: 1 - Symptomatic but completely ambulatory  BP 121/79 (BP Location: Left Arm, Patient Position: Sitting, Cuff Size: Normal)   Pulse (!) 115   Temp (!) 96.7 F (35.9 C) (Tympanic)   Ht 5' 2.5" (1.588 m)   Wt 154 lb 3.2 oz (69.9 kg)   SpO2 98%   BMI 27.75 kg/m   Filed Weights   02/03/23 0917  Weight: 154 lb 3.2 oz (69.9 kg)       Physical Exam Constitutional:      Comments: She is alone.   HENT:     Head: Normocephalic and atraumatic.     Mouth/Throat:     Pharynx: No oropharyngeal exudate.  Eyes:     Pupils: Pupils are equal, round, and reactive to light.  Cardiovascular:     Rate and Rhythm: Normal rate and regular rhythm.  Pulmonary:     Effort: Pulmonary effort is normal. No respiratory distress.     Breath sounds: Normal breath sounds. No wheezing.  Abdominal:     General: Bowel sounds are normal. There is no distension.     Palpations: Abdomen is soft. There is no mass.     Tenderness: There is no abdominal tenderness. There is no guarding or rebound.  Musculoskeletal:        General: No  tenderness. Normal range of motion.     Cervical back: Normal range of motion and neck supple.     Comments: Approximately 1 cm hard nodule felt in the anterior chest wall/left parasternal.  Skin:    General: Skin is warm.  Neurological:     Mental Status: She is alert and oriented to person, place, and time.  Psychiatric:  Mood and Affect: Affect normal.    LABORATORY DATA:  I have reviewed the data as listed    Component Value Date/Time   NA 132 (L) 02/03/2023 0928   NA 135 11/28/2014 1401   K 3.6 02/03/2023 0928   K 4.7 11/28/2014 1401   CL 101 02/03/2023 0928   CL 99 (L) 11/28/2014 1401   CO2 22 02/03/2023 0928   CO2 28 11/28/2014 1401   GLUCOSE 219 (H) 02/03/2023 0928   GLUCOSE 215 (H) 11/28/2014 1401   BUN 12 02/03/2023 0928   BUN 16 11/28/2014 1401   CREATININE 0.91 02/03/2023 0928   CREATININE 0.96 11/28/2014 1401   CALCIUM 9.9 02/03/2023 0928   CALCIUM 9.9 11/28/2014 1401   PROT 6.9 02/03/2023 0928   PROT 7.6 11/28/2014 1401   ALBUMIN 3.4 (L) 02/03/2023 0928   ALBUMIN 4.5 11/28/2014 1401   AST 46 (H) 02/03/2023 0928   ALT 30 02/03/2023 0928   ALT 28 11/28/2014 1401   ALKPHOS 83 02/03/2023 0928   ALKPHOS 78 11/28/2014 1401   BILITOT 0.6 02/03/2023 0928   GFRNONAA >60 02/03/2023 0928   GFRNONAA >60 11/28/2014 1401   GFRAA >60 05/30/2020 1022   GFRAA >60 11/28/2014 1401    No results found for: "SPEP", "UPEP"  Lab Results  Component Value Date   WBC 3.3 (L) 02/03/2023   NEUTROABS 1.7 02/03/2023   HGB 11.2 (L) 02/03/2023   HCT 32.8 (L) 02/03/2023   MCV 95.6 02/03/2023   PLT 248 02/03/2023      Chemistry      Component Value Date/Time   NA 132 (L) 02/03/2023 0928   NA 135 11/28/2014 1401   K 3.6 02/03/2023 0928   K 4.7 11/28/2014 1401   CL 101 02/03/2023 0928   CL 99 (L) 11/28/2014 1401   CO2 22 02/03/2023 0928   CO2 28 11/28/2014 1401   BUN 12 02/03/2023 0928   BUN 16 11/28/2014 1401   CREATININE 0.91 02/03/2023 0928   CREATININE  0.96 11/28/2014 1401      Component Value Date/Time   CALCIUM 9.9 02/03/2023 0928   CALCIUM 9.9 11/28/2014 1401   ALKPHOS 83 02/03/2023 0928   ALKPHOS 78 11/28/2014 1401   AST 46 (H) 02/03/2023 0928   ALT 30 02/03/2023 0928   ALT 28 11/28/2014 1401   BILITOT 0.6 02/03/2023 0928       RADIOGRAPHIC STUDIES: I have personally reviewed the radiological images as listed and agreed with the findings in the report. No results found.   ASSESSMENT & PLAN:  Carcinoma of lower-outer quadrant of right breast in female, estrogen receptor positive (HCC)  # Recurrent breast cancer-ER positive PR negative ? HER-2/neu-currently on abema+ Anastrozole. JAN 2024-Discontinued faslodex sec to insurance issues.]  MARCH 15th, 2023- CT [facility]-  No definitive evidence to suggest recurrent or metastatic disease in the chest, abdomen or pelvis. Stable.  # Continue Anastrazole [JAN Mid 2024-] for now. Re-Start Abema [100 BID; off weekends].Labs today-ANC- 1.7  Hb-11; platelets-N. Will repeat imaging in 2 months or so. Will order at next visit.  # Sinus Tachycardia- recommend low dose coreg 6.125 BID. Script sent.   # Bilateral itchy watery eyes and sneezing. No improvement from Montelukast- stable.   # Hyponatremia [131]/CKD stage III- sec to poor hydration/diarrhea- ? abema- recommend increasing fluids/salt intake electrolytes. stable.   # Hypomagnesia: 1.4- on OTC mag-stable.   # DM- BG-219 s/p endocrinology evaluation; with diet and medications [trucilicity; Novolog]- stable.   # recurrent  UTI/ ? Suprapubic tenderness- S/p eval with urology.  Monitor closely on Abema/with neutropenia- awaiting evaluation with Consuello Masse re: vaginal atrophy today.  # intermittent Hypercalcemia: Ca 10.4- monitor for now. FEB Vit D - 90; HOLD off calcium and Vit D- stable.   # Low B 12 [PCP]- on B12 PO. stable.   # IV access; PIV.   #DISPOSITION: **early AM appt  # Follow up in 3 weeks/June 27th- Thursday -MD/ labs  [CBC/CMP/b12; mag; Ca 27-29;]-  Dr.B    No orders of the defined types were placed in this encounter.  All questions were answered. The patient knows to call the clinic with any problems, questions or concerns.      Earna Coder, MD 02/03/2023 10:32 AM

## 2023-02-03 NOTE — Assessment & Plan Note (Signed)
#   Recurrent breast cancer-ER positive PR negative ? HER-2/neu-currently on abema+ Anastrozole. JAN 2024-Discontinued faslodex sec to insurance issues.]  MARCH 15th, 2023- CT [facility]-  No definitive evidence to suggest recurrent or metastatic disease in the chest, abdomen or pelvis. Stable.  # Continue Anastrazole [JAN Mid 2024-] for now. Re-Start Abema [100 BID; off weekends].Labs today-ANC- 1.7  Hb-11; platelets-N. Will repeat imaging in 2 months or so. Will order at next visit.  # Sinus Tachycardia- recommend low dose coreg 6.125 BID. Script sent.   # Bilateral itchy watery eyes and sneezing. No improvement from Montelukast- stable.   # Hyponatremia [131]/CKD stage III- sec to poor hydration/diarrhea- ? abema- recommend increasing fluids/salt intake electrolytes. stable.   # Hypomagnesia: 1.4- on OTC mag-stable.   # DM- BG-219 s/p endocrinology evaluation; with diet and medications [trucilicity; Novolog]- stable.   # recurrent UTI/ ? Suprapubic tenderness- S/p eval with urology.  Monitor closely on Abema/with neutropenia- awaiting evaluation with Consuello Masse re: vaginal atrophy today.  # intermittent Hypercalcemia: Ca 10.4- monitor for now. FEB Vit D - 90; HOLD off calcium and Vit D- stable.   # Low B 12 [PCP]- on B12 PO. stable.   # IV access; PIV.   #DISPOSITION: **early AM appt  # Follow up in 3 weeks/June 27th- Thursday -MD/ labs [CBC/CMP/b12; mag; Ca 27-29;]-  Dr.B

## 2023-02-04 LAB — CANCER ANTIGEN 27.29: CA 27.29: 36.3 U/mL (ref 0.0–38.6)

## 2023-02-24 ENCOUNTER — Inpatient Hospital Stay: Payer: PPO | Admitting: Internal Medicine

## 2023-02-24 ENCOUNTER — Inpatient Hospital Stay: Payer: PPO

## 2023-02-24 ENCOUNTER — Encounter: Payer: Self-pay | Admitting: Internal Medicine

## 2023-02-24 DIAGNOSIS — C50511 Malignant neoplasm of lower-outer quadrant of right female breast: Secondary | ICD-10-CM

## 2023-02-24 DIAGNOSIS — Z170 Estrogen receptor positive status [ER+]: Secondary | ICD-10-CM | POA: Diagnosis not present

## 2023-02-24 LAB — COMPREHENSIVE METABOLIC PANEL
ALT: 28 U/L (ref 0–44)
AST: 40 U/L (ref 15–41)
Albumin: 3.6 g/dL (ref 3.5–5.0)
Alkaline Phosphatase: 71 U/L (ref 38–126)
Anion gap: 9 (ref 5–15)
BUN: 15 mg/dL (ref 8–23)
CO2: 20 mmol/L — ABNORMAL LOW (ref 22–32)
Calcium: 9.9 mg/dL (ref 8.9–10.3)
Chloride: 106 mmol/L (ref 98–111)
Creatinine, Ser: 1.11 mg/dL — ABNORMAL HIGH (ref 0.44–1.00)
GFR, Estimated: 54 mL/min — ABNORMAL LOW (ref >60)
Glucose, Bld: 130 mg/dL — ABNORMAL HIGH (ref 70–99)
Potassium: 3.6 mmol/L (ref 3.5–5.1)
Sodium: 135 mmol/L (ref 135–145)
Total Bilirubin: 0.6 mg/dL (ref 0.3–1.2)
Total Protein: 6.9 g/dL (ref 6.5–8.1)

## 2023-02-24 LAB — CBC WITH DIFFERENTIAL/PLATELET
Abs Immature Granulocytes: 0.02 10*3/uL (ref 0.00–0.07)
Basophils Absolute: 0.1 10*3/uL (ref 0.0–0.1)
Basophils Relative: 2 %
Eosinophils Absolute: 0.1 10*3/uL (ref 0.0–0.5)
Eosinophils Relative: 4 %
HCT: 32.2 % — ABNORMAL LOW (ref 36.0–46.0)
Hemoglobin: 10.9 g/dL — ABNORMAL LOW (ref 12.0–15.0)
Immature Granulocytes: 1 %
Lymphocytes Relative: 55 %
Lymphs Abs: 1.9 10*3/uL (ref 0.7–4.0)
MCH: 32.4 pg (ref 26.0–34.0)
MCHC: 33.9 g/dL (ref 30.0–36.0)
MCV: 95.8 fL (ref 80.0–100.0)
Monocytes Absolute: 0.2 10*3/uL (ref 0.1–1.0)
Monocytes Relative: 4 %
Neutro Abs: 1.2 10*3/uL — ABNORMAL LOW (ref 1.7–7.7)
Neutrophils Relative %: 34 %
Platelets: 181 10*3/uL (ref 150–400)
RBC: 3.36 MIL/uL — ABNORMAL LOW (ref 3.87–5.11)
RDW: 13.4 % (ref 11.5–15.5)
WBC: 3.5 10*3/uL — ABNORMAL LOW (ref 4.0–10.5)
nRBC: 0 % (ref 0.0–0.2)

## 2023-02-24 LAB — MAGNESIUM: Magnesium: 1.7 mg/dL (ref 1.7–2.4)

## 2023-02-24 MED ORDER — ABEMACICLIB 100 MG PO TABS
ORAL_TABLET | ORAL | 5 refills | Status: DC
Start: 2023-02-24 — End: 2023-09-05

## 2023-02-24 NOTE — Assessment & Plan Note (Signed)
#   Recurrent breast cancer-ER positive PR negative ? HER-2/neu-currently on abema+ Anastrozole. JAN 2024-Discontinued faslodex sec to insurance issues.]  MARCH 15th, 2023- CT [facility]-  No definitive evidence to suggest recurrent or metastatic disease in the chest, abdomen or pelvis. Stable.   # Continue Anastrazole [JAN Mid 2024-] for now. Re-Start Abema [100 BID; off weekends].Labs today-ANC- 1.2 Hb-11; platelets-N. Will repeat imaging in 39month; ordered today.   # Sinus Tachycardia- recommend/reminded to start low dose coreg 6.125 BID. Script sent-  stable.   # Bilateral itchy watery eyes and sneezing. No improvement from Montelukast-  stable.   # Hyponatremia [131]/CKD stage III- sec to poor hydration/diarrhea- ? abema- recommend increasing fluids/salt intake electrolytes. stable.   # Hypomagnesia: 1.4- on OTC mag-stable.   # DM- BG-13- s/p endocrinology evaluation; with diet and medications [trucilicity; Novolog]- stable.   # recurrent UTI/ ? Suprapubic tenderness- S/p eval with urology.  Monitor closely on Abema/with neutropenia- awaiting evaluation with Consuello Masse re: vaginal atrophy today- stable.  # intermittent Hypercalcemia: Ca 10.4- monitor for now. FEB Vit D - 90; HOLD off calcium and Vit D- stable.  # Low B 12 [PCP]- on B12 PO. stable.  # IV access; PIV.   #DISPOSITION: **early AM appt # Follow up in 4 weeks- MD; labs [CBC/CMP/b12; mag; Ca 27-29;]- CT [facility]- Dr.B

## 2023-02-24 NOTE — Progress Notes (Signed)
Needs refill verzenio with 5 refills, pending.

## 2023-02-24 NOTE — Progress Notes (Signed)
[name] OFFICE PROGRESS NOTE  Patient Care Team: Luciana Axe, NP as PCP - General (Family Medicine) Cyndia Bent, MD (Inactive) (General Surgery) Harold Hedge, MD (Obstetrics and Gynecology) Carmina Miller, MD Johney Maine, MD (Unknown Physician Specialty) Earna Coder, MD as Consulting Physician (Hematology and Oncology)   Cancer Staging  Carcinoma of lower-outer quadrant of right breast in female, estrogen receptor positive [facility]) Staging form: Breast, AJCC 7th Edition - Pathologic: ypT1c,ypN2a, MX - Signed by Currie Paris, MD on 03/10/2012 Cancer stage: ypT1c,ypN2a, MX    Oncology History Overview Note  # DEC 2012- RIGHT BREAST CA T2 N2 M0 tumor from biopsy.  Estrogen receptor positive, Progesterone receptor positive.  Current receptor negative by FISH 2. Neoadjuvant chemotherapy started in December of 28 with Cytoxan Adriamycin 3. Started on Taxol weekly chemotherapy. 4. Patient finished 12  cycles of Taxol chemotherapy in May of 2013.     5. Status post right modified radical mastectomy [Dr.Bowers; [place]] June of 2013, ypT1c  yp N2  MO. started also on Lerazole    7. Radiation therapy to the right breast (September of 2013).  Lymph node was positive for HER-2 receptor gene amplification of 2.22.  Will proceed with Herceptin treatment starting in September of 2013.   8.Patient has finished Herceptin (maintenance therapy) in August of 2014 8. Start patient on letrozole from November, 2013. 9. Patient started on Herceptin in September 2013.    10Patient finished 12 months of Herceptin therapy on August, 2014  # 6. Status post left side prophylactic mastectomy.  #Late MAY 2018-RECURRENCE BREAST CA- ER positive/PR negative; ?? HER-2/neu- [biopsy- proven-mediastinal lymph node; in suff for her 2 testing].  [elevated Tumor marker- CT/PET- uptake in Right Mediastinal LN; Sternum [June 2018 EBUS- Dr.Kasa]   # March 10 2017- faslodex +  Abema; OCT 5th CT-PR of mediastinal LN [consent]; Discontinued faslodex sec to insurance issues [last DEC 2023]. JAN 2024- continue Abema + Anastrzole   #August 2020-left chest wall nodule biopsy benign  #August 2019- DVT-right upper extremity; Eliquis; stop end of March 2021 [repeat ultrasound negative/patient preference]; # JAN 2022-Cirrhosis- ? On CT scan-FEB liver- Negative fro overt cirrhosis --------------------------------------------------------------- -    DIAGNOSIS: [ BREAST CANCER- ER/PR/HER2 NEU POS  STAGE:  4   ;GOALS: PALLIATIVE     Carcinoma of lower-outer quadrant of right breast in female, estrogen receptor positive (HCC)   INTERVAL HISTORY: Alone.  Ambulating independently.  Kimberly [name] 67 y.o.  female pleasant patient above history of metastatic ER PR positive HER-2/neu positive breast cancer currently on abema+ Anastrazole  is here for follow-up.  Appetite is good. Fatigued. Denies any nausea vomiting no fever no chills. No bladder infections.   Review of Systems  Constitutional:  Positive for malaise/fatigue. Negative for chills, diaphoresis, fever and weight loss.  HENT:  Negative for nosebleeds and sore throat.   Eyes:  Negative for double vision.  Respiratory:  Negative for cough, hemoptysis, sputum production, shortness of breath and wheezing.   Cardiovascular:  Negative for chest pain, palpitations, orthopnea and leg swelling.  Gastrointestinal:  Negative for abdominal pain, blood in stool, constipation, heartburn, melena, nausea and vomiting.  Musculoskeletal:  Positive for back pain and joint pain.  Neurological:  Negative for dizziness, tingling, focal weakness, weakness and headaches.  Endo/Heme/Allergies:  Does not bruise/bleed easily.  Psychiatric/Behavioral:  Negative for depression. The patient is not nervous/anxious and does not have insomnia.       PAST MEDICAL HISTORY :  Past Medical History:  Diagnosis Date   Arthritis     Cancer of lower-outer quadrant of female breast (HCC) 08/06/2011   RIGHT, chemo and bilateral mastectomies.   High cholesterol    History of kidney stones    Hx of bilateral breast implants    Hypertension    Lung metastases 20118   chemo pills and faslidex shots.   PONV (postoperative nausea and vomiting)    Type II diabetes mellitus (HCC)    fasting 140-150   Vertigo    LAST WEEK   Vertigo 01/2017    PAST SURGICAL HISTORY :   Past Surgical History:  Procedure Laterality Date   BREAST BIOPSY  07/2011, 2020   right   BREAST RECONSTRUCTION  03/07/2012   Procedure: BREAST RECONSTRUCTION;  Surgeon: Etter Sjogren, MD;  Location: [facility] OR;  Service: Plastics;  Laterality: Left;  BREAST RECONSTRUCTION WITH PLACEMENT OF TISSUE EXPANDER TO LEFT BREAST   BREAST RECONSTRUCTION  02/06/2013   CESAREAN SECTION  [id number]   ENDOBRONCHIAL ULTRASOUND N/A 02/24/2017   Procedure: ENDOBRONCHIAL ULTRASOUND;  Surgeon: Erin Fulling, MD;  Location: [facility] ORS;  Service: Cardiopulmonary;  Laterality: N/A;   LATISSIMUS FLAP TO BREAST Right 02/06/2013   Procedure: LATISSIMUS FLAP TO RIGHT BREAST WITH IMPLANT;  Surgeon: Etter Sjogren, MD;  Location: [facility] OR;  Service: Plastics;  Laterality: Right;   MASTECTOMY  03/07/12   modified right; total left   MODIFIED MASTECTOMY  03/07/2012   Procedure: MODIFIED MASTECTOMY;  Surgeon: Currie Paris, MD;  Location: [facility] OR;  Service: General;  Laterality: Right;   PORTACATH PLACEMENT  07/2011   SCAR REVISION  03/30/2012   Procedure: SCAR REVISION;  Surgeon: Currie Paris, MD;  Location: [facility];  Service: General;  Laterality: Right;  CLOSURE OF MASTECTOMY INCISION   WISDOM TOOTH EXTRACTION      FAMILY HISTORY :   Family History  Problem Relation Age of Onset   Breast cancer Mother    Diabetes Father     SOCIAL HISTORY:   Social History   Tobacco Use   Smoking status: Never   Smokeless tobacco: Never  Vaping Use   Vaping Use: Never used   Substance Use Topics   Alcohol use: No   Drug use: No    ALLERGIES:  is allergic to sulfa antibiotics.  MEDICATIONS:  Current Outpatient Medications  Medication Sig Dispense Refill   anastrozole (ARIMIDEX) 1 MG tablet TAKE 1 TABLET BY MOUTH EVERY DAY 90 tablet 2   aspirin EC 81 MG tablet Take 81 mg by mouth daily.     atorvastatin (LIPITOR) 20 MG tablet Take 1 tablet (20 mg total) by mouth daily. 90 tablet 3   Calcium Carb-Cholecalciferol 500-10 MG-MCG TABS Take by mouth.     Cholecalciferol 50 MCG (2000 UT) CAPS Take 3 capsules daily for 3 months, then reduce to 1 capsule daily thereafter for Vitamin D Deficiency.     clotrimazole-betamethasone (LOTRISONE) cream SMARTSIG:Topical Morning-Evening     Cranberry-Vitamin C-Probiotic (AZO CRANBERRY PO) Take 1 Capful by mouth daily at 12 noon.     cyanocobalamin (VITAMIN B12) 1000 MCG tablet 1,000 mcg daily. Take 2 tablets daily for 2 weeks, then reduce to 1 tablet daily thereafter for Vitamin B12 Deficiency.     glipiZIDE (GLUCOTROL XL) 10 MG 24 hr tablet Take 1 tablet (10 mg total) by mouth 2 (two) times daily. 180 tablet 3   insulin aspart (NOVOLOG FLEXPEN) 100 UNIT/ML FlexPen Inject 4 Units into the skin  3 (three) times daily with meals. 15 mL 0   Insulin Detemir (LEVEMIR) 100 UNIT/ML Pen Inject 20 Units into the skin at bedtime. 15 mL 0   levothyroxine (SYNTHROID) 25 MCG tablet Take 1 tablet (25 mcg total) by mouth daily before breakfast. 90 tablet 1   lisinopril (ZESTRIL) 10 MG tablet Take 10 mg by mouth daily.     loperamide (IMODIUM A-D) 2 MG tablet Take 2 mg by mouth 4 (four) times daily as needed for diarrhea or loose stools.     magnesium oxide (MAG-OX) 400 MG tablet Take 1 tablet by mouth daily.     Melatonin 10 MG TABS Take 1 tablet by mouth at bedtime as needed.     ondansetron (ZOFRAN-ODT) 4 MG disintegrating tablet Take 1 tablet (4 mg total) by mouth every 8 (eight) hours as needed for nausea or vomiting. 30 tablet 3    TRULICITY 0.75 MG/0.5ML SOPN SMARTSIG:0.5 Milliliter(s) SUB-Q Once a Week     abemaciclib (VERZENIO) 100 MG tablet TAKE 1 TABLET BY MOUTH TWICE DAILY. DO NOT CHEW, CRUSH, OR SPLIT TABLETS BEFORE SWALLOWING 56 tablet 5   albuterol (VENTOLIN HFA) 108 (90 Base) MCG/ACT inhaler Inhale 1-2 puffs into the lungs every 6 (six) hours as needed for wheezing or shortness of breath. (Patient not taking: Reported on 02/03/2023) 8 g 2   carvedilol (COREG) 6.25 MG tablet Take 1 tablet (6.25 mg total) by mouth 2 (two) times daily with a meal. (Patient not taking: Reported on 02/24/2023) 60 tablet 3   potassium chloride (KLOR-CON M) 10 MEQ tablet Take 1 tablet (10 mEq total) by mouth daily. (Patient not taking: Reported on 02/03/2023) 7 tablet 0   No current facility-administered medications for this visit.    PHYSICAL EXAMINATION: ECOG PERFORMANCE STATUS: 1 - Symptomatic but completely ambulatory  BP 108/61 (BP Location: Left Arm, Patient Position: Sitting, Cuff Size: Normal)   Pulse (!) 110   Temp (!) 97.5 F (36.4 C) (Tympanic)   Ht 5' 2.5" (1.588 m)   Wt 154 lb 9.6 oz (70.1 kg)   SpO2 99%   BMI 27.83 kg/m   Filed Weights   02/24/23 0923  Weight: 154 lb 9.6 oz (70.1 kg)       Physical Exam Constitutional:      Comments: She is alone.   HENT:     Head: Normocephalic and atraumatic.     Mouth/Throat:     Pharynx: No oropharyngeal exudate.  Eyes:     Pupils: Pupils are equal, round, and reactive to light.  Cardiovascular:     Rate and Rhythm: Normal rate and regular rhythm.  Pulmonary:     Effort: Pulmonary effort is normal. No respiratory distress.     Breath sounds: Normal breath sounds. No wheezing.  Abdominal:     General: Bowel sounds are normal. There is no distension.     Palpations: Abdomen is soft. There is no mass.     Tenderness: There is no abdominal tenderness. There is no guarding or rebound.  Musculoskeletal:        General: No tenderness. Normal range of motion.      Cervical back: Normal range of motion and neck supple.     Comments: Approximately 1 cm hard nodule felt in the anterior chest wall/left parasternal.  Skin:    General: Skin is warm.  Neurological:     Mental Status: She is alert and oriented to person, place, and time.  Psychiatric:  Mood and Affect: Affect normal.    LABORATORY DATA:  I have reviewed the data as listed    Component Value Date/Time   NA 135 02/24/2023 0924   NA 135 11/28/2014 1401   K 3.6 02/24/2023 0924   K 4.7 11/28/2014 1401   CL 106 02/24/2023 0924   CL 99 (L) 11/28/2014 1401   CO2 20 (L) 02/24/2023 0924   CO2 28 11/28/2014 1401   GLUCOSE 130 (H) 02/24/2023 0924   GLUCOSE 215 (H) 11/28/2014 1401   BUN 15 02/24/2023 0924   BUN 16 11/28/2014 1401   CREATININE 1.11 (H) 02/24/2023 0924   CREATININE 0.91 02/03/2023 0928   CREATININE 0.96 11/28/2014 1401   CALCIUM 9.9 02/24/2023 0924   CALCIUM 9.9 11/28/2014 1401   PROT 6.9 02/24/2023 0924   PROT 7.6 11/28/2014 1401   ALBUMIN 3.6 02/24/2023 0924   ALBUMIN 4.5 11/28/2014 1401   AST 40 02/24/2023 0924   AST 46 (H) 02/03/2023 0928   ALT 28 02/24/2023 0924   ALT 30 02/03/2023 0928   ALT 28 11/28/2014 1401   ALKPHOS 71 02/24/2023 0924   ALKPHOS 78 11/28/2014 1401   BILITOT 0.6 02/24/2023 0924   BILITOT 0.6 02/03/2023 0928   GFRNONAA 54 (L) 02/24/2023 0924   GFRNONAA >60 02/03/2023 0928   GFRNONAA >60 11/28/2014 1401   GFRAA >60 05/30/2020 1022   GFRAA >60 11/28/2014 1401    No results found for: "SPEP", "UPEP"  Lab Results  Component Value Date   WBC 3.5 (L) 02/24/2023   NEUTROABS 1.2 (L) 02/24/2023   HGB 10.9 (L) 02/24/2023   HCT 32.2 (L) 02/24/2023   MCV 95.8 02/24/2023   PLT 181 02/24/2023      Chemistry      Component Value Date/Time   NA 135 02/24/2023 0924   NA 135 11/28/2014 1401   K 3.6 02/24/2023 0924   K 4.7 11/28/2014 1401   CL 106 02/24/2023 0924   CL 99 (L) 11/28/2014 1401   CO2 20 (L) 02/24/2023 0924   CO2 28  11/28/2014 1401   BUN 15 02/24/2023 0924   BUN 16 11/28/2014 1401   CREATININE 1.11 (H) 02/24/2023 0924   CREATININE 0.91 02/03/2023 0928   CREATININE 0.96 11/28/2014 1401      Component Value Date/Time   CALCIUM 9.9 02/24/2023 0924   CALCIUM 9.9 11/28/2014 1401   ALKPHOS 71 02/24/2023 0924   ALKPHOS 78 11/28/2014 1401   AST 40 02/24/2023 0924   AST 46 (H) 02/03/2023 0928   ALT 28 02/24/2023 0924   ALT 30 02/03/2023 0928   ALT 28 11/28/2014 1401   BILITOT 0.6 02/24/2023 0924   BILITOT 0.6 02/03/2023 0928       RADIOGRAPHIC STUDIES: I have personally reviewed the radiological images as listed and agreed with the findings in the report. No results found.   ASSESSMENT & PLAN:  Carcinoma of lower-outer quadrant of right breast in female, estrogen receptor positive (HCC)  # Recurrent breast cancer-ER positive PR negative ? HER-2/neu-currently on abema+ Anastrozole. JAN 2024-Discontinued faslodex sec to insurance issues.]  MARCH 15th, 2023- CT [facility]-  No definitive evidence to suggest recurrent or metastatic disease in the chest, abdomen or pelvis. Stable.   # Continue Anastrazole [JAN Mid 2024-] for now. Re-Start Abema [100 BID; off weekends].Labs today-ANC- 1.2 Hb-11; platelets-N. Will repeat imaging in 34month; ordered today.   # Sinus Tachycardia- recommend/reminded to start low dose coreg 6.125 BID. Script sent-  stable.   # Bilateral  itchy watery eyes and sneezing. No improvement from Montelukast-  stable.   # Hyponatremia [131]/CKD stage III- sec to poor hydration/diarrhea- ? abema- recommend increasing fluids/salt intake electrolytes. stable.   # Hypomagnesia: 1.4- on OTC mag-stable.   # DM- BG-13- s/p endocrinology evaluation; with diet and medications [trucilicity; Novolog]- stable.   # recurrent UTI/ ? Suprapubic tenderness- S/p eval with urology.  Monitor closely on Abema/with neutropenia- awaiting evaluation with Consuello Masse re: vaginal atrophy today- stable.  #  intermittent Hypercalcemia: Ca 10.4- monitor for now. FEB Vit D - 90; HOLD off calcium and Vit D- stable.  # Low B 12 [PCP]- on B12 PO. stable.  # IV access; PIV.   #DISPOSITION: **early AM appt # Follow up in 4 weeks- MD; labs [CBC/CMP/b12; mag; Ca 27-29;]- CT [facility]- Dr.B  Orders Placed This Encounter  Procedures   CT CHEST ABDOMEN PELVIS W CONTRAST    Standing Status:   Future    Standing Expiration Date:   02/24/2024    Order Specific Question:   If indicated for the ordered procedure, I authorize the administration of contrast media per Radiology protocol    Answer:   Yes    Order Specific Question:   Does the patient have a contrast media/X-ray dye allergy?    Answer:   No    Order Specific Question:   Preferred imaging location?    Answer:   [facility]    Order Specific Question:   If indicated for the ordered procedure, I authorize the administration of oral contrast media per Radiology protocol    Answer:   Yes   CBC with Differential (Cancer Center Only)    Standing Status:   Future    Standing Expiration Date:   02/24/2024   CMP (Cancer Center only)    Standing Status:   Future    Standing Expiration Date:   02/24/2024   Vitamin B12    Standing Status:   Future    Standing Expiration Date:   02/24/2024   Magnesium    Standing Status:   Future    Standing Expiration Date:   02/24/2024   Cancer antigen 27.29    Standing Status:   Future    Standing Expiration Date:   02/24/2024   All questions were answered. The patient knows to call the clinic with any problems, questions or concerns.      Earna Coder, MD 02/24/2023 10:48 AM

## 2023-02-25 LAB — VITAMIN B12: Vitamin B-12: 774 pg/mL (ref 180–914)

## 2023-02-26 LAB — CANCER ANTIGEN 27.29: CA 27.29: 25.6 U/mL (ref 0.0–38.6)

## 2023-03-11 ENCOUNTER — Encounter: Payer: Self-pay | Admitting: Internal Medicine

## 2023-03-24 ENCOUNTER — Encounter: Payer: Self-pay | Admitting: Internal Medicine

## 2023-03-24 ENCOUNTER — Ambulatory Visit
Admission: RE | Admit: 2023-03-24 | Discharge: 2023-03-24 | Disposition: A | Discharge status: Home / Self Care | Payer: PPO | Source: Ambulatory Visit | Attending: Internal Medicine | Admitting: Internal Medicine

## 2023-03-24 DIAGNOSIS — C50919 Malignant neoplasm of unspecified site of unspecified female breast: Secondary | ICD-10-CM | POA: Diagnosis not present

## 2023-03-24 DIAGNOSIS — K5730 Diverticulosis of large intestine without perforation or abscess without bleeding: Secondary | ICD-10-CM | POA: Diagnosis not present

## 2023-03-24 DIAGNOSIS — Z170 Estrogen receptor positive status [ER+]: Secondary | ICD-10-CM | POA: Diagnosis not present

## 2023-03-24 DIAGNOSIS — C50511 Malignant neoplasm of lower-outer quadrant of right female breast: Secondary | ICD-10-CM | POA: Insufficient documentation

## 2023-03-24 DIAGNOSIS — N281 Cyst of kidney, acquired: Secondary | ICD-10-CM | POA: Diagnosis not present

## 2023-03-24 DIAGNOSIS — R918 Other nonspecific abnormal finding of lung field: Secondary | ICD-10-CM | POA: Diagnosis not present

## 2023-03-24 MED ORDER — IOHEXOL 300 MG/ML  SOLN
80.0000 mL | Freq: Once | INTRAMUSCULAR | Status: AC | PRN
  Administered 2023-03-24: 80 mL via INTRAVENOUS

## 2023-03-28 DIAGNOSIS — N1831 Chronic kidney disease, stage 3a: Secondary | ICD-10-CM | POA: Diagnosis not present

## 2023-03-28 DIAGNOSIS — Z794 Long term (current) use of insulin: Secondary | ICD-10-CM | POA: Diagnosis not present

## 2023-03-28 DIAGNOSIS — M8589 Other specified disorders of bone density and structure, multiple sites: Secondary | ICD-10-CM | POA: Diagnosis not present

## 2023-03-28 DIAGNOSIS — E1169 Type 2 diabetes mellitus with other specified complication: Secondary | ICD-10-CM | POA: Diagnosis not present

## 2023-03-28 DIAGNOSIS — E538 Deficiency of other specified B group vitamins: Secondary | ICD-10-CM | POA: Diagnosis not present

## 2023-03-28 DIAGNOSIS — Z23 Encounter for immunization: Secondary | ICD-10-CM | POA: Diagnosis not present

## 2023-03-28 DIAGNOSIS — I152 Hypertension secondary to endocrine disorders: Secondary | ICD-10-CM | POA: Diagnosis not present

## 2023-03-28 DIAGNOSIS — E1122 Type 2 diabetes mellitus with diabetic chronic kidney disease: Secondary | ICD-10-CM | POA: Diagnosis not present

## 2023-03-28 DIAGNOSIS — E1159 Type 2 diabetes mellitus with other circulatory complications: Secondary | ICD-10-CM | POA: Diagnosis not present

## 2023-03-28 DIAGNOSIS — E559 Vitamin D deficiency, unspecified: Secondary | ICD-10-CM | POA: Diagnosis not present

## 2023-03-28 DIAGNOSIS — G620 Drug-induced polyneuropathy: Secondary | ICD-10-CM | POA: Diagnosis not present

## 2023-03-28 DIAGNOSIS — Z09 Encounter for follow-up examination after completed treatment for conditions other than malignant neoplasm: Secondary | ICD-10-CM | POA: Diagnosis not present

## 2023-03-28 DIAGNOSIS — N182 Chronic kidney disease, stage 2 (mild): Secondary | ICD-10-CM | POA: Diagnosis not present

## 2023-03-30 ENCOUNTER — Inpatient Hospital Stay: Payer: PPO | Attending: Internal Medicine

## 2023-03-30 ENCOUNTER — Encounter: Payer: Self-pay | Admitting: Internal Medicine

## 2023-03-30 ENCOUNTER — Inpatient Hospital Stay: Payer: PPO | Admitting: Internal Medicine

## 2023-03-30 VITALS — BP 104/69 | HR 78 | Temp 95.3°F | Ht 62.5 in | Wt 157.8 lb

## 2023-03-30 DIAGNOSIS — C7800 Secondary malignant neoplasm of unspecified lung: Secondary | ICD-10-CM | POA: Diagnosis not present

## 2023-03-30 DIAGNOSIS — Z79899 Other long term (current) drug therapy: Secondary | ICD-10-CM | POA: Insufficient documentation

## 2023-03-30 DIAGNOSIS — E119 Type 2 diabetes mellitus without complications: Secondary | ICD-10-CM | POA: Insufficient documentation

## 2023-03-30 DIAGNOSIS — E871 Hypo-osmolality and hyponatremia: Secondary | ICD-10-CM | POA: Diagnosis not present

## 2023-03-30 DIAGNOSIS — Z7985 Long-term (current) use of injectable non-insulin antidiabetic drugs: Secondary | ICD-10-CM | POA: Insufficient documentation

## 2023-03-30 DIAGNOSIS — Z170 Estrogen receptor positive status [ER+]: Secondary | ICD-10-CM | POA: Insufficient documentation

## 2023-03-30 DIAGNOSIS — C50511 Malignant neoplasm of lower-outer quadrant of right female breast: Secondary | ICD-10-CM

## 2023-03-30 DIAGNOSIS — Z86718 Personal history of other venous thrombosis and embolism: Secondary | ICD-10-CM | POA: Diagnosis not present

## 2023-03-30 DIAGNOSIS — Z79811 Long term (current) use of aromatase inhibitors: Secondary | ICD-10-CM | POA: Diagnosis not present

## 2023-03-30 DIAGNOSIS — Z7984 Long term (current) use of oral hypoglycemic drugs: Secondary | ICD-10-CM | POA: Insufficient documentation

## 2023-03-30 DIAGNOSIS — Z7982 Long term (current) use of aspirin: Secondary | ICD-10-CM | POA: Insufficient documentation

## 2023-03-30 DIAGNOSIS — Z9013 Acquired absence of bilateral breasts and nipples: Secondary | ICD-10-CM | POA: Diagnosis not present

## 2023-03-30 DIAGNOSIS — Z794 Long term (current) use of insulin: Secondary | ICD-10-CM | POA: Insufficient documentation

## 2023-03-30 LAB — CMP (CANCER CENTER ONLY)
ALT: 22 U/L (ref 0–44)
AST: 31 U/L (ref 15–41)
Albumin: 3.1 g/dL — ABNORMAL LOW (ref 3.5–5.0)
Alkaline Phosphatase: 60 U/L (ref 38–126)
Anion gap: 6 (ref 5–15)
BUN: 20 mg/dL (ref 8–23)
CO2: 22 mmol/L (ref 22–32)
Calcium: 9.3 mg/dL (ref 8.9–10.3)
Chloride: 108 mmol/L (ref 98–111)
Creatinine: 1.26 mg/dL — ABNORMAL HIGH (ref 0.44–1.00)
GFR, Estimated: 47 mL/min — ABNORMAL LOW (ref >60)
Glucose, Bld: 81 mg/dL (ref 70–99)
Potassium: 3.7 mmol/L (ref 3.5–5.1)
Sodium: 136 mmol/L (ref 135–145)
Total Bilirubin: 0.3 mg/dL (ref 0.3–1.2)
Total Protein: 6.5 g/dL (ref 6.5–8.1)

## 2023-03-30 LAB — CBC WITH DIFFERENTIAL (CANCER CENTER ONLY)
Abs Immature Granulocytes: 0.02 K/uL (ref 0.00–0.07)
Basophils Absolute: 0.1 K/uL (ref 0.0–0.1)
Basophils Relative: 2 %
Eosinophils Absolute: 0.1 K/uL (ref 0.0–0.5)
Eosinophils Relative: 2 %
HCT: 28 % — ABNORMAL LOW (ref 36.0–46.0)
Hemoglobin: 9.4 g/dL — ABNORMAL LOW (ref 12.0–15.0)
Immature Granulocytes: 1 %
Lymphocytes Relative: 48 %
Lymphs Abs: 1.4 K/uL (ref 0.7–4.0)
MCH: 32.6 pg (ref 26.0–34.0)
MCHC: 33.6 g/dL (ref 30.0–36.0)
MCV: 97.2 fL (ref 80.0–100.0)
Monocytes Absolute: 0.2 K/uL (ref 0.1–1.0)
Monocytes Relative: 7 %
Neutro Abs: 1.2 K/uL — ABNORMAL LOW (ref 1.7–7.7)
Neutrophils Relative %: 40 %
Platelet Count: 216 K/uL (ref 150–400)
RBC: 2.88 MIL/uL — ABNORMAL LOW (ref 3.87–5.11)
RDW: 15.3 % (ref 11.5–15.5)
WBC Count: 2.9 K/uL — ABNORMAL LOW (ref 4.0–10.5)
nRBC: 0 % (ref 0.0–0.2)

## 2023-03-30 LAB — MAGNESIUM: Magnesium: 1.7 mg/dL (ref 1.7–2.4)

## 2023-03-30 LAB — VITAMIN B12: Vitamin B-12: 1457 pg/mL — ABNORMAL HIGH (ref 180–914)

## 2023-03-30 NOTE — Assessment & Plan Note (Addendum)
#   Recurrent breast cancer-ER positive PR negative ? HER-2/neu-currently on abema+ Anastrozole. JAN 2024-Discontinued faslodex sec to insurance issues.]  JULY 2024-  New small nodule of the superior segment right lower lobe measuring 0.7 cm, possibly infectious or inflammatory but suspicious for a pulmonary metastasis.  No evidence of lymphadenopathy or metastatic disease in the abdomen or pelvis.  # Continue Anastrazole [JAN Mid 2024-] for now. Re-Start Abema [100 BID; off weekends].Labs today-ANC- 1.2 Hb-11; platelets-N. Will repeat imaging in 81month; ordered today.   # incidental CT JULy 2024- Distal transverse, descending, and sigmoid diverticulosis, with fat stranding about the splenic flexure in the left upper quadrant. Correlate for signs and symptoms of acute diverticulitis- no symtoms; monitor for now.    # Sinus Tachycardia- recommend/reminded to start low dose coreg 6.125 BID. Script sent-  stable.  # Bilateral itchy watery eyes and sneezing. No improvement from Montelukast-  stable.  # Hyponatremia [131]/CKD stage III- sec to poor hydration/diarrhea- ? abema- recommend increasing fluids/salt intake electrolytes.stable.  # Hypomagnesia: 1.4- on OTC mag-stable.  # DM- BG-13- s/p endocrinology evaluation; with diet and medications [trucilicity; Novolog]- stable.  # recurrent UTI/ ? Suprapubic tenderness- S/p eval with urology.  Monitor closely on Abema/with neutropenia- s/p evaluation with Consuello Masse re: vaginal atrophy today- stable.  # intermittent Hypercalcemia: Ca 10.4- monitor for now. FEB Vit D - 90; HOLD off calcium and Vit D- stable.  # Low B 12 [PCP]- on B12 PO. stable.  #Incidental findings on Imaging  CT , 2024:    Hepatic steatosis; Cholelithiasis; Coronary artery disease; reviewed/discussed/ counseled the patient.   # IV access; PIV.   #DISPOSITION:  AM appt pref # Follow up in aug 26th/4 weeks- MD; labs [CBC/CMP/b12; mag; Ca 27-29;iron studies; ferritin]- Dr.B  #  I reviewed the blood work- with the patient in detail; also reviewed the imaging independently [as summarized above]; and with the patient in detail.

## 2023-03-30 NOTE — Addendum Note (Signed)
Addended by: Clydia Llano on: 03/30/2023 03:53 PM   Modules accepted: Orders

## 2023-03-30 NOTE — Progress Notes (Signed)
[name] OFFICE PROGRESS NOTE  Patient Care Team: Luciana Axe, NP as PCP - General (Family Medicine) Cyndia Bent, MD (Inactive) (General Surgery) Harold Hedge, MD (Obstetrics and Gynecology) Carmina Miller, MD Johney Maine, MD (Unknown Physician Specialty) Earna Coder, MD as Consulting Physician (Hematology and Oncology)   Cancer Staging  Carcinoma of lower-outer quadrant of right breast in female, estrogen receptor positive [facility]) Staging form: Breast, AJCC 7th Edition - Pathologic: ypT1c,ypN2a, MX - Signed by Currie Paris, MD on 03/10/2012 Cancer stage: ypT1c,ypN2a, MX    Oncology History Overview Note  # DEC 2012- RIGHT BREAST CA T2 N2 M0 tumor from biopsy.  Estrogen receptor positive, Progesterone receptor positive.  Current receptor negative by FISH 2. Neoadjuvant chemotherapy started in December of 28 with Cytoxan Adriamycin 3. Started on Taxol weekly chemotherapy. 4. Patient finished 12  cycles of Taxol chemotherapy in May of 2013.     5. Status post right modified radical mastectomy [Dr.Bowers; [place]] June of 2013, ypT1c  yp N2  MO. started also on Lerazole    7. Radiation therapy to the right breast (September of 2013).  Lymph node was positive for HER-2 receptor gene amplification of 2.22.  Will proceed with Herceptin treatment starting in September of 2013.   8.Patient has finished Herceptin (maintenance therapy) in August of 2014 8. Start patient on letrozole from November, 2013. 9. Patient started on Herceptin in September 2013.    10Patient finished 12 months of Herceptin therapy on August, 2014  # 6. Status post left side prophylactic mastectomy.  #Late MAY 2018-RECURRENCE BREAST CA- ER positive/PR negative; ?? HER-2/neu- [biopsy- proven-mediastinal lymph node; in suff for her 2 testing].  [elevated Tumor marker- CT/PET- uptake in Right Mediastinal LN; Sternum [June 2018 EBUS- Dr.Kasa]   # March 10 2017- faslodex +  Abema; OCT 5th CT-PR of mediastinal LN [consent]; Discontinued faslodex sec to insurance issues [last DEC 2023]. JAN 2024- continue Abema + Anastrzole   #August 2020-left chest wall nodule biopsy benign  #August 2019- DVT-right upper extremity; Eliquis; stop end of March 2021 [repeat ultrasound negative/patient preference]; # JAN 2022-Cirrhosis- ? On CT scan-FEB liver- Negative fro overt cirrhosis --------------------------------------------------------------- -    DIAGNOSIS: [ BREAST CANCER- ER/PR/HER2 NEU POS  STAGE:  4   ;GOALS: PALLIATIVE     Carcinoma of lower-outer quadrant of right breast in female, estrogen receptor positive (HCC)   INTERVAL HISTORY: Alone.  Ambulating independently.  Kimberly [name] 67 y.o.  female pleasant patient above history of metastatic ER PR positive HER-2/neu positive breast cancer currently on abema+ Anastrazole  is here for follow-up/ CT results.  No nausea or vomiting or abdominal pain. No worsening diarrhea.  Appetite is good. Fatigued. no fever no chills. No bladder infections.   Review of Systems  Constitutional:  Positive for malaise/fatigue. Negative for chills, diaphoresis, fever and weight loss.  HENT:  Negative for nosebleeds and sore throat.   Eyes:  Negative for double vision.  Respiratory:  Negative for cough, hemoptysis, sputum production, shortness of breath and wheezing.   Cardiovascular:  Negative for chest pain, palpitations, orthopnea and leg swelling.  Gastrointestinal:  Negative for abdominal pain, blood in stool, constipation, heartburn, melena, nausea and vomiting.  Musculoskeletal:  Positive for back pain and joint pain.  Neurological:  Negative for dizziness, tingling, focal weakness, weakness and headaches.  Endo/Heme/Allergies:  Does not bruise/bleed easily.  Psychiatric/Behavioral:  Negative for depression. The patient is not nervous/anxious and does not have insomnia.  PAST MEDICAL HISTORY :  Past  Medical History:  Diagnosis Date   Arthritis    Cancer of lower-outer quadrant of female breast (HCC) 08/06/2011   RIGHT, chemo and bilateral mastectomies.   High cholesterol    History of kidney stones    Hx of bilateral breast implants    Hypertension    Lung metastases 20118   chemo pills and faslidex shots.   PONV (postoperative nausea and vomiting)    Type II diabetes mellitus (HCC)    fasting 140-150   Vertigo    LAST WEEK   Vertigo 01/2017    PAST SURGICAL HISTORY :   Past Surgical History:  Procedure Laterality Date   BREAST BIOPSY  07/2011, 2020   right   BREAST RECONSTRUCTION  03/07/2012   Procedure: BREAST RECONSTRUCTION;  Surgeon: Etter Sjogren, MD;  Location: [facility] OR;  Service: Plastics;  Laterality: Left;  BREAST RECONSTRUCTION WITH PLACEMENT OF TISSUE EXPANDER TO LEFT BREAST   BREAST RECONSTRUCTION  02/06/2013   CESAREAN SECTION  1191,4782   ENDOBRONCHIAL ULTRASOUND N/A 02/24/2017   Procedure: ENDOBRONCHIAL ULTRASOUND;  Surgeon: Erin Fulling, MD;  Location: [facility] ORS;  Service: Cardiopulmonary;  Laterality: N/A;   LATISSIMUS FLAP TO BREAST Right 02/06/2013   Procedure: LATISSIMUS FLAP TO RIGHT BREAST WITH IMPLANT;  Surgeon: Etter Sjogren, MD;  Location: [facility] OR;  Service: Plastics;  Laterality: Right;   MASTECTOMY  03/07/12   modified right; total left   MODIFIED MASTECTOMY  03/07/2012   Procedure: MODIFIED MASTECTOMY;  Surgeon: Currie Paris, MD;  Location: [facility] OR;  Service: General;  Laterality: Right;   PORTACATH PLACEMENT  07/2011   SCAR REVISION  03/30/2012   Procedure: SCAR REVISION;  Surgeon: Currie Paris, MD;  Location: [facility];  Service: General;  Laterality: Right;  CLOSURE OF MASTECTOMY INCISION   WISDOM TOOTH EXTRACTION      FAMILY HISTORY :   Family History  Problem Relation Age of Onset   Breast cancer Mother    Diabetes Father     SOCIAL HISTORY:   Social History   Tobacco Use   Smoking status: Never   Smokeless tobacco:  Never  Vaping Use   Vaping status: Never Used  Substance Use Topics   Alcohol use: No   Drug use: No    ALLERGIES:  is allergic to sulfa antibiotics.  MEDICATIONS:  Current Outpatient Medications  Medication Sig Dispense Refill   abemaciclib (VERZENIO) 100 MG tablet TAKE 1 TABLET BY MOUTH TWICE DAILY. DO NOT CHEW, CRUSH, OR SPLIT TABLETS BEFORE SWALLOWING 56 tablet 5   anastrozole (ARIMIDEX) 1 MG tablet TAKE 1 TABLET BY MOUTH EVERY DAY 90 tablet 2   aspirin EC 81 MG tablet Take 81 mg by mouth daily.     atorvastatin (LIPITOR) 20 MG tablet Take 1 tablet (20 mg total) by mouth daily. 90 tablet 3   Calcium Carb-Cholecalciferol 500-10 MG-MCG TABS Take by mouth.     Cholecalciferol 50 MCG (2000 UT) CAPS Take 3 capsules daily for 3 months, then reduce to 1 capsule daily thereafter for Vitamin D Deficiency.     clotrimazole-betamethasone (LOTRISONE) cream SMARTSIG:Topical Morning-Evening     Cranberry-Vitamin C-Probiotic (AZO CRANBERRY PO) Take 1 Capful by mouth daily at 12 noon.     cyanocobalamin (VITAMIN B12) 1000 MCG tablet 1,000 mcg daily. Take 2 tablets daily for 2 weeks, then reduce to 1 tablet daily thereafter for Vitamin B12 Deficiency.     glipiZIDE (GLUCOTROL XL) 10 MG 24 hr  tablet Take 1 tablet (10 mg total) by mouth 2 (two) times daily. 180 tablet 3   insulin aspart (NOVOLOG FLEXPEN) 100 UNIT/ML FlexPen Inject 4 Units into the skin 3 (three) times daily with meals. 15 mL 0   Insulin Detemir (LEVEMIR) 100 UNIT/ML Pen Inject 20 Units into the skin at bedtime. 15 mL 0   levothyroxine (SYNTHROID) 25 MCG tablet Take 1 tablet (25 mcg total) by mouth daily before breakfast. 90 tablet 1   lisinopril (ZESTRIL) 10 MG tablet Take 10 mg by mouth daily.     loperamide (IMODIUM A-D) 2 MG tablet Take 2 mg by mouth 4 (four) times daily as needed for diarrhea or loose stools.     magnesium oxide (MAG-OX) 400 MG tablet Take 1 tablet by mouth daily.     Melatonin 10 MG TABS Take 1 tablet by mouth at  bedtime as needed.     ondansetron (ZOFRAN-ODT) 4 MG disintegrating tablet Take 1 tablet (4 mg total) by mouth every 8 (eight) hours as needed for nausea or vomiting. 30 tablet 3   TRULICITY 0.75 MG/0.5ML SOPN SMARTSIG:0.5 Milliliter(s) SUB-Q Once a Week     albuterol (VENTOLIN HFA) 108 (90 Base) MCG/ACT inhaler Inhale 1-2 puffs into the lungs every 6 (six) hours as needed for wheezing or shortness of breath. (Patient not taking: Reported on 02/03/2023) 8 g 2   carvedilol (COREG) 6.25 MG tablet Take 1 tablet (6.25 mg total) by mouth 2 (two) times daily with a meal. (Patient not taking: Reported on 02/24/2023) 60 tablet 3   potassium chloride (KLOR-CON M) 10 MEQ tablet Take 1 tablet (10 mEq total) by mouth daily. (Patient not taking: Reported on 02/03/2023) 7 tablet 0   No current facility-administered medications for this visit.    PHYSICAL EXAMINATION: ECOG PERFORMANCE STATUS: 1 - Symptomatic but completely ambulatory  BP 104/69 (BP Location: Left Arm, Patient Position: Sitting, Cuff Size: Normal)   Pulse 78   Temp (!) 95.3 F (35.2 C) (Tympanic)   Ht 5' 2.5" (1.588 m)   Wt 157 lb 12.8 oz (71.6 kg)   SpO2 98%   BMI 28.40 kg/m   Filed Weights   03/30/23 1440  Weight: 157 lb 12.8 oz (71.6 kg)        Physical Exam Constitutional:      Comments: She is alone.   HENT:     Head: Normocephalic and atraumatic.     Mouth/Throat:     Pharynx: No oropharyngeal exudate.  Eyes:     Pupils: Pupils are equal, round, and reactive to light.  Cardiovascular:     Rate and Rhythm: Normal rate and regular rhythm.  Pulmonary:     Effort: Pulmonary effort is normal. No respiratory distress.     Breath sounds: Normal breath sounds. No wheezing.  Abdominal:     General: Bowel sounds are normal. There is no distension.     Palpations: Abdomen is soft. There is no mass.     Tenderness: There is no abdominal tenderness. There is no guarding or rebound.  Musculoskeletal:        General: No  tenderness. Normal range of motion.     Cervical back: Normal range of motion and neck supple.     Comments: Approximately 1 cm hard nodule felt in the anterior chest wall/left parasternal.  Skin:    General: Skin is warm.  Neurological:     Mental Status: She is alert and oriented to person, place, and time.  Psychiatric:  Mood and Affect: Affect normal.    LABORATORY DATA:  I have reviewed the data as listed    Component Value Date/Time   NA 136 03/30/2023 1453   NA 135 11/28/2014 1401   K 3.7 03/30/2023 1453   K 4.7 11/28/2014 1401   CL 108 03/30/2023 1453   CL 99 (L) 11/28/2014 1401   CO2 22 03/30/2023 1453   CO2 28 11/28/2014 1401   GLUCOSE 81 03/30/2023 1453   GLUCOSE 215 (H) 11/28/2014 1401   BUN 20 03/30/2023 1453   BUN 16 11/28/2014 1401   CREATININE 1.26 (H) 03/30/2023 1453   CREATININE 0.96 11/28/2014 1401   CALCIUM 9.3 03/30/2023 1453   CALCIUM 9.9 11/28/2014 1401   PROT 6.5 03/30/2023 1453   PROT 7.6 11/28/2014 1401   ALBUMIN 3.1 (L) 03/30/2023 1453   ALBUMIN 4.5 11/28/2014 1401   AST 31 03/30/2023 1453   ALT 22 03/30/2023 1453   ALT 28 11/28/2014 1401   ALKPHOS 60 03/30/2023 1453   ALKPHOS 78 11/28/2014 1401   BILITOT 0.3 03/30/2023 1453   GFRNONAA 47 (L) 03/30/2023 1453   GFRNONAA >60 11/28/2014 1401   GFRAA >60 05/30/2020 1022   GFRAA >60 11/28/2014 1401    No results found for: "SPEP", "UPEP"  Lab Results  Component Value Date   WBC 2.9 (L) 03/30/2023   NEUTROABS 1.2 (L) 03/30/2023   HGB 9.4 (L) 03/30/2023   HCT 28.0 (L) 03/30/2023   MCV 97.2 03/30/2023   PLT 216 03/30/2023      Chemistry      Component Value Date/Time   NA 136 03/30/2023 1453   NA 135 11/28/2014 1401   K 3.7 03/30/2023 1453   K 4.7 11/28/2014 1401   CL 108 03/30/2023 1453   CL 99 (L) 11/28/2014 1401   CO2 22 03/30/2023 1453   CO2 28 11/28/2014 1401   BUN 20 03/30/2023 1453   BUN 16 11/28/2014 1401   CREATININE 1.26 (H) 03/30/2023 1453   CREATININE 0.96  11/28/2014 1401      Component Value Date/Time   CALCIUM 9.3 03/30/2023 1453   CALCIUM 9.9 11/28/2014 1401   ALKPHOS 60 03/30/2023 1453   ALKPHOS 78 11/28/2014 1401   AST 31 03/30/2023 1453   ALT 22 03/30/2023 1453   ALT 28 11/28/2014 1401   BILITOT 0.3 03/30/2023 1453       RADIOGRAPHIC STUDIES: I have personally reviewed the radiological images as listed and agreed with the findings in the report. No results found.   ASSESSMENT & PLAN:  Carcinoma of lower-outer quadrant of right breast in female, estrogen receptor positive (HCC)  # Recurrent breast cancer-ER positive PR negative ? HER-2/neu-currently on abema+ Anastrozole. JAN 2024-Discontinued faslodex sec to insurance issues.]  JULY 2024-  New small nodule of the superior segment right lower lobe measuring 0.7 cm, possibly infectious or inflammatory but suspicious for a pulmonary metastasis.  No evidence of lymphadenopathy or metastatic disease in the abdomen or pelvis.  # Continue Anastrazole [JAN Mid 2024-] for now. Re-Start Abema [100 BID; off weekends].Labs today-ANC- 1.2 Hb-11; platelets-N. Will repeat imaging in 36month; ordered today.   # incidental CT JULy 2024- Distal transverse, descending, and sigmoid diverticulosis, with fat stranding about the splenic flexure in the left upper quadrant. Correlate for signs and symptoms of acute diverticulitis- no symtoms; monitor for now.    # Sinus Tachycardia- recommend/reminded to start low dose coreg 6.125 BID. Script sent-  stable.  # Bilateral itchy watery eyes and  sneezing. No improvement from Montelukast-  stable.  # Hyponatremia [131]/CKD stage III- sec to poor hydration/diarrhea- ? abema- recommend increasing fluids/salt intake electrolytes.stable.  # Hypomagnesia: 1.4- on OTC mag-stable.  # DM- BG-13- s/p endocrinology evaluation; with diet and medications [trucilicity; Novolog]- stable.  # recurrent UTI/ ? Suprapubic tenderness- S/p eval with urology.  Monitor closely  on Abema/with neutropenia- s/p evaluation with Consuello Masse re: vaginal atrophy today- stable.  # intermittent Hypercalcemia: Ca 10.4- monitor for now. FEB Vit D - 90; HOLD off calcium and Vit D- stable.  # Low B 12 [PCP]- on B12 PO. stable.  #Incidental findings on Imaging  CT , 2024:    Hepatic steatosis; Cholelithiasis; Coronary artery disease; reviewed/discussed/ counseled the patient.   # IV access; PIV.   #DISPOSITION:  AM appt pref # Follow up in aug 26th/4 weeks- MD; labs [CBC/CMP/b12; mag; Ca 27-29;iron studies; ferritin]- Dr.B  # I reviewed the blood work- with the patient in detail; also reviewed the imaging independently [as summarized above]; and with the patient in detail.    No orders of the defined types were placed in this encounter.  All questions were answered. The patient knows to call the clinic with any problems, questions or concerns.      Earna Coder, MD 03/30/2023 3:32 PM

## 2023-03-30 NOTE — Progress Notes (Signed)
CT results

## 2023-03-31 ENCOUNTER — Ambulatory Visit: Payer: PPO | Admitting: Internal Medicine

## 2023-03-31 ENCOUNTER — Other Ambulatory Visit: Payer: PPO

## 2023-04-06 DIAGNOSIS — E1122 Type 2 diabetes mellitus with diabetic chronic kidney disease: Secondary | ICD-10-CM | POA: Diagnosis not present

## 2023-04-06 DIAGNOSIS — R399 Unspecified symptoms and signs involving the genitourinary system: Secondary | ICD-10-CM | POA: Diagnosis not present

## 2023-04-06 DIAGNOSIS — W57XXXA Bitten or stung by nonvenomous insect and other nonvenomous arthropods, initial encounter: Secondary | ICD-10-CM | POA: Diagnosis not present

## 2023-04-06 DIAGNOSIS — S1086XA Insect bite of other specified part of neck, initial encounter: Secondary | ICD-10-CM | POA: Diagnosis not present

## 2023-04-06 DIAGNOSIS — Z794 Long term (current) use of insulin: Secondary | ICD-10-CM | POA: Diagnosis not present

## 2023-04-06 DIAGNOSIS — N1831 Chronic kidney disease, stage 3a: Secondary | ICD-10-CM | POA: Diagnosis not present

## 2023-04-09 ENCOUNTER — Emergency Department
Admission: EM | Admit: 2023-04-09 | Discharge: 2023-04-10 | Disposition: A | Discharge status: Home / Self Care | Payer: PPO | Attending: Emergency Medicine | Admitting: Emergency Medicine

## 2023-04-09 ENCOUNTER — Other Ambulatory Visit: Payer: Self-pay

## 2023-04-09 DIAGNOSIS — Z7982 Long term (current) use of aspirin: Secondary | ICD-10-CM | POA: Insufficient documentation

## 2023-04-09 DIAGNOSIS — S30860A Insect bite (nonvenomous) of lower back and pelvis, initial encounter: Secondary | ICD-10-CM | POA: Insufficient documentation

## 2023-04-09 DIAGNOSIS — R509 Fever, unspecified: Secondary | ICD-10-CM | POA: Insufficient documentation

## 2023-04-09 DIAGNOSIS — R1111 Vomiting without nausea: Secondary | ICD-10-CM | POA: Diagnosis not present

## 2023-04-09 DIAGNOSIS — Z7984 Long term (current) use of oral hypoglycemic drugs: Secondary | ICD-10-CM | POA: Diagnosis not present

## 2023-04-09 DIAGNOSIS — R519 Headache, unspecified: Secondary | ICD-10-CM | POA: Insufficient documentation

## 2023-04-09 DIAGNOSIS — M791 Myalgia, unspecified site: Secondary | ICD-10-CM | POA: Insufficient documentation

## 2023-04-09 DIAGNOSIS — E1122 Type 2 diabetes mellitus with diabetic chronic kidney disease: Secondary | ICD-10-CM | POA: Insufficient documentation

## 2023-04-09 DIAGNOSIS — Z9012 Acquired absence of left breast and nipple: Secondary | ICD-10-CM | POA: Diagnosis not present

## 2023-04-09 DIAGNOSIS — Z853 Personal history of malignant neoplasm of breast: Secondary | ICD-10-CM | POA: Insufficient documentation

## 2023-04-09 DIAGNOSIS — N189 Chronic kidney disease, unspecified: Secondary | ICD-10-CM | POA: Diagnosis not present

## 2023-04-09 DIAGNOSIS — R112 Nausea with vomiting, unspecified: Secondary | ICD-10-CM | POA: Diagnosis not present

## 2023-04-09 DIAGNOSIS — I129 Hypertensive chronic kidney disease with stage 1 through stage 4 chronic kidney disease, or unspecified chronic kidney disease: Secondary | ICD-10-CM | POA: Insufficient documentation

## 2023-04-09 DIAGNOSIS — R739 Hyperglycemia, unspecified: Secondary | ICD-10-CM | POA: Diagnosis not present

## 2023-04-09 DIAGNOSIS — W57XXXA Bitten or stung by nonvenomous insect and other nonvenomous arthropods, initial encounter: Secondary | ICD-10-CM | POA: Diagnosis not present

## 2023-04-09 DIAGNOSIS — I10 Essential (primary) hypertension: Secondary | ICD-10-CM | POA: Diagnosis not present

## 2023-04-09 DIAGNOSIS — Z79899 Other long term (current) drug therapy: Secondary | ICD-10-CM | POA: Insufficient documentation

## 2023-04-09 DIAGNOSIS — R110 Nausea: Secondary | ICD-10-CM | POA: Diagnosis not present

## 2023-04-09 MED ORDER — ONDANSETRON HCL 4 MG/2ML IJ SOLN
4.0000 mg | Freq: Once | INTRAMUSCULAR | Status: AC
  Administered 2023-04-10: 4 mg via INTRAVENOUS
  Filled 2023-04-09: qty 2

## 2023-04-09 MED ORDER — SODIUM CHLORIDE 0.9 % IV BOLUS (SEPSIS)
1000.0000 mL | Freq: Once | INTRAVENOUS | Status: AC
  Administered 2023-04-10: 1000 mL via INTRAVENOUS

## 2023-04-09 MED ORDER — SODIUM CHLORIDE 0.9 % IV SOLN
100.0000 mg | Freq: Once | INTRAVENOUS | Status: AC
  Administered 2023-04-10: 100 mg via INTRAVENOUS
  Filled 2023-04-09: qty 100

## 2023-04-09 NOTE — ED Triage Notes (Signed)
Pt coming from home for nausea x 2 days but vomiting. Pt was dx with UTI on Wednesday (Amoxicillin), bit by tick Thursday 03/31/23 but sts her PCP believes the nausea is coming from her UTI and not the UTI. Pt also has Stage 3 breast Ca and on oral chemo.

## 2023-04-09 NOTE — ED Provider Notes (Signed)
[facility] Provider Note    Event Date/Time   First MD Initiated Contact with Patient 04/09/23 2303     (approximate)   History   Nausea (Pt coming from home for nausea x 2 days but vomiting. Pt was dx with UTI on Wednesday (Amoxicillin), bit by tick Thursday 03/31/23 but sts her PCP believes the nausea is coming from her UTI and not the UTI. Pt also has Stage 3 breast Ca and on oral chemo. )   HPI  Kimberly STELZNER is a 67 y.o. female with history of stage IV breast cancer on Verzenio, chronic kidney disease, hypertension, diabetes who presents to the emergency department with complaints of several days of fever, nausea and vomiting, body aches, intermittent headache.  States she was bit by a tick on 03/31/2023.  Has redness and stiffness around this area which is at the lower portion of her neck/left shoulder blade.  Seen by her primary care provider who diagnosed her with UTI.  Reportedly culture is pending.  On Augmentin for the past several days and states she is feeling worse.  No chest pain or shortness of breath.  No cough.  No abdominal pain.  No diarrhea.  Denies any dysuria.   History provided by patient and family.    Past Medical History:  Diagnosis Date   Arthritis    Cancer of lower-outer quadrant of female breast (HCC) 08/06/2011   RIGHT, chemo and bilateral mastectomies.   High cholesterol    History of kidney stones    Hx of bilateral breast implants    Hypertension    Lung metastases 20118   chemo pills and faslidex shots.   PONV (postoperative nausea and vomiting)    Type II diabetes mellitus (HCC)    fasting 140-150   Vertigo    LAST WEEK   Vertigo 01/2017    Past Surgical History:  Procedure Laterality Date   BREAST BIOPSY  07/2011, 2020   right   BREAST RECONSTRUCTION  03/07/2012   Procedure: BREAST RECONSTRUCTION;  Surgeon: Etter Sjogren, MD;  Location: [facility] OR;  Service: Plastics;  Laterality: Left;  BREAST RECONSTRUCTION WITH  PLACEMENT OF TISSUE EXPANDER TO LEFT BREAST   BREAST RECONSTRUCTION  02/06/2013   CESAREAN SECTION  [id number]   ENDOBRONCHIAL ULTRASOUND N/A 02/24/2017   Procedure: ENDOBRONCHIAL ULTRASOUND;  Surgeon: Erin Fulling, MD;  Location: [facility] ORS;  Service: Cardiopulmonary;  Laterality: N/A;   LATISSIMUS FLAP TO BREAST Right 02/06/2013   Procedure: LATISSIMUS FLAP TO RIGHT BREAST WITH IMPLANT;  Surgeon: Etter Sjogren, MD;  Location[facility] OR;  Service: Plastics;  Laterality: Right;   MASTECTOMY  03/07/12   modified right; total left   MODIFIED MASTECTOMY  03/07/2012   Procedure: MODIFIED MASTECTOMY;  Surgeon: Currie Paris, MD;  Location: [facility] OR;  Service: General;  Laterality: Right;   PORTACATH PLACEMENT  07/2011   SCAR REVISION  03/30/2012   Procedure: SCAR REVISION;  Surgeon: Currie Paris, MD;  Location: [facility];  Service: General;  Laterality: Right;  CLOSURE OF MASTECTOMY INCISION   WISDOM TOOTH EXTRACTION      MEDICATIONS:  Prior to Admission medications   Medication Sig Start Date End Date Taking? Authorizing Provider  abemaciclib (VERZENIO) 100 MG tablet TAKE 1 TABLET BY MOUTH TWICE DAILY. DO NOT CHEW, CRUSH, OR SPLIT TABLETS BEFORE SWALLOWING 02/24/23   Earna Coder, MD  albuterol (VENTOLIN HFA) 108 (90 Base) MCG/ACT inhaler Inhale 1-2 puffs into the lungs every 6 (  six) hours as needed for wheezing or shortness of breath. Patient not taking: Reported on 02/03/2023 01/12/23   Borders, Daryl Eastern, NP  anastrozole (ARIMIDEX) 1 MG tablet TAKE 1 TABLET BY MOUTH EVERY DAY 12/21/22   Earna Coder, MD  aspirin EC 81 MG tablet Take 81 mg by mouth daily. 09/15/22 09/15/23  [provider]  atorvastatin (LIPITOR) 20 MG tablet Take 1 tablet (20 mg total) by mouth daily. 04/11/19   Jamelle Haring, MD  Calcium Carb-Cholecalciferol 500-10 MG-MCG TABS Take by mouth. 09/15/22 09/15/23  [provider]  carvedilol (COREG) 6.25 MG tablet Take 1 tablet (6.25  mg total) by mouth 2 (two) times daily with a meal. Patient not taking: Reported on 02/24/2023 02/03/23   Earna Coder, MD  Cholecalciferol 50 MCG (2000 UT) CAPS Take 3 capsules daily for 3 months, then reduce to 1 capsule daily thereafter for Vitamin D Deficiency. 10/11/21   [provider]  clotrimazole-betamethasone (LOTRISONE) cream SMARTSIG:Topical Morning-Evening 01/19/21   [provider]  Cranberry-Vitamin C-Probiotic (AZO CRANBERRY PO) Take 1 Capful by mouth daily at 12 noon.    [provider]  cyanocobalamin (VITAMIN B12) 1000 MCG tablet 1,000 mcg daily. Take 2 tablets daily for 2 weeks, then reduce to 1 tablet daily thereafter for Vitamin B12 Deficiency. 09/15/22   [provider]  glipiZIDE (GLUCOTROL XL) 10 MG 24 hr tablet Take 1 tablet (10 mg total) by mouth 2 (two) times daily. 04/11/19   Jamelle Haring, MD  insulin aspart (NOVOLOG FLEXPEN) 100 UNIT/ML FlexPen Inject 4 Units into the skin 3 (three) times daily with meals. 10/24/19   Betancourt, Jarold Song, NP  Insulin Detemir (LEVEMIR) 100 UNIT/ML Pen Inject 20 Units into the skin at bedtime. 10/24/19   Betancourt, Jarold Song, NP  levothyroxine (SYNTHROID) 25 MCG tablet Take 1 tablet (25 mcg total) by mouth daily before breakfast. 03/24/20   Earna Coder, MD  lisinopril (ZESTRIL) 10 MG tablet Take 10 mg by mouth daily.    [provider]  loperamide (IMODIUM A-D) 2 MG tablet Take 2 mg by mouth 4 (four) times daily as needed for diarrhea or loose stools.    [provider]  magnesium oxide (MAG-OX) 400 MG tablet Take 1 tablet by mouth daily. 10/11/21   [provider]  Melatonin 10 MG TABS Take 1 tablet by mouth at bedtime as needed.    [provider]  ondansetron (ZOFRAN-ODT) 4 MG disintegrating tablet Take 1 tablet (4 mg total) by mouth every 8 (eight) hours as needed for nausea or vomiting. 11/17/22   Earna Coder, MD  potassium chloride  (KLOR-CON M) 10 MEQ tablet Take 1 tablet (10 mEq total) by mouth daily. Patient not taking: Reported on 02/03/2023 01/12/23   Borders, Daryl Eastern, NP  TRULICITY 0.75 MG/0.5ML SOPN SMARTSIG:0.5 Milliliter(s) SUB-Q Once a Week 02/25/22   [provider]    Physical Exam   Triage Vital Signs: ED Triage Vitals  Encounter Vitals Group     BP 04/09/23 2307 (!) 150/74     Systolic BP Percentile --      Diastolic BP Percentile --      Pulse Rate 04/09/23 2307 76     Resp 04/09/23 2307 20     Temp 04/09/23 2307 98.4 F (36.9 C)     Temp Source 04/09/23 2307 Oral     SpO2 04/09/23 2307 100 %     Weight 04/09/23 2308 161 lb 13.1 oz (73.4  kg)     Height 04/09/23 2308 5' 2.5" (1.588 m)     Head Circumference --      Peak Flow --      Pain Score --      Pain Loc --      Pain Education --      Exclude from Growth Chart --     Most recent vital signs: Vitals:   04/10/23 0300 04/10/23 0312  BP: 121/62   Pulse: 73   Resp: 13   Temp:  98.3 F (36.8 C)  SpO2: 99%     CONSTITUTIONAL: Alert, responds appropriately to questions. Well-appearing; well-nourished HEAD: Normocephalic, atraumatic EYES: Conjunctivae clear, pupils appear equal, sclera nonicteric ENT: normal nose; moist mucous membranes NECK: Supple, normal ROM CARD: RRR; S1 and S2 appreciated RESP: Normal chest excursion without splinting or tachypnea; breath sounds clear and equal bilaterally; no wheezes, no rhonchi, no rales, no hypoxia or respiratory distress, speaking full sentences ABD/GI: Non-distended; soft, non-tender, no rebound, no guarding, no peritoneal signs BACK: The back appears normal EXT: Normal ROM in all joints; no deformity noted, no edema SKIN: Normal color for age and race; warm; no rash on exposed skin NEURO: Moves all extremities equally, normal speech PSYCH: The patient's mood and manner are appropriate.   ED Results / Procedures / Treatments   LABS: (all labs ordered are listed, but only  abnormal results are displayed) Labs Reviewed  CBC WITH DIFFERENTIAL/PLATELET - Abnormal; Notable for the following components:      Result Value   WBC 2.2 (*)    RBC 3.00 (*)    Hemoglobin 9.8 (*)    HCT 28.5 (*)    RDW 15.7 (*)    Neutro Abs 1.1 (*)    All other components within normal limits  COMPREHENSIVE METABOLIC PANEL - Abnormal; Notable for the following components:   Sodium 128 (*)    CO2 20 (*)    Glucose, Bld 223 (*)    Creatinine, Ser 1.08 (*)    Calcium 8.1 (*)    Total Protein 6.3 (*)    Albumin 2.9 (*)    GFR, Estimated 56 (*)    All other components within normal limits  URINALYSIS, ROUTINE W REFLEX MICROSCOPIC - Abnormal; Notable for the following components:   Color, Urine STRAW (*)    APPearance CLEAR (*)    Glucose, UA >=500 (*)    Ketones, ur 5 (*)    Leukocytes,Ua SMALL (*)    All other components within normal limits  URINE CULTURE  LIPASE, BLOOD  LACTIC ACID, PLASMA     EKG:  EKG Interpretation Date/Time:  Saturday April 09 2023 23:07:38 EDT Ventricular Rate:  75 PR Interval:  141 QRS Duration:  90 QT Interval:  414 QTC Calculation: 463 R Axis:   50  Text Interpretation: Sinus rhythm Low voltage, extremity and precordial leads Confirmed by Rochele Raring [phone]) on 04/10/2023 1:18:16 AM         RADIOLOGY: My personal review and interpretation of imaging:    I have personally reviewed all radiology reports.   No results found.   PROCEDURES:  Critical Care performed: No     Procedures    IMPRESSION / MDM / ASSESSMENT AND PLAN / ED COURSE  I reviewed the triage vital signs and the nursing notes.    Patient here with recent tick bite and recent diagnosis of UTI with continued fevers, nausea and vomiting.  The patient is on the cardiac monitor to  evaluate for evidence of arrhythmia and/or significant heart rate changes.   DIFFERENTIAL DIAGNOSIS (includes but not limited to):   Viral illness, gastroenteritis, tickborne  illness, UTI, sepsis   Patient's presentation is most consistent with acute presentation with potential threat to life or bodily function.   PLAN: Will obtain labs, repeat urine and urine culture to see if Augmentin is helping with her UTI.  Discussed obtaining tickborne labs but we did discuss that it would not change her management today as I feel she should be started on doxycycline given recent tick bite with early signs of surrounding cellulitis and becoming symptomatic.  Patient agrees to hold off on tickborne labs but we will start her on doxycycline.  Will give first dose IV given vomiting.  Will give IV fluids and Zofran.   MEDICATIONS GIVEN IN ED: Medications  sodium chloride 0.9 % bolus 1,000 mL (0 mLs Intravenous Stopped 04/10/23 0130)  ondansetron (ZOFRAN) injection 4 mg (4 mg Intravenous Given 04/10/23 0020)  doxycycline (VIBRAMYCIN) 100 mg in sodium chloride 0.9 % 250 mL IVPB (0 mg Intravenous Stopped 04/10/23 0220)  sodium chloride 0.9 % bolus 1,000 mL (0 mLs Intravenous Stopped 04/10/23 0220)     ED COURSE: Patient's labs show chronic neutropenia, anemia which is stable.  Sodium slightly low at 128. Mild elevation of her creatinine which is stable.  Normal LFTs.  Normal lipase.  Normal lactic.  Urine does show some white blood cells but no bacteria and this appears to have improved compared to previous urine seen in her chart.  Culture is pending.  Given her urine is improving recommended that we keep her on Augmentin at this time but given she is feeling worse I am worried that her symptoms could be due to tickborne illness.  Will start her on doxycycline twice a day for the next week.  She states she is feeling better here and tolerating p.o. and comfortable plan for discharge home.  She has Zofran for home.  Will also provide her with Phenergan which she states she has taken before and done well with.  Recommended Tylenol for fever, rest.  Discussed return precautions with patient  and family.   At this time, I do not feel there is any life-threatening condition present. I reviewed all nursing notes, vitals, pertinent previous records.  All lab and urine results, EKGs, imaging ordered have been independently reviewed and interpreted by myself.  I reviewed all available radiology reports from any imaging ordered this visit.  Based on my assessment, I feel the patient is safe to be discharged home without further emergent workup and can continue workup as an outpatient as needed. Discussed all findings, treatment plan as well as usual and customary return precautions.  They verbalize understanding and are comfortable with this plan.  Outpatient follow-up has been provided as needed.  All questions have been answered.    CONSULTS: Admission considered but patient feeling better and requesting discharge home.   OUTSIDE RECORDS REVIEWED: Reviewed recent PCP notes.       FINAL CLINICAL IMPRESSION(S) / ED DIAGNOSES   Final diagnoses:  Nausea and vomiting in adult  Tick bite of back, initial encounter     Rx / DC Orders   ED Discharge Orders          Ordered    doxycycline (VIBRAMYCIN) 100 MG capsule  2 times daily        04/10/23 0253    promethazine (PHENERGAN) 25 MG tablet  Every 8 hours  PRN        04/10/23 0253             Note:  This document was prepared using Dragon voice recognition software and may include unintentional dictation errors.   Devone Tousley, Layla Maw, DO 04/10/23 [phone]

## 2023-04-10 LAB — CBC WITH DIFFERENTIAL/PLATELET
Abs Immature Granulocytes: 0.01 10*3/uL (ref 0.00–0.07)
Basophils Absolute: 0 10*3/uL (ref 0.0–0.1)
Basophils Relative: 1 %
Eosinophils Absolute: 0 10*3/uL (ref 0.0–0.5)
Eosinophils Relative: 0 %
HCT: 28.5 % — ABNORMAL LOW (ref 36.0–46.0)
Hemoglobin: 9.8 g/dL — ABNORMAL LOW (ref 12.0–15.0)
Immature Granulocytes: 1 %
Lymphocytes Relative: 39 %
Lymphs Abs: 0.8 10*3/uL (ref 0.7–4.0)
MCH: 32.7 pg (ref 26.0–34.0)
MCHC: 34.4 g/dL (ref 30.0–36.0)
MCV: 95 fL (ref 80.0–100.0)
Monocytes Absolute: 0.2 10*3/uL (ref 0.1–1.0)
Monocytes Relative: 7 %
Neutro Abs: 1.1 10*3/uL — ABNORMAL LOW (ref 1.7–7.7)
Neutrophils Relative %: 52 %
Platelets: 164 10*3/uL (ref 150–400)
RBC: 3 MIL/uL — ABNORMAL LOW (ref 3.87–5.11)
RDW: 15.7 % — ABNORMAL HIGH (ref 11.5–15.5)
Smear Review: NORMAL
WBC: 2.2 10*3/uL — ABNORMAL LOW (ref 4.0–10.5)
nRBC: 0 % (ref 0.0–0.2)

## 2023-04-10 LAB — URINALYSIS, ROUTINE W REFLEX MICROSCOPIC
Bacteria, UA: NONE SEEN
Bilirubin Urine: NEGATIVE
Glucose, UA: >=500 mg/dL — AB
Hgb urine dipstick: NEGATIVE
Ketones, ur: 5 mg/dL — AB
Nitrite: NEGATIVE
Protein, ur: NEGATIVE mg/dL
Specific Gravity, Urine: 1.006 (ref 1.005–1.030)
pH: 5 (ref 5.0–8.0)

## 2023-04-10 LAB — COMPREHENSIVE METABOLIC PANEL WITH GFR
ALT: 25 U/L (ref 0–44)
AST: 35 U/L (ref 15–41)
Albumin: 2.9 g/dL — ABNORMAL LOW (ref 3.5–5.0)
Alkaline Phosphatase: 54 U/L (ref 38–126)
Anion gap: 8 (ref 5–15)
BUN: 16 mg/dL (ref 8–23)
CO2: 20 mmol/L — ABNORMAL LOW (ref 22–32)
Calcium: 8.1 mg/dL — ABNORMAL LOW (ref 8.9–10.3)
Chloride: 100 mmol/L (ref 98–111)
Creatinine, Ser: 1.08 mg/dL — ABNORMAL HIGH (ref 0.44–1.00)
GFR, Estimated: 56 mL/min — ABNORMAL LOW (ref >60)
Glucose, Bld: 223 mg/dL — ABNORMAL HIGH (ref 70–99)
Potassium: 3.5 mmol/L (ref 3.5–5.1)
Sodium: 128 mmol/L — ABNORMAL LOW (ref 135–145)
Total Bilirubin: 1.1 mg/dL (ref 0.3–1.2)
Total Protein: 6.3 g/dL — ABNORMAL LOW (ref 6.5–8.1)

## 2023-04-10 LAB — LACTIC ACID, PLASMA: Lactic Acid, Venous: 1.7 mmol/L (ref 0.5–1.9)

## 2023-04-10 LAB — LIPASE, BLOOD: Lipase: 25 U/L (ref 11–51)

## 2023-04-10 MED ORDER — DOXYCYCLINE HYCLATE 100 MG PO CAPS: 100.0000 mg | ORAL_CAPSULE | Freq: Two times a day (BID) | ORAL | 0 refills | Status: DC

## 2023-04-10 MED ORDER — PROMETHAZINE HCL 25 MG PO TABS: 25.0000 mg | ORAL_TABLET | Freq: Three times a day (TID) | ORAL | 0 refills | Status: DC | PRN

## 2023-04-10 MED ORDER — SODIUM CHLORIDE 0.9 % IV BOLUS (SEPSIS)
1000.0000 mL | Freq: Once | INTRAVENOUS | Status: AC
  Administered 2023-04-10: 1000 mL via INTRAVENOUS

## 2023-04-10 NOTE — ED Notes (Signed)
Water given to patient Water tolerated well by patient

## 2023-04-10 NOTE — Discharge Instructions (Addendum)
You may take over-the-counter Tylenol 1000 mg every 6 hours as needed for fever, pain.  Please continue your Augmentin as prescribed as it appears your urinary tract infection is improving but we are adding on doxycycline to cover for tickborne illness given you feel your symptoms are worsening.  You may alternate Zofran and phenergan as needed for nausea and vomiting.

## 2023-04-11 LAB — URINE CULTURE

## 2023-04-15 ENCOUNTER — Telehealth: Payer: Self-pay

## 2023-04-15 NOTE — Telephone Encounter (Signed)
Transition Care Management Follow-up Telephone Call Date of discharge and from where: 04/10/2023 [facility] How have you been since you were released from the hospital? Patient is feeling better but is experiencing dizziness. Any questions or concerns? No  Items Reviewed: Did the pt receive and understand the discharge instructions provided? Yes  Medications obtained and verified? Yes  Other? No  Any new allergies since your discharge? No  Dietary orders reviewed? Yes Do you have support at home? Yes   Follow up appointments reviewed:  PCP Hospital f/u appt confirmed?  Patient stated she is waiting for the dizziness to subside before driving if it is not better in the next day or two she will have someone drive her.  Scheduled to see  on  @ . [facility] f[facility]u appt confirmed? Yes  Scheduled to see Lenon Oms, NP on 04/25/2023 @ [facility]. Are transportation arrangements needed?  Patient stated she has family to drive her if needed. Offered transportation assistance. If their condition worsens, is the pt aware to call PCP or go to the Emergency Dept.? Yes Was the patient provided with contact information for the PCP's office or ED? Yes Was to pt encouraged to call back with questions or concerns? Yes  Alyx Mcguirk [facility]  [facility] Community Resource Care Guide   [email or web address]  ?? [phone]   Website: [email or web address]  [email or web address]

## 2023-04-29 ENCOUNTER — Inpatient Hospital Stay: Payer: PPO

## 2023-04-29 ENCOUNTER — Inpatient Hospital Stay: Payer: PPO | Attending: Internal Medicine

## 2023-04-29 ENCOUNTER — Inpatient Hospital Stay: Payer: PPO | Admitting: Internal Medicine

## 2023-04-29 ENCOUNTER — Encounter: Payer: Self-pay | Admitting: Internal Medicine

## 2023-04-29 DIAGNOSIS — C7800 Secondary malignant neoplasm of unspecified lung: Secondary | ICD-10-CM | POA: Insufficient documentation

## 2023-04-29 DIAGNOSIS — E860 Dehydration: Secondary | ICD-10-CM | POA: Insufficient documentation

## 2023-04-29 DIAGNOSIS — Z86718 Personal history of other venous thrombosis and embolism: Secondary | ICD-10-CM | POA: Insufficient documentation

## 2023-04-29 DIAGNOSIS — E119 Type 2 diabetes mellitus without complications: Secondary | ICD-10-CM | POA: Diagnosis not present

## 2023-04-29 DIAGNOSIS — E871 Hypo-osmolality and hyponatremia: Secondary | ICD-10-CM | POA: Insufficient documentation

## 2023-04-29 DIAGNOSIS — Z7985 Long-term (current) use of injectable non-insulin antidiabetic drugs: Secondary | ICD-10-CM | POA: Insufficient documentation

## 2023-04-29 DIAGNOSIS — Z79899 Other long term (current) drug therapy: Secondary | ICD-10-CM | POA: Insufficient documentation

## 2023-04-29 DIAGNOSIS — R000 Tachycardia, unspecified: Secondary | ICD-10-CM | POA: Diagnosis not present

## 2023-04-29 DIAGNOSIS — C50511 Malignant neoplasm of lower-outer quadrant of right female breast: Secondary | ICD-10-CM | POA: Diagnosis not present

## 2023-04-29 DIAGNOSIS — I959 Hypotension, unspecified: Secondary | ICD-10-CM | POA: Insufficient documentation

## 2023-04-29 DIAGNOSIS — Z7984 Long term (current) use of oral hypoglycemic drugs: Secondary | ICD-10-CM | POA: Insufficient documentation

## 2023-04-29 DIAGNOSIS — Z79811 Long term (current) use of aromatase inhibitors: Secondary | ICD-10-CM | POA: Diagnosis not present

## 2023-04-29 DIAGNOSIS — Z170 Estrogen receptor positive status [ER+]: Secondary | ICD-10-CM | POA: Diagnosis not present

## 2023-04-29 DIAGNOSIS — Z7982 Long term (current) use of aspirin: Secondary | ICD-10-CM | POA: Insufficient documentation

## 2023-04-29 DIAGNOSIS — Z9013 Acquired absence of bilateral breasts and nipples: Secondary | ICD-10-CM | POA: Diagnosis not present

## 2023-04-29 LAB — CMP (CANCER CENTER ONLY)
ALT: 27 U/L (ref 0–44)
AST: 36 U/L (ref 15–41)
Albumin: 3.4 g/dL — ABNORMAL LOW (ref 3.5–5.0)
Alkaline Phosphatase: 79 U/L (ref 38–126)
Anion gap: 7 (ref 5–15)
BUN: 14 mg/dL (ref 8–23)
CO2: 23 mmol/L (ref 22–32)
Calcium: 10.1 mg/dL (ref 8.9–10.3)
Chloride: 102 mmol/L (ref 98–111)
Creatinine: 1.12 mg/dL — ABNORMAL HIGH (ref 0.44–1.00)
GFR, Estimated: 54 mL/min — ABNORMAL LOW (ref >60)
Glucose, Bld: 87 mg/dL (ref 70–99)
Potassium: 3.6 mmol/L (ref 3.5–5.1)
Sodium: 132 mmol/L — ABNORMAL LOW (ref 135–145)
Total Bilirubin: 0.6 mg/dL (ref 0.3–1.2)
Total Protein: 6.5 g/dL (ref 6.5–8.1)

## 2023-04-29 LAB — CBC WITH DIFFERENTIAL (CANCER CENTER ONLY)
Abs Immature Granulocytes: 0.02 10*3/uL (ref 0.00–0.07)
Basophils Absolute: 0.1 10*3/uL (ref 0.0–0.1)
Basophils Relative: 3 %
Eosinophils Absolute: 0.1 10*3/uL (ref 0.0–0.5)
Eosinophils Relative: 2 %
HCT: 28.4 % — ABNORMAL LOW (ref 36.0–46.0)
Hemoglobin: 9.4 g/dL — ABNORMAL LOW (ref 12.0–15.0)
Immature Granulocytes: 1 %
Lymphocytes Relative: 45 %
Lymphs Abs: 1.1 10*3/uL (ref 0.7–4.0)
MCH: 32.9 pg (ref 26.0–34.0)
MCHC: 33.1 g/dL (ref 30.0–36.0)
MCV: 99.3 fL (ref 80.0–100.0)
Monocytes Absolute: 0.2 10*3/uL (ref 0.1–1.0)
Monocytes Relative: 9 %
Neutro Abs: 1 10*3/uL — ABNORMAL LOW (ref 1.7–7.7)
Neutrophils Relative %: 40 %
Platelet Count: 196 10*3/uL (ref 150–400)
RBC: 2.86 MIL/uL — ABNORMAL LOW (ref 3.87–5.11)
RDW: 16.4 % — ABNORMAL HIGH (ref 11.5–15.5)
WBC Count: 2.5 10*3/uL — ABNORMAL LOW (ref 4.0–10.5)
nRBC: 0 % (ref 0.0–0.2)

## 2023-04-29 LAB — FERRITIN: Ferritin: 100 ng/mL (ref 11–307)

## 2023-04-29 LAB — IRON AND TIBC
Iron: 92 ug/dL (ref 28–170)
Saturation Ratios: 27 % (ref 10.4–31.8)
TIBC: 336 ug/dL (ref 250–450)
UIBC: 244 ug/dL

## 2023-04-29 LAB — VITAMIN B12: Vitamin B-12: 1131 pg/mL — ABNORMAL HIGH (ref 180–914)

## 2023-04-29 LAB — MAGNESIUM: Magnesium: 1.4 mg/dL — ABNORMAL LOW (ref 1.7–2.4)

## 2023-04-29 MED ORDER — SODIUM CHLORIDE 0.9 % IV SOLN
Freq: Once | INTRAVENOUS | Status: AC
  Filled 2023-04-29: qty 250

## 2023-04-29 NOTE — Patient Instructions (Signed)
#   Stop taking lisinopril until next visit  # Stop taking Verzenio until next visit

## 2023-04-29 NOTE — Progress Notes (Signed)
[name] OFFICE PROGRESS NOTE  Patient Care Team: Luciana Axe, NP as PCP - General (Family Medicine) Cyndia Bent, MD (Inactive) (General Surgery) Harold Hedge, MD (Obstetrics and Gynecology) Carmina Miller, MD Johney Maine, MD (Unknown Physician Specialty) Earna Coder, MD as Consulting Physician (Hematology and Oncology)   Cancer Staging  Carcinoma of lower-outer quadrant of right breast in female, estrogen receptor positive [facility]) Staging form: Breast, AJCC 7th Edition - Pathologic: ypT1c,ypN2a, MX - Signed by Currie Paris, MD on 03/10/2012 Cancer stage: ypT1c,ypN2a, MX    Oncology History Overview Note  # DEC 2012- RIGHT BREAST CA T2 N2 M0 tumor from biopsy.  Estrogen receptor positive, Progesterone receptor positive.  Current receptor negative by FISH 2. Neoadjuvant chemotherapy started in December of 28 with Cytoxan Adriamycin 3. Started on Taxol weekly chemotherapy. 4. Patient finished 12  cycles of Taxol chemotherapy in May of 2013.     5. Status post right modified radical mastectomy [Dr.Bowers; [place]] June of 2013, ypT1c  yp N2  MO. started also on Lerazole    7. Radiation therapy to the right breast (September of 2013).  Lymph node was positive for HER-2 receptor gene amplification of 2.22.  Will proceed with Herceptin treatment starting in September of 2013.   8.Patient has finished Herceptin (maintenance therapy) in August of 2014 8. Start patient on letrozole from November, 2013. 9. Patient started on Herceptin in September 2013.    10Patient finished 12 months of Herceptin therapy on August, 2014  # 6. Status post left side prophylactic mastectomy.  #Late MAY 2018-RECURRENCE BREAST CA- ER positive/PR negative; ?? HER-2/neu- [biopsy- proven-mediastinal lymph node; in suff for her 2 testing].  [elevated Tumor marker- CT/PET- uptake in Right Mediastinal LN; Sternum [June 2018 EBUS- Dr.Kasa]   # March 10 2017- faslodex +  Abema; OCT 5th CT-PR of mediastinal LN [consent]; Discontinued faslodex sec to insurance issues [last DEC 2023]. JAN 2024- continue Abema + Anastrzole   #August 2020-left chest wall nodule biopsy benign  #August 2019- DVT-right upper extremity; Eliquis; stop end of March 2021 [repeat ultrasound negative/patient preference]; # JAN 2022-Cirrhosis- ? On CT scan-FEB liver- Negative fro overt cirrhosis --------------------------------------------------------------- -    DIAGNOSIS: [ BREAST CANCER- ER/PR/HER2 NEU POS  STAGE:  4   ;GOALS: PALLIATIVE     Carcinoma of lower-outer quadrant of right breast in female, estrogen receptor positive (HCC)   INTERVAL HISTORY: Alone.  Ambulating independently.  Kimberly [name] 67 y.o.  female pleasant patient above history of metastatic ER PR positive HER-2/neu positive breast cancer currently on abema+ Anastrazole  is here for follow-up.  Patient noted to have tick bite 3 weeks ago, Made her very sick. She went to ED. Started on doxycycline which made her very dizzy. Had fever,nausea, weakness and fatigue. She has lost 7 lbs.   She is now feeling much better. Started back on all of her meds and eating normally 4 days ago. This morning she still has weakness and lightheadedness. BP is low 88/64. Denies any pain.   Patient appears pale.   Review of Systems  Constitutional:  Positive for malaise/fatigue. Negative for chills, diaphoresis, fever and weight loss.  HENT:  Negative for nosebleeds and sore throat.   Eyes:  Negative for double vision.  Respiratory:  Negative for cough, hemoptysis, sputum production, shortness of breath and wheezing.   Cardiovascular:  Negative for chest pain, palpitations, orthopnea and leg swelling.  Gastrointestinal:  Negative for abdominal pain, blood in stool, constipation,  heartburn, melena, nausea and vomiting.  Musculoskeletal:  Positive for back pain and joint pain.  Neurological:  Negative for dizziness,  tingling, focal weakness, weakness and headaches.  Endo/Heme/Allergies:  Does not bruise/bleed easily.  Psychiatric/Behavioral:  Negative for depression. The patient is not nervous/anxious and does not have insomnia.       PAST MEDICAL HISTORY :  Past Medical History:  Diagnosis Date   Arthritis    Cancer of lower-outer quadrant of female breast (HCC) 08/06/2011   RIGHT, chemo and bilateral mastectomies.   High cholesterol    History of kidney stones    Hx of bilateral breast implants    Hypertension    Lung metastases 20118   chemo pills and faslidex shots.   PONV (postoperative nausea and vomiting)    Type II diabetes mellitus (HCC)    fasting 140-150   Vertigo    LAST WEEK   Vertigo 01/2017    PAST SURGICAL HISTORY :   Past Surgical History:  Procedure Laterality Date   BREAST BIOPSY  07/2011, 2020   right   BREAST RECONSTRUCTION  03/07/2012   Procedure: BREAST RECONSTRUCTION;  Surgeon: Etter Sjogren, MD;  Location: [facility] OR;  Service: Plastics;  Laterality: Left;  BREAST RECONSTRUCTION WITH PLACEMENT OF TISSUE EXPANDER TO LEFT BREAST   BREAST RECONSTRUCTION  02/06/2013   CESAREAN SECTION  [id number]   ENDOBRONCHIAL ULTRASOUND N/A 02/24/2017   Procedure: ENDOBRONCHIAL ULTRASOUND;  Surgeon: Erin Fulling, MD;  Location: [facility] ORS;  Service: Cardiopulmonary;  Laterality: N/A;   LATISSIMUS FLAP TO BREAST Right 02/06/2013   Procedure: LATISSIMUS FLAP TO RIGHT BREAST WITH IMPLANT;  Surgeon: Etter Sjogren, MD;  Location: [facility] OR;  Service: Plastics;  Laterality: Right;   MASTECTOMY  03/07/12   modified right; total left   MODIFIED MASTECTOMY  03/07/2012   Procedure: MODIFIED MASTECTOMY;  Surgeon: Currie Paris, MD;  Location: [facility] OR;  Service: General;  Laterality: Right;   PORTACATH PLACEMENT  07/2011   SCAR REVISION  03/30/2012   Procedure: SCAR REVISION;  Surgeon: Currie Paris, MD;  Location: [facility];  Service: General;  Laterality: Right;  CLOSURE OF MASTECTOMY  INCISION   WISDOM TOOTH EXTRACTION      FAMILY HISTORY :   Family History  Problem Relation Age of Onset   Breast cancer Mother    Diabetes Father     SOCIAL HISTORY:   Social History   Tobacco Use   Smoking status: Never   Smokeless tobacco: Never  Vaping Use   Vaping status: Never Used  Substance Use Topics   Alcohol use: No   Drug use: No    ALLERGIES:  is allergic to sulfa antibiotics.  MEDICATIONS:  Current Outpatient Medications  Medication Sig Dispense Refill   abemaciclib (VERZENIO) 100 MG tablet TAKE 1 TABLET BY MOUTH TWICE DAILY. DO NOT CHEW, CRUSH, OR SPLIT TABLETS BEFORE SWALLOWING 56 tablet 5   albuterol (VENTOLIN HFA) 108 (90 Base) MCG/ACT inhaler Inhale 1-2 puffs into the lungs every 6 (six) hours as needed for wheezing or shortness of breath. 8 g 2   anastrozole (ARIMIDEX) 1 MG tablet TAKE 1 TABLET BY MOUTH EVERY DAY 90 tablet 2   aspirin EC 81 MG tablet Take 81 mg by mouth daily.     atorvastatin (LIPITOR) 20 MG tablet Take 1 tablet (20 mg total) by mouth daily. 90 tablet 3   Calcium Carb-Cholecalciferol 500-10 MG-MCG TABS Take by mouth.     carvedilol (COREG) 6.25 MG tablet Take  1 tablet (6.25 mg total) by mouth 2 (two) times daily with a meal. 60 tablet 3   Cholecalciferol 50 MCG (2000 UT) CAPS Take 3 capsules daily for 3 months, then reduce to 1 capsule daily thereafter for Vitamin D Deficiency.     clotrimazole-betamethasone (LOTRISONE) cream SMARTSIG:Topical Morning-Evening     Cranberry-Vitamin C-Probiotic (AZO CRANBERRY PO) Take 1 Capful by mouth daily at 12 noon.     cyanocobalamin (VITAMIN B12) 1000 MCG tablet 1,000 mcg daily. Take 2 tablets daily for 2 weeks, then reduce to 1 tablet daily thereafter for Vitamin B12 Deficiency.     glipiZIDE (GLUCOTROL XL) 10 MG 24 hr tablet Take 1 tablet (10 mg total) by mouth 2 (two) times daily. 180 tablet 3   insulin aspart (NOVOLOG FLEXPEN) 100 UNIT/ML FlexPen Inject 4 Units into the skin 3 (three) times daily  with meals. 15 mL 0   Insulin Detemir (LEVEMIR) 100 UNIT/ML Pen Inject 20 Units into the skin at bedtime. 15 mL 0   levothyroxine (SYNTHROID) 25 MCG tablet Take 1 tablet (25 mcg total) by mouth daily before breakfast. 90 tablet 1   lisinopril (ZESTRIL) 10 MG tablet Take 10 mg by mouth daily.     loperamide (IMODIUM A-D) 2 MG tablet Take 2 mg by mouth 4 (four) times daily as needed for diarrhea or loose stools.     magnesium oxide (MAG-OX) 400 MG tablet Take 1 tablet by mouth daily.     Melatonin 10 MG TABS Take 1 tablet by mouth at bedtime as needed.     ondansetron (ZOFRAN-ODT) 4 MG disintegrating tablet Take 1 tablet (4 mg total) by mouth every 8 (eight) hours as needed for nausea or vomiting. 30 tablet 3   potassium chloride (KLOR-CON M) 10 MEQ tablet Take 1 tablet (10 mEq total) by mouth daily. 7 tablet 0   promethazine (PHENERGAN) 25 MG tablet Take 1 tablet (25 mg total) by mouth every 8 (eight) hours as needed for nausea or vomiting. 15 tablet 0   TRULICITY 0.75 MG/0.5ML SOPN SMARTSIG:0.5 Milliliter(s) SUB-Q Once a Week     No current facility-administered medications for this visit.    PHYSICAL EXAMINATION: ECOG PERFORMANCE STATUS: 1 - Symptomatic but completely ambulatory  BP (!) 88/64 (BP Location: Right Arm, Patient Position: Sitting)   Pulse 77   Temp (!) 96.8 F (36 C) (Tympanic)   Resp 16   Wt 150 lb (68 kg)   SpO2 100%   BMI 27.00 kg/m   Filed Weights   04/29/23 0933  Weight: 150 lb (68 kg)        Physical Exam Constitutional:      Comments: She is alone.   HENT:     Head: Normocephalic and atraumatic.     Mouth/Throat:     Pharynx: No oropharyngeal exudate.  Eyes:     Pupils: Pupils are equal, round, and reactive to light.  Cardiovascular:     Rate and Rhythm: Normal rate and regular rhythm.  Pulmonary:     Effort: Pulmonary effort is normal. No respiratory distress.     Breath sounds: Normal breath sounds. No wheezing.  Abdominal:     General: Bowel  sounds are normal. There is no distension.     Palpations: Abdomen is soft. There is no mass.     Tenderness: There is no abdominal tenderness. There is no guarding or rebound.  Musculoskeletal:        General: No tenderness. Normal range of motion.  Cervical back: Normal range of motion and neck supple.     Comments: Approximately 1 cm hard nodule felt in the anterior chest wall/left parasternal.  Skin:    General: Skin is warm.  Neurological:     Mental Status: She is alert and oriented to person, place, and time.  Psychiatric:        Mood and Affect: Affect normal.    LABORATORY DATA:  I have reviewed the data as listed    Component Value Date/Time   NA 132 (L) 04/29/2023 0907   NA 135 11/28/2014 1401   K 3.6 04/29/2023 0907   K 4.7 11/28/2014 1401   CL 102 04/29/2023 0907   CL 99 (L) 11/28/2014 1401   CO2 23 04/29/2023 0907   CO2 28 11/28/2014 1401   GLUCOSE 87 04/29/2023 0907   GLUCOSE 215 (H) 11/28/2014 1401   BUN 14 04/29/2023 0907   BUN 16 11/28/2014 1401   CREATININE 1.12 (H) 04/29/2023 0907   CREATININE 0.96 11/28/2014 1401   CALCIUM 10.1 04/29/2023 0907   CALCIUM 9.9 11/28/2014 1401   PROT 6.5 04/29/2023 0907   PROT 7.6 11/28/2014 1401   ALBUMIN 3.4 (L) 04/29/2023 0907   ALBUMIN 4.5 11/28/2014 1401   AST 36 04/29/2023 0907   ALT 27 04/29/2023 0907   ALT 28 11/28/2014 1401   ALKPHOS 79 04/29/2023 0907   ALKPHOS 78 11/28/2014 1401   BILITOT 0.6 04/29/2023 0907   GFRNONAA 54 (L) 04/29/2023 0907   GFRNONAA >60 11/28/2014 1401   GFRAA >60 05/30/2020 1022   GFRAA >60 11/28/2014 1401    No results found for: "SPEP", "UPEP"  Lab Results  Component Value Date   WBC 2.5 (L) 04/29/2023   NEUTROABS 1.0 (L) 04/29/2023   HGB 9.4 (L) 04/29/2023   HCT 28.4 (L) 04/29/2023   MCV 99.3 04/29/2023   PLT 196 04/29/2023      Chemistry      Component Value Date/Time   NA 132 (L) 04/29/2023 0907   NA 135 11/28/2014 1401   K 3.6 04/29/2023 0907   K 4.7  11/28/2014 1401   CL 102 04/29/2023 0907   CL 99 (L) 11/28/2014 1401   CO2 23 04/29/2023 0907   CO2 28 11/28/2014 1401   BUN 14 04/29/2023 0907   BUN 16 11/28/2014 1401   CREATININE 1.12 (H) 04/29/2023 0907   CREATININE 0.96 11/28/2014 1401      Component Value Date/Time   CALCIUM 10.1 04/29/2023 0907   CALCIUM 9.9 11/28/2014 1401   ALKPHOS 79 04/29/2023 0907   ALKPHOS 78 11/28/2014 1401   AST 36 04/29/2023 0907   ALT 27 04/29/2023 0907   ALT 28 11/28/2014 1401   BILITOT 0.6 04/29/2023 0907       RADIOGRAPHIC STUDIES: I have personally reviewed the radiological images as listed and agreed with the findings in the report. No results found.   ASSESSMENT & PLAN:  Carcinoma of lower-outer quadrant of right breast in female, estrogen receptor positive (HCC)  # Recurrent breast cancer-ER positive PR negative ? HER-2/neu-currently on abema+ Anastrozole. JAN 2024-Discontinued faslodex sec to insurance issues.]  JULY 2024-  New small nodule of the superior segment right lower lobe measuring 0.7 cm, possibly infectious or inflammatory but suspicious for a pulmonary metastasis.  No evidence of lymphadenopathy or metastatic disease in the abdomen or pelvis.  # Continue Anastrazole [JAN Mid 2024-] for now. HOLD OFF Abema [100 BID; off weekends] until next viist/1 month .Labs today-ANC- 1.0 [see below  re: infection issues] Hb-9-10; platelets-N. Re: Pulmonary nodule Will repeat imaging in 3 month; will order at next visit.    # Fever; nausea- no vomiting- s/p Tick bite- s/p Doxycycline- currently improved- but light headed.   # Hypotension: 88 systolic- sec to above- ? Dehydration/Poor po intake- sec to above. STOP lisinopril- recommend IVFs over 1 hour today  # incidental CT JULy 2024- Distal transverse, descending, and sigmoid diverticulosis, with fat stranding about the splenic flexure in the left upper quadrant. Correlate for signs and symptoms of acute diverticulitis- no symtoms; monitor  for now. Stable.    # Sinus Tachycardia- recommend/reminded to start low dose coreg 6.125 BID. Script sent-  stable.  # Bilateral itchy watery eyes and sneezing. No improvement from Montelukast-  stable.  # Hyponatremia [131]/CKD stage III- sec to poor hydration/diarrhea- ? abema- recommend increasing fluids/salt intake electrolytes.stable.  # Hypomagnesia: 1.4- on OTC mag-stable.  # DM- BG-13- s/p endocrinology evaluation; with diet and medications [trucilicity; Novolog]- stable.  # recurrent UTI/ ? Suprapubic tenderness- S/p eval with urology.  Monitor closely on Abema/with neutropenia- s/p evaluation with Consuello Masse re: vaginal atrophy today- stable.  # intermittent Hypercalcemia: Ca 10.4- monitor for now. FEB Vit D - 90; HOLD off calcium and Vit D- stable.  # Low B 12 [PCP]- on B12 PO. stable.  # IV access; PIV.   # DISPOSITION: #  recommend IVFs over 1 hour today # Follow-up in 1 month/Thursdays-MD labs CBC CMP; magnesium; CA 27-29; vitamin D level- 25; Dr. B   No orders of the defined types were placed in this encounter.  All questions were answered. The patient knows to call the clinic with any problems, questions or concerns.      Earna Coder, MD 04/29/2023 10:04 AM

## 2023-04-29 NOTE — Assessment & Plan Note (Addendum)
#   Recurrent breast cancer-ER positive PR negative ? HER-2/neu-currently on abema+ Anastrozole. JAN 2024-Discontinued faslodex sec to insurance issues.]  JULY 2024-  New small nodule of the superior segment right lower lobe measuring 0.7 cm, possibly infectious or inflammatory but suspicious for a pulmonary metastasis.  No evidence of lymphadenopathy or metastatic disease in the abdomen or pelvis.  # Continue Anastrazole [JAN Mid 2024-] for now. HOLD OFF Abema [100 BID; off weekends] until next viist/1 month .Labs today-ANC- 1.0 [see below re: infection issues] Hb-9-10; platelets-N. Re: Pulmonary nodule Will repeat imaging in 3 month; will order at next visit.    # Fever; nausea- no vomiting- s/p Tick bite- s/p Doxycycline- currently improved- but light headed.   # Hypotension: 88 systolic- sec to above- ? Dehydration/Poor po intake- sec to above. STOP lisinopril- recommend IVFs over 1 hour today  # incidental CT JULy 2024- Distal transverse, descending, and sigmoid diverticulosis, with fat stranding about the splenic flexure in the left upper quadrant. Correlate for signs and symptoms of acute diverticulitis- no symtoms; monitor for now. Stable.    # Sinus Tachycardia- recommend/reminded to start low dose coreg 6.125 BID. Script sent-  stable.  # Bilateral itchy watery eyes and sneezing. No improvement from Montelukast-  stable.  # Hyponatremia [131]/CKD stage III- sec to poor hydration/diarrhea- ? abema- recommend increasing fluids/salt intake electrolytes.stable.  # Hypomagnesia: 1.4- on OTC mag-stable.  # DM- BG-13- s/p endocrinology evaluation; with diet and medications [trucilicity; Novolog]- stable.  # recurrent UTI/ ? Suprapubic tenderness- S/p eval with urology.  Monitor closely on Abema/with neutropenia- s/p evaluation with Consuello Masse re: vaginal atrophy today- stable.  # intermittent Hypercalcemia: Ca 10.4- monitor for now. FEB Vit D - 90; HOLD off calcium and Vit D- stable.  #  Low B 12 [PCP]- on B12 PO. stable.  # IV access; PIV.   # DISPOSITION: #  recommend IVFs over 1 hour today # Follow-up in 1 month/Thursdays-MD labs CBC CMP; magnesium; CA 27-29; vitamin D level- 25; Dr. Leonard Schwartz

## 2023-04-29 NOTE — Progress Notes (Signed)
Tick bite 3 weeks ago, Made her very sick. She went to ED. Started on doxycycline which made her very dizzy. Had fever,nausea, weakness and fatigue. She has lost 7 lbs. She is now feeling much better. Started back on all of her meds and eating normally 4 days ago. This morning she still has weakness and lightheadedness. BP is low 88/64. Denies any pain.  She is pale.

## 2023-04-30 LAB — CANCER ANTIGEN 27.29: CA 27.29: 25.4 U/mL (ref 0.0–38.6)

## 2023-05-30 ENCOUNTER — Ambulatory Visit: Payer: PPO | Admitting: Internal Medicine

## 2023-05-30 ENCOUNTER — Ambulatory Visit: Payer: PPO

## 2023-05-30 ENCOUNTER — Other Ambulatory Visit: Payer: PPO

## 2023-06-01 ENCOUNTER — Encounter: Payer: Self-pay | Admitting: Internal Medicine

## 2023-06-02 ENCOUNTER — Inpatient Hospital Stay: Payer: PPO | Attending: Internal Medicine

## 2023-06-02 ENCOUNTER — Inpatient Hospital Stay: Payer: PPO | Admitting: Internal Medicine

## 2023-06-02 ENCOUNTER — Ambulatory Visit: Payer: PPO

## 2023-06-02 ENCOUNTER — Encounter: Payer: Self-pay | Admitting: Internal Medicine

## 2023-06-02 ENCOUNTER — Inpatient Hospital Stay: Payer: PPO

## 2023-06-02 VITALS — BP 81/50 | HR 86 | Temp 97.1°F | Ht 62.5 in | Wt 151.4 lb

## 2023-06-02 DIAGNOSIS — Z803 Family history of malignant neoplasm of breast: Secondary | ICD-10-CM | POA: Insufficient documentation

## 2023-06-02 DIAGNOSIS — C50511 Malignant neoplasm of lower-outer quadrant of right female breast: Secondary | ICD-10-CM

## 2023-06-02 DIAGNOSIS — Z86718 Personal history of other venous thrombosis and embolism: Secondary | ICD-10-CM | POA: Diagnosis not present

## 2023-06-02 DIAGNOSIS — Z7982 Long term (current) use of aspirin: Secondary | ICD-10-CM | POA: Diagnosis not present

## 2023-06-02 DIAGNOSIS — Z794 Long term (current) use of insulin: Secondary | ICD-10-CM | POA: Diagnosis not present

## 2023-06-02 DIAGNOSIS — Z7984 Long term (current) use of oral hypoglycemic drugs: Secondary | ICD-10-CM | POA: Diagnosis not present

## 2023-06-02 DIAGNOSIS — Z9013 Acquired absence of bilateral breasts and nipples: Secondary | ICD-10-CM | POA: Diagnosis not present

## 2023-06-02 DIAGNOSIS — C7800 Secondary malignant neoplasm of unspecified lung: Secondary | ICD-10-CM | POA: Insufficient documentation

## 2023-06-02 DIAGNOSIS — Z170 Estrogen receptor positive status [ER+]: Secondary | ICD-10-CM | POA: Diagnosis not present

## 2023-06-02 DIAGNOSIS — Z23 Encounter for immunization: Secondary | ICD-10-CM | POA: Insufficient documentation

## 2023-06-02 DIAGNOSIS — Z79811 Long term (current) use of aromatase inhibitors: Secondary | ICD-10-CM | POA: Diagnosis not present

## 2023-06-02 DIAGNOSIS — Z79899 Other long term (current) drug therapy: Secondary | ICD-10-CM | POA: Diagnosis not present

## 2023-06-02 DIAGNOSIS — Z7985 Long-term (current) use of injectable non-insulin antidiabetic drugs: Secondary | ICD-10-CM | POA: Insufficient documentation

## 2023-06-02 LAB — CMP (CANCER CENTER ONLY)
ALT: 39 U/L (ref 0–44)
AST: 57 U/L — ABNORMAL HIGH (ref 15–41)
Albumin: 3.4 g/dL — ABNORMAL LOW (ref 3.5–5.0)
Alkaline Phosphatase: 134 U/L — ABNORMAL HIGH (ref 38–126)
Anion gap: 7 (ref 5–15)
BUN: 17 mg/dL (ref 8–23)
CO2: 22 mmol/L (ref 22–32)
Calcium: 9 mg/dL (ref 8.9–10.3)
Chloride: 100 mmol/L (ref 98–111)
Creatinine: 1 mg/dL (ref 0.44–1.00)
GFR, Estimated: >60 mL/min (ref >60)
Glucose, Bld: 300 mg/dL — ABNORMAL HIGH (ref 70–99)
Potassium: 3.9 mmol/L (ref 3.5–5.1)
Sodium: 129 mmol/L — ABNORMAL LOW (ref 135–145)
Total Bilirubin: 0.7 mg/dL (ref 0.3–1.2)
Total Protein: 6.7 g/dL (ref 6.5–8.1)

## 2023-06-02 LAB — CBC WITH DIFFERENTIAL (CANCER CENTER ONLY)
Abs Immature Granulocytes: 0.02 10*3/uL (ref 0.00–0.07)
Basophils Absolute: 0.1 10*3/uL (ref 0.0–0.1)
Basophils Relative: 2 %
Eosinophils Absolute: 0.1 10*3/uL (ref 0.0–0.5)
Eosinophils Relative: 3 %
HCT: 32.3 % — ABNORMAL LOW (ref 36.0–46.0)
Hemoglobin: 10.9 g/dL — ABNORMAL LOW (ref 12.0–15.0)
Immature Granulocytes: 1 %
Lymphocytes Relative: 28 %
Lymphs Abs: 1.1 10*3/uL (ref 0.7–4.0)
MCH: 33.4 pg (ref 26.0–34.0)
MCHC: 33.7 g/dL (ref 30.0–36.0)
MCV: 99.1 fL (ref 80.0–100.0)
Monocytes Absolute: 0.3 10*3/uL (ref 0.1–1.0)
Monocytes Relative: 7 %
Neutro Abs: 2.4 10*3/uL (ref 1.7–7.7)
Neutrophils Relative %: 59 %
Platelet Count: 221 10*3/uL (ref 150–400)
RBC: 3.26 MIL/uL — ABNORMAL LOW (ref 3.87–5.11)
RDW: 13.2 % (ref 11.5–15.5)
WBC Count: 3.9 10*3/uL — ABNORMAL LOW (ref 4.0–10.5)
nRBC: 0 % (ref 0.0–0.2)

## 2023-06-02 LAB — VITAMIN D 25 HYDROXY (VIT D DEFICIENCY, FRACTURES): Vit D, 25-Hydroxy: 68.58 ng/mL (ref 30–100)

## 2023-06-02 LAB — MAGNESIUM: Magnesium: 1.8 mg/dL (ref 1.7–2.4)

## 2023-06-02 MED ORDER — INFLUENZA VAC A&B SURF ANT ADJ 0.5 ML IM SUSY
0.5000 mL | PREFILLED_SYRINGE | Freq: Once | INTRAMUSCULAR | Status: AC
  Administered 2023-06-02: 0.5 mL via INTRAMUSCULAR
  Filled 2023-06-02: qty 0.5

## 2023-06-02 NOTE — Progress Notes (Signed)
Haw been off lisinopril x1 month but did accidentally take this morning, BP 81/50.  Would like flu shot today.

## 2023-06-02 NOTE — Progress Notes (Signed)
[name] OFFICE PROGRESS NOTE  Patient Care Team: Luciana Axe, NP as PCP - General (Family Medicine) Cyndia Bent, MD (Inactive) (General Surgery) Harold Hedge, MD (Obstetrics and Gynecology) Carmina Miller, MD Johney Maine, MD (Unknown Physician Specialty) Earna Coder, MD as Consulting Physician (Hematology and Oncology)   Cancer Staging  Carcinoma of lower-outer quadrant of right breast in female, estrogen receptor positive [facility]) Staging form: Breast, AJCC 7th Edition - Pathologic: ypT1c,ypN2a, MX - Signed by Currie Paris, MD on 03/10/2012 Cancer stage: ypT1c,ypN2a, MX    Oncology History Overview Note  # DEC 2012- RIGHT BREAST CA T2 N2 M0 tumor from biopsy.  Estrogen receptor positive, Progesterone receptor positive.  Current receptor negative by FISH 2. Neoadjuvant chemotherapy started in December of 28 with Cytoxan Adriamycin 3. Started on Taxol weekly chemotherapy. 4. Patient finished 12  cycles of Taxol chemotherapy in May of 2013.     5. Status post right modified radical mastectomy [Dr.Bowers; [place]] June of 2013, ypT1c  yp N2  MO. started also on Lerazole    7. Radiation therapy to the right breast (September of 2013).  Lymph node was positive for HER-2 receptor gene amplification of 2.22.  Will proceed with Herceptin treatment starting in September of 2013.   8.Patient has finished Herceptin (maintenance therapy) in August of 2014 8. Start patient on letrozole from November, 2013. 9. Patient started on Herceptin in September 2013.    10Patient finished 12 months of Herceptin therapy on August, 2014  # 6. Status post left side prophylactic mastectomy.  #Late MAY 2018-RECURRENCE BREAST CA- ER positive/PR negative; ?? HER-2/neu- [biopsy- proven-mediastinal lymph node; in suff for her 2 testing].  [elevated Tumor marker- CT/PET- uptake in Right Mediastinal LN; Sternum [June 2018 EBUS- Dr.Kasa]   # March 10 2017- faslodex +  Abema; OCT 5th CT-PR of mediastinal LN [consent]; Discontinued faslodex sec to insurance issues [last DEC 2023]. JAN 2024- continue Abema + Anastrzole   #August 2020-left chest wall nodule biopsy benign  #August 2019- DVT-right upper extremity; Eliquis; stop end of March 2021 [repeat ultrasound negative/patient preference]; # JAN 2022-Cirrhosis- ? On CT scan-FEB liver- Negative fro overt cirrhosis --------------------------------------------------------------- -    DIAGNOSIS: [ BREAST CANCER- ER/PR/HER2 NEU POS  STAGE:  4   ;GOALS: PALLIATIVE     Carcinoma of lower-outer quadrant of right breast in female, estrogen receptor positive (HCC)   INTERVAL HISTORY: Alone.  Ambulating independently.  Kimberly [name] 67 y.o.  female pleasant patient above history of metastatic ER PR positive HER-2/neu positive breast cancer currently on abema+ Anastrazole  is here for follow-up.  Patient had been off lisinopril x1 month but did accidentally take this morning, BP 81/50.    She is now feeling much better. Started back on all of her meds and eating normally 4 days ago.   Patient appears pale.   Review of Systems  Constitutional:  Positive for malaise/fatigue. Negative for chills, diaphoresis, fever and weight loss.  HENT:  Negative for nosebleeds and sore throat.   Eyes:  Negative for double vision.  Respiratory:  Negative for cough, hemoptysis, sputum production, shortness of breath and wheezing.   Cardiovascular:  Negative for chest pain, palpitations, orthopnea and leg swelling.  Gastrointestinal:  Negative for abdominal pain, blood in stool, constipation, heartburn, melena, nausea and vomiting.  Musculoskeletal:  Positive for back pain and joint pain.  Neurological:  Negative for dizziness, tingling, focal weakness, weakness and headaches.  Endo/Heme/Allergies:  Does not bruise/bleed easily.  Psychiatric/Behavioral:  Negative for depression. The patient is not nervous/anxious  and does not have insomnia.       PAST MEDICAL HISTORY :  Past Medical History:  Diagnosis Date   Arthritis    Cancer of lower-outer quadrant of female breast (HCC) 08/06/2011   RIGHT, chemo and bilateral mastectomies.   High cholesterol    History of kidney stones    Hx of bilateral breast implants    Hypertension    Lung metastases 20118   chemo pills and faslidex shots.   PONV (postoperative nausea and vomiting)    Type II diabetes mellitus (HCC)    fasting 140-150   Vertigo    LAST WEEK   Vertigo 01/2017    PAST SURGICAL HISTORY :   Past Surgical History:  Procedure Laterality Date   BREAST BIOPSY  07/2011, 2020   right   BREAST RECONSTRUCTION  03/07/2012   Procedure: BREAST RECONSTRUCTION;  Surgeon: Etter Sjogren, MD;  Location: [facility] OR;  Service: Plastics;  Laterality: Left;  BREAST RECONSTRUCTION WITH PLACEMENT OF TISSUE EXPANDER TO LEFT BREAST   BREAST RECONSTRUCTION  02/06/2013   CESAREAN SECTION  [id number],[id number]   ENDOBRONCHIAL ULTRASOUND N/A 02/24/2017   Procedure: ENDOBRONCHIAL ULTRASOUND;  Surgeon: Erin Fulling, MD;  Location: [facility] ORS;  Service: Cardiopulmonary;  Laterality: N/A;   LATISSIMUS FLAP TO BREAST Right 02/06/2013   Procedure: LATISSIMUS FLAP TO RIGHT BREAST WITH IMPLANT;  Surgeon: Etter Sjogren, MD;  Location: [facility] OR;  Service: Plastics;  Laterality: Right;   MASTECTOMY  03/07/12   modified right; total left   MODIFIED MASTECTOMY  03/07/2012   Procedure: MODIFIED MASTECTOMY;  Surgeon: Currie Paris, MD;  Location: [facility] OR;  Service: General;  Laterality: Right;   PORTACATH PLACEMENT  07/2011   SCAR REVISION  03/30/2012   Procedure: SCAR REVISION;  Surgeon: Currie Paris, MD;  Location: [facility];  Service: General;  Laterality: Right;  CLOSURE OF MASTECTOMY INCISION   WISDOM TOOTH EXTRACTION      FAMILY HISTORY :   Family History  Problem Relation Age of Onset   Breast cancer Mother    Diabetes Father     SOCIAL HISTORY:   Social  History   Tobacco Use   Smoking status: Never   Smokeless tobacco: Never  Vaping Use   Vaping status: Never Used  Substance Use Topics   Alcohol use: No   Drug use: No    ALLERGIES:  is allergic to sulfa antibiotics.  MEDICATIONS:  Current Outpatient Medications  Medication Sig Dispense Refill   albuterol (VENTOLIN HFA) 108 (90 Base) MCG/ACT inhaler Inhale 1-2 puffs into the lungs every 6 (six) hours as needed for wheezing or shortness of breath. 8 g 2   anastrozole (ARIMIDEX) 1 MG tablet TAKE 1 TABLET BY MOUTH EVERY DAY 90 tablet 2   aspirin EC 81 MG tablet Take 81 mg by mouth daily.     atorvastatin (LIPITOR) 20 MG tablet Take 1 tablet (20 mg total) by mouth daily. 90 tablet 3   Calcium Carb-Cholecalciferol 500-10 MG-MCG TABS Take by mouth.     carvedilol (COREG) 6.25 MG tablet Take 1 tablet (6.25 mg total) by mouth 2 (two) times daily with a meal. 60 tablet 3   Cholecalciferol 50 MCG (2000 UT) CAPS Take 3 capsules daily for 3 months, then reduce to 1 capsule daily thereafter for Vitamin D Deficiency.     clotrimazole-betamethasone (LOTRISONE) cream SMARTSIG:Topical Morning-Evening     Cranberry-Vitamin C-Probiotic (AZO CRANBERRY PO)  Take 1 Capful by mouth daily at 12 noon.     cyanocobalamin (VITAMIN B12) 1000 MCG tablet 1,000 mcg daily. Take 2 tablets daily for 2 weeks, then reduce to 1 tablet daily thereafter for Vitamin B12 Deficiency.     glipiZIDE (GLUCOTROL XL) 10 MG 24 hr tablet Take 1 tablet (10 mg total) by mouth 2 (two) times daily. 180 tablet 3   insulin aspart (NOVOLOG FLEXPEN) 100 UNIT/ML FlexPen Inject 4 Units into the skin 3 (three) times daily with meals. 15 mL 0   Insulin Detemir (LEVEMIR) 100 UNIT/ML Pen Inject 20 Units into the skin at bedtime. 15 mL 0   levothyroxine (SYNTHROID) 25 MCG tablet Take 1 tablet (25 mcg total) by mouth daily before breakfast. 90 tablet 1   loperamide (IMODIUM A-D) 2 MG tablet Take 2 mg by mouth 4 (four) times daily as needed for  diarrhea or loose stools.     magnesium oxide (MAG-OX) 400 MG tablet Take 1 tablet by mouth daily.     Melatonin 10 MG TABS Take 1 tablet by mouth at bedtime as needed.     ondansetron (ZOFRAN-ODT) 4 MG disintegrating tablet Take 1 tablet (4 mg total) by mouth every 8 (eight) hours as needed for nausea or vomiting. 30 tablet 3   potassium chloride (KLOR-CON M) 10 MEQ tablet Take 1 tablet (10 mEq total) by mouth daily. 7 tablet 0   promethazine (PHENERGAN) 25 MG tablet Take 1 tablet (25 mg total) by mouth every 8 (eight) hours as needed for nausea or vomiting. 15 tablet 0   TRULICITY 0.75 MG/0.5ML SOPN SMARTSIG:0.5 Milliliter(s) SUB-Q Once a Week     abemaciclib (VERZENIO) 100 MG tablet TAKE 1 TABLET BY MOUTH TWICE DAILY. DO NOT CHEW, CRUSH, OR SPLIT TABLETS BEFORE SWALLOWING (Patient not taking: Reported on 06/02/2023) 56 tablet 5   lisinopril (ZESTRIL) 10 MG tablet Take 10 mg by mouth daily. (Patient not taking: Reported on 06/02/2023)     No current facility-administered medications for this visit.    PHYSICAL EXAMINATION: ECOG PERFORMANCE STATUS: 1 - Symptomatic but completely ambulatory  BP (!) 81/50 (BP Location: Left Arm, Patient Position: Sitting, Cuff Size: Normal)   Pulse 86   Temp (!) 97.1 F (36.2 C) (Tympanic)   Ht 5' 2.5" (1.588 m)   Wt 151 lb 6.4 oz (68.7 kg)   SpO2 99%   BMI 27.25 kg/m   Filed Weights   06/02/23 1003  Weight: 151 lb 6.4 oz (68.7 kg)        Physical Exam Constitutional:      Comments: She is alone.   HENT:     Head: Normocephalic and atraumatic.     Mouth/Throat:     Pharynx: No oropharyngeal exudate.  Eyes:     Pupils: Pupils are equal, round, and reactive to light.  Cardiovascular:     Rate and Rhythm: Normal rate and regular rhythm.  Pulmonary:     Effort: Pulmonary effort is normal. No respiratory distress.     Breath sounds: Normal breath sounds. No wheezing.  Abdominal:     General: Bowel sounds are normal. There is no distension.      Palpations: Abdomen is soft. There is no mass.     Tenderness: There is no abdominal tenderness. There is no guarding or rebound.  Musculoskeletal:        General: No tenderness. Normal range of motion.     Cervical back: Normal range of motion and neck supple.  Comments: Approximately 1 cm hard nodule felt in the anterior chest wall/left parasternal.  Skin:    General: Skin is warm.  Neurological:     Mental Status: She is alert and oriented to person, place, and time.  Psychiatric:        Mood and Affect: Affect normal.    LABORATORY DATA:  I have reviewed the data as listed    Component Value Date/Time   NA 129 (L) 06/02/2023 0946   NA 135 11/28/2014 1401   K 3.9 06/02/2023 0946   K 4.7 11/28/2014 1401   CL 100 06/02/2023 0946   CL 99 (L) 11/28/2014 1401   CO2 22 06/02/2023 0946   CO2 28 11/28/2014 1401   GLUCOSE 300 (H) 06/02/2023 0946   GLUCOSE 215 (H) 11/28/2014 1401   BUN 17 06/02/2023 0946   BUN 16 11/28/2014 1401   CREATININE 1.00 06/02/2023 0946   CREATININE 0.96 11/28/2014 1401   CALCIUM 9.0 06/02/2023 0946   CALCIUM 9.9 11/28/2014 1401   PROT 6.7 06/02/2023 0946   PROT 7.6 11/28/2014 1401   ALBUMIN 3.4 (L) 06/02/2023 0946   ALBUMIN 4.5 11/28/2014 1401   AST 57 (H) 06/02/2023 0946   ALT 39 06/02/2023 0946   ALT 28 11/28/2014 1401   ALKPHOS 134 (H) 06/02/2023 0946   ALKPHOS 78 11/28/2014 1401   BILITOT 0.7 06/02/2023 0946   GFRNONAA >60 06/02/2023 0946   GFRNONAA >60 11/28/2014 1401   GFRAA >60 05/30/2020 1022   GFRAA >60 11/28/2014 1401    No results found for: "SPEP", "UPEP"  Lab Results  Component Value Date   WBC 3.9 (L) 06/02/2023   NEUTROABS 2.4 06/02/2023   HGB 10.9 (L) 06/02/2023   HCT 32.3 (L) 06/02/2023   MCV 99.1 06/02/2023   PLT 221 06/02/2023      Chemistry      Component Value Date/Time   NA 129 (L) 06/02/2023 0946   NA 135 11/28/2014 1401   K 3.9 06/02/2023 0946   K 4.7 11/28/2014 1401   CL 100 06/02/2023 0946   CL  99 (L) 11/28/2014 1401   CO2 22 06/02/2023 0946   CO2 28 11/28/2014 1401   BUN 17 06/02/2023 0946   BUN 16 11/28/2014 1401   CREATININE 1.00 06/02/2023 0946   CREATININE 0.96 11/28/2014 1401      Component Value Date/Time   CALCIUM 9.0 06/02/2023 0946   CALCIUM 9.9 11/28/2014 1401   ALKPHOS 134 (H) 06/02/2023 0946   ALKPHOS 78 11/28/2014 1401   AST 57 (H) 06/02/2023 0946   ALT 39 06/02/2023 0946   ALT 28 11/28/2014 1401   BILITOT 0.7 06/02/2023 0946       RADIOGRAPHIC STUDIES: I have personally reviewed the radiological images as listed and agreed with the findings in the report. No results found.   ASSESSMENT & PLAN:  Carcinoma of lower-outer quadrant of right breast in female, estrogen receptor positive (HCC)  # Recurrent breast cancer-ER positive PR negative ? HER-2/neu-currently on abema+ Anastrozole. JAN 2024-Discontinued faslodex sec to insurance issues.]  JULY 2024-  New small nodule of the superior segment right lower lobe measuring 0.7 cm, possibly infectious or inflammatory but suspicious for a pulmonary metastasis.  No evidence of lymphadenopathy or metastatic disease in the abdomen or pelvis.  # Continue Anastrazole [JAN Mid 2024-] for now. Continue to HOLD OFF Abema [100 BID; off weekends] until next viist/1 month- given hypotension .Labs today-ANC- 2.4  [see below re: infection issues] Hb-9-10; platelets-N. Re: Pulmonary  nodule Will repeat imaging in 3 month; will order today.     # Hypotension: 88 systolic- [last 1 month]. NO significant symptoms at this time. RECOMMEND STOP lisinopril; AND STOP carvedilol [started for tachycardia- until next visit. Recommend checking BP at home- call us reading over th next few days.  Check AM cortisol/next viist.  # Bilateral itchy watery eyes and sneezing. No improvement from Montelukast-  stable.  # Hyponatremia [129]/CKD stage III- sec to poor hydration/diarrhea- ? abema- recommend increasing fluids/salt intake electrolytes.  CONTINUE TO HOLD verzinio at this time.   # Hypomagnesia: 1.4- on OTC mag-  stable.  # DM- BG-13- s/p endocrinology evaluation; with diet and medications [trucilicity; Novolog]-   stable.  # recurrent UTI/ ? Suprapubic tenderness- S/p eval with urology.  Monitor closely on Abema/with neutropenia- s/p evaluation with Consuello Masse re: vaginal atrophy today-  stable.  # intermittent Hypercalcemia: Ca 10.4- monitor for now. FEB Vit D - 90; HOLD off calcium and Vit D-   stable.  # Low B 12 [PCP]- on B12 PO.  stable.  # Flu shot today- 06/02/23.   # IV access; PIV.  # DISPOSITION: # flu shot today # Follow-up in 1 month/Thursdays-X-MD/APP labs CBC CMP; magnesium; CA 27-29; cortisol [9AM] CT chest non -contrast prior-Dr. B    Orders Placed This Encounter  Procedures   CT CHEST WO CONTRAST    Standing Status:   Future    Standing Expiration Date:   06/01/2024    Order Specific Question:   Preferred imaging location?    Answer:   [facility]    Order Specific Question:   Radiology Contrast Protocol - do NOT remove file path    Answer:   [email or web address]   Cortisol    Standing Status:   Future    Standing Expiration Date:   06/01/2024   Magnesium    Standing Status:   Future    Standing Expiration Date:   06/01/2024   CBC with Differential (Cancer Center Only)    Standing Status:   Future    Standing Expiration Date:   06/01/2024   CMP (Cancer Center only)    Standing Status:   Future    Standing Expiration Date:   06/01/2024   Cancer antigen 27.29    Standing Status:   Future    Standing Expiration Date:   06/01/2024   All questions were answered. The patient knows to call the clinic with any problems, questions or concerns.      Earna Coder, MD 06/02/2023 11:09 AM

## 2023-06-02 NOTE — Patient Instructions (Addendum)
#   RECOMMEND STOP lisinopril; AND STOP carvedilol- until next visit.   # Recommend checking BP at home- call us reading over th next few days.   # DONOT TAKE VERZINIO Until further directions.

## 2023-06-02 NOTE — Assessment & Plan Note (Addendum)
#   Recurrent breast cancer-ER positive PR negative ? HER-2/neu-currently on abema+ Anastrozole. JAN 2024-Discontinued faslodex sec to insurance issues.]  JULY 2024-  New small nodule of the superior segment right lower lobe measuring 0.7 cm, possibly infectious or inflammatory but suspicious for a pulmonary metastasis.  No evidence of lymphadenopathy or metastatic disease in the abdomen or pelvis.  # Continue Anastrazole [JAN Mid 2024-] for now. Continue to HOLD OFF Abema [100 BID; off weekends] until next viist/1 month- given hypotension .Labs today-ANC- 2.4  [see below re: infection issues] Hb-9-10; platelets-N. Re: Pulmonary nodule Will repeat imaging in 3 month; will order today.     # Hypotension: 88 systolic- [last 1 month]. NO significant symptoms at this time. RECOMMEND STOP lisinopril; AND STOP carvedilol [started for tachycardia- until next visit. Recommend checking BP at home- call us reading over th next few days.  Check AM cortisol/next viist.  # Bilateral itchy watery eyes and sneezing. No improvement from Montelukast-  stable.  # Hyponatremia [129]/CKD stage III- sec to poor hydration/diarrhea- ? abema- recommend increasing fluids/salt intake electrolytes. CONTINUE TO HOLD verzinio at this time.   # Hypomagnesia: 1.4- on OTC mag-  stable.  # DM- BG-13- s/p endocrinology evaluation; with diet and medications [trucilicity; Novolog]-   stable.  # recurrent UTI/ ? Suprapubic tenderness- S/p eval with urology.  Monitor closely on Abema/with neutropenia- s/p evaluation with Consuello Masse re: vaginal atrophy today-  stable.  # intermittent Hypercalcemia: Ca 10.4- monitor for now. FEB Vit D - 90; HOLD off calcium and Vit D-   stable.  # Low B 12 [PCP]- on B12 PO.  stable.  # Flu shot today- 06/02/23.   # IV access; PIV.  # DISPOSITION: # flu shot today # Follow-up in 1 month/Thursdays-X-MD/APP labs CBC CMP; magnesium; CA 27-29; cortisol [9AM] CT chest non -contrast prior-Dr. B

## 2023-06-03 LAB — CANCER ANTIGEN 27.29: CA 27.29: 23.2 U/mL (ref 0.0–38.6)

## 2023-07-07 ENCOUNTER — Ambulatory Visit
Admission: RE | Admit: 2023-07-07 | Discharge: 2023-07-07 | Disposition: A | Discharge status: Home / Self Care | Payer: PPO | Source: Ambulatory Visit | Attending: Internal Medicine | Admitting: Internal Medicine

## 2023-07-07 DIAGNOSIS — Z170 Estrogen receptor positive status [ER+]: Secondary | ICD-10-CM | POA: Diagnosis not present

## 2023-07-07 DIAGNOSIS — C50511 Malignant neoplasm of lower-outer quadrant of right female breast: Secondary | ICD-10-CM | POA: Diagnosis not present

## 2023-07-07 DIAGNOSIS — C3490 Malignant neoplasm of unspecified part of unspecified bronchus or lung: Secondary | ICD-10-CM | POA: Diagnosis not present

## 2023-07-07 DIAGNOSIS — I700 Atherosclerosis of aorta: Secondary | ICD-10-CM | POA: Diagnosis not present

## 2023-07-11 ENCOUNTER — Encounter: Payer: Self-pay | Admitting: Internal Medicine

## 2023-07-14 ENCOUNTER — Encounter: Payer: Self-pay | Admitting: Internal Medicine

## 2023-07-14 ENCOUNTER — Ambulatory Visit: Payer: PPO | Admitting: Internal Medicine

## 2023-07-14 ENCOUNTER — Other Ambulatory Visit: Payer: PPO

## 2023-07-14 ENCOUNTER — Inpatient Hospital Stay: Payer: PPO | Attending: Internal Medicine

## 2023-07-14 ENCOUNTER — Inpatient Hospital Stay: Payer: PPO | Admitting: Internal Medicine

## 2023-07-14 VITALS — BP 127/73 | HR 100 | Temp 96.9°F | Ht 62.5 in | Wt 151.8 lb

## 2023-07-14 DIAGNOSIS — Z794 Long term (current) use of insulin: Secondary | ICD-10-CM | POA: Diagnosis not present

## 2023-07-14 DIAGNOSIS — Z79899 Other long term (current) drug therapy: Secondary | ICD-10-CM | POA: Diagnosis not present

## 2023-07-14 DIAGNOSIS — Z7982 Long term (current) use of aspirin: Secondary | ICD-10-CM | POA: Insufficient documentation

## 2023-07-14 DIAGNOSIS — C50511 Malignant neoplasm of lower-outer quadrant of right female breast: Secondary | ICD-10-CM | POA: Insufficient documentation

## 2023-07-14 DIAGNOSIS — E7800 Pure hypercholesterolemia, unspecified: Secondary | ICD-10-CM | POA: Insufficient documentation

## 2023-07-14 DIAGNOSIS — Z170 Estrogen receptor positive status [ER+]: Secondary | ICD-10-CM | POA: Insufficient documentation

## 2023-07-14 DIAGNOSIS — Z79811 Long term (current) use of aromatase inhibitors: Secondary | ICD-10-CM | POA: Diagnosis not present

## 2023-07-14 DIAGNOSIS — E119 Type 2 diabetes mellitus without complications: Secondary | ICD-10-CM | POA: Diagnosis not present

## 2023-07-14 DIAGNOSIS — Z7984 Long term (current) use of oral hypoglycemic drugs: Secondary | ICD-10-CM | POA: Diagnosis not present

## 2023-07-14 DIAGNOSIS — Z7985 Long-term (current) use of injectable non-insulin antidiabetic drugs: Secondary | ICD-10-CM | POA: Diagnosis not present

## 2023-07-14 DIAGNOSIS — Z9013 Acquired absence of bilateral breasts and nipples: Secondary | ICD-10-CM | POA: Diagnosis not present

## 2023-07-14 DIAGNOSIS — C7800 Secondary malignant neoplasm of unspecified lung: Secondary | ICD-10-CM | POA: Insufficient documentation

## 2023-07-14 LAB — CMP (CANCER CENTER ONLY)
ALT: 30 U/L (ref 0–44)
AST: 64 U/L — ABNORMAL HIGH (ref 15–41)
Albumin: 3.8 g/dL (ref 3.5–5.0)
Alkaline Phosphatase: 74 U/L (ref 38–126)
Anion gap: 10 (ref 5–15)
BUN: 14 mg/dL (ref 8–23)
CO2: 24 mmol/L (ref 22–32)
Calcium: 9.7 mg/dL (ref 8.9–10.3)
Chloride: 103 mmol/L (ref 98–111)
Creatinine: 1.07 mg/dL — ABNORMAL HIGH (ref 0.44–1.00)
GFR, Estimated: 57 mL/min — ABNORMAL LOW (ref >60)
Glucose, Bld: 78 mg/dL (ref 70–99)
Potassium: 3.1 mmol/L — ABNORMAL LOW (ref 3.5–5.1)
Sodium: 137 mmol/L (ref 135–145)
Total Bilirubin: 0.8 mg/dL (ref <1.2)
Total Protein: 7.1 g/dL (ref 6.5–8.1)

## 2023-07-14 LAB — CBC WITH DIFFERENTIAL (CANCER CENTER ONLY)
Abs Immature Granulocytes: 0.03 10*3/uL (ref 0.00–0.07)
Basophils Absolute: 0 10*3/uL (ref 0.0–0.1)
Basophils Relative: 1 %
Eosinophils Absolute: 0.1 10*3/uL (ref 0.0–0.5)
Eosinophils Relative: 3 %
HCT: 34.4 % — ABNORMAL LOW (ref 36.0–46.0)
Hemoglobin: 11.6 g/dL — ABNORMAL LOW (ref 12.0–15.0)
Immature Granulocytes: 1 %
Lymphocytes Relative: 44 %
Lymphs Abs: 1.7 10*3/uL (ref 0.7–4.0)
MCH: 31.1 pg (ref 26.0–34.0)
MCHC: 33.7 g/dL (ref 30.0–36.0)
MCV: 92.2 fL (ref 80.0–100.0)
Monocytes Absolute: 0.2 10*3/uL (ref 0.1–1.0)
Monocytes Relative: 5 %
Neutro Abs: 1.7 10*3/uL (ref 1.7–7.7)
Neutrophils Relative %: 46 %
Platelet Count: 221 10*3/uL (ref 150–400)
RBC: 3.73 MIL/uL — ABNORMAL LOW (ref 3.87–5.11)
RDW: 13.6 % (ref 11.5–15.5)
WBC Count: 3.8 10*3/uL — ABNORMAL LOW (ref 4.0–10.5)
nRBC: 0 % (ref 0.0–0.2)

## 2023-07-14 LAB — CORTISOL: Cortisol, Plasma: 13.1 ug/dL

## 2023-07-14 LAB — MAGNESIUM: Magnesium: 1.5 mg/dL — ABNORMAL LOW (ref 1.7–2.4)

## 2023-07-14 MED ORDER — CLOTRIMAZOLE-BETAMETHASONE 1-0.05 % EX CREA: TOPICAL_CREAM | Freq: Every day | CUTANEOUS | 0 refills | Status: DC | PRN

## 2023-07-14 NOTE — Assessment & Plan Note (Addendum)
#   Recurrent breast cancer-ER positive PR negative ? HER-2/neu-currently on abema+ Anastrozole. JULY 2024-  New small nodule of the superior segment right lower lobe measuring 0.7 cm, possibly infectious or inflammatory but suspicious for a pulmonary metastasis.  No evidence of lymphadenopathy or metastatic disease in the abdomen or pelvis.  # Continue Anastrazole [JAN Mid 2024-] for now. Continue with Abema [100 BID; off weekends] until next viist/1 month- given hypotension .Labs today-ANC- 2.4  Hb-9-10; platelets-N.  # July 2024 CT scan right upper lobe lung nodule-CT chest scan November 11th, 2024- interval resolution of the small irregular right upper lobe nodule seen on the most recent study. No new or enlarging pulmonary nodules identified.2. Stable chronic radiation changes anteriorly at the right lung apex. Stable.   # Hypotension: SEP 88 systolic- [last 2  month]. NO significant symptoms at this time. STOPPED lisinopril; AND contineu to HOLD carvedilol [started for tachycardia- until next visit. Will consider starting carvedilol  at next time. Improved/ stable.   # Hyponatremia [129]/CKD stage III- sec to poor hydration/diarrhea- ? abema- recommend increasing fluids/salt intake electrolytes- Improved/ stable.   # Hypomagnesia: 1.4- on OTC mag-  stable.  # DM- BG-13- s/p endocrinology evaluation; with diet and medications [trucilicity; Novolog]-   stable.  # recurrent UTI/ ? Suprapubic tenderness- S/p eval with urology.  Monitor closely on Abema/with neutropenia- s/p evaluation with Consuello Masse re: vaginal atrophy today-  stable.  # intermittent Hypercalcemia: Ca 10.4- monitor for now. FEB Vit D - 90; HOLD off calcium and Vit D-  stable.  # Low B 12 [PCP]- on B12 PO.  stable.  # Flu shot today- 06/02/23.   # IV access; PIV.   # DISPOSITION:AM appt # Follow-up in 4 weeks-Thursdays-MD-  labs CBC CMP; magnesium; CA 27-29;-Dr. B

## 2023-07-14 NOTE — Progress Notes (Signed)
CT chest 07/07/23.  Pt asking if you can call in her clotrimazole cream, doesn't see gyn until next year?(Pended)

## 2023-07-14 NOTE — Progress Notes (Signed)
[name] OFFICE PROGRESS NOTE  Patient Care Team: Luciana Axe, NP as PCP - General (Family Medicine) Cyndia Bent, MD (Inactive) (General Surgery) Harold Hedge, MD (Obstetrics and Gynecology) Carmina Miller, MD Johney Maine, MD (Unknown Physician Specialty) Earna Coder, MD as Consulting Physician (Hematology and Oncology)   Cancer Staging  Carcinoma of lower-outer quadrant of right breast in female, estrogen receptor positive [facility]) Staging form: Breast, AJCC 7th Edition - Pathologic: ypT1c,ypN2a, MX - Signed by Currie Paris, MD on 03/10/2012 Cancer stage: ypT1c,ypN2a, MX    Oncology History Overview Note  # DEC 2012- RIGHT BREAST CA T2 N2 M0 tumor from biopsy.  Estrogen receptor positive, Progesterone receptor positive.  Current receptor negative by FISH 2. Neoadjuvant chemotherapy started in December of 28 with Cytoxan Adriamycin 3. Started on Taxol weekly chemotherapy. 4. Patient finished 12  cycles of Taxol chemotherapy in May of 2013.     5. Status post right modified radical mastectomy [Dr.Bowers; [place]] June of 2013, ypT1c  yp N2  MO. started also on Lerazole    7. Radiation therapy to the right breast (September of 2013).  Lymph node was positive for HER-2 receptor gene amplification of 2.22.  Will proceed with Herceptin treatment starting in September of 2013.   8.Patient has finished Herceptin (maintenance therapy) in August of 2014 8. Start patient on letrozole from November, 2013. 9. Patient started on Herceptin in September 2013.    10Patient finished 12 months of Herceptin therapy on August, 2014  # 6. Status post left side prophylactic mastectomy.  #Late MAY 2018-RECURRENCE BREAST CA- ER positive/PR negative; ?? HER-2/neu- [biopsy- proven-mediastinal lymph node; in suff for her 2 testing].  [elevated Tumor marker- CT/PET- uptake in Right Mediastinal LN; Sternum [June 2018 EBUS- Dr.Kasa]   # March 10 2017- faslodex +  Abema; OCT 5th CT-PR of mediastinal LN [consent]; Discontinued faslodex sec to insurance issues [last DEC 2023]. JAN 2024- continue Abema + Anastrzole   #August 2020-left chest wall nodule biopsy benign  #August 2019- DVT-right upper extremity; Eliquis; stop end of March 2021 [repeat ultrasound negative/patient preference]; # JAN 2022-Cirrhosis- ? On CT scan-FEB liver- Negative fro overt cirrhosis --------------------------------------------------------------- -    DIAGNOSIS: [ BREAST CANCER- ER/PR/HER2 NEU POS  STAGE:  4   ;GOALS: PALLIATIVE     Carcinoma of lower-outer quadrant of right breast in female, estrogen receptor positive (HCC)   INTERVAL HISTORY: Alone.  Ambulating independently.  Kimberly [name] 67 y.o.  female pleasant patient above history of metastatic ER PR positive HER-2/neu positive breast cancer currently on abema+ Anastrazole  is here for follow-up/ and review results of the CT scan  Patient had been off lisinopril x2  months; and Coreg for the last 1 month because of low blood pressures. Abema: Hold for the last 2 months because of hypotension neutropenia.  Patient restarted abema approximately 3 days ago.  She is now feeling much better. Started back on all of her meds and eating normally 4 days ago.    Review of Systems  Constitutional:  Positive for malaise/fatigue. Negative for chills, diaphoresis, fever and weight loss.  HENT:  Negative for nosebleeds and sore throat.   Eyes:  Negative for double vision.  Respiratory:  Negative for cough, hemoptysis, sputum production, shortness of breath and wheezing.   Cardiovascular:  Negative for chest pain, palpitations, orthopnea and leg swelling.  Gastrointestinal:  Negative for abdominal pain, blood in stool, constipation, heartburn, melena, nausea and vomiting.  Musculoskeletal:  Positive  for back pain and joint pain.  Neurological:  Negative for dizziness, tingling, focal weakness, weakness and headaches.   Endo/Heme/Allergies:  Does not bruise/bleed easily.  Psychiatric/Behavioral:  Negative for depression. The patient is not nervous/anxious and does not have insomnia.       PAST MEDICAL HISTORY :  Past Medical History:  Diagnosis Date   Arthritis    Cancer of lower-outer quadrant of female breast (HCC) 08/06/2011   RIGHT, chemo and bilateral mastectomies.   High cholesterol    History of kidney stones    Hx of bilateral breast implants    Hypertension    Lung metastases 20118   chemo pills and faslidex shots.   PONV (postoperative nausea and vomiting)    Type II diabetes mellitus (HCC)    fasting 140-150   Vertigo    LAST WEEK   Vertigo 01/2017    PAST SURGICAL HISTORY :   Past Surgical History:  Procedure Laterality Date   BREAST BIOPSY  07/2011, 2020   right   BREAST RECONSTRUCTION  03/07/2012   Procedure: BREAST RECONSTRUCTION;  Surgeon: Etter Sjogren, MD;  Location: [facility] OR;  Service: Plastics;  Laterality: Left;  BREAST RECONSTRUCTION WITH PLACEMENT OF TISSUE EXPANDER TO LEFT BREAST   BREAST RECONSTRUCTION  02/06/2013   CESAREAN SECTION  2956,2130   ENDOBRONCHIAL ULTRASOUND N/A 02/24/2017   Procedure: ENDOBRONCHIAL ULTRASOUND;  Surgeon: Erin Fulling, MD;  Location: [facility] ORS;  Service: Cardiopulmonary;  Laterality: N/A;   LATISSIMUS FLAP TO BREAST Right 02/06/2013   Procedure: LATISSIMUS FLAP TO RIGHT BREAST WITH IMPLANT;  Surgeon: Etter Sjogren, MD;  Location: [facility] OR;  Service: Plastics;  Laterality: Right;   MASTECTOMY  03/07/12   modified right; total left   MODIFIED MASTECTOMY  03/07/2012   Procedure: MODIFIED MASTECTOMY;  Surgeon: Currie Paris, MD;  Location: [facility] OR;  Service: General;  Laterality: Right;   PORTACATH PLACEMENT  07/2011   SCAR REVISION  03/30/2012   Procedure: SCAR REVISION;  Surgeon: Currie Paris, MD;  Location:  SURGERY CENTER;  Service: General;  Laterality: Right;  CLOSURE OF MASTECTOMY INCISION   WISDOM TOOTH EXTRACTION      FAMILY  HISTORY :   Family History  Problem Relation Age of Onset   Breast cancer Mother    Diabetes Father     SOCIAL HISTORY:   Social History   Tobacco Use   Smoking status: Never   Smokeless tobacco: Never  Vaping Use   Vaping status: Never Used  Substance Use Topics   Alcohol use: No   Drug use: No    ALLERGIES:  is allergic to sulfa antibiotics.  MEDICATIONS:  Current Outpatient Medications  Medication Sig Dispense Refill   abemaciclib (VERZENIO) 100 MG tablet TAKE 1 TABLET BY MOUTH TWICE DAILY. DO NOT CHEW, CRUSH, OR SPLIT TABLETS BEFORE SWALLOWING 56 tablet 5   albuterol (VENTOLIN HFA) 108 (90 Base) MCG/ACT inhaler Inhale 1-2 puffs into the lungs every 6 (six) hours as needed for wheezing or shortness of breath. 8 g 2   anastrozole (ARIMIDEX) 1 MG tablet TAKE 1 TABLET BY MOUTH EVERY DAY 90 tablet 2   aspirin EC 81 MG tablet Take 81 mg by mouth daily.     atorvastatin (LIPITOR) 20 MG tablet Take 1 tablet (20 mg total) by mouth daily. 90 tablet 3   Calcium Carb-Cholecalciferol 500-10 MG-MCG TABS Take by mouth.     Cholecalciferol 50 MCG (2000 UT) CAPS Take 3 capsules daily for 3 months, then reduce  to 1 capsule daily thereafter for Vitamin D Deficiency.     Cranberry-Vitamin C-Probiotic (AZO CRANBERRY PO) Take 1 Capful by mouth daily at 12 noon.     cyanocobalamin (VITAMIN B12) 1000 MCG tablet 1,000 mcg daily. Take 2 tablets daily for 2 weeks, then reduce to 1 tablet daily thereafter for Vitamin B12 Deficiency.     glipiZIDE (GLUCOTROL XL) 10 MG 24 hr tablet Take 1 tablet (10 mg total) by mouth 2 (two) times daily. 180 tablet 3   insulin aspart (NOVOLOG FLEXPEN) 100 UNIT/ML FlexPen Inject 4 Units into the skin 3 (three) times daily with meals. 15 mL 0   Insulin Detemir (LEVEMIR) 100 UNIT/ML Pen Inject 20 Units into the skin at bedtime. 15 mL 0   levothyroxine (SYNTHROID) 25 MCG tablet Take 1 tablet (25 mcg total) by mouth daily before breakfast. 90 tablet 1   loperamide (IMODIUM  A-D) 2 MG tablet Take 2 mg by mouth 4 (four) times daily as needed for diarrhea or loose stools.     magnesium oxide (MAG-OX) 400 MG tablet Take 1 tablet by mouth daily.     Melatonin 10 MG TABS Take 1 tablet by mouth at bedtime as needed.     ondansetron (ZOFRAN-ODT) 4 MG disintegrating tablet Take 1 tablet (4 mg total) by mouth every 8 (eight) hours as needed for nausea or vomiting. 30 tablet 3   potassium chloride (KLOR-CON M) 10 MEQ tablet Take 1 tablet (10 mEq total) by mouth daily. 7 tablet 0   promethazine (PHENERGAN) 25 MG tablet Take 1 tablet (25 mg total) by mouth every 8 (eight) hours as needed for nausea or vomiting. 15 tablet 0   TRULICITY 0.75 MG/0.5ML SOPN SMARTSIG:0.5 Milliliter(s) SUB-Q Once a Week     carvedilol (COREG) 6.25 MG tablet Take 1 tablet (6.25 mg total) by mouth 2 (two) times daily with a meal. 60 tablet 3   clotrimazole-betamethasone (LOTRISONE) cream Apply topically daily as needed. 30 g 0   lisinopril (ZESTRIL) 10 MG tablet Take 10 mg by mouth daily. (Patient not taking: Reported on 06/02/2023)     No current facility-administered medications for this visit.    PHYSICAL EXAMINATION: ECOG PERFORMANCE STATUS: 1 - Symptomatic but completely ambulatory  BP 127/73 (BP Location: Left Arm, Patient Position: Sitting, Cuff Size: Normal)   Pulse 100   Temp (!) 96.9 F (36.1 C) (Tympanic)   Ht 5' 2.5" (1.588 m)   Wt 151 lb 12.8 oz (68.9 kg)   SpO2 99%   BMI 27.32 kg/m   Filed Weights   07/14/23 0905  Weight: 151 lb 12.8 oz (68.9 kg)         Physical Exam Constitutional:      Comments: She is alone.   HENT:     Head: Normocephalic and atraumatic.     Mouth/Throat:     Pharynx: No oropharyngeal exudate.  Eyes:     Pupils: Pupils are equal, round, and reactive to light.  Cardiovascular:     Rate and Rhythm: Normal rate and regular rhythm.  Pulmonary:     Effort: Pulmonary effort is normal. No respiratory distress.     Breath sounds: Normal breath  sounds. No wheezing.  Abdominal:     General: Bowel sounds are normal. There is no distension.     Palpations: Abdomen is soft. There is no mass.     Tenderness: There is no abdominal tenderness. There is no guarding or rebound.  Musculoskeletal:  General: No tenderness. Normal range of motion.     Cervical back: Normal range of motion and neck supple.     Comments: Approximately 1 cm hard nodule felt in the anterior chest wall/left parasternal.  Skin:    General: Skin is warm.  Neurological:     Mental Status: She is alert and oriented to person, place, and time.  Psychiatric:        Mood and Affect: Affect normal.    LABORATORY DATA:  I have reviewed the data as listed    Component Value Date/Time   NA 137 07/14/2023 0855   NA 135 11/28/2014 1401   K 3.1 (L) 07/14/2023 0855   K 4.7 11/28/2014 1401   CL 103 07/14/2023 0855   CL 99 (L) 11/28/2014 1401   CO2 24 07/14/2023 0855   CO2 28 11/28/2014 1401   GLUCOSE 78 07/14/2023 0855   GLUCOSE 215 (H) 11/28/2014 1401   BUN 14 07/14/2023 0855   BUN 16 11/28/2014 1401   CREATININE 1.07 (H) 07/14/2023 0855   CREATININE 0.96 11/28/2014 1401   CALCIUM 9.7 07/14/2023 0855   CALCIUM 9.9 11/28/2014 1401   PROT 7.1 07/14/2023 0855   PROT 7.6 11/28/2014 1401   ALBUMIN 3.8 07/14/2023 0855   ALBUMIN 4.5 11/28/2014 1401   AST 64 (H) 07/14/2023 0855   ALT 30 07/14/2023 0855   ALT 28 11/28/2014 1401   ALKPHOS 74 07/14/2023 0855   ALKPHOS 78 11/28/2014 1401   BILITOT 0.8 07/14/2023 0855   GFRNONAA 57 (L) 07/14/2023 0855   GFRNONAA >60 11/28/2014 1401   GFRAA >60 05/30/2020 1022   GFRAA >60 11/28/2014 1401    No results found for: "SPEP", "UPEP"  Lab Results  Component Value Date   WBC 3.8 (L) 07/14/2023   NEUTROABS 1.7 07/14/2023   HGB 11.6 (L) 07/14/2023   HCT 34.4 (L) 07/14/2023   MCV 92.2 07/14/2023   PLT 221 07/14/2023      Chemistry      Component Value Date/Time   NA 137 07/14/2023 0855   NA 135 11/28/2014  1401   K 3.1 (L) 07/14/2023 0855   K 4.7 11/28/2014 1401   CL 103 07/14/2023 0855   CL 99 (L) 11/28/2014 1401   CO2 24 07/14/2023 0855   CO2 28 11/28/2014 1401   BUN 14 07/14/2023 0855   BUN 16 11/28/2014 1401   CREATININE 1.07 (H) 07/14/2023 0855   CREATININE 0.96 11/28/2014 1401      Component Value Date/Time   CALCIUM 9.7 07/14/2023 0855   CALCIUM 9.9 11/28/2014 1401   ALKPHOS 74 07/14/2023 0855   ALKPHOS 78 11/28/2014 1401   AST 64 (H) 07/14/2023 0855   ALT 30 07/14/2023 0855   ALT 28 11/28/2014 1401   BILITOT 0.8 07/14/2023 0855       RADIOGRAPHIC STUDIES: I have personally reviewed the radiological images as listed and agreed with the findings in the report. No results found.   ASSESSMENT & PLAN:  Carcinoma of lower-outer quadrant of right breast in female, estrogen receptor positive (HCC)  # Recurrent breast cancer-ER positive PR negative ? HER-2/neu-currently on abema+ Anastrozole. JULY 2024-  New small nodule of the superior segment right lower lobe measuring 0.7 cm, possibly infectious or inflammatory but suspicious for a pulmonary metastasis.  No evidence of lymphadenopathy or metastatic disease in the abdomen or pelvis.  # Continue Anastrazole [JAN Mid 2024-] for now. Continue with Abema [100 BID; off weekends] until next viist/1 month- given hypotension .  Labs today-ANC- 2.4  Hb-9-10; platelets-N.  # July 2024 CT scan right upper lobe lung nodule-CT chest scan November 11th, 2024- interval resolution of the small irregular right upper lobe nodule seen on the most recent study. No new or enlarging pulmonary nodules identified.2. Stable chronic radiation changes anteriorly at the right lung apex. Stable.   # Hypotension: SEP 88 systolic- [last 2  month]. NO significant symptoms at this time. STOPPED lisinopril; AND contineu to HOLD carvedilol [started for tachycardia- until next visit. Will consider starting carvedilol  at next time. Improved/ stable.   # Hyponatremia  [129]/CKD stage III- sec to poor hydration/diarrhea- ? abema- recommend increasing fluids/salt intake electrolytes- Improved/ stable.   # Hypomagnesia: 1.4- on OTC mag-  stable.  # DM- BG-13- s/p endocrinology evaluation; with diet and medications [trucilicity; Novolog]-   stable.  # recurrent UTI/ ? Suprapubic tenderness- S/p eval with urology.  Monitor closely on Abema/with neutropenia- s/p evaluation with Consuello Masse re: vaginal atrophy today-  stable.  # intermittent Hypercalcemia: Ca 10.4- monitor for now. FEB Vit D - 90; HOLD off calcium and Vit D-  stable.  # Low B 12 [PCP]- on B12 PO.  stable.  # Flu shot today- 06/02/23.   # IV access; PIV.   # DISPOSITION:AM appt # Follow-up in 4 weeks-Thursdays-MD-  labs CBC CMP; magnesium; CA 27-29;-Dr. B    Orders Placed This Encounter  Procedures   CMP (Cancer Center only)    Standing Status:   Future    Standing Expiration Date:   07/13/2024   CBC with Differential (Cancer Center Only)    Standing Status:   Future    Standing Expiration Date:   07/13/2024   Magnesium    Standing Status:   Future    Standing Expiration Date:   07/13/2024   Cancer antigen 27.29    Standing Status:   Future    Standing Expiration Date:   07/13/2024   All questions were answered. The patient knows to call the clinic with any problems, questions or concerns.      Earna Coder, MD 07/14/2023 10:19 AM

## 2023-07-15 LAB — CANCER ANTIGEN 27.29: CA 27.29: 28.3 U/mL (ref 0.0–38.6)

## 2023-08-10 ENCOUNTER — Telehealth: Payer: Self-pay

## 2023-08-10 NOTE — Telephone Encounter (Signed)
Oral Oncology Patient Advocate Encounter   **[employer] PAP to [facility] in Jan 2025**  Was successful in securing patient a $6,000.00 grant from [employer] to provide copayment coverage for [employer].  This will keep the out of pocket expense at $0.     I have spoken with the patient.    The billing information is as follows and has been shared with [facility].   Member ID: [id number] Group ID: [id number] RxBin: [id number] PCN: [id number] Dates of Eligibility: 07/12/23 through 07/11/24  Fund name:  Metastatic Breast Cancer.   Ardeen Fillers, CPhT Oncology Pharmacy Patient Advocate  [facility]  [phone] (phone) [phone] (fax) 08/10/2023 6:10 AM

## 2023-08-18 ENCOUNTER — Inpatient Hospital Stay: Payer: PPO

## 2023-08-18 ENCOUNTER — Encounter: Payer: Self-pay | Admitting: Internal Medicine

## 2023-08-18 ENCOUNTER — Inpatient Hospital Stay: Payer: PPO | Admitting: Internal Medicine

## 2023-09-01 ENCOUNTER — Other Ambulatory Visit: Payer: Self-pay | Admitting: Internal Medicine

## 2023-09-01 NOTE — Telephone Encounter (Signed)
 Called and left VM for patient to call me back to OnBoard and schedule delivery through [facility]. I will continue to reach out to patient.    Morene Potters, CPhT Oncology Pharmacy Patient Advocate  [facility]  [phone] (phone) [phone] (fax) 09/01/2023 9:37 AM

## 2023-09-05 ENCOUNTER — Other Ambulatory Visit: Payer: Self-pay | Admitting: Pharmacist

## 2023-09-05 ENCOUNTER — Other Ambulatory Visit: Payer: Self-pay

## 2023-09-05 ENCOUNTER — Telehealth: Payer: Self-pay

## 2023-09-05 ENCOUNTER — Other Ambulatory Visit (HOSPITAL_COMMUNITY): Payer: Self-pay

## 2023-09-05 ENCOUNTER — Encounter: Payer: Self-pay | Admitting: Internal Medicine

## 2023-09-05 DIAGNOSIS — C50511 Malignant neoplasm of lower-outer quadrant of right female breast: Secondary | ICD-10-CM

## 2023-09-05 MED ORDER — ABEMACICLIB 100 MG PO TABS
100.0000 mg | ORAL_TABLET | Freq: Two times a day (BID) | ORAL | 1 refills | Status: DC
  Filled 2023-09-05 – 2023-09-06 (×2): qty 56, 28d supply, fill #0
  Filled 2023-10-03: qty 56, 28d supply, fill #1

## 2023-09-05 NOTE — Telephone Encounter (Addendum)
 Oral Oncology Patient Advocate Encounter  Prior Authorization for Verzenio  has been approved.    PA# [id number]  Effective dates: 09/05/23 through 09/04/24  Patients co-pay is $0.00 with [employer].    Morene Potters, CPhT Oncology Pharmacy Patient Advocate  [facility]  [phone] (phone) [phone] (fax) 09/05/2023 12:00 PM

## 2023-09-05 NOTE — Progress Notes (Signed)
 Specialty Pharmacy Initiation Note   Kimberly [name] is a 68 y.o. female who will be followed by the specialty pharmacy service for RxSp Oncology    Review of administration, indication, effectiveness, safety, potential side effects, storage/disposable, and missed dose instructions occurred today for patient's specialty medication(s) Abemaciclib  (VERZENIO )     Patient/Caregiver did not have any additional questions or concerns.   Patient's therapy is appropriate to: Continue    Goals Addressed             This Visit's Progress    Slow Disease Progression       Patient is on track. Patient will maintain adherence        Patient switching from PAP to filling at [facility] (Specialty)  Renaee LOISE Ditch Specialty Pharmacist

## 2023-09-05 NOTE — Telephone Encounter (Signed)
 Oral Oncology Patient Advocate Encounter  Re-authorization   Received notification that prior authorization for Verzenio  is required.   PA submitted on 09/05/23  Key [id number]  Status is pending     Kimberly [name], CPhT Oncology Pharmacy Patient Advocate  [facility]  [phone] (phone) [phone] (fax) 09/05/2023 11:59 AM

## 2023-09-05 NOTE — Telephone Encounter (Signed)
 Patient successfully OnBoarded. Medication scheduled to be shipped on Tuesday, 09/06/23, for delivery on Wednesday, 09/07/23, from [facility] to patient's address. Patient also knows to call me at [phone] with any questions or concerns regarding receiving medication or if there is any unexpected change in co-pay.    Morene Potters, CPhT Oncology Pharmacy Patient Advocate  [facility]  [phone] (phone) [phone] (fax) 09/05/2023 11:48 AM

## 2023-09-05 NOTE — Progress Notes (Signed)
 Specialty Pharmacy Initial Fill Coordination Note  Kimberly [name] is a 68 y.o. female contacted today regarding initial fill of specialty medication(s) Abemaciclib  (VERZENIO )  Patient requested Delivery   Delivery date: 09/07/23   Verified address: [address] [name], [place] [address]  Medication will be filled on 09/06/23.   Patient is aware of $0.00 copayment.    Morene Potters, CPhT Oncology Pharmacy Patient Advocate  [facility]  [phone] (phone) [phone] (fax) 09/05/2023 11:46 AM

## 2023-09-06 ENCOUNTER — Other Ambulatory Visit (HOSPITAL_COMMUNITY): Payer: Self-pay

## 2023-09-06 ENCOUNTER — Other Ambulatory Visit: Payer: Self-pay

## 2023-09-07 ENCOUNTER — Other Ambulatory Visit: Payer: Self-pay

## 2023-09-08 ENCOUNTER — Other Ambulatory Visit: Payer: Self-pay

## 2023-09-08 ENCOUNTER — Other Ambulatory Visit (HOSPITAL_COMMUNITY): Payer: Self-pay

## 2023-09-09 ENCOUNTER — Other Ambulatory Visit: Payer: Self-pay

## 2023-09-12 ENCOUNTER — Other Ambulatory Visit: Payer: Self-pay

## 2023-09-20 ENCOUNTER — Inpatient Hospital Stay: Payer: PPO | Attending: Internal Medicine

## 2023-09-20 ENCOUNTER — Inpatient Hospital Stay: Payer: PPO | Admitting: Internal Medicine

## 2023-09-20 DIAGNOSIS — Z170 Estrogen receptor positive status [ER+]: Secondary | ICD-10-CM | POA: Insufficient documentation

## 2023-09-20 DIAGNOSIS — Z79899 Other long term (current) drug therapy: Secondary | ICD-10-CM | POA: Diagnosis not present

## 2023-09-20 DIAGNOSIS — Z794 Long term (current) use of insulin: Secondary | ICD-10-CM | POA: Insufficient documentation

## 2023-09-20 DIAGNOSIS — Z9013 Acquired absence of bilateral breasts and nipples: Secondary | ICD-10-CM | POA: Insufficient documentation

## 2023-09-20 DIAGNOSIS — Z79811 Long term (current) use of aromatase inhibitors: Secondary | ICD-10-CM | POA: Insufficient documentation

## 2023-09-20 DIAGNOSIS — E871 Hypo-osmolality and hyponatremia: Secondary | ICD-10-CM | POA: Diagnosis not present

## 2023-09-20 DIAGNOSIS — C7800 Secondary malignant neoplasm of unspecified lung: Secondary | ICD-10-CM | POA: Insufficient documentation

## 2023-09-20 DIAGNOSIS — I10 Essential (primary) hypertension: Secondary | ICD-10-CM | POA: Diagnosis not present

## 2023-09-20 DIAGNOSIS — Z86718 Personal history of other venous thrombosis and embolism: Secondary | ICD-10-CM | POA: Diagnosis not present

## 2023-09-20 DIAGNOSIS — C50511 Malignant neoplasm of lower-outer quadrant of right female breast: Secondary | ICD-10-CM | POA: Diagnosis not present

## 2023-09-20 DIAGNOSIS — E119 Type 2 diabetes mellitus without complications: Secondary | ICD-10-CM | POA: Diagnosis not present

## 2023-09-20 DIAGNOSIS — Z7984 Long term (current) use of oral hypoglycemic drugs: Secondary | ICD-10-CM | POA: Insufficient documentation

## 2023-09-20 DIAGNOSIS — Z1721 Progesterone receptor positive status: Secondary | ICD-10-CM | POA: Insufficient documentation

## 2023-09-20 LAB — CMP (CANCER CENTER ONLY)
ALT: 21 U/L (ref 0–44)
AST: 34 U/L (ref 15–41)
Albumin: 3.7 g/dL (ref 3.5–5.0)
Alkaline Phosphatase: 101 U/L (ref 38–126)
Anion gap: 11 (ref 5–15)
BUN: 17 mg/dL (ref 8–23)
CO2: 23 mmol/L (ref 22–32)
Calcium: 10.4 mg/dL — ABNORMAL HIGH (ref 8.9–10.3)
Chloride: 103 mmol/L (ref 98–111)
Creatinine: 0.94 mg/dL (ref 0.44–1.00)
GFR, Estimated: >60 mL/min (ref >60)
Glucose, Bld: 96 mg/dL (ref 70–99)
Potassium: 3.7 mmol/L (ref 3.5–5.1)
Sodium: 137 mmol/L (ref 135–145)
Total Bilirubin: 0.4 mg/dL (ref 0.0–1.2)
Total Protein: 7.5 g/dL (ref 6.5–8.1)

## 2023-09-20 LAB — CBC WITH DIFFERENTIAL (CANCER CENTER ONLY)
Abs Immature Granulocytes: 0.01 10*3/uL (ref 0.00–0.07)
Basophils Absolute: 0.1 10*3/uL (ref 0.0–0.1)
Basophils Relative: 2 %
Eosinophils Absolute: 0.1 10*3/uL (ref 0.0–0.5)
Eosinophils Relative: 2 %
HCT: 31 % — ABNORMAL LOW (ref 36.0–46.0)
Hemoglobin: 10.4 g/dL — ABNORMAL LOW (ref 12.0–15.0)
Immature Granulocytes: 0 %
Lymphocytes Relative: 47 %
Lymphs Abs: 1.4 10*3/uL (ref 0.7–4.0)
MCH: 32.1 pg (ref 26.0–34.0)
MCHC: 33.5 g/dL (ref 30.0–36.0)
MCV: 95.7 fL (ref 80.0–100.0)
Monocytes Absolute: 0.2 10*3/uL (ref 0.1–1.0)
Monocytes Relative: 6 %
Neutro Abs: 1.3 10*3/uL — ABNORMAL LOW (ref 1.7–7.7)
Neutrophils Relative %: 43 %
Platelet Count: 240 10*3/uL (ref 150–400)
RBC: 3.24 MIL/uL — ABNORMAL LOW (ref 3.87–5.11)
RDW: 16.6 % — ABNORMAL HIGH (ref 11.5–15.5)
WBC Count: 3.1 10*3/uL — ABNORMAL LOW (ref 4.0–10.5)
nRBC: 0 % (ref 0.0–0.2)

## 2023-09-20 LAB — MAGNESIUM: Magnesium: 1.8 mg/dL (ref 1.7–2.4)

## 2023-09-20 MED ORDER — MONTELUKAST SODIUM 10 MG PO TABS: 10.0000 mg | ORAL_TABLET | Freq: Every day | ORAL | 3 refills | Status: DC

## 2023-09-20 NOTE — Progress Notes (Signed)
Missed last appt due to vertigo. Having sinus issues.Eyes watering and itching, nose running, headache. No fever.Taking vezenio; no side effects. No hot flashes from arimidex. Denies achiness. Has chronic knee pain right. Appetite is good. Fatigued.

## 2023-09-20 NOTE — Progress Notes (Signed)
[name] OFFICE PROGRESS NOTE  Patient Care Team: Luciana Axe, NP as PCP - General (Family Medicine) Cyndia Bent, MD (Inactive) (General Surgery) Harold Hedge, MD (Obstetrics and Gynecology) Carmina Miller, MD Johney Maine, MD (Unknown Physician Specialty) Earna Coder, MD as Consulting Physician (Hematology and Oncology)   Cancer Staging  Carcinoma of lower-outer quadrant of right breast in female, estrogen receptor positive [facility]) Staging form: Breast, AJCC 7th Edition - Pathologic: ypT1c,ypN2a, MX - Signed by Currie Paris, MD on 03/10/2012 Cancer stage: ypT1c,ypN2a, MX    Oncology History Overview Note  # DEC 2012- RIGHT BREAST CA T2 N2 M0 tumor from biopsy.  Estrogen receptor positive, Progesterone receptor positive.  Current receptor negative by FISH 2. Neoadjuvant chemotherapy started in December of 28 with Cytoxan Adriamycin 3. Started on Taxol weekly chemotherapy. 4. Patient finished 12  cycles of Taxol chemotherapy in May of 2013.     5. Status post right modified radical mastectomy [Dr.Bowers; [place]] June of 2013, ypT1c  yp N2  MO. started also on Lerazole    7. Radiation therapy to the right breast (September of 2013).  Lymph node was positive for HER-2 receptor gene amplification of 2.22.  Will proceed with Herceptin treatment starting in September of 2013.   8.Patient has finished Herceptin (maintenance therapy) in August of 2014 8. Start patient on letrozole from November, 2013. 9. Patient started on Herceptin in September 2013.    10Patient finished 12 months of Herceptin therapy on August, 2014  # 6. Status post left side prophylactic mastectomy.  #Late MAY 2018-RECURRENCE BREAST CA- ER positive/PR negative; ?? HER-2/neu- [biopsy- proven-mediastinal lymph node; in suff for her 2 testing].  [elevated Tumor marker- CT/PET- uptake in Right Mediastinal LN; Sternum [June 2018 EBUS- Dr.Kasa]   # March 10 2017- faslodex +  Abema; OCT 5th CT-PR of mediastinal LN [consent]; Discontinued faslodex sec to insurance issues [last DEC 2023]. JAN 2024- continue Abema + Anastrzole   #August 2020-left chest wall nodule biopsy benign  #August 2019- DVT-right upper extremity; Eliquis; stop end of March 2021 [repeat ultrasound negative/patient preference]; # JAN 2022-Cirrhosis- ? On CT scan-FEB liver- Negative fro overt cirrhosis --------------------------------------------------------------- -    DIAGNOSIS: [ BREAST CANCER- ER/PR/HER2 NEU POS  STAGE:  4   ;GOALS: PALLIATIVE     Carcinoma of lower-outer quadrant of right breast in female, estrogen receptor positive (HCC)   INTERVAL HISTORY: Alone.  Ambulating independently.  Kimberly [name] 68 y.o.  female pleasant patient above history of metastatic ER PR positive HER-2/neu positive breast cancer currently on abema+ Anastrazole  is here for follow-up.  Missed last appt due to vertigo- with nausea- no vomiting. Currently resolved.   Having sinus issues.Eyes watering and itching, nose running.   No fever.Taking vezenio; no side effects. No hot flashes from arimidex. Denies achiness. Has chronic knee pain right. Appetite is good. Fatigued.   Review of Systems  Constitutional:  Positive for malaise/fatigue. Negative for chills, diaphoresis, fever and weight loss.  HENT:  Negative for nosebleeds and sore throat.   Eyes:  Negative for double vision.  Respiratory:  Negative for cough, hemoptysis, sputum production, shortness of breath and wheezing.   Cardiovascular:  Negative for chest pain, palpitations, orthopnea and leg swelling.  Gastrointestinal:  Negative for abdominal pain, blood in stool, constipation, heartburn, melena, nausea and vomiting.  Musculoskeletal:  Positive for back pain and joint pain.  Neurological:  Negative for dizziness, tingling, focal weakness, weakness and headaches.  Endo/Heme/Allergies:  Does not bruise/bleed easily.   Psychiatric/Behavioral:  Negative for depression. The patient is not nervous/anxious and does not have insomnia.       PAST MEDICAL HISTORY :  Past Medical History:  Diagnosis Date   Arthritis    Cancer of lower-outer quadrant of female breast (HCC) 08/06/2011   RIGHT, chemo and bilateral mastectomies.   High cholesterol    History of kidney stones    Hx of bilateral breast implants    Hypertension    Lung metastases 20118   chemo pills and faslidex shots.   PONV (postoperative nausea and vomiting)    Type II diabetes mellitus (HCC)    fasting 140-150   Vertigo    LAST WEEK   Vertigo 01/2017    PAST SURGICAL HISTORY :   Past Surgical History:  Procedure Laterality Date   BREAST BIOPSY  07/2011, 2020   right   BREAST RECONSTRUCTION  03/07/2012   Procedure: BREAST RECONSTRUCTION;  Surgeon: Etter Sjogren, MD;  Location[facility] OR;  Service: Plastics;  Laterality: Left;  BREAST RECONSTRUCTION WITH PLACEMENT OF TISSUE EXPANDER TO LEFT BREAST   BREAST RECONSTRUCTION  02/06/2013   CESAREAN SECTION  [id number]   ENDOBRONCHIAL ULTRASOUND N/A 02/24/2017   Procedure: ENDOBRONCHIAL ULTRASOUND;  Surgeon: Erin Fulling, MD;  Location: [facility] ORS;  Service: Cardiopulmonary;  Laterality: N/A;   LATISSIMUS FLAP TO BREAST Right 02/06/2013   Procedure: LATISSIMUS FLAP TO RIGHT BREAST WITH IMPLANT;  Surgeon: Etter Sjogren, MD;  Location: [facility] OR;  Service: Plastics;  Laterality: Right;   MASTECTOMY  03/07/12   modified right; total left   MODIFIED MASTECTOMY  03/07/2012   Procedure: MODIFIED MASTECTOMY;  Surgeon: Currie Paris, MD;  Location: [facility] OR;  Service: General;  Laterality: Right;   PORTACATH PLACEMENT  07/2011   SCAR REVISION  03/30/2012   Procedure: SCAR REVISION;  Surgeon: Currie Paris, MD;  Location: [facility];  Service: General;  Laterality: Right;  CLOSURE OF MASTECTOMY INCISION   WISDOM TOOTH EXTRACTION      FAMILY HISTORY :   Family History  Problem Relation Age of  Onset   Breast cancer Mother    Diabetes Father     SOCIAL HISTORY:   Social History   Tobacco Use   Smoking status: Never   Smokeless tobacco: Never  Vaping Use   Vaping status: Never Used  Substance Use Topics   Alcohol use: No   Drug use: No    ALLERGIES:  is allergic to sulfa antibiotics.  MEDICATIONS:  Current Outpatient Medications  Medication Sig Dispense Refill   abemaciclib (VERZENIO) 100 MG tablet Take 1 tablet (100 mg total) by mouth 2 (two) times daily. 56 tablet 1   albuterol (VENTOLIN HFA) 108 (90 Base) MCG/ACT inhaler Inhale 1-2 puffs into the lungs every 6 (six) hours as needed for wheezing or shortness of breath. 8 g 2   anastrozole (ARIMIDEX) 1 MG tablet TAKE 1 TABLET BY MOUTH EVERY DAY 90 tablet 2   atorvastatin (LIPITOR) 20 MG tablet Take 1 tablet (20 mg total) by mouth daily. 90 tablet 3   carvedilol (COREG) 6.25 MG tablet Take 1 tablet (6.25 mg total) by mouth 2 (two) times daily with a meal. 60 tablet 3   Cholecalciferol 50 MCG (2000 UT) CAPS Take 3 capsules daily for 3 months, then reduce to 1 capsule daily thereafter for Vitamin D Deficiency.     clotrimazole-betamethasone (LOTRISONE) cream Apply topically daily as needed. 30 g 0   Cranberry-Vitamin  C-Probiotic (AZO CRANBERRY PO) Take 1 Capful by mouth daily at 12 noon.     cyanocobalamin (VITAMIN B12) 1000 MCG tablet 1,000 mcg daily. Take 2 tablets daily for 2 weeks, then reduce to 1 tablet daily thereafter for Vitamin B12 Deficiency.     glipiZIDE (GLUCOTROL XL) 10 MG 24 hr tablet Take 1 tablet (10 mg total) by mouth 2 (two) times daily. 180 tablet 3   insulin aspart (NOVOLOG FLEXPEN) 100 UNIT/ML FlexPen Inject 4 Units into the skin 3 (three) times daily with meals. 15 mL 0   Insulin Detemir (LEVEMIR) 100 UNIT/ML Pen Inject 20 Units into the skin at bedtime. 15 mL 0   levothyroxine (SYNTHROID) 25 MCG tablet Take 1 tablet (25 mcg total) by mouth daily before breakfast. 90 tablet 1   magnesium oxide  (MAG-OX) 400 MG tablet Take 1 tablet by mouth daily.     Melatonin 10 MG TABS Take 1 tablet by mouth at bedtime as needed.     montelukast (SINGULAIR) 10 MG tablet Take 1 tablet (10 mg total) by mouth at bedtime. 30 tablet 3   potassium chloride (KLOR-CON M) 10 MEQ tablet Take 1 tablet (10 mEq total) by mouth daily. 7 tablet 0   loperamide (IMODIUM A-D) 2 MG tablet Take 2 mg by mouth 4 (four) times daily as needed for diarrhea or loose stools. (Patient not taking: Reported on 09/20/2023)     ondansetron (ZOFRAN-ODT) 4 MG disintegrating tablet Take 1 tablet (4 mg total) by mouth every 8 (eight) hours as needed for nausea or vomiting. (Patient not taking: Reported on 09/20/2023) 30 tablet 3   promethazine (PHENERGAN) 25 MG tablet Take 1 tablet (25 mg total) by mouth every 8 (eight) hours as needed for nausea or vomiting. (Patient not taking: Reported on 09/20/2023) 15 tablet 0   No current facility-administered medications for this visit.    PHYSICAL EXAMINATION: ECOG PERFORMANCE STATUS: 1 - Symptomatic but completely ambulatory  BP 134/72 (BP Location: Left Arm, Patient Position: Sitting)   Pulse 96   Temp (!) 97.4 F (36.3 C) (Tympanic)   Resp 16   Wt 155 lb (70.3 kg)   SpO2 100%   BMI 27.90 kg/m   Filed Weights   09/20/23 1028  Weight: 155 lb (70.3 kg)      Physical Exam Constitutional:      Comments: She is alone.   HENT:     Head: Normocephalic and atraumatic.     Mouth/Throat:     Pharynx: No oropharyngeal exudate.  Eyes:     Pupils: Pupils are equal, round, and reactive to light.  Cardiovascular:     Rate and Rhythm: Normal rate and regular rhythm.  Pulmonary:     Effort: Pulmonary effort is normal. No respiratory distress.     Breath sounds: Normal breath sounds. No wheezing.  Abdominal:     General: Bowel sounds are normal. There is no distension.     Palpations: Abdomen is soft. There is no mass.     Tenderness: There is no abdominal tenderness. There is no  guarding or rebound.  Musculoskeletal:        General: No tenderness. Normal range of motion.     Cervical back: Normal range of motion and neck supple.     Comments: Approximately 1 cm hard nodule felt in the anterior chest wall/left parasternal.  Skin:    General: Skin is warm.  Neurological:     Mental Status: She is alert and oriented to person,  place, and time.  Psychiatric:        Mood and Affect: Affect normal.    LABORATORY DATA:  I have reviewed the data as listed    Component Value Date/Time   NA 137 09/20/2023 1008   NA 135 11/28/2014 1401   K 3.7 09/20/2023 1008   K 4.7 11/28/2014 1401   CL 103 09/20/2023 1008   CL 99 (L) 11/28/2014 1401   CO2 23 09/20/2023 1008   CO2 28 11/28/2014 1401   GLUCOSE 96 09/20/2023 1008   GLUCOSE 215 (H) 11/28/2014 1401   BUN 17 09/20/2023 1008   BUN 16 11/28/2014 1401   CREATININE 0.94 09/20/2023 1008   CREATININE 0.96 11/28/2014 1401   CALCIUM 10.4 (H) 09/20/2023 1008   CALCIUM 9.9 11/28/2014 1401   PROT 7.5 09/20/2023 1008   PROT 7.6 11/28/2014 1401   ALBUMIN 3.7 09/20/2023 1008   ALBUMIN 4.5 11/28/2014 1401   AST 34 09/20/2023 1008   ALT 21 09/20/2023 1008   ALT 28 11/28/2014 1401   ALKPHOS 101 09/20/2023 1008   ALKPHOS 78 11/28/2014 1401   BILITOT 0.4 09/20/2023 1008   GFRNONAA >60 09/20/2023 1008   GFRNONAA >60 11/28/2014 1401   GFRAA >60 05/30/2020 1022   GFRAA >60 11/28/2014 1401    No results found for: "SPEP", "UPEP"  Lab Results  Component Value Date   WBC 3.1 (L) 09/20/2023   NEUTROABS 1.3 (L) 09/20/2023   HGB 10.4 (L) 09/20/2023   HCT 31.0 (L) 09/20/2023   MCV 95.7 09/20/2023   PLT 240 09/20/2023      Chemistry      Component Value Date/Time   NA 137 09/20/2023 1008   NA 135 11/28/2014 1401   K 3.7 09/20/2023 1008   K 4.7 11/28/2014 1401   CL 103 09/20/2023 1008   CL 99 (L) 11/28/2014 1401   CO2 23 09/20/2023 1008   CO2 28 11/28/2014 1401   BUN 17 09/20/2023 1008   BUN 16 11/28/2014 1401    CREATININE 0.94 09/20/2023 1008   CREATININE 0.96 11/28/2014 1401      Component Value Date/Time   CALCIUM 10.4 (H) 09/20/2023 1008   CALCIUM 9.9 11/28/2014 1401   ALKPHOS 101 09/20/2023 1008   ALKPHOS 78 11/28/2014 1401   AST 34 09/20/2023 1008   ALT 21 09/20/2023 1008   ALT 28 11/28/2014 1401   BILITOT 0.4 09/20/2023 1008       RADIOGRAPHIC STUDIES: I have personally reviewed the radiological images as listed and agreed with the findings in the report. No results found.   ASSESSMENT & PLAN:  Carcinoma of lower-outer quadrant of right breast in female, estrogen receptor positive (HCC)  # Recurrent breast cancer-ER positive PR negative ? HER-2/neu-currently on abema+ Anastrozole. JULY 2024- CT New small nodule of the superior segment right lower lobe measuring 0.7 cm, possibly infectious or inflammatory but suspicious for a pulmonary metastasis.  No evidence of lymphadenopathy or metastatic disease in the abdomen or pelvis. stable.  # Continue Anastrazole [JAN Mid 2024-] for now. Continue with Abema [100 BID; off weekends]  .Labs today-ANC- 1.6  Hb-9-10; platelets-N.  # July 2024 CT scan right upper lobe lung nodule-CT chest scan November 11th, 2024- interval resolution of the small irregular right upper lobe nodule seen on the most recent study. No new or enlarging pulmonary nodules identified. Stable chronic radiation changes anteriorly at the right lung apex. Stable.   # Hypotension: SEP 88 systolic- [last 2  month]. NO significant  symptoms at this time. STOPPED lisinopril;- OK to re-start Carvedilol  stable.  # Hyponatremia [129]/CKD stage III- sec to poor hydration/diarrhea- ? abema- recommend increasing fluids/salt intake electrolytes- Improved/  stable.  # Seasonal allergies-continue OTC antihistamines.  Add Singulair.  # Hypomagnesia: 1.4- on OTC mag-  stable.  # DM- BG-96 s/p endocrinology evaluation; with diet and medications [trucilicity; Novolog]-   stable.  #  recurrent UTI/ ? Suprapubic tenderness- S/p eval with urology.  Monitor closely on Abema/with neutropenia- s/p evaluation with Consuello Masse re: vaginal atrophy today-  stable.  # intermittent Hypercalcemia: Ca 10.4- monitor for now. FEB Vit D - 90; HOLD off calcium and Vit D-  stable.  # Low B 12 [PCP]- on B12 PO.  stable.  # Flu shot today- 06/02/23.   # IV access; PIV.   # DISPOSITION:AM appt # Follow-up in 16month- -MD-  labs CBC CMP; magnesium; CA 27-29; CT [facility]-Dr. B     Orders Placed This Encounter  Procedures   CT CHEST ABDOMEN PELVIS W CONTRAST    Standing Status:   Future    Expected Date:   10/21/2023    Expiration Date:   09/19/2024    If indicated for the ordered procedure, I authorize the administration of contrast media per Radiology protocol:   Yes    Does the patient have a contrast media/X-ray dye allergy?:   No    Preferred imaging location?:   [facility]    If indicated for the ordered procedure, I authorize the administration of oral contrast media per Radiology protocol:   Yes   CBC with Differential (Cancer Center Only)    Standing Status:   Future    Expected Date:   10/18/2023    Expiration Date:   09/19/2024   CMP (Cancer Center only)    Standing Status:   Future    Expected Date:   10/18/2023    Expiration Date:   09/19/2024   Magnesium    Standing Status:   Future    Expected Date:   10/18/2023    Expiration Date:   09/19/2024   Cancer antigen 27.29    Standing Status:   Future    Expected Date:   10/18/2023    Expiration Date:   09/19/2024   All questions were answered. The patient knows to call the clinic with any problems, questions or concerns.      Earna Coder, MD 09/20/2023 11:06 AM

## 2023-09-20 NOTE — Patient Instructions (Addendum)
STOP lisinopril;- OK to re-start Carvedilol   Verzinio [100 mg BID; off weekends]

## 2023-09-20 NOTE — Assessment & Plan Note (Addendum)
#   Recurrent breast cancer-ER positive PR negative ? HER-2/neu-currently on abema+ Anastrozole. JULY 2024- CT New small nodule of the superior segment right lower lobe measuring 0.7 cm, possibly infectious or inflammatory but suspicious for a pulmonary metastasis.  No evidence of lymphadenopathy or metastatic disease in the abdomen or pelvis. stable.  # Continue Anastrazole [JAN Mid 2024-] for now. Continue with Abema [100 BID; off weekends]  .Labs today-ANC- 1.6  Hb-9-10; platelets-N.  # July 2024 CT scan right upper lobe lung nodule-CT chest scan November 11th, 2024- interval resolution of the small irregular right upper lobe nodule seen on the most recent study. No new or enlarging pulmonary nodules identified. Stable chronic radiation changes anteriorly at the right lung apex. Stable.   # Hypotension: SEP 88 systolic- [last 2  month]. NO significant symptoms at this time. STOPPED lisinopril;- OK to re-start Carvedilol  stable.  # Hyponatremia [129]/CKD stage III- sec to poor hydration/diarrhea- ? abema- recommend increasing fluids/salt intake electrolytes- Improved/  stable.  # Seasonal allergies-continue OTC antihistamines.  Add Singulair.  # Hypomagnesia: 1.4- on OTC mag-  stable.  # DM- BG-96 s/p endocrinology evaluation; with diet and medications [trucilicity; Novolog]-   stable.  # recurrent UTI/ ? Suprapubic tenderness- S/p eval with urology.  Monitor closely on Abema/with neutropenia- s/p evaluation with Consuello Masse re: vaginal atrophy today-  stable.  # intermittent Hypercalcemia: Ca 10.4- monitor for now. FEB Vit D - 90; HOLD off calcium and Vit D-  stable.  # Low B 12 [PCP]- on B12 PO.  stable.  # Flu shot today- 06/02/23.   # IV access; PIV.   # DISPOSITION:AM appt # Follow-up in 40month- -MD-  labs CBC CMP; magnesium; CA 27-29; CT [facility]-Dr. B

## 2023-09-21 LAB — CANCER ANTIGEN 27.29: CA 27.29: 33.5 U/mL (ref 0.0–38.6)

## 2023-09-28 ENCOUNTER — Other Ambulatory Visit: Payer: Self-pay

## 2023-10-03 ENCOUNTER — Other Ambulatory Visit: Payer: Self-pay

## 2023-10-03 NOTE — Progress Notes (Signed)
Specialty Pharmacy Refill Coordination Note  Kimberly [name] is a 68 y.o. female contacted today regarding refills of specialty medication(s) Abemaciclib [name])   Patient requested Delivery   Delivery date: 10/07/23   Verified address: [address], [place], [place] [address]   Medication will be filled on 10/06/23.

## 2023-10-03 NOTE — Progress Notes (Signed)
Specialty Pharmacy Ongoing Clinical Assessment Note  Kimberly [name] is a 68 y.o. female who is being followed by the specialty pharmacy service for RxSp Oncology   Patient's specialty medication(s) reviewed today: Abemaciclib (VERZENIO)   Missed doses in the last 4 weeks: 1   Patient/Caregiver did not have any additional questions or concerns.   Therapeutic benefit summary: Patient is achieving benefit   Adverse events/side effects summary: No adverse events/side effects   Patient's therapy is appropriate to: Continue    Goals Addressed             This Visit's Progress    Slow Disease Progression   On track    Patient is on track. Patient will maintain adherence         Follow up:  6 months  Patient maintained on Verzenio therapy since 2018.   Otto Herb Specialty Pharmacist

## 2023-10-06 ENCOUNTER — Other Ambulatory Visit: Payer: Self-pay

## 2023-10-06 ENCOUNTER — Other Ambulatory Visit (HOSPITAL_COMMUNITY): Payer: Self-pay

## 2023-10-13 ENCOUNTER — Ambulatory Visit
Admission: RE | Admit: 2023-10-13 | Discharge: 2023-10-13 | Disposition: A | Discharge status: Home / Self Care | Payer: PPO | Source: Ambulatory Visit | Attending: Internal Medicine | Admitting: Internal Medicine

## 2023-10-13 DIAGNOSIS — C50511 Malignant neoplasm of lower-outer quadrant of right female breast: Secondary | ICD-10-CM | POA: Insufficient documentation

## 2023-10-13 DIAGNOSIS — J8410 Pulmonary fibrosis, unspecified: Secondary | ICD-10-CM | POA: Diagnosis not present

## 2023-10-13 DIAGNOSIS — Z170 Estrogen receptor positive status [ER+]: Secondary | ICD-10-CM | POA: Insufficient documentation

## 2023-10-13 DIAGNOSIS — C50911 Malignant neoplasm of unspecified site of right female breast: Secondary | ICD-10-CM | POA: Diagnosis not present

## 2023-10-13 DIAGNOSIS — N200 Calculus of kidney: Secondary | ICD-10-CM | POA: Diagnosis not present

## 2023-10-13 DIAGNOSIS — C7800 Secondary malignant neoplasm of unspecified lung: Secondary | ICD-10-CM | POA: Diagnosis not present

## 2023-10-13 MED ORDER — IOHEXOL 300 MG/ML  SOLN
100.0000 mL | Freq: Once | INTRAMUSCULAR | Status: AC | PRN
  Administered 2023-10-13: 100 mL via INTRAVENOUS

## 2023-10-21 ENCOUNTER — Inpatient Hospital Stay: Payer: PPO | Admitting: Internal Medicine

## 2023-10-21 ENCOUNTER — Encounter: Payer: Self-pay | Admitting: Internal Medicine

## 2023-10-21 ENCOUNTER — Inpatient Hospital Stay: Payer: PPO | Attending: Internal Medicine

## 2023-10-21 VITALS — BP 72/44 | HR 79 | Temp 96.9°F | Ht 62.5 in | Wt 154.4 lb

## 2023-10-21 DIAGNOSIS — C50511 Malignant neoplasm of lower-outer quadrant of right female breast: Secondary | ICD-10-CM | POA: Insufficient documentation

## 2023-10-21 DIAGNOSIS — Z1721 Progesterone receptor positive status: Secondary | ICD-10-CM | POA: Insufficient documentation

## 2023-10-21 DIAGNOSIS — Z79899 Other long term (current) drug therapy: Secondary | ICD-10-CM | POA: Insufficient documentation

## 2023-10-21 DIAGNOSIS — Z170 Estrogen receptor positive status [ER+]: Secondary | ICD-10-CM

## 2023-10-21 DIAGNOSIS — Z1732 Human epidermal growth factor receptor 2 negative status: Secondary | ICD-10-CM | POA: Insufficient documentation

## 2023-10-21 DIAGNOSIS — Z79811 Long term (current) use of aromatase inhibitors: Secondary | ICD-10-CM | POA: Diagnosis not present

## 2023-10-21 DIAGNOSIS — C7800 Secondary malignant neoplasm of unspecified lung: Secondary | ICD-10-CM | POA: Insufficient documentation

## 2023-10-21 DIAGNOSIS — I959 Hypotension, unspecified: Secondary | ICD-10-CM | POA: Diagnosis not present

## 2023-10-21 DIAGNOSIS — Z9013 Acquired absence of bilateral breasts and nipples: Secondary | ICD-10-CM | POA: Insufficient documentation

## 2023-10-21 LAB — CBC WITH DIFFERENTIAL (CANCER CENTER ONLY)
Abs Immature Granulocytes: 0.02 10*3/uL (ref 0.00–0.07)
Basophils Absolute: 0.1 10*3/uL (ref 0.0–0.1)
Basophils Relative: 3 %
Eosinophils Absolute: 0 10*3/uL (ref 0.0–0.5)
Eosinophils Relative: 1 %
HCT: 30 % — ABNORMAL LOW (ref 36.0–46.0)
Hemoglobin: 10.2 g/dL — ABNORMAL LOW (ref 12.0–15.0)
Immature Granulocytes: 1 %
Lymphocytes Relative: 43 %
Lymphs Abs: 1 10*3/uL (ref 0.7–4.0)
MCH: 34.1 pg — ABNORMAL HIGH (ref 26.0–34.0)
MCHC: 34 g/dL (ref 30.0–36.0)
MCV: 100.3 fL — ABNORMAL HIGH (ref 80.0–100.0)
Monocytes Absolute: 0.2 10*3/uL (ref 0.1–1.0)
Monocytes Relative: 7 %
Neutro Abs: 1.1 10*3/uL — ABNORMAL LOW (ref 1.7–7.7)
Neutrophils Relative %: 45 %
Platelet Count: 188 10*3/uL (ref 150–400)
RBC: 2.99 MIL/uL — ABNORMAL LOW (ref 3.87–5.11)
RDW: 13.4 % (ref 11.5–15.5)
WBC Count: 2.4 10*3/uL — ABNORMAL LOW (ref 4.0–10.5)
nRBC: 0 % (ref 0.0–0.2)

## 2023-10-21 LAB — MAGNESIUM: Magnesium: 1.4 mg/dL — ABNORMAL LOW (ref 1.7–2.4)

## 2023-10-21 LAB — CMP (CANCER CENTER ONLY)
ALT: 20 U/L (ref 0–44)
AST: 32 U/L (ref 15–41)
Albumin: 3.4 g/dL — ABNORMAL LOW (ref 3.5–5.0)
Alkaline Phosphatase: 62 U/L (ref 38–126)
Anion gap: 11 (ref 5–15)
BUN: 14 mg/dL (ref 8–23)
CO2: 26 mmol/L (ref 22–32)
Calcium: 10.2 mg/dL (ref 8.9–10.3)
Chloride: 101 mmol/L (ref 98–111)
Creatinine: 0.96 mg/dL (ref 0.44–1.00)
GFR, Estimated: >60 mL/min (ref >60)
Glucose, Bld: 101 mg/dL — ABNORMAL HIGH (ref 70–99)
Potassium: 3.2 mmol/L — ABNORMAL LOW (ref 3.5–5.1)
Sodium: 138 mmol/L (ref 135–145)
Total Bilirubin: 0.7 mg/dL (ref 0.0–1.2)
Total Protein: 6.4 g/dL — ABNORMAL LOW (ref 6.5–8.1)

## 2023-10-21 NOTE — Progress Notes (Signed)
C/o allergy symptoms. Eyes watering x6 weeks and drainage in back of throat. Wandering if she needs different rx?  Had CT CAP 10/13/23.  Pt would like future appts on any day but Friday.

## 2023-10-21 NOTE — Assessment & Plan Note (Addendum)
#   Recurrent breast cancer-ER positive PR negative ? HER-2/neu-currently on abema+ Anastrozole. JULY 2024- CT New small nodule of the superior segment right lower lobe measuring 0.7 cm, possibly infectious or inflammatory but suspicious for a pulmonary metastasis.  No evidence of lymphadenopathy or metastatic disease in the abdomen or pelvis. stable.  # Continue Anastrazole [JAN Mid 2024-] for now. Continue with Abema [100 BID; off weekends] Labs today-ANC- 1.1   Hb-9-10; platelets-N.  # July 2024 CT scan right upper lobe lung nodule-CT chest scan November 11th, 2024- interval resolution of the small irregular right upper lobe nodule seen on the most recent study. No new or enlarging pulmonary nodules identified. Stable chronic radiation changes anteriorly at the right lung apex.  Awaiting re-imaging results from FEB 2025.   # Hypotension: SEP 70-80s-systolic- [last 2  month]. NO significant symptoms at this time. STOPPED lisinopril;- OK to re-start Carvedilol but recommend taking 1/2 pill BID-  # CKD stage II- stable.   # Seasonal allergies-continue OTC antihistamines.  Added Singulair- not improved- discussed re: ENt referral; pt will talkto her PCP in [facility].   # Hypomagnesia: 1.4- on OTC mag-  stable.  # DM- BG- 101 s/p endocrinology evaluation; with diet and medications [trucilicity; Novolog]-   stable.  # recurrent UTI/ ? Suprapubic tenderness- S/p eval with urology.  Monitor closely on Abema/with neutropenia- s/p evaluation with Consuello Masse re: vaginal atrophy today-  stable.  # intermittent Hypercalcemia: Ca 10.4- monitor for now. FEB Vit D - 90; HOLD off calcium and Vit D-   stable.  # Low B 12 [PCP]- on B12 PO.  stable.  # Flu shot today- 06/02/23.   # IV access; PIV.  Mychart- # DISPOSITION:AM appt- NOT fridays # Follow-up in 1 month- -MD-  labs CBC CMP; magnesium; CA 27-29;- Dr. Leonard Schwartz

## 2023-10-21 NOTE — Patient Instructions (Signed)
Carvedilol but recommend taking 1/2 pill BID-

## 2023-10-21 NOTE — Progress Notes (Signed)
[name] OFFICE PROGRESS NOTE  Patient Care Team: Luciana Axe, NP as PCP - General (Family Medicine) Cyndia Bent, MD (Inactive) (General Surgery) Harold Hedge, MD (Obstetrics and Gynecology) Carmina Miller, MD Johney Maine, MD (Unknown Physician Specialty) Earna Coder, MD as Consulting Physician (Hematology and Oncology)   Cancer Staging  Carcinoma of lower-outer quadrant of right breast in female, estrogen receptor positive [facility]) Staging form: Breast, AJCC 7th Edition - Pathologic: ypT1c,ypN2a, MX - Signed by Currie Paris, MD on 03/10/2012 Cancer stage: ypT1c,ypN2a, MX    Oncology History Overview Note  # DEC 2012- RIGHT BREAST CA T2 N2 M0 tumor from biopsy.  Estrogen receptor positive, Progesterone receptor positive.  Current receptor negative by FISH 2. Neoadjuvant chemotherapy started in December of 28 with Cytoxan Adriamycin 3. Started on Taxol weekly chemotherapy. 4. Patient finished 12  cycles of Taxol chemotherapy in May of 2013.     5. Status post right modified radical mastectomy [Dr.Bowers; [place]] June of 2013, ypT1c  yp N2  MO. started also on Lerazole    7. Radiation therapy to the right breast (September of 2013).  Lymph node was positive for HER-2 receptor gene amplification of 2.22.  Will proceed with Herceptin treatment starting in September of 2013.   8.Patient has finished Herceptin (maintenance therapy) in August of 2014 8. Start patient on letrozole from November, 2013. 9. Patient started on Herceptin in September 2013.    10Patient finished 12 months of Herceptin therapy on August, 2014  # 6. Status post left side prophylactic mastectomy.  #Late MAY 2018-RECURRENCE BREAST CA- ER positive/PR negative; ?? HER-2/neu- [biopsy- proven-mediastinal lymph node; in suff for her 2 testing].  [elevated Tumor marker- CT/PET- uptake in Right Mediastinal LN; Sternum [June 2018 EBUS- Dr.Kasa]   # March 10 2017- faslodex +  Abema; OCT 5th CT-PR of mediastinal LN [consent]; Discontinued faslodex sec to insurance issues [last DEC 2023]. JAN 2024- continue Abema + Anastrzole   #August 2020-left chest wall nodule biopsy benign  #August 2019- DVT-right upper extremity; Eliquis; stop end of March 2021 [repeat ultrasound negative/patient preference]; # JAN 2022-Cirrhosis- ? On CT scan-FEB liver- Negative fro overt cirrhosis --------------------------------------------------------------- -    DIAGNOSIS: [ BREAST CANCER- ER/PR/HER2 NEU POS  STAGE:  4   ;GOALS: PALLIATIVE     Carcinoma of lower-outer quadrant of right breast in female, estrogen receptor positive (HCC)   INTERVAL HISTORY: Alone.  Ambulating independently.  Kimberly [name] 68 y.o.  female pleasant patient above history of metastatic ER PR positive HER-2/neu positive breast cancer currently on abema+ Anastrazole  is here for follow-up/and review the results of the CT scan.  Patient continues to complain of ongoing allergy symptoms. Eyes watering x6 weeks and drainage in back of throat. Currently on anti-histamine/ singulair.    No fever.Taking vezenio; no side effects. No hot flashes from arimidex. Denies achiness. Has chronic knee pain right. Appetite is good. Fatigued.   Review of Systems  Constitutional:  Positive for malaise/fatigue. Negative for chills, diaphoresis, fever and weight loss.  HENT:  Negative for nosebleeds and sore throat.   Eyes:  Negative for double vision.  Respiratory:  Negative for cough, hemoptysis, sputum production, shortness of breath and wheezing.   Cardiovascular:  Negative for chest pain, palpitations, orthopnea and leg swelling.  Gastrointestinal:  Negative for abdominal pain, blood in stool, constipation, heartburn, melena, nausea and vomiting.  Musculoskeletal:  Positive for back pain and joint pain.  Neurological:  Negative for dizziness, tingling,  focal weakness, weakness and headaches.   Endo/Heme/Allergies:  Does not bruise/bleed easily.  Psychiatric/Behavioral:  Negative for depression. The patient is not nervous/anxious and does not have insomnia.       PAST MEDICAL HISTORY :  Past Medical History:  Diagnosis Date   Arthritis    Cancer of lower-outer quadrant of female breast (HCC) 08/06/2011   RIGHT, chemo and bilateral mastectomies.   High cholesterol    History of kidney stones    Hx of bilateral breast implants    Hypertension    Lung metastases 20118   chemo pills and faslidex shots.   PONV (postoperative nausea and vomiting)    Type II diabetes mellitus (HCC)    fasting 140-150   Vertigo    LAST WEEK   Vertigo 01/2017    PAST SURGICAL HISTORY :   Past Surgical History:  Procedure Laterality Date   BREAST BIOPSY  07/2011, 2020   right   BREAST RECONSTRUCTION  03/07/2012   Procedure: BREAST RECONSTRUCTION;  Surgeon: Etter Sjogren, MD;  Location[facility];  Service: Plastics;  Laterality: Left;  BREAST RECONSTRUCTION WITH PLACEMENT OF TISSUE EXPANDER TO LEFT BREAST   BREAST RECONSTRUCTION  02/06/2013   CESAREAN SECTION  [id number]   ENDOBRONCHIAL ULTRASOUND N/A 02/24/2017   Procedure: ENDOBRONCHIAL ULTRASOUND;  Surgeon: Erin Fulling, MD;  Location: [facility] ORS;  Service: Cardiopulmonary;  Laterality: N/A;   LATISSIMUS FLAP TO BREAST Right 02/06/2013   Procedure: LATISSIMUS FLAP TO RIGHT BREAST WITH IMPLANT;  Surgeon: Etter Sjogren, MD;  Location: [facility] OR;  Service: Plastics;  Laterality: Right;   MASTECTOMY  03/07/12   modified right; total left   MODIFIED MASTECTOMY  03/07/2012   Procedure: MODIFIED MASTECTOMY;  Surgeon: Currie Paris, MD;  Location: [facility] OR;  Service: General;  Laterality: Right;   PORTACATH PLACEMENT  07/2011   SCAR REVISION  03/30/2012   Procedure: SCAR REVISION;  Surgeon: Currie Paris, MD;  Location: [facility];  Service: General;  Laterality: Right;  CLOSURE OF MASTECTOMY INCISION   WISDOM TOOTH EXTRACTION      FAMILY  HISTORY :   Family History  Problem Relation Age of Onset   Breast cancer Mother    Diabetes Father     SOCIAL HISTORY:   Social History   Tobacco Use   Smoking status: Never   Smokeless tobacco: Never  Vaping Use   Vaping status: Never Used  Substance Use Topics   Alcohol use: No   Drug use: No    ALLERGIES:  is allergic to sulfa antibiotics.  MEDICATIONS:  Current Outpatient Medications  Medication Sig Dispense Refill   abemaciclib (VERZENIO) 100 MG tablet Take 1 tablet (100 mg total) by mouth 2 (two) times daily. 56 tablet 1   albuterol (VENTOLIN HFA) 108 (90 Base) MCG/ACT inhaler Inhale 1-2 puffs into the lungs every 6 (six) hours as needed for wheezing or shortness of breath. 8 g 2   anastrozole (ARIMIDEX) 1 MG tablet TAKE 1 TABLET BY MOUTH EVERY DAY 90 tablet 2   atorvastatin (LIPITOR) 20 MG tablet Take 1 tablet (20 mg total) by mouth daily. 90 tablet 3   carvedilol (COREG) 6.25 MG tablet Take 1 tablet (6.25 mg total) by mouth 2 (two) times daily with a meal. 60 tablet 3   Cholecalciferol 50 MCG (2000 UT) CAPS Take 3 capsules daily for 3 months, then reduce to 1 capsule daily thereafter for Vitamin D Deficiency.     clotrimazole-betamethasone (LOTRISONE) cream Apply topically daily  as needed. 30 g 0   Cranberry-Vitamin C-Probiotic (AZO CRANBERRY PO) Take 1 Capful by mouth daily at 12 noon.     cyanocobalamin (VITAMIN B12) 1000 MCG tablet 1,000 mcg daily. Take 2 tablets daily for 2 weeks, then reduce to 1 tablet daily thereafter for Vitamin B12 Deficiency.     glipiZIDE (GLUCOTROL XL) 10 MG 24 hr tablet Take 1 tablet (10 mg total) by mouth 2 (two) times daily. 180 tablet 3   insulin aspart (NOVOLOG FLEXPEN) 100 UNIT/ML FlexPen Inject 4 Units into the skin 3 (three) times daily with meals. 15 mL 0   Insulin Detemir (LEVEMIR) 100 UNIT/ML Pen Inject 20 Units into the skin at bedtime. 15 mL 0   levothyroxine (SYNTHROID) 25 MCG tablet Take 1 tablet (25 mcg total) by mouth daily  before breakfast. 90 tablet 1   loperamide (IMODIUM A-D) 2 MG tablet Take 2 mg by mouth 4 (four) times daily as needed for diarrhea or loose stools. (Patient not taking: Reported on 10/21/2023)     magnesium oxide (MAG-OX) 400 MG tablet Take 1 tablet by mouth daily.     Melatonin 10 MG TABS Take 1 tablet by mouth at bedtime as needed.     montelukast (SINGULAIR) 10 MG tablet Take 1 tablet (10 mg total) by mouth at bedtime. 30 tablet 3   ondansetron (ZOFRAN-ODT) 4 MG disintegrating tablet Take 1 tablet (4 mg total) by mouth every 8 (eight) hours as needed for nausea or vomiting. (Patient not taking: Reported on 10/21/2023) 30 tablet 3   potassium chloride (KLOR-CON M) 10 MEQ tablet Take 1 tablet (10 mEq total) by mouth daily. 7 tablet 0   promethazine (PHENERGAN) 25 MG tablet Take 1 tablet (25 mg total) by mouth every 8 (eight) hours as needed for nausea or vomiting. (Patient not taking: Reported on 10/21/2023) 15 tablet 0   No current facility-administered medications for this visit.    PHYSICAL EXAMINATION: ECOG PERFORMANCE STATUS: 1 - Symptomatic but completely ambulatory  BP (!) 72/44 (BP Location: Left Arm, Patient Position: Sitting, Cuff Size: Normal)   Pulse 79   Temp (!) 96.9 F (36.1 C) (Tympanic)   Ht 5' 2.5" (1.588 m)   Wt 154 lb 6.4 oz (70 kg)   SpO2 100%   BMI 27.79 kg/m   Filed Weights   10/21/23 1004  Weight: 154 lb 6.4 oz (70 kg)      Physical Exam Constitutional:      Comments: She is alone.   HENT:     Head: Normocephalic and atraumatic.     Mouth/Throat:     Pharynx: No oropharyngeal exudate.  Eyes:     Pupils: Pupils are equal, round, and reactive to light.  Cardiovascular:     Rate and Rhythm: Normal rate and regular rhythm.  Pulmonary:     Effort: Pulmonary effort is normal. No respiratory distress.     Breath sounds: Normal breath sounds. No wheezing.  Abdominal:     General: Bowel sounds are normal. There is no distension.     Palpations: Abdomen is  soft. There is no mass.     Tenderness: There is no abdominal tenderness. There is no guarding or rebound.  Musculoskeletal:        General: No tenderness. Normal range of motion.     Cervical back: Normal range of motion and neck supple.     Comments: Approximately 1 cm hard nodule felt in the anterior chest wall/left parasternal.  Skin:  General: Skin is warm.  Neurological:     Mental Status: She is alert and oriented to person, place, and time.  Psychiatric:        Mood and Affect: Affect normal.    LABORATORY DATA:  I have reviewed the data as listed    Component Value Date/Time   NA 138 10/21/2023 1008   NA 135 11/28/2014 1401   K 3.2 (L) 10/21/2023 1008   K 4.7 11/28/2014 1401   CL 101 10/21/2023 1008   CL 99 (L) 11/28/2014 1401   CO2 26 10/21/2023 1008   CO2 28 11/28/2014 1401   GLUCOSE 101 (H) 10/21/2023 1008   GLUCOSE 215 (H) 11/28/2014 1401   BUN 14 10/21/2023 1008   BUN 16 11/28/2014 1401   CREATININE 0.96 10/21/2023 1008   CREATININE 0.96 11/28/2014 1401   CALCIUM 10.2 10/21/2023 1008   CALCIUM 9.9 11/28/2014 1401   PROT 6.4 (L) 10/21/2023 1008   PROT 7.6 11/28/2014 1401   ALBUMIN 3.4 (L) 10/21/2023 1008   ALBUMIN 4.5 11/28/2014 1401   AST 32 10/21/2023 1008   ALT 20 10/21/2023 1008   ALT 28 11/28/2014 1401   ALKPHOS 62 10/21/2023 1008   ALKPHOS 78 11/28/2014 1401   BILITOT 0.7 10/21/2023 1008   GFRNONAA >60 10/21/2023 1008   GFRNONAA >60 11/28/2014 1401   GFRAA >60 05/30/2020 1022   GFRAA >60 11/28/2014 1401    No results found for: "SPEP", "UPEP"  Lab Results  Component Value Date   WBC 2.4 (L) 10/21/2023   NEUTROABS 1.1 (L) 10/21/2023   HGB 10.2 (L) 10/21/2023   HCT 30.0 (L) 10/21/2023   MCV 100.3 (H) 10/21/2023   PLT 188 10/21/2023      Chemistry      Component Value Date/Time   NA 138 10/21/2023 1008   NA 135 11/28/2014 1401   K 3.2 (L) 10/21/2023 1008   K 4.7 11/28/2014 1401   CL 101 10/21/2023 1008   CL 99 (L) 11/28/2014 1401    CO2 26 10/21/2023 1008   CO2 28 11/28/2014 1401   BUN 14 10/21/2023 1008   BUN 16 11/28/2014 1401   CREATININE 0.96 10/21/2023 1008   CREATININE 0.96 11/28/2014 1401      Component Value Date/Time   CALCIUM 10.2 10/21/2023 1008   CALCIUM 9.9 11/28/2014 1401   ALKPHOS 62 10/21/2023 1008   ALKPHOS 78 11/28/2014 1401   AST 32 10/21/2023 1008   ALT 20 10/21/2023 1008   ALT 28 11/28/2014 1401   BILITOT 0.7 10/21/2023 1008       RADIOGRAPHIC STUDIES: I have personally reviewed the radiological images as listed and agreed with the findings in the report. No results found.   ASSESSMENT & PLAN:  Carcinoma of lower-outer quadrant of right breast in female, estrogen receptor positive (HCC)  # Recurrent breast cancer-ER positive PR negative ? HER-2/neu-currently on abema+ Anastrozole. JULY 2024- CT New small nodule of the superior segment right lower lobe measuring 0.7 cm, possibly infectious or inflammatory but suspicious for a pulmonary metastasis.  No evidence of lymphadenopathy or metastatic disease in the abdomen or pelvis. stable.  # Continue Anastrazole [JAN Mid 2024-] for now. Continue with Abema [100 BID; off weekends] Labs today-ANC- 1.1   Hb-9-10; platelets-N.  # July 2024 CT scan right upper lobe lung nodule-CT chest scan November 11th, 2024- interval resolution of the small irregular right upper lobe nodule seen on the most recent study. No new or enlarging pulmonary nodules identified. Stable  chronic radiation changes anteriorly at the right lung apex.  Awaiting re-imaging results from FEB 2025.   # Hypotension: SEP 70-80s-systolic- [last 2  month]. NO significant symptoms at this time. STOPPED lisinopril;- OK to re-start Carvedilol but recommend taking 1/2 pill BID-  # CKD stage II- stable.   # Seasonal allergies-continue OTC antihistamines.  Added Singulair- not improved- discussed re: ENt referral; pt will talkto her PCP in [facility].   # Hypomagnesia: 1.4- on OTC mag-   stable.  # DM- BG- 101 s/p endocrinology evaluation; with diet and medications [trucilicity; Novolog]-   stable.  # recurrent UTI/ ? Suprapubic tenderness- S/p eval with urology.  Monitor closely on Abema/with neutropenia- s/p evaluation with Consuello Masse re: vaginal atrophy today-  stable.  # intermittent Hypercalcemia: Ca 10.4- monitor for now. FEB Vit D - 90; HOLD off calcium and Vit D-   stable.  # Low B 12 [PCP]- on B12 PO.  stable.  # Flu shot today- 06/02/23.   # IV access; PIV.  Mychart- # DISPOSITION:AM appt- NOT fridays # Follow-up in 1 month- -MD-  labs CBC CMP; magnesium; CA 27-29;- Dr. Leonard Schwartz      Orders Placed This Encounter  Procedures   CBC with Differential (Cancer Center Only)    Standing Status:   Future    Expected Date:   11/18/2023    Expiration Date:   10/20/2024   CMP (Cancer Center only)    Standing Status:   Future    Expected Date:   11/18/2023    Expiration Date:   10/20/2024   Magnesium    Standing Status:   Future    Expected Date:   11/18/2023    Expiration Date:   10/20/2024   Cancer antigen 27.29    Standing Status:   Future    Expected Date:   11/18/2023    Expiration Date:   10/20/2024   All questions were answered. The patient knows to call the clinic with any problems, questions or concerns.      Earna Coder, MD 10/21/2023 11:24 AM

## 2023-10-22 LAB — CANCER ANTIGEN 27.29: CA 27.29: 29.8 U/mL (ref 0.0–38.6)

## 2023-10-25 ENCOUNTER — Encounter: Payer: Self-pay | Admitting: Internal Medicine

## 2023-10-27 ENCOUNTER — Other Ambulatory Visit: Payer: Self-pay

## 2023-10-27 ENCOUNTER — Other Ambulatory Visit (HOSPITAL_COMMUNITY): Payer: Self-pay

## 2023-10-27 ENCOUNTER — Other Ambulatory Visit: Payer: Self-pay | Admitting: Internal Medicine

## 2023-10-27 DIAGNOSIS — C50511 Malignant neoplasm of lower-outer quadrant of right female breast: Secondary | ICD-10-CM

## 2023-10-27 MED ORDER — ABEMACICLIB 100 MG PO TABS
100.0000 mg | ORAL_TABLET | Freq: Two times a day (BID) | ORAL | 1 refills | Status: DC
Start: 2023-10-27 — End: 2023-12-21
  Filled 2023-10-27: qty 56, 28d supply, fill #0
  Filled 2023-11-22: qty 56, 28d supply, fill #1

## 2023-10-27 NOTE — Progress Notes (Signed)
 Specialty Pharmacy Refill Coordination Note  Kimberly [name] is a 68 y.o. female contacted today regarding refills of specialty medication(s) Abemaciclib [name])   Patient requested Delivery   Delivery date: 11/01/23   Verified address: [address]   [place] [id number]   Medication will be filled on 10/31/23.   This fill date is pending response to refill request from provider. Patient is aware and if they have not received fill by intended date, they must follow up with pharmacy.

## 2023-10-31 ENCOUNTER — Other Ambulatory Visit: Payer: Self-pay

## 2023-11-21 ENCOUNTER — Inpatient Hospital Stay: Payer: PPO | Attending: Internal Medicine

## 2023-11-21 ENCOUNTER — Encounter: Payer: Self-pay | Admitting: Internal Medicine

## 2023-11-21 ENCOUNTER — Inpatient Hospital Stay: Payer: PPO | Admitting: Internal Medicine

## 2023-11-21 VITALS — BP 123/72 | HR 83 | Temp 97.8°F | Resp 17 | Wt 157.0 lb

## 2023-11-21 DIAGNOSIS — I129 Hypertensive chronic kidney disease with stage 1 through stage 4 chronic kidney disease, or unspecified chronic kidney disease: Secondary | ICD-10-CM | POA: Diagnosis not present

## 2023-11-21 DIAGNOSIS — Z9013 Acquired absence of bilateral breasts and nipples: Secondary | ICD-10-CM | POA: Insufficient documentation

## 2023-11-21 DIAGNOSIS — Z79899 Other long term (current) drug therapy: Secondary | ICD-10-CM | POA: Diagnosis not present

## 2023-11-21 DIAGNOSIS — Z170 Estrogen receptor positive status [ER+]: Secondary | ICD-10-CM

## 2023-11-21 DIAGNOSIS — Z1721 Progesterone receptor positive status: Secondary | ICD-10-CM | POA: Insufficient documentation

## 2023-11-21 DIAGNOSIS — Z79811 Long term (current) use of aromatase inhibitors: Secondary | ICD-10-CM | POA: Insufficient documentation

## 2023-11-21 DIAGNOSIS — Z7984 Long term (current) use of oral hypoglycemic drugs: Secondary | ICD-10-CM | POA: Diagnosis not present

## 2023-11-21 DIAGNOSIS — C50511 Malignant neoplasm of lower-outer quadrant of right female breast: Secondary | ICD-10-CM

## 2023-11-21 DIAGNOSIS — E1122 Type 2 diabetes mellitus with diabetic chronic kidney disease: Secondary | ICD-10-CM | POA: Diagnosis not present

## 2023-11-21 DIAGNOSIS — Z1731 Human epidermal growth factor receptor 2 positive status: Secondary | ICD-10-CM | POA: Insufficient documentation

## 2023-11-21 DIAGNOSIS — N182 Chronic kidney disease, stage 2 (mild): Secondary | ICD-10-CM | POA: Diagnosis not present

## 2023-11-21 DIAGNOSIS — C7800 Secondary malignant neoplasm of unspecified lung: Secondary | ICD-10-CM | POA: Diagnosis not present

## 2023-11-21 DIAGNOSIS — Z794 Long term (current) use of insulin: Secondary | ICD-10-CM | POA: Diagnosis not present

## 2023-11-21 DIAGNOSIS — Z86718 Personal history of other venous thrombosis and embolism: Secondary | ICD-10-CM | POA: Insufficient documentation

## 2023-11-21 LAB — CBC WITH DIFFERENTIAL (CANCER CENTER ONLY)
Abs Immature Granulocytes: 0.01 10*3/uL (ref 0.00–0.07)
Basophils Absolute: 0.1 10*3/uL (ref 0.0–0.1)
Basophils Relative: 2 %
Eosinophils Absolute: 0.1 10*3/uL (ref 0.0–0.5)
Eosinophils Relative: 4 %
HCT: 28.3 % — ABNORMAL LOW (ref 36.0–46.0)
Hemoglobin: 9.9 g/dL — ABNORMAL LOW (ref 12.0–15.0)
Immature Granulocytes: 1 %
Lymphocytes Relative: 34 %
Lymphs Abs: 0.8 10*3/uL (ref 0.7–4.0)
MCH: 35.1 pg — ABNORMAL HIGH (ref 26.0–34.0)
MCHC: 35 g/dL (ref 30.0–36.0)
MCV: 100.4 fL — ABNORMAL HIGH (ref 80.0–100.0)
Monocytes Absolute: 0.2 10*3/uL (ref 0.1–1.0)
Monocytes Relative: 7 %
Neutro Abs: 1.2 10*3/uL — ABNORMAL LOW (ref 1.7–7.7)
Neutrophils Relative %: 52 %
Platelet Count: 192 10*3/uL (ref 150–400)
RBC: 2.82 MIL/uL — ABNORMAL LOW (ref 3.87–5.11)
RDW: 12.8 % (ref 11.5–15.5)
WBC Count: 2.2 10*3/uL — ABNORMAL LOW (ref 4.0–10.5)
nRBC: 0 % (ref 0.0–0.2)

## 2023-11-21 LAB — CMP (CANCER CENTER ONLY)
ALT: 22 U/L (ref 0–44)
AST: 30 U/L (ref 15–41)
Albumin: 3.4 g/dL — ABNORMAL LOW (ref 3.5–5.0)
Alkaline Phosphatase: 69 U/L (ref 38–126)
Anion gap: 8 (ref 5–15)
BUN: 15 mg/dL (ref 8–23)
CO2: 24 mmol/L (ref 22–32)
Calcium: 9.7 mg/dL (ref 8.9–10.3)
Chloride: 100 mmol/L (ref 98–111)
Creatinine: 0.99 mg/dL (ref 0.44–1.00)
GFR, Estimated: >60 mL/min (ref >60)
Glucose, Bld: 176 mg/dL — ABNORMAL HIGH (ref 70–99)
Potassium: 3.6 mmol/L (ref 3.5–5.1)
Sodium: 132 mmol/L — ABNORMAL LOW (ref 135–145)
Total Bilirubin: 0.6 mg/dL (ref 0.0–1.2)
Total Protein: 7 g/dL (ref 6.5–8.1)

## 2023-11-21 LAB — MAGNESIUM: Magnesium: 1.3 mg/dL — ABNORMAL LOW (ref 1.7–2.4)

## 2023-11-21 NOTE — Assessment & Plan Note (Addendum)
#   Recurrent breast cancer-ER positive PR negative ? HER-2/neu-currently on abema+ Anastrozole. FEB 2025- Stable postsurgical changes from breast reconstruction and right axillary dissection. Stable areas of scarring and fibrotic changes seen of the lungs. No developing new mass lesion, fluid collection or nodal enlargement.   # Continue Anastrazole [JAN Mid 2024-] for now. Continue with Abema [100 BID; off weekends] Labs today-ANC- 1.2   Hb-9-10; platelets-N.  # Hypotension: SEP 70-80s-systolic- [last 3  month]. NO significant symptoms at this time. STOPPED lisinopril;- OK to re-start Carvedilol but recommend taking 1/2 pill BID- improved- stable.   # CKD stage II- stable.   # Seasonal allergies-continue OTC antihistamines.  Added Singulair- not improved- discussed re: ENt referral; pt will talkto her PCP in [facility]- stable. .   # Hypomagnesia: 1.4- on OTC mag-  stable.  # DM- BG- 101 s/p endocrinology evaluation; with diet and medications [trucilicity; Novolog]-   stable.  # recurrent UTI/ ? Suprapubic tenderness- S/p eval with urology.  Monitor closely on Abema/with neutropenia- s/p evaluation with Consuello Masse re: vaginal atrophy today-   stable.  # intermittent Hypercalcemia: Ca 10.4- monitor for now. FEB Vit D - 90; HOLD off calcium and Vit D-   stable.  # Low B 12 [PCP]- on B12 PO.  stable.  # #Incidental findings on Imaging  CT- FEB 2025--Fatty liver infiltration. Colonic diverticula. Non-obstructing left-sided renal stone. Gallstones.I reviewed/discussed/counseled the patient.   # Flu shot today- 06/02/23.   # IV access; PIV.  Mychart- # DISPOSITION:AM appt- NOT fridays # Follow-up in 1 month- -MD-  labs CBC CMP; magnesium; CA 27-29;iron studies;ferritin-; vit D 25-OH Dr. B  # I reviewed the blood work- with the patient in detail; also reviewed the imaging independently [as summarized above]; and with the patient in detail.

## 2023-11-21 NOTE — Progress Notes (Signed)
 [name] OFFICE PROGRESS NOTE  Patient Care Team: Kimberly Axe, NP as PCP - General (Family Medicine) Kimberly Bent, MD (Inactive) (General Surgery) Kimberly Hedge, MD (Obstetrics and Gynecology) Kimberly Miller, MD Kimberly Maine, MD (Unknown Physician Specialty) Kimberly Coder, MD as Consulting Physician (Hematology and Oncology)   Cancer Staging  Carcinoma of lower-outer quadrant of right breast in female, estrogen receptor positive [facility]) Staging form: Breast, AJCC 7th Edition - Pathologic: ypT1c,ypN2a, MX - Signed by Kimberly Paris, MD on 03/10/2012 Cancer stage: ypT1c,ypN2a, MX    Oncology History Overview Note  # DEC 2012- RIGHT BREAST CA T2 N2 M0 tumor from biopsy.  Estrogen receptor positive, Progesterone receptor positive.  Current receptor negative by FISH 2. Neoadjuvant chemotherapy started in December of 28 with Cytoxan Adriamycin 3. Started on Taxol weekly chemotherapy. 4. Patient finished 12  cycles of Taxol chemotherapy in May of 2013.     5. Status post right modified radical mastectomy [Kimberly [name]; [place]] June of 2013, ypT1c  yp N2  MO. started also on Lerazole    7. Radiation therapy to the right breast (September of 2013).  Lymph node was positive for HER-2 receptor gene amplification of 2.22.  Will proceed with Herceptin treatment starting in September of 2013.   8.Patient has finished Herceptin (maintenance therapy) in August of 2014 8. Start patient on letrozole from November, 2013. 9. Patient started on Herceptin in September 2013.    10Patient finished 12 months of Herceptin therapy on August, 2014  # 6. Status post left side prophylactic mastectomy.  #Late MAY 2018-RECURRENCE BREAST CA- ER positive/PR negative; ?? HER-2/neu- [biopsy- proven-mediastinal lymph node; in suff for her 2 testing].  [elevated Tumor marker- CT/PET- uptake in Right Mediastinal LN; Sternum [June 2018 [facility]- Kimberly [name]]   # March 10 2017- faslodex +  Abema; OCT 5th CT-PR of mediastinal LN [consent]; Discontinued faslodex sec to insurance issues [last DEC 2023]. JAN 2024- continue Abema + Anastrzole   #August 2020-left chest wall nodule biopsy benign  #August 2019- DVT-right upper extremity; Eliquis; stop end of March 2021 [repeat ultrasound negative/patient preference]; # JAN 2022-Cirrhosis- ? On CT scan-FEB liver- Negative fro overt cirrhosis --------------------------------------------------------------- -    DIAGNOSIS: [ BREAST CANCER- ER/PR/HER2 NEU POS  STAGE:  4   ;GOALS: PALLIATIVE     Carcinoma of lower-outer quadrant of right breast in female, estrogen receptor positive (HCC)   INTERVAL HISTORY: Alone.  Ambulating independently.  Kimberly [name] 68 y.o.  female pleasant patient above history of metastatic ER PR positive HER-2/neu positive breast cancer currently on abema+ Anastrazole  is here for follow-up.  Doing well. No complaints today. Gained 3 lbs since last visit. Taking all oral medications as prescribed. No major side effects.   Patient continues to complain of ongoing allergy symptoms- chronic- not any worse. No fever.Taking vezenio; no side effects. No hot flashes from arimidex. Denies achiness. Has chronic knee pain right. Appetite is good. Fatigued.   Review of Systems  Constitutional:  Positive for malaise/fatigue. Negative for chills, diaphoresis, fever and weight loss.  HENT:  Negative for nosebleeds and sore throat.   Eyes:  Negative for double vision.  Respiratory:  Negative for cough, hemoptysis, sputum production, shortness of breath and wheezing.   Cardiovascular:  Negative for chest pain, palpitations, orthopnea and leg swelling.  Gastrointestinal:  Negative for abdominal pain, blood in stool, constipation, heartburn, melena, nausea and vomiting.  Musculoskeletal:  Positive for back pain and joint pain.  Neurological:  Negative  for dizziness, tingling, focal weakness, weakness and  headaches.  Endo/Heme/Allergies:  Does not bruise/bleed easily.  Psychiatric/Behavioral:  Negative for depression. The patient is not nervous/anxious and does not have insomnia.       PAST MEDICAL HISTORY :  Past Medical History:  Diagnosis Date   Arthritis    Cancer of lower-outer quadrant of female breast (HCC) 08/06/2011   RIGHT, chemo and bilateral mastectomies.   High cholesterol    History of kidney stones    Hx of bilateral breast implants    Hypertension    Lung metastases 20118   chemo pills and faslidex shots.   PONV (postoperative nausea and vomiting)    Type II diabetes mellitus (HCC)    fasting 140-150   Vertigo    LAST WEEK   Vertigo 01/2017    PAST SURGICAL HISTORY :   Past Surgical History:  Procedure Laterality Date   BREAST BIOPSY  07/2011, 2020   right   BREAST RECONSTRUCTION  03/07/2012   Procedure: BREAST RECONSTRUCTION;  Surgeon: Kimberly Sjogren, MD;  Location[facility] OR;  Service: Plastics;  Laterality: Left;  BREAST RECONSTRUCTION WITH PLACEMENT OF TISSUE EXPANDER TO LEFT BREAST   BREAST RECONSTRUCTION  02/06/2013   CESAREAN SECTION  [id number]   ENDOBRONCHIAL ULTRASOUND N/A 02/24/2017   Procedure: ENDOBRONCHIAL ULTRASOUND;  Surgeon: Kimberly Fulling, MD;  Location: [facility] ORS;  Service: Cardiopulmonary;  Laterality: N/A;   LATISSIMUS FLAP TO BREAST Right 02/06/2013   Procedure: LATISSIMUS FLAP TO RIGHT BREAST WITH IMPLANT;  Surgeon: Kimberly Sjogren, MD;  Location: [facility] OR;  Service: Plastics;  Laterality: Right;   MASTECTOMY  03/07/12   modified right; total left   MODIFIED MASTECTOMY  03/07/2012   Procedure: MODIFIED MASTECTOMY;  Surgeon: Kimberly Paris, MD;  Location: [facility] OR;  Service: General;  Laterality: Right;   PORTACATH PLACEMENT  07/2011   SCAR REVISION  03/30/2012   Procedure: SCAR REVISION;  Surgeon: Kimberly Paris, MD;  Location: [facility];  Service: General;  Laterality: Right;  CLOSURE OF MASTECTOMY INCISION   WISDOM TOOTH EXTRACTION       FAMILY HISTORY :   Family History  Problem Relation Age of Onset   Breast cancer Mother    Diabetes Father     SOCIAL HISTORY:   Social History   Tobacco Use   Smoking status: Never   Smokeless tobacco: Never  Vaping Use   Vaping status: Never Used  Substance Use Topics   Alcohol use: No   Drug use: No    ALLERGIES:  is allergic to sulfa antibiotics.  MEDICATIONS:  Current Outpatient Medications  Medication Sig Dispense Refill   abemaciclib (VERZENIO) 100 MG tablet Take 1 tablet (100 mg total) by mouth 2 (two) times daily. 56 tablet 1   albuterol (VENTOLIN HFA) 108 (90 Base) MCG/ACT inhaler Inhale 1-2 puffs into the lungs every 6 (six) hours as needed for wheezing or shortness of breath. 8 g 2   anastrozole (ARIMIDEX) 1 MG tablet TAKE 1 TABLET BY MOUTH EVERY DAY 90 tablet 2   atorvastatin (LIPITOR) 20 MG tablet Take 1 tablet (20 mg total) by mouth daily. 90 tablet 3   carvedilol (COREG) 6.25 MG tablet Take 1 tablet (6.25 mg total) by mouth 2 (two) times daily with a meal. 60 tablet 3   Cholecalciferol 50 MCG (2000 UT) CAPS Take 3 capsules daily for 3 months, then reduce to 1 capsule daily thereafter for Vitamin D Deficiency.     clotrimazole-betamethasone (LOTRISONE) cream  Apply topically daily as needed. 30 g 0   Cranberry-Vitamin C-Probiotic (AZO CRANBERRY PO) Take 1 Capful by mouth daily at 12 noon.     cyanocobalamin (VITAMIN B12) 1000 MCG tablet 1,000 mcg daily. Take 2 tablets daily for 2 weeks, then reduce to 1 tablet daily thereafter for Vitamin B12 Deficiency.     glipiZIDE (GLUCOTROL XL) 10 MG 24 hr tablet Take 1 tablet (10 mg total) by mouth 2 (two) times daily. 180 tablet 3   insulin aspart (NOVOLOG FLEXPEN) 100 UNIT/ML FlexPen Inject 4 Units into the skin 3 (three) times daily with meals. 15 mL 0   Insulin Detemir (LEVEMIR) 100 UNIT/ML Pen Inject 20 Units into the skin at bedtime. 15 mL 0   levothyroxine (SYNTHROID) 25 MCG tablet Take 1 tablet (25 mcg total) by  mouth daily before breakfast. 90 tablet 1   magnesium oxide (MAG-OX) 400 MG tablet Take 1 tablet by mouth daily.     Melatonin 10 MG TABS Take 1 tablet by mouth at bedtime as needed.     montelukast (SINGULAIR) 10 MG tablet Take 1 tablet (10 mg total) by mouth at bedtime. 30 tablet 3   potassium chloride (KLOR-CON M) 10 MEQ tablet Take 1 tablet (10 mEq total) by mouth daily. 7 tablet 0   loperamide (IMODIUM A-D) 2 MG tablet Take 2 mg by mouth 4 (four) times daily as needed for diarrhea or loose stools. (Patient not taking: Reported on 09/20/2023)     ondansetron (ZOFRAN-ODT) 4 MG disintegrating tablet Take 1 tablet (4 mg total) by mouth every 8 (eight) hours as needed for nausea or vomiting. (Patient not taking: Reported on 09/20/2023) 30 tablet 3   promethazine (PHENERGAN) 25 MG tablet Take 1 tablet (25 mg total) by mouth every 8 (eight) hours as needed for nausea or vomiting. (Patient not taking: Reported on 09/20/2023) 15 tablet 0   No current facility-administered medications for this visit.    PHYSICAL EXAMINATION: ECOG PERFORMANCE STATUS: 1 - Symptomatic but completely ambulatory  BP 123/72 (BP Location: Right Arm, Patient Position: Sitting)   Pulse 83   Temp 97.8 F (36.6 C) (Tympanic)   Resp 17   Wt 157 lb (71.2 kg)   SpO2 99%   BMI 28.26 kg/m   Filed Weights   11/21/23 1013  Weight: 157 lb (71.2 kg)      Physical Exam Constitutional:      Comments: She is alone.   HENT:     Head: Normocephalic and atraumatic.     Mouth/Throat:     Pharynx: No oropharyngeal exudate.  Eyes:     Pupils: Pupils are equal, round, and reactive to light.  Cardiovascular:     Rate and Rhythm: Normal rate and regular rhythm.  Pulmonary:     Effort: Pulmonary effort is normal. No respiratory distress.     Breath sounds: Normal breath sounds. No wheezing.  Abdominal:     General: Bowel sounds are normal. There is no distension.     Palpations: Abdomen is soft. There is no mass.      Tenderness: There is no abdominal tenderness. There is no guarding or rebound.  Musculoskeletal:        General: No tenderness. Normal range of motion.     Cervical back: Normal range of motion and neck supple.     Comments: Approximately 1 cm hard nodule felt in the anterior chest wall/left parasternal.  Skin:    General: Skin is warm.  Neurological:  Mental Status: She is alert and oriented to person, place, and time.  Psychiatric:        Mood and Affect: Affect normal.    LABORATORY DATA:  I have reviewed the data as listed    Component Value Date/Time   NA 132 (L) 11/21/2023 0946   NA 135 11/28/2014 1401   K 3.6 11/21/2023 0946   K 4.7 11/28/2014 1401   CL 100 11/21/2023 0946   CL 99 (L) 11/28/2014 1401   CO2 24 11/21/2023 0946   CO2 28 11/28/2014 1401   GLUCOSE 176 (H) 11/21/2023 0946   GLUCOSE 215 (H) 11/28/2014 1401   BUN 15 11/21/2023 0946   BUN 16 11/28/2014 1401   CREATININE 0.99 11/21/2023 0946   CREATININE 0.96 11/28/2014 1401   CALCIUM 9.7 11/21/2023 0946   CALCIUM 9.9 11/28/2014 1401   PROT 7.0 11/21/2023 0946   PROT 7.6 11/28/2014 1401   ALBUMIN 3.4 (L) 11/21/2023 0946   ALBUMIN 4.5 11/28/2014 1401   AST 30 11/21/2023 0946   ALT 22 11/21/2023 0946   ALT 28 11/28/2014 1401   ALKPHOS 69 11/21/2023 0946   ALKPHOS 78 11/28/2014 1401   BILITOT 0.6 11/21/2023 0946   GFRNONAA >60 11/21/2023 0946   GFRNONAA >60 11/28/2014 1401   GFRAA >60 05/30/2020 1022   GFRAA >60 11/28/2014 1401    No results found for: "SPEP", "UPEP"  Lab Results  Component Value Date   WBC 2.2 (L) 11/21/2023   NEUTROABS 1.2 (L) 11/21/2023   HGB 9.9 (L) 11/21/2023   HCT 28.3 (L) 11/21/2023   MCV 100.4 (H) 11/21/2023   PLT 192 11/21/2023      Chemistry      Component Value Date/Time   NA 132 (L) 11/21/2023 0946   NA 135 11/28/2014 1401   K 3.6 11/21/2023 0946   K 4.7 11/28/2014 1401   CL 100 11/21/2023 0946   CL 99 (L) 11/28/2014 1401   CO2 24 11/21/2023 0946   CO2  28 11/28/2014 1401   BUN 15 11/21/2023 0946   BUN 16 11/28/2014 1401   CREATININE 0.99 11/21/2023 0946   CREATININE 0.96 11/28/2014 1401      Component Value Date/Time   CALCIUM 9.7 11/21/2023 0946   CALCIUM 9.9 11/28/2014 1401   ALKPHOS 69 11/21/2023 0946   ALKPHOS 78 11/28/2014 1401   AST 30 11/21/2023 0946   ALT 22 11/21/2023 0946   ALT 28 11/28/2014 1401   BILITOT 0.6 11/21/2023 0946       RADIOGRAPHIC STUDIES: I have personally reviewed the radiological images as listed and agreed with the findings in the report. No results found.   ASSESSMENT & PLAN:  Carcinoma of lower-outer quadrant of right breast in female, estrogen receptor positive (HCC)  # Recurrent breast cancer-ER positive PR negative ? HER-2/neu-currently on abema+ Anastrozole. FEB 2025- Stable postsurgical changes from breast reconstruction and right axillary dissection. Stable areas of scarring and fibrotic changes seen of the lungs. No developing new mass lesion, fluid collection or nodal enlargement.   # Continue Anastrazole [JAN Mid 2024-] for now. Continue with Abema [100 BID; off weekends] Labs today-ANC- 1.2   Hb-9-10; platelets-N.  # Hypotension: SEP 70-80s-systolic- [last 3  month]. NO significant symptoms at this time. STOPPED lisinopril;- OK to re-start Carvedilol but recommend taking 1/2 pill BID- improved- stable.   # CKD stage II- stable.   # Seasonal allergies-continue OTC antihistamines.  Added Singulair- not improved- discussed re: ENt referral; pt will talkto [facility] PCP  in [facility]- stable. .   # Hypomagnesia: 1.4- on OTC mag-  stable.  # DM- BG- 101 s/p endocrinology evaluation; with diet and medications [trucilicity; Novolog]-   stable.  # recurrent UTI/ ? Suprapubic tenderness- S/p eval with urology.  Monitor closely on Abema/with neutropenia- s/p evaluation with Consuello Masse re: vaginal atrophy today-   stable.  # intermittent Hypercalcemia: Ca 10.4- monitor for now. FEB Vit D - 90; HOLD off  calcium and Vit D-   stable.  # Low B 12 [PCP]- on B12 PO.  stable.  # #Incidental findings on Imaging  CT- FEB 2025--Fatty liver infiltration. Colonic diverticula. Non-obstructing left-sided renal stone. Gallstones.I reviewed/discussed/counseled the patient.   # Flu shot today- 06/02/23.   # IV access; PIV.  Mychart- # DISPOSITION:AM appt- NOT fridays # Follow-up in 1 month- -MD-  labs CBC CMP; magnesium; CA 27-29;iron studies;ferritin-; vit D 25-OH Dr. B  # I reviewed the blood work- with the patient in detail; also reviewed the imaging independently [as summarized above]; and with the patient in detail.        Orders Placed This Encounter  Procedures   CBC with Differential (Cancer Center Only)    Standing Status:   Future    Expected Date:   12/22/2023    Expiration Date:   11/20/2024   CMP (Cancer Center only)    Standing Status:   Future    Expected Date:   12/22/2023    Expiration Date:   11/20/2024   Magnesium    Standing Status:   Future    Expected Date:   12/22/2023    Expiration Date:   11/20/2024   Cancer antigen 27.29    Standing Status:   Future    Expected Date:   12/22/2023    Expiration Date:   11/20/2024   Iron and TIBC    Standing Status:   Future    Expected Date:   12/22/2023    Expiration Date:   11/20/2024   Ferritin    Standing Status:   Future    Expected Date:   12/22/2023    Expiration Date:   11/20/2024   VITAMIN D 25 Hydroxy (Vit-D Deficiency, Fractures)    Standing Status:   Future    Expected Date:   12/22/2023    Expiration Date:   11/20/2024   All questions were answered. The patient knows to call the clinic with any problems, questions or concerns.      Kimberly Coder, MD 11/21/2023 10:55 AM

## 2023-11-21 NOTE — Progress Notes (Signed)
 Doing well. No complaints today. Gained 3 lbs since last visit. Taking all oral medications as prescribed. No major side effects.

## 2023-11-22 ENCOUNTER — Other Ambulatory Visit (HOSPITAL_COMMUNITY): Payer: Self-pay

## 2023-11-22 ENCOUNTER — Other Ambulatory Visit: Payer: Self-pay

## 2023-11-22 LAB — CANCER ANTIGEN 27.29: CA 27.29: 30.3 U/mL (ref 0.0–38.6)

## 2023-11-22 NOTE — Progress Notes (Signed)
 Specialty Pharmacy Refill Coordination Note  Kimberly [name] is a 68 y.o. female contacted today regarding refills of specialty medication(s) Abemaciclib [name])   Patient requested (Patient-Rptd) Delivery   Delivery date: (Patient-Rptd) 11/25/23   Verified address: (Patient-Rptd) [address] [place] [address]   Medication will be filled on 03.27.25.

## 2023-11-28 ENCOUNTER — Encounter: Payer: Self-pay | Admitting: Internal Medicine

## 2023-12-08 DIAGNOSIS — J302 Other seasonal allergic rhinitis: Secondary | ICD-10-CM | POA: Insufficient documentation

## 2023-12-08 DIAGNOSIS — E1169 Type 2 diabetes mellitus with other specified complication: Secondary | ICD-10-CM | POA: Diagnosis not present

## 2023-12-08 DIAGNOSIS — C50511 Malignant neoplasm of lower-outer quadrant of right female breast: Secondary | ICD-10-CM | POA: Diagnosis not present

## 2023-12-08 DIAGNOSIS — Z1329 Encounter for screening for other suspected endocrine disorder: Secondary | ICD-10-CM | POA: Diagnosis not present

## 2023-12-08 DIAGNOSIS — E538 Deficiency of other specified B group vitamins: Secondary | ICD-10-CM | POA: Diagnosis not present

## 2023-12-08 DIAGNOSIS — C50919 Malignant neoplasm of unspecified site of unspecified female breast: Secondary | ICD-10-CM | POA: Diagnosis not present

## 2023-12-08 DIAGNOSIS — M25561 Pain in right knee: Secondary | ICD-10-CM | POA: Diagnosis not present

## 2023-12-08 DIAGNOSIS — E782 Mixed hyperlipidemia: Secondary | ICD-10-CM | POA: Diagnosis not present

## 2023-12-08 DIAGNOSIS — Z0000 Encounter for general adult medical examination without abnormal findings: Secondary | ICD-10-CM | POA: Diagnosis not present

## 2023-12-08 DIAGNOSIS — N182 Chronic kidney disease, stage 2 (mild): Secondary | ICD-10-CM | POA: Diagnosis not present

## 2023-12-08 DIAGNOSIS — G620 Drug-induced polyneuropathy: Secondary | ICD-10-CM | POA: Diagnosis not present

## 2023-12-08 DIAGNOSIS — Z794 Long term (current) use of insulin: Secondary | ICD-10-CM | POA: Diagnosis not present

## 2023-12-08 DIAGNOSIS — E1159 Type 2 diabetes mellitus with other circulatory complications: Secondary | ICD-10-CM | POA: Diagnosis not present

## 2023-12-08 DIAGNOSIS — E1122 Type 2 diabetes mellitus with diabetic chronic kidney disease: Secondary | ICD-10-CM | POA: Diagnosis not present

## 2023-12-08 DIAGNOSIS — E039 Hypothyroidism, unspecified: Secondary | ICD-10-CM | POA: Diagnosis not present

## 2023-12-08 DIAGNOSIS — I700 Atherosclerosis of aorta: Secondary | ICD-10-CM | POA: Diagnosis not present

## 2023-12-08 DIAGNOSIS — Z1331 Encounter for screening for depression: Secondary | ICD-10-CM | POA: Diagnosis not present

## 2023-12-08 DIAGNOSIS — N1831 Chronic kidney disease, stage 3a: Secondary | ICD-10-CM | POA: Diagnosis not present

## 2023-12-12 DIAGNOSIS — M25561 Pain in right knee: Secondary | ICD-10-CM | POA: Diagnosis not present

## 2023-12-12 DIAGNOSIS — G8929 Other chronic pain: Secondary | ICD-10-CM | POA: Diagnosis not present

## 2023-12-13 ENCOUNTER — Other Ambulatory Visit: Payer: Self-pay | Admitting: Internal Medicine

## 2023-12-21 ENCOUNTER — Other Ambulatory Visit: Payer: Self-pay | Admitting: Pharmacy Technician

## 2023-12-21 ENCOUNTER — Other Ambulatory Visit: Payer: Self-pay | Admitting: Internal Medicine

## 2023-12-21 ENCOUNTER — Inpatient Hospital Stay: Admitting: Internal Medicine

## 2023-12-21 ENCOUNTER — Other Ambulatory Visit: Payer: Self-pay

## 2023-12-21 ENCOUNTER — Inpatient Hospital Stay: Attending: Internal Medicine

## 2023-12-21 ENCOUNTER — Encounter: Payer: Self-pay | Admitting: Internal Medicine

## 2023-12-21 VITALS — BP 128/69 | HR 88 | Temp 97.7°F | Resp 16 | Ht 62.5 in | Wt 154.2 lb

## 2023-12-21 DIAGNOSIS — Z170 Estrogen receptor positive status [ER+]: Secondary | ICD-10-CM

## 2023-12-21 DIAGNOSIS — Z1731 Human epidermal growth factor receptor 2 positive status: Secondary | ICD-10-CM | POA: Insufficient documentation

## 2023-12-21 DIAGNOSIS — C50511 Malignant neoplasm of lower-outer quadrant of right female breast: Secondary | ICD-10-CM

## 2023-12-21 DIAGNOSIS — E1122 Type 2 diabetes mellitus with diabetic chronic kidney disease: Secondary | ICD-10-CM | POA: Diagnosis not present

## 2023-12-21 DIAGNOSIS — Z794 Long term (current) use of insulin: Secondary | ICD-10-CM | POA: Insufficient documentation

## 2023-12-21 DIAGNOSIS — Z9013 Acquired absence of bilateral breasts and nipples: Secondary | ICD-10-CM | POA: Diagnosis not present

## 2023-12-21 DIAGNOSIS — Z1721 Progesterone receptor positive status: Secondary | ICD-10-CM | POA: Diagnosis not present

## 2023-12-21 DIAGNOSIS — I129 Hypertensive chronic kidney disease with stage 1 through stage 4 chronic kidney disease, or unspecified chronic kidney disease: Secondary | ICD-10-CM | POA: Insufficient documentation

## 2023-12-21 DIAGNOSIS — Z79899 Other long term (current) drug therapy: Secondary | ICD-10-CM | POA: Diagnosis not present

## 2023-12-21 DIAGNOSIS — Z79811 Long term (current) use of aromatase inhibitors: Secondary | ICD-10-CM | POA: Diagnosis not present

## 2023-12-21 DIAGNOSIS — Z7984 Long term (current) use of oral hypoglycemic drugs: Secondary | ICD-10-CM | POA: Diagnosis not present

## 2023-12-21 DIAGNOSIS — C7800 Secondary malignant neoplasm of unspecified lung: Secondary | ICD-10-CM | POA: Diagnosis not present

## 2023-12-21 DIAGNOSIS — N182 Chronic kidney disease, stage 2 (mild): Secondary | ICD-10-CM | POA: Insufficient documentation

## 2023-12-21 LAB — CBC WITH DIFFERENTIAL (CANCER CENTER ONLY)
Abs Immature Granulocytes: 0.02 10*3/uL (ref 0.00–0.07)
Basophils Absolute: 0.1 10*3/uL (ref 0.0–0.1)
Basophils Relative: 2 %
Eosinophils Absolute: 0.1 10*3/uL (ref 0.0–0.5)
Eosinophils Relative: 2 %
HCT: 30 % — ABNORMAL LOW (ref 36.0–46.0)
Hemoglobin: 10.3 g/dL — ABNORMAL LOW (ref 12.0–15.0)
Immature Granulocytes: 1 %
Lymphocytes Relative: 40 %
Lymphs Abs: 1.3 10*3/uL (ref 0.7–4.0)
MCH: 34.1 pg — ABNORMAL HIGH (ref 26.0–34.0)
MCHC: 34.3 g/dL (ref 30.0–36.0)
MCV: 99.3 fL (ref 80.0–100.0)
Monocytes Absolute: 0.2 10*3/uL (ref 0.1–1.0)
Monocytes Relative: 7 %
Neutro Abs: 1.6 10*3/uL — ABNORMAL LOW (ref 1.7–7.7)
Neutrophils Relative %: 48 %
Platelet Count: 204 10*3/uL (ref 150–400)
RBC: 3.02 MIL/uL — ABNORMAL LOW (ref 3.87–5.11)
RDW: 12.9 % (ref 11.5–15.5)
WBC Count: 3.2 10*3/uL — ABNORMAL LOW (ref 4.0–10.5)
nRBC: 0 % (ref 0.0–0.2)

## 2023-12-21 LAB — CMP (CANCER CENTER ONLY)
ALT: 28 U/L (ref 0–44)
AST: 33 U/L (ref 15–41)
Albumin: 3.5 g/dL (ref 3.5–5.0)
Alkaline Phosphatase: 79 U/L (ref 38–126)
Anion gap: 9 (ref 5–15)
BUN: 18 mg/dL (ref 8–23)
CO2: 25 mmol/L (ref 22–32)
Calcium: 9.9 mg/dL (ref 8.9–10.3)
Chloride: 99 mmol/L (ref 98–111)
Creatinine: 1.02 mg/dL — ABNORMAL HIGH (ref 0.44–1.00)
GFR, Estimated: 60 mL/min — ABNORMAL LOW (ref >60)
Glucose, Bld: 277 mg/dL — ABNORMAL HIGH (ref 70–99)
Potassium: 3.9 mmol/L (ref 3.5–5.1)
Sodium: 133 mmol/L — ABNORMAL LOW (ref 135–145)
Total Bilirubin: 0.7 mg/dL (ref 0.0–1.2)
Total Protein: 7.4 g/dL (ref 6.5–8.1)

## 2023-12-21 LAB — IRON AND TIBC
Iron: 83 ug/dL (ref 28–170)
Saturation Ratios: 20 % (ref 10.4–31.8)
TIBC: 406 ug/dL (ref 250–450)
UIBC: 323 ug/dL

## 2023-12-21 LAB — FERRITIN: Ferritin: 47 ng/mL (ref 11–307)

## 2023-12-21 LAB — MAGNESIUM: Magnesium: 1.8 mg/dL (ref 1.7–2.4)

## 2023-12-21 LAB — VITAMIN D 25 HYDROXY (VIT D DEFICIENCY, FRACTURES): Vit D, 25-Hydroxy: 86.02 ng/mL (ref 30–100)

## 2023-12-21 NOTE — Assessment & Plan Note (Addendum)
#   Recurrent breast cancer-ER positive PR negative ? HER-2/neu-currently on abema+ Anastrozole . FEB 2025- Stable postsurgical changes from breast reconstruction and right axillary dissection. Stable areas of scarring and fibrotic changes seen of the lungs. No developing new mass lesion, fluid collection or nodal enlargement.   # Continue Anastrazole [JAN Mid 2024-] for now. Continue with Abema [100 BID; off weekends] Labs today-ANC- 1.6  Hb-9-10; platelets-N.--  stable.  # CKD stage II- stable.   # Seasonal allergies-continue OTC antihistamines- stable.   # Hypomagnesia: 1.4- on OTC mag-  stable.  # DM- BG- 277- s/p endocrinology evaluation; with diet and medications [trucilicity; Novolog ]-   stable. # recurrent UTI/ ? Suprapubic tenderness- S/p eval with urology.  Monitor closely on Abema/with neutropenia- s/p evaluation with Kenney Peacemaker re: vaginal atrophy today-   stable..  # intermittent Hypercalcemia: Ca 10.4- monitor for now. FEB Vit D - 90; HOLD off calcium  and Vit D-   stable.  # Low B 12 [PCP]- on B12 PO.  stable.  # Flu shot today- 06/02/23.   # IV access; PIV.   Mychart- # DISPOSITION:PREFERS AM appt- NOT fridays # Follow-up in 1 month- -MD-  labs CBC CMP; magnesium ; CA 27-29  Dr. Geraldene Kleine

## 2023-12-21 NOTE — Progress Notes (Signed)
 Specialty Pharmacy Refill Coordination Note  Kimberly [name] is a 68 y.o. female contacted today regarding refills of specialty medication(s) Abemaciclib  (VERZENIO )   Patient requested (Patient-Rptd) Delivery   Delivery date: (Patient-Rptd) 12/23/23   Verified address: (Patient-Rptd) [address] [place], [place] [address]   Medication will be filled on 12/22/23.

## 2023-12-21 NOTE — Progress Notes (Signed)
 [name] OFFICE PROGRESS NOTE  Patient Care Team: Kimberly Grange, NP as PCP - General (Family Medicine) Kimberly Carrel, MD (Inactive) (General Surgery) Kimberly Flint, MD (Obstetrics and Gynecology) Kimberly Langdon, MD Kimberly Sear, MD (Unknown Physician Specialty) Kimberly Leos, MD as Consulting Physician (Hematology and Oncology)   Cancer Staging  Carcinoma of lower-outer quadrant of right breast in female, estrogen receptor positive [facility]) Staging form: Breast, AJCC 7th Edition - Pathologic: ypT1c,ypN2a, MX - Signed by Kimberly Earnest, MD on 03/10/2012 Cancer stage: ypT1c,ypN2a, MX    Oncology History Overview Note  # DEC 2012- RIGHT BREAST CA T2 N2 M0 tumor from biopsy.  Estrogen receptor positive, Progesterone receptor positive.  Current receptor negative by FISH 2. Neoadjuvant chemotherapy started in December of 28 with Cytoxan Adriamycin 3. Started on Taxol weekly chemotherapy. 4. Patient finished 12  cycles of Taxol chemotherapy in May of 2013.     5. Status post right modified radical mastectomy [Kimberly [name]; [place]] June of 2013, ypT1c  yp N2  MO. started also on Lerazole    7. Radiation therapy to the right breast (September of 2013).  Lymph node was positive for HER-2 receptor gene amplification of 2.22.  Will proceed with Herceptin treatment starting in September of 2013.   8.Patient has finished Herceptin (maintenance therapy) in August of 2014 8. Start patient on letrozole  from November, 2013. 9. Patient started on Herceptin in September 2013.    10Patient finished 12 months of Herceptin therapy on August, 2014  # 6. Status post left side prophylactic mastectomy.  #Late MAY 2018-RECURRENCE BREAST CA- ER positive/PR negative; ?? HER-2/neu- [biopsy- proven-mediastinal lymph node; in suff for her 2 testing].  [elevated Tumor marker- CT/PET- uptake in Right Mediastinal LN; Sternum [June 2018 EBUS- Dr.Kasa]   # March 10 2017- faslodex  +  Abema; OCT 5th CT-PR of mediastinal LN [consent]; Discontinued faslodex  sec to insurance issues [last DEC 2023]. JAN 2024- continue Abema + Anastrzole   #August 2020-left chest wall nodule biopsy benign  #August 2019- DVT-right upper extremity; Eliquis ; stop end of March 2021 [repeat ultrasound negative/patient preference]; # JAN 2022-Cirrhosis- ? On CT scan-FEB liver- Negative fro overt cirrhosis --------------------------------------------------------------- -    DIAGNOSIS: [ BREAST CANCER- ER/PR/HER2 NEU POS  STAGE:  4   ;GOALS: PALLIATIVE     Carcinoma of lower-outer quadrant of right breast in female, estrogen receptor positive (HCC)   INTERVAL HISTORY: Alone.  Ambulating independently.  Kimberly [name] 68 y.o.  female pleasant patient above history of metastatic ER PR positive HER-2/neu positive breast cancer currently on abema+ Anastrazole  is here for follow-up.  Doing well. No complaints today.   Taking all oral medications as prescribed. No major side effects. No fever.Taking vezenio; no side effects. No hot flashes from arimidex . Denies achiness.   Has chronic knee pain right. Appetite is good. Fatigued.   Review of Systems  Constitutional:  Positive for malaise/fatigue. Negative for chills, diaphoresis, fever and weight loss.  HENT:  Negative for nosebleeds and sore throat.   Eyes:  Negative for double vision.  Respiratory:  Negative for cough, hemoptysis, sputum production, shortness of breath and wheezing.   Cardiovascular:  Negative for chest pain, palpitations, orthopnea and leg swelling.  Gastrointestinal:  Negative for abdominal pain, blood in stool, constipation, heartburn, melena, nausea and vomiting.  Musculoskeletal:  Positive for back pain and joint pain.  Neurological:  Negative for dizziness, tingling, focal weakness, weakness and headaches.  Endo/Heme/Allergies:  Does not bruise/bleed easily.  Psychiatric/Behavioral:  Negative for depression. The  patient is not nervous/anxious and does not have insomnia.       PAST MEDICAL HISTORY :  Past Medical History:  Diagnosis Date   Arthritis    Cancer of lower-outer quadrant of female breast (HCC) 08/06/2011   RIGHT, chemo and bilateral mastectomies.   High cholesterol    History of kidney stones    Hx of bilateral breast implants    Hypertension    Lung metastases 20118   chemo pills and faslidex shots.   PONV (postoperative nausea and vomiting)    Type II diabetes mellitus (HCC)    fasting 140-150   Vertigo    LAST WEEK   Vertigo 01/2017    PAST SURGICAL HISTORY :   Past Surgical History:  Procedure Laterality Date   BREAST BIOPSY  07/2011, 2020   right   BREAST RECONSTRUCTION  03/07/2012   Procedure: BREAST RECONSTRUCTION;  Surgeon: Kimberly Grout, MD;  Location: [facility] OR;  Service: Plastics;  Laterality: Left;  BREAST RECONSTRUCTION WITH PLACEMENT OF TISSUE EXPANDER TO LEFT BREAST   BREAST RECONSTRUCTION  02/06/2013   CESAREAN SECTION  [id number]   ENDOBRONCHIAL ULTRASOUND N/A 02/24/2017   Procedure: ENDOBRONCHIAL ULTRASOUND;  Surgeon: Kimberly Dale, MD;  Location: [facility] ORS;  Service: Cardiopulmonary;  Laterality: N/A;   LATISSIMUS FLAP TO BREAST Right 02/06/2013   Procedure: LATISSIMUS FLAP TO RIGHT BREAST WITH IMPLANT;  Surgeon: Kimberly Grout, MD;  Location[facility] OR;  Service: Plastics;  Laterality: Right;   MASTECTOMY  03/07/12   modified right; total left   MODIFIED MASTECTOMY  03/07/2012   Procedure: MODIFIED MASTECTOMY;  Surgeon: Kimberly Earnest, MD;  Location: [facility] OR;  Service: General;  Laterality: Right;   PORTACATH PLACEMENT  07/2011   SCAR REVISION  03/30/2012   Procedure: SCAR REVISION;  Surgeon: Kimberly Earnest, MD;  Location: [facility];  Service: General;  Laterality: Right;  CLOSURE OF MASTECTOMY INCISION   WISDOM TOOTH EXTRACTION      FAMILY HISTORY :   Family History  Problem Relation Age of Onset   Breast cancer Mother    Diabetes Father      SOCIAL HISTORY:   Social History   Tobacco Use   Smoking status: Never   Smokeless tobacco: Never  Vaping Use   Vaping status: Never Used  Substance Use Topics   Alcohol  use: No   Drug use: No    ALLERGIES:  is allergic to sulfa antibiotics.  MEDICATIONS:  Current Outpatient Medications  Medication Sig Dispense Refill   abemaciclib  (VERZENIO ) 100 MG tablet Take 1 tablet (100 mg total) by mouth 2 (two) times daily. 56 tablet 1   albuterol  (VENTOLIN  HFA) 108 (90 Base) MCG/ACT inhaler Inhale 1-2 puffs into the lungs every 6 (six) hours as needed for wheezing or shortness of breath. 8 g 2   anastrozole  (ARIMIDEX ) 1 MG tablet TAKE 1 TABLET BY MOUTH EVERY DAY 90 tablet 2   atorvastatin  (LIPITOR) 20 MG tablet Take 1 tablet (20 mg total) by mouth daily. 90 tablet 3   Azelastine-Fluticasone 137-50 MCG/ACT SUSP Place 1 spray into the nose in the morning and at bedtime.     carvedilol  (COREG ) 3.125 MG tablet Take 1 tablet (3.125 mg total) by mouth 2 (two) times daily with a meal. 120 tablet 3   Cholecalciferol  50 MCG (2000 UT) CAPS Take 2,000 Units by mouth daily.     clotrimazole -betamethasone  (LOTRISONE ) cream Apply topically daily as needed. 30 g 0  Cranberry-Vitamin C-Probiotic (AZO CRANBERRY PO) Take 1 Capful by mouth in the morning and at bedtime.     cyanocobalamin  (VITAMIN B12) 1000 MCG tablet 1,000 mcg in the morning and at bedtime.     glipiZIDE  (GLUCOTROL  XL) 10 MG 24 hr tablet Take 1 tablet (10 mg total) by mouth 2 (two) times daily. 180 tablet 3   insulin  aspart (NOVOLOG  FLEXPEN) 100 UNIT/ML FlexPen Inject 4 Units into the skin 3 (three) times daily with meals. 15 mL 0   levothyroxine  (SYNTHROID ) 25 MCG tablet Take 1 tablet (25 mcg total) by mouth daily before breakfast. 90 tablet 1   loperamide  (IMODIUM  A-D) 2 MG tablet Take 2 mg by mouth 4 (four) times daily as needed for diarrhea or loose stools.     magnesium  oxide (MAG-OX) 400 MG tablet Take 1 tablet by mouth 2 (two)  times daily.     Melatonin 10 MG TABS Take 1 tablet by mouth at bedtime as needed.     montelukast  (SINGULAIR ) 10 MG tablet Take 1 tablet (10 mg total) by mouth at bedtime. 30 tablet 3   Olopatadine  HCl 0.2 % SOLN Apply 1 drop to eye daily.     ondansetron  (ZOFRAN -ODT) 4 MG disintegrating tablet Take 1 tablet (4 mg total) by mouth every 8 (eight) hours as needed for nausea or vomiting. 30 tablet 3   potassium chloride  (KLOR-CON  M) 10 MEQ tablet Take 1 tablet (10 mEq total) by mouth daily. 7 tablet 0   promethazine  (PHENERGAN ) 25 MG tablet Take 1 tablet (25 mg total) by mouth every 8 (eight) hours as needed for nausea or vomiting. 15 tablet 0   No current facility-administered medications for this visit.    PHYSICAL EXAMINATION: ECOG PERFORMANCE STATUS: 1 - Symptomatic but completely ambulatory  BP 128/69 (BP Location: Left Arm, Patient Position: Sitting, Cuff Size: Large)   Pulse 88   Temp 97.7 F (36.5 C) (Tympanic)   Resp 16   Ht 5' 2.5" (1.588 m)   Wt 154 lb 3.2 oz (69.9 kg)   SpO2 99%   BMI 27.75 kg/m   Filed Weights   12/21/23 1456  Weight: 154 lb 3.2 oz (69.9 kg)      Physical Exam Constitutional:      Comments: She is alone.   HENT:     Head: Normocephalic and atraumatic.     Mouth/Throat:     Pharynx: No oropharyngeal exudate.  Eyes:     Pupils: Pupils are equal, round, and reactive to light.  Cardiovascular:     Rate and Rhythm: Normal rate and regular rhythm.  Pulmonary:     Effort: Pulmonary effort is normal. No respiratory distress.     Breath sounds: Normal breath sounds. No wheezing.  Abdominal:     General: Bowel sounds are normal. There is no distension.     Palpations: Abdomen is soft. There is no mass.     Tenderness: There is no abdominal tenderness. There is no guarding or rebound.  Musculoskeletal:        General: No tenderness. Normal range of motion.     Cervical back: Normal range of motion and neck supple.     Comments: Approximately 1 cm  hard nodule felt in the anterior chest wall/left parasternal.  Skin:    General: Skin is warm.  Neurological:     Mental Status: She is alert and oriented to person, place, and time.  Psychiatric:        Mood and Affect: Affect  normal.    LABORATORY DATA:  I have reviewed the data as listed    Component Value Date/Time   NA 133 (L) 12/21/2023 1505   NA 135 11/28/2014 1401   K 3.9 12/21/2023 1505   K 4.7 11/28/2014 1401   CL 99 12/21/2023 1505   CL 99 (L) 11/28/2014 1401   CO2 25 12/21/2023 1505   CO2 28 11/28/2014 1401   GLUCOSE 277 (H) 12/21/2023 1505   GLUCOSE 215 (H) 11/28/2014 1401   BUN 18 12/21/2023 1505   BUN 16 11/28/2014 1401   CREATININE 1.02 (H) 12/21/2023 1505   CREATININE 0.96 11/28/2014 1401   CALCIUM  9.9 12/21/2023 1505   CALCIUM  9.9 11/28/2014 1401   PROT 7.4 12/21/2023 1505   PROT 7.6 11/28/2014 1401   ALBUMIN 3.5 12/21/2023 1505   ALBUMIN 4.5 11/28/2014 1401   AST 33 12/21/2023 1505   ALT 28 12/21/2023 1505   ALT 28 11/28/2014 1401   ALKPHOS 79 12/21/2023 1505   ALKPHOS 78 11/28/2014 1401   BILITOT 0.7 12/21/2023 1505   GFRNONAA 60 (L) 12/21/2023 1505   GFRNONAA >60 11/28/2014 1401   GFRAA >60 05/30/2020 1022   GFRAA >60 11/28/2014 1401    No results found for: "SPEP", "UPEP"  Lab Results  Component Value Date   WBC 3.2 (L) 12/21/2023   NEUTROABS 1.6 (L) 12/21/2023   HGB 10.3 (L) 12/21/2023   HCT 30.0 (L) 12/21/2023   MCV 99.3 12/21/2023   PLT 204 12/21/2023      Chemistry      Component Value Date/Time   NA 133 (L) 12/21/2023 1505   NA 135 11/28/2014 1401   K 3.9 12/21/2023 1505   K 4.7 11/28/2014 1401   CL 99 12/21/2023 1505   CL 99 (L) 11/28/2014 1401   CO2 25 12/21/2023 1505   CO2 28 11/28/2014 1401   BUN 18 12/21/2023 1505   BUN 16 11/28/2014 1401   CREATININE 1.02 (H) 12/21/2023 1505   CREATININE 0.96 11/28/2014 1401      Component Value Date/Time   CALCIUM  9.9 12/21/2023 1505   CALCIUM  9.9 11/28/2014 1401   ALKPHOS  79 12/21/2023 1505   ALKPHOS 78 11/28/2014 1401   AST 33 12/21/2023 1505   ALT 28 12/21/2023 1505   ALT 28 11/28/2014 1401   BILITOT 0.7 12/21/2023 1505       RADIOGRAPHIC STUDIES: I have personally reviewed the radiological images as listed and agreed with the findings in the report. No results found.   ASSESSMENT & PLAN:  Carcinoma of lower-outer quadrant of right breast in female, estrogen receptor positive (HCC)  # Recurrent breast cancer-ER positive PR negative ? HER-2/neu-currently on abema+ Anastrozole . FEB 2025- Stable postsurgical changes from breast reconstruction and right axillary dissection. Stable areas of scarring and fibrotic changes seen of the lungs. No developing new mass lesion, fluid collection or nodal enlargement.   # Continue Anastrazole [JAN Mid 2024-] for now. Continue with Abema [100 BID; off weekends] Labs today-ANC- 1.6  Hb-9-10; platelets-N.--  stable.  # CKD stage II- stable.   # Seasonal allergies-continue OTC antihistamines- stable.   # Hypomagnesia: 1.4- on OTC mag-  stable.  # DM- BG- 277- s/p endocrinology evaluation; with diet and medications [trucilicity; Novolog ]-   stable. # recurrent UTI/ ? Suprapubic tenderness- S/p eval with urology.  Monitor closely on Abema/with neutropenia- s/p evaluation with Kenney Peacemaker re: vaginal atrophy today-   stable..  # intermittent Hypercalcemia: Ca 10.4- monitor for now. FEB Vit  D - 90; HOLD off calcium  and Vit D-   stable.  # Low B 12 [PCP]- on B12 PO.  stable.  # Flu shot today- 06/02/23.   # IV access; PIV.   Mychart- # DISPOSITION:PREFERS AM appt- NOT fridays # Follow-up in 1 month- -MD-  labs CBC CMP; magnesium ; CA 27-29  Dr. Geraldene Kleine   Orders Placed This Encounter  Procedures   CBC with Differential (Cancer Center Only)    Standing Status:   Future    Expected Date:   01/20/2024    Expiration Date:   12/20/2024   CMP (Cancer Center only)    Standing Status:   Future    Expected Date:   01/20/2024     Expiration Date:   12/20/2024   Magnesium     Standing Status:   Future    Expected Date:   01/20/2024    Expiration Date:   12/20/2024   Cancer antigen 27.29    Standing Status:   Future    Expected Date:   01/20/2024    Expiration Date:   12/20/2024   Iron and TIBC    Standing Status:   Future    Expected Date:   01/20/2024    Expiration Date:   12/20/2024   Ferritin    Standing Status:   Future    Expected Date:   01/20/2024    Expiration Date:   12/20/2024   All questions were answered. The patient knows to call the clinic with any problems, questions or concerns.      Kimberly Leos, MD 12/21/2023 3:58 PM

## 2023-12-21 NOTE — Progress Notes (Signed)
 Pt would like to have next appts early morning Mon-Thurs.  Has a grand baby coming June 1st.

## 2023-12-22 ENCOUNTER — Other Ambulatory Visit: Payer: Self-pay

## 2023-12-22 ENCOUNTER — Other Ambulatory Visit: Payer: Self-pay | Admitting: Internal Medicine

## 2023-12-22 ENCOUNTER — Other Ambulatory Visit (HOSPITAL_COMMUNITY): Payer: Self-pay

## 2023-12-22 DIAGNOSIS — C50511 Malignant neoplasm of lower-outer quadrant of right female breast: Secondary | ICD-10-CM

## 2023-12-22 LAB — CANCER ANTIGEN 27.29: CA 27.29: 31.8 U/mL (ref 0.0–38.6)

## 2023-12-22 MED ORDER — ABEMACICLIB 100 MG PO TABS
100.0000 mg | ORAL_TABLET | Freq: Two times a day (BID) | ORAL | 1 refills | Status: DC
  Filled 2023-12-22: qty 56, 28d supply, fill #0
  Filled 2024-01-18: qty 56, 28d supply, fill #1

## 2024-01-18 ENCOUNTER — Other Ambulatory Visit: Payer: Self-pay

## 2024-01-18 NOTE — Progress Notes (Signed)
 Specialty Pharmacy Refill Coordination Note  Kimberly [name] is a 68 y.o. female contacted today regarding refills of specialty medication(s) Abemaciclib  (VERZENIO )   Patient requested (Patient-Rptd) Delivery   Delivery date: (Patient-Rptd) 01/20/24   Verified address: (Patient-Rptd) [address], [place], [place] [address]   Medication will be filled on 01/19/24.

## 2024-01-20 ENCOUNTER — Other Ambulatory Visit: Payer: Self-pay

## 2024-01-26 ENCOUNTER — Encounter: Payer: Self-pay | Admitting: Internal Medicine

## 2024-01-26 ENCOUNTER — Other Ambulatory Visit: Payer: Self-pay

## 2024-01-26 ENCOUNTER — Inpatient Hospital Stay: Admitting: Internal Medicine

## 2024-01-26 ENCOUNTER — Inpatient Hospital Stay: Attending: Internal Medicine

## 2024-01-26 VITALS — BP 101/63 | HR 77 | Temp 97.1°F | Resp 16 | Ht 62.5 in | Wt 154.1 lb

## 2024-01-26 DIAGNOSIS — M25561 Pain in right knee: Secondary | ICD-10-CM | POA: Diagnosis not present

## 2024-01-26 DIAGNOSIS — Z170 Estrogen receptor positive status [ER+]: Secondary | ICD-10-CM

## 2024-01-26 DIAGNOSIS — C50511 Malignant neoplasm of lower-outer quadrant of right female breast: Secondary | ICD-10-CM

## 2024-01-26 DIAGNOSIS — Z1731 Human epidermal growth factor receptor 2 positive status: Secondary | ICD-10-CM | POA: Insufficient documentation

## 2024-01-26 DIAGNOSIS — R0602 Shortness of breath: Secondary | ICD-10-CM

## 2024-01-26 DIAGNOSIS — Z1721 Progesterone receptor positive status: Secondary | ICD-10-CM | POA: Insufficient documentation

## 2024-01-26 DIAGNOSIS — Z79899 Other long term (current) drug therapy: Secondary | ICD-10-CM | POA: Insufficient documentation

## 2024-01-26 DIAGNOSIS — Z79811 Long term (current) use of aromatase inhibitors: Secondary | ICD-10-CM | POA: Insufficient documentation

## 2024-01-26 DIAGNOSIS — C7800 Secondary malignant neoplasm of unspecified lung: Secondary | ICD-10-CM | POA: Diagnosis not present

## 2024-01-26 DIAGNOSIS — G8929 Other chronic pain: Secondary | ICD-10-CM | POA: Insufficient documentation

## 2024-01-26 LAB — CBC WITH DIFFERENTIAL (CANCER CENTER ONLY)
Abs Immature Granulocytes: 0.01 10*3/uL (ref 0.00–0.07)
Basophils Absolute: 0.1 10*3/uL (ref 0.0–0.1)
Basophils Relative: 3 %
Eosinophils Absolute: 0.1 10*3/uL (ref 0.0–0.5)
Eosinophils Relative: 4 %
HCT: 31 % — ABNORMAL LOW (ref 36.0–46.0)
Hemoglobin: 10.5 g/dL — ABNORMAL LOW (ref 12.0–15.0)
Immature Granulocytes: 0 %
Lymphocytes Relative: 42 %
Lymphs Abs: 1.1 10*3/uL (ref 0.7–4.0)
MCH: 33.9 pg (ref 26.0–34.0)
MCHC: 33.9 g/dL (ref 30.0–36.0)
MCV: 100 fL (ref 80.0–100.0)
Monocytes Absolute: 0.2 10*3/uL (ref 0.1–1.0)
Monocytes Relative: 6 %
Neutro Abs: 1.2 10*3/uL — ABNORMAL LOW (ref 1.7–7.7)
Neutrophils Relative %: 45 %
Platelet Count: 192 10*3/uL (ref 150–400)
RBC: 3.1 MIL/uL — ABNORMAL LOW (ref 3.87–5.11)
RDW: 13.4 % (ref 11.5–15.5)
WBC Count: 2.6 10*3/uL — ABNORMAL LOW (ref 4.0–10.5)
nRBC: 0 % (ref 0.0–0.2)

## 2024-01-26 LAB — CMP (CANCER CENTER ONLY)
ALT: 169 U/L — ABNORMAL HIGH (ref 0–44)
AST: 114 U/L — ABNORMAL HIGH (ref 15–41)
Albumin: 3.6 g/dL (ref 3.5–5.0)
Alkaline Phosphatase: 108 U/L (ref 38–126)
Anion gap: 7 (ref 5–15)
BUN: 24 mg/dL — ABNORMAL HIGH (ref 8–23)
CO2: 22 mmol/L (ref 22–32)
Calcium: 9.3 mg/dL (ref 8.9–10.3)
Chloride: 106 mmol/L (ref 98–111)
Creatinine: 1.14 mg/dL — ABNORMAL HIGH (ref 0.44–1.00)
GFR, Estimated: 52 mL/min — ABNORMAL LOW (ref >60)
Glucose, Bld: 73 mg/dL (ref 70–99)
Potassium: 3.7 mmol/L (ref 3.5–5.1)
Sodium: 135 mmol/L (ref 135–145)
Total Bilirubin: 0.5 mg/dL (ref 0.0–1.2)
Total Protein: 7 g/dL (ref 6.5–8.1)

## 2024-01-26 LAB — FERRITIN: Ferritin: 63 ng/mL (ref 11–307)

## 2024-01-26 LAB — IRON AND TIBC
Iron: 83 ug/dL (ref 28–170)
Saturation Ratios: 21 % (ref 10.4–31.8)
TIBC: 392 ug/dL (ref 250–450)
UIBC: 309 ug/dL

## 2024-01-26 LAB — MAGNESIUM: Magnesium: 1.9 mg/dL (ref 1.7–2.4)

## 2024-01-26 LAB — VITAMIN D 25 HYDROXY (VIT D DEFICIENCY, FRACTURES): Vit D, 25-Hydroxy: 89.22 ng/mL (ref 30–100)

## 2024-01-26 MED ORDER — TRAMADOL HCL 50 MG PO TABS: 50.0000 mg | ORAL_TABLET | Freq: Three times a day (TID) | ORAL | 0 refills | Status: AC | PRN

## 2024-01-26 NOTE — Progress Notes (Signed)
 On her next appt she would like June 25th if possible.

## 2024-01-26 NOTE — Assessment & Plan Note (Addendum)
#   Recurrent breast cancer-ER positive PR negative ? HER-2/neu-currently on abema+ Anastrozole . FEB 2025- Stable postsurgical changes from breast reconstruction and right axillary dissection. Stable areas of scarring and fibrotic changes seen of the lungs. No developing new mass lesion, fluid collection or nodal enlargement.   # Continue Anastrazole [JAN Mid 2024-] for now. Given LFTs/ mild neutropenia [possible ortho] Abema [100 BID; off weekends] Labs today-ANC- 1.2  Hb-9-10; platelets-N.-  # LFTs- elevated- x2-3; Normal bilirubin- ? Etiology- no alcohol ; up to 4 tylenols per day. STAY OFF tylenol ; ok with advil   # Right Knee pain- no concerns of septic artritis  up to 4 tylenols per day. On advil [Emerge Otho]; will start tramadol; HOLD abema [in case of any surgery/steroid injection]  # CKD stage II- stable.   # Seasonal allergies-continue OTC antihistamines-  stable.  # Hypomagnesia: 1.4- on OTC mag-   stable.  # DM- s/p endocrinology evaluation; with diet and medications [trucilicity; Novolog ]-   stable.  # recurrent UTI/ ? Suprapubic tenderness- S/p eval with urology.  Monitor closely on Abema/with neutropenia- s/p evaluation with Kenney Peacemaker re: vaginal atrophy today-   stable..  # intermittent Hypercalcemia: Ca 10.4- monitor for now. FEB Vit D - 90; HOLD off calcium  and Vit D-   stable.  # Low B 12 [PCP]- on B12 PO.   stable.  # Flu shot today- 06/02/23.   # IV access; PIV.   Q45m; :PREFERS AM appt- NOT Fridays  Mychart- # DISPOSITION # Follow-up  on 26th June afternoon-OK -MD-  labs CBC CMP; magnesium ; CA 27-29 - Dr. Geraldene Kleine

## 2024-01-26 NOTE — Patient Instructions (Addendum)
#   Stop taking Verzenio  until the next visit  # Do not take any Tylenol -take Advil up to 2-3 a day; and take tramadol as recommended.  You got

## 2024-01-26 NOTE — Progress Notes (Signed)
 [facility] OFFICE PROGRESS NOTE  Patient Care Team: Efraim Grange, NP as PCP - General (Family Medicine) Burma Carrel, MD (Inactive) (General Surgery) Thora Flint, MD (Obstetrics and Gynecology) Glenis Langdon, MD Malone Sear, MD (Unknown Physician Specialty) Gwyn Leos, MD as Consulting Physician (Hematology and Oncology)   Cancer Staging  Carcinoma of lower-outer quadrant of right breast in female, estrogen receptor positive [facility]) Staging form: Breast, AJCC 7th Edition - Pathologic: ypT1c,ypN2a, MX - Signed by Darcella Earnest, MD on 03/10/2012 Cancer stage: ypT1c,ypN2a, MX    Oncology History Overview Note  # DEC 2012- RIGHT BREAST CA T2 N2 M0 tumor from biopsy.  Estrogen receptor positive, Progesterone receptor positive.  Current receptor negative by FISH 2. Neoadjuvant chemotherapy started in December of 28 with Cytoxan Adriamycin 3. Started on Taxol weekly chemotherapy. 4. Patient finished 12  cycles of Taxol chemotherapy in May of 2013.     5. Status post right modified radical mastectomy [Dr.Bowers; [place]] June of 2013, ypT1c  yp N2  MO. started also on Lerazole    7. Radiation therapy to the right breast (September of 2013).  Lymph node was positive for HER-2 receptor gene amplification of 2.22.  Will proceed with Herceptin treatment starting in September of 2013.   8.Patient has finished Herceptin (maintenance therapy) in August of 2014 8. Start patient on letrozole  from November, 2013. 9. Patient started on Herceptin in September 2013.    10Patient finished 12 months of Herceptin therapy on August, 2014  # 6. Status post left side prophylactic mastectomy.  #Late MAY 2018-RECURRENCE BREAST CA- ER positive/PR negative; ?? HER-2/neu- [biopsy- proven-mediastinal lymph node; in suff for her 2 testing].  [elevated Tumor marker- CT/PET- uptake in Right Mediastinal LN; Sternum [June 2018 EBUS- Dr.Kasa]   # March 10 2017- faslodex  +  Abema; OCT 5th CT-PR of mediastinal LN [consent]; Discontinued faslodex  sec to insurance issues [last DEC 2023]. JAN 2024- continue Abema + Anastrzole   #August 2020-left chest wall nodule biopsy benign  #August 2019- DVT-right upper extremity; Eliquis ; stop end of March 2021 [repeat ultrasound negative/patient preference]; # JAN 2022-Cirrhosis- ? On CT scan-FEB liver- Negative fro overt cirrhosis --------------------------------------------------------------- -    DIAGNOSIS: [ BREAST CANCER- ER/PR/HER2 NEU POS  STAGE:  4   ;GOALS: PALLIATIVE     Carcinoma of lower-outer quadrant of right breast in female, estrogen receptor positive (HCC)   INTERVAL HISTORY: Alone.  Ambulating independently.  Malu Pellegrini Cowgill 68 y.o.  female pleasant patient above history of metastatic ER PR positive HER-2/neu positive breast cancer currently on abema [M-F; no weekends]+ Anastrazole  is here for follow-up.  Overall doing well.Patient taking all oral medications as prescribed. No major side effects. No fever.  Taking vezenio; no side effects. Mild  hot flashes from arimidex . Denies achiness.  Has chronic knee pain right- worse recently.   Appetite is good. Fatigued.   Review of Systems  Constitutional:  Positive for malaise/fatigue. Negative for chills, diaphoresis, fever and weight loss.  HENT:  Negative for nosebleeds and sore throat.   Eyes:  Negative for double vision.  Respiratory:  Negative for cough, hemoptysis, sputum production, shortness of breath and wheezing.   Cardiovascular:  Negative for chest pain, palpitations, orthopnea and leg swelling.  Gastrointestinal:  Negative for abdominal pain, blood in stool, constipation, heartburn, melena, nausea and vomiting.  Musculoskeletal:  Positive for back pain and joint pain.  Neurological:  Negative for dizziness, tingling, focal weakness, weakness and headaches.  Endo/Heme/Allergies:  Does not bruise/bleed easily.   Psychiatric/Behavioral:  Negative for depression. The patient is not nervous/anxious and does not have insomnia.       PAST MEDICAL HISTORY :  Past Medical History:  Diagnosis Date   Arthritis    Cancer of lower-outer quadrant of female breast (HCC) 08/06/2011   RIGHT, chemo and bilateral mastectomies.   High cholesterol    History of kidney stones    Hx of bilateral breast implants    Hypertension    Lung metastases 20118   chemo pills and faslidex shots.   PONV (postoperative nausea and vomiting)    Type II diabetes mellitus (HCC)    fasting 140-150   Vertigo    LAST WEEK   Vertigo 01/2017    PAST SURGICAL HISTORY :   Past Surgical History:  Procedure Laterality Date   BREAST BIOPSY  07/2011, 2020   right   BREAST RECONSTRUCTION  03/07/2012   Procedure: BREAST RECONSTRUCTION;  Surgeon: Elidia Grout, MD;  Location: [facility] OR;  Service: Plastics;  Laterality: Left;  BREAST RECONSTRUCTION WITH PLACEMENT OF TISSUE EXPANDER TO LEFT BREAST   BREAST RECONSTRUCTION  02/06/2013   CESAREAN SECTION  [id number]   ENDOBRONCHIAL ULTRASOUND N/A 02/24/2017   Procedure: ENDOBRONCHIAL ULTRASOUND;  Surgeon: Cleve Dale, MD;  Location: [facility] ORS;  Service: Cardiopulmonary;  Laterality: N/A;   LATISSIMUS FLAP TO BREAST Right 02/06/2013   Procedure: LATISSIMUS FLAP TO RIGHT BREAST WITH IMPLANT;  Surgeon: Elidia Grout, MD;  Location: [facility] OR;  Service: Plastics;  Laterality: Right;   MASTECTOMY  03/07/12   modified right; total left   MODIFIED MASTECTOMY  03/07/2012   Procedure: MODIFIED MASTECTOMY;  Surgeon: Darcella Earnest, MD;  Location: [facility] OR;  Service: General;  Laterality: Right;   PORTACATH PLACEMENT  07/2011   SCAR REVISION  03/30/2012   Procedure: SCAR REVISION;  Surgeon: Darcella Earnest, MD;  Location:  SURGERY CENTER;  Service: General;  Laterality: Right;  CLOSURE OF MASTECTOMY INCISION   WISDOM TOOTH EXTRACTION      FAMILY HISTORY :   Family History  Problem Relation Age of  Onset   Breast cancer Mother    Diabetes Father     SOCIAL HISTORY:   Social History   Tobacco Use   Smoking status: Never   Smokeless tobacco: Never  Vaping Use   Vaping status: Never Used  Substance Use Topics   Alcohol  use: No   Drug use: No    ALLERGIES:  is allergic to sulfa antibiotics.  MEDICATIONS:  Current Outpatient Medications  Medication Sig Dispense Refill   abemaciclib  (VERZENIO ) 100 MG tablet Take 1 tablet (100 mg total) by mouth 2 (two) times daily. 56 tablet 1   anastrozole  (ARIMIDEX ) 1 MG tablet TAKE 1 TABLET BY MOUTH EVERY DAY 90 tablet 2   atorvastatin  (LIPITOR) 20 MG tablet Take 1 tablet (20 mg total) by mouth daily. 90 tablet 3   Azelastine-Fluticasone 137-50 MCG/ACT SUSP Place 1 spray into the nose in the morning and at bedtime.     carvedilol  (COREG ) 3.125 MG tablet Take 1 tablet (3.125 mg total) by mouth 2 (two) times daily with a meal. 120 tablet 3   Cholecalciferol  50 MCG (2000 UT) CAPS Take 2,000 Units by mouth daily.     clotrimazole -betamethasone  (LOTRISONE ) cream Apply topically daily as needed. 30 g 0   Cranberry-Vitamin C-Probiotic (AZO CRANBERRY PO) Take 1 Capful by mouth in the morning and at bedtime.     cyanocobalamin  (VITAMIN B12) 1000  MCG tablet 1,000 mcg in the morning and at bedtime.     glipiZIDE  (GLUCOTROL  XL) 10 MG 24 hr tablet Take 1 tablet (10 mg total) by mouth 2 (two) times daily. 180 tablet 3   insulin  aspart (NOVOLOG  FLEXPEN) 100 UNIT/ML FlexPen Inject 4 Units into the skin 3 (three) times daily with meals. 15 mL 0   levothyroxine  (SYNTHROID ) 25 MCG tablet Take 1 tablet (25 mcg total) by mouth daily before breakfast. 90 tablet 1   loperamide  (IMODIUM  A-D) 2 MG tablet Take 2 mg by mouth 4 (four) times daily as needed for diarrhea or loose stools.     magnesium  oxide (MAG-OX) 400 MG tablet Take 1 tablet by mouth 2 (two) times daily.     Melatonin 10 MG TABS Take 1 tablet by mouth at bedtime as needed.     montelukast  (SINGULAIR ) 10  MG tablet Take 1 tablet (10 mg total) by mouth at bedtime. 30 tablet 3   Olopatadine  HCl 0.2 % SOLN Apply 1 drop to eye daily as needed.     ondansetron  (ZOFRAN -ODT) 4 MG disintegrating tablet Take 1 tablet (4 mg total) by mouth every 8 (eight) hours as needed for nausea or vomiting. 30 tablet 3   potassium chloride  (KLOR-CON  M) 10 MEQ tablet Take 1 tablet (10 mEq total) by mouth daily. 7 tablet 0   promethazine  (PHENERGAN ) 25 MG tablet Take 1 tablet (25 mg total) by mouth every 8 (eight) hours as needed for nausea or vomiting. 15 tablet 0   traMADol  (ULTRAM ) 50 MG tablet Take 1 tablet (50 mg total) by mouth every 8 (eight) hours as needed. 60 tablet 0   No current facility-administered medications for this visit.    PHYSICAL EXAMINATION: ECOG PERFORMANCE STATUS: 1 - Symptomatic but completely ambulatory  BP 101/63 (BP Location: Left Arm, Patient Position: Sitting, Cuff Size: Normal)   Pulse 77   Temp (!) 97.1 F (36.2 C) (Tympanic)   Resp 16   Ht 5' 2.5" (1.588 m)   Wt 154 lb 1.6 oz (69.9 kg)   SpO2 99%   BMI 27.74 kg/m   Filed Weights   01/26/24 0906  Weight: 154 lb 1.6 oz (69.9 kg)      Physical Exam Constitutional:      Comments: She is alone.   HENT:     Head: Normocephalic and atraumatic.     Mouth/Throat:     Pharynx: No oropharyngeal exudate.  Eyes:     Pupils: Pupils are equal, round, and reactive to light.  Cardiovascular:     Rate and Rhythm: Normal rate and regular rhythm.  Pulmonary:     Effort: Pulmonary effort is normal. No respiratory distress.     Breath sounds: Normal breath sounds. No wheezing.  Abdominal:     General: Bowel sounds are normal. There is no distension.     Palpations: Abdomen is soft. There is no mass.     Tenderness: There is no abdominal tenderness. There is no guarding or rebound.  Musculoskeletal:        General: No tenderness. Normal range of motion.     Cervical back: Normal range of motion and neck supple.     Comments:  Approximately 1 cm hard nodule felt in the anterior chest wall/left parasternal.  Skin:    General: Skin is warm.  Neurological:     Mental Status: She is alert and oriented to person, place, and time.  Psychiatric:        Mood  and Affect: Affect normal.    LABORATORY DATA:  I have reviewed the data as listed    Component Value Date/Time   NA 135 01/26/2024 0914   NA 135 11/28/2014 1401   K 3.7 01/26/2024 0914   K 4.7 11/28/2014 1401   CL 106 01/26/2024 0914   CL 99 (L) 11/28/2014 1401   CO2 22 01/26/2024 0914   CO2 28 11/28/2014 1401   GLUCOSE 73 01/26/2024 0914   GLUCOSE 215 (H) 11/28/2014 1401   BUN 24 (H) 01/26/2024 0914   BUN 16 11/28/2014 1401   CREATININE 1.14 (H) 01/26/2024 0914   CREATININE 0.96 11/28/2014 1401   CALCIUM  9.3 01/26/2024 0914   CALCIUM  9.9 11/28/2014 1401   PROT 7.0 01/26/2024 0914   PROT 7.6 11/28/2014 1401   ALBUMIN 3.6 01/26/2024 0914   ALBUMIN 4.5 11/28/2014 1401   AST 114 (H) 01/26/2024 0914   ALT 169 (H) 01/26/2024 0914   ALT 28 11/28/2014 1401   ALKPHOS 108 01/26/2024 0914   ALKPHOS 78 11/28/2014 1401   BILITOT 0.5 01/26/2024 0914   GFRNONAA 52 (L) 01/26/2024 0914   GFRNONAA >60 11/28/2014 1401   GFRAA >60 05/30/2020 1022   GFRAA >60 11/28/2014 1401    No results found for: "SPEP", "UPEP"  Lab Results  Component Value Date   WBC 2.6 (L) 01/26/2024   NEUTROABS 1.2 (L) 01/26/2024   HGB 10.5 (L) 01/26/2024   HCT 31.0 (L) 01/26/2024   MCV 100.0 01/26/2024   PLT 192 01/26/2024      Chemistry      Component Value Date/Time   NA 135 01/26/2024 0914   NA 135 11/28/2014 1401   K 3.7 01/26/2024 0914   K 4.7 11/28/2014 1401   CL 106 01/26/2024 0914   CL 99 (L) 11/28/2014 1401   CO2 22 01/26/2024 0914   CO2 28 11/28/2014 1401   BUN 24 (H) 01/26/2024 0914   BUN 16 11/28/2014 1401   CREATININE 1.14 (H) 01/26/2024 0914   CREATININE 0.96 11/28/2014 1401      Component Value Date/Time   CALCIUM  9.3 01/26/2024 0914   CALCIUM  9.9  11/28/2014 1401   ALKPHOS 108 01/26/2024 0914   ALKPHOS 78 11/28/2014 1401   AST 114 (H) 01/26/2024 0914   ALT 169 (H) 01/26/2024 0914   ALT 28 11/28/2014 1401   BILITOT 0.5 01/26/2024 0914       RADIOGRAPHIC STUDIES: I have personally reviewed the radiological images as listed and agreed with the findings in the report. No results found.   ASSESSMENT & PLAN:  Carcinoma of lower-outer quadrant of right breast in female, estrogen receptor positive (HCC)  # Recurrent breast cancer-ER positive PR negative ? HER-2/neu-currently on abema+ Anastrozole . FEB 2025- Stable postsurgical changes from breast reconstruction and right axillary dissection. Stable areas of scarring and fibrotic changes seen of the lungs. No developing new mass lesion, fluid collection or nodal enlargement.   # Continue Anastrazole [JAN Mid 2024-] for now. Given LFTs/ mild neutropenia [possible ortho] Abema [100 BID; off weekends] Labs today-ANC- 1.2  Hb-9-10; platelets-N.-  # LFTs- elevated- x2-3; Normal bilirubin- ? Etiology- no alcohol ; up to 4 tylenols per day. STAY OFF tylenol ; ok with advil   # Right Knee pain- no concerns of septic artritis  up to 4 tylenols per day. On advil [Emerge Otho]; will start tramadol; HOLD abema [in case of any surgery/steroid injection]  # CKD stage II- stable.   # Seasonal allergies-continue OTC antihistamines-  stable.  #  Hypomagnesia: 1.4- on OTC mag-   stable.  # DM- s/p endocrinology evaluation; with diet and medications [trucilicity; Novolog ]-   stable.  # recurrent UTI/ ? Suprapubic tenderness- S/p eval with urology.  Monitor closely on Abema/with neutropenia- s/p evaluation with Kenney Peacemaker re: vaginal atrophy today-   stable..  # intermittent Hypercalcemia: Ca 10.4- monitor for now. FEB Vit D - 90; HOLD off calcium  and Vit D-   stable.  # Low B 12 [PCP]- on B12 PO.   stable.  # Flu shot today- 06/02/23.   # IV access; PIV.   Q35m; :PREFERS AM appt- NOT  Fridays  Mychart- # DISPOSITION # Follow-up  on 26th June afternoon-OK -MD-  labs CBC CMP; magnesium ; CA 27-29 - Dr. B   Orders Placed This Encounter  Procedures   VITAMIN D  25 Hydroxy (Vit-D Deficiency, Fractures)    Standing Status:   Future    Number of Occurrences:   1    Expected Date:   01/26/2024    Expiration Date:   01/25/2025   CBC with Differential (Cancer Center Only)    Standing Status:   Future    Expected Date:   02/24/2024    Expiration Date:   01/25/2025   CMP (Cancer Center only)    Standing Status:   Future    Expected Date:   02/24/2024    Expiration Date:   01/25/2025   Magnesium     Standing Status:   Future    Expected Date:   02/24/2024    Expiration Date:   01/25/2025   Cancer antigen 27.29    Standing Status:   Future    Expected Date:   02/24/2024    Expiration Date:   01/25/2025   All questions were answered. The patient knows to call the clinic with any problems, questions or concerns.      Gwyn Leos, MD 01/26/2024 11:06 AM

## 2024-01-27 LAB — CANCER ANTIGEN 27.29: CA 27.29: 28.8 U/mL (ref 0.0–38.6)

## 2024-02-16 ENCOUNTER — Other Ambulatory Visit: Payer: Self-pay

## 2024-02-16 ENCOUNTER — Encounter (INDEPENDENT_AMBULATORY_CARE_PROVIDER_SITE_OTHER): Payer: Self-pay

## 2024-02-16 ENCOUNTER — Other Ambulatory Visit: Payer: Self-pay | Admitting: Internal Medicine

## 2024-02-16 DIAGNOSIS — Z170 Estrogen receptor positive status [ER+]: Secondary | ICD-10-CM

## 2024-02-16 MED ORDER — ABEMACICLIB 100 MG PO TABS
100.0000 mg | ORAL_TABLET | Freq: Two times a day (BID) | ORAL | 1 refills | Status: DC
  Filled 2024-02-16 (×2): qty 56, 28d supply, fill #0
  Filled 2024-03-12: qty 56, 28d supply, fill #1

## 2024-02-16 NOTE — Progress Notes (Signed)
 Specialty Pharmacy Refill Coordination Note  EDIA PURSIFULL is a 68 y.o. female contacted today regarding refills of specialty medication(s) Abemaciclib  (VERZENIO )   Patient requested (Patient-Rptd) Delivery   Delivery date: 02/20/24   Verified address: (Patient-Rptd) [address] [place], [place] [address]   Medication will be filled on 02/17/24.

## 2024-02-17 ENCOUNTER — Other Ambulatory Visit: Payer: Self-pay

## 2024-02-20 ENCOUNTER — Other Ambulatory Visit: Payer: Self-pay

## 2024-02-21 ENCOUNTER — Other Ambulatory Visit: Payer: Self-pay

## 2024-02-24 ENCOUNTER — Encounter: Payer: Self-pay | Admitting: Internal Medicine

## 2024-02-24 ENCOUNTER — Inpatient Hospital Stay: Attending: Internal Medicine

## 2024-02-24 ENCOUNTER — Inpatient Hospital Stay: Admitting: Internal Medicine

## 2024-02-24 DIAGNOSIS — Z79899 Other long term (current) drug therapy: Secondary | ICD-10-CM | POA: Insufficient documentation

## 2024-02-24 DIAGNOSIS — Z1721 Progesterone receptor positive status: Secondary | ICD-10-CM | POA: Diagnosis not present

## 2024-02-24 DIAGNOSIS — Z79811 Long term (current) use of aromatase inhibitors: Secondary | ICD-10-CM | POA: Diagnosis not present

## 2024-02-24 DIAGNOSIS — Z170 Estrogen receptor positive status [ER+]: Secondary | ICD-10-CM | POA: Diagnosis not present

## 2024-02-24 DIAGNOSIS — C7800 Secondary malignant neoplasm of unspecified lung: Secondary | ICD-10-CM | POA: Diagnosis not present

## 2024-02-24 DIAGNOSIS — C50511 Malignant neoplasm of lower-outer quadrant of right female breast: Secondary | ICD-10-CM

## 2024-02-24 DIAGNOSIS — Z9013 Acquired absence of bilateral breasts and nipples: Secondary | ICD-10-CM | POA: Insufficient documentation

## 2024-02-24 DIAGNOSIS — Z86718 Personal history of other venous thrombosis and embolism: Secondary | ICD-10-CM | POA: Insufficient documentation

## 2024-02-24 DIAGNOSIS — Z1731 Human epidermal growth factor receptor 2 positive status: Secondary | ICD-10-CM | POA: Diagnosis not present

## 2024-02-24 LAB — MAGNESIUM: Magnesium: 1.9 mg/dL (ref 1.7–2.4)

## 2024-02-24 LAB — CMP (CANCER CENTER ONLY)
ALT: 26 U/L (ref 0–44)
AST: 35 U/L (ref 15–41)
Albumin: 3.5 g/dL (ref 3.5–5.0)
Alkaline Phosphatase: 103 U/L (ref 38–126)
Anion gap: 8 (ref 5–15)
BUN: 26 mg/dL — ABNORMAL HIGH (ref 8–23)
CO2: 23 mmol/L (ref 22–32)
Calcium: 9.6 mg/dL (ref 8.9–10.3)
Chloride: 102 mmol/L (ref 98–111)
Creatinine: 0.97 mg/dL (ref 0.44–1.00)
GFR, Estimated: >60 mL/min (ref >60)
Glucose, Bld: 261 mg/dL — ABNORMAL HIGH (ref 70–99)
Potassium: 3.7 mmol/L (ref 3.5–5.1)
Sodium: 133 mmol/L — ABNORMAL LOW (ref 135–145)
Total Bilirubin: 0.9 mg/dL (ref 0.0–1.2)
Total Protein: 6.9 g/dL (ref 6.5–8.1)

## 2024-02-24 LAB — CBC WITH DIFFERENTIAL (CANCER CENTER ONLY)
Abs Immature Granulocytes: 0.02 10*3/uL (ref 0.00–0.07)
Basophils Absolute: 0.1 10*3/uL (ref 0.0–0.1)
Basophils Relative: 1 %
Eosinophils Absolute: 0.1 10*3/uL (ref 0.0–0.5)
Eosinophils Relative: 2 %
HCT: 31.8 % — ABNORMAL LOW (ref 36.0–46.0)
Hemoglobin: 10.8 g/dL — ABNORMAL LOW (ref 12.0–15.0)
Immature Granulocytes: 0 %
Lymphocytes Relative: 29 %
Lymphs Abs: 1.4 10*3/uL (ref 0.7–4.0)
MCH: 33.1 pg (ref 26.0–34.0)
MCHC: 34 g/dL (ref 30.0–36.0)
MCV: 97.5 fL (ref 80.0–100.0)
Monocytes Absolute: 0.5 10*3/uL (ref 0.1–1.0)
Monocytes Relative: 10 %
Neutro Abs: 2.8 10*3/uL (ref 1.7–7.7)
Neutrophils Relative %: 58 %
Platelet Count: 251 10*3/uL (ref 150–400)
RBC: 3.26 MIL/uL — ABNORMAL LOW (ref 3.87–5.11)
RDW: 12.9 % (ref 11.5–15.5)
WBC Count: 4.9 10*3/uL (ref 4.0–10.5)
nRBC: 0 % (ref 0.0–0.2)

## 2024-02-24 NOTE — Assessment & Plan Note (Addendum)
#   Recurrent breast cancer-ER positive PR negative ? HER-2/neu-currently on abema+ Anastrozole . FEB 2025- Stable postsurgical changes from breast reconstruction and right axillary dissection. Stable areas of scarring and fibrotic changes seen of the lungs. No developing new mass lesion, fluid collection or nodal enlargement.  # Continue Anastrazole [JAN Mid 2024-]-given improvement of LFTs/and neutropenia-okay to restart Verzenio .  Discussed regarding starting at lower dose 50 mg-however patient wants to try the 100 mg twice daily Monday through Friday-[going to the beach].  If continued LFT elevation/neutropenia-lower the dose to 50 twice daily next visit.  Plan repeat imaging again in 4 months or so.  # LFTs- elevated- x2-3; Normal bilirubin- [possibly Tylenol  versus Verzenio ]-resolved.  Monitor closely on Verzinio-   # Right Knee pain-  [Emerge Otho]; will start tramadol ; HOLD Tylenol .   # CKD stage II- stable.   # Seasonal allergies-continue OTC antihistamines- stable.   # Hypomagnesia: 1.4- on OTC mag-   stable.  # DM- s/p endocrinology evaluation; with diet and medications [trucilicity; Novolog ]-    stable.  # recurrent UTI/ ? Suprapubic tenderness- S/p eval with urology.  Monitor closely on Abema/with neutropenia- s/p evaluation with Tinnie Dawn re: vaginal atrophy today-    stable.  # intermittent Hypercalcemia: Ca 10.4- monitor for now. FEB Vit D - 90; HOLD off calcium  and Vit D-   stable.  # Low B 12 [PCP]- on B12 PO.   stable.  # Flu shot today- 06/02/23.   # IV access; PIV.   Q32m; :PREFERS AM appt- NOT Fridays  Mychart- # DISPOSITION: PREFERS AM appt- NOT Fridays # Follow-up  1 month--MD-  labs CBC CMP; magnesium ; CA 27-29 - Dr. KATHEE

## 2024-02-24 NOTE — Progress Notes (Signed)
 [name]  Patient Care Team: Steva Clotilda DEL, NP as PCP - General (Family Medicine) Merrilyn Handler, MD (Inactive) (General Surgery) Curlene Agent, MD (Obstetrics and Gynecology) Lenn Aran, MD Wilder Pink, MD (Unknown Physician Specialty) Rennie Cindy SAUNDERS, MD as Consulting Physician (Hematology and Oncology)   Cancer Staging  Carcinoma of lower-outer quadrant of right breast in female, estrogen receptor positive [facility]) Staging form: Breast, AJCC 7th Edition - Pathologic: ypT1c,ypN2a, MX - Signed by Merrilyn Handler PARAS, MD on 03/10/2012 Cancer stage: ypT1c,ypN2a, MX    Oncology History Overview Note  # DEC 2012- RIGHT BREAST CA T2 N2 M0 tumor from biopsy.  Estrogen receptor positive, Progesterone receptor positive.  Current receptor negative by FISH 2. Neoadjuvant chemotherapy started in December of 28 with Cytoxan Adriamycin 3. Started on Taxol weekly chemotherapy. 4. Patient finished 12  cycles of Taxol chemotherapy in May of 2013.     5. Status post right modified radical mastectomy [Dr.Bowers; [place]] June of 2013, ypT1c  yp N2  MO. started also on Lerazole    7. Radiation therapy to the right breast (September of 2013).  Lymph node was positive for HER-2 receptor gene amplification of 2.22.  Will proceed with Herceptin treatment starting in September of 2013.   8.Patient has finished Herceptin (maintenance therapy) in August of 2014 8. Start patient on letrozole  from November, 2013. 9. Patient started on Herceptin in September 2013.    10Patient finished 12 months of Herceptin therapy on August, 2014  # 6. Status post left side prophylactic mastectomy.  #Late MAY 2018-RECURRENCE BREAST CA- ER positive/PR negative; ?? HER-2/neu- [biopsy- proven-mediastinal lymph node; in suff for her 2 testing].  [elevated Tumor marker- CT/PET- uptake in Right Mediastinal LN; Sternum [June 2018 EBUS- Dr.Kasa]   # March 10 2017- faslodex  +  Abema; OCT 5th CT-PR of mediastinal LN [consent]; Discontinued faslodex  sec to insurance issues [last DEC 2023]. JAN 2024- continue Abema + Anastrzole   #August 2020-left chest wall nodule biopsy benign  #August 2019- DVT-right upper extremity; Eliquis ; stop end of March 2021 [repeat ultrasound negative/patient preference]; # JAN 2022-Cirrhosis- ? On CT scan-FEB liver- Negative fro overt cirrhosis --------------------------------------------------------------- -    DIAGNOSIS: [ BREAST CANCER- ER/PR/HER2 NEU POS  STAGE:  4   ;GOALS: PALLIATIVE     Carcinoma of lower-outer quadrant of right breast in female, estrogen receptor positive (HCC)   INTERVAL HISTORY: Alone.  Ambulating independently.  Kimberly [name] 68 y.o.  female pleasant patient above history of metastatic ER PR positive HER-2/neu positive breast cancer currently on abema [M-F; no weekends]+ Anastrazole  is here for follow-up.  Patient is doing ok, she is getting ready to go to the beach for a couple of weeks.  No fever. Patient OFF abema; continues to take Anastrazole.   Denies achiness.  Has chronic knee pain right- worse recently.  Appetite is good. Fatigued.   Review of Systems  Constitutional:  Positive for malaise/fatigue. Negative for chills, diaphoresis, fever and weight loss.  HENT:  Negative for nosebleeds and sore throat.   Eyes:  Negative for double vision.  Respiratory:  Negative for cough, hemoptysis, sputum production, shortness of breath and wheezing.   Cardiovascular:  Negative for chest pain, palpitations, orthopnea and leg swelling.  Gastrointestinal:  Negative for abdominal pain, blood in stool, constipation, heartburn, melena, nausea and vomiting.  Musculoskeletal:  Positive for back pain and joint pain.  Neurological:  Negative for dizziness, tingling, focal weakness, weakness and headaches.  Endo/Heme/Allergies:  Does not bruise/bleed easily.  Psychiatric/Behavioral:  Negative for  depression. The patient is not nervous/anxious and does not have insomnia.       PAST MEDICAL HISTORY :  Past Medical History:  Diagnosis Date   Arthritis    Cancer of lower-outer quadrant of female breast (HCC) 08/06/2011   RIGHT, chemo and bilateral mastectomies.   High cholesterol    History of kidney stones    Hx of bilateral breast implants    Hypertension    Lung metastases 20118   chemo pills and faslidex shots.   PONV (postoperative nausea and vomiting)    Type II diabetes mellitus (HCC)    fasting 140-150   Vertigo    LAST WEEK   Vertigo 01/2017    PAST SURGICAL HISTORY :   Past Surgical History:  Procedure Laterality Date   BREAST BIOPSY  07/2011, 2020   right   BREAST RECONSTRUCTION  03/07/2012   Procedure: BREAST RECONSTRUCTION;  Surgeon: Alm Sick, MD;  Location: [facility] OR;  Service: Plastics;  Laterality: Left;  BREAST RECONSTRUCTION WITH PLACEMENT OF TISSUE EXPANDER TO LEFT BREAST   BREAST RECONSTRUCTION  02/06/2013   CESAREAN SECTION  [id number]   ENDOBRONCHIAL ULTRASOUND N/A 02/24/2017   Procedure: ENDOBRONCHIAL ULTRASOUND;  Surgeon: Isaiah Scrivener, MD;  Location: [facility] ORS;  Service: Cardiopulmonary;  Laterality: N/A;   LATISSIMUS FLAP TO BREAST Right 02/06/2013   Procedure: LATISSIMUS FLAP TO RIGHT BREAST WITH IMPLANT;  Surgeon: Alm Sick, MD;  Location: [facility] OR;  Service: Plastics;  Laterality: Right;   MASTECTOMY  03/07/12   modified right; total left   MODIFIED MASTECTOMY  03/07/2012   Procedure: MODIFIED MASTECTOMY;  Surgeon: Sherlean JINNY Laughter, MD;  Location: [facility] OR;  Service: General;  Laterality: Right;   PORTACATH PLACEMENT  07/2011   SCAR REVISION  03/30/2012   Procedure: SCAR REVISION;  Surgeon: Sherlean JINNY Laughter, MD;  Location: [facility];  Service: General;  Laterality: Right;  CLOSURE OF MASTECTOMY INCISION   WISDOM TOOTH EXTRACTION      FAMILY HISTORY :   Family History  Problem Relation Age of Onset   Breast cancer Mother     Diabetes Father     SOCIAL HISTORY:   Social History   Tobacco Use   Smoking status: Never   Smokeless tobacco: Never  Vaping Use   Vaping status: Never Used  Substance Use Topics   Alcohol  use: No   Drug use: No    ALLERGIES:  is allergic to sulfa antibiotics.  MEDICATIONS:  Current Outpatient Medications  Medication Sig Dispense Refill   abemaciclib  (VERZENIO ) 100 MG tablet Take 1 tablet (100 mg total) by mouth 2 (two) times daily. 56 tablet 1   anastrozole  (ARIMIDEX ) 1 MG tablet TAKE 1 TABLET BY MOUTH EVERY DAY 90 tablet 2   atorvastatin  (LIPITOR) 20 MG tablet Take 1 tablet (20 mg total) by mouth daily. 90 tablet 3   Azelastine-Fluticasone 137-50 MCG/ACT SUSP Place 1 spray into the nose in the morning and at bedtime.     carvedilol  (COREG ) 3.125 MG tablet Take 1 tablet (3.125 mg total) by mouth 2 (two) times daily with a meal. 120 tablet 3   Cholecalciferol  50 MCG (2000 UT) CAPS Take 2,000 Units by mouth daily.     clotrimazole -betamethasone  (LOTRISONE ) cream Apply topically daily as needed. 30 g 0   Cranberry-Vitamin C-Probiotic (AZO CRANBERRY PO) Take 1 Capful by mouth in the morning and at bedtime.     cyanocobalamin  (VITAMIN  B12) 1000 MCG tablet 1,000 mcg in the morning and at bedtime.     glipiZIDE  (GLUCOTROL  XL) 10 MG 24 hr tablet Take 1 tablet (10 mg total) by mouth 2 (two) times daily. 180 tablet 3   insulin  aspart (NOVOLOG  FLEXPEN) 100 UNIT/ML FlexPen Inject 4 Units into the skin 3 (three) times daily with meals. 15 mL 0   levothyroxine  (SYNTHROID ) 25 MCG tablet Take 1 tablet (25 mcg total) by mouth daily before breakfast. 90 tablet 1   loperamide  (IMODIUM  A-D) 2 MG tablet Take 2 mg by mouth 4 (four) times daily as needed for diarrhea or loose stools.     magnesium  oxide (MAG-OX) 400 MG tablet Take 1 tablet by mouth 2 (two) times daily.     Melatonin 10 MG TABS Take 1 tablet by mouth at bedtime as needed.     montelukast  (SINGULAIR ) 10 MG tablet Take 1 tablet (10 mg  total) by mouth at bedtime. 30 tablet 3   Olopatadine  HCl 0.2 % SOLN Apply 1 drop to eye daily as needed.     ondansetron  (ZOFRAN -ODT) 4 MG disintegrating tablet Take 1 tablet (4 mg total) by mouth every 8 (eight) hours as needed for nausea or vomiting. 30 tablet 3   potassium chloride  (KLOR-CON  M) 10 MEQ tablet Take 1 tablet (10 mEq total) by mouth daily. 7 tablet 0   promethazine  (PHENERGAN ) 25 MG tablet Take 1 tablet (25 mg total) by mouth every 8 (eight) hours as needed for nausea or vomiting. 15 tablet 0   traMADol  (ULTRAM ) 50 MG tablet Take 1 tablet (50 mg total) by mouth every 8 (eight) hours as needed. 60 tablet 0   No current facility-administered medications for this visit.    PHYSICAL EXAMINATION: ECOG PERFORMANCE STATUS: 1 - Symptomatic but completely ambulatory  Pulse 91   Temp 97.8 F (36.6 C) (Tympanic)   Resp 18   Ht 5' 2 (1.575 m)   Wt 151 lb (68.5 kg)   SpO2 98%   BMI 27.62 kg/m   Filed Weights   02/24/24 1432  Weight: 151 lb (68.5 kg)      Physical Exam Constitutional:      Comments: She is alone.   HENT:     Head: Normocephalic and atraumatic.     Mouth/Throat:     Pharynx: No oropharyngeal exudate.   Eyes:     Pupils: Pupils are equal, round, and reactive to light.    Cardiovascular:     Rate and Rhythm: Normal rate and regular rhythm.  Pulmonary:     Effort: Pulmonary effort is normal. No respiratory distress.     Breath sounds: Normal breath sounds. No wheezing.  Abdominal:     General: Bowel sounds are normal. There is no distension.     Palpations: Abdomen is soft. There is no mass.     Tenderness: There is no abdominal tenderness. There is no guarding or rebound.   Musculoskeletal:        General: No tenderness. Normal range of motion.     Cervical back: Normal range of motion and neck supple.     Comments: Approximately 1 cm hard nodule felt in the anterior chest wall/left parasternal.   Skin:    General: Skin is warm.    Neurological:     Mental Status: She is alert and oriented to person, place, and time.   Psychiatric:        Mood and Affect: Affect normal.    LABORATORY DATA:  I have reviewed the data as listed    Component Value Date/Time   NA 133 (L) 02/24/2024 1412   NA 135 11/28/2014 1401   K 3.7 02/24/2024 1412   K 4.7 11/28/2014 1401   CL 102 02/24/2024 1412   CL 99 (L) 11/28/2014 1401   CO2 23 02/24/2024 1412   CO2 28 11/28/2014 1401   GLUCOSE 261 (H) 02/24/2024 1412   GLUCOSE 215 (H) 11/28/2014 1401   BUN 26 (H) 02/24/2024 1412   BUN 16 11/28/2014 1401   CREATININE 0.97 02/24/2024 1412   CREATININE 0.96 11/28/2014 1401   CALCIUM  9.6 02/24/2024 1412   CALCIUM  9.9 11/28/2014 1401   PROT 6.9 02/24/2024 1412   PROT 7.6 11/28/2014 1401   ALBUMIN 3.5 02/24/2024 1412   ALBUMIN 4.5 11/28/2014 1401   AST 35 02/24/2024 1412   ALT 26 02/24/2024 1412   ALT 28 11/28/2014 1401   ALKPHOS 103 02/24/2024 1412   ALKPHOS 78 11/28/2014 1401   BILITOT 0.9 02/24/2024 1412   GFRNONAA >60 02/24/2024 1412   GFRNONAA >60 11/28/2014 1401   GFRAA >60 05/30/2020 1022   GFRAA >60 11/28/2014 1401    No results found for: SPEP, UPEP  Lab Results  Component Value Date   WBC 4.9 02/24/2024   NEUTROABS 2.8 02/24/2024   HGB 10.8 (L) 02/24/2024   HCT 31.8 (L) 02/24/2024   MCV 97.5 02/24/2024   PLT 251 02/24/2024      Chemistry      Component Value Date/Time   NA 133 (L) 02/24/2024 1412   NA 135 11/28/2014 1401   K 3.7 02/24/2024 1412   K 4.7 11/28/2014 1401   CL 102 02/24/2024 1412   CL 99 (L) 11/28/2014 1401   CO2 23 02/24/2024 1412   CO2 28 11/28/2014 1401   BUN 26 (H) 02/24/2024 1412   BUN 16 11/28/2014 1401   CREATININE 0.97 02/24/2024 1412   CREATININE 0.96 11/28/2014 1401      Component Value Date/Time   CALCIUM  9.6 02/24/2024 1412   CALCIUM  9.9 11/28/2014 1401   ALKPHOS 103 02/24/2024 1412   ALKPHOS 78 11/28/2014 1401   AST 35 02/24/2024 1412   ALT 26 02/24/2024 1412    ALT 28 11/28/2014 1401   BILITOT 0.9 02/24/2024 1412       RADIOGRAPHIC STUDIES: I have personally reviewed the radiological images as listed and agreed with the findings in the report. No results found.   ASSESSMENT & PLAN:  Carcinoma of lower-outer quadrant of right breast in female, estrogen receptor positive (HCC)  # Recurrent breast cancer-ER positive PR negative ? HER-2/neu-currently on abema+ Anastrozole . FEB 2025- Stable postsurgical changes from breast reconstruction and right axillary dissection. Stable areas of scarring and fibrotic changes seen of the lungs. No developing new mass lesion, fluid collection or nodal enlargement.  # Continue Anastrazole [JAN Mid 2024-]-given improvement of LFTs/and neutropenia-okay to restart Verzenio .  Discussed regarding starting at lower dose 50 mg-however patient wants to try the 100 mg twice daily Monday through Friday-[going to the beach].  If continued LFT elevation/neutropenia-lower the dose to 50 twice daily next visit.  Plan repeat imaging again in 4 months or so.  # LFTs- elevated- x2-3; Normal bilirubin- [possibly Tylenol  versus Verzenio ]-resolved.  Monitor closely on Verzinio-   # Right Knee pain-  [Emerge Otho]; will start tramadol ; HOLD Tylenol .   # CKD stage II- stable.   # Seasonal allergies-continue OTC antihistamines- stable.   # Hypomagnesia: 1.4- on OTC mag-  stable.  # DM- s/p endocrinology evaluation; with diet and medications [trucilicity; Novolog ]-    stable.  # recurrent UTI/ ? Suprapubic tenderness- S/p eval with urology.  Monitor closely on Abema/with neutropenia- s/p evaluation with Tinnie Dawn re: vaginal atrophy today-    stable.  # intermittent Hypercalcemia: Ca 10.4- monitor for now. FEB Vit D - 90; HOLD off calcium  and Vit D-   stable.  # Low B 12 [PCP]- on B12 PO.   stable.  # Flu shot today- 06/02/23.   # IV access; PIV.   Q21m; :PREFERS AM appt- NOT Fridays  Mychart- # DISPOSITION: PREFERS AM  appt- NOT Fridays # Follow-up  1 month--MD-  labs CBC CMP; magnesium ; CA 27-29 - Dr. KATHEE    Orders Placed This Encounter  Procedures   CBC with Differential (Cancer Center Only)    Standing Status:   Future    Expected Date:   03/29/2024    Expiration Date:   06/27/2024   CMP (Cancer Center only)    Standing Status:   Future    Expected Date:   03/29/2024    Expiration Date:   06/27/2024   Magnesium     Standing Status:   Future    Expected Date:   03/29/2024    Expiration Date:   06/27/2024   Cancer antigen 27.29    Standing Status:   Future    Expected Date:   03/29/2024    Expiration Date:   06/27/2024   All questions were answered. The patient knows to call the clinic with any problems, questions or concerns.      Cindy JONELLE Joe, MD 02/24/2024 3:15 PM

## 2024-02-24 NOTE — Progress Notes (Signed)
 Patient is doing ok, she is getting ready to go to the beach for a couple of weeks.

## 2024-02-25 LAB — CANCER ANTIGEN 27.29: CA 27.29: 31.3 U/mL (ref 0.0–38.6)

## 2024-03-12 ENCOUNTER — Other Ambulatory Visit: Payer: Self-pay

## 2024-03-12 ENCOUNTER — Other Ambulatory Visit (HOSPITAL_COMMUNITY): Payer: Self-pay

## 2024-03-12 ENCOUNTER — Encounter (INDEPENDENT_AMBULATORY_CARE_PROVIDER_SITE_OTHER): Payer: Self-pay

## 2024-03-12 NOTE — Progress Notes (Signed)
 Specialty Pharmacy Refill Coordination Note  Kimberly [name] is a 68 y.o. female contacted today regarding refills of specialty medication(s) Abemaciclib  (VERZENIO )   Patient requested (Patient-Rptd) Delivery   Delivery date: 03/14/24   Verified address: (Patient-Rptd) [address] [place] [address]   Medication will be filled on 03/13/24.

## 2024-03-28 ENCOUNTER — Inpatient Hospital Stay: Attending: Internal Medicine

## 2024-03-28 ENCOUNTER — Inpatient Hospital Stay (HOSPITAL_BASED_OUTPATIENT_CLINIC_OR_DEPARTMENT_OTHER): Admitting: Internal Medicine

## 2024-03-28 ENCOUNTER — Encounter: Payer: Self-pay | Admitting: Internal Medicine

## 2024-03-28 ENCOUNTER — Other Ambulatory Visit: Payer: Self-pay

## 2024-03-28 VITALS — BP 114/62 | HR 82 | Temp 96.6°F | Resp 18 | Ht 62.0 in | Wt 149.1 lb

## 2024-03-28 DIAGNOSIS — Z170 Estrogen receptor positive status [ER+]: Secondary | ICD-10-CM

## 2024-03-28 DIAGNOSIS — Z1731 Human epidermal growth factor receptor 2 positive status: Secondary | ICD-10-CM | POA: Diagnosis not present

## 2024-03-28 DIAGNOSIS — C50511 Malignant neoplasm of lower-outer quadrant of right female breast: Secondary | ICD-10-CM | POA: Insufficient documentation

## 2024-03-28 DIAGNOSIS — Z86718 Personal history of other venous thrombosis and embolism: Secondary | ICD-10-CM | POA: Insufficient documentation

## 2024-03-28 DIAGNOSIS — Z1721 Progesterone receptor positive status: Secondary | ICD-10-CM | POA: Diagnosis not present

## 2024-03-28 DIAGNOSIS — C7800 Secondary malignant neoplasm of unspecified lung: Secondary | ICD-10-CM | POA: Insufficient documentation

## 2024-03-28 DIAGNOSIS — Z79811 Long term (current) use of aromatase inhibitors: Secondary | ICD-10-CM | POA: Diagnosis not present

## 2024-03-28 LAB — CMP (CANCER CENTER ONLY)
ALT: 23 U/L (ref 0–44)
AST: 30 U/L (ref 15–41)
Albumin: 3.7 g/dL (ref 3.5–5.0)
Alkaline Phosphatase: 106 U/L (ref 38–126)
Anion gap: 8 (ref 5–15)
BUN: 20 mg/dL (ref 8–23)
CO2: 23 mmol/L (ref 22–32)
Calcium: 10.8 mg/dL — ABNORMAL HIGH (ref 8.9–10.3)
Chloride: 103 mmol/L (ref 98–111)
Creatinine: 1.23 mg/dL — ABNORMAL HIGH (ref 0.44–1.00)
GFR, Estimated: 48 mL/min — ABNORMAL LOW (ref >60)
Glucose, Bld: 206 mg/dL — ABNORMAL HIGH (ref 70–99)
Potassium: 4 mmol/L (ref 3.5–5.1)
Sodium: 134 mmol/L — ABNORMAL LOW (ref 135–145)
Total Bilirubin: 0.7 mg/dL (ref 0.0–1.2)
Total Protein: 7.3 g/dL (ref 6.5–8.1)

## 2024-03-28 LAB — CBC WITH DIFFERENTIAL (CANCER CENTER ONLY)
Abs Immature Granulocytes: 0.01 K/uL (ref 0.00–0.07)
Basophils Absolute: 0 K/uL (ref 0.0–0.1)
Basophils Relative: 1 %
Eosinophils Absolute: 0.1 K/uL (ref 0.0–0.5)
Eosinophils Relative: 2 %
HCT: 35.6 % — ABNORMAL LOW (ref 36.0–46.0)
Hemoglobin: 12.1 g/dL (ref 12.0–15.0)
Immature Granulocytes: 0 %
Lymphocytes Relative: 39 %
Lymphs Abs: 1.3 K/uL (ref 0.7–4.0)
MCH: 32.4 pg (ref 26.0–34.0)
MCHC: 34 g/dL (ref 30.0–36.0)
MCV: 95.4 fL (ref 80.0–100.0)
Monocytes Absolute: 0.2 K/uL (ref 0.1–1.0)
Monocytes Relative: 7 %
Neutro Abs: 1.6 K/uL — ABNORMAL LOW (ref 1.7–7.7)
Neutrophils Relative %: 51 %
Platelet Count: 180 K/uL (ref 150–400)
RBC: 3.73 MIL/uL — ABNORMAL LOW (ref 3.87–5.11)
RDW: 13.2 % (ref 11.5–15.5)
WBC Count: 3.2 K/uL — ABNORMAL LOW (ref 4.0–10.5)
nRBC: 0 % (ref 0.0–0.2)

## 2024-03-28 LAB — MAGNESIUM: Magnesium: 2.1 mg/dL (ref 1.7–2.4)

## 2024-03-28 MED ORDER — ABEMACICLIB 100 MG PO TABS
100.0000 mg | ORAL_TABLET | Freq: Two times a day (BID) | ORAL | 1 refills | Status: DC
  Filled 2024-03-28 – 2024-03-30 (×2): qty 56, 28d supply, fill #0
  Filled 2024-05-01: qty 56, 28d supply, fill #1

## 2024-03-28 NOTE — Progress Notes (Signed)
 Patient reports she's doing okay with no new or acute concerns today.

## 2024-03-28 NOTE — Progress Notes (Signed)
 [name]  Patient Care Team: Steva Clotilda DEL, NP as PCP - General (Family Medicine) Merrilyn Handler, MD (Inactive) (General Surgery) Curlene Agent, MD (Obstetrics and Gynecology) Lenn Aran, MD Wilder Pink, MD (Unknown Physician Specialty) Rennie Cindy SAUNDERS, MD as Consulting Physician (Oncology)   Cancer Staging  Carcinoma of lower-outer quadrant of right breast in female, estrogen receptor positive [facility]) Staging form: Breast, AJCC 7th Edition - Pathologic: ypT1c,ypN2a, MX - Signed by Merrilyn Handler PARAS, MD on 03/10/2012 Cancer stage: ypT1c,ypN2a, MX    Oncology History Overview Note  # DEC 2012- RIGHT BREAST CA T2 N2 M0 tumor from biopsy.  Estrogen receptor positive, Progesterone receptor positive.  Current receptor negative by FISH 2. Neoadjuvant chemotherapy started in December of 28 with Cytoxan Adriamycin 3. Started on Taxol weekly chemotherapy. 4. Patient finished 12  cycles of Taxol chemotherapy in May of 2013.     5. Status post right modified radical mastectomy [Dr.Bowers; [place]] June of 2013, ypT1c  yp N2  MO. started also on Lerazole    7. Radiation therapy to the right breast (September of 2013).  Lymph node was positive for HER-2 receptor gene amplification of 2.22.  Will proceed with Herceptin treatment starting in September of 2013.   8.Patient has finished Herceptin (maintenance therapy) in August of 2014 8. Start patient on letrozole  from November, 2013. 9. Patient started on Herceptin in September 2013.    10Patient finished 12 months of Herceptin therapy on August, 2014  # 6. Status post left side prophylactic mastectomy.  #Late MAY 2018-RECURRENCE BREAST CA- ER positive/PR negative; ?? HER-2/neu- [biopsy- proven-mediastinal lymph node; in suff for her 2 testing].  [elevated Tumor marker- CT/PET- uptake in Right Mediastinal LN; Sternum [June 2018 EBUS- Dr.Kasa]   # March 10 2017- faslodex  + Abema; OCT 5th  CT-PR of mediastinal LN [consent]; Discontinued faslodex  sec to insurance issues [last DEC 2023]. JAN 2024- continue Abema + Anastrzole   #August 2020-left chest wall nodule biopsy benign  #August 2019- DVT-right upper extremity; Eliquis ; stop end of March 2021 [repeat ultrasound negative/patient preference]; # JAN 2022-Cirrhosis- ? On CT scan-FEB liver- Negative fro overt cirrhosis --------------------------------------------------------------- -    DIAGNOSIS: [ BREAST CANCER- ER/PR/HER2 NEU POS  STAGE:  4   ;GOALS: PALLIATIVE     Carcinoma of lower-outer quadrant of right breast in female, estrogen receptor positive (HCC)   INTERVAL HISTORY: Alone.  Ambulating independently.  Kimberly [name] 68 y.o.  female pleasant patient above history of metastatic ER PR positive HER-2/neu positive breast cancer currently on abema [M-F; no weekends]+ Anastrazole  is here for follow-up.  Patient feels well. No fever. She continues to take Anastrazole;and abemaciclib . No fevers or chills. No UTIs.   Denies achiness.  Has chronic knee pain right- worse recently.  Appetite is good. Fatigued.   Review of Systems  Constitutional:  Positive for malaise/fatigue. Negative for chills, diaphoresis, fever and weight loss.  HENT:  Negative for nosebleeds and sore throat.   Eyes:  Negative for double vision.  Respiratory:  Negative for cough, hemoptysis, sputum production, shortness of breath and wheezing.   Cardiovascular:  Negative for chest pain, palpitations, orthopnea and leg swelling.  Gastrointestinal:  Negative for abdominal pain, blood in stool, constipation, heartburn, melena, nausea and vomiting.  Musculoskeletal:  Positive for back pain and joint pain.  Neurological:  Negative for dizziness, tingling, focal weakness, weakness and headaches.  Endo/Heme/Allergies:  Does not bruise/bleed easily.  Psychiatric/Behavioral:  Negative for depression. The  patient is not nervous/anxious and does  not have insomnia.       PAST MEDICAL HISTORY :  Past Medical History:  Diagnosis Date   Arthritis    Cancer of lower-outer quadrant of female breast (HCC) 08/06/2011   RIGHT, chemo and bilateral mastectomies.   High cholesterol    History of kidney stones    Hx of bilateral breast implants    Hypertension    Lung metastases 20118   chemo pills and faslidex shots.   PONV (postoperative nausea and vomiting)    Type II diabetes mellitus (HCC)    fasting 140-150   Vertigo    LAST WEEK   Vertigo 01/2017    PAST SURGICAL HISTORY :   Past Surgical History:  Procedure Laterality Date   BREAST BIOPSY  07/2011, 2020   right   BREAST RECONSTRUCTION  03/07/2012   Procedure: BREAST RECONSTRUCTION;  Surgeon: Alm Sick, MD;  Location: [facility] OR;  Service: Plastics;  Laterality: Left;  BREAST RECONSTRUCTION WITH PLACEMENT OF TISSUE EXPANDER TO LEFT BREAST   BREAST RECONSTRUCTION  02/06/2013   CESAREAN SECTION  [id number]   ENDOBRONCHIAL ULTRASOUND N/A 02/24/2017   Procedure: ENDOBRONCHIAL ULTRASOUND;  Surgeon: Isaiah Scrivener, MD;  Location: [facility] ORS;  Service: Cardiopulmonary;  Laterality: N/A;   LATISSIMUS FLAP TO BREAST Right 02/06/2013   Procedure: LATISSIMUS FLAP TO RIGHT BREAST WITH IMPLANT;  Surgeon: Alm Sick, MD;  [facility];  Service: Plastics;  Laterality: Right;   MASTECTOMY  03/07/12   modified right; total left   MODIFIED MASTECTOMY  03/07/2012   Procedure: MODIFIED MASTECTOMY;  Surgeon: Sherlean JINNY Laughter, MD;  Location: [facility] OR;  Service: General;  Laterality: Right;   PORTACATH PLACEMENT  07/2011   SCAR REVISION  03/30/2012   Procedure: SCAR REVISION;  Surgeon: Sherlean JINNY Laughter, MD;  Location: [facility];  Service: General;  Laterality: Right;  CLOSURE OF MASTECTOMY INCISION   WISDOM TOOTH EXTRACTION      FAMILY HISTORY :   Family History  Problem Relation Age of Onset   Breast cancer Mother    Diabetes Father     SOCIAL HISTORY:   Social History    Tobacco Use   Smoking status: Never   Smokeless tobacco: Never  Vaping Use   Vaping status: Never Used  Substance Use Topics   Alcohol  use: No   Drug use: No    ALLERGIES:  is allergic to sulfa antibiotics.  MEDICATIONS:  Current Outpatient Medications  Medication Sig Dispense Refill   abemaciclib  (VERZENIO ) 100 MG tablet Take 1 tablet (100 mg total) by mouth 2 (two) times daily. 56 tablet 1   anastrozole  (ARIMIDEX ) 1 MG tablet TAKE 1 TABLET BY MOUTH EVERY DAY 90 tablet 2   atorvastatin  (LIPITOR) 20 MG tablet Take 1 tablet (20 mg total) by mouth daily. 90 tablet 3   Azelastine-Fluticasone 137-50 MCG/ACT SUSP Place 1 spray into the nose in the morning and at bedtime.     carvedilol  (COREG ) 3.125 MG tablet Take 1 tablet (3.125 mg total) by mouth 2 (two) times daily with a meal. 120 tablet 3   Cholecalciferol  50 MCG (2000 UT) CAPS Take 2,000 Units by mouth daily.     clotrimazole -betamethasone  (LOTRISONE ) cream Apply topically daily as needed. 30 g 0   Cranberry-Vitamin C-Probiotic (AZO CRANBERRY PO) Take 1 Capful by mouth in the morning and at bedtime.     cyanocobalamin  (VITAMIN B12) 1000 MCG tablet 1,000 mcg in the morning and at bedtime.  FARXIGA 10 MG TABS tablet Take 10 mg by mouth daily.     glipiZIDE  (GLUCOTROL  XL) 10 MG 24 hr tablet Take 1 tablet (10 mg total) by mouth 2 (two) times daily. 180 tablet 3   insulin  aspart (NOVOLOG  FLEXPEN) 100 UNIT/ML FlexPen Inject 4 Units into the skin 3 (three) times daily with meals. 15 mL 0   levothyroxine  (SYNTHROID ) 25 MCG tablet Take 1 tablet (25 mcg total) by mouth daily before breakfast. 90 tablet 1   loperamide  (IMODIUM  A-D) 2 MG tablet Take 2 mg by mouth 4 (four) times daily as needed for diarrhea or loose stools.     magnesium  oxide (MAG-OX) 400 MG tablet Take 1 tablet by mouth 2 (two) times daily.     Melatonin 10 MG TABS Take 1 tablet by mouth at bedtime as needed.     montelukast  (SINGULAIR ) 10 MG tablet Take 1 tablet (10 mg  total) by mouth at bedtime. 30 tablet 3   Olopatadine  HCl 0.2 % SOLN Apply 1 drop to eye daily as needed.     ondansetron  (ZOFRAN -ODT) 4 MG disintegrating tablet Take 1 tablet (4 mg total) by mouth every 8 (eight) hours as needed for nausea or vomiting. 30 tablet 3   potassium chloride  (KLOR-CON  M) 10 MEQ tablet Take 1 tablet (10 mEq total) by mouth daily. 7 tablet 0   promethazine  (PHENERGAN ) 25 MG tablet Take 1 tablet (25 mg total) by mouth every 8 (eight) hours as needed for nausea or vomiting. 15 tablet 0   traMADol  (ULTRAM ) 50 MG tablet Take 1 tablet (50 mg total) by mouth every 8 (eight) hours as needed. 60 tablet 0   No current facility-administered medications for this visit.    PHYSICAL EXAMINATION: ECOG PERFORMANCE STATUS: 1 - Symptomatic but completely ambulatory  BP 114/62 (BP Location: Left Arm, Patient Position: Sitting)   Pulse 82   Temp (!) 96.6 F (35.9 C) (Tympanic)   Resp 18   Ht 5' 2 (1.575 m)   Wt 149 lb 1.6 oz (67.6 kg)   SpO2 100%   BMI 27.27 kg/m   Filed Weights   03/28/24 0942  Weight: 149 lb 1.6 oz (67.6 kg)      Physical Exam Constitutional:      Comments: She is alone.   HENT:     Head: Normocephalic and atraumatic.     Mouth/Throat:     Pharynx: No oropharyngeal exudate.  Eyes:     Pupils: Pupils are equal, round, and reactive to light.  Cardiovascular:     Rate and Rhythm: Normal rate and regular rhythm.  Pulmonary:     Effort: Pulmonary effort is normal. No respiratory distress.     Breath sounds: Normal breath sounds. No wheezing.  Abdominal:     General: Bowel sounds are normal. There is no distension.     Palpations: Abdomen is soft. There is no mass.     Tenderness: There is no abdominal tenderness. There is no guarding or rebound.  Musculoskeletal:        General: No tenderness. Normal range of motion.     Cervical back: Normal range of motion and neck supple.     Comments: Approximately 1 cm hard nodule felt in the anterior  chest wall/left parasternal.  Skin:    General: Skin is warm.  Neurological:     Mental Status: She is alert and oriented to person, place, and time.  Psychiatric:        Mood and Affect:  Affect normal.    LABORATORY DATA:  I have reviewed the data as listed    Component Value Date/Time   NA 134 (L) 03/28/2024 0929   NA 135 11/28/2014 1401   K 4.0 03/28/2024 0929   K 4.7 11/28/2014 1401   CL 103 03/28/2024 0929   CL 99 (L) 11/28/2014 1401   CO2 23 03/28/2024 0929   CO2 28 11/28/2014 1401   GLUCOSE 206 (H) 03/28/2024 0929   GLUCOSE 215 (H) 11/28/2014 1401   BUN 20 03/28/2024 0929   BUN 16 11/28/2014 1401   CREATININE 1.23 (H) 03/28/2024 0929   CREATININE 0.96 11/28/2014 1401   CALCIUM  10.8 (H) 03/28/2024 0929   CALCIUM  9.9 11/28/2014 1401   PROT 7.3 03/28/2024 0929   PROT 7.6 11/28/2014 1401   ALBUMIN 3.7 03/28/2024 0929   ALBUMIN 4.5 11/28/2014 1401   AST 30 03/28/2024 0929   ALT 23 03/28/2024 0929   ALT 28 11/28/2014 1401   ALKPHOS 106 03/28/2024 0929   ALKPHOS 78 11/28/2014 1401   BILITOT 0.7 03/28/2024 0929   GFRNONAA 48 (L) 03/28/2024 0929   GFRNONAA >60 11/28/2014 1401   GFRAA >60 05/30/2020 1022   GFRAA >60 11/28/2014 1401    No results found for: SPEP, UPEP  Lab Results  Component Value Date   WBC 3.2 (L) 03/28/2024   NEUTROABS 1.6 (L) 03/28/2024   HGB 12.1 03/28/2024   HCT 35.6 (L) 03/28/2024   MCV 95.4 03/28/2024   PLT 180 03/28/2024      Chemistry      Component Value Date/Time   NA 134 (L) 03/28/2024 0929   NA 135 11/28/2014 1401   K 4.0 03/28/2024 0929   K 4.7 11/28/2014 1401   CL 103 03/28/2024 0929   CL 99 (L) 11/28/2014 1401   CO2 23 03/28/2024 0929   CO2 28 11/28/2014 1401   BUN 20 03/28/2024 0929   BUN 16 11/28/2014 1401   CREATININE 1.23 (H) 03/28/2024 0929   CREATININE 0.96 11/28/2014 1401      Component Value Date/Time   CALCIUM  10.8 (H) 03/28/2024 0929   CALCIUM  9.9 11/28/2014 1401   ALKPHOS 106 03/28/2024 0929    ALKPHOS 78 11/28/2014 1401   AST 30 03/28/2024 0929   ALT 23 03/28/2024 0929   ALT 28 11/28/2014 1401   BILITOT 0.7 03/28/2024 0929       RADIOGRAPHIC STUDIES: I have personally reviewed the radiological images as listed and agreed with the findings in the report. No results found.   ASSESSMENT & PLAN:  Carcinoma of lower-outer quadrant of right breast in female, estrogen receptor positive (HCC)  # Recurrent breast cancer-ER positive PR negative ? HER-2/neu-currently on abema+ Anastrozole .   # FEB 2025- Stable postsurgical changes from breast reconstruction and right axillary dissection. Stable areas of scarring and fibrotic changes seen of the lungs. No developing new mass lesion, fluid collection or nodal enlargement.  # Continue Anastrazole [JAN Mid 2024-]-given improvement of LFTs/and neutropenia-- CONTINUE Verzenio - 100 mg twice daily Monday through Friday- if on going issues with infection/severe neutropenia- recommend lower dose 50 mg- BID. Will order scan at next visit.  # Intermittent Hypercalcemia- HOLD off ca+vit D- check PTH;  # LFTs- elevated- x2-3; Normal bilirubin- [possibly Tylenol  versus Verzenio ]-resolved.  Monitor closely on Verzinio-   # Right Knee pain-  [Emerge Otho]; will start tramadol ; HOLD Tylenol .   # CKD stage II- stable.   # Hypomagnesia: 1.4- on OTC mag-   stable.  # DM-  s/p endocrinology evaluation; with diet and medications [trucilicity; Novolog ]-    stable.  # recurrent UTI/ ? Suprapubic tenderness- S/p eval with urology.  Monitor closely on Abema/with neutropenia- s/p evaluation with Tinnie Dawn re: vaginal atrophy today-    stable.  # Low B 12 [PCP]- on B12 PO.   stable.  # Flu shot today- 06/02/23.   # IV access; PIV.   Q70m; :PREFERS AM appt- NOT Fridays  [facility]- # DISPOSITION: PREFERS AM appt- NOT Fridays # Follow-up  1 month--MD-  labs CBC CMP; magnesium ; CA 27-29 - Dr. KATHEE    Orders Placed This Encounter  Procedures   CBC with  Differential (Cancer Center Only)    Standing Status:   Future    Expected Date:   05/02/2024    Expiration Date:   07/31/2024   CMP (Cancer Center only)    Standing Status:   Future    Expected Date:   05/02/2024    Expiration Date:   07/31/2024   Magnesium     Standing Status:   Future    Expected Date:   05/02/2024    Expiration Date:   07/31/2024   Cancer antigen 27.29    Standing Status:   Future    Expected Date:   05/02/2024    Expiration Date:   07/31/2024   All questions were answered. The patient knows to call the clinic with any problems, questions or concerns.      Cindy JONELLE Joe, MD 03/28/2024 10:33 AM

## 2024-03-28 NOTE — Assessment & Plan Note (Addendum)
#   Recurrent breast cancer-ER positive PR negative ? HER-2/neu-currently on abema+ Anastrozole .   # FEB 2025- Stable postsurgical changes from breast reconstruction and right axillary dissection. Stable areas of scarring and fibrotic changes seen of the lungs. No developing new mass lesion, fluid collection or nodal enlargement.  # Continue Anastrazole [JAN Mid 2024-]-given improvement of LFTs/and neutropenia-- CONTINUE Verzenio - 100 mg twice daily Monday through Friday- if on going issues with infection/severe neutropenia- recommend lower dose 50 mg- BID. Will order scan at next visit.  # Intermittent Hypercalcemia- HOLD off ca+vit D- check PTH;  # LFTs- elevated- x2-3; Normal bilirubin- [possibly Tylenol  versus Verzenio ]-resolved.  Monitor closely on Verzinio-   # Right Knee pain-  [Emerge Otho]; will start tramadol ; HOLD Tylenol .   # CKD stage II- stable.   # Hypomagnesia: 1.4- on OTC mag-   stable.  # DM- s/p endocrinology evaluation; with diet and medications [trucilicity; Novolog ]-    stable.  # recurrent UTI/ ? Suprapubic tenderness- S/p eval with urology.  Monitor closely on Abema/with neutropenia- s/p evaluation with Tinnie Dawn re: vaginal atrophy today-    stable.  # Low B 12 [PCP]- on B12 PO.   stable.  # Flu shot today- 06/02/23.   # IV access; PIV.   Q30m; :PREFERS AM appt- NOT Fridays  [facility]- # DISPOSITION: PREFERS AM appt- NOT Fridays # Follow-up  1 month--MD-  labs CBC CMP; magnesium ; CA 27-29 - Dr. KATHEE

## 2024-03-29 LAB — CANCER ANTIGEN 27.29: CA 27.29: 27.3 U/mL (ref 0.0–38.6)

## 2024-03-30 ENCOUNTER — Other Ambulatory Visit: Payer: Self-pay

## 2024-03-30 NOTE — Progress Notes (Signed)
 Specialty Pharmacy Refill Coordination Note  Kimberly [name] is a 68 y.o. female contacted today regarding refills of specialty medication(s) Abemaciclib  (VERZENIO )   Patient requested Delivery   Delivery date: 04/05/24   Verified address: [address] [place] [address]   Medication will be filled on 04/04/24.

## 2024-03-30 NOTE — Progress Notes (Signed)
 Specialty Pharmacy Ongoing Clinical Assessment Note  Kimberly [name] is a 68 y.o. female who is being followed by the specialty pharmacy service for RxSp Oncology   Patient's specialty medication(s) reviewed today: Abemaciclib  (VERZENIO )   Missed doses in the last 4 weeks: 0   Patient/Caregiver did not have any additional questions or concerns.   Therapeutic benefit summary: Patient is achieving benefit   Adverse events/side effects summary: No adverse events/side effects   Patient's therapy is appropriate to: Continue    Goals Addressed             This Visit's Progress    Slow Disease Progression   On track    Patient is on track. Patient will maintain adherence. Patient to have updated scan prior to visit in September.         Follow up: 6 months  [facility]

## 2024-05-01 ENCOUNTER — Inpatient Hospital Stay (HOSPITAL_BASED_OUTPATIENT_CLINIC_OR_DEPARTMENT_OTHER): Admitting: Internal Medicine

## 2024-05-01 ENCOUNTER — Other Ambulatory Visit: Payer: Self-pay

## 2024-05-01 ENCOUNTER — Inpatient Hospital Stay: Attending: Internal Medicine

## 2024-05-01 ENCOUNTER — Encounter: Payer: Self-pay | Admitting: Internal Medicine

## 2024-05-01 VITALS — BP 124/76 | HR 92 | Temp 97.5°F | Resp 16 | Ht 62.0 in | Wt 151.8 lb

## 2024-05-01 DIAGNOSIS — I129 Hypertensive chronic kidney disease with stage 1 through stage 4 chronic kidney disease, or unspecified chronic kidney disease: Secondary | ICD-10-CM | POA: Diagnosis not present

## 2024-05-01 DIAGNOSIS — Z794 Long term (current) use of insulin: Secondary | ICD-10-CM | POA: Diagnosis not present

## 2024-05-01 DIAGNOSIS — Z79811 Long term (current) use of aromatase inhibitors: Secondary | ICD-10-CM | POA: Diagnosis not present

## 2024-05-01 DIAGNOSIS — Z79899 Other long term (current) drug therapy: Secondary | ICD-10-CM | POA: Insufficient documentation

## 2024-05-01 DIAGNOSIS — N182 Chronic kidney disease, stage 2 (mild): Secondary | ICD-10-CM | POA: Insufficient documentation

## 2024-05-01 DIAGNOSIS — C50511 Malignant neoplasm of lower-outer quadrant of right female breast: Secondary | ICD-10-CM | POA: Insufficient documentation

## 2024-05-01 DIAGNOSIS — C7800 Secondary malignant neoplasm of unspecified lung: Secondary | ICD-10-CM | POA: Insufficient documentation

## 2024-05-01 DIAGNOSIS — Z9013 Acquired absence of bilateral breasts and nipples: Secondary | ICD-10-CM | POA: Diagnosis not present

## 2024-05-01 DIAGNOSIS — Z7984 Long term (current) use of oral hypoglycemic drugs: Secondary | ICD-10-CM | POA: Diagnosis not present

## 2024-05-01 DIAGNOSIS — Z803 Family history of malignant neoplasm of breast: Secondary | ICD-10-CM | POA: Insufficient documentation

## 2024-05-01 DIAGNOSIS — Z170 Estrogen receptor positive status [ER+]: Secondary | ICD-10-CM | POA: Insufficient documentation

## 2024-05-01 LAB — CBC WITH DIFFERENTIAL (CANCER CENTER ONLY)
Abs Immature Granulocytes: 0 K/uL (ref 0.00–0.07)
Basophils Absolute: 0.1 K/uL (ref 0.0–0.1)
Basophils Relative: 3 %
Eosinophils Absolute: 0.1 K/uL (ref 0.0–0.5)
Eosinophils Relative: 2 %
HCT: 35.7 % — ABNORMAL LOW (ref 36.0–46.0)
Hemoglobin: 11.9 g/dL — ABNORMAL LOW (ref 12.0–15.0)
Immature Granulocytes: 0 %
Lymphocytes Relative: 48 %
Lymphs Abs: 1.6 K/uL (ref 0.7–4.0)
MCH: 32.4 pg (ref 26.0–34.0)
MCHC: 33.3 g/dL (ref 30.0–36.0)
MCV: 97.3 fL (ref 80.0–100.0)
Monocytes Absolute: 0.2 K/uL (ref 0.1–1.0)
Monocytes Relative: 6 %
Neutro Abs: 1.4 K/uL — ABNORMAL LOW (ref 1.7–7.7)
Neutrophils Relative %: 41 %
Platelet Count: 252 K/uL (ref 150–400)
RBC: 3.67 MIL/uL — ABNORMAL LOW (ref 3.87–5.11)
RDW: 15.1 % (ref 11.5–15.5)
WBC Count: 3.4 K/uL — ABNORMAL LOW (ref 4.0–10.5)
nRBC: 0 % (ref 0.0–0.2)

## 2024-05-01 LAB — CMP (CANCER CENTER ONLY)
ALT: 28 U/L (ref 0–44)
AST: 40 U/L (ref 15–41)
Albumin: 4 g/dL (ref 3.5–5.0)
Alkaline Phosphatase: 96 U/L (ref 38–126)
Anion gap: 10 (ref 5–15)
BUN: 17 mg/dL (ref 8–23)
CO2: 24 mmol/L (ref 22–32)
Calcium: 10.3 mg/dL (ref 8.9–10.3)
Chloride: 99 mmol/L (ref 98–111)
Creatinine: 1.21 mg/dL — ABNORMAL HIGH (ref 0.44–1.00)
GFR, Estimated: 49 mL/min — ABNORMAL LOW (ref >60)
Glucose, Bld: 88 mg/dL (ref 70–99)
Potassium: 3.7 mmol/L (ref 3.5–5.1)
Sodium: 133 mmol/L — ABNORMAL LOW (ref 135–145)
Total Bilirubin: 1 mg/dL (ref 0.0–1.2)
Total Protein: 7.8 g/dL (ref 6.5–8.1)

## 2024-05-01 LAB — MAGNESIUM: Magnesium: 2.1 mg/dL (ref 1.7–2.4)

## 2024-05-01 NOTE — Assessment & Plan Note (Addendum)
#   Recurrent breast cancer-ER positive PR negative ? HER-2/neu-currently on abema+ Anastrozole .   # FEB 2025- Stable postsurgical changes from breast reconstruction and right axillary dissection. Stable areas of scarring and fibrotic changes seen of the lungs. No developing new mass lesion, fluid collection or nodal enlargement.  # Continue Anastrazole [JAN Mid 2024-]-given improvement of LFTs/and neutropenia-- CONTINUE Verzenio - 100 mg twice daily Monday through Friday- if on going issues with infection/severe neutropenia- recommend lower dose 50 mg- BID. Will order scan today.   # Intermittent Hypercalcemia- HOLD off ca+vit D- improved- monitor for now.   # LFTs- elevated- x2-3; Normal bilirubin- [possibly Tylenol  versus Verzenio ]-resolved.  Monitor closely on Verzinio-   # Right Knee pain-  [Emerge Otho]; will start tramadol ; HOLD Tylenol .   # CKD stage II- stable.   # Hypomagnesia: 1.4- on OTC mag-   stable.  # DM- s/p endocrinology evaluation; with diet and medications [trucilicity; Novolog ]-    stable.  # recurrent UTI/ ? Suprapubic tenderness- S/p eval with urology.  Monitor closely on Abema/with neutropenia- s/p evaluation with Tinnie Dawn re: vaginal atrophy today-    stable.  # Low B 12 [PCP]- on B12 PO.   stable.  # Flu shot today- 06/02/23.   # IV access; PIV.   Q6m; :PREFERS AM appt- NOT Fridays  [facility]- # DISPOSITION: PREFERS AM appt- NOT Fridays # Follow-up  1 month--MD-  labs CBC CMP; magnesium ; CA 27-29; CT scan CAP - Dr. KATHEE

## 2024-05-01 NOTE — Progress Notes (Signed)
 No concerns today

## 2024-05-01 NOTE — Progress Notes (Signed)
 [name] OFFICE PROGRESS NOTE  Patient Care Team: Steva Clotilda DEL, NP as PCP - General (Family Medicine) Merrilyn Handler, MD (Inactive) (General Surgery) Curlene Agent, MD (Obstetrics and Gynecology) Lenn Aran, MD Wilder Pink, MD (Unknown Physician Specialty) Rennie Cindy SAUNDERS, MD as Consulting Physician (Oncology)   Cancer Staging  Carcinoma of lower-outer quadrant of right breast in female, estrogen receptor positive [name]) Staging form: Breast, AJCC 7th Edition - Pathologic: ypT1c,ypN2a, MX - Signed by Merrilyn Handler PARAS, MD on 03/10/2012 Cancer stage: ypT1c,ypN2a, MX    Oncology History Overview Note  # DEC 2012- RIGHT BREAST CA T2 N2 M0 tumor from biopsy.  Estrogen receptor positive, Progesterone receptor positive.  Current receptor negative by FISH 2. Neoadjuvant chemotherapy started in December of 28 with Cytoxan Adriamycin 3. Started on Taxol weekly chemotherapy. 4. Patient finished 12  cycles of Taxol chemotherapy in May of 2013.     5. Status post right modified radical mastectomy [Dr.Bowers; [place]] June of 2013, ypT1c  yp N2  MO. started also on Lerazole    7. Radiation therapy to the right breast (September of 2013).  Lymph node was positive for HER-2 receptor gene amplification of 2.22.  Will proceed with Herceptin treatment starting in September of 2013.   8.Patient has finished Herceptin (maintenance therapy) in August of 2014 8. Start patient on letrozole  from November, 2013. 9. Patient started on Herceptin in September 2013.    10Patient finished 12 months of Herceptin therapy on August, 2014  # 6. Status post left side prophylactic mastectomy.  #Late MAY 2018-RECURRENCE BREAST CA- ER positive/PR negative; ?? HER-2/neu- [biopsy- proven-mediastinal lymph node; in suff for her 2 testing].  [elevated Tumor marker- CT/PET- uptake in Right Mediastinal LN; Sternum [June 2018 EBUS- Dr.Kasa]   # March 10 2017- faslodex  + Abema; OCT 5th  CT-PR of mediastinal LN [consent]; Discontinued faslodex  sec to insurance issues [last DEC 2023]. JAN 2024- continue Abema + Anastrzole   #August 2020-left chest wall nodule biopsy benign  #August 2019- DVT-right upper extremity; Eliquis ; stop end of March 2021 [repeat ultrasound negative/patient preference]; # JAN 2022-Cirrhosis- ? On CT scan-FEB liver- Negative fro overt cirrhosis --------------------------------------------------------------- -    DIAGNOSIS: [ BREAST CANCER- ER/PR/HER2 NEU POS  STAGE:  4   ;GOALS: PALLIATIVE     Carcinoma of lower-outer quadrant of right breast in female, estrogen receptor positive (HCC)   INTERVAL HISTORY: Alone.  Ambulating independently.  [name] 68 y.o.  female pleasant patient above history of metastatic ER PR positive HER-2/neu positive breast cancer currently on abema [M-F; no weekends]+ Anastrazole  is here for follow-up.  No fever. She continues to take Anastrazole;and abemaciclib . No fevers or chills. No UTIs.  Denies achiness.  Has chronic knee pain right- worse recently.  Appetite is good. Fatigued.   Review of Systems  Constitutional:  Positive for malaise/fatigue. Negative for chills, diaphoresis, fever and weight loss.  HENT:  Negative for nosebleeds and sore throat.   Eyes:  Negative for double vision.  Respiratory:  Negative for cough, hemoptysis, sputum production, shortness of breath and wheezing.   Cardiovascular:  Negative for chest pain, palpitations, orthopnea and leg swelling.  Gastrointestinal:  Negative for abdominal pain, blood in stool, constipation, heartburn, melena, nausea and vomiting.  Musculoskeletal:  Positive for back pain and joint pain.  Neurological:  Negative for dizziness, tingling, focal weakness, weakness and headaches.  Endo/Heme/Allergies:  Does not bruise/bleed easily.  Psychiatric/Behavioral:  Negative for depression. The patient is not nervous/anxious  and does not have insomnia.        PAST MEDICAL HISTORY :  Past Medical History:  Diagnosis Date   Arthritis    Cancer of lower-outer quadrant of female breast (HCC) 08/06/2011   RIGHT, chemo and bilateral mastectomies.   High cholesterol    History of kidney stones    Hx of bilateral breast implants    Hypertension    Lung metastases 20118   chemo pills and faslidex shots.   PONV (postoperative nausea and vomiting)    Type II diabetes mellitus (HCC)    fasting 140-150   Vertigo    LAST WEEK   Vertigo 01/2017    PAST SURGICAL HISTORY :   Past Surgical History:  Procedure Laterality Date   BREAST BIOPSY  07/2011, 2020   right   BREAST RECONSTRUCTION  03/07/2012   Procedure: BREAST RECONSTRUCTION;  Surgeon: Alm Sick, MD;  Location: [facility] OR;  Service: Plastics;  Laterality: Left;  BREAST RECONSTRUCTION WITH PLACEMENT OF TISSUE EXPANDER TO LEFT BREAST   BREAST RECONSTRUCTION  02/06/2013   CESAREAN SECTION  [id number]   ENDOBRONCHIAL ULTRASOUND N/A 02/24/2017   Procedure: ENDOBRONCHIAL ULTRASOUND;  Surgeon: Isaiah Scrivener, MD;  Location: [facility] ORS;  Service: Cardiopulmonary;  Laterality: N/A;   LATISSIMUS FLAP TO BREAST Right 02/06/2013   Procedure: LATISSIMUS FLAP TO RIGHT BREAST WITH IMPLANT;  Surgeon: Alm Sick, MD;  Location: [name] OR;  Service: Plastics;  Laterality: Right;   MASTECTOMY  03/07/12   modified right; total left   MODIFIED MASTECTOMY  03/07/2012   Procedure: MODIFIED MASTECTOMY;  Surgeon: Sherlean JINNY Laughter, MD;  Location: [facility] OR;  Service: General;  Laterality: Right;   PORTACATH PLACEMENT  07/2011   SCAR REVISION  03/30/2012   Procedure: SCAR REVISION;  Surgeon: Sherlean JINNY Laughter, MD;  Location: [name];  Service: General;  Laterality: Right;  CLOSURE OF MASTECTOMY INCISION   WISDOM TOOTH EXTRACTION      FAMILY HISTORY :   Family History  Problem Relation Age of Onset   Breast cancer Mother    Diabetes Father     SOCIAL HISTORY:   Social History   Tobacco Use   Smoking  status: Never   Smokeless tobacco: Never  Vaping Use   Vaping status: Never Used  Substance Use Topics   Alcohol  use: No   Drug use: No    ALLERGIES:  is allergic to sulfa antibiotics.  MEDICATIONS:  Current Outpatient Medications  Medication Sig Dispense Refill   abemaciclib  (VERZENIO ) 100 MG tablet Take 1 tablet (100 mg total) by mouth 2 (two) times daily. 56 tablet 1   anastrozole  (ARIMIDEX ) 1 MG tablet TAKE 1 TABLET BY MOUTH EVERY DAY 90 tablet 2   atorvastatin  (LIPITOR) 20 MG tablet Take 1 tablet (20 mg total) by mouth daily. 90 tablet 3   carvedilol  (COREG ) 3.125 MG tablet Take 1 tablet (3.125 mg total) by mouth 2 (two) times daily with a meal. 120 tablet 3   Cholecalciferol  50 MCG (2000 UT) CAPS Take 2,000 Units by mouth daily.     clotrimazole -betamethasone  (LOTRISONE ) cream Apply topically daily as needed. 30 g 0   Cranberry-Vitamin C-Probiotic (AZO CRANBERRY PO) Take 1 Capful by mouth in the morning and at bedtime.     cyanocobalamin  (VITAMIN B12) 1000 MCG tablet 1,000 mcg in the morning and at bedtime.     FARXIGA 10 MG TABS tablet Take 10 mg by mouth daily.     glipiZIDE  (GLUCOTROL  XL) 10 MG 24  hr tablet Take 1 tablet (10 mg total) by mouth 2 (two) times daily. 180 tablet 3   insulin  aspart (NOVOLOG  FLEXPEN) 100 UNIT/ML FlexPen Inject 4 Units into the skin 3 (three) times daily with meals. 15 mL 0   levothyroxine  (SYNTHROID ) 25 MCG tablet Take 1 tablet (25 mcg total) by mouth daily before breakfast. 90 tablet 1   loperamide  (IMODIUM  A-D) 2 MG tablet Take 2 mg by mouth 4 (four) times daily as needed for diarrhea or loose stools.     magnesium  oxide (MAG-OX) 400 MG tablet Take 1 tablet by mouth 2 (two) times daily.     Melatonin 10 MG TABS Take 1 tablet by mouth at bedtime as needed.     Olopatadine  HCl 0.2 % SOLN Apply 1 drop to eye daily as needed.     ondansetron  (ZOFRAN -ODT) 4 MG disintegrating tablet Take 1 tablet (4 mg total) by mouth every 8 (eight) hours as needed for  nausea or vomiting. 30 tablet 3   potassium chloride  (KLOR-CON  M) 10 MEQ tablet Take 1 tablet (10 mEq total) by mouth daily. 7 tablet 0   promethazine  (PHENERGAN ) 25 MG tablet Take 1 tablet (25 mg total) by mouth every 8 (eight) hours as needed for nausea or vomiting. 15 tablet 0   traMADol  (ULTRAM ) 50 MG tablet Take 1 tablet (50 mg total) by mouth every 8 (eight) hours as needed. 60 tablet 0   No current facility-administered medications for this visit.    PHYSICAL EXAMINATION: ECOG PERFORMANCE STATUS: 1 - Symptomatic but completely ambulatory  BP 124/76 (BP Location: Left Arm, Patient Position: Sitting, Cuff Size: Normal)   Pulse 92   Temp (!) 97.5 F (36.4 C) (Tympanic)   Resp 16   Ht 5' 2 (1.575 m)   Wt 151 lb 12.8 oz (68.9 kg)   SpO2 99%   BMI 27.76 kg/m   Filed Weights   05/01/24 0934  Weight: 151 lb 12.8 oz (68.9 kg)      Physical Exam Constitutional:      Comments: She is alone.   HENT:     Head: Normocephalic and atraumatic.     Mouth/Throat:     Pharynx: No oropharyngeal exudate.  Eyes:     Pupils: Pupils are equal, round, and reactive to light.  Cardiovascular:     Rate and Rhythm: Normal rate and regular rhythm.  Pulmonary:     Effort: Pulmonary effort is normal. No respiratory distress.     Breath sounds: Normal breath sounds. No wheezing.  Abdominal:     General: Bowel sounds are normal. There is no distension.     Palpations: Abdomen is soft. There is no mass.     Tenderness: There is no abdominal tenderness. There is no guarding or rebound.  Musculoskeletal:        General: No tenderness. Normal range of motion.     Cervical back: Normal range of motion and neck supple.     Comments: Approximately 1 cm hard nodule felt in the anterior chest wall/left parasternal.  Skin:    General: Skin is warm.  Neurological:     Mental Status: She is alert and oriented to person, place, and time.  Psychiatric:        Mood and Affect: Affect normal.     LABORATORY DATA:  I have reviewed the data as listed    Component Value Date/Time   NA 133 (L) 05/01/2024 0940   NA 135 11/28/2014 1401   K 3.7  05/01/2024 0940   K 4.7 11/28/2014 1401   CL 99 05/01/2024 0940   CL 99 (L) 11/28/2014 1401   CO2 24 05/01/2024 0940   CO2 28 11/28/2014 1401   GLUCOSE 88 05/01/2024 0940   GLUCOSE 215 (H) 11/28/2014 1401   BUN 17 05/01/2024 0940   BUN 16 11/28/2014 1401   CREATININE 1.21 (H) 05/01/2024 0940   CREATININE 0.96 11/28/2014 1401   CALCIUM  10.3 05/01/2024 0940   CALCIUM  9.9 11/28/2014 1401   PROT 7.8 05/01/2024 0940   PROT 7.6 11/28/2014 1401   ALBUMIN 4.0 05/01/2024 0940   ALBUMIN 4.5 11/28/2014 1401   AST 40 05/01/2024 0940   ALT 28 05/01/2024 0940   ALT 28 11/28/2014 1401   ALKPHOS 96 05/01/2024 0940   ALKPHOS 78 11/28/2014 1401   BILITOT 1.0 05/01/2024 0940   GFRNONAA 49 (L) 05/01/2024 0940   GFRNONAA >60 11/28/2014 1401   GFRAA >60 05/30/2020 1022   GFRAA >60 11/28/2014 1401    No results found for: SPEP, UPEP  Lab Results  Component Value Date   WBC 3.4 (L) 05/01/2024   NEUTROABS 1.4 (L) 05/01/2024   HGB 11.9 (L) 05/01/2024   HCT 35.7 (L) 05/01/2024   MCV 97.3 05/01/2024   PLT 252 05/01/2024      Chemistry      Component Value Date/Time   NA 133 (L) 05/01/2024 0940   NA 135 11/28/2014 1401   K 3.7 05/01/2024 0940   K 4.7 11/28/2014 1401   CL 99 05/01/2024 0940   CL 99 (L) 11/28/2014 1401   CO2 24 05/01/2024 0940   CO2 28 11/28/2014 1401   BUN 17 05/01/2024 0940   BUN 16 11/28/2014 1401   CREATININE 1.21 (H) 05/01/2024 0940   CREATININE 0.96 11/28/2014 1401      Component Value Date/Time   CALCIUM  10.3 05/01/2024 0940   CALCIUM  9.9 11/28/2014 1401   ALKPHOS 96 05/01/2024 0940   ALKPHOS 78 11/28/2014 1401   AST 40 05/01/2024 0940   ALT 28 05/01/2024 0940   ALT 28 11/28/2014 1401   BILITOT 1.0 05/01/2024 0940       RADIOGRAPHIC STUDIES: I have personally reviewed the radiological images as  listed and agreed with the findings in the report. No results found.   ASSESSMENT & PLAN:  Carcinoma of lower-outer quadrant of right breast in female, estrogen receptor positive (HCC)  # Recurrent breast cancer-ER positive PR negative ? HER-2/neu-currently on abema+ Anastrozole .   # FEB 2025- Stable postsurgical changes from breast reconstruction and right axillary dissection. Stable areas of scarring and fibrotic changes seen of the lungs. No developing new mass lesion, fluid collection or nodal enlargement.  # Continue Anastrazole [JAN Mid 2024-]-given improvement of LFTs/and neutropenia-- CONTINUE Verzenio - 100 mg twice daily Monday through Friday- if on going issues with infection/severe neutropenia- recommend lower dose 50 mg- BID. Will order scan today.   # Intermittent Hypercalcemia- HOLD off ca+vit D- improved- monitor for now.   # LFTs- elevated- x2-3; Normal bilirubin- [possibly Tylenol  versus Verzenio ]-resolved.  Monitor closely on Verzinio-   # Right Knee pain-  [Emerge Otho]; will start tramadol ; HOLD Tylenol .   # CKD stage II- stable.   # Hypomagnesia: 1.4- on OTC mag-   stable.  # DM- s/p endocrinology evaluation; with diet and medications [trucilicity; Novolog ]-    stable.  # recurrent UTI/ ? Suprapubic tenderness- S/p eval with urology.  Monitor closely on Abema/with neutropenia- s/p evaluation with Tinnie Dawn re: vaginal atrophy  today-    stable.  # Low B 12 [PCP]- on B12 PO.   stable.  # Flu shot today- 06/02/23.   # IV access; PIV.   Q30m; :PREFERS AM appt- NOT Fridays  Mychart- # DISPOSITION: PREFERS AM appt- NOT Fridays # Follow-up  1 month--MD-  labs CBC CMP; magnesium ; CA 27-29; CT scan CAP - Dr. KATHEE     Orders Placed This Encounter  Procedures   CT CHEST ABDOMEN PELVIS W CONTRAST    Standing Status:   Future    Expected Date:   05/31/2024    Expiration Date:   05/01/2025    If indicated for the ordered procedure, I authorize the administration of  contrast media per Radiology protocol:   Yes    Does the patient have a contrast media/X-ray dye allergy?:   No    Preferred imaging location?:   [facility]    If indicated for the ordered procedure, I authorize the administration of oral contrast media per Radiology protocol:   Yes   CBC with Differential ([name])    Standing Status:   Future    Expected Date:   05/31/2024    Expiration Date:   05/01/2025   CMP ([name])    Standing Status:   Future    Expected Date:   05/31/2024    Expiration Date:   05/01/2025   Magnesium     Standing Status:   Future    Expected Date:   05/31/2024    Expiration Date:   05/01/2025   Cancer antigen 27.29    Standing Status:   Future    Expected Date:   05/31/2024    Expiration Date:   05/01/2025   All questions were answered. The patient knows to call the clinic with any problems, questions or concerns.      Cindy JONELLE Joe, MD 05/01/2024 11:06 AM

## 2024-05-01 NOTE — Progress Notes (Signed)
 Specialty Pharmacy Refill Coordination Note  [name] is a 68 y.o. female contacted today regarding refills of specialty medication(s) Abemaciclib  (VERZENIO )   Patient requested Delivery   Delivery date: 05/07/24   Verified address: [address] [place] [address]   Medication will be filled on 09.05.25.

## 2024-05-02 LAB — CANCER ANTIGEN 27.29: CA 27.29: 33.8 U/mL (ref 0.0–38.6)

## 2024-05-03 ENCOUNTER — Other Ambulatory Visit: Payer: Self-pay

## 2024-05-21 ENCOUNTER — Encounter: Payer: Self-pay | Admitting: Internal Medicine

## 2024-05-21 NOTE — Telephone Encounter (Signed)
 Spoke with patient over the phone, she is aware that [name] has replaced Odis and will be the person to assist her moving forward. I reassured her that she is on our radar to take care of for medication assistance for 2026.   No further action needed at this time.

## 2024-05-24 ENCOUNTER — Other Ambulatory Visit: Payer: Self-pay

## 2024-05-24 ENCOUNTER — Other Ambulatory Visit: Payer: Self-pay | Admitting: Internal Medicine

## 2024-05-24 DIAGNOSIS — Z170 Estrogen receptor positive status [ER+]: Secondary | ICD-10-CM

## 2024-05-25 ENCOUNTER — Other Ambulatory Visit: Payer: Self-pay

## 2024-05-25 ENCOUNTER — Encounter: Payer: Self-pay | Admitting: Internal Medicine

## 2024-05-25 ENCOUNTER — Encounter (INDEPENDENT_AMBULATORY_CARE_PROVIDER_SITE_OTHER): Payer: Self-pay

## 2024-05-25 MED ORDER — ABEMACICLIB 100 MG PO TABS
100.0000 mg | ORAL_TABLET | Freq: Two times a day (BID) | ORAL | 1 refills | Status: DC
  Filled 2024-05-25 – 2024-05-28 (×2): qty 56, 28d supply, fill #0
  Filled 2024-06-20: qty 56, 28d supply, fill #1

## 2024-05-28 ENCOUNTER — Other Ambulatory Visit: Payer: Self-pay | Admitting: Pharmacy Technician

## 2024-05-28 ENCOUNTER — Other Ambulatory Visit: Payer: Self-pay

## 2024-05-28 NOTE — Progress Notes (Signed)
 Specialty Pharmacy Refill Coordination Note  Kim[name] is a 68 y.o. female contacted today regarding refills of specialty medication(s) Abemaciclib  (VERZENIO )   Patient requested Delivery Delivery date: 05/30/24 Verified address: [address] [place], [place] [address]   Medication will be filled on 05/29/24.   Patient answered Questionnaire.

## 2024-05-29 ENCOUNTER — Ambulatory Visit
Admission: RE | Admit: 2024-05-29 | Discharge: 2024-05-29 | Disposition: A | Discharge status: Home / Self Care | Source: Ambulatory Visit | Attending: Internal Medicine | Admitting: Internal Medicine

## 2024-05-29 ENCOUNTER — Other Ambulatory Visit (HOSPITAL_COMMUNITY): Payer: Self-pay

## 2024-05-29 ENCOUNTER — Other Ambulatory Visit: Payer: Self-pay

## 2024-05-29 ENCOUNTER — Encounter: Payer: Self-pay | Admitting: Internal Medicine

## 2024-05-29 DIAGNOSIS — K8020 Calculus of gallbladder without cholecystitis without obstruction: Secondary | ICD-10-CM | POA: Diagnosis not present

## 2024-05-29 DIAGNOSIS — I700 Atherosclerosis of aorta: Secondary | ICD-10-CM | POA: Diagnosis not present

## 2024-05-29 DIAGNOSIS — C50511 Malignant neoplasm of lower-outer quadrant of right female breast: Secondary | ICD-10-CM

## 2024-05-29 DIAGNOSIS — K5730 Diverticulosis of large intestine without perforation or abscess without bleeding: Secondary | ICD-10-CM | POA: Diagnosis not present

## 2024-05-29 MED ORDER — IOPAMIDOL (ISOVUE-300) INJECTION 61%
100.0000 mL | Freq: Once | INTRAVENOUS | Status: AC | PRN
Start: 2024-05-29 — End: 2024-05-29
  Administered 2024-05-29: 100 mL via INTRAVENOUS

## 2024-06-04 ENCOUNTER — Encounter: Payer: Self-pay | Admitting: Internal Medicine

## 2024-06-04 NOTE — Telephone Encounter (Signed)
 Msg sent to scheduling to add pt to inj encounter tom. When pt comes to the clinic.

## 2024-06-05 ENCOUNTER — Ambulatory Visit

## 2024-06-05 ENCOUNTER — Inpatient Hospital Stay: Attending: Internal Medicine

## 2024-06-05 ENCOUNTER — Encounter: Payer: Self-pay | Admitting: Nurse Practitioner

## 2024-06-05 ENCOUNTER — Inpatient Hospital Stay: Admitting: Nurse Practitioner

## 2024-06-05 VITALS — BP 129/68 | HR 102 | Temp 97.8°F | Resp 16 | Wt 153.8 lb

## 2024-06-05 DIAGNOSIS — I2510 Atherosclerotic heart disease of native coronary artery without angina pectoris: Secondary | ICD-10-CM | POA: Insufficient documentation

## 2024-06-05 DIAGNOSIS — Z7984 Long term (current) use of oral hypoglycemic drugs: Secondary | ICD-10-CM | POA: Diagnosis not present

## 2024-06-05 DIAGNOSIS — Z79811 Long term (current) use of aromatase inhibitors: Secondary | ICD-10-CM | POA: Insufficient documentation

## 2024-06-05 DIAGNOSIS — E1122 Type 2 diabetes mellitus with diabetic chronic kidney disease: Secondary | ICD-10-CM | POA: Insufficient documentation

## 2024-06-05 DIAGNOSIS — Z170 Estrogen receptor positive status [ER+]: Secondary | ICD-10-CM | POA: Diagnosis not present

## 2024-06-05 DIAGNOSIS — M25561 Pain in right knee: Secondary | ICD-10-CM | POA: Diagnosis not present

## 2024-06-05 DIAGNOSIS — Z794 Long term (current) use of insulin: Secondary | ICD-10-CM | POA: Diagnosis not present

## 2024-06-05 DIAGNOSIS — Z9013 Acquired absence of bilateral breasts and nipples: Secondary | ICD-10-CM | POA: Insufficient documentation

## 2024-06-05 DIAGNOSIS — Z23 Encounter for immunization: Secondary | ICD-10-CM | POA: Diagnosis not present

## 2024-06-05 DIAGNOSIS — I129 Hypertensive chronic kidney disease with stage 1 through stage 4 chronic kidney disease, or unspecified chronic kidney disease: Secondary | ICD-10-CM | POA: Diagnosis not present

## 2024-06-05 DIAGNOSIS — C50511 Malignant neoplasm of lower-outer quadrant of right female breast: Secondary | ICD-10-CM

## 2024-06-05 DIAGNOSIS — Z86718 Personal history of other venous thrombosis and embolism: Secondary | ICD-10-CM | POA: Diagnosis not present

## 2024-06-05 DIAGNOSIS — Z79899 Other long term (current) drug therapy: Secondary | ICD-10-CM

## 2024-06-05 DIAGNOSIS — E538 Deficiency of other specified B group vitamins: Secondary | ICD-10-CM | POA: Insufficient documentation

## 2024-06-05 DIAGNOSIS — Z5181 Encounter for therapeutic drug level monitoring: Secondary | ICD-10-CM | POA: Diagnosis not present

## 2024-06-05 DIAGNOSIS — N182 Chronic kidney disease, stage 2 (mild): Secondary | ICD-10-CM | POA: Diagnosis not present

## 2024-06-05 DIAGNOSIS — M8580 Other specified disorders of bone density and structure, unspecified site: Secondary | ICD-10-CM | POA: Insufficient documentation

## 2024-06-05 DIAGNOSIS — Z803 Family history of malignant neoplasm of breast: Secondary | ICD-10-CM | POA: Insufficient documentation

## 2024-06-05 DIAGNOSIS — Z87440 Personal history of urinary (tract) infections: Secondary | ICD-10-CM | POA: Diagnosis not present

## 2024-06-05 DIAGNOSIS — C7800 Secondary malignant neoplasm of unspecified lung: Secondary | ICD-10-CM | POA: Insufficient documentation

## 2024-06-05 LAB — CMP (CANCER CENTER ONLY)
ALT: 24 U/L (ref 0–44)
AST: 39 U/L (ref 15–41)
Albumin: 3.9 g/dL (ref 3.5–5.0)
Alkaline Phosphatase: 87 U/L (ref 38–126)
Anion gap: 11 (ref 5–15)
BUN: 13 mg/dL (ref 8–23)
CO2: 22 mmol/L (ref 22–32)
Calcium: 10.1 mg/dL (ref 8.9–10.3)
Chloride: 101 mmol/L (ref 98–111)
Creatinine: 1.21 mg/dL — ABNORMAL HIGH (ref 0.44–1.00)
GFR, Estimated: 49 mL/min — ABNORMAL LOW (ref >60)
Glucose, Bld: 77 mg/dL (ref 70–99)
Potassium: 3.3 mmol/L — ABNORMAL LOW (ref 3.5–5.1)
Sodium: 134 mmol/L — ABNORMAL LOW (ref 135–145)
Total Bilirubin: 0.9 mg/dL (ref 0.0–1.2)
Total Protein: 7.5 g/dL (ref 6.5–8.1)

## 2024-06-05 LAB — CBC WITH DIFFERENTIAL (CANCER CENTER ONLY)
Abs Immature Granulocytes: 0.02 K/uL (ref 0.00–0.07)
Basophils Absolute: 0.1 K/uL (ref 0.0–0.1)
Basophils Relative: 2 %
Eosinophils Absolute: 0.1 K/uL (ref 0.0–0.5)
Eosinophils Relative: 2 %
HCT: 32.9 % — ABNORMAL LOW (ref 36.0–46.0)
Hemoglobin: 11.4 g/dL — ABNORMAL LOW (ref 12.0–15.0)
Immature Granulocytes: 1 %
Lymphocytes Relative: 45 %
Lymphs Abs: 1.9 K/uL (ref 0.7–4.0)
MCH: 33.5 pg (ref 26.0–34.0)
MCHC: 34.7 g/dL (ref 30.0–36.0)
MCV: 96.8 fL (ref 80.0–100.0)
Monocytes Absolute: 0.3 K/uL (ref 0.1–1.0)
Monocytes Relative: 6 %
Neutro Abs: 1.9 K/uL (ref 1.7–7.7)
Neutrophils Relative %: 44 %
Platelet Count: 222 K/uL (ref 150–400)
RBC: 3.4 MIL/uL — ABNORMAL LOW (ref 3.87–5.11)
RDW: 14.8 % (ref 11.5–15.5)
WBC Count: 4.3 K/uL (ref 4.0–10.5)
nRBC: 0 % (ref 0.0–0.2)

## 2024-06-05 LAB — MAGNESIUM: Magnesium: 2 mg/dL (ref 1.7–2.4)

## 2024-06-05 MED ORDER — INFLUENZA VAC SPLIT HIGH-DOSE 0.5 ML IM SUSY
0.5000 mL | PREFILLED_SYRINGE | Freq: Once | INTRAMUSCULAR | Status: AC
  Administered 2024-06-05: 0.5 mL via INTRAMUSCULAR
  Filled 2024-06-05: qty 0.5

## 2024-06-05 MED ORDER — CLOTRIMAZOLE-BETAMETHASONE 1-0.05 % EX CREA: TOPICAL_CREAM | Freq: Every day | CUTANEOUS | 0 refills | Status: AC | PRN

## 2024-06-05 MED ORDER — ANASTROZOLE 1 MG PO TABS: 1.0000 mg | ORAL_TABLET | Freq: Every day | ORAL | 2 refills | Status: AC

## 2024-06-05 NOTE — Progress Notes (Signed)
 Pt in for follow up denies any concerns today.  Would like flu vaccine and refills on lotrisone  cream and  anastrozole .

## 2024-06-05 NOTE — Progress Notes (Signed)
 [name] OFFICE PROGRESS NOTE  Patient Care Team: Steva Clotilda DEL, NP as PCP - General (Family Medicine) Merrilyn Handler, MD (Inactive) (General Surgery) Curlene Agent, MD (Obstetrics and Gynecology) Lenn Aran, MD Wilder Pink, MD (Unknown Physician Specialty) Rennie Cindy SAUNDERS, MD as Consulting Physician (Oncology)   Cancer Staging  Carcinoma of lower-outer quadrant of right breast in female, estrogen receptor positive [facility]) Staging form: Breast, AJCC 7th Edition - Pathologic: ypT1c,ypN2a, MX - Signed by Merrilyn Handler PARAS, MD on 03/10/2012 Cancer stage: ypT1c,ypN2a, MX  Oncology History Overview Note  # DEC 2012- RIGHT BREAST CA T2 N2 M0 tumor from biopsy.  Estrogen receptor positive, Progesterone receptor positive.  Current receptor negative by FISH 2. Neoadjuvant chemotherapy started in December of 28 with Cytoxan Adriamycin 3. Started on Taxol weekly chemotherapy. 4. Patient finished 12  cycles of Taxol chemotherapy in May of 2013.     5. Status post right modified radical mastectomy [Dr.Bowers; [place]] June of 2013, ypT1c  yp N2  MO. started also on Lerazole    7. Radiation therapy to the right breast (September of 2013).  Lymph node was positive for HER-2 receptor gene amplification of 2.22.  Will proceed with Herceptin treatment starting in September of 2013.   8.Patient has finished Herceptin (maintenance therapy) in August of 2014 8. Start patient on letrozole  from November, 2013. 9. Patient started on Herceptin in September 2013.    10Patient finished 12 months of Herceptin therapy on August, 2014  # 6. Status post left side prophylactic mastectomy.  #Late MAY 2018-RECURRENCE BREAST CA- ER positive/PR negative; ?? HER-2/neu- [biopsy- proven-mediastinal lymph node; in suff for her 2 testing].  [elevated Tumor marker- CT/PET- uptake in Right Mediastinal LN; Sternum [June 2018 EBUS- Dr.Kasa]   # March 10 2017- faslodex  + Abema; OCT 5th CT-PR  of mediastinal LN [consent]; Discontinued faslodex  sec to insurance issues [last DEC 2023]. JAN 2024- continue Abema + Anastrzole   #August 2020-left chest wall nodule biopsy benign  #August 2019- DVT-right upper extremity; Eliquis ; stop end of March 2021 [repeat ultrasound negative/patient preference]; # JAN 2022-Cirrhosis- ? On CT scan-FEB liver- Negative fro overt cirrhosis --------------------------------------------------------------- -    DIAGNOSIS: [ BREAST CANCER- ER/PR/HER2 NEU POS  STAGE:  4   ;GOALS: PALLIATIVE     Carcinoma of lower-outer quadrant of right breast in female, estrogen receptor positive (HCC)   INTERVAL HISTORY: Alone.  Ambulating independently.  Kimberly [name] 68 y.o.  female pleasant patient above history of metastatic ER PR positive HER-2/neu positive breast cancer currently on abema [M-F; no weekends] + Anastrazole who returns to clinic for follow up.     No fever. She continues to take Anastrazole;and abemaciclib . No fevers or chills. No UTIs.  Denies achiness.  Has chronic knee pain right- worse recently.  Appetite is good. Fatigued.   Review of Systems  Constitutional:  Positive for malaise/fatigue. Negative for chills, diaphoresis, fever and weight loss.  HENT:  Negative for nosebleeds and sore throat.   Eyes:  Negative for double vision.  Respiratory:  Negative for cough, hemoptysis, sputum production, shortness of breath and wheezing.   Cardiovascular:  Negative for chest pain, palpitations, orthopnea and leg swelling.  Gastrointestinal:  Negative for abdominal pain, blood in stool, constipation, heartburn, melena, nausea and vomiting.  Musculoskeletal:  Positive for back pain and joint pain.  Neurological:  Negative for dizziness, tingling, focal weakness, weakness and headaches.  Endo/Heme/Allergies:  Does not bruise/bleed easily.  Psychiatric/Behavioral:  Negative for depression. The  patient is not nervous/anxious and does not have  insomnia.    PAST MEDICAL HISTORY :  Past Medical History:  Diagnosis Date   Arthritis    Cancer of lower-outer quadrant of female breast (HCC) 08/06/2011   RIGHT, chemo and bilateral mastectomies.   High cholesterol    History of kidney stones    Hx of bilateral breast implants    Hypertension    Lung metastases 20118   chemo pills and faslidex shots.   PONV (postoperative nausea and vomiting)    Type II diabetes mellitus (HCC)    fasting 140-150   Vertigo    LAST WEEK   Vertigo 01/2017   PAST SURGICAL HISTORY :   Past Surgical History:  Procedure Laterality Date   BREAST BIOPSY  07/2011, 2020   right   BREAST RECONSTRUCTION  03/07/2012   Procedure: BREAST RECONSTRUCTION;  Surgeon: Alm Sick, MD;  Location: [name] OR;  Service: Plastics;  Laterality: Left;  BREAST RECONSTRUCTION WITH PLACEMENT OF TISSUE EXPANDER TO LEFT BREAST   BREAST RECONSTRUCTION  02/06/2013   CESAREAN SECTION  [id number]   ENDOBRONCHIAL ULTRASOUND N/A 02/24/2017   Procedure: ENDOBRONCHIAL ULTRASOUND;  Surgeon: Isaiah Scrivener, MD;  Location: [facility] ORS;  Service: Cardiopulmonary;  Laterality: N/A;   LATISSIMUS FLAP TO BREAST Right 02/06/2013   Procedure: LATISSIMUS FLAP TO RIGHT BREAST WITH IMPLANT;  Surgeon: Alm Sick, MD;  Location: [facility] OR;  Service: Plastics;  Laterality: Right;   MASTECTOMY  03/07/12   modified right; total left   MODIFIED MASTECTOMY  03/07/2012   Procedure: MODIFIED MASTECTOMY;  Surgeon: Sherlean JINNY Laughter, MD;  Location: [facility] OR;  Service: General;  Laterality: Right;   PORTACATH PLACEMENT  07/2011   SCAR REVISION  03/30/2012   Procedure: SCAR REVISION;  Surgeon: Sherlean JINNY Laughter, MD;  Location: [name];  Service: General;  Laterality: Right;  CLOSURE OF MASTECTOMY INCISION   WISDOM TOOTH EXTRACTION     FAMILY HISTORY :   Family History  Problem Relation Age of Onset   Breast cancer Mother    Diabetes Father    SOCIAL HISTORY:   Social History   Tobacco Use   Smoking  status: Never   Smokeless tobacco: Never  Vaping Use   Vaping status: Never Used  Substance Use Topics   Alcohol  use: No   Drug use: No   ALLERGIES:  is allergic to sulfa antibiotics.  MEDICATIONS:  Current Outpatient Medications  Medication Sig Dispense Refill   abemaciclib  (VERZENIO ) 100 MG tablet Take 1 tablet (100 mg total) by mouth 2 (two) times daily. 56 tablet 1   anastrozole  (ARIMIDEX ) 1 MG tablet TAKE 1 TABLET BY MOUTH EVERY DAY 90 tablet 2   atorvastatin  (LIPITOR) 20 MG tablet Take 1 tablet (20 mg total) by mouth daily. 90 tablet 3   carvedilol  (COREG ) 3.125 MG tablet Take 1 tablet (3.125 mg total) by mouth 2 (two) times daily with a meal. 120 tablet 3   Cholecalciferol  50 MCG (2000 UT) CAPS Take 2,000 Units by mouth daily.     clotrimazole -betamethasone  (LOTRISONE ) cream Apply topically daily as needed. 30 g 0   Cranberry-Vitamin C-Probiotic (AZO CRANBERRY PO) Take 1 Capful by mouth in the morning and at bedtime.     cyanocobalamin  (VITAMIN B12) 1000 MCG tablet 1,000 mcg in the morning and at bedtime.     FARXIGA 10 MG TABS tablet Take 10 mg by mouth daily.     glipiZIDE  (GLUCOTROL  XL) 10 MG 24 hr tablet Take  1 tablet (10 mg total) by mouth 2 (two) times daily. 180 tablet 3   insulin  aspart (NOVOLOG  FLEXPEN) 100 UNIT/ML FlexPen Inject 4 Units into the skin 3 (three) times daily with meals. 15 mL 0   levothyroxine  (SYNTHROID ) 25 MCG tablet Take 1 tablet (25 mcg total) by mouth daily before breakfast. 90 tablet 1   magnesium  oxide (MAG-OX) 400 MG tablet Take 1 tablet by mouth 2 (two) times daily.     Melatonin 10 MG TABS Take 1 tablet by mouth at bedtime as needed.     Olopatadine  HCl 0.2 % SOLN Apply 1 drop to eye daily as needed.     ondansetron  (ZOFRAN -ODT) 4 MG disintegrating tablet Take 1 tablet (4 mg total) by mouth every 8 (eight) hours as needed for nausea or vomiting. 30 tablet 3   potassium chloride  (KLOR-CON  M) 10 MEQ tablet Take 1 tablet (10 mEq total) by mouth  daily. 7 tablet 0   promethazine  (PHENERGAN ) 25 MG tablet Take 1 tablet (25 mg total) by mouth every 8 (eight) hours as needed for nausea or vomiting. 15 tablet 0   loperamide  (IMODIUM  A-D) 2 MG tablet Take 2 mg by mouth 4 (four) times daily as needed for diarrhea or loose stools. (Patient not taking: Reported on 06/05/2024)     traMADol  (ULTRAM ) 50 MG tablet Take 1 tablet (50 mg total) by mouth every 8 (eight) hours as needed. (Patient not taking: Reported on 06/05/2024) 60 tablet 0   No current facility-administered medications for this visit.    PHYSICAL EXAMINATION: ECOG PERFORMANCE STATUS: 1 - Symptomatic but completely ambulatory  BP 129/68 (BP Location: Left Arm, Patient Position: Sitting)   Pulse (!) 102   Temp 97.8 F (36.6 C) (Tympanic)   Resp 16   Wt 153 lb 12.8 oz (69.8 kg)   SpO2 98%   BMI 28.13 kg/m   Filed Weights   06/05/24 1038  Weight: 153 lb 12.8 oz (69.8 kg)   Physical Exam Vitals reviewed.  Constitutional:      Appearance: She is not ill-appearing.     Comments: She is alone.   HENT:     Head: Normocephalic and atraumatic.     Mouth/Throat:     Pharynx: No oropharyngeal exudate.  Cardiovascular:     Rate and Rhythm: Normal rate and regular rhythm.  Pulmonary:     Effort: Pulmonary effort is normal. No respiratory distress.     Breath sounds: No wheezing.  Chest:     Comments: ~ 1 cm hard nodule felt in the anterior chest wall/left parasternal Abdominal:     General: There is no distension.     Palpations: Abdomen is soft.     Tenderness: There is no abdominal tenderness. There is no guarding.  Musculoskeletal:        General: No tenderness or deformity.  Skin:    General: Skin is warm.     Coloration: Skin is not pale.  Neurological:     Mental Status: She is alert and oriented to person, place, and time.  Psychiatric:        Mood and Affect: Mood and affect normal.        Behavior: Behavior normal.    LABORATORY DATA:  I have reviewed the  data as listed    Component Value Date/Time   NA 134 (L) 06/05/2024 1010   NA 135 11/28/2014 1401   K 3.3 (L) 06/05/2024 1010   K 4.7 11/28/2014 1401   CL 101  06/05/2024 1010   CL 99 (L) 11/28/2014 1401   CO2 22 06/05/2024 1010   CO2 28 11/28/2014 1401   GLUCOSE 77 06/05/2024 1010   GLUCOSE 215 (H) 11/28/2014 1401   BUN 13 06/05/2024 1010   BUN 16 11/28/2014 1401   CREATININE 1.21 (H) 06/05/2024 1010   CREATININE 0.96 11/28/2014 1401   CALCIUM  10.1 06/05/2024 1010   CALCIUM  9.9 11/28/2014 1401   PROT 7.5 06/05/2024 1010   PROT 7.6 11/28/2014 1401   ALBUMIN 3.9 06/05/2024 1010   ALBUMIN 4.5 11/28/2014 1401   AST 39 06/05/2024 1010   ALT 24 06/05/2024 1010   ALT 28 11/28/2014 1401   ALKPHOS 87 06/05/2024 1010   ALKPHOS 78 11/28/2014 1401   BILITOT 0.9 06/05/2024 1010   GFRNONAA 49 (L) 06/05/2024 1010   GFRNONAA >60 11/28/2014 1401   GFRAA >60 05/30/2020 1022   GFRAA >60 11/28/2014 1401   Lab Results  Component Value Date   WBC 4.3 06/05/2024   NEUTROABS 1.9 06/05/2024   HGB 11.4 (L) 06/05/2024   HCT 32.9 (L) 06/05/2024   MCV 96.8 06/05/2024   PLT 222 06/05/2024      Chemistry      Component Value Date/Time   NA 134 (L) 06/05/2024 1010   NA 135 11/28/2014 1401   K 3.3 (L) 06/05/2024 1010   K 4.7 11/28/2014 1401   CL 101 06/05/2024 1010   CL 99 (L) 11/28/2014 1401   CO2 22 06/05/2024 1010   CO2 28 11/28/2014 1401   BUN 13 06/05/2024 1010   BUN 16 11/28/2014 1401   CREATININE 1.21 (H) 06/05/2024 1010   CREATININE 0.96 11/28/2014 1401      Component Value Date/Time   CALCIUM  10.1 06/05/2024 1010   CALCIUM  9.9 11/28/2014 1401   ALKPHOS 87 06/05/2024 1010   ALKPHOS 78 11/28/2014 1401   AST 39 06/05/2024 1010   ALT 24 06/05/2024 1010   ALT 28 11/28/2014 1401   BILITOT 0.9 06/05/2024 1010       RADIOGRAPHIC STUDIES: I have personally reviewed the radiological images as listed and agreed with the findings in the report.  05/02/24- CT Chest Abdomen Pelvis  W Contrast EXAM: CT CHEST, ABDOMEN, AND PELVIS WITH CONTRAST   TECHNIQUE: Multidetector CT imaging of the chest, abdomen and pelvis was performed following the standard protocol during bolus administration of intravenous contrast.   RADIATION DOSE REDUCTION: This exam was performed according to the departmental dose-optimization program which includes automated exposure control, adjustment of the mA and/or kV according to patient size and/or use of iterative reconstruction technique.   CONTRAST:  100mL ISOVUE -300 IOPAMIDOL  (ISOVUE -300) INJECTION 61% additional oral enteric contrast   COMPARISON:  10/13/2023   FINDINGS: CT CHEST FINDINGS   Cardiovascular: Aortic atherosclerosis. Normal heart size. Left coronary artery calcifications. No pericardial effusion.   Mediastinum/Nodes: No enlarged mediastinal, hilar, or axillary lymph nodes. Thyroid  gland, trachea, and esophagus demonstrate no significant findings.   Lungs/Pleura: Subpleural radiation fibrosis of the anterior right lung. Dependent bibasilar scarring or atelectasis. No pleural effusion or pneumothorax.   Musculoskeletal: Status post bilateral mastectomy with implant reconstruction. No acute osseous findings.   CT ABDOMEN PELVIS FINDINGS   Hepatobiliary: No solid liver abnormality is seen. Small gallstones. No gallbladder wall thickening, or biliary dilatation.   Pancreas: Unremarkable. No pancreatic ductal dilatation or surrounding inflammatory changes.   Spleen: Normal in size without significant abnormality.   Adrenals/Urinary Tract: Adrenal glands are unremarkable. Punctuate nonobstructive calculus of the inferior pole of  the left kidney. No right-sided calculi, ureteral calculi, or hydronephrosis. Simple benign left renal cortical cysts for which no further follow-up or characterization is required. Bladder is unremarkable.   Stomach/Bowel: Stomach is within normal limits. Appendix appears normal. No  evidence of bowel wall thickening, distention, or inflammatory changes. Sigmoid diverticulosis. Moderate burden of stool throughout the colon and rectum.   Vascular/Lymphatic: Aortic atherosclerosis. Splenorenal varices in the left hemiabdomen. No enlarged abdominal or pelvic lymph nodes.   Reproductive: No mass or other abnormality.   Other: No abdominal wall hernia or abnormality. No ascites.   Musculoskeletal: No acute osseous findings.   IMPRESSION: 1. Status post bilateral mastectomy with implant reconstruction. 2. No evidence of lymphadenopathy or metastatic disease in the chest, abdomen, or pelvis. 3. Subpleural radiation fibrosis of the anterior right lung. 4. Cholelithiasis. 5. Punctuate nonobstructive left nephrolithiasis. 6. Sigmoid diverticulosis without evidence of acute diverticulitis. 7. Coronary artery disease.   Aortic Atherosclerosis (ICD10-I70.0).     Electronically Signed   By: Marolyn JONETTA Jaksch M.D.   On: 05/29/2024 10:50  No results found.   ASSESSMENT & PLAN:   Carcinoma of lower-outer quadrant of right breast in female, estrogen receptor positive  # Recurrent breast cancer-ER positive PR negative ? HER-2/neu-currently on abema+ Anastrozole .    # FEB 2025- Stable postsurgical changes from breast reconstruction and right axillary dissection. Stable areas of scarring and fibrotic changes seen of the lungs. No developing new mass lesion, fluid collection or nodal enlargement.   # I independently reviewed CT C/A/P from 05/29/24 with patient today and discussed no evidence of progressive disease.  I provided the patient with a printed copy of her report after discussing in detail.  CA 27-29 fluctuates but has generally been stable in the 20s and 30s.  No apparent uptrend.  Clinically asymptomatic of progressive disease.  Today CA-125 29 is pending.  She will continue anastrozole  (mid January 2024) and Verzenio  100 mg twice a day Monday through Friday.  If ongoing  issues with infection and/or severe neutropenia, recommend lowering dose to 50 mg twice a day.   # Intermittent Hypercalcemia- HOLD off ca+vit D- improved- monitor for now.    # LFTs- elevated- x2-3; Normal bilirubin- [possibly Tylenol  versus Verzenio ]-resolved.  Monitor closely on Verzinio   # Right Knee pain- [Emerge Otho]; will start tramadol ; HOLD Tylenol .    # CKD stage II- stable.    # Hypomagnesia: 2.0.  Continue over-the-counter magnesium     # DM- s/p endocrinology evaluation; with diet and medications [trucilicity; Novolog ]- stable.   # Vulvovaginal atrophy- Declined uro-gyn. Using clotrimazole -betamethasone . Discussed she is likely having thinning of vulvar tissue secondary to low estrogen and chronic steroid use. Would recommend vaginal moisturizers. Refilled lotrisone  at her request.   # Frequent UTI- declined uro-gyn. None recently. Would recommend topical estrogen to urethra +/- suppressive therapy if recurrent symptoms.   # Osteopenia- no recent bone density on file. Discuss at next visit.    # Low B 12 [PCP]- on B12 PO. Stable.   # Flu shot today- 06/02/23.    # IV access; PIV.    Q66m: PREFERS AM appt- NOT Fridays   Mychart-  DISPOSITION: PREFERS AM appt- NOT Fridays - 1 month- labs (cbc, cmp, mag, ca27.29), Dr Rennie- la   No problem-specific Assessment & Plan notes found for this encounter.  No orders of the defined types were placed in this encounter.  All questions were answered. The patient knows to call the clinic with any problems, questions  or concerns.    Tinnie KANDICE Dawn, NP 06/05/2024

## 2024-06-06 LAB — CANCER ANTIGEN 27.29: CA 27.29: 34.9 U/mL (ref 0.0–38.6)

## 2024-06-19 ENCOUNTER — Encounter (INDEPENDENT_AMBULATORY_CARE_PROVIDER_SITE_OTHER): Payer: Self-pay

## 2024-06-20 ENCOUNTER — Other Ambulatory Visit: Payer: Self-pay

## 2024-06-20 NOTE — Progress Notes (Signed)
 Specialty Pharmacy Refill Coordination Note  Kimberly [name] is a 68 y.o. female contacted today regarding refills of specialty medication(s) Abemaciclib  (VERZENIO )   Patient requested (Patient-Rptd) Delivery   Delivery date: 06/21/24   Verified address: (Patient-Rptd) [address], [place], [place] [address]   Medication will be filled on 06/20/24.

## 2024-06-27 ENCOUNTER — Telehealth: Payer: Self-pay | Admitting: *Deleted

## 2024-06-27 ENCOUNTER — Encounter: Payer: Self-pay | Admitting: Internal Medicine

## 2024-06-27 ENCOUNTER — Other Ambulatory Visit: Payer: Self-pay | Admitting: Nurse Practitioner

## 2024-06-27 MED ORDER — CYCLOBENZAPRINE HCL 5 MG PO TABS: 5.0000 mg | ORAL_TABLET | Freq: Three times a day (TID) | ORAL | 0 refills | Status: AC | PRN

## 2024-06-27 NOTE — Telephone Encounter (Signed)
 Connected with patient by phone regarding her my chart message regarding cramping/spasm in leg. Pt has been compliant and continues taking daily potassium and magnesium , Informed her [name] has sent in a prescription for flexeril. Patient very appreciative. Pt does have an appt next Thursday with [name]. Instructed her to call back if symptom does not improve with flexeril.

## 2024-06-27 NOTE — Telephone Encounter (Signed)
 Cover My Meds prior auth initiated : Kimberly [name] ([name]) [id number] Cyclobenzaprine HCl 5MG  tablets status: Question ResponseCreated: October 29th, 2025 by [facility]# [phone]

## 2024-06-27 NOTE — Telephone Encounter (Signed)
 Insurance has approved flexeril. Called pt to let her know medication has been authorized. I called [facility] in [place] and let them know. They ran it and said yes $0 dollars due. They will fill it and have it ready for her.

## 2024-07-04 ENCOUNTER — Other Ambulatory Visit: Payer: Self-pay | Admitting: *Deleted

## 2024-07-04 DIAGNOSIS — C50511 Malignant neoplasm of lower-outer quadrant of right female breast: Secondary | ICD-10-CM

## 2024-07-05 ENCOUNTER — Inpatient Hospital Stay (HOSPITAL_BASED_OUTPATIENT_CLINIC_OR_DEPARTMENT_OTHER): Admitting: Nurse Practitioner

## 2024-07-05 ENCOUNTER — Ambulatory Visit

## 2024-07-05 ENCOUNTER — Ambulatory Visit
Admission: RE | Admit: 2024-07-05 | Discharge: 2024-07-05 | Disposition: A | Source: Ambulatory Visit | Attending: Nurse Practitioner

## 2024-07-05 ENCOUNTER — Inpatient Hospital Stay: Attending: Internal Medicine

## 2024-07-05 VITALS — BP 141/67 | HR 89 | Temp 97.2°F | Resp 16 | Ht 62.0 in | Wt 154.0 lb

## 2024-07-05 DIAGNOSIS — Z9013 Acquired absence of bilateral breasts and nipples: Secondary | ICD-10-CM | POA: Insufficient documentation

## 2024-07-05 DIAGNOSIS — Z170 Estrogen receptor positive status [ER+]: Secondary | ICD-10-CM | POA: Insufficient documentation

## 2024-07-05 DIAGNOSIS — G8929 Other chronic pain: Secondary | ICD-10-CM

## 2024-07-05 DIAGNOSIS — Z1721 Progesterone receptor positive status: Secondary | ICD-10-CM | POA: Insufficient documentation

## 2024-07-05 DIAGNOSIS — M25562 Pain in left knee: Secondary | ICD-10-CM

## 2024-07-05 DIAGNOSIS — Z79899 Other long term (current) drug therapy: Secondary | ICD-10-CM | POA: Diagnosis not present

## 2024-07-05 DIAGNOSIS — Z79811 Long term (current) use of aromatase inhibitors: Secondary | ICD-10-CM

## 2024-07-05 DIAGNOSIS — C7800 Secondary malignant neoplasm of unspecified lung: Secondary | ICD-10-CM | POA: Insufficient documentation

## 2024-07-05 DIAGNOSIS — Z5181 Encounter for therapeutic drug level monitoring: Secondary | ICD-10-CM | POA: Diagnosis not present

## 2024-07-05 DIAGNOSIS — C50511 Malignant neoplasm of lower-outer quadrant of right female breast: Secondary | ICD-10-CM | POA: Diagnosis not present

## 2024-07-05 DIAGNOSIS — Z86718 Personal history of other venous thrombosis and embolism: Secondary | ICD-10-CM | POA: Insufficient documentation

## 2024-07-05 DIAGNOSIS — Z803 Family history of malignant neoplasm of breast: Secondary | ICD-10-CM | POA: Insufficient documentation

## 2024-07-05 DIAGNOSIS — Z1731 Human epidermal growth factor receptor 2 positive status: Secondary | ICD-10-CM | POA: Diagnosis not present

## 2024-07-05 LAB — CBC WITH DIFFERENTIAL (CANCER CENTER ONLY)
Abs Immature Granulocytes: 0.01 K/uL (ref 0.00–0.07)
Basophils Absolute: 0.1 K/uL (ref 0.0–0.1)
Basophils Relative: 3 %
Eosinophils Absolute: 0.1 K/uL (ref 0.0–0.5)
Eosinophils Relative: 5 %
HCT: 34.9 % — ABNORMAL LOW (ref 36.0–46.0)
Hemoglobin: 11.7 g/dL — ABNORMAL LOW (ref 12.0–15.0)
Immature Granulocytes: 0 %
Lymphocytes Relative: 39 %
Lymphs Abs: 1.1 K/uL (ref 0.7–4.0)
MCH: 33.6 pg (ref 26.0–34.0)
MCHC: 33.5 g/dL (ref 30.0–36.0)
MCV: 100.3 fL — ABNORMAL HIGH (ref 80.0–100.0)
Monocytes Absolute: 0.2 K/uL (ref 0.1–1.0)
Monocytes Relative: 6 %
Neutro Abs: 1.3 K/uL — ABNORMAL LOW (ref 1.7–7.7)
Neutrophils Relative %: 47 %
Platelet Count: 209 K/uL (ref 150–400)
RBC: 3.48 MIL/uL — ABNORMAL LOW (ref 3.87–5.11)
RDW: 13.7 % (ref 11.5–15.5)
Smear Review: NORMAL
WBC Count: 2.7 K/uL — ABNORMAL LOW (ref 4.0–10.5)
nRBC: 0 % (ref 0.0–0.2)

## 2024-07-05 LAB — CMP (CANCER CENTER ONLY)
ALT: 19 U/L (ref 0–44)
AST: 36 U/L (ref 15–41)
Albumin: 3.8 g/dL (ref 3.5–5.0)
Alkaline Phosphatase: 79 U/L (ref 38–126)
Anion gap: 8 (ref 5–15)
BUN: 22 mg/dL (ref 8–23)
CO2: 27 mmol/L (ref 22–32)
Calcium: 10.4 mg/dL — ABNORMAL HIGH (ref 8.9–10.3)
Chloride: 103 mmol/L (ref 98–111)
Creatinine: 1.21 mg/dL — ABNORMAL HIGH (ref 0.44–1.00)
GFR, Estimated: 49 mL/min — ABNORMAL LOW (ref >60)
Glucose, Bld: 85 mg/dL (ref 70–99)
Potassium: 3.6 mmol/L (ref 3.5–5.1)
Sodium: 138 mmol/L (ref 135–145)
Total Bilirubin: 0.9 mg/dL (ref 0.0–1.2)
Total Protein: 7.6 g/dL (ref 6.5–8.1)

## 2024-07-05 LAB — MAGNESIUM: Magnesium: 2.2 mg/dL (ref 1.7–2.4)

## 2024-07-05 NOTE — Progress Notes (Signed)
 [name] OFFICE PROGRESS NOTE  Patient Care Team: Steva Clotilda DEL, NP as PCP - General (Family Medicine) Merrilyn Handler, MD (Inactive) (General Surgery) Curlene Agent, MD (Obstetrics and Gynecology) Lenn Aran, MD Wilder Pink, MD (Unknown Physician Specialty) Rennie Cindy SAUNDERS, MD as Consulting Physician (Oncology)   Cancer Staging  Carcinoma of lower-outer quadrant of right breast in female, estrogen receptor positive [facility]) Staging form: Breast, AJCC 7th Edition - Pathologic: ypT1c,ypN2a, MX - Signed by Merrilyn Handler PARAS, MD on 03/10/2012 Cancer stage: ypT1c,ypN2a, MX  Oncology History Overview Note  # DEC 2012- RIGHT BREAST CA T2 N2 M0 tumor from biopsy.  Estrogen receptor positive, Progesterone receptor positive.  Current receptor negative by FISH 2. Neoadjuvant chemotherapy started in December of 28 with Cytoxan Adriamycin 3. Started on Taxol weekly chemotherapy. 4. Patient finished 12  cycles of Taxol chemotherapy in May of 2013.     5. Status post right modified radical mastectomy [Dr.Bowers; [place]] June of 2013, ypT1c  yp N2  MO. started also on Lerazole    7. Radiation therapy to the right breast (September of 2013).  Lymph node was positive for HER-2 receptor gene amplification of 2.22.  Will proceed with Herceptin treatment starting in September of 2013.   8.Patient has finished Herceptin (maintenance therapy) in August of 2014 8. Start patient on letrozole  from November, 2013. 9. Patient started on Herceptin in September 2013.    10Patient finished 12 months of Herceptin therapy on August, 2014  # 6. Status post left side prophylactic mastectomy.  #Late MAY 2018-RECURRENCE BREAST CA- ER positive/PR negative; ?? HER-2/neu- [biopsy- proven-mediastinal lymph node; in suff for her 2 testing].  [elevated Tumor marker- CT/PET- uptake in Right Mediastinal LN; Sternum [June 2018 EBUS- Dr.Kasa]   # March 10 2017- faslodex  + Abema; OCT 5th CT-PR  of mediastinal LN [consent]; Discontinued faslodex  sec to insurance issues [last DEC 2023]. JAN 2024- continue Abema + Anastrzole   #August 2020-left chest wall nodule biopsy benign  #August 2019- DVT-right upper extremity; Eliquis ; stop end of March 2021 [repeat ultrasound negative/patient preference]; # JAN 2022-Cirrhosis- ? On CT scan-FEB liver- Negative fro overt cirrhosis --------------------------------------------------------------- -    DIAGNOSIS: [ BREAST CANCER- ER/PR/HER2 NEU POS  STAGE:  4   ;GOALS: PALLIATIVE     Carcinoma of lower-outer quadrant of right breast in female, estrogen receptor positive (HCC)   INTERVAL HISTORY: Alone.  Ambulating independently.  [name] 68 y.o.  female pleasant patient above history of metastatic ER PR positive HER-2/neu positive breast cancer currently on abema [M-F; no weekends] + Anastrazole who returns to clinic for follow up. She had hip pain on left which radiated down her leg. Has improved but now her left knee is keeping her from being able to ambulate and go to church. She used a wheelchair to get into clinic today and at baseline ambulates w/o aids. [place] about a month ago and was not aware of injury. Rates pain 10/10. Taking ibuprofen several times a day. Has not taken tramadol  d/t preference. Denies fevers or chills. No UTIs. Appetite is good. Fatigued.   Review of Systems  Constitutional:  Positive for malaise/fatigue. Negative for chills, diaphoresis, fever and weight loss.  HENT:  Negative for nosebleeds and sore throat.   Eyes:  Negative for double vision.  Respiratory:  Negative for cough, hemoptysis, sputum production, shortness of breath and wheezing.   Cardiovascular:  Negative for chest pain, palpitations, orthopnea and leg swelling.  Gastrointestinal:  Negative for  abdominal pain, blood in stool, constipation, heartburn, melena, nausea and vomiting.  Musculoskeletal:  Positive for back pain and joint pain.   Neurological:  Negative for dizziness, tingling, focal weakness, weakness and headaches.  Endo/Heme/Allergies:  Does not bruise/bleed easily.  Psychiatric/Behavioral:  Negative for depression. The patient is not nervous/anxious and does not have insomnia.    PAST MEDICAL HISTORY :  Past Medical History:  Diagnosis Date   Arthritis    Cancer of lower-outer quadrant of female breast (HCC) 08/06/2011   RIGHT, chemo and bilateral mastectomies.   High cholesterol    History of kidney stones    Hx of bilateral breast implants    Hypertension    Lung metastases 20118   chemo pills and faslidex shots.   PONV (postoperative nausea and vomiting)    Type II diabetes mellitus (HCC)    fasting 140-150   Vertigo    LAST WEEK   Vertigo 01/2017   PAST SURGICAL HISTORY :   Past Surgical History:  Procedure Laterality Date   BREAST BIOPSY  07/2011, 2020   right   BREAST RECONSTRUCTION  03/07/2012   Procedure: BREAST RECONSTRUCTION;  Surgeon: Alm Sick, MD;  Location: [name] OR;  Service: Plastics;  Laterality: Left;  BREAST RECONSTRUCTION WITH PLACEMENT OF TISSUE EXPANDER TO LEFT BREAST   BREAST RECONSTRUCTION  02/06/2013   CESAREAN SECTION  [id number]   ENDOBRONCHIAL ULTRASOUND N/A 02/24/2017   Procedure: ENDOBRONCHIAL ULTRASOUND;  Surgeon: Isaiah Scrivener, MD;  Location: [facility] ORS;  Service: Cardiopulmonary;  Laterality: N/A;   LATISSIMUS FLAP TO BREAST Right 02/06/2013   Procedure: LATISSIMUS FLAP TO RIGHT BREAST WITH IMPLANT;  Surgeon: Alm Sick, MD;  Location: [name];  Service: Plastics;  Laterality: Right;   MASTECTOMY  03/07/12   modified right; total left   MODIFIED MASTECTOMY  03/07/2012   Procedure: MODIFIED MASTECTOMY;  Surgeon: Sherlean JINNY Laughter, MD;  Location: [facility] OR;  Service: General;  Laterality: Right;   PORTACATH PLACEMENT  07/2011   SCAR REVISION  03/30/2012   Procedure: SCAR REVISION;  Surgeon: Sherlean JINNY Laughter, MD;  Location: [name];  Service: General;   Laterality: Right;  CLOSURE OF MASTECTOMY INCISION   WISDOM TOOTH EXTRACTION     FAMILY HISTORY :   Family History  Problem Relation Age of Onset   Breast cancer Mother    Diabetes Father    SOCIAL HISTORY:   Social History   Tobacco Use   Smoking status: Never   Smokeless tobacco: Never  Vaping Use   Vaping status: Never Used  Substance Use Topics   Alcohol  use: No   Drug use: No   ALLERGIES:  is allergic to sulfa antibiotics.  MEDICATIONS:  Current Outpatient Medications  Medication Sig Dispense Refill   abemaciclib  (VERZENIO ) 100 MG tablet Take 1 tablet (100 mg total) by mouth 2 (two) times daily. 56 tablet 1   anastrozole  (ARIMIDEX ) 1 MG tablet Take 1 tablet (1 mg total) by mouth daily. 90 tablet 2   atorvastatin  (LIPITOR) 20 MG tablet Take 1 tablet (20 mg total) by mouth daily. 90 tablet 3   carvedilol  (COREG ) 3.125 MG tablet Take 1 tablet (3.125 mg total) by mouth 2 (two) times daily with a meal. 120 tablet 3   Cholecalciferol  50 MCG (2000 UT) CAPS Take 2,000 Units by mouth daily.     clotrimazole -betamethasone  (LOTRISONE ) cream Apply topically daily as needed. 30 g 0   Cranberry-Vitamin C-Probiotic (AZO CRANBERRY PO) Take 1 Capful by mouth in the morning  and at bedtime.     cyanocobalamin  (VITAMIN B12) 1000 MCG tablet 1,000 mcg in the morning and at bedtime.     cyclobenzaprine (FLEXERIL) 5 MG tablet Take 1 tablet (5 mg total) by mouth 3 (three) times daily as needed for muscle spasms. 60 tablet 0   FARXIGA 10 MG TABS tablet Take 10 mg by mouth daily.     glipiZIDE  (GLUCOTROL  XL) 10 MG 24 hr tablet Take 1 tablet (10 mg total) by mouth 2 (two) times daily. 180 tablet 3   insulin  aspart (NOVOLOG  FLEXPEN) 100 UNIT/ML FlexPen Inject 4 Units into the skin 3 (three) times daily with meals. 15 mL 0   levothyroxine  (SYNTHROID ) 25 MCG tablet Take 1 tablet (25 mcg total) by mouth daily before breakfast. 90 tablet 1   loperamide  (IMODIUM  A-D) 2 MG tablet Take 2 mg by mouth 4 (four)  times daily as needed for diarrhea or loose stools. (Patient not taking: Reported on 06/05/2024)     magnesium  oxide (MAG-OX) 400 MG tablet Take 1 tablet by mouth 2 (two) times daily.     Melatonin 10 MG TABS Take 1 tablet by mouth at bedtime as needed.     Olopatadine  HCl 0.2 % SOLN Apply 1 drop to eye daily as needed.     ondansetron  (ZOFRAN -ODT) 4 MG disintegrating tablet Take 1 tablet (4 mg total) by mouth every 8 (eight) hours as needed for nausea or vomiting. 30 tablet 3   potassium chloride  (KLOR-CON  M) 10 MEQ tablet Take 1 tablet (10 mEq total) by mouth daily. 7 tablet 0   promethazine  (PHENERGAN ) 25 MG tablet Take 1 tablet (25 mg total) by mouth every 8 (eight) hours as needed for nausea or vomiting. 15 tablet 0   traMADol  (ULTRAM ) 50 MG tablet Take 1 tablet (50 mg total) by mouth every 8 (eight) hours as needed. (Patient not taking: Reported on 06/05/2024) 60 tablet 0   No current facility-administered medications for this visit.    PHYSICAL EXAMINATION: ECOG PERFORMANCE STATUS: 1 - Symptomatic but completely ambulatory  BP (!) 141/67 (BP Location: Left Arm, Patient Position: Sitting)   Pulse 89   Temp (!) 97.2 F (36.2 C) (Tympanic)   Resp 16   Ht 5' 2 (1.575 m)   Wt 154 lb (69.9 kg)   BMI 28.17 kg/m   Filed Weights   07/05/24 0940  Weight: 154 lb (69.9 kg)   Physical Exam Vitals reviewed.  Constitutional:      Appearance: She is not ill-appearing.     Comments: She is alone.   HENT:     Head: Normocephalic and atraumatic.     Mouth/Throat:     Pharynx: No oropharyngeal exudate.  Cardiovascular:     Rate and Rhythm: Normal rate and regular rhythm.  Pulmonary:     Effort: Pulmonary effort is normal. No respiratory distress.     Breath sounds: No wheezing.  Chest:     Comments: Previous anterior chest wall/left parasternal nodule is no longer palpable.  Abdominal:     General: There is no distension.     Palpations: Abdomen is soft.     Tenderness: There is no  abdominal tenderness. There is no guarding.  Musculoskeletal:        General: No deformity.     Left hip: No deformity, tenderness or bony tenderness (endorses bony tenderness upon weight bearing that ardiates down outside of leg to knee).     Left upper leg: Bony tenderness present. No swelling,  edema, deformity or tenderness.     Left knee: Bony tenderness present. No swelling, deformity, effusion or erythema. Tenderness present.     Comments: Limping. Reports pain in top, outside of left knee when weight bearing  Skin:    General: Skin is warm.     Coloration: Skin is not pale.  Neurological:     Mental Status: She is alert and oriented to person, place, and time.  Psychiatric:        Mood and Affect: Mood and affect normal.        Behavior: Behavior normal.    LABORATORY DATA:  I have reviewed the data as listed    Component Value Date/Time   NA 134 (L) 06/05/2024 1010   NA 135 11/28/2014 1401   K 3.3 (L) 06/05/2024 1010   K 4.7 11/28/2014 1401   CL 101 06/05/2024 1010   CL 99 (L) 11/28/2014 1401   CO2 22 06/05/2024 1010   CO2 28 11/28/2014 1401   GLUCOSE 77 06/05/2024 1010   GLUCOSE 215 (H) 11/28/2014 1401   BUN 13 06/05/2024 1010   BUN 16 11/28/2014 1401   CREATININE 1.21 (H) 06/05/2024 1010   CREATININE 0.96 11/28/2014 1401   CALCIUM  10.1 06/05/2024 1010   CALCIUM  9.9 11/28/2014 1401   PROT 7.5 06/05/2024 1010   PROT 7.6 11/28/2014 1401   ALBUMIN 3.9 06/05/2024 1010   ALBUMIN 4.5 11/28/2014 1401   AST 39 06/05/2024 1010   ALT 24 06/05/2024 1010   ALT 28 11/28/2014 1401   ALKPHOS 87 06/05/2024 1010   ALKPHOS 78 11/28/2014 1401   BILITOT 0.9 06/05/2024 1010   GFRNONAA 49 (L) 06/05/2024 1010   GFRNONAA >60 11/28/2014 1401   GFRAA >60 05/30/2020 1022   GFRAA >60 11/28/2014 1401   Lab Results  Component Value Date   WBC 2.7 (L) 07/05/2024   NEUTROABS PENDING 07/05/2024   HGB 11.7 (L) 07/05/2024   HCT 34.9 (L) 07/05/2024   MCV 100.3 (H) 07/05/2024   PLT 209  07/05/2024     Chemistry      Component Value Date/Time   NA 134 (L) 06/05/2024 1010   NA 135 11/28/2014 1401   K 3.3 (L) 06/05/2024 1010   K 4.7 11/28/2014 1401   CL 101 06/05/2024 1010   CL 99 (L) 11/28/2014 1401   CO2 22 06/05/2024 1010   CO2 28 11/28/2014 1401   BUN 13 06/05/2024 1010   BUN 16 11/28/2014 1401   CREATININE 1.21 (H) 06/05/2024 1010   CREATININE 0.96 11/28/2014 1401      Component Value Date/Time   CALCIUM  10.1 06/05/2024 1010   CALCIUM  9.9 11/28/2014 1401   ALKPHOS 87 06/05/2024 1010   ALKPHOS 78 11/28/2014 1401   AST 39 06/05/2024 1010   ALT 24 06/05/2024 1010   ALT 28 11/28/2014 1401   BILITOT 0.9 06/05/2024 1010      RADIOGRAPHIC STUDIES: I have personally reviewed the radiological images as listed and agreed with the findings in the report.  05/02/24- CT Chest Abdomen Pelvis W Contrast EXAM: CT CHEST, ABDOMEN, AND PELVIS WITH CONTRAST   TECHNIQUE: Multidetector CT imaging of the chest, abdomen and pelvis was performed following the standard protocol during bolus administration of intravenous contrast.   RADIATION DOSE REDUCTION: This exam was performed according to the departmental dose-optimization program which includes automated exposure control, adjustment of the mA and/or kV according to patient size and/or use of iterative reconstruction technique.    CONTRAST:  ISOVUE -300  IOPAMIDOL  (ISOVUE -300) INJECTION 61% additional oral enteric contrast   COMPARISON:  10/13/2023   FINDINGS: CT CHEST FINDINGS   Cardiovascular: Aortic atherosclerosis. Normal heart size. Left coronary artery calcifications. No pericardial effusion.   Mediastinum/Nodes: No enlarged mediastinal, hilar, or axillary lymph nodes. Thyroid  gland, trachea, and esophagus demonstrate no significant findings.   Lungs/Pleura: Subpleural radiation fibrosis of the anterior right lung. Dependent bibasilar scarring or atelectasis. No pleural effusion or pneumothorax.    Musculoskeletal: Status post bilateral mastectomy with implant reconstruction. No acute osseous findings.   CT ABDOMEN PELVIS FINDINGS   Hepatobiliary: No solid liver abnormality is seen. Small gallstones. No gallbladder wall thickening, or biliary dilatation.   Pancreas: Unremarkable. No pancreatic ductal dilatation or surrounding inflammatory changes.   Spleen: Normal in size without significant abnormality.   Adrenals/Urinary Tract: Adrenal glands are unremarkable. Punctuate nonobstructive calculus of the inferior pole of the left kidney. No right-sided calculi, ureteral calculi, or hydronephrosis. Simple benign left renal cortical cysts for which no further follow-up or characterization is required. Bladder is unremarkable.   Stomach/Bowel: Stomach is within normal limits. Appendix appears normal. No evidence of bowel wall thickening, distention, or inflammatory changes. Sigmoid diverticulosis. Moderate burden of stool throughout the colon and rectum.   Vascular/Lymphatic: Aortic atherosclerosis. Splenorenal varices in the left hemiabdomen. No enlarged abdominal or pelvic lymph nodes.   Reproductive: No mass or other abnormality.   Other: No abdominal wall hernia or abnormality. No ascites.   Musculoskeletal: No acute osseous findings.   IMPRESSION: 1. Status post bilateral mastectomy with implant reconstruction. 2. No evidence of lymphadenopathy or metastatic disease in the chest, abdomen, or pelvis. 3. Subpleural radiation fibrosis of the anterior right lung. 4. Cholelithiasis. 5. Punctuate nonobstructive left nephrolithiasis. 6. Sigmoid diverticulosis without evidence of acute diverticulitis. 7. Coronary artery disease.   Aortic Atherosclerosis (ICD10-I70.0).   Electronically Signed   By: Marolyn JONETTA Jaksch M.D.   On: 05/29/2024 10:50  No results found.   ASSESSMENT & PLAN:   Carcinoma of lower-outer quadrant of right breast in female, estrogen receptor positive  #  Recurrent breast cancer-ER positive PR negative ? HER-2/neu-currently on abema + Anastrozole .    # FEB 2025- Stable postsurgical changes from breast reconstruction and right axillary dissection. Stable areas of scarring and fibrotic changes seen of the lungs. No developing new mass lesion, fluid collection or nodal enlargement.   # CT C/A/P from 05/29/24 no evidence of progressive disease. CA 27-29 fluctuates but has generally been stable in the 20s and 30s. No apparent uptrend. Clinically asymptomatic of progressive disease. Today CA-27.29 was 34.9. Today's result pending. Previously palpated nodule now non-palpable. Continue anastrozole  (mid January 2024) and Verzenio  100 mg twice a day Monday through Friday.  If ongoing issues with infection and/or severe neutropenia, recommend lowering dose to 50 mg twice a day.   # Intermittent Hypercalcemia- HOLD off ca+vit D- improved- monitor for now.    # LFTs- elevated- x2-3; Normal bilirubin- [possibly Tylenol  versus Verzenio ]-resolved.  Monitor closely on Verzinio   # Right Knee pain- [Emerge Otho]; will start tramadol ; Previously held tylenol  d/t abnormal LFTs. Taking ibuprofen but kidney function worse. Recommend rotating tylenol  and ibuprofen. Ok to take tramadol . Can trial topical voltaren. Will get xray today. May need MRI to evaluate for soft tissue damage. Referred to [name] for evaluation.    # CKD stage II- stable.    # Hypomagnesia: 2.0.  Continue over-the-counter magnesium     # DM- s/p endocrinology evaluation; with diet and medications [trucilicity; Novolog ]-  stable.   # Vulvovaginal atrophy- Declined uro-gyn. Using clotrimazole -betamethasone . Discussed she is likely having thinning of vulvar tissue secondary to low estrogen and chronic steroid use. Would recommend vaginal moisturizers. Refilled lotrisone  at her request.   # Frequent UTI- declined uro-gyn. None recently. Would recommend topical estrogen to urethra +/-  suppressive therapy if recurrent symptoms.   # Osteopenia- no recent bone density on file. Discuss at next visit.    # Low B 12 [PCP]- on B12 PO. Stable.   # Flu shot today- 06/02/23.    # IV access; PIV.    Q14m: PREFERS AM appt- NOT Fridays   Mychart-  DISPOSITION: PREFERS AM appt- NOT Fridays Dg left knee- consider mri if neg Ref ortho 1 month- labs (cbc, cmp, mag, ca27.29), Dr Rennie- la   No problem-specific Assessment & Plan notes found for this encounter.  No orders of the defined types were placed in this encounter.  All questions were answered. The patient knows to call the clinic with any problems, questions or concerns.    Tinnie KANDICE Dawn, NP 07/05/2024

## 2024-07-06 ENCOUNTER — Encounter: Payer: Self-pay | Admitting: Internal Medicine

## 2024-07-06 ENCOUNTER — Ambulatory Visit (INDEPENDENT_AMBULATORY_CARE_PROVIDER_SITE_OTHER)

## 2024-07-06 ENCOUNTER — Ambulatory Visit

## 2024-07-06 ENCOUNTER — Other Ambulatory Visit (HOSPITAL_COMMUNITY): Payer: Self-pay

## 2024-07-06 ENCOUNTER — Telehealth: Payer: Self-pay | Admitting: Pharmacy Technician

## 2024-07-06 DIAGNOSIS — M25562 Pain in left knee: Secondary | ICD-10-CM | POA: Diagnosis not present

## 2024-07-06 DIAGNOSIS — M25559 Pain in unspecified hip: Secondary | ICD-10-CM | POA: Diagnosis not present

## 2024-07-06 DIAGNOSIS — M25552 Pain in left hip: Secondary | ICD-10-CM | POA: Diagnosis not present

## 2024-07-06 DIAGNOSIS — S76112A Strain of left quadriceps muscle, fascia and tendon, initial encounter: Secondary | ICD-10-CM | POA: Diagnosis not present

## 2024-07-06 DIAGNOSIS — M7632 Iliotibial band syndrome, left leg: Secondary | ICD-10-CM

## 2024-07-06 LAB — CANCER ANTIGEN 27.29: CA 27.29: 40.2 U/mL — ABNORMAL HIGH (ref 0.0–38.6)

## 2024-07-06 NOTE — Progress Notes (Signed)
 Orthopaedic Surgery New Patient Visit   History of Present Illness: The patient is a 68 y.o. female seen in clinic for 1.5 week history of left lower extremity pain.  Patient states that she was at the cancer center, tripped and fell, one month ago.  Sustained abrasion to anterior aspect of left knee.  Patient states she had pain over the inferior aspect of the patella.  Patient felt okay/asymptomatic for 2 weeks, then developed anterior thigh and lateral thigh pain (06/26/2024).  No new injury/trauma.  Describes as catching sensation.  Associated muscle spasm.  Reports associated instability and occasional buckling/giving out sensation.  Patient has noticed that she has left leg weakness; unable to reach with right arm to top shelf in refrigerator and to closing garage door.  Pain with getting up onto riding lawnmower.  Has taken over-the-counter Tylenol  and ibuprofen with minimal symptom improvement.  Patient has prescribed Flexeril and tramadol  which she has taken for some symptom relief.  Patient is currently retired. No history of LLE issues. Patient with peripheral neuropathy from chemotherapy medication and diabetes; present in bilateral 1st/2nd fingers and bilateral great toes.  Previously prescribed Gabapentin, but was discontinued due to side effects.  Patient with DM2. Uses insulin . Last A1c 10.3% on 12/08/2023, 7 months ago.  BUN 22, Cr 1.21, and eGFR 49.   Patient managed by [facility] for recurrent breast cancer. Currently on Abema and Anastrozole . CT of chest, abdomen, and pelvis from 05/29/2024 revealed no evidence of progressive disease. Last seen yesterday, 07/05/2024. Reported right knee pain. Prescribed Tramadol  and use topical Voltaren, and advised to f/u with orthopedist.  Patient with past 3-4 month has had increase of cancer markers, which has caused some anxiety.  Patient previously seen by Dorn Doyne PA-C with [facility] in January  2024 for pain in right knee x 18 months. Xrays revealed the right knee with a right medial femoral condyle calcification with loos body. Treated with Voltaren gel, prn Advil and Tylenol , knee brace, and recommended f/u to discuss removal if no improvement with conservative measures.  Patient in August 2019 had right upper extremity DVT. Managed with Eliquis . Discontinued use of Eliquis  in March 2021. Not currently on an anticoagulant.   Past Medical, Social and Family History: Past Medical History:  Diagnosis Date   Arthritis    Cancer of lower-outer quadrant of female breast (HCC) 08/06/2011   RIGHT, chemo and bilateral mastectomies.   High cholesterol    History of kidney stones    Hx of bilateral breast implants    Hypertension    Lung metastases 20118   chemo pills and faslidex shots.   PONV (postoperative nausea and vomiting)    Type II diabetes mellitus (HCC)    fasting 140-150   Vertigo    LAST WEEK   Vertigo 01/2017   Past Surgical History:  Procedure Laterality Date   BREAST BIOPSY  07/2011, 2020   right   BREAST RECONSTRUCTION  03/07/2012   Procedure: BREAST RECONSTRUCTION;  Surgeon: Alm Sick, MD;  Location: [facility] OR;  Service: Plastics;  Laterality: Left;  BREAST RECONSTRUCTION WITH PLACEMENT OF TISSUE EXPANDER TO LEFT BREAST   BREAST RECONSTRUCTION  02/06/2013   CESAREAN SECTION  8003,8002   ENDOBRONCHIAL ULTRASOUND N/A 02/24/2017   Procedure: ENDOBRONCHIAL ULTRASOUND;  Surgeon: Isaiah Scrivener, MD;  Location: [facility] ORS;  Service: Cardiopulmonary;  Laterality: N/A;   LATISSIMUS FLAP TO BREAST Right 02/06/2013   Procedure: LATISSIMUS FLAP TO RIGHT BREAST WITH IMPLANT;  Surgeon: Alm Sick, MD;  Location: [facility] OR;  Service: Plastics;  Laterality: Right;   MASTECTOMY  03/07/12   modified right; total left   MODIFIED MASTECTOMY  03/07/2012   Procedure: MODIFIED MASTECTOMY;  Surgeon: Sherlean JINNY Laughter, MD;  Location: [facility] OR;  Service: General;  Laterality: Right;   PORTACATH  PLACEMENT  07/2011   SCAR REVISION  03/30/2012   Procedure: SCAR REVISION;  Surgeon: Sherlean JINNY Laughter, MD;  Location: [facility];  Service: General;  Laterality: Right;  CLOSURE OF MASTECTOMY INCISION   WISDOM TOOTH EXTRACTION     Allergies  Allergen Reactions   Sulfa Antibiotics Rash   Current Outpatient Medications on File Prior to Visit  Medication Sig Dispense Refill   abemaciclib  (VERZENIO ) 100 MG tablet Take 1 tablet (100 mg total) by mouth 2 (two) times daily. 56 tablet 1   anastrozole  (ARIMIDEX ) 1 MG tablet Take 1 tablet (1 mg total) by mouth daily. 90 tablet 2   atorvastatin  (LIPITOR) 20 MG tablet Take 1 tablet (20 mg total) by mouth daily. 90 tablet 3   carvedilol  (COREG ) 3.125 MG tablet Take 1 tablet (3.125 mg total) by mouth 2 (two) times daily with a meal. 120 tablet 3   Cholecalciferol  50 MCG (2000 UT) CAPS Take 2,000 Units by mouth daily.     clotrimazole -betamethasone  (LOTRISONE ) cream Apply topically daily as needed. 30 g 0   Cranberry-Vitamin C-Probiotic (AZO CRANBERRY PO) Take 1 Capful by mouth in the morning and at bedtime. (Patient not taking: Reported on 07/05/2024)     cyanocobalamin  (VITAMIN B12) 1000 MCG tablet 1,000 mcg in the morning and at bedtime.     cyclobenzaprine (FLEXERIL) 5 MG tablet Take 1 tablet (5 mg total) by mouth 3 (three) times daily as needed for muscle spasms. 60 tablet 0   FARXIGA 10 MG TABS tablet Take 10 mg by mouth daily.     glipiZIDE  (GLUCOTROL  XL) 10 MG 24 hr tablet Take 1 tablet (10 mg total) by mouth 2 (two) times daily. 180 tablet 3   insulin  aspart (NOVOLOG  FLEXPEN) 100 UNIT/ML FlexPen Inject 4 Units into the skin 3 (three) times daily with meals. 15 mL 0   levothyroxine  (SYNTHROID ) 25 MCG tablet Take 1 tablet (25 mcg total) by mouth daily before breakfast. 90 tablet 1   loperamide  (IMODIUM  A-D) 2 MG tablet Take 2 mg by mouth 4 (four) times daily as needed for diarrhea or loose stools.     magnesium  oxide (MAG-OX) 400 MG  tablet Take 1 tablet by mouth 2 (two) times daily.     Melatonin 10 MG TABS Take 1 tablet by mouth at bedtime as needed.     Olopatadine  HCl 0.2 % SOLN Apply 1 drop to eye daily as needed. (Patient not taking: Reported on 07/05/2024)     ondansetron  (ZOFRAN -ODT) 4 MG disintegrating tablet Take 1 tablet (4 mg total) by mouth every 8 (eight) hours as needed for nausea or vomiting. 30 tablet 3   potassium chloride  (KLOR-CON  M) 10 MEQ tablet Take 1 tablet (10 mEq total) by mouth daily. 7 tablet 0   promethazine  (PHENERGAN ) 25 MG tablet Take 1 tablet (25 mg total) by mouth every 8 (eight) hours as needed for nausea or vomiting. 15 tablet 0   traMADol  (ULTRAM ) 50 MG tablet Take 1 tablet (50 mg total) by mouth every 8 (eight) hours as needed. 60 tablet 0   No current facility-administered medications on file prior to visit.   Social History  Tobacco Use   Smoking status: Never   Smokeless tobacco: Never  Vaping Use   Vaping status: Never Used  Substance Use Topics   Alcohol  use: No   Drug use: No      I have reviewed past medical, surgical, social and family history, medications and allergies as documented in the EMR.  Review of Systems - A ROS was performed including pertinent positives and negatives as documented in the HPI.     Physical Exam:  General/Constitutional: NAD Vascular: No edema, swelling or tenderness, except as noted in detailed exam Integumentary: No impressive skin lesions present, except as noted in detailed exam Neuro/Psych: Normal mood and affect, oriented to person, place and time Musculoskeletal: Normal, except as noted in detailed exam and in HPI   Focused Orthopaedic Examination:  Hip Examination (focused):   LEFT  ROM (degrees): Flexion-Extension 0-120    Abduction 45   Adduction 25   IR 40   ER 50  Palpation (pain): Anterior Positive Mild tenderness with palpation in groin   Iliopsoas negative   Greater troch Positive Mild   ASIS negative    AIIS negative   Posterior negative   Adductors negative  Special Tests: FADIR negative   FABER negative   Log Roll negative   Ober's negative  Other: Hip flexion strength 4+/5   Hip adductor strength  5/5   Hip abductor strength 4+/5    Knee Examination (focused):   LEFT  AROM (degrees) PROM (degrees)  0-130 0-130  Palpation (pain): Effusion not significant   Medial joint line tenderness positive   Lateral joint line tenderness negative  Instability: Varus @ 0 degrees            @ 30 degrees none none   Valgus @ 0 degrees             @ 30 degrees none none  Special Tests: Lachman's negative   Posterior drawer none   Anterior drawer none   Pivot shift Not performed   McMurray negative   Dial @ 30 degrees         @ 90 degrees Not performed Not performed  Patella: Palpation (pain) negative   Mobility < 2 quadrants   Apprehension negative  Other: Knee flexion strength  5/5   Knee extension strength  4+/5   Patient able to straight leg raise without difficulty, unable to perform unassisted squat secondary to weakness.  Focal areas of tenderness to palpation: Moderate tenderness with palpation over mid left quad musculature and left IT band.    Vascular/Lymphatic: 2+ dorsalis pedis/posterior tibialis pulses,  foot warm and well perfused Neurologic: Sensation intact to light touch to Superficial peroneal/Deep peroneal/Tibial/Sural/Saphenous nerves     XR Left Knee Imaging: X-rays of the Left Knee including 2-views (AP, lateral) obtained yesterday 07/05/2024 at [facility] and 1-view (sunrise) obtained today 07/06/2024 at [facility] were reviewed personally by me.  Per my independent interpretation these images show mild medial joint space loss. No acute fracture. No dislocation. Chronic vascular atherosclerosis/calcification seen. Benign appearing lesion noted about the distal femur medullary canal  XR Pelvis  Imaging: X-rays of the pelvis including 1-view (AP) obtained today 07/06/2024 at [facility] were reviewed personally by me.  Per my independent interpretation these images show mild to moderate osteoarthritis changes diffusely; present at SI joints and bilateral hip joints (left worse than right).  Enthesopathy at tendon attachment at left iliac crest and  greater trochanter bilaterally. No acute fracture. No dislocation.    Assessment:  Left quadriceps strain with associated weakness Left leg IT band syndrome   Plan:  Patient was seen and examined in office today. We reviewed patient's history, examination, and imaging in detail. Based on information available for this encounter, patient with 10-day history of left lower extremity pain and weakness.  Located anterior mid thigh and lateral thigh.  Describes as catching and weakness sensation. Radiographs of the pelvis and knee reveal no acute findings that appear to correlate with clinical findings. Patient on physical exam with left quad weakness. Unable to perform unassisted squat and poor definition of musculature.  Also tenderness to palpation over quad musculature and IT band.  Patient antalgic with moving from sitting to lying down position and mild gait abnormality due to left lower extremity pain and weakness.  Discussed with patient suspect her symptoms are primarily from left quad strain/weakness and IT band syndrome.  Recommended conservative management of ice/heat, topical analgesics, over-the-counter Tylenol /ibuprofen as needed, and stretching/strengthening exercises.  Placed referral to formal physical therapy, with plan to transition to home exercise program.  Discussed importance of PT to also help improve balance and prevent falls.  At this time, advised against routine NSAID usage (patient with Stage 3A CKD) and will not offer corticosteroid injection to tronchanteric bursa (last A1c >10%).  Patient  agrees with plan and will return to clinic in 6 to 8 weeks for reevaluation, sooner with any new or worsening symptoms.   Patient education material was provided.  All questions, concerns and comments were addressed to the best of my ability.  Follow-up: 6-8 weeks for re-evaluation, sooner if any new/worsening symptoms or concerns   Arlyss GEANNIE Schneider, DO Orthopedic Surgery & Sports Medicine [place]   This document was dictated using Dragon voice recognition software. A reasonable attempt at proof reading has been made to minimize errors.

## 2024-07-06 NOTE — Telephone Encounter (Signed)
 Oral Oncology Patient Advocate Encounter  Was successful in securing patient a $7500 grant from [facility] to provide copayment coverage for Verzenio .  This will keep the out of pocket expense at $0.     [employer] ID: [id number]   The billing information is as follows and has been shared with [facility].    RxBin: [id number] PCN: [id number] Member ID: [id number] Group ID: [id number] Dates of Eligibility: 06/05/2024 through 06/04/2025  Fund:  Breast Cancer - Medicare Access  [place] ([place]) Chet Burnet, CPhT  [facility], Zelda Salmon, [facility] - Oral Chemotherapy Patient Advocate Specialist III Phone: [phone]  Fax: [phone]

## 2024-07-06 NOTE — Patient Instructions (Signed)

## 2024-07-09 ENCOUNTER — Ambulatory Visit: Payer: Self-pay | Admitting: Nurse Practitioner

## 2024-07-09 NOTE — Telephone Encounter (Signed)
 Called patient. Discussed results of tumor marker. Dr Rennie recommends monitoring and holding off on imaging for now.

## 2024-07-11 ENCOUNTER — Other Ambulatory Visit: Payer: Self-pay | Admitting: Internal Medicine

## 2024-07-11 ENCOUNTER — Other Ambulatory Visit: Payer: Self-pay

## 2024-07-11 DIAGNOSIS — C50511 Malignant neoplasm of lower-outer quadrant of right female breast: Secondary | ICD-10-CM

## 2024-07-12 ENCOUNTER — Other Ambulatory Visit: Payer: Self-pay

## 2024-07-12 MED ORDER — ABEMACICLIB 100 MG PO TABS
100.0000 mg | ORAL_TABLET | Freq: Two times a day (BID) | ORAL | 1 refills | Status: DC
  Filled 2024-07-12: qty 56, 28d supply, fill #0
  Filled 2024-08-13: qty 56, 28d supply, fill #1

## 2024-07-12 NOTE — Telephone Encounter (Signed)
 Dr rennie patient

## 2024-07-13 ENCOUNTER — Other Ambulatory Visit: Payer: Self-pay

## 2024-07-13 NOTE — Progress Notes (Signed)
 Specialty Pharmacy Refill Coordination Note  Kimberly [name] is a 68 y.o. female contacted today regarding refills of specialty medication(s) Abemaciclib  (VERZENIO )   Patient requested Delivery   Delivery date: 07/24/24   Verified address: [address]  [place] [address]   Medication will be filled on: 07/23/24

## 2024-07-18 DIAGNOSIS — Z794 Long term (current) use of insulin: Secondary | ICD-10-CM | POA: Diagnosis not present

## 2024-07-18 DIAGNOSIS — Z23 Encounter for immunization: Secondary | ICD-10-CM | POA: Diagnosis not present

## 2024-07-18 DIAGNOSIS — M25562 Pain in left knee: Secondary | ICD-10-CM | POA: Diagnosis not present

## 2024-07-18 DIAGNOSIS — E1159 Type 2 diabetes mellitus with other circulatory complications: Secondary | ICD-10-CM | POA: Diagnosis not present

## 2024-07-18 DIAGNOSIS — N182 Chronic kidney disease, stage 2 (mild): Secondary | ICD-10-CM | POA: Diagnosis not present

## 2024-07-18 DIAGNOSIS — E782 Mixed hyperlipidemia: Secondary | ICD-10-CM | POA: Diagnosis not present

## 2024-07-18 DIAGNOSIS — J302 Other seasonal allergic rhinitis: Secondary | ICD-10-CM | POA: Diagnosis not present

## 2024-07-18 DIAGNOSIS — I152 Hypertension secondary to endocrine disorders: Secondary | ICD-10-CM | POA: Diagnosis not present

## 2024-07-18 DIAGNOSIS — Z09 Encounter for follow-up examination after completed treatment for conditions other than malignant neoplasm: Secondary | ICD-10-CM | POA: Diagnosis not present

## 2024-07-18 DIAGNOSIS — Z1331 Encounter for screening for depression: Secondary | ICD-10-CM | POA: Diagnosis not present

## 2024-07-18 DIAGNOSIS — E1169 Type 2 diabetes mellitus with other specified complication: Secondary | ICD-10-CM | POA: Diagnosis not present

## 2024-07-18 DIAGNOSIS — E1122 Type 2 diabetes mellitus with diabetic chronic kidney disease: Secondary | ICD-10-CM | POA: Diagnosis not present

## 2024-07-25 DIAGNOSIS — N182 Chronic kidney disease, stage 2 (mild): Secondary | ICD-10-CM | POA: Diagnosis not present

## 2024-07-25 DIAGNOSIS — M25562 Pain in left knee: Secondary | ICD-10-CM | POA: Diagnosis not present

## 2024-07-25 DIAGNOSIS — Z794 Long term (current) use of insulin: Secondary | ICD-10-CM | POA: Diagnosis not present

## 2024-07-25 DIAGNOSIS — E1122 Type 2 diabetes mellitus with diabetic chronic kidney disease: Secondary | ICD-10-CM | POA: Diagnosis not present

## 2024-07-30 DIAGNOSIS — M6281 Muscle weakness (generalized): Secondary | ICD-10-CM | POA: Diagnosis not present

## 2024-07-30 DIAGNOSIS — M25562 Pain in left knee: Secondary | ICD-10-CM | POA: Diagnosis not present

## 2024-08-02 DIAGNOSIS — M25562 Pain in left knee: Secondary | ICD-10-CM | POA: Diagnosis not present

## 2024-08-02 DIAGNOSIS — M6281 Muscle weakness (generalized): Secondary | ICD-10-CM | POA: Diagnosis not present

## 2024-08-07 DIAGNOSIS — M6281 Muscle weakness (generalized): Secondary | ICD-10-CM | POA: Diagnosis not present

## 2024-08-07 DIAGNOSIS — M25562 Pain in left knee: Secondary | ICD-10-CM | POA: Diagnosis not present

## 2024-08-08 ENCOUNTER — Inpatient Hospital Stay: Attending: Internal Medicine

## 2024-08-08 ENCOUNTER — Inpatient Hospital Stay: Admitting: Internal Medicine

## 2024-08-08 DIAGNOSIS — I129 Hypertensive chronic kidney disease with stage 1 through stage 4 chronic kidney disease, or unspecified chronic kidney disease: Secondary | ICD-10-CM | POA: Insufficient documentation

## 2024-08-08 DIAGNOSIS — Z9013 Acquired absence of bilateral breasts and nipples: Secondary | ICD-10-CM | POA: Diagnosis not present

## 2024-08-08 DIAGNOSIS — Z170 Estrogen receptor positive status [ER+]: Secondary | ICD-10-CM

## 2024-08-08 DIAGNOSIS — C50511 Malignant neoplasm of lower-outer quadrant of right female breast: Secondary | ICD-10-CM

## 2024-08-08 DIAGNOSIS — M25561 Pain in right knee: Secondary | ICD-10-CM | POA: Insufficient documentation

## 2024-08-08 DIAGNOSIS — Z794 Long term (current) use of insulin: Secondary | ICD-10-CM | POA: Insufficient documentation

## 2024-08-08 DIAGNOSIS — E1122 Type 2 diabetes mellitus with diabetic chronic kidney disease: Secondary | ICD-10-CM | POA: Insufficient documentation

## 2024-08-08 DIAGNOSIS — Z1721 Progesterone receptor positive status: Secondary | ICD-10-CM | POA: Insufficient documentation

## 2024-08-08 DIAGNOSIS — Z7984 Long term (current) use of oral hypoglycemic drugs: Secondary | ICD-10-CM | POA: Diagnosis not present

## 2024-08-08 DIAGNOSIS — C7800 Secondary malignant neoplasm of unspecified lung: Secondary | ICD-10-CM | POA: Diagnosis present

## 2024-08-08 DIAGNOSIS — Z79899 Other long term (current) drug therapy: Secondary | ICD-10-CM | POA: Insufficient documentation

## 2024-08-08 DIAGNOSIS — Z1731 Human epidermal growth factor receptor 2 positive status: Secondary | ICD-10-CM | POA: Insufficient documentation

## 2024-08-08 DIAGNOSIS — Z79811 Long term (current) use of aromatase inhibitors: Secondary | ICD-10-CM | POA: Diagnosis not present

## 2024-08-08 DIAGNOSIS — Z86718 Personal history of other venous thrombosis and embolism: Secondary | ICD-10-CM | POA: Insufficient documentation

## 2024-08-08 DIAGNOSIS — N183 Chronic kidney disease, stage 3 unspecified: Secondary | ICD-10-CM | POA: Insufficient documentation

## 2024-08-08 LAB — CMP (CANCER CENTER ONLY)
ALT: 39 U/L (ref 0–44)
AST: 44 U/L — ABNORMAL HIGH (ref 15–41)
Albumin: 4.2 g/dL (ref 3.5–5.0)
Alkaline Phosphatase: 95 U/L (ref 38–126)
Anion gap: 12 (ref 5–15)
BUN: 16 mg/dL (ref 8–23)
CO2: 25 mmol/L (ref 22–32)
Calcium: 10.6 mg/dL — ABNORMAL HIGH (ref 8.9–10.3)
Chloride: 104 mmol/L (ref 98–111)
Creatinine: 1.13 mg/dL — ABNORMAL HIGH (ref 0.44–1.00)
GFR, Estimated: 52 mL/min — ABNORMAL LOW (ref >60)
Glucose, Bld: 105 mg/dL — ABNORMAL HIGH (ref 70–99)
Potassium: 3.7 mmol/L (ref 3.5–5.1)
Sodium: 140 mmol/L (ref 135–145)
Total Bilirubin: 0.6 mg/dL (ref 0.0–1.2)
Total Protein: 7.4 g/dL (ref 6.5–8.1)

## 2024-08-08 LAB — CBC WITH DIFFERENTIAL (CANCER CENTER ONLY)
Abs Immature Granulocytes: 0.01 K/uL (ref 0.00–0.07)
Basophils Absolute: 0.1 K/uL (ref 0.0–0.1)
Basophils Relative: 2 %
Eosinophils Absolute: 0 K/uL (ref 0.0–0.5)
Eosinophils Relative: 1 %
HCT: 32.4 % — ABNORMAL LOW (ref 36.0–46.0)
Hemoglobin: 11.1 g/dL — ABNORMAL LOW (ref 12.0–15.0)
Immature Granulocytes: 0 %
Lymphocytes Relative: 43 %
Lymphs Abs: 1.1 K/uL (ref 0.7–4.0)
MCH: 35 pg — ABNORMAL HIGH (ref 26.0–34.0)
MCHC: 34.3 g/dL (ref 30.0–36.0)
MCV: 102.2 fL — ABNORMAL HIGH (ref 80.0–100.0)
Monocytes Absolute: 0.1 K/uL (ref 0.1–1.0)
Monocytes Relative: 5 %
Neutro Abs: 1.2 K/uL — ABNORMAL LOW (ref 1.7–7.7)
Neutrophils Relative %: 49 %
Platelet Count: 191 K/uL (ref 150–400)
RBC: 3.17 MIL/uL — ABNORMAL LOW (ref 3.87–5.11)
RDW: 13.8 % (ref 11.5–15.5)
WBC Count: 2.6 K/uL — ABNORMAL LOW (ref 4.0–10.5)
nRBC: 0 % (ref 0.0–0.2)

## 2024-08-08 LAB — MAGNESIUM: Magnesium: 1.9 mg/dL (ref 1.7–2.4)

## 2024-08-08 MED ORDER — PROMETHAZINE HCL 25 MG PO TABS: 25.0000 mg | ORAL_TABLET | Freq: Three times a day (TID) | ORAL | 0 refills | Status: AC | PRN

## 2024-08-08 NOTE — Assessment & Plan Note (Addendum)
#   Recurrent breast cancer-ER positive PR negative ? HER-2/neu-currently on abema+ Anastrozole .   # SEP 30th, 2025- CT CAP- . Status post bilateral mastectomy with implant reconstruction; No evidence of lymphadenopathy or metastatic disease in the chest, abdomen, or pelvis; Subpleural radiation fibrosis of the anterior right lung. Will repeat PET scan given rising cancer marker; and also get Tempus blood test.   # Continue Anastrazole [JAN Mid 2024-]-given improvement of LFTs/and neutropenia-- CONTINUE Verzenio - 100 mg twice daily Monday through Friday- if on going issues with infection/severe neutropenia- recommend lower dose 50 mg- BID.   # Intermittent Hypercalcemia- HOLD off ca+vit D- improved- monitor for now.   # Right Knee pain-  [Emerge Otho]; s/p steroid injection-  stable.   # CKD stage III- stable.   # Hypomagnesia: 1.4- on OTC mag-   stable.  # DM- s/p endocrinology evaluation; with diet and medications [trucilicity; Novolog ]-    stable.  # recurrent UTI/ ? Suprapubic tenderness- S/p eval with urology.  Monitor closely on Abema/with neutropenia- s/p evaluation with Tinnie Dawn re: vaginal atrophy today-    stable.  #Incidental findings on Imaging  CT , 2025:Cholelithiasis; Punctuate nonobstructive left nephrolithiasis; Sigmoid diverticulosis without evidence of acute diverticulitis; Coronary artery diseaseI reviewed/discussed/counseled the patient.   # Low B 12 [PCP]- on B12 PO.   stable.  # Flu shot - 2025  # IV access; PIV.   Q1m; :PREFERS AM appt- NOT Fridays  Mychart- # DISPOSITION: PREFERS AM appt- NOT Fridays- PTH added # Follow-up  1 month--MD-  labs CBC CMP; magnesium ; PET scan; Tempus liquid biopsy- Dr. B  # I reviewed the blood work- with the patient in detail; also reviewed the imaging independently [as summarized above]; and with the patient in detail.

## 2024-08-08 NOTE — Progress Notes (Signed)
 Needs refill on phenergan , pended.

## 2024-08-08 NOTE — Progress Notes (Signed)
 [name]  Patient Care Team: Steva Clotilda DEL, NP as PCP - General (Family Medicine) Merrilyn Handler, MD (Inactive) (General Surgery) Curlene Agent, MD (Obstetrics and Gynecology) Lenn Aran, MD Wilder Pink, MD (Unknown Physician Specialty) Rennie Cindy SAUNDERS, MD as Consulting Physician (Oncology)   Cancer Staging  Carcinoma of lower-outer quadrant of right breast in female, estrogen receptor positive [facility]) Staging form: Breast, AJCC 7th Edition - Pathologic: ypT1c,ypN2a, MX - Signed by Merrilyn Handler PARAS, MD on 03/10/2012 Cancer stage: ypT1c,ypN2a, MX    Oncology History Overview Note  # DEC 2012- RIGHT BREAST CA T2 N2 M0 tumor from biopsy.  Estrogen receptor positive, Progesterone receptor positive.  Current receptor negative by FISH 2. Neoadjuvant chemotherapy started in December of 28 with Cytoxan Adriamycin 3. Started on Taxol weekly chemotherapy. 4. Patient finished 12  cycles of Taxol chemotherapy in May of 2013.     5. Status post right modified radical mastectomy [Dr.Bowers; [place]] June of 2013, ypT1c  yp N2  MO. started also on Lerazole    7. Radiation therapy to the right breast (September of 2013).  Lymph node was positive for HER-2 receptor gene amplification of 2.22.  Will proceed with Herceptin treatment starting in September of 2013.   8.Patient has finished Herceptin (maintenance therapy) in August of 2014 8. Start patient on letrozole  from November, 2013. 9. Patient started on Herceptin in September 2013.    10Patient finished 12 months of Herceptin therapy on August, 2014  # 6. Status post left side prophylactic mastectomy.  #Late MAY 2018-RECURRENCE BREAST CA- ER positive/PR negative; ?? HER-2/neu- [biopsy- proven-mediastinal lymph node; in suff for her 2 testing].  [elevated Tumor marker- CT/PET- uptake in Right Mediastinal LN; Sternum [June 2018 EBUS- Dr.Kasa]   # March 10 2017- faslodex  + Abema; OCT 5th  CT-PR of mediastinal LN [consent]; Discontinued faslodex  sec to insurance issues [last DEC 2023]. JAN 2024- continue Abema + Anastrzole   #August 2020-left chest wall nodule biopsy benign  #August 2019- DVT-right upper extremity; Eliquis ; stop end of March 2021 [repeat ultrasound negative/patient preference]; # JAN 2022-Cirrhosis- ? On CT scan-FEB liver- Negative fro overt cirrhosis --------------------------------------------------------------- -    DIAGNOSIS: [ BREAST CANCER- ER/PR/HER2 NEU POS  STAGE:  4   ;GOALS: PALLIATIVE     Carcinoma of lower-outer quadrant of right breast in female, estrogen receptor positive (HCC)   INTERVAL HISTORY: Alone.  Ambulating independently.  Kimberly [name] 68 y.o.  female pleasant patient above history of metastatic ER PR positive HER-2/neu positive breast cancer currently on abema [M-F; no weekends]+ Anastrazole  is here for follow-up/ and CT scan.   Discussed the use of AI scribe software for clinical note transcription with the patient, who gave verbal consent to proceed.  History of Present Illness   Kimberly [name] is a 68 year old female with ER-positive carcinoma of the right breast who presents for oncology follow-up due to persistently elevated tumor markers.  She has had persistently elevated tumor markers over the last three measurements, with values ranging from 40.2 to 40.8. She is concerned that her tumor markers have not decreased, noting that previously they have fluctuated but typically returned to baseline after several months. A CT scan in October 2025 showed no new or concerning findings. She last had a PET scan in 2018 and is scheduled for a repeat PET scan in January. She denies new or enlarging breast masses, headaches, or other new symptoms.  She continues Verzenio  (abemaciclib ) twice daily,  Monday through Friday, and omits doses on weekends. Recent calcium  levels have ranged between 10.4 and 10.8. Blood counts have  been mildly decreased but not significantly abnormal, and she remains asymptomatic from leukopenia with no recent infections.  She experiences intermittent nausea and requested a new prescription for Phenergan , as her previous supply expired. She denies other gastrointestinal symptoms. She received a flu vaccination in October 2025 and has had no recent infections.  At a primary care visit in November 2025, mild transaminitis was noted, likely related to increased acetaminophen  and ibuprofen use for knee pain. She has since discontinued these medications and received a cortisone injection, resulting in improved knee symptoms. She is participating in physical therapy for knee rehabilitation and reports improved knee stability.       Review of Systems  Constitutional:  Positive for malaise/fatigue. Negative for chills, diaphoresis, fever and weight loss.  HENT:  Negative for nosebleeds and sore throat.   Eyes:  Negative for double vision.  Respiratory:  Negative for cough, hemoptysis, sputum production, shortness of breath and wheezing.   Cardiovascular:  Negative for chest pain, palpitations, orthopnea and leg swelling.  Gastrointestinal:  Negative for abdominal pain, blood in stool, constipation, heartburn, melena, nausea and vomiting.  Musculoskeletal:  Positive for back pain and joint pain.  Neurological:  Negative for dizziness, tingling, focal weakness, weakness and headaches.  Endo/Heme/Allergies:  Does not bruise/bleed easily.  Psychiatric/Behavioral:  Negative for depression. The patient is not nervous/anxious and does not have insomnia.       PAST MEDICAL HISTORY :  Past Medical History:  Diagnosis Date   Arthritis    Cancer of lower-outer quadrant of female breast (HCC) 08/06/2011   RIGHT, chemo and bilateral mastectomies.   High cholesterol    History of kidney stones    Hx of bilateral breast implants    Hypertension    Lung metastases 20118   chemo pills and faslidex  shots.   PONV (postoperative nausea and vomiting)    Type II diabetes mellitus (HCC)    fasting 140-150   Vertigo    LAST WEEK   Vertigo 01/2017    PAST SURGICAL HISTORY :   Past Surgical History:  Procedure Laterality Date   BREAST BIOPSY  07/2011, 2020   right   BREAST RECONSTRUCTION  03/07/2012   Procedure: BREAST RECONSTRUCTION;  Surgeon: Alm Sick, MD;  Location: [facility] OR;  Service: Plastics;  Laterality: Left;  BREAST RECONSTRUCTION WITH PLACEMENT OF TISSUE EXPANDER TO LEFT BREAST   BREAST RECONSTRUCTION  02/06/2013   CESAREAN SECTION  [id number]   ENDOBRONCHIAL ULTRASOUND N/A 02/24/2017   Procedure: ENDOBRONCHIAL ULTRASOUND;  Surgeon: Isaiah Scrivener, MD;  Location: [facility] ORS;  Service: Cardiopulmonary;  Laterality: N/A;   LATISSIMUS FLAP TO BREAST Right 02/06/2013   Procedure: LATISSIMUS FLAP TO RIGHT BREAST WITH IMPLANT;  Surgeon: Alm Sick, MD;  Location: [facility] OR;  Service: Plastics;  Laterality: Right;   MASTECTOMY  03/07/12   modified right; total left   MODIFIED MASTECTOMY  03/07/2012   Procedure: MODIFIED MASTECTOMY;  Surgeon: Sherlean JINNY Laughter, MD;  Location: [facility] OR;  Service: General;  Laterality: Right;   PORTACATH PLACEMENT  07/2011   SCAR REVISION  03/30/2012   Procedure: SCAR REVISION;  Surgeon: Sherlean JINNY Laughter, MD;  Location: [facility];  Service: General;  Laterality: Right;  CLOSURE OF MASTECTOMY INCISION   WISDOM TOOTH EXTRACTION      FAMILY HISTORY :   Family History  Problem Relation Age of Onset  Breast cancer Mother    Diabetes Father     SOCIAL HISTORY:   Social History   Tobacco Use   Smoking status: Never   Smokeless tobacco: Never  Vaping Use   Vaping status: Never Used  Substance Use Topics   Alcohol  use: No   Drug use: No    ALLERGIES:  is allergic to sulfa antibiotics.  MEDICATIONS:  Current Outpatient Medications  Medication Sig Dispense Refill   abemaciclib  (VERZENIO ) 100 MG tablet Take 1 tablet (100 mg total) by mouth  2 (two) times daily. 56 tablet 1   anastrozole  (ARIMIDEX ) 1 MG tablet Take 1 tablet (1 mg total) by mouth daily. 90 tablet 2   atorvastatin  (LIPITOR) 20 MG tablet Take 1 tablet (20 mg total) by mouth daily. 90 tablet 3   carvedilol  (COREG ) 3.125 MG tablet Take 1 tablet (3.125 mg total) by mouth 2 (two) times daily with a meal. 120 tablet 3   Cholecalciferol  50 MCG (2000 UT) CAPS Take 2,000 Units by mouth daily.     clotrimazole -betamethasone  (LOTRISONE ) cream Apply topically daily as needed. 30 g 0   Cranberry-Vitamin C-Probiotic (AZO CRANBERRY PO) Take 1 Capful by mouth in the morning and at bedtime.     cyanocobalamin  (VITAMIN B12) 1000 MCG tablet 1,000 mcg in the morning and at bedtime.     cyclobenzaprine  (FLEXERIL ) 5 MG tablet Take 1 tablet (5 mg total) by mouth 3 (three) times daily as needed for muscle spasms. 60 tablet 0   FARXIGA 10 MG TABS tablet Take 10 mg by mouth daily.     glipiZIDE  (GLUCOTROL  XL) 10 MG 24 hr tablet Take 1 tablet (10 mg total) by mouth 2 (two) times daily. 180 tablet 3   insulin  aspart (NOVOLOG  FLEXPEN) 100 UNIT/ML FlexPen Inject 4 Units into the skin 3 (three) times daily with meals. 15 mL 0   levothyroxine  (SYNTHROID ) 25 MCG tablet Take 1 tablet (25 mcg total) by mouth daily before breakfast. 90 tablet 1   loperamide  (IMODIUM  A-D) 2 MG tablet Take 2 mg by mouth 4 (four) times daily as needed for diarrhea or loose stools.     magnesium  oxide (MAG-OX) 400 MG tablet Take 1 tablet by mouth 2 (two) times daily.     Melatonin 10 MG TABS Take 1 tablet by mouth at bedtime as needed.     Olopatadine  HCl 0.2 % SOLN Apply 1 drop to eye daily as needed.     ondansetron  (ZOFRAN -ODT) 4 MG disintegrating tablet Take 1 tablet (4 mg total) by mouth every 8 (eight) hours as needed for nausea or vomiting. 30 tablet 3   potassium chloride  (KLOR-CON  M) 10 MEQ tablet Take 1 tablet (10 mEq total) by mouth daily. 7 tablet 0   traMADol  (ULTRAM ) 50 MG tablet Take 1 tablet (50 mg total) by  mouth every 8 (eight) hours as needed. 60 tablet 0   promethazine  (PHENERGAN ) 25 MG tablet Take 1 tablet (25 mg total) by mouth every 8 (eight) hours as needed for nausea or vomiting. 15 tablet 0   No current facility-administered medications for this visit.    PHYSICAL EXAMINATION: ECOG PERFORMANCE STATUS: 1 - Symptomatic but completely ambulatory  BP 135/72 (BP Location: Left Arm, Patient Position: Sitting, Cuff Size: Normal)   Pulse 89   Temp (!) 96.2 F (35.7 C) (Tympanic)   Resp 18   Ht 5' 2 (1.575 m)   Wt 149 lb 14.4 oz (68 kg)   SpO2 100%   BMI 27.42  kg/m   Filed Weights   08/08/24 0952  Weight: 149 lb 14.4 oz (68 kg)      Physical Exam Constitutional:      Comments: She is alone.   HENT:     Head: Normocephalic and atraumatic.     Mouth/Throat:     Pharynx: No oropharyngeal exudate.  Eyes:     Pupils: Pupils are equal, round, and reactive to light.  Cardiovascular:     Rate and Rhythm: Normal rate and regular rhythm.  Pulmonary:     Effort: Pulmonary effort is normal. No respiratory distress.     Breath sounds: Normal breath sounds. No wheezing.  Abdominal:     General: Bowel sounds are normal. There is no distension.     Palpations: Abdomen is soft. There is no mass.     Tenderness: There is no abdominal tenderness. There is no guarding or rebound.  Musculoskeletal:        General: No tenderness. Normal range of motion.     Cervical back: Normal range of motion and neck supple.     Comments: Approximately 1 cm hard nodule felt in the anterior chest wall/left parasternal.  Skin:    General: Skin is warm.  Neurological:     Mental Status: She is alert and oriented to person, place, and time.  Psychiatric:        Mood and Affect: Affect normal.    LABORATORY DATA:  I have reviewed the data as listed    Component Value Date/Time   NA 140 08/08/2024 0941   NA 135 11/28/2014 1401   K 3.7 08/08/2024 0941   K 4.7 11/28/2014 1401   CL 104 08/08/2024  0941   CL 99 (L) 11/28/2014 1401   CO2 25 08/08/2024 0941   CO2 28 11/28/2014 1401   GLUCOSE 105 (H) 08/08/2024 0941   GLUCOSE 215 (H) 11/28/2014 1401   BUN 16 08/08/2024 0941   BUN 16 11/28/2014 1401   CREATININE 1.13 (H) 08/08/2024 0941   CREATININE 0.96 11/28/2014 1401   CALCIUM  10.6 (H) 08/08/2024 0941   CALCIUM  9.9 11/28/2014 1401   PROT 7.4 08/08/2024 0941   PROT 7.6 11/28/2014 1401   ALBUMIN 4.2 08/08/2024 0941   ALBUMIN 4.5 11/28/2014 1401   AST 44 (H) 08/08/2024 0941   ALT 39 08/08/2024 0941   ALT 28 11/28/2014 1401   ALKPHOS 95 08/08/2024 0941   ALKPHOS 78 11/28/2014 1401   BILITOT 0.6 08/08/2024 0941   GFRNONAA 52 (L) 08/08/2024 0941   GFRNONAA >60 11/28/2014 1401   GFRAA >60 05/30/2020 1022   GFRAA >60 11/28/2014 1401    No results found for: SPEP, UPEP  Lab Results  Component Value Date   WBC 2.6 (L) 08/08/2024   NEUTROABS 1.2 (L) 08/08/2024   HGB 11.1 (L) 08/08/2024   HCT 32.4 (L) 08/08/2024   MCV 102.2 (H) 08/08/2024   PLT 191 08/08/2024      Chemistry      Component Value Date/Time   NA 140 08/08/2024 0941   NA 135 11/28/2014 1401   K 3.7 08/08/2024 0941   K 4.7 11/28/2014 1401   CL 104 08/08/2024 0941   CL 99 (L) 11/28/2014 1401   CO2 25 08/08/2024 0941   CO2 28 11/28/2014 1401   BUN 16 08/08/2024 0941   BUN 16 11/28/2014 1401   CREATININE 1.13 (H) 08/08/2024 0941   CREATININE 0.96 11/28/2014 1401      Component Value Date/Time   CALCIUM  10.6 (  H) 08/08/2024 0941   CALCIUM  9.9 11/28/2014 1401   ALKPHOS 95 08/08/2024 0941   ALKPHOS 78 11/28/2014 1401   AST 44 (H) 08/08/2024 0941   ALT 39 08/08/2024 0941   ALT 28 11/28/2014 1401   BILITOT 0.6 08/08/2024 0941       RADIOGRAPHIC STUDIES: I have personally reviewed the radiological images as listed and agreed with the findings in the report. No results found.   ASSESSMENT & PLAN:  Carcinoma of lower-outer quadrant of right breast in female, estrogen receptor positive (HCC) #  Recurrent breast cancer-ER positive PR negative ? HER-2/neu-currently on abema+ Anastrozole .   # SEP 30th, 2025- CT CAP- . Status post bilateral mastectomy with implant reconstruction; No evidence of lymphadenopathy or metastatic disease in the chest, abdomen, or pelvis; Subpleural radiation fibrosis of the anterior right lung. Will repeat PET scan given rising cancer marker; and also get Tempus blood test.   # Continue Anastrazole [JAN Mid 2024-]-given improvement of LFTs/and neutropenia-- CONTINUE Verzenio - 100 mg twice daily Monday through Friday- if on going issues with infection/severe neutropenia- recommend lower dose 50 mg- BID.   # Intermittent Hypercalcemia- HOLD off ca+vit D- improved- monitor for now.   # Right Knee pain-  [Emerge Otho]; s/p steroid injection-  stable.   # CKD stage III- stable.   # Hypomagnesia: 1.4- on OTC mag-   stable.  # DM- s/p endocrinology evaluation; with diet and medications [trucilicity; Novolog ]-    stable.  # recurrent UTI/ ? Suprapubic tenderness- S/p eval with urology.  Monitor closely on Abema/with neutropenia- s/p evaluation with Tinnie Dawn re: vaginal atrophy today-    stable.  #Incidental findings on Imaging  CT , 2025:Cholelithiasis; Punctuate nonobstructive left nephrolithiasis; Sigmoid diverticulosis without evidence of acute diverticulitis; Coronary artery diseaseI reviewed/discussed/counseled the patient.   # Low B 12 [PCP]- on B12 PO.   stable.  # Flu shot - 2025  # IV access; PIV.   Q1m; :PREFERS AM appt- NOT Fridays  Mychart- # DISPOSITION: PREFERS AM appt- NOT Fridays- PTH added # Follow-up  1 month--MD-  labs CBC CMP; magnesium ; PET scan; Tempus liquid biopsy- Dr. B  # I reviewed the blood work- with the patient in detail; also reviewed the imaging independently [as summarized above]; and with the patient in detail.         Orders Placed This Encounter  Procedures   NM PET Image Restage (PS) Skull Base to Thigh  (F-18 FDG)    Standing Status:   Future    Expected Date:   09/08/2024    Expiration Date:   08/08/2025    If indicated for the ordered procedure, I authorize the administration of a radiopharmaceutical per Radiology protocol:   Yes    Preferred imaging location?:   [facility]   Parathyroid hormone, intact (no Ca)    Standing Status:   Future    Expected Date:   09/08/2024    Expiration Date:   08/08/2025   CBC with Differential (Cancer Center Only)    Standing Status:   Future    Expected Date:   09/08/2024    Expiration Date:   08/08/2025   CMP (Cancer Center only)    Standing Status:   Future    Expected Date:   09/08/2024    Expiration Date:   08/08/2025   Magnesium     Standing Status:   Future    Expected Date:   09/08/2024    Expiration Date:   08/08/2025  Miscellaneous test (send-out)    Standing Status:   Future    Expected Date:   09/08/2024    Expiration Date:   08/08/2025    Test name / description::   TEMPUS   All questions were answered. The patient knows to call the clinic with any problems, questions or concerns.      Cindy JONELLE Joe, MD 08/08/2024 10:41 AM

## 2024-08-08 NOTE — Patient Instructions (Signed)
#   STOP ca+vit D

## 2024-08-09 DIAGNOSIS — M6281 Muscle weakness (generalized): Secondary | ICD-10-CM | POA: Diagnosis not present

## 2024-08-09 DIAGNOSIS — M25562 Pain in left knee: Secondary | ICD-10-CM | POA: Diagnosis not present

## 2024-08-09 LAB — CANCER ANTIGEN 27.29: CA 27.29: 38 U/mL (ref 0.0–38.6)

## 2024-08-13 ENCOUNTER — Other Ambulatory Visit: Payer: Self-pay

## 2024-08-13 NOTE — Progress Notes (Signed)
 Specialty Pharmacy Refill Coordination Note  Kimberly [name] is a 68 y.o. female contacted today regarding refills of specialty medication(s) Abemaciclib  (VERZENIO )   Patient requested (Patient-Rptd) Delivery   Delivery date: 08/15/24   Verified address: (Patient-Rptd) [address] [place], [place] [address]   Medication will be filled on: 08/14/24

## 2024-08-14 ENCOUNTER — Other Ambulatory Visit: Payer: Self-pay

## 2024-08-27 ENCOUNTER — Ambulatory Visit

## 2024-09-04 ENCOUNTER — Other Ambulatory Visit: Payer: Self-pay

## 2024-09-04 NOTE — Progress Notes (Signed)
 Kimberly [name]                                          MRN: [id number]   09/04/2024   The VBCI Quality Team Specialist reviewed this patient medical record for the purposes of chart review for care gap closure. The following were reviewed: abstraction for care gap closure-controlling blood pressure.    VBCI Quality Team

## 2024-09-05 ENCOUNTER — Other Ambulatory Visit: Payer: Self-pay

## 2024-09-05 ENCOUNTER — Other Ambulatory Visit: Payer: Self-pay | Admitting: Internal Medicine

## 2024-09-05 ENCOUNTER — Ambulatory Visit
Admission: RE | Admit: 2024-09-05 | Discharge: 2024-09-05 | Disposition: A | Discharge status: Home / Self Care | Source: Ambulatory Visit | Attending: Internal Medicine | Admitting: Internal Medicine

## 2024-09-05 DIAGNOSIS — C50511 Malignant neoplasm of lower-outer quadrant of right female breast: Secondary | ICD-10-CM

## 2024-09-05 DIAGNOSIS — C50919 Malignant neoplasm of unspecified site of unspecified female breast: Secondary | ICD-10-CM | POA: Diagnosis present

## 2024-09-05 DIAGNOSIS — I700 Atherosclerosis of aorta: Secondary | ICD-10-CM | POA: Insufficient documentation

## 2024-09-05 DIAGNOSIS — I2510 Atherosclerotic heart disease of native coronary artery without angina pectoris: Secondary | ICD-10-CM | POA: Diagnosis not present

## 2024-09-05 DIAGNOSIS — K8020 Calculus of gallbladder without cholecystitis without obstruction: Secondary | ICD-10-CM | POA: Diagnosis not present

## 2024-09-05 DIAGNOSIS — N200 Calculus of kidney: Secondary | ICD-10-CM | POA: Insufficient documentation

## 2024-09-05 LAB — GLUCOSE, CAPILLARY: Glucose-Capillary: 78 mg/dL (ref 70–99)

## 2024-09-05 MED ORDER — FLUDEOXYGLUCOSE F - 18 (FDG) INJECTION
8.2400 | Freq: Once | INTRAVENOUS | Status: AC | PRN
  Administered 2024-09-05: 8.24 via INTRAVENOUS

## 2024-09-05 NOTE — Progress Notes (Signed)
 Specialty Pharmacy Refill Coordination Note  Kimberly [name] is a 69 y.o. female contacted today regarding refills of specialty medication(s) Abemaciclib  (VERZENIO )   Patient requested Delivery   Delivery date: 09/10/24   Verified address: [address] [place], [place] [address]   Medication will be filled on: 09/07/24    This fill date is pending response to refill request from provider. Patient is aware and if they have not received fill by intended date they must follow up with pharmacy.

## 2024-09-06 ENCOUNTER — Other Ambulatory Visit: Payer: Self-pay

## 2024-09-06 ENCOUNTER — Encounter: Payer: Self-pay | Admitting: Internal Medicine

## 2024-09-06 ENCOUNTER — Other Ambulatory Visit (HOSPITAL_COMMUNITY): Payer: Self-pay

## 2024-09-06 MED ORDER — ABEMACICLIB 100 MG PO TABS
100.0000 mg | ORAL_TABLET | Freq: Two times a day (BID) | ORAL | 1 refills | Status: AC
  Filled 2024-09-06: qty 56, 28d supply, fill #0
  Filled 2024-10-05: qty 56, 28d supply, fill #1

## 2024-09-07 ENCOUNTER — Other Ambulatory Visit: Payer: Self-pay

## 2024-09-10 ENCOUNTER — Inpatient Hospital Stay: Attending: Internal Medicine

## 2024-09-10 ENCOUNTER — Encounter: Payer: Self-pay | Admitting: Internal Medicine

## 2024-09-10 ENCOUNTER — Inpatient Hospital Stay: Admitting: Internal Medicine

## 2024-09-10 VITALS — BP 137/75 | HR 94 | Temp 97.6°F | Resp 18 | Ht 62.0 in | Wt 152.9 lb

## 2024-09-10 DIAGNOSIS — E1122 Type 2 diabetes mellitus with diabetic chronic kidney disease: Secondary | ICD-10-CM | POA: Diagnosis not present

## 2024-09-10 DIAGNOSIS — Z79899 Other long term (current) drug therapy: Secondary | ICD-10-CM | POA: Insufficient documentation

## 2024-09-10 DIAGNOSIS — N183 Chronic kidney disease, stage 3 unspecified: Secondary | ICD-10-CM | POA: Insufficient documentation

## 2024-09-10 DIAGNOSIS — C7800 Secondary malignant neoplasm of unspecified lung: Secondary | ICD-10-CM | POA: Insufficient documentation

## 2024-09-10 DIAGNOSIS — Z794 Long term (current) use of insulin: Secondary | ICD-10-CM | POA: Insufficient documentation

## 2024-09-10 DIAGNOSIS — Z79811 Long term (current) use of aromatase inhibitors: Secondary | ICD-10-CM | POA: Diagnosis not present

## 2024-09-10 DIAGNOSIS — C50511 Malignant neoplasm of lower-outer quadrant of right female breast: Secondary | ICD-10-CM | POA: Diagnosis not present

## 2024-09-10 DIAGNOSIS — Z170 Estrogen receptor positive status [ER+]: Secondary | ICD-10-CM

## 2024-09-10 DIAGNOSIS — Z923 Personal history of irradiation: Secondary | ICD-10-CM | POA: Insufficient documentation

## 2024-09-10 DIAGNOSIS — Z803 Family history of malignant neoplasm of breast: Secondary | ICD-10-CM | POA: Insufficient documentation

## 2024-09-10 DIAGNOSIS — Z1721 Progesterone receptor positive status: Secondary | ICD-10-CM | POA: Diagnosis not present

## 2024-09-10 DIAGNOSIS — Z7984 Long term (current) use of oral hypoglycemic drugs: Secondary | ICD-10-CM | POA: Diagnosis not present

## 2024-09-10 DIAGNOSIS — Z9013 Acquired absence of bilateral breasts and nipples: Secondary | ICD-10-CM | POA: Insufficient documentation

## 2024-09-10 DIAGNOSIS — I129 Hypertensive chronic kidney disease with stage 1 through stage 4 chronic kidney disease, or unspecified chronic kidney disease: Secondary | ICD-10-CM | POA: Insufficient documentation

## 2024-09-10 DIAGNOSIS — Z9221 Personal history of antineoplastic chemotherapy: Secondary | ICD-10-CM | POA: Insufficient documentation

## 2024-09-10 LAB — CBC WITH DIFFERENTIAL (CANCER CENTER ONLY)
Abs Immature Granulocytes: 0.01 K/uL (ref 0.00–0.07)
Basophils Absolute: 0.1 K/uL (ref 0.0–0.1)
Basophils Relative: 2 %
Eosinophils Absolute: 0.1 K/uL (ref 0.0–0.5)
Eosinophils Relative: 3 %
HCT: 32.6 % — ABNORMAL LOW (ref 36.0–46.0)
Hemoglobin: 11 g/dL — ABNORMAL LOW (ref 12.0–15.0)
Immature Granulocytes: 0 %
Lymphocytes Relative: 38 %
Lymphs Abs: 1 K/uL (ref 0.7–4.0)
MCH: 34.1 pg — ABNORMAL HIGH (ref 26.0–34.0)
MCHC: 33.7 g/dL (ref 30.0–36.0)
MCV: 100.9 fL — ABNORMAL HIGH (ref 80.0–100.0)
Monocytes Absolute: 0.2 K/uL (ref 0.1–1.0)
Monocytes Relative: 8 %
Neutro Abs: 1.2 K/uL — ABNORMAL LOW (ref 1.7–7.7)
Neutrophils Relative %: 49 %
Platelet Count: 216 K/uL (ref 150–400)
RBC: 3.23 MIL/uL — ABNORMAL LOW (ref 3.87–5.11)
RDW: 13.2 % (ref 11.5–15.5)
WBC Count: 2.5 K/uL — ABNORMAL LOW (ref 4.0–10.5)
nRBC: 0 % (ref 0.0–0.2)

## 2024-09-10 LAB — CMP (CANCER CENTER ONLY)
ALT: 30 U/L (ref 0–44)
AST: 41 U/L (ref 15–41)
Albumin: 3.9 g/dL (ref 3.5–5.0)
Alkaline Phosphatase: 145 U/L — ABNORMAL HIGH (ref 38–126)
Anion gap: 11 (ref 5–15)
BUN: 16 mg/dL (ref 8–23)
CO2: 24 mmol/L (ref 22–32)
Calcium: 10.2 mg/dL (ref 8.9–10.3)
Chloride: 102 mmol/L (ref 98–111)
Creatinine: 0.93 mg/dL (ref 0.44–1.00)
GFR, Estimated: >60 mL/min
Glucose, Bld: 211 mg/dL — ABNORMAL HIGH (ref 70–99)
Potassium: 3.7 mmol/L (ref 3.5–5.1)
Sodium: 137 mmol/L (ref 135–145)
Total Bilirubin: 0.5 mg/dL (ref 0.0–1.2)
Total Protein: 7 g/dL (ref 6.5–8.1)

## 2024-09-10 LAB — MAGNESIUM: Magnesium: 1.5 mg/dL — ABNORMAL LOW (ref 1.7–2.4)

## 2024-09-10 LAB — MISCELLANEOUS TEST

## 2024-09-10 NOTE — Assessment & Plan Note (Signed)
#   Recurrent breast cancer-ER positive PR negative ? HER-2/neu-currently on abema+ Anastrozole . # PET scan - 1/09/20256-  No evidence of tracer avid metastatic disease, status post right mastectomy and bilateral breast reconstruction. Pending- Tempus blood test.   # Continue Anastrazole [JAN Mid 2024-]-given improvement of LFTs/and neutropenia-- CONTINUE Verzenio - 100 mg twice daily Monday through Friday- if on going issues with infection/severe neutropenia- recommend lower dose 50 mg- BID. Stable.   # Intermittent Hypercalcemia- HOLD off ca+vit D- improved- monitor for now. Stable.  # Right Knee pain-  [Emerge Otho]; s/p steroid injection-  stable.   # CKD stage III- stable.   # Hypomagnesia: 1.4- on OTC mag- stable.   # DM- s/p endocrinology evaluation; with diet and medications [trucilicity; Novolog ]-   stable.   # recurrent UTI/ ? Suprapubic tenderness- S/p eval with urology.  Monitor closely on Abema/with neutropenia- s/p evaluation with Tinnie Dawn re: vaginal atrophy today-    stable.  #Incidental findings on Imaging  CT , 2025:Cholelithiasis; Punctuate nonobstructive left nephrolithiasis; Sigmoid diverticulosis without evidence of acute diverticulitis; Coronary artery diseaseI reviewed/discussed/counseled the patient.   # Low B 12 [PCP]- on B12 PO.   stable.  # Flu shot - 2025  # IV access; PIV.   Q1m; :PREFERS AM appt- NOT Fridays  Mychart- # DISPOSITION: PREFERS AM appt- NOT Fridays # Follow-up  1 month--MD-  labs CBC CMP; magnesium ; Dr. KATHEE

## 2024-09-10 NOTE — Progress Notes (Signed)
 PET 09/05/24. She would like you to explain Tempus. Can her appts be moved back to mornings except Fridays?

## 2024-09-10 NOTE — Progress Notes (Signed)
 [name] OFFICE PROGRESS NOTE  Patient Care Team: Steva Clotilda DEL, NP as PCP - General (Family Medicine) Merrilyn Handler, MD (Inactive) (General Surgery) Curlene Agent, MD (Obstetrics and Gynecology) Lenn Aran, MD Wilder Pink, MD (Unknown Physician Specialty) Rennie Cindy SAUNDERS, MD as Consulting Physician (Oncology)   Cancer Staging  Carcinoma of lower-outer quadrant of right breast in female, estrogen receptor positive [facility]) Staging form: Breast, AJCC 7th Edition - Pathologic: ypT1c,ypN2a, MX - Signed by Merrilyn Handler PARAS, MD on 03/10/2012 Cancer stage: ypT1c,ypN2a, MX    Oncology History Overview Note  # DEC 2012- RIGHT BREAST CA T2 N2 M0 tumor from biopsy.  Estrogen receptor positive, Progesterone receptor positive.  Current receptor negative by FISH 2. Neoadjuvant chemotherapy started in December of 28 with Cytoxan Adriamycin 3. Started on Taxol weekly chemotherapy. 4. Patient finished 12  cycles of Taxol chemotherapy in May of 2013.     5. Status post right modified radical mastectomy [Dr.Bowers; [place]] June of 2013, ypT1c  yp N2  MO. started also on Lerazole    7. Radiation therapy to the right breast (September of 2013).  Lymph node was positive for HER-2 receptor gene amplification of 2.22.  Will proceed with Herceptin treatment starting in September of 2013.   8.Patient has finished Herceptin (maintenance therapy) in August of 2014 8. Start patient on letrozole  from November, 2013. 9. Patient started on Herceptin in September 2013.    10Patient finished 12 months of Herceptin therapy on August, 2014  # 6. Status post left side prophylactic mastectomy.  #Late MAY 2018-RECURRENCE BREAST CA- ER positive/PR negative; ?? HER-2/neu- [biopsy- proven-mediastinal lymph node; in suff for her 2 testing].  [elevated Tumor marker- CT/PET- uptake in Right Mediastinal LN; Sternum [June 2018 EBUS- Dr.Kasa]   # March 10 2017- faslodex  + Abema; OCT 5th  CT-PR of mediastinal LN [consent]; Discontinued faslodex  sec to insurance issues [last DEC 2023]. JAN 2024- continue Abema + Anastrzole   #August 2020-left chest wall nodule biopsy benign  #August 2019- DVT-right upper extremity; Eliquis ; stop end of March 2021 [repeat ultrasound negative/patient preference]; # JAN 2022-Cirrhosis- ? On CT scan-FEB liver- Negative fro overt cirrhosis --------------------------------------------------------------- -    DIAGNOSIS: [ BREAST CANCER- ER/PR/HER2 NEU POS  STAGE:  4   ;GOALS: PALLIATIVE     Carcinoma of lower-outer quadrant of right breast in female, estrogen receptor positive (HCC)   INTERVAL HISTORY: Alone.  Ambulating independently.  [name] 69 y.o.  female pleasant patient above history of metastatic ER PR positive HER-2/neu positive breast cancer currently on abema [M-F; no weekends]+ Anastrazole  is here for follow-up/ and PET-CT scan.   Discussed the use of AI scribe software for clinical note transcription with the patient, who gave verbal consent to proceed.  History of Present Illness   Kim[name] is a 69 year old female with metastatic estrogen receptor positive right breast cancer who presents for routine oncology follow-up and review of recent imaging and laboratory results.  She was initially treated with intravenous chemotherapy and radiation in 2012-2013, followed by Faslodex  injections until January 2024. She has been maintained on oral Verzenio  since 2018 and transitioned to oral therapy approximately two years ago, with discontinuation of Faslodex  at that time.  The patient reports that her recent PET scan did not show any evidence of cancer, and that the previously noted right lung and two left lung lesions are now gone. Tumor markers have fluctuated over the past four months, rising from 27 to 40  and most recently at 6, though imaging remains negative for disease recurrence. She recently underwent a  blood Tempus test and is awaiting the results.  She has no new symptoms or acute complaints. She denies recent urinary tract infections. She is currently holding vitamin D3 and either magnesium  or calcium  supplementation pending laboratory results, as previously instructed. She prefers morning appointments, excluding Fridays.      Review of Systems  Constitutional:  Positive for malaise/fatigue. Negative for chills, diaphoresis, fever and weight loss.  HENT:  Negative for nosebleeds and sore throat.   Eyes:  Negative for double vision.  Respiratory:  Negative for cough, hemoptysis, sputum production, shortness of breath and wheezing.   Cardiovascular:  Negative for chest pain, palpitations, orthopnea and leg swelling.  Gastrointestinal:  Negative for abdominal pain, blood in stool, constipation, heartburn, melena, nausea and vomiting.  Musculoskeletal:  Positive for back pain and joint pain.  Neurological:  Negative for dizziness, tingling, focal weakness, weakness and headaches.  Endo/Heme/Allergies:  Does not bruise/bleed easily.  Psychiatric/Behavioral:  Negative for depression. The patient is not nervous/anxious and does not have insomnia.       PAST MEDICAL HISTORY :  Past Medical History:  Diagnosis Date   Arthritis    Cancer of lower-outer quadrant of female breast (HCC) 08/06/2011   RIGHT, chemo and bilateral mastectomies.   High cholesterol    History of kidney stones    Hx of bilateral breast implants    Hypertension    Lung metastases 20118   chemo pills and faslidex shots.   PONV (postoperative nausea and vomiting)    Type II diabetes mellitus (HCC)    fasting 140-150   Vertigo    LAST WEEK   Vertigo 01/2017    PAST SURGICAL HISTORY :   Past Surgical History:  Procedure Laterality Date   BREAST BIOPSY  07/2011, 2020   right   BREAST RECONSTRUCTION  03/07/2012   Procedure: BREAST RECONSTRUCTION;  Surgeon: Alm Sick, MD;  Location: [name] OR;  Service: Plastics;   Laterality: Left;  BREAST RECONSTRUCTION WITH PLACEMENT OF TISSUE EXPANDER TO LEFT BREAST   BREAST RECONSTRUCTION  02/06/2013   CESAREAN SECTION  [id number]   ENDOBRONCHIAL ULTRASOUND N/A 02/24/2017   Procedure: ENDOBRONCHIAL ULTRASOUND;  Surgeon: Isaiah Scrivener, MD;  Location: [facility] ORS;  Service: Cardiopulmonary;  Laterality: N/A;   LATISSIMUS FLAP TO BREAST Right 02/06/2013   Procedure: LATISSIMUS FLAP TO RIGHT BREAST WITH IMPLANT;  Surgeon: Alm Sick, MD;  Location[name] OR;  Service: Plastics;  Laterality: Right;   MASTECTOMY  03/07/12   modified right; total left   MODIFIED MASTECTOMY  03/07/2012   Procedure: MODIFIED MASTECTOMY;  Surgeon: Sherlean JINNY Laughter, MD;  Location: [facility] OR;  Service: General;  Laterality: Right;   PORTACATH PLACEMENT  07/2011   SCAR REVISION  03/30/2012   Procedure: SCAR REVISION;  Surgeon: Sherlean JINNY Laughter, MD;  Location: [name];  Service: General;  Laterality: Right;  CLOSURE OF MASTECTOMY INCISION   WISDOM TOOTH EXTRACTION      FAMILY HISTORY :   Family History  Problem Relation Age of Onset   Breast cancer Mother    Diabetes Father     SOCIAL HISTORY:   Social History   Tobacco Use   Smoking status: Never   Smokeless tobacco: Never  Vaping Use   Vaping status: Never Used  Substance Use Topics   Alcohol  use: No   Drug use: No    ALLERGIES:  is allergic to sulfa  antibiotics.  MEDICATIONS:  Current Outpatient Medications  Medication Sig Dispense Refill   abemaciclib  (VERZENIO ) 100 MG tablet Take 1 tablet (100 mg total) by mouth 2 (two) times daily. 56 tablet 1   anastrozole  (ARIMIDEX ) 1 MG tablet Take 1 tablet (1 mg total) by mouth daily. 90 tablet 2   atorvastatin  (LIPITOR) 20 MG tablet Take 1 tablet (20 mg total) by mouth daily. 90 tablet 3   carvedilol  (COREG ) 3.125 MG tablet Take 1 tablet (3.125 mg total) by mouth 2 (two) times daily with a meal. 120 tablet 3   clotrimazole -betamethasone  (LOTRISONE ) cream Apply topically daily  as needed. 30 g 0   Cranberry-Vitamin C-Probiotic (AZO CRANBERRY PO) Take 1 Capful by mouth in the morning and at bedtime.     cyanocobalamin  (VITAMIN B12) 1000 MCG tablet 1,000 mcg in the morning and at bedtime.     cyclobenzaprine  (FLEXERIL ) 5 MG tablet Take 1 tablet (5 mg total) by mouth 3 (three) times daily as needed for muscle spasms. 60 tablet 0   FARXIGA 10 MG TABS tablet Take 10 mg by mouth daily.     glipiZIDE  (GLUCOTROL  XL) 10 MG 24 hr tablet Take 1 tablet (10 mg total) by mouth 2 (two) times daily. 180 tablet 3   insulin  aspart (NOVOLOG  FLEXPEN) 100 UNIT/ML FlexPen Inject 4 Units into the skin 3 (three) times daily with meals. 15 mL 0   levothyroxine  (SYNTHROID ) 25 MCG tablet Take 1 tablet (25 mcg total) by mouth daily before breakfast. 90 tablet 1   loperamide  (IMODIUM  A-D) 2 MG tablet Take 2 mg by mouth 4 (four) times daily as needed for diarrhea or loose stools.     Melatonin 10 MG TABS Take 1 tablet by mouth at bedtime as needed.     Olopatadine  HCl 0.2 % SOLN Apply 1 drop to eye daily as needed.     ondansetron  (ZOFRAN -ODT) 4 MG disintegrating tablet Take 1 tablet (4 mg total) by mouth every 8 (eight) hours as needed for nausea or vomiting. 30 tablet 3   potassium chloride  (KLOR-CON  M) 10 MEQ tablet Take 1 tablet (10 mEq total) by mouth daily. 7 tablet 0   promethazine  (PHENERGAN ) 25 MG tablet Take 1 tablet (25 mg total) by mouth every 8 (eight) hours as needed for nausea or vomiting. 15 tablet 0   traMADol  (ULTRAM ) 50 MG tablet Take 1 tablet (50 mg total) by mouth every 8 (eight) hours as needed. 60 tablet 0   Cholecalciferol  50 MCG (2000 UT) CAPS Take 2,000 Units by mouth daily. (Patient not taking: Reported on 09/10/2024)     magnesium  oxide (MAG-OX) 400 MG tablet Take 1 tablet by mouth 2 (two) times daily. (Patient not taking: Reported on 09/10/2024)     No current facility-administered medications for this visit.    PHYSICAL EXAMINATION: ECOG PERFORMANCE STATUS: 1 -  Symptomatic but completely ambulatory  BP 137/75 (BP Location: Left Arm, Patient Position: Sitting, Cuff Size: Normal)   Pulse 94   Temp 97.6 F (36.4 C) (Tympanic)   Resp 18   Ht 5' 2 (1.575 m)   Wt 152 lb 14.4 oz (69.4 kg)   SpO2 100%   BMI 27.97 kg/m   Filed Weights   09/10/24 1417  Weight: 152 lb 14.4 oz (69.4 kg)      Physical Exam Constitutional:      Comments: She is alone.   HENT:     Head: Normocephalic and atraumatic.     Mouth/Throat:  Pharynx: No oropharyngeal exudate.  Eyes:     Pupils: Pupils are equal, round, and reactive to light.  Cardiovascular:     Rate and Rhythm: Normal rate and regular rhythm.  Pulmonary:     Effort: Pulmonary effort is normal. No respiratory distress.     Breath sounds: Normal breath sounds. No wheezing.  Abdominal:     General: Bowel sounds are normal. There is no distension.     Palpations: Abdomen is soft. There is no mass.     Tenderness: There is no abdominal tenderness. There is no guarding or rebound.  Musculoskeletal:        General: No tenderness. Normal range of motion.     Cervical back: Normal range of motion and neck supple.     Comments: Approximately 1 cm hard nodule felt in the anterior chest wall/left parasternal.  Skin:    General: Skin is warm.  Neurological:     Mental Status: She is alert and oriented to person, place, and time.  Psychiatric:        Mood and Affect: Affect normal.    LABORATORY DATA:  I have reviewed the data as listed    Component Value Date/Time   NA 140 08/08/2024 0941   NA 135 11/28/2014 1401   K 3.7 08/08/2024 0941   K 4.7 11/28/2014 1401   CL 104 08/08/2024 0941   CL 99 (L) 11/28/2014 1401   CO2 25 08/08/2024 0941   CO2 28 11/28/2014 1401   GLUCOSE 105 (H) 08/08/2024 0941   GLUCOSE 215 (H) 11/28/2014 1401   BUN 16 08/08/2024 0941   BUN 16 11/28/2014 1401   CREATININE 1.13 (H) 08/08/2024 0941   CREATININE 0.96 11/28/2014 1401   CALCIUM  10.6 (H) 08/08/2024 0941    CALCIUM  9.9 11/28/2014 1401   PROT 7.4 08/08/2024 0941   PROT 7.6 11/28/2014 1401   ALBUMIN 4.2 08/08/2024 0941   ALBUMIN 4.5 11/28/2014 1401   AST 44 (H) 08/08/2024 0941   ALT 39 08/08/2024 0941   ALT 28 11/28/2014 1401   ALKPHOS 95 08/08/2024 0941   ALKPHOS 78 11/28/2014 1401   BILITOT 0.6 08/08/2024 0941   GFRNONAA 52 (L) 08/08/2024 0941   GFRNONAA >60 11/28/2014 1401   GFRAA >60 05/30/2020 1022   GFRAA >60 11/28/2014 1401    No results found for: SPEP, UPEP  Lab Results  Component Value Date   WBC 2.5 (L) 09/10/2024   NEUTROABS 1.2 (L) 09/10/2024   HGB 11.0 (L) 09/10/2024   HCT 32.6 (L) 09/10/2024   MCV 100.9 (H) 09/10/2024   PLT 216 09/10/2024      Chemistry      Component Value Date/Time   NA 140 08/08/2024 0941   NA 135 11/28/2014 1401   K 3.7 08/08/2024 0941   K 4.7 11/28/2014 1401   CL 104 08/08/2024 0941   CL 99 (L) 11/28/2014 1401   CO2 25 08/08/2024 0941   CO2 28 11/28/2014 1401   BUN 16 08/08/2024 0941   BUN 16 11/28/2014 1401   CREATININE 1.13 (H) 08/08/2024 0941   CREATININE 0.96 11/28/2014 1401      Component Value Date/Time   CALCIUM  10.6 (H) 08/08/2024 0941   CALCIUM  9.9 11/28/2014 1401   ALKPHOS 95 08/08/2024 0941   ALKPHOS 78 11/28/2014 1401   AST 44 (H) 08/08/2024 0941   ALT 39 08/08/2024 0941   ALT 28 11/28/2014 1401   BILITOT 0.6 08/08/2024 0941       RADIOGRAPHIC STUDIES:  I have personally reviewed the radiological images as listed and agreed with the findings in the report. No results found.   ASSESSMENT & PLAN:  Carcinoma of lower-outer quadrant of right breast in female, estrogen receptor positive (HCC) # Recurrent breast cancer-ER positive PR negative ? HER-2/neu-currently on abema+ Anastrozole . # PET scan - 1/09/20256-  No evidence of tracer avid metastatic disease, status post right mastectomy and bilateral breast reconstruction. Pending- Tempus blood test.   # Continue Anastrazole [JAN Mid 2024-]-given improvement of  LFTs/and neutropenia-- CONTINUE Verzenio - 100 mg twice daily Monday through Friday- if on going issues with infection/severe neutropenia- recommend lower dose 50 mg- BID. Stable.   # Intermittent Hypercalcemia- HOLD off ca+vit D- improved- monitor for now. Stable.  # Right Knee pain-  [Emerge Otho]; s/p steroid injection-  stable.   # CKD stage III- stable.   # Hypomagnesia: 1.4- on OTC mag- stable.   # DM- s/p endocrinology evaluation; with diet and medications [trucilicity; Novolog ]-   stable.   # recurrent UTI/ ? Suprapubic tenderness- S/p eval with urology.  Monitor closely on Abema/with neutropenia- s/p evaluation with Tinnie Dawn re: vaginal atrophy today-    stable.  #Incidental findings on Imaging  CT , 2025:Cholelithiasis; Punctuate nonobstructive left nephrolithiasis; Sigmoid diverticulosis without evidence of acute diverticulitis; Coronary artery diseaseI reviewed/discussed/counseled the patient.   # Low B 12 [PCP]- on B12 PO.   stable.  # Flu shot - 2025  # IV access; PIV.   Q1m; :PREFERS AM appt- NOT Fridays  Mychart- # DISPOSITION: PREFERS AM appt- NOT Fridays # Follow-up  1 month--MD-  labs CBC CMP; magnesium ; Dr. B    No orders of the defined types were placed in this encounter.  All questions were answered. The patient knows to call the clinic with any problems, questions or concerns.      Cindy JONELLE Joe, MD 09/10/2024 3:04 PM

## 2024-09-11 LAB — PARATHYROID HORMONE, INTACT (NO CA): PTH: 20 pg/mL (ref 15–65)

## 2024-09-24 ENCOUNTER — Other Ambulatory Visit: Payer: Self-pay

## 2024-09-24 ENCOUNTER — Other Ambulatory Visit: Payer: Self-pay | Admitting: Pharmacist

## 2024-09-24 NOTE — Progress Notes (Signed)
 Specialty Pharmacy Ongoing Clinical Assessment Note  Kimberly [name] is a 69 y.o. female who is being followed by the specialty pharmacy service for RxSp Oncology   Patient's specialty medication(s) reviewed today: Abemaciclib  (VERZENIO )   Missed doses in the last 4 weeks: 0   Patient/Caregiver did not have any additional questions or concerns.   Therapeutic benefit summary: Patient is achieving benefit   Adverse events/side effects summary: No adverse events/side effects   Patient's therapy is appropriate to: Continue    Goals Addressed             This Visit's Progress    Slow Disease Progression   On track    Patient is on track. Patient will maintain adherence. Patient to have updated scan prior to visit in September.         Follow up: 6 months  Lyle LELON Chalk Specialty Pharmacist

## 2024-10-05 ENCOUNTER — Other Ambulatory Visit: Payer: Self-pay

## 2024-10-05 NOTE — Progress Notes (Signed)
 Specialty Pharmacy Refill Coordination Note  Kimberly [name] is a 69 y.o. female contacted today regarding refills of specialty medication(s) Abemaciclib  (VERZENIO )   Patient requested Delivery   Delivery date: 10/09/24   Verified address: [address]  [place], [place] [address]   Medication will be filled on: 10/08/24

## 2024-10-11 ENCOUNTER — Inpatient Hospital Stay

## 2024-10-11 ENCOUNTER — Inpatient Hospital Stay: Admitting: Internal Medicine
